# Patient Record
Sex: Male | Born: 1935 | Race: White | Hispanic: No | Marital: Married | Smoking: Never smoker
Health system: Southern US, Community
[De-identification: ages and names within clinical notes are randomized; demographics above are authoritative.]

## PROBLEM LIST (undated history)

## (undated) DIAGNOSIS — E785 Hyperlipidemia, unspecified: Secondary | ICD-10-CM

## (undated) DIAGNOSIS — M1990 Unspecified osteoarthritis, unspecified site: Secondary | ICD-10-CM

## (undated) DIAGNOSIS — R7881 Bacteremia: Secondary | ICD-10-CM

## (undated) DIAGNOSIS — I10 Essential (primary) hypertension: Secondary | ICD-10-CM

## (undated) DIAGNOSIS — M5090 Cervical disc disorder, unspecified, unspecified cervical region: Secondary | ICD-10-CM

## (undated) DIAGNOSIS — I351 Nonrheumatic aortic (valve) insufficiency: Secondary | ICD-10-CM

## (undated) DIAGNOSIS — I639 Cerebral infarction, unspecified: Secondary | ICD-10-CM

## (undated) DIAGNOSIS — N400 Enlarged prostate without lower urinary tract symptoms: Secondary | ICD-10-CM

## (undated) DIAGNOSIS — M5412 Radiculopathy, cervical region: Secondary | ICD-10-CM

## (undated) DIAGNOSIS — I63239 Cerebral infarction due to unspecified occlusion or stenosis of unspecified carotid arteries: Secondary | ICD-10-CM

## (undated) HISTORY — PX: CHOLECYSTECTOMY: SHX55

## (undated) HISTORY — DX: Essential (primary) hypertension: I10

## (undated) HISTORY — PX: KIDNEY STONE SURGERY: SHX686

## (undated) HISTORY — DX: Hyperlipidemia, unspecified: E78.5

## (undated) HISTORY — PX: BACK SURGERY: SHX140

## (undated) HISTORY — PX: EYE SURGERY: SHX253

## (undated) HISTORY — PX: CAROTID ENDARTERECTOMY: SUR193

---

## 2004-05-01 ENCOUNTER — Ambulatory Visit: Payer: Self-pay | Admitting: Family Medicine

## 2005-04-22 ENCOUNTER — Ambulatory Visit: Payer: Self-pay | Admitting: Family Medicine

## 2005-04-25 ENCOUNTER — Ambulatory Visit: Payer: Self-pay | Admitting: Family Medicine

## 2005-04-28 ENCOUNTER — Ambulatory Visit: Payer: Self-pay | Admitting: Family Medicine

## 2005-09-29 ENCOUNTER — Ambulatory Visit: Payer: Self-pay | Admitting: Family Medicine

## 2005-12-02 ENCOUNTER — Ambulatory Visit: Payer: Self-pay | Admitting: Family Medicine

## 2005-12-16 ENCOUNTER — Ambulatory Visit: Payer: Self-pay | Admitting: Family Medicine

## 2006-05-18 ENCOUNTER — Ambulatory Visit: Payer: Self-pay | Admitting: Family Medicine

## 2006-05-19 ENCOUNTER — Ambulatory Visit: Payer: Self-pay | Admitting: Family Medicine

## 2009-04-20 ENCOUNTER — Ambulatory Visit (HOSPITAL_COMMUNITY): Admission: RE | Admit: 2009-04-20 | Discharge: 2009-04-21 | Payer: Self-pay | Admitting: Neurosurgery

## 2010-01-15 ENCOUNTER — Emergency Department (HOSPITAL_COMMUNITY): Admission: EM | Admit: 2010-01-15 | Discharge: 2010-01-15 | Payer: Self-pay | Admitting: Emergency Medicine

## 2010-09-26 LAB — TYPE AND SCREEN
ABO/RH(D): O POS
Antibody Screen: NEGATIVE

## 2010-09-26 LAB — BASIC METABOLIC PANEL
BUN: 9 mg/dL (ref 6–23)
Calcium: 8.6 mg/dL (ref 8.4–10.5)
Creatinine, Ser: 0.83 mg/dL (ref 0.4–1.5)
GFR calc Af Amer: >60 mL/min (ref >60)
Glucose, Bld: 89 mg/dL (ref 70–99)
Potassium: 3.9 mEq/L (ref 3.5–5.1)

## 2010-09-26 LAB — CBC
Hemoglobin: 14.3 g/dL (ref 13.0–17.0)
MCHC: 34 g/dL (ref 30.0–36.0)
RBC: 4.44 MIL/uL (ref 4.22–5.81)

## 2010-09-26 LAB — DIFFERENTIAL
Basophils Relative: 0 % (ref 0–1)
Eosinophils Absolute: 0.1 10*3/uL (ref 0.0–0.7)
Lymphs Abs: 1.8 10*3/uL (ref 0.7–4.0)
Monocytes Absolute: 0.6 10*3/uL (ref 0.1–1.0)

## 2011-09-22 DIAGNOSIS — I779 Disorder of arteries and arterioles, unspecified: Secondary | ICD-10-CM | POA: Insufficient documentation

## 2012-08-06 ENCOUNTER — Encounter (HOSPITAL_COMMUNITY): Payer: Self-pay

## 2012-08-06 ENCOUNTER — Emergency Department (HOSPITAL_COMMUNITY): Payer: Medicare Other

## 2012-08-06 ENCOUNTER — Emergency Department (HOSPITAL_COMMUNITY)
Admission: EM | Admit: 2012-08-06 | Discharge: 2012-08-06 | Disposition: A | Discharge status: Home / Self Care | Payer: Medicare Other | Attending: Emergency Medicine | Admitting: Emergency Medicine

## 2012-08-06 DIAGNOSIS — R531 Weakness: Secondary | ICD-10-CM

## 2012-08-06 DIAGNOSIS — R509 Fever, unspecified: Secondary | ICD-10-CM | POA: Insufficient documentation

## 2012-08-06 DIAGNOSIS — D72829 Elevated white blood cell count, unspecified: Secondary | ICD-10-CM | POA: Insufficient documentation

## 2012-08-06 DIAGNOSIS — R5381 Other malaise: Secondary | ICD-10-CM | POA: Insufficient documentation

## 2012-08-06 DIAGNOSIS — Z8673 Personal history of transient ischemic attack (TIA), and cerebral infarction without residual deficits: Secondary | ICD-10-CM | POA: Insufficient documentation

## 2012-08-06 HISTORY — DX: Cerebral infarction, unspecified: I63.9

## 2012-08-06 LAB — URINALYSIS, ROUTINE W REFLEX MICROSCOPIC
Bilirubin Urine: NEGATIVE
Urobilinogen, UA: 0.2 mg/dL (ref 0.0–1.0)

## 2012-08-06 LAB — COMPREHENSIVE METABOLIC PANEL
Albumin: 4 g/dL (ref 3.5–5.2)
Alkaline Phosphatase: 66 U/L (ref 39–117)
CO2: 27 mEq/L (ref 19–32)
Calcium: 9.2 mg/dL (ref 8.4–10.5)
Chloride: 97 mEq/L (ref 96–112)
GFR calc Af Amer: 85 mL/min — ABNORMAL LOW (ref >90)
Glucose, Bld: 128 mg/dL — ABNORMAL HIGH (ref 70–99)
Potassium: 4.1 mEq/L (ref 3.5–5.1)

## 2012-08-06 LAB — CBC WITH DIFFERENTIAL/PLATELET
Basophils Absolute: 0 10*3/uL (ref 0.0–0.1)
Basophils Relative: 0 % (ref 0–1)
HCT: 41.8 % (ref 39.0–52.0)
MCH: 30.5 pg (ref 26.0–34.0)
MCHC: 33.3 g/dL (ref 30.0–36.0)
Monocytes Absolute: 1.2 10*3/uL — ABNORMAL HIGH (ref 0.1–1.0)
Platelets: 158 10*3/uL (ref 150–400)
RDW: 13 % (ref 11.5–15.5)

## 2012-08-06 LAB — URINE MICROSCOPIC-ADD ON

## 2012-08-06 MED ORDER — ACETAMINOPHEN 500 MG PO TABS
1000.0000 mg | ORAL_TABLET | Freq: Once | ORAL | Status: AC
  Administered 2012-08-06: 1000 mg via ORAL
  Filled 2012-08-06: qty 2

## 2012-08-06 MED ORDER — SODIUM CHLORIDE 0.9 % IV BOLUS (SEPSIS)
500.0000 mL | Freq: Once | INTRAVENOUS | Status: AC
  Administered 2012-08-06: 1000 mL via INTRAVENOUS

## 2012-08-06 NOTE — ED Notes (Signed)
Ambulated patient to the nurses desk and back. Patient needed little assistance.

## 2012-08-06 NOTE — ED Notes (Signed)
Pt tolerated p.o intake with no difficulty.  Reporting that he does feel slightly better after eating.  IV bolus continues to infuse.

## 2012-08-06 NOTE — ED Notes (Signed)
To be discharged, notable hard for patient to sit up in bed. EDP made aware patient to be ambulated around nurses station.

## 2012-08-06 NOTE — ED Provider Notes (Signed)
History     CSN: [id number]  Arrival date & time 08/06/12  1617   First MD Initiated Contact with Patient 08/06/12 1619      Chief Complaint  Patient presents with  . Weakness    (Consider location/radiation/quality/duration/timing/severity/associated sxs/prior treatment) HPI Comments: 79 year old male history of stroke and a carotid endarterectomy on the right who presents with a complaint of generalized weakness. He states that yesterday he was able to get out of the house and shovel snow and today when he woke up he was feeling a bit tired, when he got out of the house to go to the car with his wife he felt severely weak and lowered to the ground, unable to get up. He denies any focal weakness or numbness and has no difficulty with speech or with vision. The symptoms are persistent, moderate to severe, nothing seems to make it better or worse. He has had no food and no fluids today, he denies fevers chills, cough, chest pain, back pain, abdominal pain, dysuria, diarrhea, swelling, rashes. He does admit to having nausea today.  Patient is a 79 y.o. male presenting with weakness. The history is provided by the patient and the EMS personnel.  Weakness    Past Medical History  Diagnosis Date  . Stroke     Past Surgical History  Procedure Laterality Date  . Carotid endarterectomy      No family history on file.  History  Substance Use Topics  . Smoking status: Never Smoker   . Smokeless tobacco: Not on file  . Alcohol Use: No      Review of Systems  Neurological: Positive for weakness.  All other systems reviewed and are negative.    Allergies  Review of patient's allergies indicates not on file.  Home Medications   Current Outpatient Rx  Name  Route  Sig  Dispense  Refill  . acetaminophen (TYLENOL EX ST ARTHRITIS PAIN) 500 MG tablet   Oral   Take 500 mg by mouth daily as needed for pain.         . Tamsulosin HCl (FLOMAX) 0.4 MG CAPS   Oral   Take 0.4 mg  by mouth every evening.           BP 130/54  Pulse 108  Temp(Src) 99.6 F (37.6 C) (Oral)  Resp 18  Ht 5\' 8"  (1.727 m)  Wt 175 lb (79.379 kg)  BMI 26.61 kg/m2  SpO2 95%  Physical Exam  Nursing note and vitals reviewed. Constitutional: He appears well-developed and well-nourished. No distress.  HENT:  Head: Normocephalic and atraumatic.  Mouth/Throat: No oropharyngeal exudate.  Mucous membranes dry  Eyes: Conjunctivae and EOM are normal. Pupils are equal, round, and reactive to light. Right eye exhibits no discharge. Left eye exhibits no discharge. No scleral icterus.  Neck: Normal range of motion. Neck supple. No JVD present. No thyromegaly present.  Cardiovascular: Regular rhythm, normal heart sounds and intact distal pulses.  Exam reveals no gallop and no friction rub.   No murmur heard. Tachycardia  Pulmonary/Chest: Effort normal and breath sounds normal. No respiratory distress. He has no wheezes. He has no rales.  Abdominal: Soft. Bowel sounds are normal. He exhibits no distension and no mass. There is no tenderness.  Musculoskeletal: Normal range of motion. He exhibits no edema and no tenderness.  Lymphadenopathy:    He has no cervical adenopathy.  Neurological: He is alert. Coordination normal.  Cranial nerves III through XII are intact, speech is clear  and goal-directed, extraocular movements and pupillary exam is normal, peripheral visual fields are normal, there is no limb ataxia, movements or coordinated, strength in all 4 extremities is normal except on pronator drift testing where his left upper extremity has a slight weakness to it. Decreased reflexes at the bilateral knees, no weakness of the ankles knees or hips.  Skin: Skin is warm and dry. No rash noted. No erythema.  Psychiatric: He has a normal mood and affect. His behavior is normal.    ED Course  Procedures (including critical care time)  Labs Reviewed  CBC WITH DIFFERENTIAL - Abnormal; Notable for the  following:    WBC 13.1 (*)    Neutrophils Relative 87 (*)    Neutro Abs 11.4 (*)    Lymphocytes Relative 3 (*)    Lymphs Abs 0.4 (*)    Monocytes Absolute 1.2 (*)    All other components within normal limits  COMPREHENSIVE METABOLIC PANEL - Abnormal; Notable for the following:    Glucose, Bld 128 (*)    GFR calc non Af Amer 74 (*)    GFR calc Af Amer 85 (*)    All other components within normal limits  URINALYSIS, ROUTINE W REFLEX MICROSCOPIC - Abnormal; Notable for the following:    Hgb urine dipstick SMALL (*)    Ketones, ur TRACE (*)    All other components within normal limits  TROPONIN I  URINE MICROSCOPIC-ADD ON   Mr Brain Wo Contrast  08/06/2012  *RADIOLOGY REPORT*  Clinical Data: Generalized weakness, worse on the left.  Confusion.  MRI HEAD WITHOUT CONTRAST  Technique:  Multiplanar, multiecho pulse sequences of the brain and surrounding structures were obtained according to standard protocol without intravenous contrast.  Comparison: CT head 01/15/2010  Findings: The patient had difficulty remaining motionless for the study.  Images are suboptimal.  Small or subtle lesions could be overlooked.  There is no evidence for acute infarction, intracranial hemorrhage, mass lesion, hydrocephalus, or extra-axial fluid.  Moderate atrophy is present.  Chronic microvascular ischemic changes are noted in the periventricular and subcortical white matter.  Flow voids are maintained in the major intracranial vessels.  There are no foci of chronic hemorrhage.  There is no acute osseous findings.    There is moderate fluid accumulation in the left frontal sinus of indeterminate age.  Upper cervical region unremarkable except for mild pannus.  IMPRESSION: Motion degraded exam.  Moderate atrophy with chronic microvascular ischemic change.  No visible acute stroke or intracranial hemorrhage.   Original Report Authenticated By: Davonna Belling, M.D.    Dg Chest Port 1 View  08/06/2012  *RADIOLOGY REPORT*   Clinical Data: Shortness of breath.  PORTABLE CHEST - 1 VIEW  Comparison: 01/15/2010.  Findings: There is apical lordotic positioning.  Cardiac silhouette is borderline in size. Ectasia, tortuosity, and nonaneurysmal calcification of the thoracic aorta are seen. There is chronic elevation of the right hemidiaphragm.  No pulmonary edema, pneumonia, or pleural effusion is seen.  There are osteophytes in the spine.  IMPRESSION: Borderline cardiac size with no evidence of pulmonary edema, pneumonia, or pleural effusion.   Original Report Authenticated By: Onalee Hua Call      1. Fever   2. Generalized weakness   3. Leukocytosis       MDM  At this time the patient is not have any focal weakness other than a slight left upper extremity weakness. He states that he was so severely weak he couldn't even walk to the car which  is very abnormal for him. He has no other findings on his exam other than tachycardia and appears dehydrated, IV fluids, labs, chest x-ray, EKG, CT scan is not working at this hospital at this time, MRI ordered to r/o acute CVA - Onset of sx was just prior to arrival.  ED ECG REPORT  I personally interpreted this EKG   Date: 08/06/2012   Rate: 117  Rhythm: sinus tachycardia  QRS Axis: left  Intervals: normal  ST/T Wave abnormalities: nonspecific T wave changes  Conduction Disutrbances:none  Narrative Interpretation:   Old EKG Reviewed: Compared with 04/17/2009, rate no increased, nonspecific T waves now seen, no other significant changes   The patient has tolerated oral fluids and food, his laboratory workup shows a leukocytosis but normal urinalysis, normal metabolic panel, normal chest x-ray without signs of infiltrates and no signs of urinary tract infection. An MRI of the brain shows no signs of acute stroke and the patient continues to have mild weakness. On exam he has no asymmetry with normal strength of the upper extremities and lower extremities, normal mental status, he  persists to have a normal level of alertness and orientation. He has developed a low-grade fever to 101.1, he has had a slight tachycardia to 110, but on repeat exam has clear lungs, soft abdomen, no signs of rashes, no signs of sinusitis, very supple neck and no headache. He does endorse having a flu shot late fall. He does have some generalized myalgias at this time.    The patient has not eaten, no nausea or vomiting, states that he feels much better after getting IV fluids and wants to be discharged home. I do not find anything on exam that would require inpatient admission, he understands indications for return and appears stable at this time. He has a febrile illness of unknown origin and I requested that he follow up with his doctor first thing Monday or return to the hospital for severe or worsening symptoms.  Vida Roller, MD 08/06/12 2109

## 2012-08-06 NOTE — ED Notes (Signed)
Weakness since this am, shoveled snow yesterday and feels tired ever since. Denies any cp. "just sore all over"

## 2012-08-08 ENCOUNTER — Emergency Department (HOSPITAL_COMMUNITY): Payer: Medicare Other

## 2012-08-08 ENCOUNTER — Encounter (HOSPITAL_COMMUNITY): Payer: Self-pay | Admitting: Emergency Medicine

## 2012-08-08 ENCOUNTER — Inpatient Hospital Stay (HOSPITAL_COMMUNITY)
Admission: EM | Admit: 2012-08-08 | Discharge: 2012-08-14 | DRG: 872 | Disposition: A | Discharge status: Skilled Nursing Facility | Payer: Medicare Other | Attending: Internal Medicine | Admitting: Internal Medicine

## 2012-08-08 DIAGNOSIS — M5137 Other intervertebral disc degeneration, lumbosacral region: Secondary | ICD-10-CM | POA: Diagnosis present

## 2012-08-08 DIAGNOSIS — R509 Fever, unspecified: Secondary | ICD-10-CM | POA: Diagnosis present

## 2012-08-08 DIAGNOSIS — K5900 Constipation, unspecified: Secondary | ICD-10-CM | POA: Diagnosis present

## 2012-08-08 DIAGNOSIS — A4902 Methicillin resistant Staphylococcus aureus infection, unspecified site: Secondary | ICD-10-CM | POA: Diagnosis present

## 2012-08-08 DIAGNOSIS — R531 Weakness: Secondary | ICD-10-CM

## 2012-08-08 DIAGNOSIS — M25511 Pain in right shoulder: Secondary | ICD-10-CM | POA: Diagnosis present

## 2012-08-08 DIAGNOSIS — R7881 Bacteremia: Principal | ICD-10-CM | POA: Diagnosis present

## 2012-08-08 DIAGNOSIS — R5381 Other malaise: Secondary | ICD-10-CM | POA: Diagnosis present

## 2012-08-08 DIAGNOSIS — M549 Dorsalgia, unspecified: Secondary | ICD-10-CM | POA: Diagnosis present

## 2012-08-08 DIAGNOSIS — M51379 Other intervertebral disc degeneration, lumbosacral region without mention of lumbar back pain or lower extremity pain: Secondary | ICD-10-CM | POA: Diagnosis present

## 2012-08-08 DIAGNOSIS — M19019 Primary osteoarthritis, unspecified shoulder: Secondary | ICD-10-CM | POA: Diagnosis present

## 2012-08-08 DIAGNOSIS — B9561 Methicillin susceptible Staphylococcus aureus infection as the cause of diseases classified elsewhere: Secondary | ICD-10-CM | POA: Diagnosis present

## 2012-08-08 DIAGNOSIS — R32 Unspecified urinary incontinence: Secondary | ICD-10-CM

## 2012-08-08 DIAGNOSIS — R5383 Other fatigue: Secondary | ICD-10-CM

## 2012-08-08 DIAGNOSIS — Z8673 Personal history of transient ischemic attack (TIA), and cerebral infarction without residual deficits: Secondary | ICD-10-CM

## 2012-08-08 DIAGNOSIS — N390 Urinary tract infection, site not specified: Secondary | ICD-10-CM

## 2012-08-08 DIAGNOSIS — N400 Enlarged prostate without lower urinary tract symptoms: Secondary | ICD-10-CM | POA: Diagnosis present

## 2012-08-08 HISTORY — DX: Unspecified osteoarthritis, unspecified site: M19.90

## 2012-08-08 LAB — URINALYSIS, ROUTINE W REFLEX MICROSCOPIC
Leukocytes, UA: NEGATIVE
Nitrite: NEGATIVE
Specific Gravity, Urine: 1.02 (ref 1.005–1.030)
pH: 6.5 (ref 5.0–8.0)

## 2012-08-08 LAB — CBC WITH DIFFERENTIAL/PLATELET
Basophils Relative: 0 % (ref 0–1)
HCT: 37.4 % — ABNORMAL LOW (ref 39.0–52.0)
Hemoglobin: 12.4 g/dL — ABNORMAL LOW (ref 13.0–17.0)
MCH: 30.9 pg (ref 26.0–34.0)
MCHC: 33.2 g/dL (ref 30.0–36.0)
Monocytes Absolute: 1.1 10*3/uL — ABNORMAL HIGH (ref 0.1–1.0)
Monocytes Relative: 8 % (ref 3–12)
Neutro Abs: 12.4 10*3/uL — ABNORMAL HIGH (ref 1.7–7.7)

## 2012-08-08 LAB — GLUCOSE, CAPILLARY

## 2012-08-08 LAB — COMPREHENSIVE METABOLIC PANEL
Albumin: 3.4 g/dL — ABNORMAL LOW (ref 3.5–5.2)
BUN: 14 mg/dL (ref 6–23)
CO2: 27 mEq/L (ref 19–32)
Chloride: 96 mEq/L (ref 96–112)
Creatinine, Ser: 1.08 mg/dL (ref 0.50–1.35)
GFR calc Af Amer: 71 mL/min — ABNORMAL LOW (ref >90)
GFR calc non Af Amer: 61 mL/min — ABNORMAL LOW (ref >90)
Total Bilirubin: 1.3 mg/dL — ABNORMAL HIGH (ref 0.3–1.2)

## 2012-08-08 LAB — CK: Total CK: 340 U/L — ABNORMAL HIGH (ref 7–232)

## 2012-08-08 LAB — URINE MICROSCOPIC-ADD ON

## 2012-08-08 MED ORDER — KETOROLAC TROMETHAMINE 30 MG/ML IJ SOLN
30.0000 mg | Freq: Once | INTRAMUSCULAR | Status: AC
  Administered 2012-08-08: 30 mg via INTRAVENOUS
  Filled 2012-08-08: qty 1

## 2012-08-08 MED ORDER — ASPIRIN 325 MG PO TABS
325.0000 mg | ORAL_TABLET | Freq: Every day | ORAL | Status: DC
  Administered 2012-08-08 – 2012-08-14 (×7): 325 mg via ORAL
  Filled 2012-08-08 (×7): qty 1

## 2012-08-08 MED ORDER — DEXTROSE 5 % IV SOLN
1.0000 g | INTRAVENOUS | Status: DC
  Filled 2012-08-08: qty 10

## 2012-08-08 MED ORDER — ACETAMINOPHEN 500 MG PO TABS
1000.0000 mg | ORAL_TABLET | Freq: Four times a day (QID) | ORAL | Status: DC | PRN
  Administered 2012-08-08 – 2012-08-12 (×6): 1000 mg via ORAL
  Filled 2012-08-08 (×6): qty 2

## 2012-08-08 MED ORDER — TAMSULOSIN HCL 0.4 MG PO CAPS
0.4000 mg | ORAL_CAPSULE | Freq: Every evening | ORAL | Status: DC
  Administered 2012-08-10 – 2012-08-13 (×4): 0.4 mg via ORAL
  Filled 2012-08-08 (×5): qty 1

## 2012-08-08 MED ORDER — DEXTROSE 5 % IV SOLN: 1.0000 g | INTRAVENOUS | Status: DC

## 2012-08-08 MED ORDER — ONDANSETRON HCL 4 MG/2ML IJ SOLN: 4.0000 mg | Freq: Four times a day (QID) | INTRAMUSCULAR | Status: DC | PRN

## 2012-08-08 MED ORDER — ACETAMINOPHEN 500 MG PO TABS
1000.0000 mg | ORAL_TABLET | Freq: Once | ORAL | Status: AC
  Administered 2012-08-08: 1000 mg via ORAL

## 2012-08-08 MED ORDER — HEPARIN SODIUM (PORCINE) 5000 UNIT/ML IJ SOLN
5000.0000 [IU] | Freq: Three times a day (TID) | INTRAMUSCULAR | Status: DC
  Administered 2012-08-08 – 2012-08-14 (×19): 5000 [IU] via SUBCUTANEOUS
  Filled 2012-08-08 (×19): qty 1

## 2012-08-08 MED ORDER — OXYCODONE HCL 5 MG PO TABS
5.0000 mg | ORAL_TABLET | ORAL | Status: DC | PRN
  Administered 2012-08-08 – 2012-08-12 (×12): 5 mg via ORAL
  Filled 2012-08-08 (×13): qty 1

## 2012-08-08 MED ORDER — DEXTROSE 5 % IV SOLN
1.0000 g | Freq: Once | INTRAVENOUS | Status: AC
  Administered 2012-08-08: 1 g via INTRAVENOUS
  Filled 2012-08-08: qty 10

## 2012-08-08 MED ORDER — METHOCARBAMOL 500 MG PO TABS
750.0000 mg | ORAL_TABLET | Freq: Once | ORAL | Status: AC
  Administered 2012-08-08: 750 mg via ORAL
  Filled 2012-08-08: qty 2

## 2012-08-08 MED ORDER — ACETAMINOPHEN 500 MG PO TABS
ORAL_TABLET | ORAL | Status: AC
  Administered 2012-08-08: 1000 mg via ORAL
  Filled 2012-08-08: qty 2

## 2012-08-08 MED ORDER — SODIUM CHLORIDE 0.9 % IV SOLN
INTRAVENOUS | Status: DC
  Administered 2012-08-08 – 2012-08-13 (×6): via INTRAVENOUS

## 2012-08-08 MED ORDER — ALPRAZOLAM 0.5 MG PO TABS
0.5000 mg | ORAL_TABLET | Freq: Every evening | ORAL | Status: DC | PRN
  Administered 2012-08-08 – 2012-08-11 (×2): 0.5 mg via ORAL
  Filled 2012-08-08 (×3): qty 1

## 2012-08-08 MED ORDER — ONDANSETRON HCL 4 MG PO TABS: 4.0000 mg | ORAL_TABLET | Freq: Four times a day (QID) | ORAL | Status: DC | PRN

## 2012-08-08 NOTE — ED Notes (Signed)
Report given to [place], RN unit 300. Ready to receive patient.

## 2012-08-08 NOTE — H&P (Signed)
[facility] History and Physical  Paul Bradshaw ZOX:[id number] DOB: 01/20/1926 DOA: 08/08/2012      Chief Complaint: Paul Bradshaw  HPI: Paul Bradshaw is a 79 y.o. male who presents with 2 day history of weakness associated with chills and subjective fever.Also describes urinary incontinence,no dysuria,no hematuria.Found to have fever in ER.No cough,dyspnoea,abdo pain.No n/v.Has taken flu shot.   Review of Systems:  Apart from HPI,other systems negative.  Past Medical History  Diagnosis Date  . Stroke   . Renal disorder   . Arthritis    Past Surgical History  Procedure Laterality Date  . Carotid endarterectomy    . Kidney stone surgery     Social History:  Married,non-smoker,no alcohol.  No Known Allergies  History reviewed. No pertinent family history. Non contributory.   Prior to Admission medications   Medication Sig Start Date End Date Taking? Authorizing Provider  acetaminophen (TYLENOL) 500 MG tablet Take 1,000 mg by mouth every 6 (six) hours as needed for pain.   Yes Historical Provider, MD  aspirin 325 MG tablet Take 325 mg by mouth daily.   Yes Historical Provider, MD  Tamsulosin HCl (FLOMAX) 0.4 MG CAPS Take 0.4 mg by mouth every evening.   Yes Historical Provider, MD   Physical Exam: Filed Vitals:   08/08/12 1012 08/08/12 1115 08/08/12 1157 08/08/12 1203  BP: 129/66  110/93   Pulse: 107  101   Temp: 99.7 F (37.6 C) 102.8 F (39.3 C)  103 F (39.4 C)  TempSrc:  Rectal  Rectal  Resp: 20  18   Height: 5\' 5"  (1.651 m)     Weight: 79.379 kg (175 lb)     SpO2: 98%  97%      General:  Feels warm but not toxic.  Eyes: No pallor,no jaundice.  ENT: WNL  Neck: No lymphadenopathy  Cardiovascular: No murmurs  Respiratory: Clear  Abdomen: Soft,non-tender.  Skin: No rash.  Musculoskeletal: WNL  Psychiatric: Appropriate affect.  Neurologic: No focal signs.  Labs on Admission:  Basic Metabolic Panel:  Recent Labs Lab 08/06/12 1624  08/08/12 1101  NA 136 133*  K 4.1 3.9  CL 97 96  CO2 27 27  GLUCOSE 128* 128*  BUN 15 14  CREATININE 0.97 1.08  CALCIUM 9.2 8.8   Liver Function Tests:  Recent Labs Lab 08/06/12 1624 08/08/12 1101  AST 17 37  ALT 9 15  ALKPHOS 66 69  BILITOT 1.0 1.3*  PROT 7.2 7.0  ALBUMIN 4.0 3.4*     CBC:  Recent Labs Lab 08/06/12 1624 08/08/12 1101  WBC 13.1* 14.4*  NEUTROABS 11.4* 12.4*  HGB 13.9 12.4*  HCT 41.8 37.4*  MCV 91.9 93.3  PLT 158 131*   Cardiac Enzymes:  Recent Labs Lab 08/06/12 1624 08/08/12 1101  CKTOTAL  --  340*  TROPONINI <0.30  --        Radiological Exams on Admission: Mr Brain Wo Contrast  08/06/2012  *RADIOLOGY REPORT*  Clinical Data: Generalized weakness, worse on the left.  Confusion.  MRI HEAD WITHOUT CONTRAST  Technique:  Multiplanar, multiecho pulse sequences of the brain and surrounding structures were obtained according to standard protocol without intravenous contrast.  Comparison: CT head 01/15/2010  Findings: The patient had difficulty remaining motionless for the study.  Images are suboptimal.  Small or subtle lesions could be overlooked.  There is no evidence for acute infarction, intracranial hemorrhage, mass lesion, hydrocephalus, or extra-axial fluid.  Moderate atrophy is present.  Chronic microvascular ischemic changes  are noted in the periventricular and subcortical white matter.  Flow voids are maintained in the major intracranial vessels.  There are no foci of chronic hemorrhage.  There is no acute osseous findings.    There is moderate fluid accumulation in the left frontal sinus of indeterminate age.  Upper cervical region unremarkable except for mild pannus.  IMPRESSION: Motion degraded exam.  Moderate atrophy with chronic microvascular ischemic change.  No visible acute stroke or intracranial hemorrhage.   Original Report Authenticated By: Davonna Belling, M.D.    Dg Chest Portable 1 View  08/08/2012  *RADIOLOGY REPORT*  Clinical  Data: Weakness and pain.  PORTABLE CHEST - 1 VIEW  Comparison: 08/06/2012 and prior chest radiographs  Findings: The cardiomediastinal silhouette is unremarkable. There is no evidence of focal airspace disease, pulmonary edema, suspicious pulmonary nodule/mass, pleural effusion, or pneumothorax. No acute bony abnormalities are identified.  IMPRESSION: No evidence of acute cardiopulmonary disease.   Original Report Authenticated By: Harmon Pier, M.D.    Dg Chest Port 1 View  08/06/2012  *RADIOLOGY REPORT*  Clinical Data: Shortness of breath.  PORTABLE CHEST - 1 VIEW  Comparison: 01/15/2010.  Findings: There is apical lordotic positioning.  Cardiac silhouette is borderline in size. Ectasia, tortuosity, and nonaneurysmal calcification of the thoracic aorta are seen. There is chronic elevation of the right hemidiaphragm.  No pulmonary edema, pneumonia, or pleural effusion is seen.  There are osteophytes in the spine.  IMPRESSION: Borderline cardiac size with no evidence of pulmonary edema, pneumonia, or pleural effusion.   Original Report Authenticated By: Onalee Hua Call       Assessment/Plan Active Problems:   Fever   1. Fever,    ?etiology.Suspect Influenza versus Prostatitis or UTI.  Plan: 1.Admit 2.Influenza test by PCR 3.Blood cultures,urine cultures. 4.Start iv antibiotics.  Code Status: Full Code   Family Communication: Discussed plan with patient at bedside.   Disposition Plan: Home when medically stable   Time spent: 45 mins  Wilson Singer [facility] Pager [phone]  If 7PM-7AM, please contact night-coverage [email or web address] Password [facility] 08/08/2012, 12:38 PM

## 2012-08-08 NOTE — ED Provider Notes (Signed)
History    This chart was scribed for Ward Givens, MD by Melba Coon, ED Scribe. The patient was seen in room APA04/APA04 and the patient's care was started at 10:39AM.    CSN: [id number]  Arrival date & time 08/08/12  1002   None     Chief Complaint  Patient presents with  . Weakness  . Shoulder Pain    (Consider location/radiation/quality/duration/timing/severity/associated sxs/prior treatment) The history is provided by the patient and the spouse. No language interpreter was used.   Paul Bradshaw is a 79 y.o. male who presents to the Emergency Department complaining of persistent, moderate generalized weakness with an onset 3 days ago that has worsened since yesterday. He has a past history of a stroke about 7-8 years ago that affected his right arm but reports that he is back to baseline behavior. Spouse reports he has been feeling weak with malaise after shoveling snow for about an hour 3 days ago. That same evening, he reported decreased appetite which has been constant ever since. The next morning (2 days ago), he fell in the bathroom without head contact or LOC; he reports he tried to get off of the commode but his knees felt weak and he slid down a smooth surface.  His grandson had to help him up off the floor. EMS came and took him to the ED 2 days ago; he was evaluated and discharged and was told to take tylenol at home. Later that night, he ambulated to the bathroom on his own twice without any problems. However, yesterday morning, he started having urinary incontinence and voided on the bathroom floor and bed because he was too weak to get to the bathroom.. This morning, he still felt weak with decreased appetite with good fluid intake. He has tremors with cold intolerance since yesterday. He reports sweats (temp of 99.7 at home). He reports mild cough; he reports it overall hurts when he coughs. He has diffuse body aches. But reports bilateral hip pain and right lower back pain  is the worse (reports history of back surgery). Denies HA, sore throat, rash, back pain, CP, coughing, SOB, abdominal pain, nausea, emesis, diarrhea, dysuria, or extremity edema, weakness, numbness, or tingling. He takes ASA and tylenol for arthritis. No known allergies. No other pertinent medical symptoms.  Patient states that he states he has the urge to urinate  however he can't hold it and he has incontinence. He also complains of feeling cold with chills since yesterday.  PCP: Dr. Lysbeth Galas  Past Medical History  Diagnosis Date  . Stroke   . Renal disorder   . Arthritis     Past Surgical History  Procedure Laterality Date  . Carotid endarterectomy    . Kidney stone surgery      History reviewed. No pertinent family history.  History  Substance Use Topics  . Smoking status: Never Smoker   . Smokeless tobacco: Not on file  . Alcohol Use: No   Lives at home Lives with spouse   Review of Systems 10 Systems reviewed and all are negative for acute change except as noted in the HPI.   Allergies  Review of patient's allergies indicates no known allergies.  Home Medications   Current Outpatient Rx  Name  Route  Sig  Dispense  Refill  . acetaminophen (TYLENOL) 500 MG tablet   Oral   Take 1,000 mg by mouth every 6 (six) hours as needed for pain.         Marland Kitchen  aspirin 325 MG tablet   Oral   Take 325 mg by mouth daily.         . Tamsulosin HCl (FLOMAX) 0.4 MG CAPS   Oral   Take 0.4 mg by mouth every evening.           BP 129/66  Pulse 107  Temp(Src) 99.7 F (38.8 C) (Oral)  Resp 18  Ht 5\' 5"  (1.651 m)  Wt 175 lb (79.379 kg)  BMI 29.12 kg/m2  SpO2 95%  Vital signs normal except tachycardia low-grade fever   Physical Exam  Nursing note and vitals reviewed. Constitutional: He is oriented to person, place, and time. He appears well-developed and well-nourished.  Non-toxic appearance. He does not appear ill. No distress.  Patient having Chills during exam.   HENT:  Head: Normocephalic and atraumatic.  Right Ear: External ear normal.  Left Ear: External ear normal.  Nose: Nose normal. No mucosal edema or rhinorrhea.  Mouth/Throat: No dental abscesses or edematous.  Oropharynx clear but tongue is dry.  Eyes: Conjunctivae and EOM are normal. Pupils are equal, round, and reactive to light.  Neck: Normal range of motion and full passive range of motion without pain. Neck supple.  Cardiovascular: Normal rate, regular rhythm and normal heart sounds.  Exam reveals no gallop and no friction rub.   No murmur heard. Pulmonary/Chest: Effort normal and breath sounds normal. No respiratory distress. He has no wheezes. He has no rhonchi. He has no rales. He exhibits no tenderness and no crepitus.  Abdominal: Soft. Normal appearance and bowel sounds are normal. He exhibits no distension. There is no tenderness. There is no rebound and no guarding.  Musculoskeletal: Normal range of motion. He exhibits no edema and no tenderness.  Moves all extremities well. Tender to thigh muscles of right leg with flxion of knee. pain diffusely in lowe back in buttocks  Neurological: He is alert and oriented to person, place, and time. He has normal strength. No cranial nerve deficit.  Skin: Skin is warm, dry and intact. No rash noted. No erythema. No pallor.  Skin is hot to touch  Psychiatric: He has a normal mood and affect. His speech is normal and behavior is normal. His mood appears not anxious.    ED Course  Procedures (including critical care time)  Medications  0.9 %  sodium chloride infusion ( Intravenous New Bag/Given 08/08/12 1110)  cefTRIAXone (ROCEPHIN) 1 g in dextrose 5 % 50 mL IVPB (not administered)  acetaminophen (TYLENOL) tablet 1,000 mg (0 mg Oral Duplicate 08/08/12 1131)  methocarbamol (ROBAXIN) tablet 750 mg (750 mg Oral Given 08/08/12 1153)  ketorolac (TORADOL) 30 MG/ML injection 30 mg (30 mg Intravenous Given 08/08/12 1153)  cefTRIAXone (ROCEPHIN) 1 g  in dextrose 5 % 50 mL IVPB (1 g Intravenous New Bag/Given 08/08/12 1153)     DIAGNOSTIC STUDIES: Oxygen Saturation is 98% on room air, normal by my interpretation.    COORDINATION OF CARE:  10:50AM - robaxin, Toradol, rocephin, Tylenol, IV fluids, CXR, UA, CK, CBC with differential, CMP, and blood culture will be ordered for Claire L Viney.   I had nurses do a rectal temperature as patient felt hot during my exam. He did indeed have a fever and it rose to 103. His fever was treated. After reviewing his x-ray and laboratory results he was given IV antibiotics for suspected urinary tract infection.   11:53AM - recheck; lab results and imaging results reviewed. Consult to internal medicine will be performed. His condition  is mildly improved; more Zofran and Tylenol will be ordered. He will be admitted.  12:01 PM patient discussed with Dr. Karilyn Cota who is going to see in ED for admission.   Results for orders placed during the hospital encounter of 08/08/12  CULTURE, BLOOD (ROUTINE X 2)      Result Value Range   Specimen Description BLOOD RIGHT ARM DRAWN BY RN     Special Requests BOTTLES DRAWN AEROBIC ONLY 8CC BOTTLE     Culture PENDING     Report Status PENDING    CULTURE, BLOOD (ROUTINE X 2)      Result Value Range   Specimen Description BLOOD LEFT ARM     Special Requests       Value: BOTTLES DRAWN AEROBIC AND ANAEROBIC 8CC EACH BOTTLE   Culture PENDING     Report Status PENDING    URINALYSIS, ROUTINE W REFLEX MICROSCOPIC      Result Value Range   Color, Urine YELLOW  YELLOW   APPearance CLEAR  CLEAR   Specific Gravity, Urine 1.020  1.005 - 1.030   pH 6.5  5.0 - 8.0   Glucose, UA NEGATIVE  NEGATIVE mg/dL   Hgb urine dipstick LARGE (*) NEGATIVE   Bilirubin Urine SMALL (*) NEGATIVE   Ketones, ur 15 (*) NEGATIVE mg/dL   Protein, ur 147 (*) NEGATIVE mg/dL   Urobilinogen, UA 2.0 (*) 0.0 - 1.0 mg/dL   Nitrite NEGATIVE  NEGATIVE   Leukocytes, UA NEGATIVE  NEGATIVE  CK      Result  Value Range   Total CK 340 (*) 7 - 232 U/L  CBC WITH DIFFERENTIAL      Result Value Range   WBC 14.4 (*) 4.0 - 10.5 K/uL   RBC 4.01 (*) 4.22 - 5.81 MIL/uL   Hemoglobin 12.4 (*) 13.0 - 17.0 g/dL   HCT 82.9 (*) 56.2 - 13.0 %   MCV 93.3  78.0 - 100.0 fL   MCH 30.9  26.0 - 34.0 pg   MCHC 33.2  30.0 - 36.0 g/dL   RDW 86.5  78.4 - 69.6 %   Platelets 131 (*) 150 - 400 K/uL   Neutrophils Relative 86 (*) 43 - 77 %   Neutro Abs 12.4 (*) 1.7 - 7.7 K/uL   Lymphocytes Relative 6 (*) 12 - 46 %   Lymphs Abs 0.9  0.7 - 4.0 K/uL   Monocytes Relative 8  3 - 12 %   Monocytes Absolute 1.1 (*) 0.1 - 1.0 K/uL   Eosinophils Relative 0  0 - 5 %   Eosinophils Absolute 0.0  0.0 - 0.7 K/uL   Basophils Relative 0  0 - 1 %   Basophils Absolute 0.0  0.0 - 0.1 K/uL  COMPREHENSIVE METABOLIC PANEL      Result Value Range   Sodium 133 (*) 135 - 145 mEq/L   Potassium 3.9  3.5 - 5.1 mEq/L   Chloride 96  96 - 112 mEq/L   CO2 27  19 - 32 mEq/L   Glucose, Bld 128 (*) 70 - 99 mg/dL   BUN 14  6 - 23 mg/dL   Creatinine, Ser 2.95  0.50 - 1.35 mg/dL   Calcium 8.8  8.4 - 28.4 mg/dL   Total Protein 7.0  6.0 - 8.3 g/dL   Albumin 3.4 (*) 3.5 - 5.2 g/dL   AST 37  0 - 37 U/L   ALT 15  0 - 53 U/L   Alkaline Phosphatase 69  39 -  117 U/L   Total Bilirubin 1.3 (*) 0.3 - 1.2 mg/dL   GFR calc non Af Amer 61 (*) >90 mL/min   GFR calc Af Amer 71 (*) >90 mL/min  URINE MICROSCOPIC-ADD ON      Result Value Range   WBC, UA 7-10  <3 WBC/hpf   RBC / HPF 11-20  <3 RBC/hpf   Bacteria, UA RARE  RARE   Casts GRANULAR CAST (*) NEGATIVE  INFLUENZA PANEL BY PCR      Result Value Range   Influenza A By PCR NEGATIVE  NEGATIVE   Influenza B By PCR NEGATIVE  NEGATIVE   H1N1 flu by pcr NOT DETECTED  NOT DETECTED    Laboratory interpretation all normal except for leukocytosis, possible UTI, mildly elevated CK not consistent with rhabdomyolysis   Dg Chest Portable 1 View  08/08/2012  *RADIOLOGY REPORT*  Clinical Data: Weakness and  pain.  PORTABLE CHEST - 1 VIEW  Comparison: 08/06/2012 and prior chest radiographs  Findings: The cardiomediastinal silhouette is unremarkable. There is no evidence of focal airspace disease, pulmonary edema, suspicious pulmonary nodule/mass, pleural effusion, or pneumothorax. No acute bony abnormalities are identified.  IMPRESSION: No evidence of acute cardiopulmonary disease.   Original Report Authenticated By: Harmon Pier, M.D.    Dg Chest Port 1 View  08/06/2012  *RADIOLOGY REPORT*  Clinical Data: Shortness of breath.  PORTABLE CHEST - 1 VIEW  Comparison: 01/15/2010.  Findings: There is apical lordotic positioning.  Cardiac silhouette is borderline in size. Ectasia, tortuosity, and nonaneurysmal calcification of the thoracic aorta are seen. There is chronic elevation of the right hemidiaphragm.  No pulmonary edema, pneumonia, or pleural effusion is seen.  There are osteophytes in the spine.  IMPRESSION: Borderline cardiac size with no evidence of pulmonary edema, pneumonia, or pleural effusion.   Original Report Authenticated By: Onalee Hua Call    Mr Brain Wo Contrast  08/06/2012  *  IMPRESSION: Motion degraded exam.  Moderate atrophy with chronic microvascular ischemic change.  No visible acute stroke or intracranial hemorrhage.   Original Report Authenticated By: Davonna Belling, M.D.       1. Weakness   2. Fever and chills   3. Urinary incontinence   4. UTI (lower urinary tract infection)     Plan admission  Devoria Albe, MD, FACEP   MDM   I personally performed the services described in this documentation, which was scribed in my presence. The recorded information has been reviewed and considered.  Devoria Albe, MD, Armando Gang        Ward Givens, MD 08/08/12 [phone]

## 2012-08-08 NOTE — ED Notes (Signed)
Pt states he was here Friday for same, all over weakness and sore from fall Friday. Pt states he is no better "maybe a little worse" and only change since Friday's visit is now urinating on self. States he does not feel it. Pt is alert/oriented. States he fell on r side x 2 days ago. C/o pain to r side of neck and into shoulder. Pt states he is sore everywhere. Denies n/v/d. Has only been able to walk to restroom  Since Friday. Pt needed a lot of help from w/c to stretcher. C/o chills since this am

## 2012-08-09 ENCOUNTER — Encounter (HOSPITAL_COMMUNITY): Payer: Self-pay | Admitting: Radiology

## 2012-08-09 ENCOUNTER — Inpatient Hospital Stay (HOSPITAL_COMMUNITY): Payer: Medicare Other

## 2012-08-09 DIAGNOSIS — R7881 Bacteremia: Principal | ICD-10-CM

## 2012-08-09 DIAGNOSIS — B9561 Methicillin susceptible Staphylococcus aureus infection as the cause of diseases classified elsewhere: Secondary | ICD-10-CM

## 2012-08-09 DIAGNOSIS — A4901 Methicillin susceptible Staphylococcus aureus infection, unspecified site: Secondary | ICD-10-CM

## 2012-08-09 DIAGNOSIS — M549 Dorsalgia, unspecified: Secondary | ICD-10-CM | POA: Diagnosis present

## 2012-08-09 DIAGNOSIS — R32 Unspecified urinary incontinence: Secondary | ICD-10-CM

## 2012-08-09 HISTORY — DX: Methicillin susceptible Staphylococcus aureus infection as the cause of diseases classified elsewhere: B95.61

## 2012-08-09 HISTORY — DX: Bacteremia: R78.81

## 2012-08-09 LAB — COMPREHENSIVE METABOLIC PANEL
AST: 36 U/L (ref 0–37)
Albumin: 3 g/dL — ABNORMAL LOW (ref 3.5–5.2)
Alkaline Phosphatase: 69 U/L (ref 39–117)
Chloride: 99 mEq/L (ref 96–112)
Potassium: 3.9 mEq/L (ref 3.5–5.1)
Total Bilirubin: 0.9 mg/dL (ref 0.3–1.2)

## 2012-08-09 LAB — CBC
HCT: 36.6 % — ABNORMAL LOW (ref 39.0–52.0)
MCH: 30.8 pg (ref 26.0–34.0)
MCV: 93.8 fL (ref 78.0–100.0)
Platelets: 126 10*3/uL — ABNORMAL LOW (ref 150–400)
RBC: 3.9 MIL/uL — ABNORMAL LOW (ref 4.22–5.81)

## 2012-08-09 MED ORDER — VANCOMYCIN HCL 1000 MG IV SOLR
750.0000 mg | Freq: Once | INTRAVENOUS | Status: AC
  Administered 2012-08-09: 750 mg via INTRAVENOUS
  Filled 2012-08-09: qty 750

## 2012-08-09 MED ORDER — MUPIROCIN 2 % EX OINT
1.0000 "application " | TOPICAL_OINTMENT | Freq: Two times a day (BID) | CUTANEOUS | Status: DC
  Administered 2012-08-09 – 2012-08-11 (×6): 1 via NASAL
  Filled 2012-08-09: qty 22

## 2012-08-09 MED ORDER — VANCOMYCIN HCL 1000 MG IV SOLR
750.0000 mg | Freq: Two times a day (BID) | INTRAVENOUS | Status: DC
  Administered 2012-08-09 – 2012-08-10 (×3): 750 mg via INTRAVENOUS
  Filled 2012-08-09 (×5): qty 750

## 2012-08-09 MED ORDER — VANCOMYCIN HCL 1000 MG IV SOLR
INTRAVENOUS | Status: AC
  Filled 2012-08-09: qty 1000

## 2012-08-09 MED ORDER — CHLORHEXIDINE GLUCONATE CLOTH 2 % EX PADS
6.0000 | MEDICATED_PAD | Freq: Every day | CUTANEOUS | Status: DC
  Administered 2012-08-09 – 2012-08-11 (×3): 6 via TOPICAL

## 2012-08-09 NOTE — Progress Notes (Signed)
Subjective: This man was admitted yesterday with fevers. Blood cultures are clearly positive for gram positive cocci in clusters. He has been started on vancomycin intravenously. 2-D echocardiogram has been ordered.           Physical Exam: Blood pressure 126/72, pulse 84, temperature 98 F (36.7 C), temperature source Oral, resp. rate 20, height 5\' 5"  (1.651 m), weight 76.3 kg (168 lb 3.4 oz), SpO2 100.00%. Looks systemically well. Still not toxic or septic. Heart sounds are present with a systolic murmur which was heard yesterday. I do not think he clinically has endocarditis, however. Lung fields are clear. He is alert and orientated.   Investigations:  Recent Results (from the past 240 hour(s))  CULTURE, BLOOD (ROUTINE X 2)     Status: None   Collection Time    08/08/12 11:02 AM      Result Value Range Status   Specimen Description BLOOD RIGHT ARM DRAWN BY RN   Final   Special Requests BOTTLES DRAWN AEROBIC ONLY 8CC BOTTLE   Final   Culture     Final   Value: GRAM POSITIVE COCCI IN CLUSTERS     Gram Stain Report Called to,Read Back By and Verified With: THOMAS,C @ 0128 ON 08/09/12 BY WOODIE,J     GS DONE @ [facility]    Report Status PENDING   Incomplete  CULTURE, BLOOD (ROUTINE X 2)     Status: None   Collection Time    08/08/12 11:02 AM      Result Value Range Status   Specimen Description BLOOD LEFT ARM   Final   Special Requests     Final   Value: BOTTLES DRAWN AEROBIC AND ANAEROBIC 8CC EACH BOTTLE   Culture     Final   Value: GRAM POSITIVE COCCI IN CLUSTERS     Gram Stain Report Called to,Read Back By and Verified With: THOMAS,C @ 0128 ON 08/09/12 BY WOODIE,J     GS DONE @ [facility]   Report Status PENDING   Incomplete     Basic Metabolic Panel:  Recent Labs  16/10/96 1101 08/09/12 0542  NA 133* 133*  K 3.9 3.9  CL 96 99  CO2 27 26  GLUCOSE 128* 107*  BUN 14 22  CREATININE 1.08 1.14  CALCIUM 8.8 8.5   Liver Function Tests:  Recent Labs  08/08/12 1101  08/09/12 0542  AST 37 36  ALT 15 16  ALKPHOS 69 69  BILITOT 1.3* 0.9  PROT 7.0 6.8  ALBUMIN 3.4* 3.0*     CBC:  Recent Labs  08/06/12 1624 08/08/12 1101 08/09/12 0542  WBC 13.1* 14.4* 10.8*  NEUTROABS 11.4* 12.4*  --   HGB 13.9 12.4* 12.0*  HCT 41.8 37.4* 36.6*  MCV 91.9 93.3 93.8  PLT 158 131* 126*    Dg Chest Portable 1 View  08/08/2012  *RADIOLOGY REPORT*  Clinical Data: Weakness and pain.  PORTABLE CHEST - 1 VIEW  Comparison: 08/06/2012 and prior chest radiographs  Findings: The cardiomediastinal silhouette is unremarkable. There is no evidence of focal airspace disease, pulmonary edema, suspicious pulmonary nodule/mass, pleural effusion, or pneumothorax. No acute bony abnormalities are identified.  IMPRESSION: No evidence of acute cardiopulmonary disease.   Original Report Authenticated By: Harmon Pier, M.D.       Medications: I have reviewed the patient's current medications.  Impression: 1. Gram-positive cocci bacteremia, presumed staph aureus, possibly MRSA.     Plan: 1. Continue with intravenous vancomycin. Discontinue IV Rocephin. Agree  with 2-D echocardiogram.     LOS: 1 day   Wilson Singer Pager [phone]  08/09/2012, 8:30 AM

## 2012-08-09 NOTE — Progress Notes (Signed)
CRITICAL VALUE ALERT  Critical value received:  Positive blood cultures  Date of notification:  08/08/12  Time of notification:  2200  Critical value read back: yes  Nurse who received alert:  Floreen Comber, RN  MD notified (1st page):  Phillips Odor   Time of first page:  2200  MD notified (2nd page):  Time of second page:  Responding MD:  Phillips Odor  Time MD responded:  2200

## 2012-08-09 NOTE — Progress Notes (Signed)
[facility] Night Coverage Note  Called re: positive blood cultures X2 for GPC in clusters. Patient with Staph aureus bacteremia, fevers. Has mild UTI, hematuria and urinary retention. Patient c/o back pain, hip pain and thigh pain but was also reportedly shoveling snow. He has a history of back surgery laminectomy unclear if with hardware in 2010. No wounds or skin lesions. Would be unusual for urinary tract to be the source unless he had a recent urinary intervention or catheter. No PNA on CXR.   Started Vancomycin presumed MRSA until Cx returns  CT his Lumbar spine for discitis  2D echo TTE, may need TEE  Anderson Malta, DO Internal Medicine

## 2012-08-09 NOTE — Progress Notes (Signed)
UR Chart Review Completed  

## 2012-08-09 NOTE — Progress Notes (Signed)
ANTIBIOTIC CONSULT NOTE - FOLLOW UP  Pharmacy Consult for Vancomycin Indication: staph bacteremia  No Known Allergies  Patient Measurements: Height: 5\' 5"  (165.1 cm) Weight: 168 lb 3.4 oz (76.3 kg) IBW/kg (Calculated) : 61.5  Vital Signs: Temp: 98 F (36.7 C) (02/17 0459) Temp src: Oral (02/17 0459) BP: 126/72 mmHg (02/17 0459) Pulse Rate: 84 (02/17 0459) Intake/Output from previous day: 02/16 0701 - 02/17 0700 In: 2505.4 [P.O.:120; I.V.:2235.4; IV Piggyback:150] Out: 200 [Urine:200] Intake/Output from this shift: Total I/O In: -  Out: 200 [Urine:200]  Labs:  Recent Labs  08/06/12 1624 08/08/12 1101 08/09/12 0542  WBC 13.1* 14.4* 10.8*  HGB 13.9 12.4* 12.0*  PLT 158 131* 126*  CREATININE 0.97 1.08 1.14   Estimated Creatinine Clearance: 46 ml/min (by C-G formula based on Cr of 1.14). No results found for this basename: VANCOTROUGH, Leodis Binet, VANCORANDOM, GENTTROUGH, GENTPEAK, GENTRANDOM, TOBRATROUGH, TOBRAPEAK, TOBRARND, AMIKACINPEAK, AMIKACINTROU, AMIKACIN,  in the last 72 hours   Microbiology: Recent Results (from the past 720 hour(s))  CULTURE, BLOOD (ROUTINE X 2)     Status: None   Collection Time    08/08/12 11:02 AM      Result Value Range Status   Specimen Description BLOOD RIGHT ARM DRAWN BY RN   Final   Special Requests BOTTLES DRAWN AEROBIC ONLY 8CC BOTTLE   Final   Culture     Final   Value: GRAM POSITIVE COCCI IN CLUSTERS     Gram Stain Report Called to,Read Back By and Verified With: THOMAS,C @ 0128 ON 08/09/12 BY WOODIE,J     GS DONE @ [facility]    Report Status PENDING   Incomplete  CULTURE, BLOOD (ROUTINE X 2)     Status: None   Collection Time    08/08/12 11:02 AM      Result Value Range Status   Specimen Description BLOOD LEFT ARM   Final   Special Requests     Final   Value: BOTTLES DRAWN AEROBIC AND ANAEROBIC 8CC EACH BOTTLE   Culture     Final   Value: GRAM POSITIVE COCCI IN CLUSTERS     Gram Stain Report Called to,Read Back By and  Verified With: THOMAS,C @ 0128 ON 08/09/12 BY WOODIE,J     GS DONE @ [facility]   Report Status PENDING   Incomplete    Anti-infectives   Start     Dose/Rate Route Frequency Ordered Stop   08/09/12 1300  cefTRIAXone (ROCEPHIN) 1 g in dextrose 5 % 50 mL IVPB     1 g 100 mL/hr over 30 Minutes Intravenous Every 24 hours 08/08/12 1237     08/09/12 1100  vancomycin (VANCOCIN) 750 mg in sodium chloride 0.9 % 150 mL IVPB     750 mg 150 mL/hr over 60 Minutes Intravenous Every 12 hours 08/09/12 0741     08/09/12 0200  vancomycin (VANCOCIN) 750 mg in sodium chloride 0.9 % 150 mL IVPB     750 mg 150 mL/hr over 60 Minutes Intravenous  Once 08/09/12 0150 08/09/12 0319   08/08/12 1245  cefTRIAXone (ROCEPHIN) 1 g in dextrose 5 % 50 mL IVPB  Status:  Discontinued     1 g 100 mL/hr over 30 Minutes Intravenous Every 24 hours 08/08/12 1236 08/08/12 1237   08/08/12 1200  cefTRIAXone (ROCEPHIN) 1 g in dextrose 5 % 50 mL IVPB     1 g 100 mL/hr over 30 Minutes Intravenous  Once 08/08/12 1147 08/08/12 1223     Assessment: 79yo  male with positive blood cx's x 2 for GPC in clusters.  Pt has staph aureus bacteremia, fevers, UTI, hematuria, and urinary retention per MD notes.  SCr OK.  Estimated Creatinine Clearance: 46 ml/min (by C-G formula based on Cr of 1.14).  Vancomycin 750mg  IV q12hrs  2/17 >> Rocephin 1gm IV q24hrs  2/17 >>  Goal of Therapy:  Vancomycin trough level 15-20 mcg/ml  Plan: Vancomycin 750mg  IV q12hrs Check trough tomorrow am Continue Rocephin Duration of therapy per MD Monitor labs, renal fxn, and cultures per protocol  Valrie Hart A 08/09/2012,7:44 AM

## 2012-08-09 NOTE — Consult Note (Signed)
ANTIBIOTIC CONSULT NOTE-Preliminary  Pharmacy Consult for Vancomycin Indication: Staphylococcal bacteremia   No Known Allergies  Patient Measurements: Height: 5\' 5"  (165.1 cm) Weight: 168 lb 3.4 oz (76.3 kg) IBW/kg (Calculated) : 61.5   Vital Signs: Temp: 102.9 F (39.4 C) (02/16 2153) Temp src: Axillary (02/16 2153) BP: 118/72 mmHg (02/16 2153) Pulse Rate: 114 (02/16 2153)  Labs:  Recent Labs  08/06/12 1624 08/08/12 1101  WBC 13.1* 14.4*  HGB 13.9 12.4*  PLT 158 131*  CREATININE 0.97 1.08    Estimated Creatinine Clearance: 48.5 ml/min (by C-G formula based on Cr of 1.08).  No results found for this basename: VANCOTROUGH, VANCOPEAK, VANCORANDOM, GENTTROUGH, GENTPEAK, GENTRANDOM, TOBRATROUGH, TOBRAPEAK, TOBRARND, AMIKACINPEAK, AMIKACINTROU, AMIKACIN,  in the last 72 hours   Microbiology: Recent Results (from the past 720 hour(s))  CULTURE, BLOOD (ROUTINE X 2)     Status: None   Collection Time    08/08/12 11:02 AM      Result Value Range Status   Specimen Description BLOOD RIGHT ARM DRAWN BY RN   Final   Special Requests BOTTLES DRAWN AEROBIC ONLY 8CC BOTTLE   Final   Culture PENDING   Incomplete   Report Status PENDING   Incomplete  CULTURE, BLOOD (ROUTINE X 2)     Status: None   Collection Time    08/08/12 11:02 AM      Result Value Range Status   Specimen Description BLOOD LEFT ARM   Final   Special Requests     Final   Value: BOTTLES DRAWN AEROBIC AND ANAEROBIC 8CC EACH BOTTLE   Culture PENDING   Incomplete   Report Status PENDING   Incomplete    Medical History: Past Medical History  Diagnosis Date  . Stroke   . Renal disorder   . Arthritis     Medications:  Ceftriaxone 1 Gm IV every 24 hours.    Assessment: Pt is an 79 yo male with 2 day hx of weakness, fever, chills, and urinary incontinence; now with blood cultures positive for GPC in clusters.   Goal of Therapy:  Vancomycin peaks 15-20 mcg/ml   Plan:  Preliminary review of  pertinent patient information completed.  Protocol will be initiated with a one-time dose of 750 mg IV Vancomycin.  Jeani Hawking clinical pharmacist will complete review during morning rounds to assess patient and finalize treatment regimen.  Arelia Sneddon, [facility] 08/09/2012,1:57 AM

## 2012-08-10 DIAGNOSIS — I359 Nonrheumatic aortic valve disorder, unspecified: Secondary | ICD-10-CM

## 2012-08-10 LAB — URINE CULTURE

## 2012-08-10 LAB — BASIC METABOLIC PANEL
BUN: 15 mg/dL (ref 6–23)
Calcium: 8 mg/dL — ABNORMAL LOW (ref 8.4–10.5)
Creatinine, Ser: 0.97 mg/dL (ref 0.50–1.35)
GFR calc Af Amer: 85 mL/min — ABNORMAL LOW (ref >90)
GFR calc non Af Amer: 74 mL/min — ABNORMAL LOW (ref >90)
Potassium: 3.5 mEq/L (ref 3.5–5.1)

## 2012-08-10 LAB — CBC
HCT: 32.3 % — ABNORMAL LOW (ref 39.0–52.0)
MCH: 30.6 pg (ref 26.0–34.0)
MCHC: 32.8 g/dL (ref 30.0–36.0)
RDW: 13.5 % (ref 11.5–15.5)

## 2012-08-10 MED ORDER — VANCOMYCIN HCL IN DEXTROSE 1-5 GM/200ML-% IV SOLN
1000.0000 mg | Freq: Two times a day (BID) | INTRAVENOUS | Status: DC
  Administered 2012-08-10: 1000 mg via INTRAVENOUS
  Filled 2012-08-10 (×2): qty 200

## 2012-08-10 MED ORDER — CEFAZOLIN SODIUM-DEXTROSE 2-3 GM-% IV SOLR
2.0000 g | Freq: Three times a day (TID) | INTRAVENOUS | Status: DC
  Administered 2012-08-10 – 2012-08-11 (×3): 2 g via INTRAVENOUS
  Filled 2012-08-10 (×4): qty 50

## 2012-08-10 NOTE — Care Management Note (Unsigned)
    Page 1 of 1   08/12/2012     3:45:26 PM   CARE MANAGEMENT NOTE 08/12/2012  Patient:  Paul Bradshaw, Paul Bradshaw   Account Number:  [id number]  Date Initiated:  08/10/2012  Documentation initiated by:  Rosemary Holms  Subjective/Objective Assessment:   Pt admitted from home with spouse. Wife at bedside and states she wants him to go home when DC''d. Wife selecdted [facility] if [facility] is needed.     Action/Plan:   Await PT eval when medically able.   Anticipated DC Date:     Anticipated DC Plan:  HOME W HOME HEALTH SERVICES      DC Planning Services  CM consult      [facility] Choice  HOME HEALTH   Choice offered to / List presented to:  C-3 Spouse           HH agency  [facility]   Status of service:  In process, will continue to follow Medicare Important Message given?   (If response is "NO", the following Medicare IM given date fields will be blank) Date Medicare IM given:   Date Additional Medicare IM given:    Discharge Disposition:    Per UR Regulation:    If discussed at Long Length of Stay Meetings, dates discussed:    Comments:  08/12/12 Rosemary Holms RN BSN CM Spoke to wife who is encouraged by pt's progress. Still interested in [facility] assistance. No equipment needs identified  08/10/12 Rosemary Holms RN BSN CM

## 2012-08-10 NOTE — Progress Notes (Signed)
ANTIBIOTIC CONSULT NOTE - FOLLOW UP  Pharmacy Consult for Vancomycin Indication: staph bacteremia  No Known Allergies  Patient Measurements: Height: 5\' 5"  (165.1 cm) Weight: 168 lb 3.4 oz (76.3 kg) IBW/kg (Calculated) : 61.5  Vital Signs: Temp: 99.5 F (37.5 C) (02/18 0537) Temp src: Oral (02/18 0537) BP: 147/69 mmHg (02/18 0537) Pulse Rate: 102 (02/18 0537) Intake/Output from previous day: 02/17 0701 - 02/18 0700 In: 480 [P.O.:480] Out: 900 [Urine:900] Intake/Output from this shift: Total I/O In: 120 [P.O.:120] Out: 200 [Urine:200]  Labs:  Recent Labs  08/08/12 1101 08/09/12 0542 08/10/12 0529  WBC 14.4* 10.8* 8.3  HGB 12.4* 12.0* 10.6*  PLT 131* 126* 131*  CREATININE 1.08 1.14 0.97   Estimated Creatinine Clearance: 54 ml/min (by C-G formula based on Cr of 0.97).  Recent Labs  08/10/12 0926  VANCOTROUGH 6.5*    Microbiology: Recent Results (from the past 720 hour(s))  CULTURE, BLOOD (ROUTINE X 2)     Status: None   Collection Time    08/08/12 11:02 AM      Result Value Range Status   Specimen Description BLOOD RIGHT ARM DRAWN BY RN C. EDWARDS   Final   Special Requests BOTTLES DRAWN AEROBIC ONLY 8CC BOTTLE   Final   Culture  Setup Time 08/09/2012 02:35   Final   Culture     Final   Value: STAPHYLOCOCCUS AUREUS     Note: RIFAMPIN AND GENTAMICIN SHOULD NOT BE USED AS SINGLE DRUGS FOR TREATMENT OF STAPH INFECTIONS.     Note: Gram Stain Report Called to,Read Back By and Verified With: THOMAS C @0128  08/09/12 BY Cristela Blue J Performed at [facility]   Report Status PENDING   Incomplete  CULTURE, BLOOD (ROUTINE X 2)     Status: None   Collection Time    08/08/12 11:02 AM      Result Value Range Status   Specimen Description BLOOD LEFT ARM   Final   Special Requests     Final   Value: BOTTLES DRAWN AEROBIC AND ANAEROBIC 8CC EACH BOTTLE   Culture  Setup Time 08/09/2012 02:10   Final   Culture     Final   Value: STAPHYLOCOCCUS AUREUS     Note: Gram  Stain Report Called to,Read Back By and Verified With: THOMAS C @ 0128 08/09/12 BY Cristela Blue J Performed at [facility]   Report Status PENDING   Incomplete  URINE CULTURE     Status: None   Collection Time    08/08/12 11:04 AM      Result Value Range Status   Specimen Description URINE, CLEAN CATCH   Final   Special Requests NONE   Final   Culture  Setup Time 08/09/2012 02:53   Final   Colony Count 20,OOO COLONIES/ML   Final   Culture     Final   Value: Multiple bacterial morphotypes present, none predominant. Suggest appropriate recollection if clinically indicated.   Report Status 08/10/2012 FINAL   Final  URINE CULTURE     Status: None   Collection Time    08/08/12  9:48 PM      Result Value Range Status   Specimen Description URINE, CLEAN CATCH   Final   Special Requests NONE   Final   Culture  Setup Time 08/09/2012 15:08   Final   Colony Count NO GROWTH   Final   Culture NO GROWTH   Final   Report Status 08/10/2012 FINAL   Final  Anti-infectives   Start     Dose/Rate Route Frequency Ordered Stop   08/09/12 1300  cefTRIAXone (ROCEPHIN) 1 g in dextrose 5 % 50 mL IVPB  Status:  Discontinued     1 g 100 mL/hr over 30 Minutes Intravenous Every 24 hours 08/08/12 1237 08/09/12 0809   08/09/12 1100  vancomycin (VANCOCIN) 750 mg in sodium chloride 0.9 % 150 mL IVPB     750 mg 150 mL/hr over 60 Minutes Intravenous Every 12 hours 08/09/12 0741     08/09/12 0200  vancomycin (VANCOCIN) 750 mg in sodium chloride 0.9 % 150 mL IVPB     750 mg 150 mL/hr over 60 Minutes Intravenous  Once 08/09/12 0150 08/09/12 0319   08/08/12 1245  cefTRIAXone (ROCEPHIN) 1 g in dextrose 5 % 50 mL IVPB  Status:  Discontinued     1 g 100 mL/hr over 30 Minutes Intravenous Every 24 hours 08/08/12 1236 08/08/12 1237   08/08/12 1200  cefTRIAXone (ROCEPHIN) 1 g in dextrose 5 % 50 mL IVPB     1 g 100 mL/hr over 30 Minutes Intravenous  Once 08/08/12 1147 08/08/12 1223     Assessment: 79yo male with  positive blood cx's x 2 for GPC in clusters.  Pt has staph aureus bacteremia, fevers, UTI, hematuria, and urinary retention per MD notes.  SCr OK.  Estimated Creatinine Clearance: 54 ml/min (by C-G formula based on Cr of 0.97).  Trough level is below goal.  Vancomycin  2/17 >>  (dose increased 2/18)  Goal of Therapy:  Vancomycin trough level 15-20 mcg/ml  Plan: Increase Vancomycin to 1000mg  IV q12hrs Check trough at steady state Duration of therapy per MD Monitor labs, renal fxn, and cultures per protocol  Valrie Hart A 08/10/2012,11:23 AM

## 2012-08-10 NOTE — Progress Notes (Signed)
*  PRELIMINARY RESULTS* Echocardiogram 2D Echocardiogram has been performed.  Paul Bradshaw 08/10/2012, 3:29 PM

## 2012-08-10 NOTE — Progress Notes (Signed)
Subjective: This man was admitted with fevers. Blood cultures are clearly positive for gram positive cocci in clusters. He has been started on vancomycin intravenously. 2-D echocardiogram has been ordered. He feels and looks better today.           Physical Exam: Blood pressure 147/69, pulse 102, temperature 99.5 F (37.5 C), temperature source Oral, resp. rate 18, height 5\' 5"  (1.651 m), weight 76.3 kg (168 lb 3.4 oz), SpO2 100.00%. Looks systemically well. Still not toxic or septic. Heart sounds are present with a systolic murmur which was heard yesterday. I do not think he clinically has endocarditis, however. Lung fields are clear. He is alert and orientated.   Investigations:  Recent Results (from the past 240 hour(s))  CULTURE, BLOOD (ROUTINE X 2)     Status: None   Collection Time    08/08/12 11:02 AM      Result Value Range Status   Specimen Description BLOOD RIGHT ARM DRAWN BY RN C. EDWARDS   Final   Special Requests BOTTLES DRAWN AEROBIC ONLY 8CC BOTTLE   Final   Culture  Setup Time 08/09/2012 02:35   Final   Culture     Final   Value: GRAM POSITIVE COCCI IN CLUSTERS     Note: Gram Stain Report Called to,Read Back By and Verified With: THOMAS C @0128  08/09/12 BY Cristela Blue J Performed at [facility]   Report Status PENDING   Incomplete  CULTURE, BLOOD (ROUTINE X 2)     Status: None   Collection Time    08/08/12 11:02 AM      Result Value Range Status   Specimen Description BLOOD LEFT ARM   Final   Special Requests     Final   Value: BOTTLES DRAWN AEROBIC AND ANAEROBIC 8CC EACH BOTTLE   Culture  Setup Time 08/09/2012 02:10   Final   Culture     Final   Value: GRAM POSITIVE COCCI IN CLUSTERS     Note: Gram Stain Report Called to,Read Back By and Verified With: THOMAS C @ 0128 08/09/12 BY Cristela Blue J Performed at [facility]   Report Status PENDING   Incomplete  URINE CULTURE     Status: None   Collection Time    08/08/12 11:04 AM      Result Value  Range Status   Specimen Description URINE, CLEAN CATCH   Final   Special Requests NONE   Final   Culture  Setup Time 08/09/2012 02:53   Final   Colony Count 20,OOO COLONIES/ML   Final   Culture     Final   Value: Multiple bacterial morphotypes present, none predominant. Suggest appropriate recollection if clinically indicated.   Report Status 08/10/2012 FINAL   Final  URINE CULTURE     Status: None   Collection Time    08/08/12  9:48 PM      Result Value Range Status   Specimen Description URINE, CLEAN CATCH   Final   Special Requests NONE   Final   Culture  Setup Time 08/09/2012 15:08   Final   Colony Count NO GROWTH   Final   Culture NO GROWTH   Final   Report Status 08/10/2012 FINAL   Final     Basic Metabolic Panel:  Recent Labs  16/10/96 0542 08/10/12 0529  NA 133* 133*  K 3.9 3.5  CL 99 99  CO2 26 25  GLUCOSE 107* 100*  BUN 22 15  CREATININE 1.14  0.97  CALCIUM 8.5 8.0*   Liver Function Tests:  Recent Labs  08/08/12 1101 08/09/12 0542  AST 37 36  ALT 15 16  ALKPHOS 69 69  BILITOT 1.3* 0.9  PROT 7.0 6.8  ALBUMIN 3.4* 3.0*     CBC:  Recent Labs  08/08/12 1101 08/09/12 0542 08/10/12 0529  WBC 14.4* 10.8* 8.3  NEUTROABS 12.4*  --   --   HGB 12.4* 12.0* 10.6*  HCT 37.4* 36.6* 32.3*  MCV 93.3 93.8 93.4  PLT 131* 126* 131*    Ct Lumbar Spine W Contrast  08/09/2012  *RADIOLOGY REPORT*  Clinical Data: Staph positive blood cultures.  Fever.  Rule out spinal infection.  CT LUMBAR SPINE WITHOUT  CONTRAST  Technique:  Multidetector CT imaging of the lumbar spine was performed without intravenous contrast administration. Multiplanar CT image reconstructions were also generated.  Comparison: Radiographs 04/20/2009, lumbar MRI 03/29/2009  Findings: Negative for fracture or mass lesion.  No evidence of acute bony destruction or spinal infection.  Interval decompressive laminectomy L3-4 and L4-5 since the prior study.  T12-L1:  Disc degeneration and facet  degeneration.  L1-2:  Disc bulging and disc calcification.  Bilateral facet hypertrophy with mild spinal stenosis.  L2-3:  Progressive disc space narrowing with fusion at this level. This is most likely degenerative in nature but has progressed in the interval.  There is spondylosis and facet degeneration with mild spinal stenosis.  L3-4:  Mild posterior slip.  Disc degeneration and spondylosis. Bilateral facet hypertrophy.  Posterior laminectomy.  There is foraminal encroachment on the right with impingement of the right L3 nerve root.  L4-5:  Posterior decompression.  Disc degeneration and spondylosis.  L5-S1:  Disc degeneration and spondylosis.  Bilateral facet hypertrophy with right foraminal narrowing.  IMPRESSION: No evidence of osteomyelitis.  No fracture.  Multilevel degenerative changes in the lumbar spine.  Posterior decompression L3-4 and L4-5.  Progressive disc space narrowing and fusion at L2-3 which is felt to be degenerative in nature.   Original Report Authenticated By: Janeece Riggers, M.D.    Dg Chest Portable 1 View  08/08/2012  *RADIOLOGY REPORT*  Clinical Data: Weakness and pain.  PORTABLE CHEST - 1 VIEW  Comparison: 08/06/2012 and prior chest radiographs  Findings: The cardiomediastinal silhouette is unremarkable. There is no evidence of focal airspace disease, pulmonary edema, suspicious pulmonary nodule/mass, pleural effusion, or pneumothorax. No acute bony abnormalities are identified.  IMPRESSION: No evidence of acute cardiopulmonary disease.   Original Report Authenticated By: Harmon Pier, M.D.       Medications: I have reviewed the patient's current medications.  Impression: 1. Gram-positive cocci bacteremia, presumed staph aureus, possibly MRSA. No evidence of infection in his spine on CT scanning.     Plan: 1. Continue with intravenous vancomycin. 2. Await 2-D echocardiogram. 3. Mobilize.     LOS: 2 days   Wilson Singer Pager [phone]  08/10/2012, 8:25  AM

## 2012-08-10 NOTE — Progress Notes (Signed)
Infectious Diseases- CHAMP(antibiotics stewardship) Recommendations for Staph Aureus Bacteremia:  Hospital course summary: Paul Bradshaw is an 79yo Male p/w weakness/fever/chills. Found to have SIRS on admit with  103F/HR 107, WBC 14.4 Infectious work-up revealed + blood cultures at 36hr with [facility] on 2/17. He was initially empirically started on ceftriaxone and vancomycin. Now on vancomycin alone. He is afebrile, WBC down to 8 on 2/18. He has 2D echo ordered.      [place] Antimicrobial Management Team Staphylococcus aureus bacteremia   Staphylococcus aureus bacteremia (SAB) is associated with a high rate of complications and mortality.  Specific aspects of clinical management are critical to optimizing the outcome of patients with SAB.  Therefore, the [facility] Antimicrobial Management Team [facility]) has initiated an intervention aimed at improving the management of SAB at [facility].  To do so, Infectious Diseases physicians are providing an evidence-based consult for the management of all patients with SAB.     Yes No Comments  Perform follow-up blood cultures (even if the patient is afebrile) to ensure clearance of bacteremia [x]  []  This has been ordered on 2/18  Remove vascular catheter and obtain follow-up blood cultures after the removal of the catheter []  []    Perform echocardiography to evaluate for endocarditis (transthoracic ECHO is 40-50% sensitive, TEE is > 90% sensitive) If 2D echo is inconclusive, would recommend TEE given he has existing murmur [x]  []  Please keep in mind, that neither test can definitively EXCLUDE endocarditis, and that should clinical suspicion remain high for endocarditis the patient should then still be treated with an "endocarditis" duration of therapy = 6 weeks       Ensure source control []  []  Have all abscesses been drained effectively? Have deep seeded infections (septic joints or osteomyelitis) had appropriate surgical debridement?  Investigate for  "metastatic" sites of infection []  []  Does the patient have ANY symptom or physical exam finding that would suggest a deeper infection (back or neck pain that may be suggestive of vertebral osteomyelitis or epidural abscess, muscle pain that could be a symptom of pyomyositis)?  Keep in mind that for deep seeded infections MRI imaging with contrast is preferred rather than other often insensitive tests such as plain x-rays, especially early in a patient's presentation.  Change antibiotic therapy to please add cefazolin 2gm IV Q 8hr to  vancomycin []  []  Beta-lactam antibiotics are preferred for MSSA due to higher cure rates.   If on Vancomycin, goal trough should be 15 - 20 mcg/mL  Estimated duration of IV antibiotic therapy:  Await results to dictate duration of antibiotics []  []  Consult case management for probably prolonged outpatient IV antibiotic therapy

## 2012-08-11 DIAGNOSIS — M549 Dorsalgia, unspecified: Secondary | ICD-10-CM

## 2012-08-11 LAB — CULTURE, BLOOD (ROUTINE X 2)

## 2012-08-11 LAB — CBC
HCT: 29.6 % — ABNORMAL LOW (ref 39.0–52.0)
Hemoglobin: 9.9 g/dL — ABNORMAL LOW (ref 13.0–17.0)
RBC: 3.24 MIL/uL — ABNORMAL LOW (ref 4.22–5.81)
WBC: 8.9 10*3/uL (ref 4.0–10.5)

## 2012-08-11 MED ORDER — CEFAZOLIN SODIUM-DEXTROSE 2-3 GM-% IV SOLR
2.0000 g | Freq: Three times a day (TID) | INTRAVENOUS | Status: DC
  Administered 2012-08-11 – 2012-08-14 (×10): 2 g via INTRAVENOUS
  Filled 2012-08-11 (×16): qty 50

## 2012-08-11 MED ORDER — ACETAMINOPHEN 500 MG PO TABS
ORAL_TABLET | ORAL | Status: AC
  Filled 2012-08-11: qty 2

## 2012-08-11 MED ORDER — LEVOFLOXACIN IN D5W 750 MG/150ML IV SOLN
750.0000 mg | INTRAVENOUS | Status: DC
  Filled 2012-08-11: qty 150

## 2012-08-11 MED ORDER — SENNOSIDES-DOCUSATE SODIUM 8.6-50 MG PO TABS
1.0000 | ORAL_TABLET | Freq: Two times a day (BID) | ORAL | Status: DC
  Administered 2012-08-11 – 2012-08-14 (×6): 1 via ORAL
  Filled 2012-08-11 (×8): qty 1

## 2012-08-11 NOTE — Progress Notes (Signed)
Subjective: This man was admitted with fevers. Blood cultures are clearly positive for  Methicillin sensitive Staphylococcus aureus.. He had an echocardiogram which did not show any major abnormalities, especially no evidence of endocarditis.           Physical Exam: Blood pressure 130/68, pulse 100, temperature 99.6 F (37.6 C), temperature source Oral, resp. rate 17, height 5\' 5"  (1.651 m), weight 168 lb 3.4 oz (76.3 kg), SpO2 94.00%. Looks systemically well. Still not toxic or septic. Heart sounds are present with a systolic murmur which was heard yesterday. I do not think he clinically has endocarditis, however. Lung fields are clear. He is alert and orientated.   Investigations:  Recent Results (from the past 240 hour(s))  CULTURE, BLOOD (ROUTINE X 2)     Status: None   Collection Time    08/08/12 11:02 AM      Result Value Range Status   Specimen Description BLOOD RIGHT ARM DRAWN BY RN C. EDWARDS   Final   Special Requests BOTTLES DRAWN AEROBIC ONLY 8CC BOTTLE   Final   Culture  Setup Time 08/09/2012 02:35   Final   Culture     Final   Value: STAPHYLOCOCCUS AUREUS     Note: RIFAMPIN AND GENTAMICIN SHOULD NOT BE USED AS SINGLE DRUGS FOR TREATMENT OF STAPH INFECTIONS. This organism DOES NOT demonstrate inducible Clindamycin resistance in vitro.     Note: Gram Stain Report Called to,Read Back By and Verified With: THOMAS C @0128  08/09/12 BY Cristela Blue J Performed at [facility]   Report Status 08/11/2012 FINAL   Final   Organism ID, Bacteria STAPHYLOCOCCUS AUREUS   Final  CULTURE, BLOOD (ROUTINE X 2)     Status: None   Collection Time    08/08/12 11:02 AM      Result Value Range Status   Specimen Description BLOOD LEFT ARM   Final   Special Requests     Final   Value: BOTTLES DRAWN AEROBIC AND ANAEROBIC 8CC EACH BOTTLE   Culture  Setup Time 08/09/2012 02:10   Final   Culture     Final   Value: STAPHYLOCOCCUS AUREUS     Note: SUSCEPTIBILITIES PERFORMED ON  PREVIOUS CULTURE WITHIN THE LAST 5 DAYS.     Note: Gram Stain Report Called to,Read Back By and Verified With: THOMAS C @ 0128 08/09/12 BY Cristela Blue J Performed at [facility]   Report Status 08/11/2012 FINAL   Final  URINE CULTURE     Status: None   Collection Time    08/08/12 11:04 AM      Result Value Range Status   Specimen Description URINE, CLEAN CATCH   Final   Special Requests NONE   Final   Culture  Setup Time 08/09/2012 02:53   Final   Colony Count 20,OOO COLONIES/ML   Final   Culture     Final   Value: Multiple bacterial morphotypes present, none predominant. Suggest appropriate recollection if clinically indicated.   Report Status 08/10/2012 FINAL   Final  URINE CULTURE     Status: None   Collection Time    08/08/12  9:48 PM      Result Value Range Status   Specimen Description URINE, CLEAN CATCH   Final   Special Requests NONE   Final   Culture  Setup Time 08/09/2012 15:08   Final   Colony Count NO GROWTH   Final   Culture NO GROWTH   Final  Report Status 08/10/2012 FINAL   Final     Basic Metabolic Panel:  Recent Labs  40/98/11 0542 08/10/12 0529  NA 133* 133*  K 3.9 3.5  CL 99 99  CO2 26 25  GLUCOSE 107* 100*  BUN 22 15  CREATININE 1.14 0.97  CALCIUM 8.5 8.0*   Liver Function Tests:  Recent Labs  08/08/12 1101 08/09/12 0542  AST 37 36  ALT 15 16  ALKPHOS 69 69  BILITOT 1.3* 0.9  PROT 7.0 6.8  ALBUMIN 3.4* 3.0*     CBC:  Recent Labs  08/08/12 1101  08/10/12 0529 08/11/12 0501  WBC 14.4*  < > 8.3 8.9  NEUTROABS 12.4*  --   --   --   HGB 12.4*  < > 10.6* 9.9*  HCT 37.4*  < > 32.3* 29.6*  MCV 93.3  < > 93.4 91.4  PLT 131*  < > 131* 133*  < > = values in this interval not displayed.  Ct Lumbar Spine Wo Contrast  08/09/2012  *RADIOLOGY REPORT*  Clinical Data: Staph positive blood cultures.  Fever.  Rule out spinal infection.  CT LUMBAR SPINE WITHOUT  CONTRAST  Technique:  Multidetector CT imaging of the lumbar spine was performed  without intravenous contrast administration. Multiplanar CT image reconstructions were also generated.  Comparison: Radiographs 04/20/2009, lumbar MRI 03/29/2009  Findings: Negative for fracture or mass lesion.  No evidence of acute bony destruction or spinal infection.  Interval decompressive laminectomy L3-4 and L4-5 since the prior study.  T12-L1:  Disc degeneration and facet degeneration.  L1-2:  Disc bulging and disc calcification.  Bilateral facet hypertrophy with mild spinal stenosis.  L2-3:  Progressive disc space narrowing with fusion at this level. This is most likely degenerative in nature but has progressed in the interval.  There is spondylosis and facet degeneration with mild spinal stenosis.  L3-4:  Mild posterior slip.  Disc degeneration and spondylosis. Bilateral facet hypertrophy.  Posterior laminectomy.  There is foraminal encroachment on the right with impingement of the right L3 nerve root.  L4-5:  Posterior decompression.  Disc degeneration and spondylosis.  L5-S1:  Disc degeneration and spondylosis.  Bilateral facet hypertrophy with right foraminal narrowing.  IMPRESSION: No evidence of osteomyelitis.  No fracture.  Multilevel degenerative changes in the lumbar spine.  Posterior decompression L3-4 and L4-5.  Progressive disc space narrowing and fusion at L2-3 which is felt to be degenerative in nature.   Original Report Authenticated By: Janeece Riggers, M.D.       Medications: I have reviewed the patient's current medications.  Impression: 1. Methicillin sensitive Staphylococcus aureus bacteremia. Unclear etiology. CT scan imaging of the lumbar spine.     Plan: 1. Continue with cefazolin 2 g intravenously every 8 hours. I discussed with infectious disease regarding further investigations. First we will start with transesophageal echocardiogram. I've spoken with Dr. Daleen Squibb, cardiology, who will arrange for TEE tomorrow by Dr. Eden Emms.. I think if the TEE is negative, I would consider  MRI of the lumbar spine to look for an abscess.     LOS: 3 days   Wilson Singer Pager [phone]  08/11/2012, 9:48 AM

## 2012-08-11 NOTE — Progress Notes (Signed)
Agree with Dr. Karilyn Cota plan for management of SAB      [place] Antimicrobial Management Team Staphylococcus aureus bacteremia   Staphylococcus aureus bacteremia (SAB) is associated with a high rate of complications and mortality.  Specific aspects of clinical management are critical to optimizing the outcome of patients with SAB.  Therefore, the [facility] Antimicrobial Management Team [facility]) has initiated an intervention aimed at improving the management of SAB at [facility].  To do so, Infectious Diseases physicians are providing an evidence-based consult for the management of all patients with SAB.     Yes No Comments  Perform follow-up blood cultures (even if the patient is afebrile) to ensure clearance of bacteremia [x]  []  Awaiting finalization of cultures drawn from 2/18 thus far NGTD at 24hr       Perform echocardiography to evaluate for endocarditis (transthoracic ECHO is 40-50% sensitive, TEE is > 90% sensitive) Cardiology evaluating for TEE [x]  []  Please keep in mind, that neither test can definitively EXCLUDE endocarditis, and that should clinical suspicion remain high for endocarditis the patient should then still be treated with an "endocarditis" duration of therapy = 6 weeks       Ensure source control []  []  Have all abscesses been drained effectively? Have deep seeded infections (septic joints or osteomyelitis) had appropriate surgical debridement?  Investigate for "metastatic" sites of infection Lumbar CT showing no diskitis, may need to get back MRI if TEE is negative [x]  []  Does the patient have ANY symptom or physical exam finding that would suggest a deeper infection (back or neck pain that may be suggestive of vertebral osteomyelitis or epidural abscess, muscle pain that could be a symptom of pyomyositis)?  Keep in mind that for deep seeded infections MRI imaging with contrast is preferred rather than other often insensitive tests such as plain x-rays, especially early in  a patient's presentation.  Change antibiotic therapy to ___cefazolin 2gm IV Q 8hr__ [x]  []  Beta-lactam antibiotics are preferred for MSSA due to higher cure rates.   If on Vancomycin, goal trough should be 15 - 20 mcg/mL  Estimated duration of IV antibiotic therapy:   At least 4 wks given current information [x]  []  Consult case management for probably prolonged outpatient IV antibiotic therapy

## 2012-08-11 NOTE — Progress Notes (Signed)
Patient OOB to chair with assist and RW.  Tolerating well.

## 2012-08-12 ENCOUNTER — Other Ambulatory Visit (HOSPITAL_COMMUNITY): Payer: Medicare Other

## 2012-08-12 ENCOUNTER — Encounter (HOSPITAL_COMMUNITY): Payer: Self-pay | Admitting: *Deleted

## 2012-08-12 ENCOUNTER — Encounter (HOSPITAL_COMMUNITY)
Admission: EM | Disposition: A | Discharge status: Skilled Nursing Facility | Payer: Self-pay | Source: Home / Self Care | Attending: Internal Medicine

## 2012-08-12 DIAGNOSIS — I369 Nonrheumatic tricuspid valve disorder, unspecified: Secondary | ICD-10-CM

## 2012-08-12 DIAGNOSIS — M25519 Pain in unspecified shoulder: Secondary | ICD-10-CM

## 2012-08-12 DIAGNOSIS — M25511 Pain in right shoulder: Secondary | ICD-10-CM | POA: Diagnosis present

## 2012-08-12 HISTORY — PX: TEE WITHOUT CARDIOVERSION: SHX5443

## 2012-08-12 LAB — COMPREHENSIVE METABOLIC PANEL
ALT: 8 U/L (ref 0–53)
CO2: 29 mEq/L (ref 19–32)
Calcium: 7.9 mg/dL — ABNORMAL LOW (ref 8.4–10.5)
Creatinine, Ser: 0.89 mg/dL (ref 0.50–1.35)
GFR calc Af Amer: 89 mL/min — ABNORMAL LOW (ref >90)
GFR calc non Af Amer: 76 mL/min — ABNORMAL LOW (ref >90)
Glucose, Bld: 106 mg/dL — ABNORMAL HIGH (ref 70–99)
Total Bilirubin: 0.6 mg/dL (ref 0.3–1.2)

## 2012-08-12 LAB — CBC
Hemoglobin: 10 g/dL — ABNORMAL LOW (ref 13.0–17.0)
MCH: 30.2 pg (ref 26.0–34.0)
MCV: 91.5 fL (ref 78.0–100.0)
RBC: 3.31 MIL/uL — ABNORMAL LOW (ref 4.22–5.81)

## 2012-08-12 SURGERY — ECHOCARDIOGRAM, TRANSESOPHAGEAL
Anesthesia: Moderate Sedation

## 2012-08-12 MED ORDER — SODIUM CHLORIDE 0.9 % IV SOLN: INTRAVENOUS | Status: DC

## 2012-08-12 MED ORDER — FLUMAZENIL 0.5 MG/5ML IV SOLN
INTRAVENOUS | Status: DC | PRN
  Administered 2012-08-12: .5 mg via INTRAVENOUS

## 2012-08-12 MED ORDER — FLUMAZENIL 0.5 MG/5ML IV SOLN
INTRAVENOUS | Status: AC
  Filled 2012-08-12: qty 5

## 2012-08-12 MED ORDER — MIDAZOLAM HCL 5 MG/5ML IJ SOLN
INTRAMUSCULAR | Status: DC | PRN
  Administered 2012-08-12: 1 mg via INTRAVENOUS
  Administered 2012-08-12 (×2): 2 mg via INTRAVENOUS

## 2012-08-12 MED ORDER — FENTANYL CITRATE 0.05 MG/ML IJ SOLN
INTRAMUSCULAR | Status: DC | PRN
  Administered 2012-08-12: 25 ug via INTRAVENOUS

## 2012-08-12 MED ORDER — MIDAZOLAM HCL 5 MG/5ML IJ SOLN
INTRAMUSCULAR | Status: AC
  Filled 2012-08-12: qty 5

## 2012-08-12 MED ORDER — FENTANYL CITRATE 0.05 MG/ML IJ SOLN
INTRAMUSCULAR | Status: AC
  Filled 2012-08-12: qty 2

## 2012-08-12 NOTE — Progress Notes (Signed)
*  PRELIMINARY RESULTS* Echocardiogram Echocardiogram Transesophageal has been performed.  Paul Bradshaw 08/12/2012, 1:39 PM

## 2012-08-12 NOTE — Progress Notes (Signed)
UR Chart Review Completed  

## 2012-08-12 NOTE — Interval H&P Note (Signed)
History and Physical Interval Note:  08/12/2012 12:41 PM  Paul Bradshaw  has presented today for surgery, with the diagnosis of r/o endocarditis  The various methods of treatment have been discussed with the patient and family. After consideration of risks, benefits and other options for treatment, the patient has consented to  Procedure(s): TRANSESOPHAGEAL ECHOCARDIOGRAM (TEE) (N/A) as a surgical intervention .  The patient's history has been reviewed, patient examined, no change in status, stable for surgery.  I have reviewed the patient's chart and labs.  Questions were answered to the patient's satisfaction.     [name]  Asked to do TEE for staph aureus bacteremia  TEE ok

## 2012-08-12 NOTE — Progress Notes (Signed)
  Patient Details  Name: Paul Bradshaw MRN: [id number] Date of Birth: Oct 15, 1925  Today's Date: 08/12/2012 Time:  -    Visit#:  of *0** Re-eval:  I attempted to see pt x2 this AM.  He was having a bath at the 1st attempt, he refused at the 2nd attempt.  Currently, he is having a procedure done.  I will try again tomorrow.   Konrad Penta 08/12/2012, 2:31 PM

## 2012-08-12 NOTE — Progress Notes (Signed)
Chart reviewed.  Subjective: To patient's wife, has not been out of bed for several days. She is concerned about being able to care for him home. She reports that he has had neck pain and shoulder pain after a fall. Chronic low back pain. Chronic right hip pain from arthritis. Patient is currently too sleepy after TEE to give much history.  Objective: Vital signs in last 24 hours: Filed Vitals:   08/12/12 1315 08/12/12 1330 08/12/12 1345 08/12/12 1415  BP: 145/73 143/64 135/66   Pulse: 90 89 88 92  Temp: 98 F (36.7 C)   98.2 F (36.8 C)  TempSrc:      Resp: 24 20 14 16   Height:      Weight:      SpO2: 99% 100% 100% 99%   Weight change:   Intake/Output Summary (Last 24 hours) at 08/12/12 1554 Last data filed at 08/12/12 1419  Gross per 24 hour  Intake    440 ml  Output   2000 ml  Net  -1560 ml   General: Drowsy and confused. Arouses briefly but quickly falls back asleep. Unable to follow commands or give much history Lungs clear to auscultation bilaterally without wheezes rhonchi or rales Cardiovascular regular rate rhythm without murmurs gallops rubs Abdomen soft nontender nondistended Extremities no clubbing cyanosis or edema  Lab Results: Basic Metabolic Panel:  Recent Labs Lab 08/10/12 0529 08/12/12 0533  NA 133* 136  K 3.5 3.0*  CL 99 100  CO2 25 29  GLUCOSE 100* 106*  BUN 15 15  CREATININE 0.97 0.89  CALCIUM 8.0* 7.9*   Liver Function Tests:  Recent Labs Lab 08/09/12 0542 08/12/12 0533  AST 36 29  ALT 16 8  ALKPHOS 69 105  BILITOT 0.9 0.6  PROT 6.8 5.2*  ALBUMIN 3.0* 1.9*   No results found for this basename: LIPASE, AMYLASE,  in the last 168 hours No results found for this basename: AMMONIA,  in the last 168 hours CBC:  Recent Labs Lab 08/06/12 1624 08/08/12 1101  08/11/12 0501 08/12/12 0533  WBC 13.1* 14.4*  < > 8.9 9.0  NEUTROABS 11.4* 12.4*  --   --   --   HGB 13.9 12.4*  < > 9.9* 10.0*  HCT 41.8 37.4*  < > 29.6* 30.3*  MCV 91.9  93.3  < > 91.4 91.5  PLT 158 131*  < > 133* 145*  < > = values in this interval not displayed. Cardiac Enzymes:  Recent Labs Lab 08/06/12 1624 08/08/12 1101  CKTOTAL  --  340*  TROPONINI <0.30  --    BNP: No results found for this basename: PROBNP,  in the last 168 hours D-Dimer: No results found for this basename: DDIMER,  in the last 168 hours CBG:  Recent Labs Lab 08/08/12 1633  GLUCAP 155*   Hemoglobin A1C: No results found for this basename: HGBA1C,  in the last 168 hours Fasting Lipid Panel: No results found for this basename: CHOL, HDL, LDLCALC, TRIG, CHOLHDL, LDLDIRECT,  in the last 168 hours Thyroid Function Tests: No results found for this basename: TSH, T4TOTAL, FREET4, T3FREE, THYROIDAB,  in the last 168 hours Coagulation: No results found for this basename: LABPROT, INR,  in the last 168 hours Anemia Panel: No results found for this basename: VITAMINB12, FOLATE, FERRITIN, TIBC, IRON, RETICCTPCT,  in the last 168 hours Urine Drug Screen: Drugs of Abuse  No results found for this basename: labopia, cocainscrnur, labbenz, amphetmu, thcu, labbarb  Alcohol Level: No results found for this basename: ETH,  in the last 168 hours Urinalysis:  Recent Labs Lab 08/06/12 1835 08/08/12 1104  COLORURINE YELLOW YELLOW  LABSPEC 1.010 1.020  PHURINE 7.0 6.5  GLUCOSEU NEGATIVE NEGATIVE  HGBUR SMALL* LARGE*  BILIRUBINUR NEGATIVE SMALL*  KETONESUR TRACE* 15*  PROTEINUR NEGATIVE 100*  UROBILINOGEN 0.2 2.0*  NITRITE NEGATIVE NEGATIVE  LEUKOCYTESUR NEGATIVE NEGATIVE   Micro Results: Recent Results (from the past 240 hour(s))  CULTURE, BLOOD (ROUTINE X 2)     Status: None   Collection Time    08/08/12 11:02 AM      Result Value Range Status   Specimen Description BLOOD RIGHT ARM DRAWN BY RN C. EDWARDS   Final   Special Requests BOTTLES DRAWN AEROBIC ONLY 8CC BOTTLE   Final   Culture  Setup Time 08/09/2012 02:35   Final   Culture     Final   Value:  STAPHYLOCOCCUS AUREUS     Note: RIFAMPIN AND GENTAMICIN SHOULD NOT BE USED AS SINGLE DRUGS FOR TREATMENT OF STAPH INFECTIONS. This organism DOES NOT demonstrate inducible Clindamycin resistance in vitro.     Note: Gram Stain Report Called to,Read Back By and Verified With: THOMAS C @0128  08/09/12 BY Cristela Blue J Performed at [facility]   Report Status 08/11/2012 FINAL   Final   Organism ID, Bacteria STAPHYLOCOCCUS AUREUS   Final  CULTURE, BLOOD (ROUTINE X 2)     Status: None   Collection Time    08/08/12 11:02 AM      Result Value Range Status   Specimen Description BLOOD LEFT ARM   Final   Special Requests     Final   Value: BOTTLES DRAWN AEROBIC AND ANAEROBIC 8CC EACH BOTTLE   Culture  Setup Time 08/09/2012 02:10   Final   Culture     Final   Value: STAPHYLOCOCCUS AUREUS     Note: SUSCEPTIBILITIES PERFORMED ON PREVIOUS CULTURE WITHIN THE LAST 5 DAYS.     Note: Gram Stain Report Called to,Read Back By and Verified With: THOMAS C @ 0128 08/09/12 BY Cristela Blue J Performed at [facility]   Report Status 08/11/2012 FINAL   Final  URINE CULTURE     Status: None   Collection Time    08/08/12 11:04 AM      Result Value Range Status   Specimen Description URINE, CLEAN CATCH   Final   Special Requests NONE   Final   Culture  Setup Time 08/09/2012 02:53   Final   Colony Count 20,OOO COLONIES/ML   Final   Culture     Final   Value: Multiple bacterial morphotypes present, none predominant. Suggest appropriate recollection if clinically indicated.   Report Status 08/10/2012 FINAL   Final  URINE CULTURE     Status: None   Collection Time    08/08/12  9:48 PM      Result Value Range Status   Specimen Description URINE, CLEAN CATCH   Final   Special Requests NONE   Final   Culture  Setup Time 08/09/2012 15:08   Final   Colony Count NO GROWTH   Final   Culture NO GROWTH   Final   Report Status 08/10/2012 FINAL   Final  CULTURE, BLOOD (ROUTINE X 2)     Status: None   Collection Time     08/10/12  1:27 PM      Result Value Range Status   Specimen Description BLOOD LEFT HAND  Final   Special Requests     Final   Value: BOTTLES DRAWN AEROBIC AND ANAEROBIC AEB=6CC ANA=4CC   Culture NO GROWTH 2 DAYS   Final   Report Status PENDING   Incomplete  CULTURE, BLOOD (ROUTINE X 2)     Status: None   Collection Time    08/10/12  1:51 PM      Result Value Range Status   Specimen Description BLOOD LEFT ARM   Final   Special Requests     Final   Value: BOTTLES DRAWN AEROBIC AND ANAEROBIC AEB=10CC ANA=8CC   Culture NO GROWTH 2 DAYS   Final   Report Status PENDING   Incomplete   Studies/Results: No results found.  Scheduled Meds: . aspirin  325 mg Oral Daily  .  ceFAZolin (ANCEF) IV  2 g Intravenous Q8H  . fentaNYL      . flumazenil      . heparin  5,000 Units Subcutaneous Q8H  . senna-docusate  1 tablet Oral BID  . Tamsulosin HCl  0.4 mg Oral QPM   Continuous Infusions: . sodium chloride 50 mL/hr at 08/10/12 1742   PRN Meds:.acetaminophen, ALPRAZolam, ondansetron (ZOFRAN) IV, ondansetron, oxyCODONE Assessment/Plan: Principal Problem:   Staphylococcus aureus bacteremia Active Problems:   Right shoulder pain   Back pain  The patient will likely need MRI of the lumbar spine, but will get more history and do a more thorough examination to ensure that he does not have an alternate explanation for his bacteremia, i.e. discitis of the C-spine, or shoulder infection. Patient is currently too sedated to thoroughly examine or get much history. We'll at least start off with a plain film of the right shoulder. The patient's wife reports that he fell and experienced pain. Now she is saying he will need placement. Will consult social worker. Needs PICC line.   LOS: 4 days   Melton Walls L 08/12/2012, 3:54 PM

## 2012-08-12 NOTE — Progress Notes (Signed)
Patient still having fevers over the last 48hrs concern for ongoing infection despite targeted antibiotics for MSSA bacteremia     [facility] Management Team Staphylococcus aureus bacteremia   Staphylococcus aureus bacteremia (SAB) is associated with a high rate of complications and mortality.  Specific aspects of clinical management are critical to optimizing the outcome of patients with SAB.  Therefore, the [facility] Antimicrobial Management Team [facility]) has initiated an intervention aimed at improving the management of SAB at [facility].  To do so, Infectious Diseases physicians are providing an evidence-based consult for the management of all patients with SAB.     Yes No Comments  Perform follow-up blood cultures (even if the patient is afebrile) to ensure clearance of bacteremia [x]  []         Perform echocardiography to evaluate for endocarditis (transthoracic ECHO is 40-50% sensitive, TEE is > 90% sensitive) TEE on 2/20 negative for veg [x]  []  Please keep in mind, that neither test can definitively EXCLUDE endocarditis, and that should clinical suspicion remain high for endocarditis the patient should then still be treated with an "endocarditis" duration of therapy = 6 weeks       Ensure source control []  []  Have all abscesses been drained effectively? Have deep seeded infections (septic joints or osteomyelitis) had appropriate surgical debridement?  Investigate for "metastatic" sites of infection  Recommend MRi of C-T-L spine, since patient still having fevers. []  [x]  Does the patient have ANY symptom or physical exam finding that would suggest a deeper infection (back or neck pain that may be suggestive of vertebral osteomyelitis or epidural abscess, muscle pain that could be a symptom of pyomyositis)?  Keep in mind that for deep seeded infections MRI imaging with contrast is preferred rather than other often insensitive tests such as plain x-rays, especially early in a  patient's presentation.  Change antibiotic therapy to__cefazolin 2gm IV q 8hr___ [x]  []  Beta-lactam antibiotics are preferred for MSSA due to higher cure rates.   If on Vancomycin, goal trough should be 15 - 20 mcg/mL  Estimated duration of IV antibiotic therapy:  At least 4 wks [x]  []  Consult case management for probably prolonged outpatient IV antibiotic therapy

## 2012-08-12 NOTE — Progress Notes (Signed)
Patient complains of severe pain in right shoulder and neck. Exhibits limited ROM in right shoulder.

## 2012-08-12 NOTE — CV Procedure (Signed)
   Transesophageal Echocardiogram: Indication:  Bacteremia R/O vegetations Sedation: Versed: 5, Fentanyl: 25, Other: 0 ASA: 3, Airway: 2  Procedure:  The patient was moderately sedated with the above doses of versed and fentanyl.  Using digital technique an omniplane probe was advanced into the distal esophagus without incident. Transgastric imaging revealed normal LV function with no RWMA;s and no mural apical thrombus.  No RWMA;s.  Estimated ejection fraction was 65%.  Right sided cardiac chambers were normal with no evidence of pulmonary hypertension.  The pulmonary and tricuspid valves were structurally normal.  There was mild TR The mitral valve was structurally normal with no mitral regurgitation.    The aortic valve was trileaflet with moderate central AR no abscess or vegetation The aortic root was normal.    Imaging of the septum showed no ASD or VSD Bubble study was negative for shunt 2D and color flow confirmed no PFO  The LAE was well visualized in orthogonal views.  There was no spontaneous contrast and no thrombus.    The descending thoracic aorta had mild mural aortic debris with no evidence of aneurysmal dilation or disection  Impression:  1) No vegetations 2) Moderate AR 3) Normal MV,TV,PV 4) Normal RV/RA 5) No ASD/PFO 6) No LAA thrombus 7) No effusion 8) Mild aortic debris  [name] 08/12/2012 12:43 PM

## 2012-08-13 ENCOUNTER — Inpatient Hospital Stay (HOSPITAL_COMMUNITY): Payer: Medicare Other

## 2012-08-13 ENCOUNTER — Encounter (HOSPITAL_COMMUNITY): Payer: Medicare Other

## 2012-08-13 LAB — BASIC METABOLIC PANEL
BUN: 13 mg/dL (ref 6–23)
Calcium: 8.7 mg/dL (ref 8.4–10.5)
Creatinine, Ser: 0.75 mg/dL (ref 0.50–1.35)
GFR calc non Af Amer: 82 mL/min — ABNORMAL LOW (ref >90)
Glucose, Bld: 116 mg/dL — ABNORMAL HIGH (ref 70–99)

## 2012-08-13 LAB — CBC
HCT: 32.5 % — ABNORMAL LOW (ref 39.0–52.0)
MCV: 90 fL (ref 78.0–100.0)
Platelets: 238 10*3/uL (ref 150–400)
RBC: 3.61 MIL/uL — ABNORMAL LOW (ref 4.22–5.81)
RDW: 13 % (ref 11.5–15.5)
WBC: 11.5 10*3/uL — ABNORMAL HIGH (ref 4.0–10.5)

## 2012-08-13 LAB — DIFFERENTIAL
Basophils Absolute: 0 10*3/uL (ref 0.0–0.1)
Eosinophils Relative: 0 % (ref 0–5)
Lymphocytes Relative: 8 % — ABNORMAL LOW (ref 12–46)
Lymphs Abs: 0.9 10*3/uL (ref 0.7–4.0)
Neutro Abs: 9.6 10*3/uL — ABNORMAL HIGH (ref 1.7–7.7)
Neutrophils Relative %: 83 % — ABNORMAL HIGH (ref 43–77)

## 2012-08-13 MED ORDER — METHOCARBAMOL 500 MG PO TABS
500.0000 mg | ORAL_TABLET | Freq: Three times a day (TID) | ORAL | Status: DC | PRN
  Administered 2012-08-14: 500 mg via ORAL
  Filled 2012-08-13: qty 1

## 2012-08-13 MED ORDER — ACETAMINOPHEN 500 MG PO TABS
1000.0000 mg | ORAL_TABLET | Freq: Three times a day (TID) | ORAL | Status: DC
  Administered 2012-08-13 – 2012-08-14 (×4): 1000 mg via ORAL
  Filled 2012-08-13 (×4): qty 2

## 2012-08-13 MED ORDER — GADOBENATE DIMEGLUMINE 529 MG/ML IV SOLN
18.0000 mL | Freq: Once | INTRAVENOUS | Status: AC | PRN
  Administered 2012-08-13: 18 mL via INTRAVENOUS

## 2012-08-13 MED ORDER — SODIUM CHLORIDE 0.9 % IJ SOLN
10.0000 mL | Freq: Two times a day (BID) | INTRAMUSCULAR | Status: DC
  Administered 2012-08-13: 10 mL

## 2012-08-13 MED ORDER — POTASSIUM CHLORIDE 10 MEQ/100ML IV SOLN
10.0000 meq | INTRAVENOUS | Status: AC
  Administered 2012-08-13 (×2): 10 meq via INTRAVENOUS
  Filled 2012-08-13: qty 200

## 2012-08-13 MED ORDER — POTASSIUM CHLORIDE CRYS ER 20 MEQ PO TBCR
40.0000 meq | EXTENDED_RELEASE_TABLET | Freq: Three times a day (TID) | ORAL | Status: DC
Start: 2012-08-13 — End: 2012-08-14
  Administered 2012-08-13 – 2012-08-14 (×3): 40 meq via ORAL
  Filled 2012-08-13 (×3): qty 2

## 2012-08-13 MED ORDER — SODIUM CHLORIDE 0.9 % IJ SOLN
10.0000 mL | INTRAMUSCULAR | Status: DC | PRN
  Administered 2012-08-13: 10 mL

## 2012-08-13 MED ORDER — ACETAMINOPHEN 325 MG PO TABS
650.0000 mg | ORAL_TABLET | Freq: Four times a day (QID) | ORAL | Status: DC | PRN
  Administered 2012-08-14: 650 mg via ORAL
  Filled 2012-08-13: qty 2

## 2012-08-13 NOTE — Clinical Social Work Note (Signed)
Patient and wife requested bed at [facility], facility made bed offer.  Family informed and asked to call Admissions at [facility].  Facility can take weekend admissions, please call admissions director, Lucius Conn [phone]) when patient is discharge so she can complete insurance authorization for [employer].  FL2 on shadow chart.  Santa Genera, LCSW Clinical Social Worker [phone])

## 2012-08-13 NOTE — Evaluation (Signed)
Physical Therapy Evaluation Patient Details Name: Paul Bradshaw MRN: [id number] DOB: [birth year] Today's Date: 08/13/2012 Time: 1308-6578 PT Time Calculation (min): 34 min  PT Assessment / Plan / Recommendation Clinical Impression  Pt was seen this AM for an eval.  He lives with his wife and had been active and independent prior to this recent illness.  Currently, he has significant limitation of ROM and weakness in the R shoulder due to pain as well as weakness in the RLE due to pain with hip movement.  He needed max assist to transfer to sitting, and standing.  He was unable to achieve full stance to walker, maintaining hip, knee and trunk flexion.  He was unable to take any steps with the walker.  His wife will be unable to care for him at home and in light of his very high functional status PTA I am strongly recommending SNF at d/c.  Just as an addendum, it is interesting to note that PTA the pt used an inversion table daily, assuming a totally inverted piosition and then working aggressively on abdominal strengthening and cervical exercise.             PT Assessment  Patient needs continued PT services    Follow Up Recommendations  SNF    Does the patient have the potential to tolerate intense rehabilitation      Barriers to Discharge Decreased caregiver support wife is elderly with health problems    Equipment Recommendations  Rolling walker with 5" wheels (BSC)    Recommendations for Other Services OT consult   Frequency Min 3X/week    Precautions / Restrictions Precautions Precautions: Fall Restrictions Weight Bearing Restrictions: No   Pertinent Vitals/Pain       Mobility  Bed Mobility Bed Mobility: Supine to Sit;Sit to Supine;Scooting to HOB Supine to Sit: 2: Max assist;HOB elevated Sit to Supine: 2: Max assist;HOB flat Scooting to HOB: 2: Max assist Details for Bed Mobility Assistance: mobility is greatly limited by pain with movement in R shoulder and  hip Transfers Transfers: Sit to Stand;Stand to Sit Sit to Stand: 2: Max assist;With upper extremity assist Stand to Sit: 4: Min assist;With upper extremity assist;To bed Details for Transfer Assistance: pt unable to achieve full stance...keepsw hips and knees flexed with trunk in flexion over walker Ambulation/Gait Ambulation/Gait Assistance: Not tested (comment) (unable to take any steps)    Exercises General Exercises - Upper Extremity Shoulder Flexion: AAROM;Right;10 reps;Supine Shoulder ABduction: AAROM;10 reps;Supine Other Exercises Other Exercises: would like to instruct in pendulum ex to R UE...unable to do so today as pt was needed for a procedure Other Exercises: progress to strengthening ex supine to LEs, expecially to include isometrics   PT Diagnosis: Difficulty walking;Generalized weakness;Acute pain  PT Problem List: Decreased strength;Decreased range of motion;Decreased activity tolerance;Decreased mobility;Decreased knowledge of use of DME;Decreased safety awareness;Pain PT Treatment Interventions: DME instruction;Gait training;Functional mobility training;Therapeutic activities;Therapeutic exercise   PT Goals Acute Rehab PT Goals PT Goal Formulation: With patient Time For Goal Achievement: 08/27/12 Potential to Achieve Goals: Fair Pt will go Supine/Side to Sit: with mod assist PT Goal: Supine/Side to Sit - Progress: Goal set today Pt will go Sit to Supine/Side: with mod assist PT Goal: Sit to Supine/Side - Progress: Goal set today Pt will go Sit to Stand: with mod assist PT Goal: Sit to Stand - Progress: Goal set today Pt will go Stand to Sit: with min assist PT Goal: Stand to Sit - Progress: Goal set today Pt will  Ambulate: 1 - 15 feet;with mod assist;with rolling walker PT Goal: Ambulate - Progress: Goal set today  Visit Information  Last PT Received On: 08/13/12    Subjective Data  Subjective: I was shoveling snow last Thursday! Patient Stated Goal: return  home   Prior Functioning  Home Living Lives With: Spouse Available Help at Discharge: Family;Available 24 hours/day Type of Home: House Home Access: Ramped entrance Home Layout: One level Bathroom Shower/Tub: Engineer, manufacturing systems: Standard Home Adaptive Equipment: None Prior Function Level of Independence: Independent Able to Take Stairs?: Yes Driving: Yes Vocation: Retired Musician: No difficulties    Copywriter, advertising Overall Cognitive Status: Appears within functional limits for tasks assessed/performed Arousal/Alertness: Awake/alert Orientation Level: Appears intact for tasks assessed Behavior During Session: [facility] for tasks performed    Extremity/Trunk Assessment Right Upper Extremity Assessment RUE ROM/Strength/Tone: Deficits;Due to pain RUE ROM/Strength/Tone Deficits: has approximately 45 deg shoulder abduction, 50 deg shoulder flexion AA because of pain on movement...strength and ROM at elbow and hand are WNL Left Upper Extremity Assessment LUE ROM/Strength/Tone: Within functional levels Right Lower Extremity Assessment RLE ROM/Strength/Tone: Deficits;Due to pain RLE ROM/Strength/Tone Deficits: has pain in R hip up into low back with movement of R hip in lfexion and abduction...strength only 2+/5....knee and ankle are WNL RLE Sensation: WFL - Light Touch Left Lower Extremity Assessment LLE ROM/Strength/Tone: WFL for tasks assessed LLE Sensation: WFL - Light Touch LLE Coordination: WFL - gross motor Trunk Assessment Trunk Assessment: Normal   Balance Balance Balance Assessed: Yes Static Sitting Balance Static Sitting - Balance Support: No upper extremity supported;Feet supported Static Sitting - Level of Assistance: 6: Modified independent (Device/Increase time) Dynamic Sitting Balance Dynamic Sitting - Balance Support: No upper extremity supported;Feet supported Dynamic Sitting - Level of Assistance: 5: Stand by assistance  End  of Session PT - End of Session Equipment Utilized During Treatment: Gait belt Activity Tolerance: Patient limited by pain;Patient limited by fatigue Patient left: in bed;with family/visitor present  GP     Konrad Penta 08/13/2012, 9:34 AM

## 2012-08-13 NOTE — Clinical Social Work Psychosocial (Signed)
    Clinical Social Work Department BRIEF PSYCHOSOCIAL ASSESSMENT 08/13/2012  Patient:  Paul Bradshaw, Paul Bradshaw     Account Number:  [id number]     Admit date:  08/08/2012  Clinical Social Worker:  Santa Genera, CLINICAL SOCIAL WORKER  Date/Time:  08/13/2012 02:00 PM  Referred by:  Physician  Date Referred:  08/13/2012 Referred for  SNF Placement   Other Referral:   Interview type:  Patient Other interview type:   Also spoke w wife.    PSYCHOSOCIAL DATA Living Status:  FAMILY Admitted from facility:   Level of care:   Primary support name:  Armandina Stammer Primary support relationship to patient:  SPOUSE Degree of support available:   Significant    CURRENT CONCERNS Current Concerns  Post-Acute Placement   Other Concerns:    SOCIAL WORK ASSESSMENT / PLAN CSW met w patient and wife in room.  Patient alert and oriented, but deferred to wife, saying he could not remember much that was said.  Patient has been living at home w wife, indepedent and active.  Last Friday, he became extremely weak and is now requiring 4 weeks of IV antibiotics at discharge.  Patient and wife agreeable to SNF placement, asked that we specifically look into [facility] as daughter used to work there.  CSW explained placement process, mentioned that copays will be required and that facility will advise on this.    CSW called [facility] and asked that they review documents, facliity states they will work w Susa Simmonds to get authorization but may not be able to do so over the weekend.   Assessment/plan status:  Psychosocial Support/Ongoing Assessment of Needs Other assessment/ plan:   Information/referral to community resources:   SNF list    PATIENT'S/FAMILY'S RESPONSE TO PLAN OF CARE: Patient and wife willing to work w CSW on placement, appreciative.   Santa Genera, LCSW Clinical Social Worker [phone])

## 2012-08-13 NOTE — Progress Notes (Addendum)
Subjective: Patient reports shoulder pain. And muscle spasm in the neck. Chronic low back pain with "sciatica" Possibly slightly worse than usual. Has had previous laminectomy in 2010.  Objective: Vital signs in last 24 hours: Filed Vitals:   08/12/12 1345 08/12/12 1415 08/12/12 2145 08/13/12 0616  BP: 135/66  137/74 178/76  Pulse: 88 92 105 84  Temp:  98.2 F (36.8 C) 102.5 F (39.2 C) 98.6 F (37 C)  TempSrc:   Oral Oral  Resp: 14 16 20 16   Height:      Weight:      SpO2: 100% 99% 97% 96%   Weight change:   Intake/Output Summary (Last 24 hours) at 08/13/12 0824 Last data filed at 08/12/12 2259  Gross per 24 hour  Intake 565.67 ml  Output    875 ml  Net -309.33 ml   General: Alert. Eating breakfast with the assistance of his wife. Able to give more history, but remains rather vague. Lungs clear to auscultation bilaterally without wheezes rhonchi or rales Cardiovascular regular rate rhythm without murmurs gallops rubs Abdomen soft nontender nondistended Extremities no clubbing cyanosis or edema Musculoskeletal: Decreased range of motion of the right shoulder due to pain. Back: No point tenderness along the C-spine. Patient reports that palpation of his lumbar area results in worsening pain down his right hip. No definite point tenderness that I can elicit.  Lab Results: Basic Metabolic Panel:  Recent Labs Lab 08/10/12 0529 08/12/12 0533  NA 133* 136  K 3.5 3.0*  CL 99 100  CO2 25 29  GLUCOSE 100* 106*  BUN 15 15  CREATININE 0.97 0.89  CALCIUM 8.0* 7.9*   Liver Function Tests:  Recent Labs Lab 08/09/12 0542 08/12/12 0533  AST 36 29  ALT 16 8  ALKPHOS 69 105  BILITOT 0.9 0.6  PROT 6.8 5.2*  ALBUMIN 3.0* 1.9*   No results found for this basename: LIPASE, AMYLASE,  in the last 168 hours No results found for this basename: AMMONIA,  in the last 168 hours CBC:  Recent Labs Lab 08/06/12 1624 08/08/12 1101  08/11/12 0501 08/12/12 0533  WBC 13.1*  14.4*  < > 8.9 9.0  NEUTROABS 11.4* 12.4*  --   --   --   HGB 13.9 12.4*  < > 9.9* 10.0*  HCT 41.8 37.4*  < > 29.6* 30.3*  MCV 91.9 93.3  < > 91.4 91.5  PLT 158 131*  < > 133* 145*  < > = values in this interval not displayed. Cardiac Enzymes:  Recent Labs Lab 08/06/12 1624 08/08/12 1101  CKTOTAL  --  340*  TROPONINI <0.30  --    BNP: No results found for this basename: PROBNP,  in the last 168 hours D-Dimer: No results found for this basename: DDIMER,  in the last 168 hours CBG:  Recent Labs Lab 08/08/12 1633  GLUCAP 155*   Hemoglobin A1C: No results found for this basename: HGBA1C,  in the last 168 hours Fasting Lipid Panel: No results found for this basename: CHOL, HDL, LDLCALC, TRIG, CHOLHDL, LDLDIRECT,  in the last 168 hours Thyroid Function Tests: No results found for this basename: TSH, T4TOTAL, FREET4, T3FREE, THYROIDAB,  in the last 168 hours Coagulation: No results found for this basename: LABPROT, INR,  in the last 168 hours Anemia Panel: No results found for this basename: VITAMINB12, FOLATE, FERRITIN, TIBC, IRON, RETICCTPCT,  in the last 168 hours Urine Drug Screen: Drugs of Abuse  No results found for this  basename: labopia,  cocainscrnur,  labbenz,  amphetmu,  thcu,  labbarb    Alcohol Level: No results found for this basename: ETH,  in the last 168 hours Urinalysis:  Recent Labs Lab 08/06/12 1835 08/08/12 1104  COLORURINE YELLOW YELLOW  LABSPEC 1.010 1.020  PHURINE 7.0 6.5  GLUCOSEU NEGATIVE NEGATIVE  HGBUR SMALL* LARGE*  BILIRUBINUR NEGATIVE SMALL*  KETONESUR TRACE* 15*  PROTEINUR NEGATIVE 100*  UROBILINOGEN 0.2 2.0*  NITRITE NEGATIVE NEGATIVE  LEUKOCYTESUR NEGATIVE NEGATIVE   Micro Results: Recent Results (from the past 240 hour(s))  CULTURE, BLOOD (ROUTINE X 2)     Status: None   Collection Time    08/08/12 11:02 AM      Result Value Range Status   Specimen Description BLOOD RIGHT ARM DRAWN BY RN C. EDWARDS   Final   Special  Requests BOTTLES DRAWN AEROBIC ONLY 8CC BOTTLE   Final   Culture  Setup Time 08/09/2012 02:35   Final   Culture     Final   Value: STAPHYLOCOCCUS AUREUS     Note: RIFAMPIN AND GENTAMICIN SHOULD NOT BE USED AS SINGLE DRUGS FOR TREATMENT OF STAPH INFECTIONS. This organism DOES NOT demonstrate inducible Clindamycin resistance in vitro.     Note: Gram Stain Report Called to,Read Back By and Verified With: THOMAS C @0128  08/09/12 BY Cristela Blue J Performed at [facility]   Report Status 08/11/2012 FINAL   Final   Organism ID, Bacteria STAPHYLOCOCCUS AUREUS   Final  CULTURE, BLOOD (ROUTINE X 2)     Status: None   Collection Time    08/08/12 11:02 AM      Result Value Range Status   Specimen Description BLOOD LEFT ARM   Final   Special Requests     Final   Value: BOTTLES DRAWN AEROBIC AND ANAEROBIC 8CC EACH BOTTLE   Culture  Setup Time 08/09/2012 02:10   Final   Culture     Final   Value: STAPHYLOCOCCUS AUREUS     Note: SUSCEPTIBILITIES PERFORMED ON PREVIOUS CULTURE WITHIN THE LAST 5 DAYS.     Note: Gram Stain Report Called to,Read Back By and Verified With: THOMAS C @ 0128 08/09/12 BY Cristela Blue J Performed at [facility]   Report Status 08/11/2012 FINAL   Final  URINE CULTURE     Status: None   Collection Time    08/08/12 11:04 AM      Result Value Range Status   Specimen Description URINE, CLEAN CATCH   Final   Special Requests NONE   Final   Culture  Setup Time 08/09/2012 02:53   Final   Colony Count 20,OOO COLONIES/ML   Final   Culture     Final   Value: Multiple bacterial morphotypes present, none predominant. Suggest appropriate recollection if clinically indicated.   Report Status 08/10/2012 FINAL   Final  URINE CULTURE     Status: None   Collection Time    08/08/12  9:48 PM      Result Value Range Status   Specimen Description URINE, CLEAN CATCH   Final   Special Requests NONE   Final   Culture  Setup Time 08/09/2012 15:08   Final   Colony Count NO GROWTH   Final    Culture NO GROWTH   Final   Report Status 08/10/2012 FINAL   Final  CULTURE, BLOOD (ROUTINE X 2)     Status: None   Collection Time    08/10/12  1:27 PM  Result Value Range Status   Specimen Description BLOOD LEFT HAND   Final   Special Requests     Final   Value: BOTTLES DRAWN AEROBIC AND ANAEROBIC AEB=6CC ANA=4CC   Culture NO GROWTH 2 DAYS   Final   Report Status PENDING   Incomplete  CULTURE, BLOOD (ROUTINE X 2)     Status: None   Collection Time    08/10/12  1:51 PM      Result Value Range Status   Specimen Description BLOOD LEFT ARM   Final   Special Requests     Final   Value: BOTTLES DRAWN AEROBIC AND ANAEROBIC AEB=10CC ANA=8CC   Culture NO GROWTH 2 DAYS   Final   Report Status PENDING   Incomplete   Studies/Results: No results found.  Scheduled Meds: . aspirin  325 mg Oral Daily  .  ceFAZolin (ANCEF) IV  2 g Intravenous Q8H  . heparin  5,000 Units Subcutaneous Q8H  . potassium chloride  10 mEq Intravenous Q1 Hr x 2  . senna-docusate  1 tablet Oral BID  . Tamsulosin HCl  0.4 mg Oral QPM   Continuous Infusions: . sodium chloride 20 mL/hr at 08/12/12 1603   PRN Meds:.acetaminophen, ALPRAZolam, ondansetron (ZOFRAN) IV, ondansetron Assessment/Plan: Principal Problem:   Staphylococcus aureus bacteremia Active Problems:   Right shoulder pain   Back pain  Will get MRI of lumbar spine. Right shoulder x-ray pending. PICC line to be placed this morning. Will repeat labs after PICC line. Repeat blood cultures are negative to date. Will need skilled nursing facility. Will schedule Tylenol and add Robaxin as needed for muscle spasm   LOS: 5 days   Paul Bradshaw 08/13/2012, 8:24 AM

## 2012-08-13 NOTE — Clinical Social Work Placement (Addendum)
     Clinical Social Work Department CLINICAL SOCIAL WORK PLACEMENT NOTE 08/16/2012  Patient:  Paul Bradshaw, Paul Bradshaw  Account Number:  [id number] Admit date:  08/08/2012  Clinical Social Worker:  Santa Genera, CLINICAL SOCIAL WORKER  Date/time:  08/13/2012 12:00 N  Clinical Social Work is seeking post-discharge placement for this patient at the following level of care:   SKILLED NURSING   (*CSW will update this form in Epic as items are completed)   08/13/2012  Patient/family provided with [facility] Department of Clinical Social Works list of facilities offering this level of care within the geographic area requested by the patient (or if unable, by the patients family).  08/13/2012  Patient/family informed of their freedom to choose among providers that offer the needed level of care, that participate in Medicare, Medicaid or managed care program needed by the patient, have an available bed and are willing to accept the patient.  08/13/2012  Patient/family informed of [facility] ownership interest in [facility] as well as of the fact that they are under no obligation to receive care at this facility.  PASARR submitted to [employer] on 08/13/2012 PASARR number received from [employer] on 08/13/2012  FL2 transmitted to all facilities in geographic area requested by pt/family on  08/13/2012 FL2 transmitted to all facilities within larger geographic area on   Patient informed that his/her managed care company has contracts with or will negotiate with  certain facilities, including the following:     Patient/family informed of bed offers received:  08/13/2012 Patient chooses bed at [facility] Physician recommends and patient chooses bed at    Patient to be transferred to [facility] on  08/14/2012 Patient to be transferred to facility by [facility]  The following physician request were entered in Epic:   Additional Comments:

## 2012-08-14 MED ORDER — APAP 325 MG PO TABS: 650.0000 mg | ORAL_TABLET | Freq: Three times a day (TID) | ORAL | Status: DC

## 2012-08-14 MED ORDER — CEFAZOLIN SODIUM-DEXTROSE 2-3 GM-% IV SOLR: 2.0000 g | Freq: Three times a day (TID) | INTRAVENOUS | Status: DC

## 2012-08-14 MED ORDER — SENNOSIDES-DOCUSATE SODIUM 8.6-50 MG PO TABS: 1.0000 | ORAL_TABLET | Freq: Two times a day (BID) | ORAL | Status: DC

## 2012-08-14 NOTE — Progress Notes (Signed)
Report called to Nurse Angelica Chessman at [facility] and rehab. Caregiver verbalized understanding of report. Pt to be transferred to facility with PICC line via EMS.

## 2012-08-14 NOTE — Discharge Summary (Signed)
Physician Discharge Summary  Patient ID: Paul Bradshaw MRN: [id number] DOB/AGE: 29-Jul-1925 79 y.o.  Admit date: 08/08/2012 Discharge date: 08/14/2012  Discharge Diagnoses:  Principal Problem:   Staphylococcus aureus bacteremia, Methicillin sensitive Active Problems:   Right shoulder pain/OA   Back pain, DDD Chronic constipation BPH     Medication List    TAKE these medications       APAP 325 MG tablet  Take 2 tablets (650 mg total) by mouth 3 (three) times daily.     aspirin 325 MG tablet  Take 325 mg by mouth daily.     ceFAZolin 2-3 GM-% Solr  Commonly known as:  ANCEF  Inject 50 mLs (2 g total) into the vein every 8 (eight) hours. For 3 more weeks     senna-docusate 8.6-50 MG per tablet  Commonly known as:  Senokot-S  Take 1 tablet by mouth 2 (two) times daily.     Tamsulosin HCl 0.4 MG Caps  Commonly known as:  FLOMAX  Take 0.4 mg by mouth every evening.            Follow-up Information   Follow up with SNF provider In 1 week.      Disposition: SNF  Discharged Condition: stable  Consults: cardiology for TEE  Labs:    Sodium    136  133 133  136  134    Sodium   Potassium    4.1  3.9 3.5  3.0  2.9    Potassium   Chloride    97  99 99  100  95    Chloride   CO2    27  26 25  29  28     CO2   BUN    15  22 15  15  13     BUN   Creatinine, Ser    0.97  1.14 0.97  0.89  0.75    Creatinine, Ser   Calcium    9.2  8.5 8.0  7.9  8.7    Calcium   GFR calc non Af Amer    74  57 74  76  82    GFR calc non Af Amer   GFR calc Af Amer    85  66 85  89  >90    GFR calc Af Amer   Glucose, Bld    128  107 100  106  116    Glucose, Bld   Magnesium           1.8    Magnesium   Alkaline Phosphatase    66  69   105      Alkaline Phosphatase   Albumin    4.0  3.0   1.9      Albumin   AST    17  36   29      AST   ALT    9  16   8       ALT   Total Protein    7.2  6.8   5.2      Total Protein   Total Bilirubin    1.0  0.9   0.6      Total Bilirubin   CARDIAC PROFILE     Total CK      340         Total CK   Troponin I    <0.30           Troponin I  CBC    WBC    13.1  10.8 8.3 8.9 9.0  11.5    WBC   RBC    4.55  3.90 3.46 3.24 3.31  3.61    RBC   Hemoglobin    13.9  12.0 10.6 9.9 10.0  11.1    Hemoglobin   HCT    41.8  36.6 32.3 29.6 30.3  32.5    HCT   MCV    91.9  93.8 93.4 91.4 91.5  90.0    MCV   MCH    30.5  30.8 30.6 30.6 30.2  30.7    MCH   MCHC    33.3  32.8 32.8 33.4 33.0  34.2    MCHC   RDW    13.0  13.4 13.5 13.3 13.2  13.0    RDW   Platelets    158  126 131 133 145  238    Platelets   DIFFERENTIAL    Neutrophils Relative    87  86     83    Neutrophils Relative   Lymphocytes Relative    3  6     8     Lymphocytes Relative   Monocytes Relative    9  8     8     Monocytes Relative   Eosinophils Relative    0  0     0    Eosinophils Relative   Basophils Relative    0  0     0    Basophils Relative   Neutro Abs    11.4  12.4     9.6    Neutro Abs   Lymphs Abs    0.4  0.9     0.9    Lymphs Abs   Monocytes Absolute    1.2  1.1     1.0    Monocytes Absolute   Eosinophils Absolute    0.0  0.0     0.0    Eosinophils Absolute   Basophils Absolute    0.0  0.0     0.0    Basophils Absolute   ANTIBIOTICS    Vancomycin Tr        6.5       Vancomycin Tr   DIABETES    Glucose, Bld    128  107 100  106  116    Glucose, Bld   VIRAL TESTS    Influenza A By PCR      NEGATIVE         Influenza A By PCR   Influenza B By PCR      NEGATIVE         Influenza B By PCR   H1N1 flu by pcr      NOT DETECTED         H1N1 flu by pcr   URINALYSIS    Color, Urine    YELLOW  YELLOW         Color, Urine   APPearance    CLEAR  CLEAR         APPearance   Specific Gravity, Urine    1.010  1.020         Specific Gravity, Urine   pH    7.0  6.5         pH   Glucose, UA    NEGATIVE  NEGATIVE         Glucose, UA   Bilirubin Urine  NEGATIVE  SMALL         Bilirubin Urine   Ketones, ur    TRACE  15         Ketones, ur   Protein, ur    NEGATIVE  100         Protein, ur    Urobilinogen, UA    0.2  2.0         Urobilinogen, UA   Nitrite    NEGATIVE  NEGATIVE         Nitrite   Leukocytes, UA    NEGATIVE  NEGATIVE         Leukocytes, UA   Hgb urine dipstick    SMALL  LARGE         Hgb urine dipstick   WBC, UA    0-2  7-10         WBC, UA   RBC / HPF    0-2  11-20         RBC / HPF   Squamous Epithelial / LPF    RARE           Squamous Epithelial / LPF   Bacteria, UA    RARE  RARE         Bacteria, UA   Casts      GRANULAR CAST         Casts   MICROBIOLOGY    Organism ID, Bacteria      STAPHYLOCOCCUS   Recent Results (from the past 240 hour(s))  CULTURE, BLOOD (ROUTINE X 2)     Status: None   Collection Time    08/08/12 11:02 AM      Result Value Range Status   Specimen Description BLOOD RIGHT ARM DRAWN BY RN C. EDWARDS   Final   Special Requests BOTTLES DRAWN AEROBIC ONLY 8CC BOTTLE   Final   Culture  Setup Time 08/09/2012 02:35   Final   Culture     Final   Value: STAPHYLOCOCCUS AUREUS     Note: RIFAMPIN AND GENTAMICIN SHOULD NOT BE USED AS SINGLE DRUGS FOR TREATMENT OF STAPH INFECTIONS. This organism DOES NOT demonstrate inducible Clindamycin resistance in vitro.     Note: Gram Stain Report Called to,Read Back By and Verified With: THOMAS C @0128  08/09/12 BY Cristela Blue J Performed at [facility]   Report Status 08/11/2012 FINAL   Final   Organism ID, Bacteria STAPHYLOCOCCUS AUREUS   Final  CULTURE, BLOOD (ROUTINE X 2)     Status: None   Collection Time    08/08/12 11:02 AM      Result Value Range Status   Specimen Description BLOOD LEFT ARM   Final   Special Requests     Final   Value: BOTTLES DRAWN AEROBIC AND ANAEROBIC 8CC EACH BOTTLE   Culture  Setup Time 08/09/2012 02:10   Final   Culture     Final   Value: STAPHYLOCOCCUS AUREUS     Note: SUSCEPTIBILITIES PERFORMED ON PREVIOUS CULTURE WITHIN THE LAST 5 DAYS.     Note: Gram Stain Report Called to,Read Back By and Verified With: THOMAS C @ 0128 08/09/12 BY Cristela Blue J Performed at [facility]   Report Status 08/11/2012 FINAL   Final  URINE CULTURE     Status: None   Collection Time    08/08/12 11:04 AM      Result Value Range Status   Specimen Description URINE, CLEAN CATCH   Final  Special Requests NONE   Final   Culture  Setup Time 08/09/2012 02:53   Final   Colony Count 20,OOO COLONIES/ML   Final   Culture     Final   Value: Multiple bacterial morphotypes present, none predominant. Suggest appropriate recollection if clinically indicated.   Report Status 08/10/2012 FINAL   Final  URINE CULTURE     Status: None   Collection Time    08/08/12  9:48 PM      Result Value Range Status   Specimen Description URINE, CLEAN CATCH   Final   Special Requests NONE   Final   Culture  Setup Time 08/09/2012 15:08   Final   Colony Count NO GROWTH   Final   Culture NO GROWTH   Final   Report Status 08/10/2012 FINAL   Final  CULTURE, BLOOD (ROUTINE X 2)     Status: None   Collection Time    08/10/12  1:27 PM      Result Value Range Status   Specimen Description BLOOD LEFT HAND   Final   Special Requests     Final   Value: BOTTLES DRAWN AEROBIC AND ANAEROBIC AEB=6CC ANA=4CC   Culture NO GROWTH 3 DAYS   Final   Report Status PENDING   Incomplete  CULTURE, BLOOD (ROUTINE X 2)     Status: None   Collection Time    08/10/12  1:51 PM      Result Value Range Status   Specimen Description BLOOD LEFT ARM   Final   Special Requests     Final   Value: BOTTLES DRAWN AEROBIC AND ANAEROBIC AEB=10CC ANA=8CC   Culture NO GROWTH 3 DAYS   Final   Report Status PENDING   Incomplete    Diagnostics:  Dg Cervical Spine Complete  08/13/2012  *RADIOLOGY REPORT*  Clinical Data: Pain  CERVICAL SPINE - COMPLETE 4+ VIEW  Comparison: CT 01/15/2010  Findings: The cervicothoracic junction is not well visualized, and no swimmer's view was submitted.  There is narrowing of C3-4 and C5- 6 interspaces.  Anterior endplate spurring Z6-X0.  No prevertebral soft tissue swelling.  Facets seated.  Small  uncovertebral spurs bilaterally C3 to C6.  Left arm PICC line partially seen. Atheromatous aorta.  Multiple missing teeth and restorations.  No fracture evident.  IMPRESSION:  1.  Negative for fracture or other acute abnormality, although the cervicothoracic junction is not included. 2.  Degenerative changes as above.   Original Report Authenticated By: D. Andria Rhein, MD    Dg Shoulder Right  08/13/2012  *RADIOLOGY REPORT*  Clinical Data: Shoulder pain.  RIGHT SHOULDER - 2+ VIEW  Comparison: None.  Findings: There is no fracture or dislocation.  Glenohumeral and acromioclavicular degenerative change is noted.  Imaged right lung and ribs are unremarkable  IMPRESSION: No acute finding.  Glenohumeral and acromioclavicular degenerative disease.   Original Report Authenticated By: Holley Dexter, M.D.    Ct Lumbar Spine Wo Contrast  08/09/2012  *RADIOLOGY REPORT*  Clinical Data: Staph positive blood cultures.  Fever.  Rule out spinal infection.  CT LUMBAR SPINE WITHOUT  CONTRAST  Technique:  Multidetector CT imaging of the lumbar spine was performed without intravenous contrast administration. Multiplanar CT image reconstructions were also generated.  Comparison: Radiographs 04/20/2009, lumbar MRI 03/29/2009  Findings: Negative for fracture or mass lesion.  No evidence of acute bony destruction or spinal infection.  Interval decompressive laminectomy L3-4 and L4-5 since the prior study.  T12-L1:  Disc degeneration and facet  degeneration.  L1-2:  Disc bulging and disc calcification.  Bilateral facet hypertrophy with mild spinal stenosis.  L2-3:  Progressive disc space narrowing with fusion at this level. This is most likely degenerative in nature but has progressed in the interval.  There is spondylosis and facet degeneration with mild spinal stenosis.  L3-4:  Mild posterior slip.  Disc degeneration and spondylosis. Bilateral facet hypertrophy.  Posterior laminectomy.  There is foraminal encroachment on the right  with impingement of the right L3 nerve root.  L4-5:  Posterior decompression.  Disc degeneration and spondylosis.  L5-S1:  Disc degeneration and spondylosis.  Bilateral facet hypertrophy with right foraminal narrowing.  IMPRESSION: No evidence of osteomyelitis.  No fracture.  Multilevel degenerative changes in the lumbar spine.  Posterior decompression L3-4 and L4-5.  Progressive disc space narrowing and fusion at L2-3 which is felt to be degenerative in nature.   Original Report Authenticated By: Janeece Riggers, M.D.    Mr Brain Wo Contrast  08/06/2012  *RADIOLOGY REPORT*  Clinical Data: Generalized weakness, worse on the left.  Confusion.  MRI HEAD WITHOUT CONTRAST  Technique:  Multiplanar, multiecho pulse sequences of the brain and surrounding structures were obtained according to standard protocol without intravenous contrast.  Comparison: CT head 01/15/2010  Findings: The patient had difficulty remaining motionless for the study.  Images are suboptimal.  Small or subtle lesions could be overlooked.  There is no evidence for acute infarction, intracranial hemorrhage, mass lesion, hydrocephalus, or extra-axial fluid.  Moderate atrophy is present.  Chronic microvascular ischemic changes are noted in the periventricular and subcortical white matter.  Flow voids are maintained in the major intracranial vessels.  There are no foci of chronic hemorrhage.  There is no acute osseous findings.    There is moderate fluid accumulation in the left frontal sinus of indeterminate age.  Upper cervical region unremarkable except for mild pannus.  IMPRESSION: Motion degraded exam.  Moderate atrophy with chronic microvascular ischemic change.  No visible acute stroke or intracranial hemorrhage.   Original Report Authenticated By: Davonna Belling, M.D.    Mr Lumbar Spine W Wo Contrast  08/13/2012  *RADIOLOGY REPORT*  Clinical Data: Low back pain and right hip pain.  Bacteremia.  MRI LUMBAR SPINE WITHOUT AND WITH CONTRAST   Technique:  Multiplanar and multiecho pulse sequences of the lumbar spine were obtained without and with intravenous contrast.  Contrast: 18mL MULTIHANCE GADOBENATE DIMEGLUMINE 529 MG/ML IV SOLN  Comparison: CT scan dated 08/09/2012 and lumbar MRI dated 03/29/2009  Findings: Normal conus tip at L2.  T12-L1 and L1-2:  No significant abnormalities.  L2-3:  Solid interbody fusion.  No residual neural impingement.  No spinal stenosis.  L3-4:  6 mm retrolisthesis.  Soft disc protrusion into the right neural foramen and right lateral recess which should affect the left L3 nerve.  There is some enhancement in that area but there is nonenhancing soft disc material in and lateral to the neural foramen compressing the left L3 nerve.  The patient has undergone posterior decompression.  L4-5:  Interval posterior decompression.  New large synovial cyst arising from the left facet joint measuring 10 x 8 x 6 mm compressing the left side of the thecal sac extending into the left lateral recess.  This should affect the left L4 and L5 nerves. There is also a soft disc protrusion into the right neural foramen and lateral to it which could affect the right L4 nerve. There is marked progression of bilateral severe facet arthritis at  L4-5 with bilateral joint effusions, more prominent on the left than the right.  L5-S1: Small central disc protrusion and annular tear, slightly more prominent than on the prior study without neural impingement. Moderate bilateral foraminal stenosis.  IMPRESSION: 1.  New synovial cyst arising from the left facet joint at L4-5 compressing the thecal sac and the left lateral recess.  This could affect the left L4 and L5 nerves. 2.  Disc protrusion into the right neural foramen at L4-5 with severe right foraminal stenosis. 3.  Disc protrusion into the left neural foramen at L3-4 which could affect the left L3 nerve.   Original Report Authenticated By: Francene Boyers, M.D.    Dg Chest Port 1 View  08/13/2012   *RADIOLOGY REPORT*  Clinical Data: PICC placement.  PORTABLE CHEST - 1 VIEW  Comparison: 08/08/2012.  Findings: There is a new left upper extremity PICC.  The tip is in the upper SVC, superior to the level of the carina.  Borderline heart size and aortic atherosclerosis again noted.  The right basilar atelectasis.  IMPRESSION: New left upper extremity PICC with the tip in the upper to mid SVC.   Original Report Authenticated By: Andreas Newport, M.D.    Dg Chest Portable 1 View  08/08/2012  *RADIOLOGY REPORT*  Clinical Data: Weakness and pain.  PORTABLE CHEST - 1 VIEW  Comparison: 08/06/2012 and prior chest radiographs  Findings: The cardiomediastinal silhouette is unremarkable. There is no evidence of focal airspace disease, pulmonary edema, suspicious pulmonary nodule/mass, pleural effusion, or pneumothorax. No acute bony abnormalities are identified.  IMPRESSION: No evidence of acute cardiopulmonary disease.   Original Report Authenticated By: Harmon Pier, M.D.    Dg Chest Port 1 View  08/06/2012  *RADIOLOGY REPORT*  Clinical Data: Shortness of breath.  PORTABLE CHEST - 1 VIEW  Comparison: 01/15/2010.  Findings: There is apical lordotic positioning.  Cardiac silhouette is borderline in size. Ectasia, tortuosity, and nonaneurysmal calcification of the thoracic aorta are seen. There is chronic elevation of the right hemidiaphragm.  No pulmonary edema, pneumonia, or pleural effusion is seen.  There are osteophytes in the spine.  IMPRESSION: Borderline cardiac size with no evidence of pulmonary edema, pneumonia, or pleural effusion.   Original Report Authenticated By: Onalee Hua Call    Procedures:  Left arm PICC, TEE  Transthoracic Echo Left ventricle: The cavity size was normal. There was mild concentric hypertrophy. Systolic function was normal. The estimated ejection fraction was in the range of 60% to 65%. Wall motion was normal; there were no regional wall motion abnormalities. - Aortic valve: Mildly  calcified annulus. Trileaflet; mildly thickened, mildly calcified leaflets. Mild regurgitation. - Mitral valve: Calcified annulus. - Left atrium: The atrium was mildly dilated. - Atrial septum: No defect or patent foramen ovale was identified. - Pulmonary arteries: Systolic pressure was mildly increased. PA peak pressure: 46mm Hg (S). - Pericardium, extracardiac: A trivial pericardial effusion was identified  Transesophageal echocardiogram Left ventricle: Systolic function was normal. Wall motion was normal; there were no regional wall motion abnormalities. - Aortic valve: Moderate regurgitation. - Left atrium: No evidence of thrombus in the atrial cavity or appendage. - Right atrium: No evidence of thrombus in the atrial cavity or appendage. - Impressions: No evidence of SBE, vegetation or abscess. Moderate central AR Impressions:  - No evidence of SBE, vegetation or abscess. Moderate central AR   EKG: Sinus tachycardia Left anterior fascicular block Cannot rule out Anterior infarct , age undetermined  Full Code   Hospital Course: See  H&P for complete admission details. The patient is an 79 year old white male who presented with a two-day history of weakness chills and fever. In the emergency room, he had a temperature of 103F. Heart rate 107. Normal blood pressure. He was at some point lethargic, as he received an MRI of the brain on admission. It showed nothing acute but was motion degraded. No murmur noted. White blood cell count was 13,000. Chest x-ray negative. Patient quickly developed positive blood cultures showing gram-positive cocci in clusters. She was started on vancomycin. CT of the lumbar spine was done which showed no evidence of infection. Final cultures grew out methicillin sensitive staph aureus. Infectious disease was consulted remotely. They recommended transthoracic echocardiogram, and if no vegetation to consider transesophageal echocardiogram. The patient  had bolus and had no vegetation. He also complained of right shoulder pain after a fall prior to admission. This showed only degenerative arthritis. MRI of the L-spine was done, as he has had a laminectomy, searching for discitis. None was seen. Patient is quite debilitated and will benefit from skilled nursing facility for physical therapy occupational therapy and 3 more weeks of IV antibiotics. Infectious disease has recommended Ancef for a total of 4 weeks. A PICC line has been placed. Patient has been maintained mainly on Tylenol and Robaxin as needed over the past several days, as opiate analgesics were causing problems with somnolence and confusion. Total time on the day of discharge greater than 30 minutes.  Discharge Exam:  Blood pressure 119/56, pulse 86, temperature 98 F (36.7 C), temperature source Oral, resp. rate 18, height 5\' 5"  (1.651 m), weight 76.3 kg (168 lb 3.4 oz), SpO2 98.00%.  General: Alert. Oriented. Eating breakfast with the assistance of his wife. Lungs clear to auscultation bilaterally without wheeze rhonchi or rales Cardiovascular regular rate rhythm without murmurs gallops rubs Abdomen soft nontender nondistended Extremities no clubbing cyanosis or edema  Signed: Milan Clare L 08/14/2012, 8:42 AM

## 2012-08-15 LAB — CULTURE, BLOOD (ROUTINE X 2)

## 2012-08-16 ENCOUNTER — Encounter (HOSPITAL_COMMUNITY): Payer: Self-pay | Admitting: Cardiovascular Disease

## 2012-08-31 ENCOUNTER — Inpatient Hospital Stay: Payer: Medicare Other | Admitting: Internal Medicine

## 2012-09-08 ENCOUNTER — Ambulatory Visit (INDEPENDENT_AMBULATORY_CARE_PROVIDER_SITE_OTHER): Payer: Medicare Other | Admitting: Internal Medicine

## 2012-09-08 VITALS — BP 127/72 | HR 99 | Temp 97.6°F | Ht 64.0 in | Wt 164.0 lb

## 2012-09-08 DIAGNOSIS — B9561 Methicillin susceptible Staphylococcus aureus infection as the cause of diseases classified elsewhere: Secondary | ICD-10-CM

## 2012-09-08 DIAGNOSIS — A4901 Methicillin susceptible Staphylococcus aureus infection, unspecified site: Secondary | ICD-10-CM

## 2012-09-08 DIAGNOSIS — R7881 Bacteremia: Secondary | ICD-10-CM

## 2012-09-08 NOTE — Progress Notes (Signed)
[id number]   RFV: hospital follow up for complicated staph aureus bacteremia Subjective:    Patient ID: Paul Bradshaw, male    DOB: 22-Jan-1926, 79 y.o.   MRN: [id number]  HPI 79yo Male hx of stroke s/p stent placement he was  recent hospitalized for complicated staph aureus bacteremia, in setting of falling on right hip, now improved. Took antibiotics up until 3/15 to finish 4 wk course of cefazolin. No difficutlies with picc line nor antibiotics. No diarrhea. No fevers or chills since finishing antibiotics  Current Outpatient Prescriptions on File Prior to Visit  Medication Sig Dispense Refill  . acetaminophen 325 MG tablet Take 2 tablets (650 mg total) by mouth 3 (three) times daily.  30 tablet    . aspirin 325 MG tablet Take 325 mg by mouth daily.      Marland Kitchen senna-docusate (SENOKOT-S) 8.6-50 MG per tablet Take 1 tablet by mouth 2 (two) times daily.      . Tamsulosin HCl (FLOMAX) 0.4 MG CAPS Take 0.4 mg by mouth every evening.      Marland Kitchen ceFAZolin (ANCEF) 2-3 GM-% SOLR Inject 50 mLs (2 g total) into the vein every 8 (eight) hours. For 3 more weeks       No current facility-administered medications on file prior to visit.   Active Ambulatory Problems    Diagnosis Date Noted  . Staphylococcus aureus bacteremia 08/09/2012  . Back pain 08/09/2012  . Right shoulder pain 08/12/2012   Resolved Ambulatory Problems    Diagnosis Date Noted  . Fever 08/08/2012   Past Medical History  Diagnosis Date  . Stroke   . Renal disorder   . Arthritis    History  Substance Use Topics  . Smoking status: Never Smoker   . Smokeless tobacco: Not on file  . Alcohol Use: No  has no family status information on file.     Review of Systems  Constitutional: Negative for fever, chills, diaphoresis, activity change, appetite change, fatigue and unexpected weight change.  HENT: Negative for congestion, sore throat, rhinorrhea, sneezing, trouble swallowing and sinus pressure.  Eyes: Negative for photophobia  and visual disturbance.  Respiratory: Negative for cough, chest tightness, shortness of breath, wheezing and stridor.  Cardiovascular: Negative for chest pain, palpitations and leg swelling.  Gastrointestinal: Negative for nausea, vomiting, abdominal pain, diarrhea, constipation, blood in stool, abdominal distention and anal bleeding.  Genitourinary: Negative for dysuria, hematuria, flank pain and difficulty urinating.  Musculoskeletal: Negative for myalgias, back pain, joint swelling, arthralgias and gait problem.  Skin: Negative for color change, pallor, rash and wound.  Neurological: Negative for dizziness, tremors, weakness and light-headedness.  Hematological: Negative for adenopathy. Does not bruise/bleed easily.  Psychiatric/Behavioral: Negative for behavioral problems, confusion, sleep disturbance, dysphoric mood, decreased concentration and agitation.       Objective:   Physical Exam BP 127/72  Pulse 99  Temp(Src) 97.6 F (36.4 C) (Oral)  Ht 5\' 4"  (1.626 m)  Wt 164 lb (74.39 kg)  BMI 28.14 kg/m2 Physical Exam  Constitutional: He is oriented to person, place, and time. He appears well-developed and well-nourished. No distress.  HENT:  Mouth/Throat: Oropharynx is clear and moist. No oropharyngeal exudate.  Cardiovascular: Normal rate, regular rhythm and normal heart sounds. Exam reveals no gallop and no friction rub.  No murmur heard.  Pulmonary/Chest: Effort normal and breath sounds normal. No respiratory distress. He has no wheezes.  Abdominal: Soft. Bowel sounds are normal. He exhibits no distension. There is no tenderness.  Lymphadenopathy:  He has no cervical adenopathy.  Neurological: He is alert and oriented to person, place, and time.  Skin: Skin is warm and dry. No rash noted. No erythema.  Ext: left picc line c/d/i, no erythema Psychiatric: He has a normal mood and affect. His behavior is normal.       Assessment & Plan:  Complicated staph aureus bacteremia =  finished 28 days of antibiotics for staph aureus bacteremia. Doing well.  No longer needs antibiotics  Cc: n[facility], in [facility]

## 2012-09-08 NOTE — Progress Notes (Signed)
RN received verbal order to discontinue the patient's PICC line.  Patient identified with name and date of birth. PICC dressing removed, site unremarkable.  PICC line removed using sterile procedure @ 1200. PICC length equal to that noted in patient's hospital chart of 45 cm. Sterile petroleum gauze + sterile 4X4 applied to PICC site, pressure applied for 10 minutes and covered with Medipore tape as a pressure dressing. Patient tolerated procedure without complaints.  Patient instructed to limit use of arm for 1 hour. Patient instructed that the pressure dressing should remain in place for 24 hours. Patient verbalized understanding of these instructions.

## 2013-06-21 ENCOUNTER — Inpatient Hospital Stay (HOSPITAL_COMMUNITY)
Admission: EM | Admit: 2013-06-21 | Discharge: 2013-06-24 | DRG: 066 | Disposition: A | Discharge status: Home / Self Care | Payer: Medicare Other | Attending: Family Medicine | Admitting: Family Medicine

## 2013-06-21 ENCOUNTER — Emergency Department (HOSPITAL_COMMUNITY): Payer: Medicare Other

## 2013-06-21 ENCOUNTER — Encounter (HOSPITAL_COMMUNITY): Payer: Self-pay | Admitting: Emergency Medicine

## 2013-06-21 ENCOUNTER — Inpatient Hospital Stay (HOSPITAL_COMMUNITY): Payer: Medicare Other

## 2013-06-21 DIAGNOSIS — R202 Paresthesia of skin: Secondary | ICD-10-CM | POA: Diagnosis present

## 2013-06-21 DIAGNOSIS — M129 Arthropathy, unspecified: Secondary | ICD-10-CM | POA: Diagnosis present

## 2013-06-21 DIAGNOSIS — G8190 Hemiplegia, unspecified affecting unspecified side: Secondary | ICD-10-CM

## 2013-06-21 DIAGNOSIS — IMO0001 Reserved for inherently not codable concepts without codable children: Secondary | ICD-10-CM | POA: Diagnosis present

## 2013-06-21 DIAGNOSIS — I6529 Occlusion and stenosis of unspecified carotid artery: Secondary | ICD-10-CM | POA: Diagnosis present

## 2013-06-21 DIAGNOSIS — I639 Cerebral infarction, unspecified: Secondary | ICD-10-CM | POA: Diagnosis present

## 2013-06-21 DIAGNOSIS — Z820 Family history of epilepsy and other diseases of the nervous system: Secondary | ICD-10-CM

## 2013-06-21 DIAGNOSIS — R209 Unspecified disturbances of skin sensation: Secondary | ICD-10-CM

## 2013-06-21 DIAGNOSIS — R030 Elevated blood-pressure reading, without diagnosis of hypertension: Secondary | ICD-10-CM | POA: Diagnosis present

## 2013-06-21 DIAGNOSIS — N400 Enlarged prostate without lower urinary tract symptoms: Secondary | ICD-10-CM | POA: Diagnosis present

## 2013-06-21 DIAGNOSIS — I6350 Cerebral infarction due to unspecified occlusion or stenosis of unspecified cerebral artery: Principal | ICD-10-CM | POA: Diagnosis present

## 2013-06-21 DIAGNOSIS — Z8673 Personal history of transient ischemic attack (TIA), and cerebral infarction without residual deficits: Secondary | ICD-10-CM

## 2013-06-21 DIAGNOSIS — R531 Weakness: Secondary | ICD-10-CM | POA: Diagnosis present

## 2013-06-21 HISTORY — DX: Cerebral infarction due to unspecified occlusion or stenosis of unspecified carotid artery: I63.239

## 2013-06-21 HISTORY — DX: Bacteremia: R78.81

## 2013-06-21 HISTORY — DX: Benign prostatic hyperplasia without lower urinary tract symptoms: N40.0

## 2013-06-21 HISTORY — DX: Nonrheumatic aortic (valve) insufficiency: I35.1

## 2013-06-21 LAB — CBC WITH DIFFERENTIAL/PLATELET
Hemoglobin: 14.1 g/dL (ref 13.0–17.0)
Lymphocytes Relative: 24 % (ref 12–46)
Lymphs Abs: 1.6 10*3/uL (ref 0.7–4.0)
MCV: 95.9 fL (ref 78.0–100.0)
Monocytes Relative: 8 % (ref 3–12)
Neutrophils Relative %: 67 % (ref 43–77)
Platelets: 200 10*3/uL (ref 150–400)
RBC: 4.4 MIL/uL (ref 4.22–5.81)
WBC: 6.7 10*3/uL (ref 4.0–10.5)

## 2013-06-21 LAB — COMPREHENSIVE METABOLIC PANEL
ALT: 12 U/L (ref 0–53)
Alkaline Phosphatase: 74 U/L (ref 39–117)
BUN: 15 mg/dL (ref 6–23)
CO2: 31 mEq/L (ref 19–32)
GFR calc Af Amer: 74 mL/min — ABNORMAL LOW (ref >90)
GFR calc non Af Amer: 64 mL/min — ABNORMAL LOW (ref >90)
Glucose, Bld: 98 mg/dL (ref 70–99)
Potassium: 3.6 mEq/L — ABNORMAL LOW (ref 3.7–5.3)
Sodium: 141 mEq/L (ref 137–147)
Total Bilirubin: 0.6 mg/dL (ref 0.3–1.2)

## 2013-06-21 LAB — TROPONIN I: Troponin I: <0.3 ng/mL (ref <0.30)

## 2013-06-21 LAB — TSH: TSH: 2.508 u[IU]/mL (ref 0.350–4.500)

## 2013-06-21 MED ORDER — ONDANSETRON HCL 4 MG/2ML IJ SOLN: 4.0000 mg | Freq: Four times a day (QID) | INTRAMUSCULAR | Status: DC | PRN

## 2013-06-21 MED ORDER — ACETAMINOPHEN 325 MG PO TABS
650.0000 mg | ORAL_TABLET | Freq: Three times a day (TID) | ORAL | Status: DC
  Administered 2013-06-21 – 2013-06-23 (×8): 650 mg via ORAL
  Filled 2013-06-21 (×8): qty 2

## 2013-06-21 MED ORDER — POTASSIUM CHLORIDE CRYS ER 20 MEQ PO TBCR
20.0000 meq | EXTENDED_RELEASE_TABLET | Freq: Every day | ORAL | Status: AC
  Administered 2013-06-21 – 2013-06-22 (×2): 20 meq via ORAL
  Filled 2013-06-21 (×3): qty 1

## 2013-06-21 MED ORDER — OXYCODONE HCL 5 MG PO TABS: 5.0000 mg | ORAL_TABLET | ORAL | Status: DC | PRN

## 2013-06-21 MED ORDER — ATORVASTATIN CALCIUM 10 MG PO TABS
10.0000 mg | ORAL_TABLET | Freq: Every day | ORAL | Status: DC
  Administered 2013-06-21 – 2013-06-24 (×4): 10 mg via ORAL
  Filled 2013-06-21 (×4): qty 1

## 2013-06-21 MED ORDER — TAMSULOSIN HCL 0.4 MG PO CAPS
0.4000 mg | ORAL_CAPSULE | Freq: Every evening | ORAL | Status: DC
  Administered 2013-06-21 – 2013-06-24 (×4): 0.4 mg via ORAL
  Filled 2013-06-21 (×4): qty 1

## 2013-06-21 MED ORDER — HYDRALAZINE HCL 20 MG/ML IJ SOLN: 5.0000 mg | INTRAMUSCULAR | Status: DC | PRN

## 2013-06-21 MED ORDER — CLOPIDOGREL BISULFATE 75 MG PO TABS
75.0000 mg | ORAL_TABLET | Freq: Every day | ORAL | Status: DC
  Administered 2013-06-22 – 2013-06-24 (×3): 75 mg via ORAL
  Filled 2013-06-21 (×3): qty 1

## 2013-06-21 MED ORDER — ENOXAPARIN SODIUM 40 MG/0.4ML ~~LOC~~ SOLN
40.0000 mg | SUBCUTANEOUS | Status: DC
  Administered 2013-06-21 – 2013-06-23 (×3): 40 mg via SUBCUTANEOUS
  Filled 2013-06-21 (×3): qty 0.4

## 2013-06-21 MED ORDER — SENNOSIDES-DOCUSATE SODIUM 8.6-50 MG PO TABS: 1.0000 | ORAL_TABLET | Freq: Every evening | ORAL | Status: DC | PRN

## 2013-06-21 NOTE — ED Provider Notes (Signed)
CSN: [id number]     Arrival date & time 06/21/13  4098 History  This chart was scribed for Shelda Jakes, MD by Dorothey Baseman, ED Scribe. This patient was seen in room [facility]06/[facility]06 and the patient's care was started at 9:46 AM.    Chief Complaint  Patient presents with  . Numbness   The history is provided by the patient. No language interpreter was used.   HPI Comments: Paul Bradshaw is a 79 y.o. Male with a history of stroke who presents to the Emergency Department complaining of numbness and paresthesias to the right arm and extending into the right hand onset around 4 hours ago upon waking. He reports some associated difficulty gripping objects with his right hand. Patient also reports a brief episode of left-sided facial numbness that presented before the arm numbness, which resolved after a few minutes. He reports taking an aspirin at home this morning and states that his symptoms also resolved about 30 minutes after onset. Patient reports feeling similar symptoms extending from his right shoulder to his right elbow when he woke up yesterday morning, which also resolved on its own approximately 30 minutes after onset. He reports some associated congestion. He also reports some back pain that is normal for him secondary to arthritis and denies any recent changes. He denies fever, chills, visual disturbance, chest pain, shortness of breath, abdominal pain, dysuria, leg swelling, rash, neck pain, headache. Patient also has a history of renal disorder and arthritis.  Past Medical History  Diagnosis Date  . Stroke   . Renal disorder   . Arthritis    Past Surgical History  Procedure Laterality Date  . Carotid endarterectomy    . Kidney stone surgery    . Tee without cardioversion N/A 08/12/2012    Procedure: TRANSESOPHAGEAL ECHOCARDIOGRAM (TEE);  Surgeon: Wendall Stade, MD;  Location: [facility];  Service: Cardiovascular;  Laterality: N/A;  . Back surgery     No family history on  file. History  Substance Use Topics  . Smoking status: Never Smoker   . Smokeless tobacco: Not on file  . Alcohol Use: No    Review of Systems  Constitutional: Negative for fever and chills.  HENT: Positive for congestion.   Eyes: Negative for visual disturbance.  Respiratory: Negative for shortness of breath.   Cardiovascular: Negative for chest pain and leg swelling.  Gastrointestinal: Negative for abdominal pain.  Genitourinary: Negative for dysuria.  Musculoskeletal: Positive for back pain ( baseline). Negative for neck pain.  Skin: Negative for rash.  Neurological: Positive for weakness and numbness. Negative for headaches.  Psychiatric/Behavioral: Negative for confusion.    Allergies  Review of patient's allergies indicates no known allergies.  Home Medications   Current Outpatient Rx  Name  Route  Sig  Dispense  Refill  . Acetaminophen (APAP) 325 MG tablet   Oral   Take 650 mg by mouth 2 (two) times daily as needed for pain.         Marland Kitchen aspirin 325 MG tablet   Oral   Take 325 mg by mouth daily.         . Tamsulosin HCl (FLOMAX) 0.4 MG CAPS   Oral   Take 0.4 mg by mouth every evening.          Triage Vitals: BP 146/96  Temp(Src) 97.8 F (36.6 C) (Oral)  Resp 16  Ht 5\' 5"  (1.651 m)  Wt 180 lb (81.647 kg)  BMI 29.95 kg/m2  SpO2 100%  Physical Exam  Nursing note and vitals reviewed. Constitutional: He is oriented to person, place, and time. He appears well-developed and well-nourished. No distress.  HENT:  Head: Normocephalic and atraumatic.  Eyes: Conjunctivae and EOM are normal.  Neck: Normal range of motion. Neck supple.  Cardiovascular: Normal rate, regular rhythm and normal heart sounds.   No murmur heard. Pulses:      Radial pulses are 2+ on the right side.  Capillary refill is < 2 seconds.   Pulmonary/Chest: Effort normal and breath sounds normal. No respiratory distress.  Abdominal: Soft. Bowel sounds are normal. He exhibits no  distension. There is no tenderness.  Musculoskeletal: Normal range of motion. He exhibits no edema.  No swelling to the ankles.   Neurological: He is alert and oriented to person, place, and time. No cranial nerve deficit. He exhibits normal muscle tone. Coordination normal.  Skin: Skin is warm and dry.  Psychiatric: He has a normal mood and affect. His behavior is normal.    ED Course  Procedures (including critical care time)  DIAGNOSTIC STUDIES: Oxygen Saturation is 100% on room air, normal by my interpretation.    COORDINATION OF CARE: 9:59 AM- Will order a CT of the head, a chest x-ray, and blood labs. Discussed treatment plan with patient at bedside and patient verbalized agreement.    Results for orders placed during the hospital encounter of 06/21/13  CBC WITH DIFFERENTIAL      Result Value Range   WBC 6.7  4.0 - 10.5 K/uL   RBC 4.40  4.22 - 5.81 MIL/uL   Hemoglobin 14.1  13.0 - 17.0 g/dL   HCT 45.4  09.8 - 11.9 %   MCV 95.9  78.0 - 100.0 fL   MCH 32.0  26.0 - 34.0 pg   MCHC 33.4  30.0 - 36.0 g/dL   RDW 14.7  82.9 - 56.2 %   Platelets 200  150 - 400 K/uL   Neutrophils Relative % 67  43 - 77 %   Neutro Abs 4.5  1.7 - 7.7 K/uL   Lymphocytes Relative 24  12 - 46 %   Lymphs Abs 1.6  0.7 - 4.0 K/uL   Monocytes Relative 8  3 - 12 %   Monocytes Absolute 0.6  0.1 - 1.0 K/uL   Eosinophils Relative 1  0 - 5 %   Eosinophils Absolute 0.1  0.0 - 0.7 K/uL   Basophils Relative 0  0 - 1 %   Basophils Absolute 0.0  0.0 - 0.1 K/uL  COMPREHENSIVE METABOLIC PANEL      Result Value Range   Sodium 141  137 - 147 mEq/L   Potassium 3.6 (*) 3.7 - 5.3 mEq/L   Chloride 101  96 - 112 mEq/L   CO2 31  19 - 32 mEq/L   Glucose, Bld 98  70 - 99 mg/dL   BUN 15  6 - 23 mg/dL   Creatinine, Ser 1.30  0.50 - 1.35 mg/dL   Calcium 9.0  8.4 - 86.5 mg/dL   Total Protein 6.9  6.0 - 8.3 g/dL   Albumin 3.9  3.5 - 5.2 g/dL   AST 19  0 - 37 U/L   ALT 12  0 - 53 U/L   Alkaline Phosphatase 74  39 - 117  U/L   Total Bilirubin 0.6  0.3 - 1.2 mg/dL   GFR calc non Af Amer 64 (*) >90 mL/min   GFR calc Af Amer 74 (*) >90 mL/min  Dg Chest 2 View  06/21/2013   CLINICAL DATA:  Numbness in the right shoulder and arm.  EXAM: CHEST  2 VIEW  COMPARISON:  08/13/2012  FINDINGS: Continued left ventricular prominence. Atherosclerosis an unfolding of the aorta up. Slightly increased interstitial lung markings. There is a 5 mm nodular shadow projected over the right lower lung not seen on previous films with certainty. This could represent a nipple shadow. Nipple shadow radiograph would be suggested to differentiate this from a true pulmonary nodule. There is no evidence of consolidation or collapse. No effusions. No significant bony finding.  IMPRESSION: Probably no active disease. Left ventricular prominence. Atherosclerotic unfolded aorta up. 5 mm nodular shadow projected over the right lower lung probably representing a nipple shadow. Nipple marker radiographs suggested to differentiate this from a pulmonary nodule.   Electronically Signed   By: Paulina Fusi M.D.   On: 06/21/2013 10:25   Ct Head Wo Contrast  06/21/2013   CLINICAL DATA:  Numbness  EXAM: CT HEAD WITHOUT CONTRAST  TECHNIQUE: Contiguous axial images were obtained from the base of the skull through the vertex without intravenous contrast.  COMPARISON:  08/06/2012  FINDINGS: No skull fracture is noted. Paranasal sinuses and mastoid air cells are unremarkable. Atherosclerotic calcifications of carotid siphon. Stable cerebral atrophy. No intracranial hemorrhage, mass effect or midline shift. Stable periventricular and patchy subcortical chronic white matter disease. No acute cortical infarction. No mass lesion is noted on this unenhanced scan.  IMPRESSION: No acute intracranial abnormality. Stable atrophy and chronic white matter disease.   Electronically Signed   By: Natasha Mead M.D.   On: 06/21/2013 10:35     EKG Interpretation    Date/Time:  Tuesday  June 21 2013 09:17:27 EST Ventricular Rate:  91 PR Interval:  186 QRS Duration: 92 QT Interval:  376 QTC Calculation: 462 R Axis:   -54 Text Interpretation:  Normal sinus rhythm Left axis deviation Abnormal ECG When compared with ECG of 06-Aug-2012 16:38, No significant change was found Confirmed by Kendric Sindelar  MD, Mariska Daffin ([id number]) on 06/21/2013 9:39:03 AM            MDM   1. CVA (cerebral infarction)    Patient's right arm symptoms more consistent perhaps with the arm falling asleep or nerve entrapment and shoulder area. However CT was negative but MRI shows bilateral acute frontal infarct. Not entirely clear if patient's symptoms are related to this. Currently all symptoms have resolved which would based on symptoms make it a TIA now we'll discuss with hospitalist for admission.  I personally performed the services described in this documentation, which was scribed in my presence. The recorded information has been reviewed and is accurate.      Shelda Jakes, MD 06/21/13 1226

## 2013-06-21 NOTE — H&P (Signed)
[facility] History and Physical  Paul Bradshaw JXB:[id number] DOB: 04-29-1926 DOA: 06/21/2013  Referring physician: ED physician, Dr. Corey Harold PCP: Josue Hector, MD   Chief Complaint: Right-sided numbness  HPI: Paul Bradshaw is a 79 y.o. male with a history of a stroke in either 2005 2006, carotid artery stenosis which was felt to lead to the stroke, status post carotid endarterectomy, and BPH, who presents to the emergency department today with a chief complaint of right-sided numbness. He started having numbness and mild weakness in his right shoulder and his right arm yesterday morning. He was also having difficulty picking up items with his right hand. (He is right-handed). He took a 325 mg aspirin as he does every morning. Throughout the day yesterday, the numbness and weakness when away. However, early this morning, he was having trouble picking up his cell phone. He was also having difficulty dialing the number with his right hand. The numbness in his right arm and hand recurred. He felt something was wrong and presented to the emergency department today. In review, he denies headache, dizziness, difficulty swallowing, difficulty chewing, or difficulty speaking. His wife states that his speaking and his thought processes are normal. He has had some blurred vision and double vision, but this is not new. He attributes the visual changes to recent cataract surgery. He denies chest pain, palpitations, and shortness of breath. He has no symptoms on his left side.  In the emergency department, he is afebrile and hemodynamically stable although he is hypertensive. His blood pressure has been ranging from 146/96-181/71. His lab data are virtually unremarkable. Chest x-ray reveals probably no active disease, but there is left ventricular prominence and a 5 mm nodular shadow projected over the right lower lung probably representing a nipple shadow. His EKG reveals normal sinus rhythm, left  axis deviation, and a heart rate of 91 beats per minute. CT of his head reveals no acute intracranial abnormality, but the MRI of his brain reveals tiny bilateral frontal lobe infarcts. He is being admitted for further evaluation and management.    Review of Systems:  Constitutional:  No weight loss, night sweats, Fevers, chills, fatigue.  HEENT:  No headaches, Difficulty swallowing,Tooth/dental problems,Sore throat,  No sneezing, itching, ear ache, nasal congestion, post nasal drip,  Cardio-vascular:  No chest pain, Orthopnea, PND, swelling in lower extremities, anasarca, dizziness, palpitations  GI:  No heartburn, indigestion, abdominal pain, nausea, vomiting, diarrhea, change in bowel habits, loss of appetite  Resp:  No shortness of breath with exertion or at rest. No excess mucus, no productive cough, No non-productive cough, No coughing up of blood.No change in color of mucus.No wheezing.No chest wall deformity  Skin:  no rash or lesions.  GU:  no dysuria, change in color of urine, no urgency or frequency. No flank pain.  Musculoskeletal:  No joint pain or swelling. He has bilateral shoulder and back pain. Psych:  No change in mood or affect. No depression or anxiety. No memory loss.   Past Medical History  Diagnosis Date  . Stroke 2005 or 2006  . Symptomatic carotid artery stenosis with infarction 2005 or 2006    Status post right carotid endarterectomy  . Arthritis   . Staphylococcus aureus bacteremia 08/09/2012    Blood Cultures pos X2, GPC in clusters   . BPH (benign prostatic hyperplasia)    Past Surgical History  Procedure Laterality Date  . Carotid endarterectomy    . Kidney stone surgery    . Tee without  cardioversion N/A 08/12/2012    Procedure: TRANSESOPHAGEAL ECHOCARDIOGRAM (TEE);  Surgeon: Wendall Stade, MD;  Location: [facility];  Service: Cardiovascular;  Laterality: N/A;  . Back surgery     Social History: He is married. He lives in [place]. He has one  son. He is retired. He denies tobacco, alcohol, and illicit drug use. He still drives.   No Known Allergies  Family history: His mother died of Alzheimer's disease and old age in her 6s. His father died in his 26s of emphysema.   Prior to Admission medications   Medication Sig Start Date End Date Taking? Authorizing Provider  Acetaminophen (APAP) 325 MG tablet Take 650 mg by mouth 2 (two) times daily as needed for pain. 08/14/12  Yes Christiane Ha, MD  aspirin 325 MG tablet Take 325 mg by mouth daily.   Yes Historical Provider, MD  Tamsulosin HCl (FLOMAX) 0.4 MG CAPS Take 0.4 mg by mouth every evening.   Yes Historical Provider, MD   Physical Exam: Filed Vitals:   06/21/13 1245  BP:   Pulse:   Temp: 98.2 F (36.8 C)  Resp:     BP 181/71  Pulse 61  Temp(Src) 98.2 F (36.8 C) (Oral)  Resp 13  Ht 5\' 5"  (1.651 m)  Wt 81.647 kg (180 lb)  BMI 29.95 kg/m2  SpO2 100%  General:  Appears calm and comfortable. No acute distress. Eyes: PERRL, normal lids, irises & conjunctiva ENT: grossly normal hearing, lips & tongue Neck: no LAD, masses or thyromegaly. No audible bruit or JVD. Cardiovascular: RRR, no m/r/g. No LE edema. Telemetry: SR, no arrhythmias  Respiratory: CTA bilaterally, no w/r/r. Normal respiratory effort. Abdomen: soft, positive bowel sounds, soft, nontender, nondistended. Skin: no rash or induration seen on limited exam Musculoskeletal: grossly normal tone BUE/BLE Psychiatric: grossly normal mood and affect, speech fluent and appropriate Neurologic: He is alert and oriented x3. Cranial nerves II through XII are grossly intact. Pronator drift is negative. Cerebellar with finger-to-nose testing is within normal limits bilaterally. Strength is 5 over 5 bilaterally and symmetric. Sensation is grossly intact bilaterally.           Labs on Admission:  Basic Metabolic Panel:  Recent Labs Lab 06/21/13 0930  NA 141  K 3.6*  CL 101  CO2 31  GLUCOSE 98  BUN 15   CREATININE 1.03  CALCIUM 9.0   Liver Function Tests:  Recent Labs Lab 06/21/13 0930  AST 19  ALT 12  ALKPHOS 74  BILITOT 0.6  PROT 6.9  ALBUMIN 3.9   No results found for this basename: LIPASE, AMYLASE,  in the last 168 hours No results found for this basename: AMMONIA,  in the last 168 hours CBC:  Recent Labs Lab 06/21/13 0930  WBC 6.7  NEUTROABS 4.5  HGB 14.1  HCT 42.2  MCV 95.9  PLT 200   Cardiac Enzymes: No results found for this basename: CKTOTAL, CKMB, CKMBINDEX, TROPONINI,  in the last 168 hours  BNP (last 3 results) No results found for this basename: PROBNP,  in the last 8760 hours CBG: No results found for this basename: GLUCAP,  in the last 168 hours  Radiological Exams on Admission: Dg Chest 2 View  06/21/2013   CLINICAL DATA:  Numbness in the right shoulder and arm.  EXAM: CHEST  2 VIEW  COMPARISON:  08/13/2012  FINDINGS: Continued left ventricular prominence. Atherosclerosis an unfolding of the aorta up. Slightly increased interstitial lung markings. There is a 5 mm  nodular shadow projected over the right lower lung not seen on previous films with certainty. This could represent a nipple shadow. Nipple shadow radiograph would be suggested to differentiate this from a true pulmonary nodule. There is no evidence of consolidation or collapse. No effusions. No significant bony finding.  IMPRESSION: Probably no active disease. Left ventricular prominence. Atherosclerotic unfolded aorta up. 5 mm nodular shadow projected over the right lower lung probably representing a nipple shadow. Nipple marker radiographs suggested to differentiate this from a pulmonary nodule.   Electronically Signed   By: Paulina Fusi M.D.   On: 06/21/2013 10:25   Ct Head Wo Contrast  06/21/2013   CLINICAL DATA:  Numbness  EXAM: CT HEAD WITHOUT CONTRAST  TECHNIQUE: Contiguous axial images were obtained from the base of the skull through the vertex without intravenous contrast.  COMPARISON:   08/06/2012  FINDINGS: No skull fracture is noted. Paranasal sinuses and mastoid air cells are unremarkable. Atherosclerotic calcifications of carotid siphon. Stable cerebral atrophy. No intracranial hemorrhage, mass effect or midline shift. Stable periventricular and patchy subcortical chronic white matter disease. No acute cortical infarction. No mass lesion is noted on this unenhanced scan.  IMPRESSION: No acute intracranial abnormality. Stable atrophy and chronic white matter disease.   Electronically Signed   By: Natasha Mead M.D.   On: 06/21/2013 10:35   Mr Brain Wo Contrast  06/21/2013   CLINICAL DATA:  Headache. Right arm numbness. No known injury. Renal disorder.  EXAM: MRI HEAD WITHOUT CONTRAST  TECHNIQUE: Multiplanar, multiecho pulse sequences of the brain and surrounding structures were obtained without intravenous contrast.  COMPARISON:  06/21/2013 CT.  08/06/2012 MR.  FINDINGS: Some of the sequences are motion degraded.  Tiny bilateral frontal lobe acute infarcts.  No intracranial hemorrhage.  Moderate small vessel disease type changes.  Global atrophy without hydrocephalus.  No intracranial mass lesion noted on this unenhanced exam.  Major intracranial vascular structures are patent.  Opacification left frontal -ethmoid sinus air cell (cannot rule out mucocele). Mild mucosal thickening remainder of ethmoid sinus air cells.  Transverse ligament hypertrophy. C2-3 bulge with mild spinal stenosis. Cervical medullary junction, pituitary region, pineal region and orbital structures unremarkable.  IMPRESSION: Tiny bilateral frontal lobe infarcts.  Please see above for additional findings.  These results were called by telephone at the time of interpretation on 06/21/2013 at 12:15 PM to Dr. Vanetta Mulders , who verbally acknowledged these results.   Electronically Signed   By: Bridgett Larsson M.D.   On: 06/21/2013 12:16    EKG: Independently reviewed. As above in history of present  illness.  Assessment/Plan Principal Problem:   Acute ischemic stroke Active Problems:   Acute right-sided weakness   Paresthesia of right arm   Elevated blood pressure   1. Acute bilateral frontal small strokes. The patient has a  previous history of stroke from carotid artery stenosis. He had residual right-sided weakness then  which did resolve. He also had a right carotid endarterectomy in [place] in either 2005 at 2006. He had been taking aspirin daily, and therefore, there is an aspirin failure. His initial right-sided paresthesias and clumsiness right hand has resolved. Currently, he has no obvious neurologic deficits. Nevertheless, he will be admitted for further evaluation and management. Of note, he is certainly out of the window for TPA given that his symptoms started more than 24 hours ago. 2. Elevated blood pressure. The patient has no known history of hypertension. The elevation may be compensatory from the acute strokes. We'll  hold on starting scheduled antihypertensive medications for now. He is blood pressures will be monitored over the next 24-48 hours. We will add when necessary hydralazine for systolic blood pressure of greater than 195. We'll avoid significant decreases in his blood pressure due to the strokes.       Plan: 1. Will stop aspirin and start Plavix. 2. We'll start Lipitor empirically. We'll order a fasting lipid panel. 3. IV when necessary hydralazine for systolic blood pressure greater than 195. We'll monitor his blood pressures closely to see if he requires chronic management of hypertension. 4. Consult neurology. We'll consult the physical therapist and occupational therapist. 5. For further evaluation, we'll order a 2-D echocardiogram, carotid ultrasound, troponin I., lipid panel, hemoglobin A1c, and TSH.    Code Status: Full code Family Communication: Discuss with his wife Disposition Plan: Anticipate discharge to home in the next 24-48  hours.  Time spent: One hour.  [facility] Pager [phone]

## 2013-06-21 NOTE — Plan of Care (Signed)
Problem: Consults Goal: Ischemic Stroke Patient Education See Patient Education Module for education specifics.  Outcome: Completed/Met Date Met:  06/21/13 [facility] information given.

## 2013-06-21 NOTE — Progress Notes (Signed)
UR chart review completed.  

## 2013-06-21 NOTE — ED Notes (Signed)
Went to bed last night around 1030 pm.  Woke up  This morning with numbness to right arm and difficulty gripping with right hand.  Reports took ASA 325mg  this morning.  Reports same feeling upon waking yesterday morning but got better through the day yesterday.  Grip strength equal in triage.  Face symmetrical, speech clear.

## 2013-06-22 ENCOUNTER — Inpatient Hospital Stay (HOSPITAL_COMMUNITY): Payer: Medicare Other

## 2013-06-22 ENCOUNTER — Inpatient Hospital Stay (HOSPITAL_COMMUNITY)
Admit: 2013-06-22 | Discharge: 2013-06-22 | Disposition: A | Discharge status: Home / Self Care | Payer: Medicare Other | Attending: Neurology | Admitting: Neurology

## 2013-06-22 ENCOUNTER — Encounter (HOSPITAL_COMMUNITY): Payer: Self-pay | Admitting: Cardiology

## 2013-06-22 DIAGNOSIS — R030 Elevated blood-pressure reading, without diagnosis of hypertension: Secondary | ICD-10-CM

## 2013-06-22 DIAGNOSIS — I359 Nonrheumatic aortic valve disorder, unspecified: Secondary | ICD-10-CM

## 2013-06-22 LAB — LIPID PANEL
Cholesterol: 160 mg/dL (ref 0–200)
LDL Cholesterol: 71 mg/dL (ref 0–99)
VLDL: 25 mg/dL (ref 0–40)

## 2013-06-22 LAB — CBC
HCT: 39.2 % (ref 39.0–52.0)
Hemoglobin: 13 g/dL (ref 13.0–17.0)
MCH: 31.7 pg (ref 26.0–34.0)
Platelets: 179 10*3/uL (ref 150–400)
WBC: 5.5 10*3/uL (ref 4.0–10.5)

## 2013-06-22 LAB — BASIC METABOLIC PANEL
Calcium: 8.7 mg/dL (ref 8.4–10.5)
Chloride: 102 mEq/L (ref 96–112)
GFR calc Af Amer: 87 mL/min — ABNORMAL LOW (ref >90)
Potassium: 3.7 mEq/L (ref 3.7–5.3)

## 2013-06-22 NOTE — Progress Notes (Signed)
*  PRELIMINARY RESULTS* Echocardiogram 2D Echocardiogram has been performed.  Paul Bradshaw 06/22/2013, 4:42 PM

## 2013-06-22 NOTE — Evaluation (Signed)
Occupational Therapy Evaluation Patient Details Name: Paul Bradshaw MRN: [id number] DOB: [birth year] Today's Date: 06/22/2013 Time: 0840-0900 OT Time Calculation (min): 20 min  OT Assessment / Plan / Recommendation History of present illness Patient is an 79 year old male admitted with right sided weakness.  Orders received for OT eval and treat   Clinical Impression   Patient is an 79 yo right handed male who presents at baseline with all ADL tasks. He has excellent family support at home. Recommend no skilled OT services at this time.      OT Assessment  Patient does not need any further OT services    Follow Up Recommendations  No OT follow up    Barriers to Discharge      Equipment Recommendations       Recommendations for Other Services    Frequency       Precautions / Restrictions Precautions Precautions: None       ADL  Eating/Feeding: Modified independent (dentures) Where Assessed - Eating/Feeding: Bed level Grooming: Wash/dry hands;Wash/dry face;Independent Where Assessed - Grooming: Unsupported standing Lower Body Dressing: Independent Toilet Transfer: Independent Toilet Transfer Method: Stand pivot Acupuncturist: Comfort height toilet Toileting - Clothing Manipulation and Hygiene: Independent      OT Goals(Current goals can be found in the care plan section) Acute Rehab OT Goals Patient Stated Goal: to get back home  OT Goal Formulation: With patient  Visit Information  History of Present Illness: Patient is an 79 year old male admitted with right sided weakness.  Orders received for OT eval and treat       Prior Functioning     Home Living Family/patient expects to be discharged to:: Private residence Living Arrangements: Spouse/significant other Available Help at Discharge: Family;Available 24 hours/day Home Access: Ramped entrance Home Layout: One level Home Equipment: None Prior Function Level of Independence:  Independent Communication Communication: No difficulties Dominant Hand: Right         Vision/Perception Vision - History Baseline Vision: No visual deficits Patient Visual Report: No change from baseline Vision - Assessment Eye Alignment: Within Functional Limits   Cognition  Cognition Arousal/Alertness: Awake/alert Behavior During Therapy: WFL for tasks assessed/performed Overall Cognitive Status: Within Functional Limits for tasks assessed    Extremity/Trunk Assessment Upper Extremity Assessment Upper Extremity Assessment: Overall WFL for tasks assessed;Generalized weakness (weakness LUE proximal shoulder 3/5 remaining 4/5) Lower Extremity Assessment Lower Extremity Assessment: Defer to PT evaluation     Mobility Bed Mobility Bed Mobility: Rolling Right Rolling Right: With rail;7: Independent Transfers Transfers: Sit to Stand;Stand to Sit Sit to Stand: 7: Independent;With upper extremity assist Stand to Sit: 7: Independent;With upper extremity assist     Exercise     Balance     End of Session OT - End of Session Equipment Utilized During Treatment: Gait belt Activity Tolerance: Patient tolerated treatment well Patient left: in bed;with family/visitor present  GO     Velora Mediate, OTR/L 06/22/2013, 9:01 AM

## 2013-06-22 NOTE — Consult Note (Signed)
[facility] Shanel Prazak A. Gerilyn Pilgrim, MD     [email or web address]          Paul Bradshaw is an 79 y.o. male.   ASSESSMENT/PLAN: 1. MRI findings of bilateral increased signal suggestive of bihemispheric tiny infarcts. This raises the concern of cardioembolic phenomenon. This is also additionally concerning given that the patient had bacteremia 10 months ago although TEE at that time showed no vegetations. 2. Left carotid stenosis in the 50-69% range. This is a great area were surgery may be considered. However, given the patient's bihemispheric infarcts and his age I would not recommend surgery at this time or interventional therapies such as angioplasty.  Recommendations: 1. The patient has a trance thoracic echocardiography in range. I think however that he will need to have a transesophageal echocardiography given the MRI findings of bilateral hemispheric disease/infarcts. The patient can be maintained on dual antiplatelet agents for now. If the echo is unremarkable, I would suggest that this be continued for 3 months and the subsequent to this he can be maintained on a single antiplatelet therapy possibly Plavix since she was previously on aspirin. The patient has been started on a statin medication which I think is appropriate given his carotid stenosis. An EEG will be obtained as there is always a possibility of the patient having post stroke seizures.  The patient is an 79 year old right-handed white male who presents with the acute onset of numbness and tingling involving the posterior right arm. The event lasted for about 30 minutes or so. This happened about 2 days ago. He woke up the following day with similar symptoms but this time having weakness and incoordination of the right hand and right forearm. She reports some suggestion of stiffening of the hand. Again, the event lasted for about 30-60 minutes and then completely resolved afterwards. The patient does not report having  dysarthria, dysphagia, diplopia, dizziness, chest pain or other symptoms. GI GU symptoms are not reported. The patient previously had marked right hemiparesis several years ago. He was treated in [facility]. It appears that the patient had a 95% carotid stenosis on the nonsymptomatic right the patient underwent endarterectomy for this. He did well with this procedure. The patient's right-sided hemiparesis resolved. He has been maintained on aspirin. He was not on any anticholesterol medications. He reports being compliant with his aspirin. Review of systems otherwise negative at the [facility] stated above.  GENERAL: This a pleasant man in no acute distress.  HEENT: Supple. Atraumatic normocephalic.   ABDOMEN: soft  EXTREMITIES: No edema   BACK: Normal.  SKIN: Normal by inspection.    MENTAL STATUS: Alert and oriented. Speech, language and cognition are generally intact. Judgment and insight normal. Naming, comprehension and fluency are all intact. He follows commands well.  CRANIAL NERVES: Pupils are equal, round and reactive to light and accommodation; extra ocular movements are full, there is no significant nystagmus; visual fields are full; upper and lower facial muscles are normal in strength and symmetric, there is no flattening of the nasolabial folds; tongue is midline; uvula is midline; shoulder elevation is normal.  MOTOR: Normal tone, bulk and strength; no pronator drift.  COORDINATION: Left finger to nose is normal, right finger to nose is normal, No rest tremor; no intention tremor; no postural tremor; no bradykinesia.  REFLEXES: Deep tendon reflexes are symmetrical and normal. Babinski reflexes are flexor bilaterally.   SENSATION: Normal to light touch.   Past Medical History  Diagnosis Date  . Stroke 2005 or 2006  .  Symptomatic carotid artery stenosis with infarction 2005 or 2006    Status post right carotid endarterectomy  . Arthritis   . Staphylococcus aureus  bacteremia 08/09/2012    Blood Cultures pos X2, GPC in clusters   . BPH (benign prostatic hyperplasia)     Past Surgical History  Procedure Laterality Date  . Carotid endarterectomy    . Kidney stone surgery    . Tee without cardioversion N/A 08/12/2012    Procedure: TRANSESOPHAGEAL ECHOCARDIOGRAM (TEE);  Surgeon: Wendall Stade, MD;  Location: [facility];  Service: Cardiovascular;  Laterality: N/A;  . Back surgery      Family History  Problem Relation Age of Onset  . Emphysema Father   . Alzheimer's disease Mother     Social History:  reports that he has never smoked. He does not have any smokeless tobacco history on file. He reports that he does not drink alcohol or use illicit drugs.  Allergies: No Known Allergies  Medications: Prior to Admission medications   Medication Sig Start Date End Date Taking? Authorizing Provider  Acetaminophen (APAP) 325 MG tablet Take 650 mg by mouth 2 (two) times daily as needed for pain. 08/14/12  Yes Christiane Ha, MD  aspirin 325 MG tablet Take 325 mg by mouth daily.   Yes Historical Provider, MD  Tamsulosin HCl (FLOMAX) 0.4 MG CAPS Take 0.4 mg by mouth every evening.   Yes Historical Provider, MD     Scheduled Meds: . acetaminophen  650 mg Oral TID  . atorvastatin  10 mg Oral q1800  . clopidogrel  75 mg Oral Q breakfast  . enoxaparin (LOVENOX) injection  40 mg Subcutaneous Q24H  . potassium chloride  20 mEq Oral Daily  . tamsulosin  0.4 mg Oral QPM   Continuous Infusions:  PRN Meds:.hydrALAZINE, ondansetron (ZOFRAN) IV, oxyCODONE, senna-docusate   Blood pressure 170/92, pulse 71, temperature 97.3 F (36.3 C), temperature source Oral, resp. rate 20, height 5\' 5"  (1.651 m), weight 81.647 kg (180 lb), SpO2 98.00%.   Results for orders placed during the hospital encounter of 06/21/13 (from the past 48 hour(s))  CBC WITH DIFFERENTIAL     Status: None   Collection Time    06/21/13  9:30 AM      Result Value Range   WBC 6.7  4.0  - 10.5 K/uL   RBC 4.40  4.22 - 5.81 MIL/uL   Hemoglobin 14.1  13.0 - 17.0 g/dL   HCT 62.1  30.8 - 65.7 %   MCV 95.9  78.0 - 100.0 fL   MCH 32.0  26.0 - 34.0 pg   MCHC 33.4  30.0 - 36.0 g/dL   RDW 84.6  96.2 - 95.2 %   Platelets 200  150 - 400 K/uL   Neutrophils Relative % 67  43 - 77 %   Neutro Abs 4.5  1.7 - 7.7 K/uL   Lymphocytes Relative 24  12 - 46 %   Lymphs Abs 1.6  0.7 - 4.0 K/uL   Monocytes Relative 8  3 - 12 %   Monocytes Absolute 0.6  0.1 - 1.0 K/uL   Eosinophils Relative 1  0 - 5 %   Eosinophils Absolute 0.1  0.0 - 0.7 K/uL   Basophils Relative 0  0 - 1 %   Basophils Absolute 0.0  0.0 - 0.1 K/uL  COMPREHENSIVE METABOLIC PANEL     Status: Abnormal   Collection Time    06/21/13  9:30 AM  Result Value Range   Sodium 141  137 - 147 mEq/L   Comment: Please note change in reference range.   Potassium 3.6 (*) 3.7 - 5.3 mEq/L   Comment: Please note change in reference range.   Chloride 101  96 - 112 mEq/L   CO2 31  19 - 32 mEq/L   Glucose, Bld 98  70 - 99 mg/dL   BUN 15  6 - 23 mg/dL   Creatinine, Ser 7.82  0.50 - 1.35 mg/dL   Calcium 9.0  8.4 - 95.6 mg/dL   Total Protein 6.9  6.0 - 8.3 g/dL   Albumin 3.9  3.5 - 5.2 g/dL   AST 19  0 - 37 U/L   ALT 12  0 - 53 U/L   Alkaline Phosphatase 74  39 - 117 U/L   Total Bilirubin 0.6  0.3 - 1.2 mg/dL   GFR calc non Af Amer 64 (*) >90 mL/min   GFR calc Af Amer 74 (*) >90 mL/min   Comment: (NOTE)     The eGFR has been calculated using the CKD EPI equation.     This calculation has not been validated in all clinical situations.     eGFR's persistently <90 mL/min signify possible Chronic Kidney     Disease.  TROPONIN I     Status: None   Collection Time    06/21/13  1:55 PM      Result Value Range   Troponin I <0.30  <0.30 ng/mL   Comment:            Due to the release kinetics of cTnI,     a negative result within the first hours     of the onset of symptoms does not rule out     myocardial infarction with certainty.       If myocardial infarction is still suspected,     repeat the test at appropriate intervals.  TSH     Status: None   Collection Time    06/21/13  1:55 PM      Result Value Range   TSH 2.508  0.350 - 4.500 uIU/mL   Comment: Performed at [facility]  LIPID PANEL     Status: None   Collection Time    06/22/13  5:03 AM      Result Value Range   Cholesterol 160  0 - 200 mg/dL   Triglycerides 213  <086 mg/dL   HDL 64  >57 mg/dL   Total CHOL/HDL Ratio 2.5     VLDL 25  0 - 40 mg/dL   LDL Cholesterol 71  0 - 99 mg/dL   Comment:            Total Cholesterol/HDL:CHD Risk     Coronary Heart Disease Risk Table                         Men   Women      1/2 Average Risk   3.4   3.3      Average Risk       5.0   4.4      2 X Average Risk   9.6   7.1      3 X Average Risk  23.4   11.0                Use the calculated Patient Ratio     above and the CHD Risk Table  to determine the patient's CHD Risk.                ATP III CLASSIFICATION (LDL):      <100     mg/dL   Optimal      161-096  mg/dL   Near or Above                        Optimal      130-159  mg/dL   Borderline      045-409  mg/dL   High      >811     mg/dL   Very High  BASIC METABOLIC PANEL     Status: Abnormal   Collection Time    06/22/13  5:03 AM      Result Value Range   Sodium 141  137 - 147 mEq/L   Comment: Please note change in reference range.   Potassium 3.7  3.7 - 5.3 mEq/L   Comment: Please note change in reference range.   Chloride 102  96 - 112 mEq/L   CO2 28  19 - 32 mEq/L   Glucose, Bld 96  70 - 99 mg/dL   BUN 14  6 - 23 mg/dL   Creatinine, Ser 9.14  0.50 - 1.35 mg/dL   Calcium 8.7  8.4 - 78.2 mg/dL   GFR calc non Af Amer 75 (*) >90 mL/min   GFR calc Af Amer 87 (*) >90 mL/min   Comment: (NOTE)     The eGFR has been calculated using the CKD EPI equation.     This calculation has not been validated in all clinical situations.     eGFR's persistently <90 mL/min signify possible Chronic  Kidney     Disease.  CBC     Status: Abnormal   Collection Time    06/22/13  5:03 AM      Result Value Range   WBC 5.5  4.0 - 10.5 K/uL   RBC 4.10 (*) 4.22 - 5.81 MIL/uL   Hemoglobin 13.0  13.0 - 17.0 g/dL   HCT 95.6  21.3 - 08.6 %   MCV 95.6  78.0 - 100.0 fL   MCH 31.7  26.0 - 34.0 pg   MCHC 33.2  30.0 - 36.0 g/dL   RDW 57.8  46.9 - 62.9 %   Platelets 179  150 - 400 K/uL    Dg Chest 2 View  06/21/2013   CLINICAL DATA:  Numbness in the right shoulder and arm.  EXAM: CHEST  2 VIEW  COMPARISON:  08/13/2012  FINDINGS: Continued left ventricular prominence. Atherosclerosis an unfolding of the aorta up. Slightly increased interstitial lung markings. There is a 5 mm nodular shadow projected over the right lower lung not seen on previous films with certainty. This could represent a nipple shadow. Nipple shadow radiograph would be suggested to differentiate this from a true pulmonary nodule. There is no evidence of consolidation or collapse. No effusions. No significant bony finding.  IMPRESSION: Probably no active disease. Left ventricular prominence. Atherosclerotic unfolded aorta up. 5 mm nodular shadow projected over the right lower lung probably representing a nipple shadow. Nipple marker radiographs suggested to differentiate this from a pulmonary nodule.   Electronically Signed   By: Paulina Fusi M.D.   On: 06/21/2013 10:25   Ct Head Wo Contrast  06/21/2013   CLINICAL DATA:  Numbness  EXAM: CT HEAD WITHOUT CONTRAST  TECHNIQUE: Contiguous axial images were obtained from the  base of the skull through the vertex without intravenous contrast.  COMPARISON:  08/06/2012  FINDINGS: No skull fracture is noted. Paranasal sinuses and mastoid air cells are unremarkable. Atherosclerotic calcifications of carotid siphon. Stable cerebral atrophy. No intracranial hemorrhage, mass effect or midline shift. Stable periventricular and patchy subcortical chronic white matter disease. No acute cortical infarction.  No mass lesion is noted on this unenhanced scan.  IMPRESSION: No acute intracranial abnormality. Stable atrophy and chronic white matter disease.   Electronically Signed   By: Natasha Mead M.D.   On: 06/21/2013 10:35   Mr Brain Wo Contrast  06/21/2013   CLINICAL DATA:  Headache. Right arm numbness. No known injury. Renal disorder.  EXAM: MRI HEAD WITHOUT CONTRAST  TECHNIQUE: Multiplanar, multiecho pulse sequences of the brain and surrounding structures were obtained without intravenous contrast.  COMPARISON:  06/21/2013 CT.  08/06/2012 MR.  FINDINGS: Some of the sequences are motion degraded.  Tiny bilateral frontal lobe acute infarcts.  No intracranial hemorrhage.  Moderate small vessel disease type changes.  Global atrophy without hydrocephalus.  No intracranial mass lesion noted on this unenhanced exam.  Major intracranial vascular structures are patent.  Opacification left frontal -ethmoid sinus air cell (cannot rule out mucocele). Mild mucosal thickening remainder of ethmoid sinus air cells.  Transverse ligament hypertrophy. C2-3 bulge with mild spinal stenosis. Cervical medullary junction, pituitary region, pineal region and orbital structures unremarkable.  IMPRESSION: Tiny bilateral frontal lobe infarcts.  Please see above for additional findings.  These results were called by telephone at the time of interpretation on 06/21/2013 at 12:15 PM to Dr. Vanetta Mulders , who verbally acknowledged these results.   Electronically Signed   By: Bridgett Larsson M.D.   On: 06/21/2013 12:16   US Carotid Duplex Bilateral  06/21/2013   EXAM: BILATERAL CAROTID DUPLEX ULTRASOUND  TECHNIQUE: Wallace Cullens scale imaging, color Doppler and duplex ultrasound was performed of bilateral carotid and vertebral arteries in the neck.  COMPARISON:  None.  REVIEW OF SYSTEMS: Quantification of carotid stenosis is based on velocity parameters that correlate the residual internal carotid diameter with NASCET-based stenosis levels, using the  diameter of the distal internal carotid lumen as the denominator for stenosis measurement.  The following velocity measurements were obtained:  PEAK SYSTOLIC/END DIASTOLIC  RIGHT  ICA:                     79/19cm/sec  CCA:                     80/8cm/sec  SYSTOLIC ICA/CCA RATIO:  0.98  DIASTOLIC ICA/CCA RATIO: 2.4  ECA:                     100cm/sec  LEFT  ICA:                     167/44cm/sec  CCA:                     84/12cm/sec  SYSTOLIC ICA/CCA RATIO:  1.99  DIASTOLIC ICA/CCA RATIO: 3.51  ECA:                     113cm/sec  FINDINGS: RIGHT CAROTID ARTERY: Mild plaque through the length of the common carotid artery without high-grade stenosis. The bulb is capacious. There is intimal thickening in the proximal ICA. No evidence of significant residual or recurrent stenosis. Normal waveforms and color Doppler signal.  RIGHT VERTEBRAL  ARTERY:  Normal flow direction and waveform.  LEFT CAROTID ARTERY: Mild plaque in the proximal common carotid artery with diffuse intimal thickening. Partially calcified plaque effaces the carotid bulb and extends into the proximal ICA, resulting in at least mild stenosis. Normal waveforms and color Doppler signal. Mildly elevated peak systolic velocities just beyond the proximal ICA plaque.  LEFT VERTEBRAL ARTERY: Normal flow direction and waveform.  IMPRESSION: 1. Left carotid bifurcation and proximal ICA plaque, resulting in 50-69% diameter stenosis. The exam does not exclude plaque ulceration or embolization. Continued surveillance recommended. 2. No evidence of hemodynamically significant residual or recurrent stenosis post right carotid endarterectomy.   Electronically Signed   By: Oley Balm M.D.   On: 06/21/2013 16:17        Shakenna Herrero A. Gerilyn Pilgrim, M.D.  Diplomate, Biomedical engineer of Psychiatry and Neurology ( Neurology). 06/22/2013, 9:57 AM

## 2013-06-22 NOTE — Evaluation (Signed)
Physical Therapy Evaluation Patient Details Name: Paul Bradshaw MRN: [id number] DOB: [birth year] Today's Date: 06/22/2013 Time: 0950-1004 PT Time Calculation (min): 14 min  PT Assessment / Plan / Recommendation History of Present Illness  Pt is admitted with another episode of right arm and face numbness.  He has had a prior ischemic stroke with no residual deficit and now presents again in the same way.  He states that the sx only last for a short time and he was, in fact, able to take a shower before coming to the hospital.  Clinical Impression   Pt was seen for evaluation and found to have no functional deficits.  He does state that he occasionally "trip over my own feet", but he relates this to hip and knee arthritis.  I do not see any intrinsic balance problems and his gait is too functional for assistive device.    PT Assessment  Patent does not need any further PT services    Follow Up Recommendations  No PT follow up    Does the patient have the potential to tolerate intense rehabilitation      Barriers to Discharge        Equipment Recommendations  None recommended by PT    Recommendations for Other Services     Frequency      Precautions / Restrictions Precautions Precautions: None Restrictions Weight Bearing Restrictions: No   Pertinent Vitals/Pain       Mobility  Bed Mobility Bed Mobility: Supine to Sit Rolling Right: 7: Independent Supine to Sit: 7: Independent Transfers Sit to Stand: 7: Independent Stand to Sit: 7: Independent Ambulation/Gait Ambulation/Gait Assistance: 7: Independent Ambulation Distance (Feet): 150 Feet Assistive device: None Gait Pattern: Within Functional Limits Gait velocity: WNL Stairs: No Modified Rankin (Stroke Patients Only) Pre-Morbid Rankin Score: No symptoms Modified Rankin: No symptoms    Exercises     PT Diagnosis:    PT Problem List:   PT Treatment Interventions:       PT Goals(Current goals can be found  in the care plan section) Acute Rehab PT Goals Patient Stated Goal: to get back home  PT Goal Formulation: No goals set, d/c therapy  Visit Information  Last PT Received On: 06/22/13 History of Present Illness: Pt is admitted with another episode of right arm and face numbness.  He has had a prior ischemic stroke with no residual deficit and now presents again in the same way.  He states that the sx only last for a short time and he was, in fact, able to take a shower before coming to the hospital.       Prior Functioning  Home Living Family/patient expects to be discharged to:: Private residence Living Arrangements: Spouse/significant other Available Help at Discharge: Family;Available 24 hours/day Home Access: Ramped entrance Home Layout: One level Home Equipment: None Prior Function Level of Independence: Independent Communication Communication: No difficulties Dominant Hand: Right    Cognition  Cognition Arousal/Alertness: Awake/alert Behavior During Therapy: WFL for tasks assessed/performed Overall Cognitive Status: Within Functional Limits for tasks assessed    Extremity/Trunk Assessment Upper Extremity Assessment Upper Extremity Assessment: Overall WFL for tasks assessed;Generalized weakness (weakness LUE proximal shoulder 3/5 remaining 4/5) Lower Extremity Assessment Lower Extremity Assessment: Overall WFL for tasks assessed Cervical / Trunk Assessment Cervical / Trunk Assessment: Normal   Balance Balance Balance Assessed: Yes High Level Balance High Level Balance Activites: Side stepping;Backward walking;Direction changes;Turns;Sudden stops;Head turns High Level Balance Comments: no LOB with above activites  End of  Session PT - End of Session Equipment Utilized During Treatment: Gait belt Activity Tolerance: Patient tolerated treatment well Patient left: in bed;with family/visitor present  GP     Myrlene Broker L 06/22/2013, 11:05 AM

## 2013-06-22 NOTE — Progress Notes (Signed)
Patient's wife Kennon Rounds would like to be notified of when the patients TEE is scheduled for this coming Friday. Please contact her at [phone].

## 2013-06-22 NOTE — Progress Notes (Signed)
[facility] PROGRESS NOTE  Paul Bradshaw EXB:[id number] DOB: 1926/04/07 DOA: 06/21/2013 PCP: Josue Hector, MD  Brief narrative 79 year old immediately history of CVA about 9-10 years back, significant right carotid  artery stenosis s/p CEA, BPH presented to the ED with right upper extremity numbness. He also reported having difficulty picking up objects. CT head unremarkable. MRI brain shows tiny bilateral frontal infartcs. Patient now asymptomatic.   Assessment/Plan: Bilateral frontal lobe stroke Given involvement of multiple areas and bilateral, i have spoken with neurology regarding obtaining a TEE. He agrees with getting a cardiology consult for TEE. Patient did have a TEE earlier this year for MSSA bacteremia which was normal.  Cardiology consult placed..   ASA switched to plavix. Patient has 50-69% stenosis of left carotid. Discussed with neurology. May not need intervention although patient did have right sided symptoms. -will follow up with neurology recommendations. May need dual coverage with ASA and plavix for 3 months then plavix alone thereafter. -follow 2D echo  -PT/OT eval -neurochecks. Allow permissive BP. -LDL normal . Continue lipitor. Follow A1C   BPH  continue flomax    Code Status: full code Family Communication: family at bedside Disposition Plan: home after w/up completed. possible TEE on 1/2   Consultants:  neurology  Cardiology    Procedures:  MRI brain, 2D echo  Antibiotics:  none  HPI/Subjective: Patient asymptomatic. No overnight isues  Objective: Filed Vitals:   06/22/13 0637  BP: 170/92  Pulse: 71  Temp: 97.3 F (36.3 C)  Resp: 20    Intake/Output Summary (Last 24 hours) at 06/22/13 1011 Last data filed at 06/22/13 1027  Gross per 24 hour  Intake    240 ml  Output    700 ml  Net   -460 ml   Filed Weights   06/21/13 0922 06/21/13 1434  Weight: 81.647 kg (180 lb) 81.647 kg (180 lb)     Exam:   General:  Elderly male in acute distress  HEENT: no pallor , moist oral  mucosa  Cardiovascular: Normal S1 and S2, no murmurs or gallop  Chest: Clear to auscultation bilaterally, 90 sounds  Abdomen: Soft, nontender, nondistended, bowel sounds present  Extremities: No edema  CNS: AAOX3, nonfocal  Data Reviewed: Basic Metabolic Panel:  Recent Labs Lab 06/21/13 0930 06/22/13 0503  NA 141 141  K 3.6* 3.7  CL 101 102  CO2 31 28  GLUCOSE 98 96  BUN 15 14  CREATININE 1.03 0.92  CALCIUM 9.0 8.7   Liver Function Tests:  Recent Labs Lab 06/21/13 0930  AST 19  ALT 12  ALKPHOS 74  BILITOT 0.6  PROT 6.9  ALBUMIN 3.9   No results found for this basename: LIPASE, AMYLASE,  in the last 168 hours No results found for this basename: AMMONIA,  in the last 168 hours CBC:  Recent Labs Lab 06/21/13 0930 06/22/13 0503  WBC 6.7 5.5  NEUTROABS 4.5  --   HGB 14.1 13.0  HCT 42.2 39.2  MCV 95.9 95.6  PLT 200 179   Cardiac Enzymes:  Recent Labs Lab 06/21/13 1355  TROPONINI <0.30   BNP (last 3 results) No results found for this basename: PROBNP,  in the last 8760 hours CBG: No results found for this basename: GLUCAP,  in the last 168 hours  No results found for this or any previous visit (from the past 240 hour(s)).   Studies: Dg Chest 2 View  06/21/2013   CLINICAL DATA:  Numbness in the right shoulder and  arm.  EXAM: CHEST  2 VIEW  COMPARISON:  08/13/2012  FINDINGS: Continued left ventricular prominence. Atherosclerosis an unfolding of the aorta up. Slightly increased interstitial lung markings. There is a 5 mm nodular shadow projected over the right lower lung not seen on previous films with certainty. This could represent a nipple shadow. Nipple shadow radiograph would be suggested to differentiate this from a true pulmonary nodule. There is no evidence of consolidation or collapse. No effusions. No significant bony finding.  IMPRESSION: Probably no  active disease. Left ventricular prominence. Atherosclerotic unfolded aorta up. 5 mm nodular shadow projected over the right lower lung probably representing a nipple shadow. Nipple marker radiographs suggested to differentiate this from a pulmonary nodule.   Electronically Signed   By: Paulina Fusi M.D.   On: 06/21/2013 10:25   Ct Head Wo Contrast  06/21/2013   CLINICAL DATA:  Numbness  EXAM: CT HEAD WITHOUT CONTRAST  TECHNIQUE: Contiguous axial images were obtained from the base of the skull through the vertex without intravenous contrast.  COMPARISON:  08/06/2012  FINDINGS: No skull fracture is noted. Paranasal sinuses and mastoid air cells are unremarkable. Atherosclerotic calcifications of carotid siphon. Stable cerebral atrophy. No intracranial hemorrhage, mass effect or midline shift. Stable periventricular and patchy subcortical chronic white matter disease. No acute cortical infarction. No mass lesion is noted on this unenhanced scan.  IMPRESSION: No acute intracranial abnormality. Stable atrophy and chronic white matter disease.   Electronically Signed   By: Natasha Mead M.D.   On: 06/21/2013 10:35   Mr Brain Wo Contrast  06/21/2013   CLINICAL DATA:  Headache. Right arm numbness. No known injury. Renal disorder.  EXAM: MRI HEAD WITHOUT CONTRAST  TECHNIQUE: Multiplanar, multiecho pulse sequences of the brain and surrounding structures were obtained without intravenous contrast.  COMPARISON:  06/21/2013 CT.  08/06/2012 MR.  FINDINGS: Some of the sequences are motion degraded.  Tiny bilateral frontal lobe acute infarcts.  No intracranial hemorrhage.  Moderate small vessel disease type changes.  Global atrophy without hydrocephalus.  No intracranial mass lesion noted on this unenhanced exam.  Major intracranial vascular structures are patent.  Opacification left frontal -ethmoid sinus air cell (cannot rule out mucocele). Mild mucosal thickening remainder of ethmoid sinus air cells.  Transverse ligament  hypertrophy. C2-3 bulge with mild spinal stenosis. Cervical medullary junction, pituitary region, pineal region and orbital structures unremarkable.  IMPRESSION: Tiny bilateral frontal lobe infarcts.  Please see above for additional findings.  These results were called by telephone at the time of interpretation on 06/21/2013 at 12:15 PM to Dr. Vanetta Mulders , who verbally acknowledged these results.   Electronically Signed   By: Bridgett Larsson M.D.   On: 06/21/2013 12:16   US Carotid Duplex Bilateral  06/21/2013   EXAM: BILATERAL CAROTID DUPLEX ULTRASOUND  TECHNIQUE: Wallace Cullens scale imaging, color Doppler and duplex ultrasound was performed of bilateral carotid and vertebral arteries in the neck.  COMPARISON:  None.  REVIEW OF SYSTEMS: Quantification of carotid stenosis is based on velocity parameters that correlate the residual internal carotid diameter with NASCET-based stenosis levels, using the diameter of the distal internal carotid lumen as the denominator for stenosis measurement.  The following velocity measurements were obtained:  PEAK SYSTOLIC/END DIASTOLIC  RIGHT  ICA:                     79/19cm/sec  CCA:  80/8cm/sec  SYSTOLIC ICA/CCA RATIO:  0.98  DIASTOLIC ICA/CCA RATIO: 2.4  ECA:                     100cm/sec  LEFT  ICA:                     167/44cm/sec  CCA:                     84/12cm/sec  SYSTOLIC ICA/CCA RATIO:  1.99  DIASTOLIC ICA/CCA RATIO: 3.51  ECA:                     113cm/sec  FINDINGS: RIGHT CAROTID ARTERY: Mild plaque through the length of the common carotid artery without high-grade stenosis. The bulb is capacious. There is intimal thickening in the proximal ICA. No evidence of significant residual or recurrent stenosis. Normal waveforms and color Doppler signal.  RIGHT VERTEBRAL ARTERY:  Normal flow direction and waveform.  LEFT CAROTID ARTERY: Mild plaque in the proximal common carotid artery with diffuse intimal thickening. Partially calcified plaque effaces the  carotid bulb and extends into the proximal ICA, resulting in at least mild stenosis. Normal waveforms and color Doppler signal. Mildly elevated peak systolic velocities just beyond the proximal ICA plaque.  LEFT VERTEBRAL ARTERY: Normal flow direction and waveform.  IMPRESSION: 1. Left carotid bifurcation and proximal ICA plaque, resulting in 50-69% diameter stenosis. The exam does not exclude plaque ulceration or embolization. Continued surveillance recommended. 2. No evidence of hemodynamically significant residual or recurrent stenosis post right carotid endarterectomy.   Electronically Signed   By: Oley Balm M.D.   On: 06/21/2013 16:17    Scheduled Meds: . acetaminophen  650 mg Oral TID  . atorvastatin  10 mg Oral q1800  . clopidogrel  75 mg Oral Q breakfast  . enoxaparin (LOVENOX) injection  40 mg Subcutaneous Q24H  . potassium chloride  20 mEq Oral Daily  . tamsulosin  0.4 mg Oral QPM   Continuous Infusions:     Time spent: 25 minutes    Akirra Lacerda  [facility] Pager [phone] If 7PM-7AM, please contact night-coverage at [email or web address], password [facility] 06/22/2013, 10:11 AM  LOS: 1 day

## 2013-06-22 NOTE — Care Management Note (Signed)
    Page 1 of 1   06/22/2013     11:29:32 AM   CARE MANAGEMENT NOTE 06/22/2013  Patient:  Paul Bradshaw, Paul Bradshaw   Account Number:  [id number]  Date Initiated:  06/22/2013  Documentation initiated by:  Sharrie Rothman  Subjective/Objective Assessment:   Pt admitted from home with CVA. Pt lives with his wife and will return home at discharge. Pt is independent prior to admission. Pt stated that he has access to a walker if needed.     Action/Plan:   No CM needs noted.   Anticipated DC Date:  06/24/2013   Anticipated DC Plan:  HOME/SELF CARE      DC Planning Services  CM consult      Choice offered to / List presented to:             Status of service:  Completed, signed off Medicare Important Message given?   (If response is "NO", the following Medicare IM given date fields will be blank) Date Medicare IM given:   Date Additional Medicare IM given:    Discharge Disposition:  HOME/SELF CARE  Per UR Regulation:    If discussed at Long Length of Stay Meetings, dates discussed:    Comments:  06/22/13 1128 Arlyss Queen, RN BSN CM

## 2013-06-22 NOTE — Consult Note (Signed)
Consulting cardiologist: Dr. Jonelle Sidle  Clinical Summary Paul Bradshaw is an 79 y.o.male with history outlined below, now admitted following recent episode of right upper extremity numbness. Head CT scan demonstrated no acute findings, however brain MRI demonstrates tiny bilateral frontal infarcts, and there is now question regarding potential cardiac source of embolus. He does have a history of known bilateral carotid artery disease and status post prior right carotid endarterectomy.  History was reviewed, including Staphylococcus aureus bacteremia back in February of this year that was treated with antibiotics. At that time TEE was negative for vegetation or thrombus. He was evaluated by ID, and treated with a 4 week course of ceazolin via PICC line.  We are consulted to assist in arranging a repeat TEE. Followup transthoracic study is pending. Patient currently states that his right arm numbness is improved, he has no other specific complaints. Does not endorse any bleeding problems, no swallowing difficulties.  No Known Allergies  Medications Scheduled Medications: . acetaminophen  650 mg Oral TID  . atorvastatin  10 mg Oral q1800  . clopidogrel  75 mg Oral Q breakfast  . enoxaparin (LOVENOX) injection  40 mg Subcutaneous Q24H  . tamsulosin  0.4 mg Oral QPM    Infusions:    PRN Medications: hydrALAZINE, ondansetron (ZOFRAN) IV, oxyCODONE, senna-docusate   Past Medical History  Diagnosis Date  . Stroke 2005 or 2006  . Symptomatic carotid artery stenosis with infarction 2005 or 2006    Status post right carotid endarterectomy  . Arthritis   . Staphylococcus aureus bacteremia 08/09/2012    TEE negative for vegetation or thrombus February 2014  . BPH (benign prostatic hyperplasia)   . Aortic regurgitation     Moderate    Past Surgical History  Procedure Laterality Date  . Carotid endarterectomy    . Kidney stone surgery    . Tee without cardioversion N/A  08/12/2012    Procedure: TRANSESOPHAGEAL ECHOCARDIOGRAM (TEE);  Surgeon: Wendall Stade, MD;  Location: [facility];  Service: Cardiovascular;  Laterality: N/A;  . Back surgery      Family History  Problem Relation Age of Onset  . Emphysema Father   . Alzheimer's disease Mother     Social History Paul Bradshaw reports that he has never smoked. He does not have any smokeless tobacco history on file. Paul Bradshaw reports that he does not drink alcohol.  Review of Systems No palpitations or chest pain. No breathlessness. No recent fevers or chills. No rashes. Otherwise as outlined.  Physical Examination Blood pressure 179/83, pulse 73, temperature 97.6 F (36.4 C), temperature source Oral, resp. rate 20, height 5\' 5"  (1.651 m), weight 180 lb (81.647 kg), SpO2 96.00%.  Intake/Output Summary (Last 24 hours) at 06/22/13 1114 Last data filed at 06/22/13 0900  Gross per 24 hour  Intake    480 ml  Output    700 ml  Net   -220 ml   Patient appears comfortable at rest. HEENT: Conjunctiva and lids normal, oropharynx clear. Neck: Supple, no elevated JVP, soft carotid bruits, no thyromegaly. Lungs: Clear to auscultation, nonlabored breathing at rest. Cardiac: Regular rate and rhythm, no S3, 1/6 diastolic murmur, no pericardial rub. Abdomen: Soft, nontender, bowel sounds present, no guarding or rebound. Extremities: Trace edema, distal pulses 2+. Skin: Warm and dry. Musculoskeletal: No kyphosis. Neuropsychiatric: Alert and oriented x3, affect grossly appropriate.   Lab Results  Basic Metabolic Panel:  Recent Labs Lab 06/21/13 0930 06/22/13 0503  NA 141 141  K 3.6* 3.7  CL 101 102  CO2 31 28  GLUCOSE 98 96  BUN 15 14  CREATININE 1.03 0.92  CALCIUM 9.0 8.7    Liver Function Tests:  Recent Labs Lab 06/21/13 0930  AST 19  ALT 12  ALKPHOS 74  BILITOT 0.6  PROT 6.9  ALBUMIN 3.9    CBC:  Recent Labs Lab 06/21/13 0930 06/22/13 0503  WBC 6.7 5.5  NEUTROABS 4.5  --     HGB 14.1 13.0  HCT 42.2 39.2  MCV 95.9 95.6  PLT 200 179    Cardiac Enzymes:  Recent Labs Lab 06/21/13 1355  TROPONINI <0.30    ECG Normal sinus rhythm with left axis.  Imaging Carotid duplex: IMPRESSION:  1. Left carotid bifurcation and proximal ICA plaque, resulting in  50-69% diameter stenosis. The exam does not exclude plaque  ulceration or embolization. Continued surveillance recommended.  2. No evidence of hemodynamically significant residual or recurrent  stenosis post right carotid endarterectomy.   Impression  1. Request for TEE in the setting of recent presentation with right arm numbness and brain MRI demonstrating tiny bilateral frontal infarcts. Hospitalist team has discussed with Neurology with request for TEE to exclude cardioembolic source. Transthoracic study pending.  2. History of Staphylococcus aureus bacteremia in February, TEE negative for vegetation or thrombus at that time. Patient treated with 4 weeks of cefazolin via PICC line  Per ID. No obvious recurrences.  3. Known bilateral carotid artery disease status post right CEA, recent Dopplers demonstrating 50-69% LICA stenosis. This could still be a possible source for stroke. He has been changed from aspirin to Plavix per primary team.  Recommendations  Will followup on transthoracic echocardiogram and try and arrange a TEE for Friday morning, earliest available time. Discussed with patient and family.  Jonelle Sidle, M.D., F.A.C.C.

## 2013-06-22 NOTE — Progress Notes (Signed)
EEG Completed; Results Pending  

## 2013-06-23 MED ORDER — SODIUM CHLORIDE 0.9 % IV SOLN: INTRAVENOUS | Status: DC

## 2013-06-23 MED ORDER — ASPIRIN EC 81 MG PO TBEC
81.0000 mg | DELAYED_RELEASE_TABLET | Freq: Every day | ORAL | Status: DC
  Administered 2013-06-23 – 2013-06-24 (×2): 81 mg via ORAL
  Filled 2013-06-23 (×2): qty 1

## 2013-06-23 NOTE — Progress Notes (Signed)
[facility] PROGRESS NOTE  Paul Bradshaw YNW:[id number] DOB: 15-Dec-1925 DOA: 06/21/2013 PCP: Josue HectorNYLAND,LEONARD ROBERT, MD  Assessment/Plan: 1. Acute bilateral frontal small stroke. Presented with right-sided weakness. No residual symptoms. Represents a failure of aspirin. Not a candidate for TPA secondary to duration of symptoms on presentation. Dual platelet therapy per neurology. LDL 71. Hemoglobin A1c 5.7. TEE was recommended by neurology. 2. Left carotid stenosis 50-69%. Neurosurgery or interventional therapy recommended per neurology. 3. Staph aureus bacteremia 07/2012 4. S/p right carotid endarterectomy.   TEE 1/2  EEG pending  Anticipate discharge 1/2 when workup complete  Pending studies:   none  Code Status: full code DVT prophylaxis: Lovenox Family Communication: discussed with wife at bedside Disposition Plan: home  Brendia Sacksaniel Goodrich, MD  [facility]  Pager [phone] If 7PM-7AM, please contact night-coverage at [email or web address], password [email or web address] 06/23/2013, 3:21 PM  LOS: 2 days   Summary: 80 year old man with history of stroke carotid artery stenosis, status post carotid endarterectomy presented to the emergency department with complaint of right-sided numbness and shoulder and arm. MRI revealed bilateral strokes.  Consultants:  Neurology  Cardiology   PT: no follow-up  Occupational therapy: No followup.  Procedures:  2-D echocardiogram: Left ventricular ejection fraction 55-60%. Normal wall motion. Grade 1 diastolic dysfunction.  EEG:  Antibiotics:    HPI/Subjective: No complaints. No paresthesias, weakness. No dysphagia or difficulty speaking.   Objective: Filed Vitals:   06/23/13 0202 06/23/13 0531 06/23/13 1030 06/23/13 1454  BP: 173/66 173/82 161/82 108/63  Pulse: 72 73 65 62  Temp: 97.2 F (36.2 C) 97.7 F (36.5 C) 98.1 F (36.7 C) 98.1 F (36.7 C)  TempSrc: Oral Oral Oral Oral  Resp: 20 20 20 20   Height:      Weight:      SpO2:  96% 94% 99% 97%    Intake/Output Summary (Last 24 hours) at 06/23/13 1521 Last data filed at 06/23/13 1252  Gross per 24 hour  Intake    240 ml  Output    300 ml  Net    -60 ml     Filed Weights   06/21/13 0922 06/21/13 1434  Weight: 81.647 kg (180 lb) 81.647 kg (180 lb)    Exam:   Afebrile, vital signs stable.  General: Appears calm and comfortable. Speech fluent and clear.  Cranial nerves 2-12 intact. Speech fluent and clear.  Musculoskeletal: Grossly normal tone and strength all extremities.  Cardiovascular: Regular rate and. No murmur, rub or gallop.  Respiratory: Clear to auscultation bilaterally. No wheezes, rales or rhonchi. Normal respiratory effort.  Data Reviewed:  No labs  Chart reviewed  Scheduled Meds: . acetaminophen  650 mg Oral TID  . atorvastatin  10 mg Oral q1800  . clopidogrel  75 mg Oral Q breakfast  . enoxaparin (LOVENOX) injection  40 mg Subcutaneous Q24H  . tamsulosin  0.4 mg Oral QPM   Continuous Infusions:   Principal Problem:   Acute ischemic stroke Active Problems:   Acute right-sided weakness   Paresthesia of right arm   Elevated blood pressure   Time spent 20 minutes

## 2013-06-24 ENCOUNTER — Encounter (HOSPITAL_COMMUNITY)
Admission: EM | Disposition: A | Discharge status: Home / Self Care | Payer: Self-pay | Source: Home / Self Care | Attending: Family Medicine

## 2013-06-24 ENCOUNTER — Encounter (HOSPITAL_COMMUNITY): Payer: Self-pay | Admitting: *Deleted

## 2013-06-24 ENCOUNTER — Inpatient Hospital Stay (HOSPITAL_COMMUNITY)
Admit: 2013-06-24 | Discharge: 2013-06-24 | Disposition: A | Discharge status: Home / Self Care | Payer: Medicare Other | Attending: Neurology | Admitting: Neurology

## 2013-06-24 DIAGNOSIS — I700 Atherosclerosis of aorta: Secondary | ICD-10-CM

## 2013-06-24 DIAGNOSIS — I639 Cerebral infarction, unspecified: Secondary | ICD-10-CM

## 2013-06-24 HISTORY — PX: TEE WITHOUT CARDIOVERSION: SHX5443

## 2013-06-24 SURGERY — ECHOCARDIOGRAM, TRANSESOPHAGEAL
Anesthesia: Moderate Sedation

## 2013-06-24 MED ORDER — MIDAZOLAM HCL 5 MG/5ML IJ SOLN
INTRAMUSCULAR | Status: AC
  Filled 2013-06-24: qty 10

## 2013-06-24 MED ORDER — MIDAZOLAM HCL 5 MG/5ML IJ SOLN
INTRAMUSCULAR | Status: DC | PRN
  Administered 2013-06-24: 3 mg via INTRAVENOUS
  Administered 2013-06-24: 2 mg via INTRAVENOUS

## 2013-06-24 MED ORDER — FENTANYL CITRATE 0.05 MG/ML IJ SOLN
INTRAMUSCULAR | Status: AC
  Filled 2013-06-24: qty 2

## 2013-06-24 MED ORDER — BUTAMBEN-TETRACAINE-BENZOCAINE 2-2-14 % EX AERO
INHALATION_SPRAY | CUTANEOUS | Status: DC | PRN
  Administered 2013-06-24: 1 via TOPICAL

## 2013-06-24 MED ORDER — SODIUM CHLORIDE BACTERIOSTATIC 0.9 % IJ SOLN
INTRAMUSCULAR | Status: DC | PRN
  Administered 2013-06-24: 10 mL

## 2013-06-24 MED ORDER — SODIUM CHLORIDE BACTERIOSTATIC 0.9 % IJ SOLN
INTRAMUSCULAR | Status: AC
  Filled 2013-06-24: qty 20

## 2013-06-24 MED ORDER — FENTANYL CITRATE 0.05 MG/ML IJ SOLN
INTRAMUSCULAR | Status: DC | PRN
  Administered 2013-06-24: 25 ug via INTRAVENOUS

## 2013-06-24 MED ORDER — CLOPIDOGREL BISULFATE 75 MG PO TABS: 75.0000 mg | ORAL_TABLET | Freq: Every day | ORAL | Status: DC

## 2013-06-24 MED ORDER — ATORVASTATIN CALCIUM 10 MG PO TABS: 10.0000 mg | ORAL_TABLET | Freq: Every day | ORAL | Status: DC

## 2013-06-24 NOTE — Progress Notes (Signed)
Pt was discharged with instructions, prescriptions and care notes.  He verbalized understanding.  Pt left the floor with with staff in stable.

## 2013-06-24 NOTE — Progress Notes (Signed)
*  PRELIMINARY RESULTS* Echocardiogram Echocardiogram Transesophageal has been performed.  Surah Pelley 06/24/2013, 12:29 PM

## 2013-06-24 NOTE — Progress Notes (Signed)
    Consulting cardiologist: Dr. Jonelle SidleSamuel G. Jahne Krukowski  Subjective:    No chest pain or palpitations. No arm weakness or paresthesia.  Objective:   Temp:  [97.6 F (36.4 C)-98.2 F (36.8 C)] 97.8 F (36.6 C) (01/02 0849) Pulse Rate:  [62-96] 82 (01/02 1010) Resp:  [14-20] 16 (01/02 1010) BP: (108-183)/(55-102) 123/102 mmHg (01/02 1005) SpO2:  [94 %-99 %] 99 % (01/02 1010) Last BM Date: 06/23/13  Filed Weights   06/21/13 0922 06/21/13 1434  Weight: 180 lb (81.647 kg) 180 lb (81.647 kg)    Intake/Output Summary (Last 24 hours) at 06/24/13 1028 Last data filed at 06/23/13 1739  Gross per 24 hour  Intake    360 ml  Output    300 ml  Net     60 ml    Exam:  General: No disress.  Lungs: Clear, nonlabored.  Cardiac: RRR, 1/6 systolic murmur.  Extremities: Trace edema.   Lab Results:  Basic Metabolic Panel:  Recent Labs Lab 06/21/13 0930 06/22/13 0503  NA 141 141  K 3.6* 3.7  CL 101 102  CO2 31 28  GLUCOSE 98 96  BUN 15 14  CREATININE 1.03 0.92  CALCIUM 9.0 8.7    Liver Function Tests:  Recent Labs Lab 06/21/13 0930  AST 19  ALT 12  ALKPHOS 74  BILITOT 0.6  PROT 6.9  ALBUMIN 3.9    CBC:  Recent Labs Lab 06/21/13 0930 06/22/13 0503  WBC 6.7 5.5  HGB 14.1 13.0  HCT 42.2 39.2  MCV 95.9 95.6  PLT 200 179    Cardiac Enzymes:  Recent Labs Lab 06/21/13 1355  TROPONINI <0.30     Medications:   Scheduled Medications: . acetaminophen  650 mg Oral TID  . aspirin EC  81 mg Oral Daily  . atorvastatin  10 mg Oral q1800  . clopidogrel  75 mg Oral Q breakfast  . enoxaparin (LOVENOX) injection  40 mg Subcutaneous Q24H  . fentaNYL      . midazolam      . sodium chloride bacteriostatic      . tamsulosin  0.4 mg Oral QPM      PRN Medications:  hydrALAZINE, ondansetron (ZOFRAN) IV, oxyCODONE, senna-docusate   Assessment:   1. Recent presentation with right arm numbness and brain MRI demonstrating tiny bilateral frontal infarcts.  TEE this AM had limited images due to artifact but shows no definite cardiac source of embolus. Now on ASA and Plavix. Discussed with Dr. Irene LimboGoodrich.  2. Known bilateral carotid artery disease status post right CEA, recent Dopplers demonstrating 50-69% LICA stenosis. This could still be a possible source for stroke.   Plan/Discussion:    No further cardiac imaging anticipated at this time. Will sign off.   Jonelle SidleSamuel G. Grey Schlauch, M.D., F.A.C.C.

## 2013-06-24 NOTE — Progress Notes (Signed)
MD states okay to restart heart healthy diet.

## 2013-06-24 NOTE — Progress Notes (Signed)
[facility] PROGRESS NOTE  Paul Bradshaw WUJ:[id number] DOB: 1925-11-16 DOA: 06/21/2013 PCP: Josue Hector, MD  Assessment/Plan: 1. Acute bilateral frontal small stroke. Resolved with no residual symptoms. Presented with right-sided weakness. Not a candidate for TPA secondary to duration of symptoms on presentation. Dual platelet therapy per neurology. LDL 71. Hemoglobin A1c 5.7. TEE with no definite cardiac source of embolus. Discussed with Dr. Diona Browner, recommends continuing ASA and Plavix. 2. Left carotid stenosis 50-69%. No surgery or interventional therapy recommended per neurology. 3. H/o Staph aureus bacteremia 07/2012 4. S/p right carotid endarterectomy.   EEG pending  Home today and ASA, Plavix, Lipitor  Pending studies:   none  Code Status: full code DVT prophylaxis: Lovenox Family Communication: discussed with wife at bedside Disposition Plan: home  Brendia Sacks, MD  [facility]  Pager [phone] If 7PM-7AM, please contact night-coverage at [email or web address], password [facility] 06/24/2013, 2:33 PM  LOS: 3 days   Summary: 80 year old man with history of stroke carotid artery stenosis, status post carotid endarterectomy presented to the emergency department with complaint of right-sided numbness and shoulder and arm. MRI revealed bilateral strokes.  Consultants:  Neurology  Cardiology   PT: no follow-up  Occupational therapy: No followup.  Procedures:  2-D echocardiogram: Left ventricular ejection fraction 55-60%. Normal wall motion. Grade 1 diastolic dysfunction.  EEG:  Transesophageal echocardiogram:LVEF grossly normal. There is no definite aortic valve vegetation. Left atrial appendage views were limited and flow was reduced at around 20 cm/s, but no definite thrombus (and in sinus rhythm). Descending aortic atherosclerosis noted - not mobile. Bubble study negative for PFO. No obvious cardiac source of embolus - full report to  follow.  Antibiotics:  none  HPI/Subjective: Feels great. No complaints. Wants to go home. No neurologic complaints.  Objective: Filed Vitals:   06/24/13 0955 06/24/13 1000 06/24/13 1005 06/24/13 1010  BP: 130/62 139/64 123/102   Pulse: 80 96 91 82  Temp:      TempSrc:      Resp: 15 18 20 16   Height:      Weight:      SpO2: 99% 98% 99% 99%    Intake/Output Summary (Last 24 hours) at 06/24/13 1433 Last data filed at 06/23/13 1739  Gross per 24 hour  Intake    240 ml  Output      0 ml  Net    240 ml     Filed Weights   06/21/13 0922 06/21/13 1434  Weight: 81.647 kg (180 lb) 81.647 kg (180 lb)    Exam:   Afebrile, vital signs stable.  General: Appears calm and comfortable.  Cardiovascular: Regular rate and rhythm. No murmur, rub or gallop.  Respiratory: Clear to auscultation bilaterally. No wheezes, rales or rhonchi. Normal respiratory effort.  Psychiatric: Grossly normal mood and affect. Speech fluent and appropriate.  Musculoskeletal: Grossly normal tone and strength all extremities.  Neurologic: Cranial nerves 2-12 intact. Right pupil slightly eccentric from previous cataract surgery. No pronator drift.  Data Reviewed: TEE as above  Scheduled Meds: . acetaminophen  650 mg Oral TID  . aspirin EC  81 mg Oral Daily  . atorvastatin  10 mg Oral q1800  . clopidogrel  75 mg Oral Q breakfast  . enoxaparin (LOVENOX) injection  40 mg Subcutaneous Q24H  . fentaNYL      . midazolam      . sodium chloride bacteriostatic      . tamsulosin  0.4 mg Oral QPM   Continuous Infusions:   Principal Problem:  Acute ischemic stroke Active Problems:   Acute right-sided weakness   Paresthesia of right arm   Elevated blood pressure   Time spent 20 minutes

## 2013-06-24 NOTE — CV Procedure (Signed)
TEE without obvious complications. Images are limited by air artifact in esophagus. LVEF grossly normal. There is no definite aortic valve vegetation. Left atrial appendage views were limited and flow was reduced at around 20 cm/s, but no definite thrombus (and in sinus rhythm). Descending aortic atherosclerosis noted - not mobile. Bubble study negative for PFO. No obvious cardiac source of embolus - full report to follow.  Jonelle SidleSamuel G. McDowell, M.D., F.A.C.C.

## 2013-06-24 NOTE — Discharge Summary (Signed)
Physician Discharge Summary  Paul Bradshaw ZOX:[id number] DOB: [birth year] DOA: 06/21/2013  PCP: Josue Hector, MD  Admit date: 06/21/2013 Discharge date: 06/24/2013  Recommendations for Outpatient Follow-up:  1. Followup acute bilateral small strokes. Started on statin therapy and Plavix added to aspirin. 2. Left carotid stenosis 50-69%. No surgery or interventional therapy recommended per neurology at this time. 3. Blood pressure stable without therapy. Continue to monitor blood pressure. 4. Continued risk factor modification 5. EEG report pending, no signs of seizure.  Follow-up Information   Follow up with Josue Hector, MD. Schedule an appointment as soon as possible for a visit in 1 week.   Specialty:  Family Medicine   Contact information:   [address] [place] [address] [phone]       Follow up with [facility], KOFI, MD. Schedule an appointment as soon as possible for a visit in 1 month.   Specialty:  Neurology   Contact information:   [address] [place] [address] [phone]      Discharge Diagnoses:  1. Acute bilateral small strokes 2. Left carotid stenosis 50-69%  Discharge Condition: Improved Disposition: Home  Diet recommendation: Heart healthy  Filed Weights   06/21/13 0922 06/21/13 1434  Weight: 81.647 kg (180 lb) 81.647 kg (180 lb)    History of present illness:  80 year old man with history of stroke carotid artery stenosis, status post carotid endarterectomy presented to the emergency department with complaint of right-sided numbness and shoulder and arm. MRI revealed bilateral strokes.  Hospital Course:  Mr. Herberger was admitted for further evaluation of stroke. His symptoms completely resolved by the time of admission. He had no recurrent neurologic symptoms. Stroke evaluation as detailed below but was unrevealing. Discussed with cardiology and patient seen by neurology. Recommendations to continue aspirin and add  Plavix therapy. No anticoagulation at this point. Individual issues as below.  1. Acute bilateral frontal small stroke. Resolved with no residual symptoms. Presented with right-sided weakness. Not a candidate for TPA secondary to duration of symptoms on presentation. Dual platelet therapy per neurology. LDL 71. Hemoglobin A1c 5.7. TEE with no definite cardiac source of embolus. Discussed with Dr. Diona Browner, recommends continuing ASA and Plavix. 2. Left carotid stenosis 50-69%. No surgery or interventional therapy recommended per neurology. 3. H/o Staph aureus bacteremia 07/2012 4. S/p right carotid endarterectomy.  Consultants:  Neurology  Cardiology  PT: no follow-up  Occupational therapy: No followup. Procedures:  2-D echocardiogram: Left ventricular ejection fraction 55-60%. Normal wall motion. Grade 1 diastolic dysfunction.  EEG: report pending Transesophageal echocardiogram:LVEF grossly normal. There is no definite aortic valve vegetation. Left atrial appendage views were limited and flow was reduced at around 20 cm/s, but no definite thrombus (and in sinus rhythm). Descending aortic atherosclerosis noted - not mobile. Bubble study negative for PFO. No obvious cardiac source of embolus - full report to follow. Antibiotics:  none  Discharge Instructions  Discharge Orders   Future Orders Complete By Expires   Activity as tolerated - No restrictions  As directed    Diet - low sodium heart healthy  As directed    Discharge instructions  As directed    Comments:     Call physician or seek immediate medical attention for weakness, numbness, difficulty speaking or worsening of condition.       Medication List         APAP 325 MG tablet  Take 650 mg by mouth 2 (two) times daily as needed for pain.  aspirin 325 MG tablet  Take 325 mg by mouth daily.     atorvastatin 10 MG tablet  Commonly known as:  LIPITOR  Take 1 tablet (10 mg total) by mouth daily at 6 PM.     clopidogrel  75 MG tablet  Commonly known as:  PLAVIX  Take 1 tablet (75 mg total) by mouth daily with breakfast.     tamsulosin 0.4 MG Caps capsule  Commonly known as:  FLOMAX  Take 0.4 mg by mouth every evening.       No Known Allergies  The results of significant diagnostics from this hospitalization (including imaging, microbiology, ancillary and laboratory) are listed below for reference.    Significant Diagnostic Studies: Dg Chest 2 View  06/21/2013   CLINICAL DATA:  Numbness in the right shoulder and arm.  EXAM: CHEST  2 VIEW  COMPARISON:  08/13/2012  FINDINGS: Continued left ventricular prominence. Atherosclerosis an unfolding of the aorta up. Slightly increased interstitial lung markings. There is a 5 mm nodular shadow projected over the right lower lung not seen on previous films with certainty. This could represent a nipple shadow. Nipple shadow radiograph would be suggested to differentiate this from a true pulmonary nodule. There is no evidence of consolidation or collapse. No effusions. No significant bony finding.  IMPRESSION: Probably no active disease. Left ventricular prominence. Atherosclerotic unfolded aorta up. 5 mm nodular shadow projected over the right lower lung probably representing a nipple shadow. Nipple marker radiographs suggested to differentiate this from a pulmonary nodule.   Electronically Signed   By: Paulina FusiMark  Shogry M.D.   On: 06/21/2013 10:25   Ct Head Wo Contrast  06/21/2013   CLINICAL DATA:  Numbness  EXAM: CT HEAD WITHOUT CONTRAST  TECHNIQUE: Contiguous axial images were obtained from the base of the skull through the vertex without intravenous contrast.  COMPARISON:  08/06/2012  FINDINGS: No skull fracture is noted. Paranasal sinuses and mastoid air cells are unremarkable. Atherosclerotic calcifications of carotid siphon. Stable cerebral atrophy. No intracranial hemorrhage, mass effect or midline shift. Stable periventricular and patchy subcortical chronic white matter  disease. No acute cortical infarction. No mass lesion is noted on this unenhanced scan.  IMPRESSION: No acute intracranial abnormality. Stable atrophy and chronic white matter disease.   Electronically Signed   By: Natasha MeadLiviu  Pop M.D.   On: 06/21/2013 10:35   Mr Brain Wo Contrast  06/21/2013   CLINICAL DATA:  Headache. Right arm numbness. No known injury. Renal disorder.  EXAM: MRI HEAD WITHOUT CONTRAST  TECHNIQUE: Multiplanar, multiecho pulse sequences of the brain and surrounding structures were obtained without intravenous contrast.  COMPARISON:  06/21/2013 CT.  08/06/2012 MR.  FINDINGS: Some of the sequences are motion degraded.  Tiny bilateral frontal lobe acute infarcts.  No intracranial hemorrhage.  Moderate small vessel disease type changes.  Global atrophy without hydrocephalus.  No intracranial mass lesion noted on this unenhanced exam.  Major intracranial vascular structures are patent.  Opacification left frontal -ethmoid sinus air cell (cannot rule out mucocele). Mild mucosal thickening remainder of ethmoid sinus air cells.  Transverse ligament hypertrophy. C2-3 bulge with mild spinal stenosis. Cervical medullary junction, pituitary region, pineal region and orbital structures unremarkable.  IMPRESSION: Tiny bilateral frontal lobe infarcts.  Please see above for additional findings.  These results were called by telephone at the time of interpretation on 06/21/2013 at 12:15 PM to Dr. Vanetta MuldersSCOTT ZACKOWSKI , who verbally acknowledged these results.   Electronically Signed   By: Kandice HamsSteve  Olson M.D.  On: 06/21/2013 12:16   US Carotid Duplex Bilateral  06/21/2013   EXAM: BILATERAL CAROTID DUPLEX ULTRASOUND  TECHNIQUE: Wallace Cullens scale imaging, color Doppler and duplex ultrasound was performed of bilateral carotid and vertebral arteries in the neck.  COMPARISON:  None.  REVIEW OF SYSTEMS: Quantification of carotid stenosis is based on velocity parameters that correlate the residual internal carotid diameter with  NASCET-based stenosis levels, using the diameter of the distal internal carotid lumen as the denominator for stenosis measurement.  The following velocity measurements were obtained:  PEAK SYSTOLIC/END DIASTOLIC  RIGHT  ICA:                     79/19cm/sec  CCA:                     80/8cm/sec  SYSTOLIC ICA/CCA RATIO:  0.98  DIASTOLIC ICA/CCA RATIO: 2.4  ECA:                     100cm/sec  LEFT  ICA:                     167/44cm/sec  CCA:                     84/12cm/sec  SYSTOLIC ICA/CCA RATIO:  1.99  DIASTOLIC ICA/CCA RATIO: 3.51  ECA:                     113cm/sec  FINDINGS: RIGHT CAROTID ARTERY: Mild plaque through the length of the common carotid artery without high-grade stenosis. The bulb is capacious. There is intimal thickening in the proximal ICA. No evidence of significant residual or recurrent stenosis. Normal waveforms and color Doppler signal.  RIGHT VERTEBRAL ARTERY:  Normal flow direction and waveform.  LEFT CAROTID ARTERY: Mild plaque in the proximal common carotid artery with diffuse intimal thickening. Partially calcified plaque effaces the carotid bulb and extends into the proximal ICA, resulting in at least mild stenosis. Normal waveforms and color Doppler signal. Mildly elevated peak systolic velocities just beyond the proximal ICA plaque.  LEFT VERTEBRAL ARTERY: Normal flow direction and waveform.  IMPRESSION: 1. Left carotid bifurcation and proximal ICA plaque, resulting in 50-69% diameter stenosis. The exam does not exclude plaque ulceration or embolization. Continued surveillance recommended. 2. No evidence of hemodynamically significant residual or recurrent stenosis post right carotid endarterectomy.   Electronically Signed   By: Oley Balm M.D.   On: 06/21/2013 16:17   Dg Chest Special View  06/22/2013   CLINICAL DATA:  Nodule right lung base.  Evaluate for nipple shadow.  EXAM: CHEST SPECIAL VIEW  COMPARISON:  Chest x-ray 06/21/2013.  FINDINGS: Mediastinum and hilar structures  are normal. Lungs are clear of acute infiltrates. Mild basilar atelectasis. Previously identified nodular density right lung base represents a nipple shadow as evidenced by its appearance with nipple markers. Heart size normal. No acute osseous abnormality.  IMPRESSION: Previously identified tiny nodular opacity right lung base represents a nipple shadow.   Electronically Signed   By: Maisie Fus  Register   On: 06/22/2013 14:35    Labs: Basic Metabolic Panel:  Recent Labs Lab 06/21/13 0930 06/22/13 0503  NA 141 141  K 3.6* 3.7  CL 101 102  CO2 31 28  GLUCOSE 98 96  BUN 15 14  CREATININE 1.03 0.92  CALCIUM 9.0 8.7   Liver Function Tests:  Recent Labs Lab 06/21/13 0930  AST 19  ALT 12  ALKPHOS 74  BILITOT 0.6  PROT 6.9  ALBUMIN 3.9   CBC:  Recent Labs Lab 06/21/13 0930 06/22/13 0503  WBC 6.7 5.5  NEUTROABS 4.5  --   HGB 14.1 13.0  HCT 42.2 39.2  MCV 95.9 95.6  PLT 200 179   Cardiac Enzymes:  Recent Labs Lab 06/21/13 1355  TROPONINI <0.30    Principal Problem:   Acute ischemic stroke Active Problems:   Acute right-sided weakness   Paresthesia of right arm   Elevated blood pressure   Time coordinating discharge: 25 minutes  Signed:  Brendia Sacks, MD [facility] 06/24/2013, 3:07 PM

## 2013-06-27 ENCOUNTER — Encounter (HOSPITAL_COMMUNITY): Payer: Self-pay | Admitting: Cardiology

## 2013-06-29 NOTE — Procedures (Signed)
[facility] [place] A. Gerilyn Pilgrimoonquah, MD     [email or web address]        NAMCarollee Bradshaw:  Paul Bradshaw, Paul Bradshaw                 ACCOUNT NO.:  [id number]  MEDICAL RECORD NO.:  [id number]  LOCATION:  EE                           FACILITY:  [facility]  PHYSICIAN:  Mcclain Shall A. Gerilyn Pilgrimoonquah, M.D. DATE OF BIRTH:  02/12/1926  DATE OF PROCEDURE:  06/22/2013 DATE OF DISCHARGE:  06/22/2013                             EEG INTERPRETATION   INDICATION:  An 80 year old man who presents with a focal neurological deficit, especially with numbness and weakness.  There is also some stiffening worse from seizures.  MEDICATIONS:  Acetaminophen, Lipitor, clopidogrel, Lovenox, hydralazine, oxycodone, Flomax, Senokot, Zofran.  ANALYSIS:  A 16-channel recording using standard 10/20 measurements is conducted for 21 minutes.  There is a well-formed posterior dominant rhythm of 8.5 Hz which attenuates with eye opening.  There is beta activity observed in frontal areas.  Awake and sleep activities are recorded.  K complexes and sleep spindles are observed.  There is no focal or lateralized slowing.  There is a single sharp wave activity that phase reverses at T3.  No electrographic seizures are observed, however.  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     Kaimen Peine A. Gerilyn Pilgrimoonquah, M.D.     KAD/MEDQ  D:  06/29/2013  T:  06/29/2013  Job:  [id number]

## 2013-07-01 NOTE — Progress Notes (Signed)
EEG read as "Mildly abnormal recording showing a single left temporal epileptiform discharge."  Discussed with Dr. Gerilyn Pilgrimoonquah (interpreting MD). He recommends observation, no AED, and keep follow-up already scheduled with him.  Spoke with patient. He is doing well. No syncope or "spells" of any kind.  Paul Sacksaniel Jessamine Barcia, MD [facility]

## 2013-10-14 DIAGNOSIS — N400 Enlarged prostate without lower urinary tract symptoms: Secondary | ICD-10-CM | POA: Insufficient documentation

## 2013-10-14 DIAGNOSIS — E782 Mixed hyperlipidemia: Secondary | ICD-10-CM | POA: Insufficient documentation

## 2013-11-07 ENCOUNTER — Encounter (HOSPITAL_COMMUNITY): Payer: Self-pay | Admitting: Emergency Medicine

## 2013-11-07 ENCOUNTER — Emergency Department (HOSPITAL_COMMUNITY)
Admission: EM | Admit: 2013-11-07 | Discharge: 2013-11-07 | Disposition: A | Discharge status: Home / Self Care | Payer: Medicare Other | Attending: Emergency Medicine | Admitting: Emergency Medicine

## 2013-11-07 DIAGNOSIS — Z8679 Personal history of other diseases of the circulatory system: Secondary | ICD-10-CM | POA: Insufficient documentation

## 2013-11-07 DIAGNOSIS — Z7982 Long term (current) use of aspirin: Secondary | ICD-10-CM | POA: Insufficient documentation

## 2013-11-07 DIAGNOSIS — M129 Arthropathy, unspecified: Secondary | ICD-10-CM | POA: Insufficient documentation

## 2013-11-07 DIAGNOSIS — IMO0002 Reserved for concepts with insufficient information to code with codable children: Secondary | ICD-10-CM | POA: Insufficient documentation

## 2013-11-07 DIAGNOSIS — M5430 Sciatica, unspecified side: Secondary | ICD-10-CM | POA: Insufficient documentation

## 2013-11-07 DIAGNOSIS — Z791 Long term (current) use of non-steroidal anti-inflammatories (NSAID): Secondary | ICD-10-CM | POA: Insufficient documentation

## 2013-11-07 DIAGNOSIS — Z8619 Personal history of other infectious and parasitic diseases: Secondary | ICD-10-CM | POA: Insufficient documentation

## 2013-11-07 DIAGNOSIS — N400 Enlarged prostate without lower urinary tract symptoms: Secondary | ICD-10-CM | POA: Insufficient documentation

## 2013-11-07 DIAGNOSIS — M25559 Pain in unspecified hip: Secondary | ICD-10-CM | POA: Insufficient documentation

## 2013-11-07 DIAGNOSIS — Z8673 Personal history of transient ischemic attack (TIA), and cerebral infarction without residual deficits: Secondary | ICD-10-CM | POA: Insufficient documentation

## 2013-11-07 DIAGNOSIS — Z7902 Long term (current) use of antithrombotics/antiplatelets: Secondary | ICD-10-CM | POA: Insufficient documentation

## 2013-11-07 MED ORDER — HYDROCODONE-ACETAMINOPHEN 5-325 MG PO TABS
1.0000 | ORAL_TABLET | Freq: Once | ORAL | Status: AC
Start: 2013-11-07 — End: 2013-11-07
  Administered 2013-11-07: 1 via ORAL
  Filled 2013-11-07: qty 1

## 2013-11-07 MED ORDER — METHYLPREDNISOLONE 4 MG PO KIT: PACK | ORAL | Status: DC

## 2013-11-07 MED ORDER — DEXAMETHASONE SODIUM PHOSPHATE 4 MG/ML IJ SOLN: 10.0000 mg | Freq: Once | INTRAMUSCULAR | Status: DC

## 2013-11-07 MED ORDER — DEXAMETHASONE SODIUM PHOSPHATE 10 MG/ML IJ SOLN
10.0000 mg | Freq: Once | INTRAMUSCULAR | Status: DC
  Administered 2013-11-07: 10 mg via INTRAVENOUS
  Filled 2013-11-07: qty 1

## 2013-11-07 MED ORDER — HYDROCODONE-ACETAMINOPHEN 5-325 MG PO TABS: 2.0000 | ORAL_TABLET | ORAL | Status: DC | PRN

## 2013-11-07 NOTE — ED Notes (Signed)
PT c/o lower back pain that radiates down his left leg. PT was treated with a prednisone pack a few weeks ago from primary doctor. PT c/o worsening in pain since last night.

## 2013-11-07 NOTE — ED Provider Notes (Addendum)
CSN: [id number]     Arrival date & time 11/07/13  1006 History   First MD Initiated Contact with Patient 11/07/13 1248     Chief Complaint  Patient presents with  . Leg Pain      HPI  Patient presents with back pain. He is having some back pain for the last several weeks. Seen by his physician Dr. Lysbeth GalasNyland.  Was doing better after a course of prednisone. However, on Friday, and Saturday he used a Rototiller in his yard. This was 2-3 days ago. He has had increasing pain in his buttock and into his posterior aspect of his left thigh. He's not had any frank back pain at the midline. No right-sided symptoms. No numbness or weakness. No incontinence or foot drop. History of herniated disc treated conservatively over 20 years ago.  Past Medical History  Diagnosis Date  . Stroke 2005 or 2006  . Symptomatic carotid artery stenosis with infarction 2005 or 2006    Status post right carotid endarterectomy  . Arthritis   . Staphylococcus aureus bacteremia 08/09/2012    TEE negative for vegetation or thrombus February 2014  . BPH (benign prostatic hyperplasia)   . Aortic regurgitation     Moderate   Past Surgical History  Procedure Laterality Date  . Carotid endarterectomy    . Kidney stone surgery    . Tee without cardioversion N/A 08/12/2012    Procedure: TRANSESOPHAGEAL ECHOCARDIOGRAM (TEE);  Surgeon: Wendall StadePeter C Nishan, MD;  Location: [facility];  Service: Cardiovascular;  Laterality: N/A;  . Back surgery    . Tee without cardioversion N/A 06/24/2013    Procedure: TRANSESOPHAGEAL ECHOCARDIOGRAM (TEE);  Surgeon: Jonelle SidleSamuel G McDowell, MD;  Location: [facility];  Service: Endoscopy;  Laterality: N/A;   Family History  Problem Relation Age of Onset  . Emphysema Father   . Alzheimer's disease Mother    History  Substance Use Topics  . Smoking status: Never Smoker   . Smokeless tobacco: Not on file  . Alcohol Use: No    Review of Systems  Constitutional: Negative for fever, chills,  diaphoresis, appetite change and fatigue.  HENT: Negative for mouth sores, sore throat and trouble swallowing.   Eyes: Negative for visual disturbance.  Respiratory: Negative for cough, chest tightness, shortness of breath and wheezing.   Cardiovascular: Negative for chest pain.  Gastrointestinal: Negative for nausea, vomiting, abdominal pain, diarrhea and abdominal distention.  Endocrine: Negative for polydipsia, polyphagia and polyuria.  Genitourinary: Negative for dysuria, frequency and hematuria.  Musculoskeletal: Negative for gait problem.       Left leg pain, posterior hip pain.  Skin: Negative for color change, pallor and rash.  Neurological: Negative for dizziness, syncope, light-headedness and headaches.  Hematological: Does not bruise/bleed easily.  Psychiatric/Behavioral: Negative for behavioral problems and confusion.      Allergies  Review of patient's allergies indicates no known allergies.  Home Medications   Prior to Admission medications   Medication Sig Start Date End Date Taking? Authorizing Provider  atorvastatin (LIPITOR) 10 MG tablet Take 1 tablet (10 mg total) by mouth daily at 6 PM. 06/24/13  Yes Standley Brookinganiel P Goodrich, MD  clopidogrel (PLAVIX) 75 MG tablet Take 1 tablet (75 mg total) by mouth daily with breakfast. 06/24/13  Yes Standley Brookinganiel P Goodrich, MD  Tamsulosin HCl (FLOMAX) 0.4 MG CAPS Take 0.4 mg by mouth every evening.   Yes Historical Provider, MD  Acetaminophen (APAP) 325 MG tablet Take 650 mg by mouth 2 (two) times daily  as needed for pain. 08/14/12   Christiane Haorinna L Sullivan, MD  aspirin 325 MG tablet Take 325 mg by mouth daily.    Historical Provider, MD  hydrochlorothiazide (HYDRODIURIL) 25 MG tablet Take 1 tablet by mouth daily as needed. 08/29/13   Historical Provider, MD  HYDROcodone-acetaminophen (NORCO/VICODIN) 5-325 MG per tablet Take 2 tablets by mouth every 4 (four) hours as needed. 11/07/13   Rolland PorterMark Arrin Pintor, MD  ketorolac (ACULAR) 0.5 % ophthalmic solution Place 1  drop into both eyes daily. 10/14/13   Historical Provider, MD  latanoprost (XALATAN) 0.005 % ophthalmic solution Place 1 drop into the right eye 4 (four) times daily. 10/14/13   Historical Provider, MD  methylPREDNISolone (MEDROL DOSEPAK) 4 MG tablet 6 on day 1, 5 on day 2, etc. 11/07/13   Rolland PorterMark Graig Hessling, MD   BP 183/82  Pulse 68  Temp(Src) 97.9 F (36.6 C)  Resp 20  Ht 5\' 4"  (1.626 m)  Wt 175 lb (79.379 kg)  BMI 30.02 kg/m2  SpO2 98% Physical Exam  Constitutional: He is oriented to person, place, and time. He appears well-developed and well-nourished. No distress.  HENT:  Head: Normocephalic.  Eyes: Conjunctivae are normal. Pupils are equal, round, and reactive to light. No scleral icterus.  Neck: Normal range of motion. Neck supple. No thyromegaly present.  Cardiovascular: Normal rate and regular rhythm.  Exam reveals no gallop and no friction rub.   No murmur heard. Pulmonary/Chest: Effort normal and breath sounds normal. No respiratory distress. He has no wheezes. He has no rales.  Abdominal: Soft. Bowel sounds are normal. He exhibits no distension. There is no tenderness. There is no rebound.  Musculoskeletal: Normal range of motion.       Back:       Legs: Neurological: He is alert and oriented to person, place, and time.   Normal UE strength and sensation.  Norma symmetric strength to flex/.extend hip and knees, dorsi/plantar flex ankles. Normal symmetric sensation to all distributions to LEs Patellar and achilles reflexes 1-2+. Downgoing Babinski   Skin: Skin is warm and dry. No rash noted.  Psychiatric: He has a normal mood and affect. His behavior is normal.    ED Course  Procedures (including critical care time) Labs Review Labs Reviewed - No data to display  Imaging Review No results found.   EKG Interpretation None      MDM   Final diagnoses:  Sciatica    His exam is suggestive of sciatica. It is in a sciatic pattern. No radicular pattern. No loss of  strength. No tingling numbness paresthesias. No change in his bowel or bladder. Is very likely sciatica.  Educated regarding signs of herniated nucleus including foot drop, numbness, right-sided symptoms. Rest and recheck here if any of these changes. See Dr. Denzil MagnusonNylan this week if not improving.    Rolland PorterMark Darrol Brandenburg, MD 11/07/13 1309  Rolland PorterMark Zebulon Gantt, MD 11/07/13 [phone]

## 2013-11-07 NOTE — ED Notes (Signed)
Complain of low back pain and pain down his left leg

## 2013-11-07 NOTE — Discharge Instructions (Signed)
Avoid physical activity for at lease 2-3 days. Return to er with leg weakness, incontinence of bowel or bladder, or any right leg weakness.  Sciatica Sciatica is pain, weakness, numbness, or tingling along the path of the sciatic nerve. The nerve starts in the lower back and runs down the back of each leg. The nerve controls the muscles in the lower leg and in the back of the knee, while also providing sensation to the back of the thigh, lower leg, and the sole of your foot. Sciatica is a symptom of another medical condition. For instance, nerve damage or certain conditions, such as a herniated disk or bone spur on the spine, pinch or put pressure on the sciatic nerve. This causes the pain, weakness, or other sensations normally associated with sciatica. Generally, sciatica only affects one side of the body. CAUSES   Herniated or slipped disc.  Degenerative disk disease.  A pain disorder involving the narrow muscle in the buttocks (piriformis syndrome).  Pelvic injury or fracture.  Pregnancy.  Tumor (rare). SYMPTOMS  Symptoms can vary from mild to very severe. The symptoms usually travel from the low back to the buttocks and down the back of the leg. Symptoms can include:  Mild tingling or dull aches in the lower back, leg, or hip.  Numbness in the back of the calf or sole of the foot.  Burning sensations in the lower back, leg, or hip.  Sharp pains in the lower back, leg, or hip.  Leg weakness.  Severe back pain inhibiting movement. These symptoms may get worse with coughing, sneezing, laughing, or prolonged sitting or standing. Also, being overweight may worsen symptoms. DIAGNOSIS  Your caregiver will perform a physical exam to look for common symptoms of sciatica. He or she may ask you to do certain movements or activities that would trigger sciatic nerve pain. Other tests may be performed to find the cause of the sciatica. These may include:  Blood tests.  X-rays.  Imaging  tests, such as an MRI or CT scan. TREATMENT  Treatment is directed at the cause of the sciatic pain. Sometimes, treatment is not necessary and the pain and discomfort goes away on its own. If treatment is needed, your caregiver may suggest:  Over-the-counter medicines to relieve pain.  Prescription medicines, such as anti-inflammatory medicine, muscle relaxants, or narcotics.  Applying heat or ice to the painful area.  Steroid injections to lessen pain, irritation, and inflammation around the nerve.  Reducing activity during periods of pain.  Exercising and stretching to strengthen your abdomen and improve flexibility of your spine. Your caregiver may suggest losing weight if the extra weight makes the back pain worse.  Physical therapy.  Surgery to eliminate what is pressing or pinching the nerve, such as a bone spur or part of a herniated disk. HOME CARE INSTRUCTIONS   Only take over-the-counter or prescription medicines for pain or discomfort as directed by your caregiver.  Apply ice to the affected area for 20 minutes, 3 4 times a day for the first 48 72 hours. Then try heat in the same way.  Exercise, stretch, or perform your usual activities if these do not aggravate your pain.  Attend physical therapy sessions as directed by your caregiver.  Keep all follow-up appointments as directed by your caregiver.  Do not wear high heels or shoes that do not provide proper support.  Check your mattress to see if it is too soft. A firm mattress may lessen your pain and discomfort.  SEEK IMMEDIATE MEDICAL CARE IF:   You lose control of your bowel or bladder (incontinence).  You have increasing weakness in the lower back, pelvis, buttocks, or legs.  You have redness or swelling of your back.  You have a burning sensation when you urinate.  You have pain that gets worse when you lie down or awakens you at night.  Your pain is worse than you have experienced in the past.  Your  pain is lasting longer than 4 weeks.  You are suddenly losing weight without reason. MAKE SURE YOU:  Understand these instructions.  Will watch your condition.  Will get help right away if you are not doing well or get worse. Document Released: 06/03/2001 Document Revised: 12/09/2011 Document Reviewed: 10/19/2011 [facility] Patient Information 2014 [place] [place].

## 2013-11-29 ENCOUNTER — Other Ambulatory Visit (HOSPITAL_COMMUNITY): Payer: Self-pay | Admitting: Family Medicine

## 2013-11-29 DIAGNOSIS — M545 Low back pain, unspecified: Secondary | ICD-10-CM

## 2013-12-02 ENCOUNTER — Ambulatory Visit (HOSPITAL_COMMUNITY)
Admission: RE | Admit: 2013-12-02 | Discharge: 2013-12-02 | Disposition: A | Discharge status: Home / Self Care | Payer: Medicare Other | Source: Ambulatory Visit | Attending: Family Medicine | Admitting: Family Medicine

## 2013-12-02 DIAGNOSIS — M545 Low back pain, unspecified: Secondary | ICD-10-CM | POA: Insufficient documentation

## 2013-12-02 DIAGNOSIS — M5137 Other intervertebral disc degeneration, lumbosacral region: Secondary | ICD-10-CM | POA: Insufficient documentation

## 2013-12-02 DIAGNOSIS — M5126 Other intervertebral disc displacement, lumbar region: Secondary | ICD-10-CM | POA: Insufficient documentation

## 2013-12-02 DIAGNOSIS — M51379 Other intervertebral disc degeneration, lumbosacral region without mention of lumbar back pain or lower extremity pain: Secondary | ICD-10-CM | POA: Insufficient documentation

## 2013-12-02 DIAGNOSIS — M48061 Spinal stenosis, lumbar region without neurogenic claudication: Secondary | ICD-10-CM | POA: Insufficient documentation

## 2014-01-10 ENCOUNTER — Other Ambulatory Visit: Payer: Self-pay | Admitting: Neurosurgery

## 2014-01-12 ENCOUNTER — Encounter (HOSPITAL_COMMUNITY): Payer: Self-pay | Admitting: Pharmacy Technician

## 2014-01-17 ENCOUNTER — Other Ambulatory Visit: Payer: Self-pay | Admitting: Neurosurgery

## 2014-01-17 NOTE — Pre-Procedure Instructions (Signed)
Paul Bradshaw  01/17/2014   Your procedure is scheduled on:  Mon, Aug 3 @ 10:45 AM  [facility].  Call this number if you have problems the morning of surgery: [phone]   Remember:   Do not eat food or drink liquids after midnight.   Take these medicines the morning of surgery with A SIP OF WATER: Eye Drops and Pain Pill(if needed)               Stop taking your Plavix and Aspirin a week before surgery as instructed by your medical md. No Goody's,BC's,Aleve,Ibuprofen,Fish Oil,or any Herbal Medications   Do not wear jewelry  Do not wear lotions, powders, or colognes. You may wear deodorant.  Men may shave face and neck.  Do not bring valuables to the hospital.  [facility]                  for any belongings or valuables.               Contacts, dentures or bridgework may not be worn into surgery.  Leave suitcase in the car. After surgery it may be brought to your room.  For patients admitted to the hospital, discharge time is determined by your                treatment team.               Patients discharged the day of surgery will not be allowed to drive  home.    Special Instructions:  [facility] - Preparing for Surgery  Before surgery, you can play an important role.  Because skin is not sterile, your skin needs to be as free of germs as possible.  You can reduce the number of germs on you skin by washing with CHG (chlorahexidine gluconate) soap before surgery.  CHG is an antiseptic cleaner which kills germs and bonds with the skin to continue killing germs even after washing.  Please DO NOT use if you have an allergy to CHG or antibacterial soaps.  If your skin becomes reddened/irritated stop using the CHG and inform your nurse when you arrive at Short Stay.  Do not shave (including legs and underarms) for at least 48 hours prior to the first CHG shower.  You may shave your face.  Please follow these instructions  carefully:   1.  Shower with CHG Soap the night before surgery and the                                morning of Surgery.  2.  If you choose to wash your hair, wash your hair first as usual with your       normal shampoo.  3.  After you shampoo, rinse your hair and body thoroughly to remove the                      Shampoo.  4.  Use CHG as you would any other liquid soap.  You can apply chg directly       to the skin and wash gently with scrungie or a clean washcloth.  5.  Apply the CHG Soap to your body ONLY FROM THE NECK DOWN.        Do not use on open wounds or open sores.  Avoid contact with your  eyes,       ears, mouth and genitals (private parts).  Wash genitals (private parts)       with your normal soap.  6.  Wash thoroughly, paying special attention to the area where your surgery        will be performed.  7.  Thoroughly rinse your body with warm water from the neck down.  8.  DO NOT shower/wash with your normal soap after using and rinsing off       the CHG Soap.  9.  Pat yourself dry with a clean towel.            10.  Wear clean pajamas.            11.  Place clean sheets on your bed the night of your first shower and do not        sleep with pets.  Day of Surgery  Do not apply any lotions/deoderants the morning of surgery.  Please wear clean clothes to the hospital/surgery center.     Please read over the following fact sheets that you were given: Pain Booklet, Coughing and Deep Breathing, MRSA Information and Surgical Site Infection Prevention

## 2014-01-18 ENCOUNTER — Encounter (HOSPITAL_COMMUNITY)
Admission: RE | Admit: 2014-01-18 | Discharge: 2014-01-18 | Disposition: A | Discharge status: Home / Self Care | Payer: Medicare Other | Source: Ambulatory Visit | Attending: Neurosurgery | Admitting: Neurosurgery

## 2014-01-18 ENCOUNTER — Encounter (HOSPITAL_COMMUNITY): Payer: Self-pay

## 2014-01-18 DIAGNOSIS — Z01812 Encounter for preprocedural laboratory examination: Secondary | ICD-10-CM | POA: Insufficient documentation

## 2014-01-18 DIAGNOSIS — Z01818 Encounter for other preprocedural examination: Secondary | ICD-10-CM | POA: Insufficient documentation

## 2014-01-18 LAB — BASIC METABOLIC PANEL
Anion gap: 14 (ref 5–15)
BUN: 18 mg/dL (ref 6–23)
CO2: 25 meq/L (ref 19–32)
Calcium: 8.9 mg/dL (ref 8.4–10.5)
Chloride: 103 mEq/L (ref 96–112)
Creatinine, Ser: 0.8 mg/dL (ref 0.50–1.35)
GFR calc Af Amer: >90 mL/min (ref >90)
GFR, EST NON AFRICAN AMERICAN: 79 mL/min — AB (ref >90)
Glucose, Bld: 94 mg/dL (ref 70–99)
Potassium: 3.8 mEq/L (ref 3.7–5.3)
SODIUM: 142 meq/L (ref 137–147)

## 2014-01-18 LAB — CBC WITH DIFFERENTIAL/PLATELET
Basophils Absolute: 0 10*3/uL (ref 0.0–0.1)
Basophils Relative: 0 % (ref 0–1)
EOS PCT: 3 % (ref 0–5)
Eosinophils Absolute: 0.2 10*3/uL (ref 0.0–0.7)
HCT: 40.1 % (ref 39.0–52.0)
Hemoglobin: 12.9 g/dL — ABNORMAL LOW (ref 13.0–17.0)
LYMPHS ABS: 1.6 10*3/uL (ref 0.7–4.0)
LYMPHS PCT: 26 % (ref 12–46)
MCH: 32.1 pg (ref 26.0–34.0)
MCHC: 32.2 g/dL (ref 30.0–36.0)
MCV: 99.8 fL (ref 78.0–100.0)
MONOS PCT: 8 % (ref 3–12)
Monocytes Absolute: 0.5 10*3/uL (ref 0.1–1.0)
Neutro Abs: 4 10*3/uL (ref 1.7–7.7)
Neutrophils Relative %: 63 % (ref 43–77)
PLATELETS: 164 10*3/uL (ref 150–400)
RBC: 4.02 MIL/uL — AB (ref 4.22–5.81)
RDW: 14.1 % (ref 11.5–15.5)
WBC: 6.4 10*3/uL (ref 4.0–10.5)

## 2014-01-18 LAB — SURGICAL PCR SCREEN
MRSA, PCR: NEGATIVE
Staphylococcus aureus: NEGATIVE

## 2014-01-18 NOTE — Progress Notes (Signed)
01/18/14 0933  OBSTRUCTIVE SLEEP APNEA  Have you ever been diagnosed with sleep apnea through a sleep study? No  Do you snore loudly (loud enough to be heard through closed doors)?  0  Do you often feel tired, fatigued, or sleepy during the daytime? 1  Has anyone observed you stop breathing during your sleep? 0  Do you have, or are you being treated for high blood pressure? 1  BMI more than 35 kg/m2? 0  Age over 78 years old? 1  Neck circumference greater than 40 cm/16 inches? 0  Gender: 1  Obstructive Sleep Apnea Score 4  Score 4 or greater  Results sent to PCP

## 2014-01-18 NOTE — Progress Notes (Addendum)
Primary - dr. Lysbeth Galasnyland Does not see cardiologist. ekg and chest in dec 2014  Patient had not stopped plavix or aspirin instructed patient to stop plavix and aspirin as of today - pt verbalized understanding and stated no one had told him prior to today.

## 2014-01-20 NOTE — Progress Notes (Signed)
Pt notified of time change;new arrival time 0800-verbalized understanding

## 2014-01-22 MED ORDER — DEXAMETHASONE SODIUM PHOSPHATE 10 MG/ML IJ SOLN: 10.0000 mg | INTRAMUSCULAR | Status: DC

## 2014-01-22 MED ORDER — CEFAZOLIN SODIUM-DEXTROSE 2-3 GM-% IV SOLR: 2.0000 g | INTRAVENOUS | Status: DC

## 2014-01-23 ENCOUNTER — Observation Stay (HOSPITAL_COMMUNITY)
Admission: RE | Admit: 2014-01-23 | Discharge: 2014-01-24 | Disposition: A | Discharge status: Home / Self Care | Payer: Medicare Other | Source: Ambulatory Visit | Attending: Neurosurgery | Admitting: Neurosurgery

## 2014-01-23 ENCOUNTER — Encounter (HOSPITAL_COMMUNITY): Payer: Medicare Other | Admitting: Anesthesiology

## 2014-01-23 ENCOUNTER — Inpatient Hospital Stay (HOSPITAL_COMMUNITY): Payer: Medicare Other

## 2014-01-23 ENCOUNTER — Encounter (HOSPITAL_COMMUNITY)
Admission: RE | Disposition: A | Discharge status: Home / Self Care | Payer: Self-pay | Source: Ambulatory Visit | Attending: Neurosurgery

## 2014-01-23 ENCOUNTER — Inpatient Hospital Stay (HOSPITAL_COMMUNITY): Payer: Medicare Other | Admitting: Anesthesiology

## 2014-01-23 DIAGNOSIS — M48061 Spinal stenosis, lumbar region without neurogenic claudication: Secondary | ICD-10-CM | POA: Diagnosis present

## 2014-01-23 DIAGNOSIS — Z7982 Long term (current) use of aspirin: Secondary | ICD-10-CM | POA: Diagnosis not present

## 2014-01-23 DIAGNOSIS — I359 Nonrheumatic aortic valve disorder, unspecified: Secondary | ICD-10-CM | POA: Insufficient documentation

## 2014-01-23 DIAGNOSIS — M5126 Other intervertebral disc displacement, lumbar region: Secondary | ICD-10-CM | POA: Diagnosis present

## 2014-01-23 DIAGNOSIS — Z8673 Personal history of transient ischemic attack (TIA), and cerebral infarction without residual deficits: Secondary | ICD-10-CM | POA: Diagnosis not present

## 2014-01-23 DIAGNOSIS — Z7902 Long term (current) use of antithrombotics/antiplatelets: Secondary | ICD-10-CM | POA: Insufficient documentation

## 2014-01-23 HISTORY — PX: LUMBAR LAMINECTOMY/DECOMPRESSION MICRODISCECTOMY: SHX5026

## 2014-01-23 SURGERY — LUMBAR LAMINECTOMY/DECOMPRESSION MICRODISCECTOMY 1 LEVEL
Anesthesia: General | Site: Back | Laterality: Bilateral

## 2014-01-23 MED ORDER — HYDROCODONE-ACETAMINOPHEN 5-325 MG PO TABS
1.0000 | ORAL_TABLET | ORAL | Status: DC | PRN
  Administered 2014-01-23 – 2014-01-24 (×2): 2 via ORAL
  Filled 2014-01-23 (×2): qty 2

## 2014-01-23 MED ORDER — HYDROMORPHONE HCL PF 1 MG/ML IJ SOLN: 0.5000 mg | INTRAMUSCULAR | Status: DC | PRN

## 2014-01-23 MED ORDER — ASPIRIN EC 81 MG PO TBEC
81.0000 mg | DELAYED_RELEASE_TABLET | Freq: Every day | ORAL | Status: DC
  Administered 2014-01-23 – 2014-01-24 (×2): 81 mg via ORAL
  Filled 2014-01-23 (×2): qty 1

## 2014-01-23 MED ORDER — CEFAZOLIN SODIUM-DEXTROSE 2-3 GM-% IV SOLR
INTRAVENOUS | Status: AC
  Administered 2014-01-23: 2 g via INTRAVENOUS
  Filled 2014-01-23: qty 50

## 2014-01-23 MED ORDER — GLYCOPYRROLATE 0.2 MG/ML IJ SOLN
INTRAMUSCULAR | Status: AC
  Filled 2014-01-23: qty 3

## 2014-01-23 MED ORDER — EPHEDRINE SULFATE 50 MG/ML IJ SOLN
INTRAMUSCULAR | Status: AC
  Filled 2014-01-23: qty 1

## 2014-01-23 MED ORDER — NEOSTIGMINE METHYLSULFATE 10 MG/10ML IV SOLN
INTRAVENOUS | Status: DC | PRN
  Administered 2014-01-23: 3 mg via INTRAVENOUS

## 2014-01-23 MED ORDER — ONDANSETRON HCL 4 MG/2ML IJ SOLN: 4.0000 mg | Freq: Four times a day (QID) | INTRAMUSCULAR | Status: DC | PRN

## 2014-01-23 MED ORDER — FENTANYL CITRATE 0.05 MG/ML IJ SOLN
INTRAMUSCULAR | Status: AC
  Filled 2014-01-23: qty 5

## 2014-01-23 MED ORDER — GLYCOPYRROLATE 0.2 MG/ML IJ SOLN
INTRAMUSCULAR | Status: DC | PRN
  Administered 2014-01-23: .4 mg via INTRAVENOUS

## 2014-01-23 MED ORDER — ROCURONIUM BROMIDE 100 MG/10ML IV SOLN
INTRAVENOUS | Status: DC | PRN
  Administered 2014-01-23: 50 mg via INTRAVENOUS

## 2014-01-23 MED ORDER — SENNA 8.6 MG PO TABS
1.0000 | ORAL_TABLET | Freq: Two times a day (BID) | ORAL | Status: DC
  Administered 2014-01-23 – 2014-01-24 (×3): 8.6 mg via ORAL
  Filled 2014-01-23 (×3): qty 1

## 2014-01-23 MED ORDER — TAMSULOSIN HCL 0.4 MG PO CAPS
0.4000 mg | ORAL_CAPSULE | Freq: Every evening | ORAL | Status: DC
  Administered 2014-01-23: 0.4 mg via ORAL
  Filled 2014-01-23 (×2): qty 1

## 2014-01-23 MED ORDER — LIDOCAINE HCL (CARDIAC) 20 MG/ML IV SOLN
INTRAVENOUS | Status: AC
  Filled 2014-01-23: qty 5

## 2014-01-23 MED ORDER — DEXAMETHASONE SODIUM PHOSPHATE 4 MG/ML IJ SOLN
INTRAMUSCULAR | Status: DC | PRN
  Administered 2014-01-23: 10 mg via INTRAVENOUS

## 2014-01-23 MED ORDER — TRAVOPROST 0.004 % OP SOLN: 1.0000 [drp] | Freq: Every day | OPHTHALMIC | Status: DC

## 2014-01-23 MED ORDER — PHENYLEPHRINE 40 MCG/ML (10ML) SYRINGE FOR IV PUSH (FOR BLOOD PRESSURE SUPPORT)
PREFILLED_SYRINGE | INTRAVENOUS | Status: AC
  Filled 2014-01-23: qty 10

## 2014-01-23 MED ORDER — HEMOSTATIC AGENTS (NO CHARGE) OPTIME
TOPICAL | Status: DC | PRN
  Administered 2014-01-23: 1 via TOPICAL

## 2014-01-23 MED ORDER — ARTIFICIAL TEARS OP OINT
TOPICAL_OINTMENT | OPHTHALMIC | Status: AC
  Filled 2014-01-23: qty 3.5

## 2014-01-23 MED ORDER — BRIMONIDINE TARTRATE 0.15 % OP SOLN: 1.0000 [drp] | Freq: Three times a day (TID) | OPHTHALMIC | Status: DC

## 2014-01-23 MED ORDER — SODIUM CHLORIDE 0.9 % IJ SOLN
3.0000 mL | Freq: Two times a day (BID) | INTRAMUSCULAR | Status: DC
  Administered 2014-01-23: 3 mL via INTRAVENOUS

## 2014-01-23 MED ORDER — FENTANYL CITRATE 0.05 MG/ML IJ SOLN
INTRAMUSCULAR | Status: DC | PRN
  Administered 2014-01-23: 100 ug via INTRAVENOUS
  Administered 2014-01-23: 50 ug via INTRAVENOUS

## 2014-01-23 MED ORDER — SUCCINYLCHOLINE CHLORIDE 20 MG/ML IJ SOLN
INTRAMUSCULAR | Status: AC
Start: 2014-01-23 — End: 2014-01-23
  Filled 2014-01-23: qty 1

## 2014-01-23 MED ORDER — ACETAMINOPHEN 500 MG PO TABS: 1000.0000 mg | ORAL_TABLET | Freq: Three times a day (TID) | ORAL | Status: DC | PRN

## 2014-01-23 MED ORDER — CEFAZOLIN SODIUM 1-5 GM-% IV SOLN
1.0000 g | Freq: Three times a day (TID) | INTRAVENOUS | Status: AC
  Administered 2014-01-23: 1 g via INTRAVENOUS
  Filled 2014-01-23: qty 50

## 2014-01-23 MED ORDER — HYDROCODONE-ACETAMINOPHEN 5-325 MG PO TABS: 1.0000 | ORAL_TABLET | Freq: Four times a day (QID) | ORAL | Status: DC | PRN

## 2014-01-23 MED ORDER — ACETAMINOPHEN 650 MG RE SUPP: 650.0000 mg | RECTAL | Status: DC | PRN

## 2014-01-23 MED ORDER — SODIUM CHLORIDE 0.9 % IJ SOLN
INTRAMUSCULAR | Status: AC
  Filled 2014-01-23: qty 10

## 2014-01-23 MED ORDER — OXYCODONE HCL 5 MG PO TABS: 5.0000 mg | ORAL_TABLET | Freq: Once | ORAL | Status: DC | PRN

## 2014-01-23 MED ORDER — THROMBIN 5000 UNITS EX SOLR
CUTANEOUS | Status: DC | PRN
  Administered 2014-01-23 (×2): 5000 [IU] via TOPICAL

## 2014-01-23 MED ORDER — SODIUM CHLORIDE 0.9 % IR SOLN
Status: DC | PRN
  Administered 2014-01-23: 11:00:00

## 2014-01-23 MED ORDER — ONDANSETRON HCL 4 MG/2ML IJ SOLN
INTRAMUSCULAR | Status: DC | PRN
  Administered 2014-01-23: 4 mg via INTRAVENOUS

## 2014-01-23 MED ORDER — BRIMONIDINE TARTRATE 0.2 % OP SOLN
1.0000 [drp] | Freq: Three times a day (TID) | OPHTHALMIC | Status: DC
  Filled 2014-01-23: qty 5

## 2014-01-23 MED ORDER — MENTHOL 3 MG MT LOZG: 1.0000 | LOZENGE | OROMUCOSAL | Status: DC | PRN

## 2014-01-23 MED ORDER — OXYCODONE HCL 5 MG/5ML PO SOLN: 5.0000 mg | Freq: Once | ORAL | Status: DC | PRN

## 2014-01-23 MED ORDER — 0.9 % SODIUM CHLORIDE (POUR BTL) OPTIME
TOPICAL | Status: DC | PRN
  Administered 2014-01-23: 1000 mL

## 2014-01-23 MED ORDER — SODIUM CHLORIDE 0.9 % IJ SOLN: 3.0000 mL | INTRAMUSCULAR | Status: DC | PRN

## 2014-01-23 MED ORDER — PHENOL 1.4 % MT LIQD: 1.0000 | OROMUCOSAL | Status: DC | PRN

## 2014-01-23 MED ORDER — BUPIVACAINE HCL (PF) 0.25 % IJ SOLN
INTRAMUSCULAR | Status: DC | PRN
  Administered 2014-01-23: 22 mL

## 2014-01-23 MED ORDER — HYDROCHLOROTHIAZIDE 25 MG PO TABS
25.0000 mg | ORAL_TABLET | Freq: Every day | ORAL | Status: DC | PRN
  Filled 2014-01-23: qty 1

## 2014-01-23 MED ORDER — ONDANSETRON HCL 4 MG/2ML IJ SOLN
INTRAMUSCULAR | Status: AC
  Filled 2014-01-23: qty 2

## 2014-01-23 MED ORDER — ATORVASTATIN CALCIUM 10 MG PO TABS
10.0000 mg | ORAL_TABLET | Freq: Every day | ORAL | Status: DC
  Administered 2014-01-23: 10 mg via ORAL
  Filled 2014-01-23 (×2): qty 1

## 2014-01-23 MED ORDER — FENTANYL CITRATE 0.05 MG/ML IJ SOLN: 25.0000 ug | INTRAMUSCULAR | Status: DC | PRN

## 2014-01-23 MED ORDER — ALUM & MAG HYDROXIDE-SIMETH 200-200-20 MG/5ML PO SUSP: 30.0000 mL | Freq: Four times a day (QID) | ORAL | Status: DC | PRN

## 2014-01-23 MED ORDER — LIDOCAINE HCL (CARDIAC) 20 MG/ML IV SOLN
INTRAVENOUS | Status: DC | PRN
  Administered 2014-01-23: 100 mg via INTRAVENOUS

## 2014-01-23 MED ORDER — CYCLOBENZAPRINE HCL 10 MG PO TABS
10.0000 mg | ORAL_TABLET | Freq: Three times a day (TID) | ORAL | Status: DC | PRN
  Administered 2014-01-24: 10 mg via ORAL
  Filled 2014-01-23: qty 1

## 2014-01-23 MED ORDER — PROPOFOL 10 MG/ML IV BOLUS
INTRAVENOUS | Status: DC | PRN
  Administered 2014-01-23: 150 mg via INTRAVENOUS

## 2014-01-23 MED ORDER — ROCURONIUM BROMIDE 50 MG/5ML IV SOLN
INTRAVENOUS | Status: AC
  Filled 2014-01-23: qty 1

## 2014-01-23 MED ORDER — NEOSTIGMINE METHYLSULFATE 10 MG/10ML IV SOLN
INTRAVENOUS | Status: AC
  Filled 2014-01-23: qty 1

## 2014-01-23 MED ORDER — ONDANSETRON HCL 4 MG/2ML IJ SOLN: 4.0000 mg | INTRAMUSCULAR | Status: DC | PRN

## 2014-01-23 MED ORDER — EPHEDRINE SULFATE 50 MG/ML IJ SOLN
INTRAMUSCULAR | Status: DC | PRN
  Administered 2014-01-23: 5 mg via INTRAVENOUS
  Administered 2014-01-23: 10 mg via INTRAVENOUS
  Administered 2014-01-23: 5 mg via INTRAVENOUS

## 2014-01-23 MED ORDER — ACETAMINOPHEN 325 MG PO TABS: 650.0000 mg | ORAL_TABLET | ORAL | Status: DC | PRN

## 2014-01-23 MED ORDER — DEXAMETHASONE SODIUM PHOSPHATE 4 MG/ML IJ SOLN
INTRAMUSCULAR | Status: AC
  Filled 2014-01-23: qty 3

## 2014-01-23 MED ORDER — PROPOFOL 10 MG/ML IV BOLUS
INTRAVENOUS | Status: AC
  Filled 2014-01-23: qty 20

## 2014-01-23 MED ORDER — LATANOPROST 0.005 % OP SOLN
1.0000 [drp] | Freq: Every day | OPHTHALMIC | Status: DC
  Filled 2014-01-23: qty 2.5

## 2014-01-23 MED ORDER — OXYCODONE-ACETAMINOPHEN 5-325 MG PO TABS: 1.0000 | ORAL_TABLET | ORAL | Status: DC | PRN

## 2014-01-23 MED ORDER — LACTATED RINGERS IV SOLN
INTRAVENOUS | Status: DC
  Administered 2014-01-23 (×2): via INTRAVENOUS

## 2014-01-23 SURGICAL SUPPLY — 53 items
ADH SKN CLS APL DERMABOND .7 (GAUZE/BANDAGES/DRESSINGS) ×1
BAG DECANTER FOR FLEXI CONT (MISCELLANEOUS) ×2 IMPLANT
BENZOIN TINCTURE PRP APPL 2/3 (GAUZE/BANDAGES/DRESSINGS) ×2 IMPLANT
BLADE 10 SAFETY STRL DISP (BLADE) IMPLANT
BLADE SURG ROTATE 9660 (MISCELLANEOUS) IMPLANT
BRUSH SCRUB EZ PLAIN DRY (MISCELLANEOUS) ×2 IMPLANT
BUR CUTTER 7.0 ROUND (BURR) ×2 IMPLANT
CANISTER SUCT 3000ML (MISCELLANEOUS) ×2 IMPLANT
CONT SPEC 4OZ CLIKSEAL STRL BL (MISCELLANEOUS) ×2 IMPLANT
DECANTER SPIKE VIAL GLASS SM (MISCELLANEOUS) ×2 IMPLANT
DERMABOND ADVANCED (GAUZE/BANDAGES/DRESSINGS) ×1
DERMABOND ADVANCED .7 DNX12 (GAUZE/BANDAGES/DRESSINGS) ×1 IMPLANT
DRAPE LAPAROTOMY 100X72X124 (DRAPES) ×2 IMPLANT
DRAPE MICROSCOPE LEICA (MISCELLANEOUS) ×2 IMPLANT
DRAPE POUCH INSTRU U-SHP 10X18 (DRAPES) ×2 IMPLANT
DRAPE PROXIMA HALF (DRAPES) IMPLANT
DRAPE SURG 17X23 STRL (DRAPES) ×4 IMPLANT
DURAPREP 26ML APPLICATOR (WOUND CARE) ×2 IMPLANT
ELECT REM PT RETURN 9FT ADLT (ELECTROSURGICAL) ×2
ELECTRODE REM PT RTRN 9FT ADLT (ELECTROSURGICAL) ×1 IMPLANT
GAUZE SPONGE 4X4 16PLY XRAY LF (GAUZE/BANDAGES/DRESSINGS) IMPLANT
GLOVE BIOGEL PI IND STRL 7.5 (GLOVE) ×2 IMPLANT
GLOVE BIOGEL PI INDICATOR 7.5 (GLOVE) ×2
GLOVE ECLIPSE 6.5 STRL STRAW (GLOVE) ×2 IMPLANT
GLOVE ECLIPSE 9.0 STRL (GLOVE) ×2 IMPLANT
GLOVE EXAM NITRILE LRG STRL (GLOVE) IMPLANT
GLOVE EXAM NITRILE MD LF STRL (GLOVE) IMPLANT
GLOVE EXAM NITRILE XL STR (GLOVE) IMPLANT
GLOVE EXAM NITRILE XS STR PU (GLOVE) IMPLANT
GLOVE SS N UNI LF 7.0 STRL (GLOVE) ×6 IMPLANT
GOWN STRL REUS W/ TWL LRG LVL3 (GOWN DISPOSABLE) ×2 IMPLANT
GOWN STRL REUS W/ TWL XL LVL3 (GOWN DISPOSABLE) ×1 IMPLANT
GOWN STRL REUS W/TWL 2XL LVL3 (GOWN DISPOSABLE) IMPLANT
GOWN STRL REUS W/TWL LRG LVL3 (GOWN DISPOSABLE) ×2
GOWN STRL REUS W/TWL XL LVL3 (GOWN DISPOSABLE) ×1
KIT BASIN OR (CUSTOM PROCEDURE TRAY) ×2 IMPLANT
KIT ROOM TURNOVER OR (KITS) ×2 IMPLANT
NEEDLE HYPO 22GX1.5 SAFETY (NEEDLE) ×2 IMPLANT
NEEDLE SPNL 22GX3.5 QUINCKE BK (NEEDLE) ×2 IMPLANT
NS IRRIG 1000ML POUR BTL (IV SOLUTION) ×2 IMPLANT
PACK LAMINECTOMY NEURO (CUSTOM PROCEDURE TRAY) ×2 IMPLANT
PAD ARMBOARD 7.5X6 YLW CONV (MISCELLANEOUS) ×6 IMPLANT
RUBBERBAND STERILE (MISCELLANEOUS) ×4 IMPLANT
SPONGE GAUZE 4X4 12PLY (GAUZE/BANDAGES/DRESSINGS) ×2 IMPLANT
SPONGE SURGIFOAM ABS GEL SZ50 (HEMOSTASIS) ×2 IMPLANT
STRIP CLOSURE SKIN 1/2X4 (GAUZE/BANDAGES/DRESSINGS) ×2 IMPLANT
SUT VIC AB 2-0 CT1 18 (SUTURE) ×2 IMPLANT
SUT VIC AB 3-0 SH 8-18 (SUTURE) ×2 IMPLANT
SYR 20ML ECCENTRIC (SYRINGE) ×2 IMPLANT
TAPE STRIPS DRAPE STRL (GAUZE/BANDAGES/DRESSINGS) ×2 IMPLANT
TOWEL OR 17X24 6PK STRL BLUE (TOWEL DISPOSABLE) ×2 IMPLANT
TOWEL OR 17X26 10 PK STRL BLUE (TOWEL DISPOSABLE) ×2 IMPLANT
WATER STERILE IRR 1000ML POUR (IV SOLUTION) ×2 IMPLANT

## 2014-01-23 NOTE — Transfer of Care (Signed)
Immediate Anesthesia Transfer of Care Note  Patient: Paul Bradshaw  Procedure(s) Performed: Procedure(s) with comments: LUMBAR LAMINECTOMY/DECOMPRESSION MICRODISCECTOMY 1 LEVEL L5-S1 (Bilateral) - LUMBAR LAMINECTOMY/DECOMPRESSION MICRODISCECTOMY 1 LEVEL L5-S1  Patient Location: PACU  Anesthesia Type:General  Level of Consciousness: awake, alert  and oriented  Airway & Oxygen Therapy: Patient Spontanous Breathing and Patient connected to nasal cannula oxygen  Post-op Assessment: Report given to PACU RN and Post -op Vital signs reviewed and stable  Post vital signs: Reviewed and stable  Complications: No apparent anesthesia complications

## 2014-01-23 NOTE — H&P (Signed)
Paul Bradshaw is an 80 y.o. male.   Chief Complaint: Back and bilateral leg pain HPI: 80 year old male status post L4-5 decompressive laminectomy presents with severe back and bilateral lower extremity pain right greater than left. Workup demonstrates evidence of a superior disc herniation L5-S1 bias toward the right causing marked compression of the thecal sac and L5 vertebrae on the right. Patient presents now for L5-S1 decompressive laminectomy with microdiscectomy in hopes of improving his symptoms.  Past Medical History  Diagnosis Date  . Symptomatic carotid artery stenosis with infarction 2005 or 2006    Status post right carotid endarterectomy  . Arthritis   . Staphylococcus aureus bacteremia 08/09/2012    TEE negative for vegetation or thrombus February 2014  . BPH (benign prostatic hyperplasia)   . Aortic regurgitation     Moderate  . Stroke 2005 or 2006    3    Past Surgical History  Procedure Laterality Date  . Carotid endarterectomy    . Kidney stone surgery    . Tee without cardioversion N/A 08/12/2012    Procedure: TRANSESOPHAGEAL ECHOCARDIOGRAM (TEE);  Surgeon: Wendall StadePeter C Nishan, MD;  Location: [facility];  Service: Cardiovascular;  Laterality: N/A;  . Back surgery    . Tee without cardioversion N/A 06/24/2013    Procedure: TRANSESOPHAGEAL ECHOCARDIOGRAM (TEE);  Surgeon: Jonelle SidleSamuel G McDowell, MD;  Location: [facility];  Service: Endoscopy;  Laterality: N/A;  . Cholecystectomy      Family History  Problem Relation Age of Onset  . Emphysema Father   . Alzheimer's disease Mother    Social History:  reports that he has never smoked. He does not have any smokeless tobacco history on file. He reports that he does not drink alcohol or use illicit drugs.  Allergies: No Known Allergies  Medications Prior to Admission  Medication Sig Dispense Refill  . acetaminophen (TYLENOL) 500 MG tablet Take 1,000 mg by mouth every 8 (eight) hours as needed for moderate pain.      Marland Kitchen.  aspirin EC 81 MG tablet Take 81 mg by mouth daily.      Marland Kitchen. atorvastatin (LIPITOR) 10 MG tablet Take 1 tablet (10 mg total) by mouth daily at 6 PM.  30 tablet  0  . brimonidine (ALPHAGAN P) 0.1 % SOLN Apply 1 drop to eye every 8 (eight) hours.      . clopidogrel (PLAVIX) 75 MG tablet Take 1 tablet (75 mg total) by mouth daily with breakfast.  30 tablet  0  . HYDROcodone-acetaminophen (NORCO/VICODIN) 5-325 MG per tablet Take 1-2 tablets by mouth every 6 (six) hours as needed for moderate pain.      . Multiple Vitamins-Minerals (MULTIVITAMIN PO) Take 1 tablet by mouth daily.      . Tamsulosin HCl (FLOMAX) 0.4 MG CAPS Take 0.4 mg by mouth every evening.      . travoprost, benzalkonium, (TRAVATAN) 0.004 % ophthalmic solution Place 1 drop into both eyes at bedtime.      . hydrochlorothiazide (HYDRODIURIL) 25 MG tablet Take 1 tablet by mouth daily as needed (edema).         No results found for this or any previous visit (from the past 48 hour(s)). No results found.  Review of Systems  Constitutional: Negative.   HENT: Negative.   Eyes: Negative.   Respiratory: Negative.   Cardiovascular: Negative.   Gastrointestinal: Negative.   Genitourinary: Negative.   Musculoskeletal: Negative.   Skin: Negative.   Neurological: Negative.   Endo/Heme/Allergies: Negative.  Psychiatric/Behavioral: Negative.     Blood pressure 184/84, pulse 82, temperature 98 F (36.7 C), temperature source Oral, resp. rate 18, weight 76.658 kg (169 lb), SpO2 100.00%. Physical Exam  Constitutional: He is oriented to person, place, and time. He appears well-developed and well-nourished.  HENT:  Head: Normocephalic and atraumatic.  Right Ear: External ear normal.  Left Ear: External ear normal.  Nose: Nose normal.  Mouth/Throat: Oropharynx is clear and moist.  Eyes: Conjunctivae are normal. Pupils are equal, round, and reactive to light. Right eye exhibits no discharge. Left eye exhibits no discharge.  Neck: Normal  range of motion. Neck supple. No tracheal deviation present. No thyromegaly present.  Cardiovascular: Normal rate, regular rhythm, normal heart sounds and intact distal pulses.  Exam reveals no friction rub.   No murmur heard. Respiratory: Effort normal and breath sounds normal. No respiratory distress. He has no wheezes.  GI: Soft. Bowel sounds are normal. He exhibits no distension. There is no tenderness.  Musculoskeletal: Normal range of motion. He exhibits no edema and no tenderness.  Neurological: He is alert and oriented to person, place, and time. He has normal reflexes. No cranial nerve deficit. Coordination normal.  Skin: Skin is warm and dry. No rash noted. He is not diaphoretic. No erythema. No pallor.  Psychiatric: He has a normal mood and affect. His behavior is normal. Judgment and thought content normal.     Assessment/Plan Right paracentral L5-S1 superior disc herniation with stenosis and radiculopathy. Plan L5-S1 decompressive laminectomy with right L5-S1 microdiscectomy. Risks and benefits of explained. Patient wishes to proceed.  Myangel Summons A 01/23/2014, 10:21 AM

## 2014-01-23 NOTE — Anesthesia Preprocedure Evaluation (Signed)
Anesthesia Evaluation  Patient identified by MRN, date of birth, ID band Patient awake    Reviewed: Allergy & Precautions, H&P , NPO status , Patient's Chart, lab work & pertinent test results  Airway Mallampati: II  Neck ROM: full    Dental   Pulmonary neg pulmonary ROS,          Cardiovascular + Peripheral Vascular Disease + Valvular Problems/Murmurs AI     Neuro/Psych CVA    GI/Hepatic   Endo/Other    Renal/GU      Musculoskeletal  (+) Arthritis -,   Abdominal   Peds  Hematology   Anesthesia Other Findings   Reproductive/Obstetrics                           Anesthesia Physical Anesthesia Plan  ASA: III  Anesthesia Plan: General   Post-op Pain Management:    Induction: Intravenous  Airway Management Planned: Oral ETT  Additional Equipment:   Intra-op Plan:   Post-operative Plan: Extubation in OR  Informed Consent: I have reviewed the patients History and Physical, chart, labs and discussed the procedure including the risks, benefits and alternatives for the proposed anesthesia with the patient or authorized representative who has indicated his/her understanding and acceptance.     Plan Discussed with: CRNA, Anesthesiologist and Surgeon  Anesthesia Plan Comments:         Anesthesia Quick Evaluation

## 2014-01-23 NOTE — Brief Op Note (Signed)
01/23/2014  12:37 PM  PATIENT:  Paul ClockBobby L Sow  80 y.o. male  PRE-OPERATIVE DIAGNOSIS:  HNP  POST-OPERATIVE DIAGNOSIS:  herniated nucleus pulposus  PROCEDURE:  Procedure(s) with comments: LUMBAR LAMINECTOMY/DECOMPRESSION MICRODISCECTOMY 1 LEVEL L5-S1 (Bilateral) - LUMBAR LAMINECTOMY/DECOMPRESSION MICRODISCECTOMY 1 LEVEL L5-S1  SURGEON:  Surgeon(s) and Role:    * Temple PaciniHenry A Amman Bartel, MD - Primary    * Carmela HurtKyle L Cabbell, MD - Assisting  PHYSICIAN ASSISTANT:   ASSISTANTS:    ANESTHESIA:   general  EBL:  Total I/O In: 1000 [I.V.:1000] Out: 150 [Blood:150]  BLOOD ADMINISTERED:none  DRAINS: none   LOCAL MEDICATIONS USED:  NONE  SPECIMEN:  No Specimen  DISPOSITION OF SPECIMEN:  N/A  COUNTS:  YES  TOURNIQUET:  * No tourniquets in log *  DICTATION: .Dragon Dictation  PLAN OF CARE: Admit to inpatient   PATIENT DISPOSITION:  PACU - hemodynamically stable.   Delay start of Pharmacological VTE agent (>24hrs) due to surgical blood loss or risk of bleeding: yes

## 2014-01-23 NOTE — Anesthesia Postprocedure Evaluation (Signed)
Anesthesia Post Note  Patient: Paul Bradshaw  Procedure(s) Performed: Procedure(s) (LRB): LUMBAR LAMINECTOMY/DECOMPRESSION MICRODISCECTOMY 1 LEVEL L5-S1 (Bilateral)  Anesthesia type: General  Patient location: PACU  Post pain: Pain level controlled and Adequate analgesia  Post assessment: Post-op Vital signs reviewed, Patient's Cardiovascular Status Stable, Respiratory Function Stable, Patent Airway and Pain level controlled  Last Vitals:  Filed Vitals:   01/23/14 1309  BP: 118/60  Pulse: 88  Temp:   Resp: 20    Post vital signs: Reviewed and stable  Level of consciousness: awake, alert  and oriented  Complications: No apparent anesthesia complications

## 2014-01-23 NOTE — Op Note (Signed)
Date of procedure: 01/23/2014  Date of dictation: Same  Service: Neurosurgery  Preoperative diagnosis: L5-S1 stenosis and L5-S1 herniated nucleus pulposus  Postoperative diagnosis: Same  Procedure Name: L5-S1 redo decompressive laminectomy with bilateral L5 and S1 foraminotomies. Right L5-S1 microdiscectomy  Surgeon:Sharnese Heath A.Yamen Castrogiovanni, M.D.  Asst. Surgeon: Franky Machoabbell  Anesthesia: General  Indication: 80 year old male status post previous L3-L5 decompressive laminectomy presents now with back and bilateral lower cavity symptoms left greater than right. Workup demonstrates evidence of this stenosis at L5-S1 with evidence of a superiorly migrated disc herniation paracentrally on the right with thecal sac and right L5 nerve root compression. Patient presents now for decompressive laminectomy and right L5-S1 microdiscectomy. In [place] F[place]  Operative note:Operative patient taken operating postop we'll supin and then wee position after an ankle up was achieved patient is positioned prone onto Wilson frame and appropriate padded. Lumbar region prepped and draped. Incision made overlying L5-S1. This carried down sharply in the midline. Previous laminectomy site was identified. Lamina of L5 and S1 were dissected free. Retractor placed. X-ray taken. Level confirmed. Decompressive laminectomies then performed using Leksell rongeurs Kerrison rongeurs and a high-speed drill to remove the entire lamina of L5 medial aspect the L5-S1 facet joints bilaterally and the superior aspect of the S1 lamina. Ligament flavum was elevated and resected within the gutters of completing decompressive foraminotomies along the L5 and S1 nerve roots bilaterally. Microscope brought field these were microdissection. Starting first the patient's right side thecal sac and nerve root were gently mobilized track towards midline. Disc herniation was encountered in the axilla of the right L5 nerve root. This dissected free and removed. The disc space  itself at L5-S1 was not entered. Expiration on the left side the spinal canal was difficult secondary epidural scarring. I could see no evidence of disc herniation causing compression on the side. Wound is irrigated out like solution. Gelfoam was placed topically for hemostasis which then the good. Microscope and retractor system were removed retained cyst muscular chart was and close in layers with Vicryl sutures. Steri-Strips triggers were applied. There were no apparent complications. Patient tolerated the procedure well and he returns to the recovery room postop.

## 2014-01-23 NOTE — Plan of Care (Signed)
Problem: Consults Goal: Diagnosis - Spinal Surgery Outcome: Completed/Met Date Met:  01/23/14 Lumbar Laminectomy (Complex)

## 2014-01-24 DIAGNOSIS — M5126 Other intervertebral disc displacement, lumbar region: Secondary | ICD-10-CM | POA: Diagnosis not present

## 2014-01-24 MED ORDER — HYDROCODONE-ACETAMINOPHEN 5-325 MG PO TABS: 1.0000 | ORAL_TABLET | Freq: Four times a day (QID) | ORAL | Status: DC | PRN

## 2014-01-24 MED ORDER — CYCLOBENZAPRINE HCL 7.5 MG PO TABS: 7.5000 mg | ORAL_TABLET | Freq: Three times a day (TID) | ORAL | Status: DC | PRN

## 2014-01-24 NOTE — Discharge Instructions (Signed)

## 2014-01-24 NOTE — Discharge Summary (Signed)
  Physician Discharge Summary  Patient ID: Paul ClockBobby L Harmon MRN: [id number] DOB/AGE: 11/18/1925 80 y.o.  Admit date: 01/23/2014 Discharge date: 01/24/2014  Admission Diagnoses:  Discharge Diagnoses:  Principal Problem:   HNP (herniated nucleus pulposus), lumbar Active Problems:   Lumbar stenosis   Discharged Condition: good  Hospital Course: The patient was admitted to the hospital oriented when an uncomplicated L5-S1 decompressive laminectomy and foraminotomies. Postoperatively he is doing well. His back and right lower extremity pain is much better. He standing and walking without difficulty and is ready for discharge home.  Consults:   Significant Diagnostic Studies:   Treatments:   Discharge Exam: Blood pressure 132/68, pulse 97, temperature 98.5 F (36.9 C), temperature source Oral, resp. rate 20, weight 76.658 kg (169 lb), SpO2 97.00%. Awake and alert. Oriented and appropriate. Motor and sensory function intact. Wound clean and dry. Chest and abdomen benign.  Disposition: 01-Home or Self Care     Medication List         acetaminophen 500 MG tablet  Commonly known as:  TYLENOL  Take 1,000 mg by mouth every 8 (eight) hours as needed for moderate pain.     aspirin EC 81 MG tablet  Take 81 mg by mouth daily.     atorvastatin 10 MG tablet  Commonly known as:  LIPITOR  Take 1 tablet (10 mg total) by mouth daily at 6 PM.     brimonidine 0.1 % Soln  Commonly known as:  ALPHAGAN P  Apply 1 drop to eye every 8 (eight) hours.     clopidogrel 75 MG tablet  Commonly known as:  PLAVIX  Take 1 tablet (75 mg total) by mouth daily with breakfast.     cyclobenzaprine 7.5 MG tablet  Commonly known as:  FEXMID  Take 1 tablet (7.5 mg total) by mouth 3 (three) times daily as needed for muscle spasms.     hydrochlorothiazide 25 MG tablet  Commonly known as:  HYDRODIURIL  Take 1 tablet by mouth daily as needed (edema).     HYDROcodone-acetaminophen 5-325 MG per tablet   Commonly known as:  NORCO/VICODIN  Take 1-2 tablets by mouth every 6 (six) hours as needed for moderate pain.     MULTIVITAMIN PO  Take 1 tablet by mouth daily.     tamsulosin 0.4 MG Caps capsule  Commonly known as:  FLOMAX  Take 0.4 mg by mouth every evening.     travoprost (benzalkonium) 0.004 % ophthalmic solution  Commonly known as:  TRAVATAN  Place 1 drop into both eyes at bedtime.         Signed: Xachary Hambly A 01/24/2014, 10:25 AM

## 2014-01-24 NOTE — Progress Notes (Signed)
Patient alert and oriented, mae's well, voiding adequate amount of urine, swallowing without difficulty, c/o minimal pain and meds given prior to discharged. Patient discharged home with family. Script and discharged instructions given to patient. Patient and family stated understanding of instructions given.

## 2014-01-26 ENCOUNTER — Encounter (HOSPITAL_COMMUNITY): Payer: Self-pay | Admitting: Neurosurgery

## 2014-05-01 ENCOUNTER — Other Ambulatory Visit (HOSPITAL_COMMUNITY): Payer: Self-pay | Admitting: Family Medicine

## 2014-05-01 DIAGNOSIS — S0093XA Contusion of unspecified part of head, initial encounter: Secondary | ICD-10-CM

## 2014-05-03 ENCOUNTER — Ambulatory Visit (HOSPITAL_COMMUNITY)
Admission: RE | Admit: 2014-05-03 | Discharge: 2014-05-03 | Disposition: A | Discharge status: Home / Self Care | Payer: Medicare Other | Source: Ambulatory Visit | Attending: Family Medicine | Admitting: Family Medicine

## 2014-05-03 DIAGNOSIS — S0990XA Unspecified injury of head, initial encounter: Secondary | ICD-10-CM | POA: Diagnosis not present

## 2014-05-03 DIAGNOSIS — R51 Headache: Secondary | ICD-10-CM | POA: Diagnosis present

## 2014-05-03 DIAGNOSIS — W19XXXA Unspecified fall, initial encounter: Secondary | ICD-10-CM | POA: Diagnosis not present

## 2014-05-03 DIAGNOSIS — G319 Degenerative disease of nervous system, unspecified: Secondary | ICD-10-CM | POA: Insufficient documentation

## 2014-05-03 DIAGNOSIS — S0093XA Contusion of unspecified part of head, initial encounter: Secondary | ICD-10-CM

## 2015-02-09 NOTE — Patient Instructions (Signed)
PISTOL KESSENICH  02/09/2015     @   Your procedure is scheduled on 02/15/2015  Report to [facility] at 10:20 A.M.  Call this number if you have problems the morning of surgery:  [phone]   Remember:  Do not eat food or drink liquids after midnight.  Take these medicines the morning of surgery with A SIP OF WATER Flomax   Do not wear jewelry, make-up or nail polish.  Do not wear lotions, powders, or perfumes.  You may wear deodorant.  Do not shave 48 hours prior to surgery.  Men may shave face and neck.  Do not bring valuables to the hospital.  [facility] is not responsible for any belongings or valuables.  Contacts, dentures or bridgework may not be worn into surgery.  Leave your suitcase in the car.  After surgery it may be brought to your room.  For patients admitted to the hospital, discharge time will be determined by your treatment team.  Patients discharged the day of surgery will not be allowed to drive home.    Please read over the following fact sheets that you were given. Anesthesia Post-op Instructions     PATIENT INSTRUCTIONS POST-ANESTHESIA  IMMEDIATELY FOLLOWING SURGERY:  Do not drive or operate machinery for the first twenty four hours after surgery.  Do not make any important decisions for twenty four hours after surgery or while taking narcotic pain medications or sedatives.  If you develop intractable nausea and vomiting or a severe headache please notify your doctor immediately.  FOLLOW-UP:  Please make an appointment with your surgeon as instructed. You do not need to follow up with anesthesia unless specifically instructed to do so.  WOUND CARE INSTRUCTIONS (if applicable):  Keep a dry clean dressing on the anesthesia/puncture wound site if there is drainage.  Once the wound has quit draining you may leave it open to air.  Generally you should leave the bandage intact for twenty four hours unless there is drainage.  If the epidural  site drains for more than 36-48 hours please call the anesthesia department.  QUESTIONS?:  Please feel free to call your physician or the hospital operator if you have any questions, and they will be happy to assist you.      Cataract Surgery  A cataract is a clouding of the lens of the eye. When a lens becomes cloudy, vision is reduced based on the degree and nature of the clouding. Surgery may be needed to improve vision. Surgery removes the cloudy lens and usually replaces it with a substitute lens (intraocular lens, IOL). LET YOUR EYE DOCTOR KNOW ABOUT:  Allergies to food or medicine.  Medicines taken including herbs, eye drops, over-the-counter medicines, and creams.  Use of steroids (by mouth or creams).  Previous problems with anesthetics or numbing medicine.  History of bleeding problems or blood clots.  Previous surgery.  Other health problems, including diabetes and kidney problems.  Possibility of pregnancy, if this applies. RISKS AND COMPLICATIONS  Infection.  Inflammation of the eyeball (endophthalmitis) that can spread to both eyes (sympathetic ophthalmia).  Poor wound healing.  If an IOL is inserted, it can later fall out of proper position. This is very uncommon.  Clouding of the part of your eye that holds an IOL in place. This is called an "after-cataract." These are uncommon but easily treated. BEFORE THE PROCEDURE  Do not eat or drink anything except small amounts of water for 8 to 12 before your  surgery, or as directed by your caregiver.  Unless you are told otherwise, continue any eye drops you have been prescribed.  Talk to your primary caregiver about all other medicines that you take (both prescription and nonprescription). In some cases, you may need to stop or change medicines near the time of your surgery. This is most important if you are taking blood-thinning medicine.Do not stop medicines unless you are told to do so.  Arrange for someone to  drive you to and from the procedure.  Do not put contact lenses in either eye on the day of your surgery. PROCEDURE There is more than one method for safely removing a cataract. Your doctor can explain the differences and help determine which is best for you. Phacoemulsification surgery is the most common form of cataract surgery.  An injection is given behind the eye or eye drops are given to make this a painless procedure.  A small cut (incision) is made on the edge of the clear, dome-shaped surface that covers the front of the eye (cornea).  A tiny probe is painlessly inserted into the eye. This device gives off ultrasound waves that soften and break up the cloudy center of the lens. This makes it easier for the cloudy lens to be removed by suction.  An IOL may be implanted.  The normal lens of the eye is covered by a clear capsule. Part of that capsule is intentionally left in the eye to support the IOL.  Your surgeon may or may not use stitches to close the incision. There are other forms of cataract surgery that require a larger incision and stitches to close the eye. This approach is taken in cases where the doctor feels that the cataract cannot be easily removed using phacoemulsification. AFTER THE PROCEDURE  When an IOL is implanted, it does not need care. It becomes a permanent part of your eye and cannot be seen or felt.  Your doctor will schedule follow-up exams to check on your progress.  Review your other medicines with your doctor to see which can be resumed after surgery.  Use eye drops or take medicine as prescribed by your doctor. Document Released: 05/29/2011 Document Revised: 10/24/2013 Document Reviewed: 05/29/2011 [facility] Patient Information 2015 [place], [place]. This information is not intended to replace advice given to you by your health care provider. Make sure you discuss any questions you have with your health care provider.

## 2015-02-12 ENCOUNTER — Other Ambulatory Visit: Payer: Self-pay

## 2015-02-12 ENCOUNTER — Encounter (HOSPITAL_COMMUNITY): Payer: Self-pay

## 2015-02-12 ENCOUNTER — Encounter (HOSPITAL_COMMUNITY)
Admission: RE | Admit: 2015-02-12 | Discharge: 2015-02-12 | Disposition: A | Discharge status: Home / Self Care | Payer: Medicare Other | Source: Ambulatory Visit | Attending: Ophthalmology | Admitting: Ophthalmology

## 2015-02-12 DIAGNOSIS — M1990 Unspecified osteoarthritis, unspecified site: Secondary | ICD-10-CM | POA: Diagnosis not present

## 2015-02-12 DIAGNOSIS — Z7902 Long term (current) use of antithrombotics/antiplatelets: Secondary | ICD-10-CM | POA: Diagnosis not present

## 2015-02-12 DIAGNOSIS — Z01810 Encounter for preprocedural cardiovascular examination: Secondary | ICD-10-CM | POA: Diagnosis not present

## 2015-02-12 DIAGNOSIS — Z7982 Long term (current) use of aspirin: Secondary | ICD-10-CM | POA: Diagnosis not present

## 2015-02-12 DIAGNOSIS — Z01812 Encounter for preprocedural laboratory examination: Secondary | ICD-10-CM | POA: Diagnosis not present

## 2015-02-12 DIAGNOSIS — Z8673 Personal history of transient ischemic attack (TIA), and cerebral infarction without residual deficits: Secondary | ICD-10-CM | POA: Diagnosis not present

## 2015-02-12 DIAGNOSIS — H2181 Floppy iris syndrome: Secondary | ICD-10-CM | POA: Diagnosis not present

## 2015-02-12 DIAGNOSIS — H2512 Age-related nuclear cataract, left eye: Secondary | ICD-10-CM | POA: Diagnosis present

## 2015-02-14 MED ORDER — TETRACAINE HCL 0.5 % OP SOLN
OPHTHALMIC | Status: AC
  Filled 2015-02-14: qty 2

## 2015-02-14 MED ORDER — CYCLOPENTOLATE-PHENYLEPHRINE OP SOLN OPTIME - NO CHARGE
OPHTHALMIC | Status: AC
  Filled 2015-02-14: qty 2

## 2015-02-14 MED ORDER — LIDOCAINE HCL (PF) 1 % IJ SOLN
INTRAMUSCULAR | Status: AC
  Filled 2015-02-14: qty 2

## 2015-02-14 MED ORDER — LIDOCAINE HCL 3.5 % OP GEL
OPHTHALMIC | Status: AC
  Filled 2015-02-14: qty 1

## 2015-02-14 MED ORDER — PHENYLEPHRINE HCL 2.5 % OP SOLN
OPHTHALMIC | Status: AC
  Filled 2015-02-14: qty 15

## 2015-02-14 MED ORDER — NEOMYCIN-POLYMYXIN-DEXAMETH 3.5-10000-0.1 OP SUSP
OPHTHALMIC | Status: AC
  Filled 2015-02-14: qty 5

## 2015-02-15 ENCOUNTER — Ambulatory Visit (HOSPITAL_COMMUNITY)
Admission: RE | Admit: 2015-02-15 | Discharge: 2015-02-15 | Disposition: A | Discharge status: Home / Self Care | Payer: Medicare Other | Source: Ambulatory Visit | Attending: Ophthalmology | Admitting: Ophthalmology

## 2015-02-15 ENCOUNTER — Encounter (HOSPITAL_COMMUNITY): Payer: Self-pay | Admitting: Ophthalmology

## 2015-02-15 ENCOUNTER — Ambulatory Visit (HOSPITAL_COMMUNITY): Payer: Medicare Other | Admitting: Anesthesiology

## 2015-02-15 ENCOUNTER — Encounter (HOSPITAL_COMMUNITY)
Admission: RE | Disposition: A | Discharge status: Home / Self Care | Payer: Self-pay | Source: Ambulatory Visit | Attending: Ophthalmology

## 2015-02-15 DIAGNOSIS — H2512 Age-related nuclear cataract, left eye: Secondary | ICD-10-CM | POA: Insufficient documentation

## 2015-02-15 DIAGNOSIS — Z01810 Encounter for preprocedural cardiovascular examination: Secondary | ICD-10-CM | POA: Insufficient documentation

## 2015-02-15 DIAGNOSIS — Z8673 Personal history of transient ischemic attack (TIA), and cerebral infarction without residual deficits: Secondary | ICD-10-CM | POA: Insufficient documentation

## 2015-02-15 DIAGNOSIS — M1990 Unspecified osteoarthritis, unspecified site: Secondary | ICD-10-CM | POA: Insufficient documentation

## 2015-02-15 DIAGNOSIS — H2181 Floppy iris syndrome: Secondary | ICD-10-CM | POA: Diagnosis not present

## 2015-02-15 DIAGNOSIS — Z01812 Encounter for preprocedural laboratory examination: Secondary | ICD-10-CM | POA: Diagnosis not present

## 2015-02-15 DIAGNOSIS — Z7982 Long term (current) use of aspirin: Secondary | ICD-10-CM | POA: Insufficient documentation

## 2015-02-15 DIAGNOSIS — Z7902 Long term (current) use of antithrombotics/antiplatelets: Secondary | ICD-10-CM | POA: Insufficient documentation

## 2015-02-15 HISTORY — PX: CATARACT EXTRACTION W/PHACO: SHX586

## 2015-02-15 LAB — POCT I-STAT, CHEM 8
BUN: 21 mg/dL — ABNORMAL HIGH (ref 6–20)
Calcium, Ion: 1.1 mmol/L — ABNORMAL LOW (ref 1.13–1.30)
Chloride: 101 mmol/L (ref 101–111)
Creatinine, Ser: 0.9 mg/dL (ref 0.61–1.24)
Glucose, Bld: 102 mg/dL — ABNORMAL HIGH (ref 65–99)
HEMATOCRIT: 43 % (ref 39.0–52.0)
HEMOGLOBIN: 14.6 g/dL (ref 13.0–17.0)
Potassium: 4.2 mmol/L (ref 3.5–5.1)
Sodium: 138 mmol/L (ref 135–145)
TCO2: 23 mmol/L (ref 0–100)

## 2015-02-15 SURGERY — PHACOEMULSIFICATION, CATARACT, WITH IOL INSERTION
Anesthesia: Monitor Anesthesia Care | Site: Eye | Laterality: Left

## 2015-02-15 MED ORDER — LACTATED RINGERS IV SOLN
INTRAVENOUS | Status: DC
  Administered 2015-02-15: 11:00:00 via INTRAVENOUS

## 2015-02-15 MED ORDER — TETRACAINE HCL 0.5 % OP SOLN
1.0000 [drp] | OPHTHALMIC | Status: AC
  Administered 2015-02-15 (×3): 1 [drp] via OPHTHALMIC

## 2015-02-15 MED ORDER — PHENYLEPHRINE HCL 2.5 % OP SOLN
OPHTHALMIC | Status: AC
  Filled 2015-02-15: qty 15

## 2015-02-15 MED ORDER — NEOMYCIN-POLYMYXIN-DEXAMETH 3.5-10000-0.1 OP SUSP
OPHTHALMIC | Status: DC | PRN
  Administered 2015-02-15: 2 [drp] via OPHTHALMIC

## 2015-02-15 MED ORDER — POVIDONE-IODINE 5 % OP SOLN
OPHTHALMIC | Status: DC | PRN
  Administered 2015-02-15: 1 via OPHTHALMIC

## 2015-02-15 MED ORDER — LIDOCAINE HCL 3.5 % OP GEL
OPHTHALMIC | Status: AC
  Filled 2015-02-15: qty 1

## 2015-02-15 MED ORDER — PHENYLEPHRINE HCL 2.5 % OP SOLN
1.0000 [drp] | OPHTHALMIC | Status: AC
  Administered 2015-02-15 (×3): 1 [drp] via OPHTHALMIC

## 2015-02-15 MED ORDER — FENTANYL CITRATE (PF) 100 MCG/2ML IJ SOLN
INTRAMUSCULAR | Status: AC
  Filled 2015-02-15: qty 2

## 2015-02-15 MED ORDER — TETRACAINE HCL 0.5 % OP SOLN
OPHTHALMIC | Status: AC
  Filled 2015-02-15: qty 2

## 2015-02-15 MED ORDER — LIDOCAINE HCL 3.5 % OP GEL
1.0000 "application " | Freq: Once | OPHTHALMIC | Status: AC
  Administered 2015-02-15: 1 via OPHTHALMIC

## 2015-02-15 MED ORDER — NEOMYCIN-POLYMYXIN-DEXAMETH 3.5-10000-0.1 OP SUSP
OPHTHALMIC | Status: AC
  Filled 2015-02-15: qty 5

## 2015-02-15 MED ORDER — MIDAZOLAM HCL 2 MG/2ML IJ SOLN
INTRAMUSCULAR | Status: AC
  Filled 2015-02-15: qty 2

## 2015-02-15 MED ORDER — LIDOCAINE HCL (PF) 1 % IJ SOLN
INTRAMUSCULAR | Status: AC
  Filled 2015-02-15: qty 2

## 2015-02-15 MED ORDER — PROVISC 10 MG/ML IO SOLN
INTRAOCULAR | Status: DC | PRN
  Administered 2015-02-15: 0.85 mL via INTRAOCULAR

## 2015-02-15 MED ORDER — LIDOCAINE HCL (PF) 1 % IJ SOLN
INTRAOCULAR | Status: DC | PRN
  Administered 2015-02-15: .9 mL via OPHTHALMIC

## 2015-02-15 MED ORDER — CYCLOPENTOLATE-PHENYLEPHRINE 0.2-1 % OP SOLN
1.0000 [drp] | OPHTHALMIC | Status: AC
  Administered 2015-02-15 (×3): 1 [drp] via OPHTHALMIC

## 2015-02-15 MED ORDER — BSS IO SOLN
INTRAOCULAR | Status: DC | PRN
  Administered 2015-02-15: 15 mL

## 2015-02-15 MED ORDER — FENTANYL CITRATE (PF) 100 MCG/2ML IJ SOLN
25.0000 ug | INTRAMUSCULAR | Status: AC
  Administered 2015-02-15: 25 ug via INTRAVENOUS

## 2015-02-15 MED ORDER — CYCLOPENTOLATE-PHENYLEPHRINE OP SOLN OPTIME - NO CHARGE
OPHTHALMIC | Status: AC
  Filled 2015-02-15: qty 2

## 2015-02-15 MED ORDER — MIDAZOLAM HCL 2 MG/2ML IJ SOLN
1.0000 mg | INTRAMUSCULAR | Status: DC | PRN
  Administered 2015-02-15: 2 mg via INTRAVENOUS

## 2015-02-15 MED ORDER — EPINEPHRINE HCL 1 MG/ML IJ SOLN
INTRAOCULAR | Status: DC | PRN
  Administered 2015-02-15: 500 mL

## 2015-02-15 SURGICAL SUPPLY — 13 items
CLOTH BEACON ORANGE TIMEOUT ST (SAFETY) ×2 IMPLANT
Capsular Tension Ring (Ring) ×2 IMPLANT
EYE SHIELD UNIVERSAL CLEAR (GAUZE/BANDAGES/DRESSINGS) ×2 IMPLANT
GLOVE BIOGEL PI IND STRL 6.5 (GLOVE) ×1 IMPLANT
GLOVE BIOGEL PI INDICATOR 6.5 (GLOVE) ×1
GLOVE EXAM NITRILE MD LF STRL (GLOVE) ×2 IMPLANT
PAD ARMBOARD 7.5X6 YLW CONV (MISCELLANEOUS) ×2 IMPLANT
SIGHTPATH CAT PROC W REG LENS (Ophthalmic Related) ×2 IMPLANT
SYRINGE LUER LOK 1CC (MISCELLANEOUS) ×2 IMPLANT
TAPE SURG TRANSPORE 1 IN (GAUZE/BANDAGES/DRESSINGS) ×1 IMPLANT
TAPE SURGICAL TRANSPORE 1 IN (GAUZE/BANDAGES/DRESSINGS) ×1
VISCOELASTIC ADDITIONAL (OPHTHALMIC RELATED) ×2 IMPLANT
WATER STERILE IRR 250ML POUR (IV SOLUTION) ×2 IMPLANT

## 2015-02-15 NOTE — H&P (Signed)
I have reviewed the H&P, the patient was re-examined, and I have identified no interval changes in medical condition and plan of care since the history and physical of record  

## 2015-02-15 NOTE — Anesthesia Preprocedure Evaluation (Signed)
Anesthesia Evaluation  Patient identified by MRN, date of birth, ID band Patient awake    Reviewed: Allergy & Precautions, H&P , NPO status , Patient's Chart, lab work & pertinent test results  Airway Mallampati: II   Neck ROM: full    Dental  (+) Edentulous Upper, Edentulous Lower   Pulmonary neg pulmonary ROS,  breath sounds clear to auscultation        Cardiovascular + Peripheral Vascular Disease + Valvular Problems/Murmurs AI Rhythm:Regular Rate:Normal     Neuro/Psych CVA, No Residual Symptoms    GI/Hepatic negative GI ROS,   Endo/Other    Renal/GU      Musculoskeletal  (+) Arthritis -,   Abdominal   Peds  Hematology   Anesthesia Other Findings   Reproductive/Obstetrics                             Anesthesia Physical Anesthesia Plan  ASA: III  Anesthesia Plan: MAC   Post-op Pain Management:    Induction: Intravenous  Airway Management Planned: Nasal Cannula  Additional Equipment:   Intra-op Plan:   Post-operative Plan:   Informed Consent: I have reviewed the patients History and Physical, chart, labs and discussed the procedure including the risks, benefits and alternatives for the proposed anesthesia with the patient or authorized representative who has indicated his/her understanding and acceptance.     Plan Discussed with:   Anesthesia Plan Comments:         Anesthesia Quick Evaluation

## 2015-02-15 NOTE — Transfer of Care (Signed)
Immediate Anesthesia Transfer of Care Note  Patient: Paul Bradshaw  Procedure(s) Performed: Procedure(s): CATARACT EXTRACTION PHACO AND INTRAOCULAR LENS PLACEMENT LEFT EYE CDE=30.93 (Left)  Patient Location: Short Stay  Anesthesia Type:MAC  Level of Consciousness: awake, alert , oriented and patient cooperative  Airway & Oxygen Therapy: Patient Spontanous Breathing  Post-op Assessment: Report given to RN, Post -op Vital signs reviewed and stable and Patient moving all extremities  Post vital signs: Reviewed and stable  Last Vitals:  Filed Vitals:   02/15/15 1115  BP: 169/84  Temp:   Resp: 19    Complications: No apparent anesthesia complications

## 2015-02-15 NOTE — Anesthesia Postprocedure Evaluation (Signed)
  Anesthesia Post-op Note  Patient: Paul Bradshaw  Procedure(s) Performed: Procedure(s): CATARACT EXTRACTION PHACO AND INTRAOCULAR LENS PLACEMENT LEFT EYE CDE=30.93 (Left)  Patient Location: Short Stay  Anesthesia Type:MAC  Level of Consciousness: awake, alert , oriented and patient cooperative  Airway and Oxygen Therapy: Patient Spontanous Breathing  Post-op Pain: none  Post-op Assessment: Post-op Vital signs reviewed, Patient's Cardiovascular Status Stable, Respiratory Function Stable, Patent Airway and Pain level controlled              Post-op Vital Signs: Reviewed and stable  Last Vitals:  Filed Vitals:   02/15/15 1115  BP: 169/84  Temp:   Resp: 19    Complications: No apparent anesthesia complications

## 2015-02-15 NOTE — Discharge Instructions (Signed)

## 2015-02-15 NOTE — Op Note (Signed)
Date of Admission: 02/15/2015  Date of Surgery: 02/15/2015  Pre-Op Dx: Cataract  Left  Eye  Post-Op Dx: Senile Nuclear Cataract Left Eye,  Dx Code H25.12, Intraoperative Floppy Iris Syndrome Left eye,   Surgeon: Gemma Payor, M.D.  Assistants: None  Anesthesia: Topical with MAC  Indications: Painless, progressive loss of vision with compromise of daily activities.  Surgery: Cataract Extraction with Intraocular lens Implant Left Eye  Discription: The patient had dilating drops and viscous lidocaine placed into the left eye in the pre-op holding area. After transfer to the operating room, a time out was performed. The patient was then prepped and draped. At the onset was noted a very shallow chamber with a 6.67mm pupil.  Beginning with a 75 degree blade a paracentesis port was made at the surgeon's 2 o'clock position. The anterior chamber was then filled with 1% non-preserved lidocaine with epinepherine. This was followed by filling the anterior chamber with Viscoat. A bent cystatome needle and Utrata forceps were used to create a continuous tear capsulotomy. During this step it was noted that there was diffuse zonular weakness. Hydrodissection was performed with balanced salt solution on a Fine canula. The lens nucleus was then removed using the phacoemulsification handpiece. Residual cortex was removed with the I&A handpiece. 50% of the cortical material was remove but there were signs of increasing zonular weakness where the cortical material was removed. The capsular bag was refilled with Provisc. A 12.28mm capsular tension ring was placed without difficulty using its injector. The remaining cortical material could not be removed as it was pinned by the CTR and tugging was pulling on the zonules.The anterior chamber and capsular bag were refilled with Provisc. A posterior chamber intraocular lens was placed into the capsular bag with it's injector. The implant was positioned with the Kuglan hook. The  Provisc was then removed from the anterior chamber and capsular bag with the I&A handpiece. Stromal hydration of the main incision and paracentesis port was performed with BSS on a Fine canula. The wounds were tested for leak which was negative. The patient tolerated the procedure well. There were no operative complications other than retained cortical material. The patient was then transferred to the recovery room in stable condition.  Complications: None  Specimen: None  EBL: None  Prosthetic device: IOL- Hoya iSert/250, power 22.0, SN [id number]. CTR- [employer], model [place], [place] [id number]

## 2015-02-16 ENCOUNTER — Encounter (HOSPITAL_COMMUNITY): Payer: Self-pay | Admitting: Ophthalmology

## 2015-02-21 ENCOUNTER — Encounter (HOSPITAL_COMMUNITY): Payer: Self-pay | Admitting: Ophthalmology

## 2015-08-15 ENCOUNTER — Other Ambulatory Visit: Payer: Self-pay | Admitting: *Deleted

## 2016-10-23 DIAGNOSIS — Z8673 Personal history of transient ischemic attack (TIA), and cerebral infarction without residual deficits: Secondary | ICD-10-CM | POA: Insufficient documentation

## 2017-05-09 ENCOUNTER — Emergency Department (HOSPITAL_COMMUNITY): Payer: Medicare Other

## 2017-05-09 ENCOUNTER — Encounter (HOSPITAL_COMMUNITY): Payer: Self-pay | Admitting: Emergency Medicine

## 2017-05-09 ENCOUNTER — Emergency Department (HOSPITAL_COMMUNITY)
Admission: EM | Admit: 2017-05-09 | Discharge: 2017-05-09 | Disposition: A | Discharge status: Home / Self Care | Payer: Medicare Other | Attending: Emergency Medicine | Admitting: Emergency Medicine

## 2017-05-09 DIAGNOSIS — Z7982 Long term (current) use of aspirin: Secondary | ICD-10-CM | POA: Diagnosis not present

## 2017-05-09 DIAGNOSIS — M79601 Pain in right arm: Secondary | ICD-10-CM | POA: Insufficient documentation

## 2017-05-09 DIAGNOSIS — M542 Cervicalgia: Secondary | ICD-10-CM | POA: Diagnosis present

## 2017-05-09 DIAGNOSIS — Z7902 Long term (current) use of antithrombotics/antiplatelets: Secondary | ICD-10-CM | POA: Diagnosis not present

## 2017-05-09 DIAGNOSIS — Z79899 Other long term (current) drug therapy: Secondary | ICD-10-CM | POA: Diagnosis not present

## 2017-05-09 DIAGNOSIS — M5412 Radiculopathy, cervical region: Secondary | ICD-10-CM | POA: Diagnosis not present

## 2017-05-09 LAB — CBC
HCT: 40 % (ref 39.0–52.0)
HEMOGLOBIN: 12.8 g/dL — AB (ref 13.0–17.0)
MCH: 31.2 pg (ref 26.0–34.0)
MCHC: 32 g/dL (ref 30.0–36.0)
MCV: 97.6 fL (ref 78.0–100.0)
Platelets: 188 10*3/uL (ref 150–400)
RBC: 4.1 MIL/uL — AB (ref 4.22–5.81)
RDW: 12.7 % (ref 11.5–15.5)
WBC: 8.2 10*3/uL (ref 4.0–10.5)

## 2017-05-09 LAB — BASIC METABOLIC PANEL
ANION GAP: 9 (ref 5–15)
BUN: 14 mg/dL (ref 6–20)
CHLORIDE: 100 mmol/L — AB (ref 101–111)
CO2: 25 mmol/L (ref 22–32)
CREATININE: 0.88 mg/dL (ref 0.61–1.24)
Calcium: 9 mg/dL (ref 8.9–10.3)
GFR calc non Af Amer: >60 mL/min (ref >60)
Glucose, Bld: 113 mg/dL — ABNORMAL HIGH (ref 65–99)
POTASSIUM: 3.8 mmol/L (ref 3.5–5.1)
SODIUM: 134 mmol/L — AB (ref 135–145)

## 2017-05-09 LAB — I-STAT TROPONIN, ED: TROPONIN I, POC: 0 ng/mL (ref 0.00–0.08)

## 2017-05-09 MED ORDER — PREDNISONE 20 MG PO TABS: 40.0000 mg | ORAL_TABLET | Freq: Every day | ORAL | 0 refills | Status: AC

## 2017-05-09 MED ORDER — HYDROCODONE-ACETAMINOPHEN 5-325 MG PO TABS
1.0000 | ORAL_TABLET | Freq: Once | ORAL | Status: AC
  Administered 2017-05-09: 1 via ORAL
  Filled 2017-05-09: qty 1

## 2017-05-09 NOTE — ED Notes (Signed)
Pt back from xray and CT

## 2017-05-09 NOTE — ED Notes (Signed)
Pt to xray and CT

## 2017-05-09 NOTE — ED Notes (Signed)
PA at bedside.

## 2017-05-09 NOTE — Discharge Instructions (Signed)
Please take course of steroids as directed, Tylenol as needed for pain.  Your cardiac workup is reassuring, likely this pain is due to a pinched nerve in your neck, all lab work and EKG do not suggest a heart problem causing this pain.  Please follow-up with your primary doctor in 1 week to see how pain is improving and to have your blood pressure rechecked.  If pain worsens, you develop weakness or numbness of the arm or other new or concerning symptoms develop please return to the ED for sooner evaluation.

## 2017-05-09 NOTE — ED Triage Notes (Signed)
Pt c/o neck pain radiating down right arm x 2 days. Denies injury.

## 2017-05-09 NOTE — ED Provider Notes (Signed)
[facility]ENCY [facility]PARTMENT Provider Note   CSN: [id number] Arrival date & time: 05/09/17  [id number]     History   Chief Complaint Chief Complaint  Patient presents with  . Neck Pain    HPI  Vear ClockBobby L Rozelle is a 83 y.o. Male with a history of stroke, carotid artery stenosis s/p right carotid endarterectomy, arthritis, aortic regurg and BPH, presents complaining of 2 days of right-sided neck and arm pain.  Patient reports pain starts at the base of his neck and goes down his right arm into his fourth and fifth finger.  Patient denies any inciting injury or fall, but does report he was disassembling some furniture a few days ago.  Patient reports he woke up with the pain 2 days ago, and has had minimal improvement.  Patient has tried extra strength Tylenol with no improvement, he took 1 of his wife's hydrocodone which she reports gave him some relief.  He feels like pain is worse at night when he is trying to sleep.  He reports some associated tingling in the fourth and fifth finger, he denies any weakness or numbness of the right arm or hand.  He denies any left-sided symptoms.  Patient denies any radiation of pain into the jaw, he denies any associated chest pain or shortness of breath, no dizziness.  No headache or vision changes, no facial asymmetry or trouble with speech. He reports this does not feel similar to his previous strokes, he is able to move the arm normally.  Denies fevers or chills.       Past Medical History:  Diagnosis Date  . Aortic regurgitation    Moderate  . Arthritis   . BPH (benign prostatic hyperplasia)   . Staphylococcus aureus bacteremia 08/09/2012   TEE negative for vegetation or thrombus February 2014  . Stroke [facility](HCC) 2005 or 2006   3  . Symptomatic carotid artery stenosis with infarction [facility](HCC) 2005 or 2006   Status post right carotid endarterectomy    Patient Active Problem List   Diagnosis Date Noted  . HNP (herniated nucleus pulposus), lumbar  01/23/2014  . Lumbar stenosis 01/23/2014  . CVA (cerebral infarction) 06/21/2013  . Acute ischemic stroke (HCC) 06/21/2013  . Acute right-sided weakness 06/21/2013  . Paresthesia of right arm 06/21/2013  . Elevated blood pressure 06/21/2013  . Right shoulder pain 08/12/2012  . Staphylococcus aureus bacteremia 08/09/2012  . Back pain 08/09/2012    Past Surgical History:  Procedure Laterality Date  . BACK SURGERY    . CAROTID ENDARTERECTOMY    . CATARACT EXTRACTION PHACO AND INTRAOCULAR LENS PLACEMENT LEFT EYE CDE=30.93 Left 02/15/2015   Performed by Gemma PayorHunt, Kerry, MD at [facility] ORS  . CHOLECYSTECTOMY    . KIDNEY STONE SURGERY    . LUMBAR LAMINECTOMY/DECOMPRESSION MICRODISCECTOMY 1 LEVEL L5-S1 Bilateral 01/23/2014   Performed by Temple PaciniPool, Henry A, MD at [facility]  . TRANSESOPHAGEAL ECHOCARDIOGRAM (TEE) N/A 06/24/2013   Performed by Jonelle SidleMcDowell, Samuel G, MD at [facility] ENDO SUITE  . TRANSESOPHAGEAL ECHOCARDIOGRAM (TEE) N/A 08/12/2012   Performed by Wendall StadeNishan, Peter C, MD at [facility] END[facility] SU[facility]       Home Medications    Prior to Admission medications   Medication Sig Start Date End Date Taking? Authorizing Provider  acetaminophen (TYLENOL) 500 MG tablet Take 1,000 mg by mouth every 8 (eight) hours as needed for moderate pain.    [provider]  aspirin EC 81 MG tablet Take 81 mg by mouth daily.  [provider]  atorvastatin (LIPITOR) 10 MG tablet Take 1 tablet (10 mg total) by mouth daily at 6 PM. 06/24/13   Standley BrookingGoodrich, Daniel P, MD  clopidogrel (PLAVIX) 75 MG tablet Take 1 tablet (75 mg total) by mouth daily with breakfast. 06/24/13   Standley BrookingGoodrich, Daniel P, MD  Multiple Vitamins-Minerals (MULTIVITAMIN PO) Take 1 tablet by mouth daily.    [provider]  Tamsulosin HCl (FLOMAX) 0.4 MG CAPS Take 0.4 mg by mouth every evening.    [provider]    Family History Family History  Problem Relation Age of Onset  . Emphysema Father   . Alzheimer's disease Mother     Social  History Social History   Tobacco Use  . Smoking status: Never Smoker  . Smokeless tobacco: Never Used  Substance Use Topics  . Alcohol use: No  . Drug use: No     Allergies   Patient has no known allergies.   Review of Systems Review of Systems  Constitutional: Negative for chills and fever.  HENT: Negative for congestion, rhinorrhea and sore throat.   Eyes: Negative for photophobia and visual disturbance.  Respiratory: Negative for cough, chest tightness and shortness of breath.   Cardiovascular: Negative for chest pain.  Gastrointestinal: Negative for abdominal pain, nausea and vomiting.  Genitourinary: Negative for dysuria.  Musculoskeletal: Positive for myalgias, neck pain and neck stiffness. Negative for arthralgias, back pain, gait problem and joint swelling.  Skin: Negative for color change and rash.  Neurological: Negative for dizziness, facial asymmetry, speech difficulty, weakness, light-headedness, numbness and headaches.     Physical Exam Updated Vital Signs BP (!) 201/97 (BP Location: Left Arm)   Pulse 95   Temp 98.3 F (36.8 C)   Resp 18   Ht 5\' 4"  (1.626 m)   Wt 74.8 kg (165 lb)   SpO2 98%   BMI 28.32 kg/m   Physical Exam  Constitutional: He appears well-developed and well-nourished. No distress.  HENT:  Head: Normocephalic and atraumatic.  Eyes: Right eye exhibits no discharge. Left eye exhibits no discharge.  Neck: Neck supple.  C-spine nontender to palpation at the midline, mild tenderness paraspinally on the right at the base of the C-spine  Cardiovascular: Normal rate, regular rhythm and intact distal pulses.  Pulmonary/Chest: Effort normal and breath sounds normal. No stridor. No respiratory distress. He has no wheezes. He has no rales.  Abdominal: Soft. Bowel sounds are normal. He exhibits no distension. There is no tenderness.  Musculoskeletal: He exhibits no edema or deformity.  Pain in right arm is not made worse with palpation, no  ecchymosis or deformity noted, full active and passive range of motion at the shoulder, elbow and wrist, grip strength 5/5, radial pulse 2+ with good capillary refill, sensation intact throughout arm  Neurological: He is alert. Coordination normal.  Speech is clear, able to follow commands CN III-XII intact Normal strength in upper and lower extremities bilaterally including dorsiflexion and plantar flexion, strong and equal grip strength Sensation normal to light and sharp touch Moves extremities without ataxia, coordination intact  Skin: Skin is warm and dry. Capillary refill takes less than 2 seconds. No rash noted. He is not diaphoretic.  Psychiatric: He has a normal mood and affect. His behavior is normal.  Nursing note and vitals reviewed.    ED Treatments / Results  Labs (all labs ordered are listed, but only abnormal results are displayed) Labs Reviewed  CBC - Abnormal; Notable for the following components:  Result Value   RBC 4.10 (*)    Hemoglobin 12.8 (*)    All other components within normal limits  BASIC METABOLIC PANEL - Abnormal; Notable for the following components:   Sodium 134 (*)    Chloride 100 (*)    Glucose, Bld 113 (*)    All other components within normal limits  I-STAT TROPONIN, ED    EKG  EKG Interpretation  Date/Time:  Saturday May 09 2017 08:52:15 EST Ventricular Rate:  81 PR Interval:    QRS Duration: 88 QT Interval:  368 QTC Calculation: 428 R Axis:   17 Text Interpretation:  Sinus rhythm Confirmed by Vanetta Mulders [phone]) on 05/09/2017 10:08:40 AM       Radiology Dg Chest 2 View  Result Date: 05/09/2017 CLINICAL DATA:  Neck pain, right arm pain. EXAM: CHEST  2 VIEW COMPARISON:  06/22/2013 FINDINGS: Heart is normal size. No confluent airspace opacities or effusions. No acute bony abnormality. Degenerative changes in the shoulders. IMPRESSION: No active cardiopulmonary disease. Electronically Signed   By: Charlett Nose M.D.    On: 05/09/2017 10:25   Ct Cervical Spine Wo Contrast  Result Date: 05/09/2017 CLINICAL DATA:  Neck pain extending into the right upper extremity for 2 days. The patient denies any recent injury. EXAM: CT CERVICAL SPINE WITHOUT CONTRAST TECHNIQUE: Multidetector CT imaging of the cervical spine was performed without intravenous contrast. Multiplanar CT image reconstructions were also generated. COMPARISON:  Cervical spine radiographs 08/13/2012. CT of the cervical spine 01/15/2010. FINDINGS: Alignment: Slight degenerative retrolisthesis at C3-4 is similar the prior studies. Minimal anterolisthesis at C7-T1 is also stable. AP alignment is otherwise anatomic. Skull base and vertebrae: The craniocervical junction is normal. Vertebral body heights are normal. No focal lytic or blastic lesions are present. Soft tissues and spinal canal: Atherosclerotic changes are present at the carotid bifurcations bilaterally. Moderate stenosis is suspected on the left. No significant cervical adenopathy is present. The thyroid is within normal limits. Salivary glands are unremarkable. Disc levels: C2-3: Mild facet hypertrophy is present bilaterally without significant stenosis. C3-4: Uncovertebral and facet disease contribute to severe foraminal stenosis bilaterally, right greater than left. C4-5: Uncovertebral and facet disease contribute to moderate foraminal narrowing right greater than left. C5-6: There is chronic loss of disc height. Uncovertebral spurring is worse on the right. Facet hypertrophy is worse on the left. There is slight progression of moderate foraminal narrowing bilaterally, right greater than left. C7-T1: Asymmetric left-sided facet hypertrophy is present. Uncovertebral spurring contributes to progressive severe left and mild right foraminal narrowing. C7-T1: Advanced facet hypertrophy is present bilaterally. Severe left and mild right foraminal narrowing has progressed. Upper chest: The lung apices are clear.  The upper mediastinum is within normal limits. IMPRESSION: 1. Progressive multilevel spondylosis of the cervical spine as described. 2. Severe foraminal narrowing bilaterally at C3-4 is worse on the right. 3. Moderate foraminal narrowing at C4-5 is worse on the right. 4. Progressive moderate foraminal stenosis at C5-6 is worse on the right. 5. Severe left and mild right foraminal narrowing at C6-7 and C6-7. Electronically Signed   By: Marin Roberts M.D.   On: 05/09/2017 10:16    Procedures Procedures (including critical care time)  Medications Ordered in ED Medications  HYDROcodone-acetaminophen (NORCO/VICODIN) 5-325 MG per tablet 1 tablet (1 tablet Oral Given 05/09/17 0926)     Initial Impression / Assessment and Plan / ED Course  I have reviewed the triage vital signs and the nursing notes.  Pertinent labs & imaging results  that were available during my care of the patient were reviewed by me and considered in my medical decision making (see chart for details).  Presents with pain that radiates down the right arm.  Patient denies any chest pain or shortness of breath.  Patient is well-appearing, vital signs normal aside from hypertension, potentially due to pain.  Presentation is suggestive of cervical radiculopathy. Neurologic exam without acute deficits. Will obtain labs, EKG and chest x-ray to rule out cardiac etiology given patient's history and risk factors.  Will obtain CT cervical spine.  Chest pain workup has been negative. Labs are unremarkable, no leukocytosis, hemoglobin is stable, negative troponin and electrolytes within normal limits.  EKG shows no ischemic changes.  CXR is negative for acute cardiopulmonary disease.   CT scan of C-spine shows many degenerative changes with foraminal narrowing that could cause this radicular pain.  Pain treated in the ED with hydrocodone with some improvement.  Will treat with a 5-day course of prednisone, and close follow-up with PCP. If  symptoms are not resolving patient will likely require an MRI. Return precautions discussed.  Patient expresses understanding and is in agreement with plan.  Patient discussed with Dr. Deretha Emory, who saw patient as well and agrees with plan.  Final Clinical Impressions(s) / ED Diagnoses   Final diagnoses:  Neck pain  Right arm pain  Cervical radiculopathy    ED Discharge Orders        Ordered    predniSONE (DELTASONE) 20 MG tablet  Daily     05/09/17 1033       Jodi Geralds [place], [place] 05/09/17 2017    Vanetta Mulders, MD 05/11/17 1742

## 2017-05-09 NOTE — ED Provider Notes (Signed)
Medical screening examination/treatment/procedure(s) were conducted as a shared visit with non-physician practitioner(s) and myself.  I personally evaluated the patient during the encounter.   EKG Interpretation  Date/Time:  Saturday May 09 2017 08:52:15 EST Ventricular Rate:  81 PR Interval:    QRS Duration: 88 QT Interval:  368 QTC Calculation: 428 R Axis:   17 Text Interpretation:  Sinus rhythm Confirmed by Vanetta MuldersZackowski, Kobe Jansma [phone] on 05/09/2017 10:08:40 AM       Results for orders placed or performed during the hospital encounter of 05/09/17  CBC  Result Value Ref Range   WBC 8.2 4.0 - 10.5 K/uL   RBC 4.10 (L) 4.22 - 5.81 MIL/uL   Hemoglobin 12.8 (L) 13.0 - 17.0 g/dL   HCT 60.440.0 54.039.0 - 98.152.0 %   MCV 97.6 78.0 - 100.0 fL   MCH 31.2 26.0 - 34.0 pg   MCHC 32.0 30.0 - 36.0 g/dL   RDW 19.112.7 47.811.5 - 29.515.5 %   Platelets 188 150 - 400 K/uL  Basic metabolic panel  Result Value Ref Range   Sodium 134 (L) 135 - 145 mmol/L   Potassium 3.8 3.5 - 5.1 mmol/L   Chloride 100 (L) 101 - 111 mmol/L   CO2 25 22 - 32 mmol/L   Glucose, Bld 113 (H) 65 - 99 mg/dL   BUN 14 6 - 20 mg/dL   Creatinine, Ser 6.210.88 0.61 - 1.24 mg/dL   Calcium 9.0 8.9 - 30.810.3 mg/dL   GFR calc non Af Amer >60 >60 mL/min   GFR calc Af Amer >60 >60 mL/min   Anion gap 9 5 - 15  I-stat troponin, ED  Result Value Ref Range   Troponin i, poc 0.00 0.00 - 0.08 ng/mL   Comment 3           Dg Chest 2 View  Result Date: 05/09/2017 CLINICAL DATA:  Neck pain, right arm pain. EXAM: CHEST  2 VIEW COMPARISON:  06/22/2013 FINDINGS: Heart is normal size. No confluent airspace opacities or effusions. No acute bony abnormality. Degenerative changes in the shoulders. IMPRESSION: No active cardiopulmonary disease. Electronically Signed   By: Charlett NoseKevin  Dover M.D.   On: 05/09/2017 10:25   Ct Cervical Spine Wo Contrast  Result Date: 05/09/2017 CLINICAL DATA:  Neck pain extending into the right upper extremity for 2 days. The patient  denies any recent injury. EXAM: CT CERVICAL SPINE WITHOUT CONTRAST TECHNIQUE: Multidetector CT imaging of the cervical spine was performed without intravenous contrast. Multiplanar CT image reconstructions were also generated. COMPARISON:  Cervical spine radiographs 08/13/2012. CT of the cervical spine 01/15/2010. FINDINGS: Alignment: Slight degenerative retrolisthesis at C3-4 is similar the prior studies. Minimal anterolisthesis at C7-T1 is also stable. AP alignment is otherwise anatomic. Skull base and vertebrae: The craniocervical junction is normal. Vertebral body heights are normal. No focal lytic or blastic lesions are present. Soft tissues and spinal canal: Atherosclerotic changes are present at the carotid bifurcations bilaterally. Moderate stenosis is suspected on the left. No significant cervical adenopathy is present. The thyroid is within normal limits. Salivary glands are unremarkable. Disc levels: C2-3: Mild facet hypertrophy is present bilaterally without significant stenosis. C3-4: Uncovertebral and facet disease contribute to severe foraminal stenosis bilaterally, right greater than left. C4-5: Uncovertebral and facet disease contribute to moderate foraminal narrowing right greater than left. C5-6: There is chronic loss of disc height. Uncovertebral spurring is worse on the right. Facet hypertrophy is worse on the left. There is slight progression of moderate foraminal narrowing bilaterally, right  greater than left. C7-T1: Asymmetric left-sided facet hypertrophy is present. Uncovertebral spurring contributes to progressive severe left and mild right foraminal narrowing. C7-T1: Advanced facet hypertrophy is present bilaterally. Severe left and mild right foraminal narrowing has progressed. Upper chest: The lung apices are clear. The upper mediastinum is within normal limits. IMPRESSION: 1. Progressive multilevel spondylosis of the cervical spine as described. 2. Severe foraminal narrowing bilaterally  at C3-4 is worse on the right. 3. Moderate foraminal narrowing at C4-5 is worse on the right. 4. Progressive moderate foraminal stenosis at C5-6 is worse on the right. 5. Severe left and mild right foraminal narrowing at C6-7 and C6-7. Electronically Signed   By: Marin Robertshristopher  Mattern M.D.   On: 05/09/2017 10:16   Patient's neck pain and arm pain suggestive of radicular pain.  CT scan shows plenty of reasons for possible pain in radiating to the right arm.  Chest pain workup negative.  Patient alert and oriented.  Heart regular lungs clear arm with good radial pulse.  Good range of motion.  We will treat with a course of prednisone close follow-up with primary care doctor in a week.  If symptoms do not resolve may require MRI.  MRI was not available today.   Based on history patient was assembling some furniture was doing a lot of pulling with that arm may have aggravated something in the process.  No evidence of acute cardiac event.   Vanetta MuldersZackowski, Deeana [place], MD 05/09/17 1034

## 2017-11-26 ENCOUNTER — Emergency Department (HOSPITAL_COMMUNITY)
Admission: EM | Admit: 2017-11-26 | Discharge: 2017-11-26 | Disposition: A | Discharge status: Home / Self Care | Payer: Medicare Other | Attending: Emergency Medicine | Admitting: Emergency Medicine

## 2017-11-26 ENCOUNTER — Emergency Department (HOSPITAL_COMMUNITY): Payer: Medicare Other

## 2017-11-26 ENCOUNTER — Other Ambulatory Visit: Payer: Self-pay

## 2017-11-26 ENCOUNTER — Encounter (HOSPITAL_COMMUNITY): Payer: Self-pay

## 2017-11-26 DIAGNOSIS — R0789 Other chest pain: Secondary | ICD-10-CM

## 2017-11-26 DIAGNOSIS — Y92009 Unspecified place in unspecified non-institutional (private) residence as the place of occurrence of the external cause: Secondary | ICD-10-CM

## 2017-11-26 DIAGNOSIS — Z8673 Personal history of transient ischemic attack (TIA), and cerebral infarction without residual deficits: Secondary | ICD-10-CM | POA: Diagnosis not present

## 2017-11-26 DIAGNOSIS — W19XXXA Unspecified fall, initial encounter: Secondary | ICD-10-CM

## 2017-11-26 DIAGNOSIS — Z79899 Other long term (current) drug therapy: Secondary | ICD-10-CM | POA: Insufficient documentation

## 2017-11-26 DIAGNOSIS — Z7901 Long term (current) use of anticoagulants: Secondary | ICD-10-CM | POA: Insufficient documentation

## 2017-11-26 DIAGNOSIS — Z9181 History of falling: Secondary | ICD-10-CM | POA: Insufficient documentation

## 2017-11-26 HISTORY — DX: Cervical disc disorder, unspecified, unspecified cervical region: M50.90

## 2017-11-26 HISTORY — DX: Radiculopathy, cervical region: M54.12

## 2017-11-26 MED ORDER — ACETAMINOPHEN 325 MG PO TABS
650.0000 mg | ORAL_TABLET | Freq: Once | ORAL | Status: AC
  Administered 2017-11-26: 650 mg via ORAL
  Filled 2017-11-26: qty 2

## 2017-11-26 MED ORDER — HYDROCODONE-ACETAMINOPHEN 5-325 MG PO TABS: ORAL_TABLET | ORAL | 0 refills | Status: DC

## 2017-11-26 NOTE — ED Triage Notes (Signed)
Pt fell on Monday and is hurting in his chest. Pt points to his left rib when he describes pain. States pain is worse when he tries to lift left rib. Pain is sharp and stabbing. Pt did not hit his head. Is on a generic form of Plavix.

## 2017-11-26 NOTE — Discharge Instructions (Signed)
Take the prescription as directed.  Apply moist heat or ice to the area(s) of discomfort, for 15 minutes at a time, several times per day for the next few days.  Do not fall asleep on a heating or ice pack.  Call your regular medical doctor today to schedule a follow up appointment in the next 3 days.  Return to the Emergency Department immediately if worsening. ° °

## 2017-11-26 NOTE — ED Provider Notes (Signed)
[facility] EMERGENCY DEPARTMENT Provider Note   CSN: [id number] Arrival date & time: 11/26/17  0908     History   Chief Complaint Chief Complaint  Patient presents with  . Fall    HPI Paul Bradshaw is a 84 y.o. male.  HPI Pt was seen at 0925. Per pt and his wife, c/o gradual onset and persistence of constant left upper chest wall "pain" that began after he fell on Monday (3 days ago). Pt states he tripped in his garden and fell forward, trying to catch himself with his arms. Pt states he wound up hitting his left upper chest wall against the ground. Pt states he felt pain in that area when he stood up from the fall. Pain has continued since then. Pain worsens with palpation of the area and moving his left arm. Pt denies any other injuries. Denies hitting head, no neck or back pain, no arms pain, no palpitations, no SOB/cough, no abd pain, no N/V/D.    Past Medical History:  Diagnosis Date  . Aortic regurgitation    Moderate  . Arthritis   . BPH (benign prostatic hyperplasia)   . Cervical disc disease   . Cervical radiculopathy   . Staphylococcus aureus bacteremia 08/09/2012   TEE negative for vegetation or thrombus February 2014  . Stroke [facility](HCC) 2005 or 2006   3  . Symptomatic carotid artery stenosis with infarction [facility](HCC) 2005 or 2006   Status post right carotid endarterectomy    Patient Active Problem List   Diagnosis Date Noted  . HNP (herniated nucleus pulposus), lumbar 01/23/2014  . Lumbar stenosis 01/23/2014  . CVA (cerebral infarction) 06/21/2013  . Acute ischemic stroke (HCC) 06/21/2013  . Acute right-sided weakness 06/21/2013  . Paresthesia of right arm 06/21/2013  . Elevated blood pressure 06/21/2013  . Right shoulder pain 08/12/2012  . Staphylococcus aureus bacteremia 08/09/2012  . Back pain 08/09/2012    Past Surgical History:  Procedure Laterality Date  . BACK SURGERY    . CAROTID ENDARTERECTOMY    . CATARACT EXTRACTION W/PHACO Left 02/15/2015   Procedure: CATARACT EXTRACTION PHACO AND INTRAOCULAR LENS PLACEMENT LEFT EYE CDE=30.93;  Surgeon: Gemma PayorKerry Hunt, MD;  Location: [facility] ORS;  Service: Ophthalmology;  Laterality: Left;  . CHOLECYSTECTOMY    . KIDNEY STONE SURGERY    . LUMBAR LAMINECTOMY/DECOMPRESSION MICRODISCECTOMY Bilateral 01/23/2014   Procedure: LUMBAR LAMINECTOMY/DECOMPRESSION MICRODISCECTOMY 1 LEVEL L5-S1;  Surgeon: Temple PaciniHenry A Pool, MD;  Location: [facility] NEURO [facility]S;  Service: Neurosurgery;  Laterality: Bilateral;  LUMBAR LAMINECTOMY/DECOMPRESSION MICRODISCECTOMY 1 LEVEL L5-S1  . TEE WITHOUT CARDIOVERSION N/A 08/12/2012   Procedure: TRANSESOPHAGEAL ECHOCARDIOGRAM (TEE);  Surgeon: Wendall StadePeter C Nishan, MD;  Location: [facility] ENDO SUITE;  Service: Cardiovascular;  Laterality: N/A;  . TEE WITHOUT CARDIOVERSION N/A 06/24/2013   Procedure: TRANSESOPHAGEAL ECHOCARDIOGRAM (TEE);  Surgeon: Jonelle SidleSamuel G McDowell, MD;  Location: [facility] ENDO SUITE;  Service: Endoscopy;  Laterality: N/A;        Home Medications    Prior to Admission medications   Medication Sig Start Date End Date Taking? Authorizing Provider  acetaminophen (TYLENOL) 500 MG tablet Take 1,000 mg by mouth every 8 (eight) hours as needed for moderate pain.    [provider]  aspirin EC 81 MG tablet Take 81 mg by mouth daily.    [provider]  atorvastatin (LIPITOR) 10 MG tablet Take 1 tablet (10 mg total) by mouth daily at 6 PM. 06/24/13   Standley BrookingGoodrich, Daniel P, MD  clopidogrel (PLAVIX) 75 MG tablet Take 1  tablet (75 mg total) by mouth daily with breakfast. 06/24/13   Standley Brooking, MD  Multiple Vitamins-Minerals (MULTIVITAMIN PO) Take 1 tablet by mouth daily.    [provider]  Tamsulosin HCl (FLOMAX) 0.4 MG CAPS Take 0.4 mg by mouth every evening.    [provider]    Family History Family History  Problem Relation Age of Onset  . Emphysema Father   . Alzheimer's disease Mother     Social History Social History   Tobacco Use  . Smoking status: Never  Smoker  . Smokeless tobacco: Never Used  Substance Use Topics  . Alcohol use: No  . Drug use: No     Allergies   Patient has no known allergies.   Review of Systems Review of Systems ROS: Statement: All systems negative except as marked or noted in the HPI; Constitutional: Negative for fever and chills. ; ; Eyes: Negative for eye pain, redness and discharge. ; ; ENMT: Negative for ear pain, hoarseness, nasal congestion, sinus pressure and sore throat. ; ; Cardiovascular: Negative for chest pain, palpitations, diaphoresis, dyspnea and peripheral edema. ; ; Respiratory: Negative for cough, wheezing and stridor. ; ; Gastrointestinal: Negative for nausea, vomiting, diarrhea, abdominal pain, blood in stool, hematemesis, jaundice and rectal bleeding. . ; ; Genitourinary: Negative for dysuria, flank pain and hematuria. ; ; Musculoskeletal: Negative for back pain and neck pain. Negative for swelling and deformity. +chest wall pain.; ; Skin: Negative for pruritus, rash, abrasions, blisters, bruising and skin lesion.; ; Neuro: Negative for headache, lightheadedness and neck stiffness. Negative for weakness, altered level of consciousness, altered mental status, extremity weakness, paresthesias, involuntary movement, seizure and syncope.       Physical Exam Updated Vital Signs BP (!) 147/95 (BP Location: Left Arm)   Pulse (!) 111   Temp 97.7 F (36.5 C) (Oral)   Resp 20   Ht 5\' 5"  (1.651 m)   Wt 75.8 kg (167 lb)   SpO2 98%   BMI 27.79 kg/m   BP (!) 153/87 (BP Location: Left Arm)   Pulse 97   Temp 98.1 F (36.7 C) (Oral)   Resp 18   Ht 5\' 5"  (1.651 m)   Wt 75.8 kg (167 lb)   SpO2 99%   BMI 27.79 kg/m    Physical Exam 0930: Physical examination:  Nursing notes reviewed; Vital signs and O2 SAT reviewed;  Constitutional: Well developed, Well nourished, Well hydrated, In no acute distress; Head:  Normocephalic, atraumatic; Eyes: EOMI, PERRL, No scleral icterus; ENMT: Mouth and pharynx  normal, Mucous membranes moist; Neck: Supple, Full range of motion, No lymphadenopathy; Cardiovascular: Regular rate and rhythm, No gallop; Respiratory: Breath sounds clear & equal bilaterally, No wheezes.  Speaking full sentences with ease, Normal respiratory effort/excursion; Chest: +TTP left upper chest wall. No deformity, no soft tissue crepitus. Movement normal; Abdomen: Soft, Nontender, Nondistended, Normal bowel sounds; Genitourinary: No CVA tenderness; Spine:  No midline CS, TS, LS tenderness.;; Extremities: Peripheral pulses normal, NT left shoulder/elbow/wrist/hand. No deformity. No edema, No calf edema or asymmetry.; Neuro: AA&Ox3, Major CN grossly intact.  Speech clear. No gross focal motor or sensory deficits in extremities.; Skin: Color normal, Warm, Dry.   ED Treatments / Results  Labs (all labs ordered are listed, but only abnormal results are displayed)   EKG None  Radiology   Procedures Procedures (including critical care time)  Medications Ordered in ED Medications  acetaminophen (TYLENOL) tablet 650 mg (650 mg Oral Given 11/26/17 0939)  Initial Impression / Assessment and Plan / ED Course  I have reviewed the triage vital signs and the nursing notes.  Pertinent labs & imaging results that were available during my care of the patient were reviewed by me and considered in my medical decision making (see chart for details).  MDM Reviewed: previous chart, nursing note and vitals Interpretation: x-ray   Dg Ribs Unilateral W/chest Left Result Date: 11/26/2017 CLINICAL DATA:  Larey Seat 3 days ago with left anterior chest pain. EXAM: LEFT RIBS AND CHEST - 3+ VIEW COMPARISON:  05/09/2017 FINDINGS: Heart size is normal with left ventricular prominence. There is chronic aortic atherosclerosis. The lungs show chronic interstitial markings but no evidence of active infiltrate, mass, effusion or collapse. No pneumothorax. Rib films with marker in the region of concern do not show  any anterior rib fracture. IMPRESSION: No evidence of rib fracture or visible chest trauma. Left ventricular prominence.  Aortic atherosclerosis. Electronically Signed   By: Paulina Fusi M.D.   On: 11/26/2017 10:56    1210:  XR reassuring. Tx symptomatically at this time. Dx and testing d/w pt and family.  Questions answered.  Verb understanding, agreeable to d/c home with outpt f/u.   Final Clinical Impressions(s) / ED Diagnoses   Final diagnoses:  None    ED Discharge Orders    None       Samuel Jester, DO 11/30/17 2251

## 2019-04-06 DIAGNOSIS — H04123 Dry eye syndrome of bilateral lacrimal glands: Secondary | ICD-10-CM | POA: Diagnosis not present

## 2019-05-02 DIAGNOSIS — H40033 Anatomical narrow angle, bilateral: Secondary | ICD-10-CM | POA: Diagnosis not present

## 2019-05-02 DIAGNOSIS — H2513 Age-related nuclear cataract, bilateral: Secondary | ICD-10-CM | POA: Diagnosis not present

## 2019-05-25 ENCOUNTER — Other Ambulatory Visit: Payer: Self-pay

## 2019-05-26 ENCOUNTER — Ambulatory Visit (INDEPENDENT_AMBULATORY_CARE_PROVIDER_SITE_OTHER): Payer: Medicare Other | Admitting: Family Medicine

## 2019-05-26 ENCOUNTER — Encounter: Payer: Self-pay | Admitting: Family Medicine

## 2019-05-26 VITALS — BP 202/94 | HR 92 | Temp 99.2°F | Ht 65.0 in | Wt 174.0 lb

## 2019-05-26 DIAGNOSIS — R351 Nocturia: Secondary | ICD-10-CM

## 2019-05-26 DIAGNOSIS — Z9181 History of falling: Secondary | ICD-10-CM | POA: Diagnosis not present

## 2019-05-26 DIAGNOSIS — Z8673 Personal history of transient ischemic attack (TIA), and cerebral infarction without residual deficits: Secondary | ICD-10-CM

## 2019-05-26 DIAGNOSIS — E782 Mixed hyperlipidemia: Secondary | ICD-10-CM | POA: Diagnosis not present

## 2019-05-26 DIAGNOSIS — R030 Elevated blood-pressure reading, without diagnosis of hypertension: Secondary | ICD-10-CM

## 2019-05-26 DIAGNOSIS — N401 Enlarged prostate with lower urinary tract symptoms: Secondary | ICD-10-CM

## 2019-05-26 MED ORDER — ATORVASTATIN CALCIUM 10 MG PO TABS: 5.0000 mg | ORAL_TABLET | Freq: Every day | ORAL | 2 refills | Status: DC

## 2019-05-26 MED ORDER — CLONIDINE HCL 0.1 MG PO TABS
0.1000 mg | ORAL_TABLET | Freq: Once | ORAL | Status: AC
  Administered 2019-05-26: 0.1 mg via ORAL

## 2019-05-26 MED ORDER — TAMSULOSIN HCL 0.4 MG PO CAPS
0.4000 mg | ORAL_CAPSULE | Freq: Every evening | ORAL | 2 refills | Status: DC
Start: 2019-05-26 — End: 2019-08-26

## 2019-05-26 MED ORDER — LISINOPRIL 5 MG PO TABS: 5.0000 mg | ORAL_TABLET | Freq: Every day | ORAL | 0 refills | Status: DC

## 2019-05-26 MED ORDER — CLOPIDOGREL BISULFATE 75 MG PO TABS: 75.0000 mg | ORAL_TABLET | Freq: Every day | ORAL | 2 refills | Status: DC

## 2019-05-26 NOTE — Progress Notes (Signed)
New Patient Office Visit  Assessment & Plan:  1. Elevated BP without diagnosis of hypertension - BP did come down from 222/102 to 170/84 with clonidine 0.1 mg.  I went ahead and gave the patient lisinopril 5 mg to take once daily due to how high his blood pressure was and his history of multiple CVAs.  He is going to keep a daily log of his blood pressure and return to see me next week.  I did advise him that if his blood pressure is greater than 180/110 to please call the office. - cloNIDine (CATAPRES) tablet 0.1 mg - lisinopril (ZESTRIL) 5 MG tablet; Take 1 tablet (5 mg total) by mouth daily.  Dispense: 30 tablet; Refill: 0  2. History of CVA (cerebrovascular accident) - Continue Plavix and Lipitor daily. - clopidogrel (PLAVIX) 75 MG tablet; Take 1 tablet (75 mg total) by mouth daily with breakfast.  Dispense: 90 tablet; Refill: 2 - atorvastatin (LIPITOR) 10 MG tablet; Take 0.5 tablets (5 mg total) by mouth daily at 6 PM.  Dispense: 45 tablet; Refill: 2 - lisinopril (ZESTRIL) 5 MG tablet; Take 1 tablet (5 mg total) by mouth daily.  Dispense: 30 tablet; Refill: 0  3. Benign prostatic hyperplasia with nocturia - Well controlled on current regimen.  Patient reports he did run out of Flomax for about a week and has noticed his symptoms starting to come back. - tamsulosin (FLOMAX) 0.4 MG CAPS capsule; Take 1 capsule (0.4 mg total) by mouth every evening.  Dispense: 90 capsule; Refill: 2  4. Risk for falls - Discussed interventions to reduce falls.  Encourage patient to use his walker at home and not the cane.  5. Mixed hyperlipidemia - Well controlled on current regimen.    Follow-up: Return in about 1 week (around 06/02/2019) for BP.   Deliah BostonBritney Malky Rudzinski, MSN, APRN, FNP-C [facility]  Subjective:  Patient ID: Paul Bradshaw Pilant, male    DOB: 09-Nov-1925  Age: 85 y.o. MRN: [id number]  Patient Care Team: Gwenlyn FudgeJoyce, Cruze Zingaro F, FNP as PCP - General (Family Medicine)   CC:   Chief Complaint  Patient presents with  . New Patient (Initial Visit)    HPI Paul Bradshaw Bagg presents to establish care. He is transferring care from Dr. Abrahim Sargent [place]'s office as he has retired and the office has closed.   Patient's blood pressure is elevated in office today.  He does feel a little dizzy, weak, and flushed.  He states that he occasionally takes his blood pressure at home; average systolic 140-150s.  Has not checked it in some time though.  He does report that he sometimes feels like this at home.  He does not check his blood pressure when he does.  Fall Assessment Review: Fall Risk Assessment was positive.  Patient does have a cane and a walker at home but he does also admit that the cane tripped him up before. Falls risk interventions taken today were: Discussed interventions at home e.g shower chairs, grab bars, no loose rugs, keeping floor free of obstacles, etc., Medications were reviewed and Use of walking aids   Review of Systems  Constitutional: Negative for chills, fever, malaise/fatigue and weight loss.  HENT: Negative for congestion, ear discharge, ear pain, nosebleeds, sinus pain, sore throat and tinnitus.   Eyes: Negative for blurred vision, double vision, pain, discharge and redness.  Respiratory: Negative for cough, shortness of breath and wheezing.   Cardiovascular: Negative for chest pain, palpitations and leg swelling.  Gastrointestinal: Negative  for abdominal pain, constipation, diarrhea, heartburn, nausea and vomiting.  Genitourinary: Negative for dysuria, frequency and urgency.  Musculoskeletal: Positive for falls. Negative for myalgias.  Skin: Negative for rash.  Neurological: Positive for dizziness and weakness. Negative for seizures and headaches.  Psychiatric/Behavioral: Negative for depression, substance abuse and suicidal ideas. The patient is not nervous/anxious.     Current Outpatient Medications:  .  acetaminophen (TYLENOL) 500 MG tablet, Take  1,000 mg by mouth every 8 (eight) hours as needed for moderate pain., Disp: , Rfl:  .  aspirin EC 81 MG tablet, Take 81 mg by mouth daily., Disp: , Rfl:  .  atorvastatin (LIPITOR) 10 MG tablet, Take 0.5 tablets (5 mg total) by mouth daily at 6 PM., Disp: 45 tablet, Rfl: 2 .  clopidogrel (PLAVIX) 75 MG tablet, Take 1 tablet (75 mg total) by mouth daily with breakfast., Disp: 90 tablet, Rfl: 2 .  Multiple Vitamins-Minerals (MULTIVITAMIN PO), Take 1 tablet by mouth daily., Disp: , Rfl:  .  tamsulosin (FLOMAX) 0.4 MG CAPS capsule, Take 1 capsule (0.4 mg total) by mouth every evening., Disp: 90 capsule, Rfl: 2 .  lisinopril (ZESTRIL) 5 MG tablet, Take 1 tablet (5 mg total) by mouth daily., Disp: 30 tablet, Rfl: 0  No Known Allergies  Past Medical History:  Diagnosis Date  . Aortic regurgitation    Moderate  . Arthritis   . BPH (benign prostatic hyperplasia)   . Cervical disc disease   . Cervical radiculopathy   . Hyperlipidemia   . Staphylococcus aureus bacteremia 08/09/2012   TEE negative for vegetation or thrombus February 2014  . Stroke [facility]) 2005 or 2006   3  . Symptomatic carotid artery stenosis with infarction [facility]) 2005 or 2006   Status post right carotid endarterectomy    Past Surgical History:  Procedure Laterality Date  . BACK SURGERY    . CAROTID ENDARTERECTOMY    . CATARACT EXTRACTION W/PHACO Left 02/15/2015   Procedure: CATARACT EXTRACTION PHACO AND INTRAOCULAR LENS PLACEMENT LEFT EYE CDE=30.93;  Surgeon: Gemma Payor, MD;  Location: [facility] ORS;  Service: Ophthalmology;  Laterality: Left;  . CHOLECYSTECTOMY    . KIDNEY STONE SURGERY    . LUMBAR LAMINECTOMY/DECOMPRESSION MICRODISCECTOMY Bilateral 01/23/2014   Procedure: LUMBAR LAMINECTOMY/DECOMPRESSION MICRODISCECTOMY 1 LEVEL L5-S1;  Surgeon: Temple Pacini, MD;  Location: [facility] OR[facility];  Service: Neurosurgery;  Laterality: Bilateral;  LUMBAR LAMINECTOMY/DECOMPRESSION MICRODISCECTOMY 1 LEVEL L5-S1  . TEE WITHOUT CARDIOVERSION N/A  08/12/2012   Procedure: TRANSESOPHAGEAL ECHOCARDIOGRAM (TEE);  Surgeon: Wendall Stade, MD;  Location: [facility] SUITE;  Service: Cardiovascular;  Laterality: N/A;  . TEE WITHOUT CARDIOVERSION N/A 06/24/2013   Procedure: TRANSESOPHAGEAL ECHOCARDIOGRAM (TEE);  Surgeon: Jonelle Sidle, MD;  Location: [facility];  Service: Endoscopy;  Laterality: N/A;    Family History  Problem Relation Age of Onset  . Emphysema Father   . Alzheimer's disease Mother   . Heart disease Brother   . Heart attack Brother   . COPD Son     Social History   Socioeconomic History  . Marital status: Married    Spouse name: Not on file  . Number of children: Not on file  . Years of education: Not on file  . Highest education level: Not on file  Occupational History  . Not on file  Social Needs  . Financial resource strain: Not on file  . Food insecurity    Worry: Not on file    Inability: Not on file  . Transportation  needs    Medical: Not on file    Non-medical: Not on file  Tobacco Use  . Smoking status: Never Smoker  . Smokeless tobacco: Never Used  Substance and Sexual Activity  . Alcohol use: No  . Drug use: No  . Sexual activity: Not Currently  Lifestyle  . Physical activity    Days per week: Not on file    Minutes per session: Not on file  . Stress: Not on file  Relationships  . Social Herbalist on phone: Not on file    Gets together: Not on file    Attends religious service: Not on file    Active member of club or organization: Not on file    Attends meetings of clubs or organizations: Not on file    Relationship status: Not on file  . Intimate partner violence    Fear of current or ex partner: Not on file    Emotionally abused: Not on file    Physically abused: Not on file    Forced sexual activity: Not on file  Other Topics Concern  . Not on file  Social History Narrative  . Not on file    Objective:   Today's Vitals: BP (!) 202/94 Comment: manual  Pulse 92    Temp 99.2 F (37.3 C) (Temporal)   Ht 5\' 5"  (1.651 m)   Wt 174 lb (78.9 kg)   SpO2 99%   BMI 28.96 kg/m   BP readings while in office in order: 203/94 190/77 222/102 (manual) - given clonidine 0.1 mg PO x1 202/94 (manual) 170/84 (manual)  Physical Exam Vitals signs reviewed.  Constitutional:      General: He is not in acute distress.    Appearance: Normal appearance. He is overweight. He is not ill-appearing, toxic-appearing or diaphoretic.  HENT:     Head: Normocephalic and atraumatic.  Eyes:     General: No scleral icterus.       Right eye: No discharge.        Left eye: No discharge.     Conjunctiva/sclera: Conjunctivae normal.  Neck:     Musculoskeletal: Normal range of motion.  Cardiovascular:     Rate and Rhythm: Normal rate and regular rhythm.     Heart sounds: Normal heart sounds. No murmur. No friction rub. No gallop.   Pulmonary:     Effort: Pulmonary effort is normal. No respiratory distress.     Breath sounds: Normal breath sounds. No stridor. No wheezing, rhonchi or rales.  Musculoskeletal: Normal range of motion.  Skin:    General: Skin is warm and dry.  Neurological:     Mental Status: He is alert and oriented to person, place, and time. Mental status is at baseline.  Psychiatric:        Mood and Affect: Mood normal.        Behavior: Behavior normal.        Thought Content: Thought content normal.        Judgment: Judgment normal.

## 2019-05-26 NOTE — Patient Instructions (Addendum)
Please bring your blood pressure meter from home to the office so we can compare to our machine.  Keep a log of your blood pressure and bring with you to your next appointment.  Call if your BP is > 180/110.

## 2019-05-27 DIAGNOSIS — R030 Elevated blood-pressure reading, without diagnosis of hypertension: Secondary | ICD-10-CM | POA: Insufficient documentation

## 2019-05-27 DIAGNOSIS — Z9181 History of falling: Secondary | ICD-10-CM | POA: Insufficient documentation

## 2019-05-30 ENCOUNTER — Ambulatory Visit: Payer: Medicare Other

## 2019-05-30 ENCOUNTER — Other Ambulatory Visit: Payer: Self-pay

## 2019-05-30 VITALS — BP 189/94 | HR 103

## 2019-05-30 DIAGNOSIS — Z8673 Personal history of transient ischemic attack (TIA), and cerebral infarction without residual deficits: Secondary | ICD-10-CM

## 2019-05-30 DIAGNOSIS — R030 Elevated blood-pressure reading, without diagnosis of hypertension: Secondary | ICD-10-CM

## 2019-05-30 MED ORDER — LISINOPRIL 10 MG PO TABS: 10.0000 mg | ORAL_TABLET | Freq: Every day | ORAL | 0 refills | Status: DC

## 2019-05-30 NOTE — Addendum Note (Signed)
Addended by: Michaela Corner on: 05/30/2019 02:11 PM   Modules accepted: Orders

## 2019-05-30 NOTE — Progress Notes (Signed)
Patient was seen by Hendricks Limes on 05/26/2019 for new onset elevated blood pressure.  He was given Clonidine in the office and started on Lisinopril 5 mg at home.  He has been monitoring his blood pressures at home and has had the following readings:  12/4:  111/56 8 am           168/81 730 pm  12/5:  129/59 7 am           115/55 8 pm  12/6:  138/66 8 am           195/75 7 pm  12/7:  199/73 6 am  In the office today, patient's blood pressure readings were 194/97, pulse 109.  Rechecked at 189/94, pulse 103.    Patient is concerned about his elevated blood pressure and is afraid to wait until [place] comes back tomorrow for this to be addressed.  Please advise.

## 2019-05-30 NOTE — Progress Notes (Signed)
Patient notified to increase blood pressure medication to 10 mg per day and monitor blood pressure.

## 2019-05-30 NOTE — Progress Notes (Signed)
Tried to contact patient, no answer and mailbox is full.

## 2019-06-02 ENCOUNTER — Ambulatory Visit (INDEPENDENT_AMBULATORY_CARE_PROVIDER_SITE_OTHER): Payer: Medicare Other | Admitting: Family Medicine

## 2019-06-02 ENCOUNTER — Encounter: Payer: Self-pay | Admitting: Family Medicine

## 2019-06-02 DIAGNOSIS — R030 Elevated blood-pressure reading, without diagnosis of hypertension: Secondary | ICD-10-CM | POA: Insufficient documentation

## 2019-06-02 DIAGNOSIS — I10 Essential (primary) hypertension: Secondary | ICD-10-CM

## 2019-06-02 NOTE — Progress Notes (Signed)
Virtual Visit via Telephone Note  I connected with Paul Bradshaw on 06/02/19 at 9:36 AM by telephone and verified that I am speaking with the correct person using two identifiers. Paul Bradshaw is currently located at home and his wife is currently with him during this visit. The provider, Gwenlyn Fudge, FNP is located in their home at time of visit.  I discussed the limitations, risks, security and privacy concerns of performing an evaluation and management service by telephone and the availability of in person appointments. I also discussed with the patient that there may be a patient responsible charge related to this service. The patient expressed understanding and agreed to proceed.  Subjective: PCP: Gwenlyn Fudge, FNP  Chief Complaint  Patient presents with  . Hypertension   Patient is following up on elevated BP.  He was seen by me in the office on 05/26/2019 at which time his blood pressure was significantly elevated at 222/102.  He was started on lisinopril 5 mg once daily at that time.  Patient had a telephone call on 05/30/2019 at which time he reported BP readings in the 190s/70s, his lisinopril was increased from 5 mg to 10 mg once daily at that time.  Since then he has not had significantly elevated readings but did call this morning due to a reading of 205/80.  He reports he woke up this morning went to the bathroom and then went and checked his blood pressure which is when he got this reading.  He waited a short time and drink a glass of water and rechecked where he got a reading of 177/70.   ROS: Per HPI  Current Outpatient Medications:  .  acetaminophen (TYLENOL) 500 MG tablet, Take 1,000 mg by mouth every 8 (eight) hours as needed for moderate pain., Disp: , Rfl:  .  aspirin EC 81 MG tablet, Take 81 mg by mouth daily., Disp: , Rfl:  .  atorvastatin (LIPITOR) 10 MG tablet, Take 0.5 tablets (5 mg total) by mouth daily at 6 PM., Disp: 45 tablet, Rfl: 2 .  clopidogrel  (PLAVIX) 75 MG tablet, Take 1 tablet (75 mg total) by mouth daily with breakfast., Disp: 90 tablet, Rfl: 2 .  lisinopril (ZESTRIL) 10 MG tablet, Take 1 tablet (10 mg total) by mouth daily., Disp: 90 tablet, Rfl: 0 .  Multiple Vitamins-Minerals (MULTIVITAMIN PO), Take 1 tablet by mouth daily., Disp: , Rfl:  .  tamsulosin (FLOMAX) 0.4 MG CAPS capsule, Take 1 capsule (0.4 mg total) by mouth every evening., Disp: 90 capsule, Rfl: 2  No Known Allergies Past Medical History:  Diagnosis Date  . Aortic regurgitation    Moderate  . Arthritis   . BPH (benign prostatic hyperplasia)   . Cervical disc disease   . Cervical radiculopathy   . Hyperlipidemia   . Hypertension   . Staphylococcus aureus bacteremia 08/09/2012   TEE negative for vegetation or thrombus February 2014  . Stroke [facility]) 2005 or 2006   3  . Symptomatic carotid artery stenosis with infarction [facility]) 2005 or 2006   Status post right carotid endarterectomy    Observations/Objective: A&O  No respiratory distress or wheezing audible over the phone Mood, judgement, and thought processes all WNL  Assessment and Plan: 1. Essential hypertension - Encouraged patient to always sit for 10 to 15 minutes and be calm and relaxed before checking his blood pressure.  Also advised to only check blood pressure once daily.  If he wants to change  the time each day that he checks it that is totally fine.  Explained that many people become obsessed with her blood pressure and check it multiple times a day which only causes the readings to be elevated.  He is to call me back if he has readings greater than 180/110, otherwise we will do a follow-up in 2 weeks on the telephone.   Follow Up Instructions:  Return in about 2 weeks (around 06/16/2019) for HTN.  I discussed the assessment and treatment plan with the patient. The patient was provided an opportunity to ask questions and all were answered. The patient agreed with the plan and demonstrated an  understanding of the instructions.   The patient was advised to call back or seek an in-person evaluation if the symptoms worsen or if the condition fails to improve as anticipated.  The above assessment and management plan was discussed with the patient. The patient verbalized understanding of and has agreed to the management plan. Patient is aware to call the clinic if symptoms persist or worsen. Patient is aware when to return to the clinic for a follow-up visit. Patient educated on when it is appropriate to go to the emergency department.   Time call ended: 9:44 AM  I provided 15 minutes of non-face-to-face time during this encounter.  Hendricks Limes, MSN, APRN, FNP-C [facility] 06/02/19

## 2019-07-26 DIAGNOSIS — Z23 Encounter for immunization: Secondary | ICD-10-CM | POA: Diagnosis not present

## 2019-08-23 ENCOUNTER — Ambulatory Visit: Payer: Medicare Other | Admitting: Family Medicine

## 2019-08-25 ENCOUNTER — Other Ambulatory Visit: Payer: Self-pay

## 2019-08-25 NOTE — Progress Notes (Signed)
Assessment & Plan:  1. Essential hypertension - Uncontrolled. Started patient on amlodipine 5 mg once daily. - amLODipine (NORVASC) 5 MG tablet; Take 1 tablet (5 mg total) by mouth daily.  Dispense: 30 tablet; Refill: 2 - CMP14+EGFR - Lipid panel  2. Benign prostatic hyperplasia with nocturia - Well controlled on current regimen.  - tamsulosin (FLOMAX) 0.4 MG CAPS capsule; Take 1 capsule (0.4 mg total) by mouth every evening.  Dispense: 90 capsule; Refill: 2 - PSA, total and free  3. History of CVA (cerebrovascular accident) - Continue atorvastatin.  - atorvastatin (LIPITOR) 10 MG tablet; Take 0.5 tablets (5 mg total) by mouth daily at 6 PM.  Dispense: 45 tablet; Refill: 2 - clopidogrel (PLAVIX) 75 MG tablet; Take 1 tablet (75 mg total) by mouth daily with breakfast.  Dispense: 90 tablet; Refill: 2 - CBC with Differential/Platelet  4. Mixed hyperlipidemia - Well controlled on current regimen.  - atorvastatin (LIPITOR) 10 MG tablet; Take 0.5 tablets (5 mg total) by mouth daily at 6 PM.  Dispense: 45 tablet; Refill: 2   Return in about 3 weeks (around 09/16/2019) for HTN.  Hendricks Limes, MSN, APRN, FNP-C [facility]  Subjective:    Patient ID: Paul Bradshaw, male    DOB: 05-20-1926, 86 y.o.   MRN: [id number]  Patient Care Team: Loman Brooklyn, FNP as PCP - General (Family Medicine)   Chief Complaint:  Chief Complaint  Patient presents with  . Hypertension    3 month follow up    HPI: Paul Bradshaw is a 86 y.o. male presenting on 08/26/2019 for Hypertension (3 month follow up)  Patient is here for a follow-up of hypertension.  He was started on lisinopril 5 mg once daily which was increased to 10 mg once daily in December 2020.  He was to follow-up in 2 weeks, but is just coming in today.  He reports he quit taking the lisinopril because he felt it was making his blood pressure even higher when he took it and also caused him to feel like he was  burning on the inside.   New complaints: None  Social history:  Relevant past medical, surgical, family and social history reviewed and updated as indicated. Interim medical history since our last visit reviewed.  Allergies and medications reviewed and updated.  DATA REVIEWED: CHART IN EPIC  ROS: Negative unless specifically indicated above in HPI.    Current Outpatient Medications:  .  acetaminophen (TYLENOL) 500 MG tablet, Take 1,000 mg by mouth every 8 (eight) hours as needed for moderate pain., Disp: , Rfl:  .  aspirin EC 81 MG tablet, Take 81 mg by mouth daily., Disp: , Rfl:  .  atorvastatin (LIPITOR) 10 MG tablet, Take 0.5 tablets (5 mg total) by mouth daily at 6 PM., Disp: 45 tablet, Rfl: 2 .  clopidogrel (PLAVIX) 75 MG tablet, Take 1 tablet (75 mg total) by mouth daily with breakfast., Disp: 90 tablet, Rfl: 2 .  Multiple Vitamins-Minerals (MULTIVITAMIN PO), Take 1 tablet by mouth daily., Disp: , Rfl:  .  tamsulosin (FLOMAX) 0.4 MG CAPS capsule, Take 1 capsule (0.4 mg total) by mouth every evening., Disp: 90 capsule, Rfl: 2 .  Turmeric 1053 MG TABS, Take by mouth., Disp: , Rfl:  .  amLODipine (NORVASC) 5 MG tablet, Take 1 tablet (5 mg total) by mouth daily., Disp: 30 tablet, Rfl: 2   Allergies  Allergen Reactions  . Lisinopril Other (See Comments)    Elevated  BP higher & causes a burning feeling on the inside.    Past Medical History:  Diagnosis Date  . Aortic regurgitation    Moderate  . Arthritis   . BPH (benign prostatic hyperplasia)   . Cervical disc disease   . Cervical radiculopathy   . Hyperlipidemia   . Hypertension   . Staphylococcus aureus bacteremia 08/09/2012   TEE negative for vegetation or thrombus February 2014  . Stroke [facility]) 2005 or 2006   3  . Symptomatic carotid artery stenosis with infarction [facility]) 2005 or 2006   Status post right carotid endarterectomy    Past Surgical History:  Procedure Laterality Date  . BACK SURGERY    . CAROTID  ENDARTERECTOMY    . CATARACT EXTRACTION W/PHACO Left 02/15/2015   Procedure: CATARACT EXTRACTION PHACO AND INTRAOCULAR LENS PLACEMENT LEFT EYE CDE=30.93;  Surgeon: Tonny Branch, MD;  Location: [facility];  Service: Ophthalmology;  Laterality: Left;  . CHOLECYSTECTOMY    . KIDNEY STONE SURGERY    . LUMBAR LAMINECTOMY/DECOMPRESSION MICRODISCECTOMY Bilateral 01/23/2014   Procedure: LUMBAR LAMINECTOMY/DECOMPRESSION MICRODISCECTOMY 1 LEVEL L5-S1;  Surgeon: Charlie Pitter, MD;  Location: [place] NEURO ORS;  Service: Neurosurgery;  Laterality: Bilateral;  LUMBAR LAMINECTOMY/DECOMPRESSION MICRODISCECTOMY 1 LEVEL L5-S1  . TEE WITHOUT CARDIOVERSION N/A 08/12/2012   Procedure: TRANSESOPHAGEAL ECHOCARDIOGRAM (TEE);  Surgeon: Josue Hector, MD;  Location: [facility];  Service: Cardiovascular;  Laterality: N/A;  . TEE WITHOUT CARDIOVERSION N/A 06/24/2013   Procedure: TRANSESOPHAGEAL ECHOCARDIOGRAM (TEE);  Surgeon: Satira Sark, MD;  Location: [facility];  Service: Endoscopy;  Laterality: N/A;    Social History   Socioeconomic History  . Marital status: Married    Spouse name: Not on file  . Number of children: Not on file  . Years of education: Not on file  . Highest education level: Not on file  Occupational History  . Not on file  Tobacco Use  . Smoking status: Never Smoker  . Smokeless tobacco: Never Used  Substance and Sexual Activity  . Alcohol use: No  . Drug use: No  . Sexual activity: Not Currently  Other Topics Concern  . Not on file  Social History Narrative  . Not on file   Social Determinants of Health   Financial Resource Strain:   . Difficulty of Paying Living Expenses: Not on file  Food Insecurity:   . Worried About Charity fundraiser in the Last Year: Not on file  . Ran Out of Food in the Last Year: Not on file  Transportation Needs:   . Lack of Transportation (Medical): Not on file  . Lack of Transportation (Non-Medical): Not on file  Physical Activity:   . Days of Exercise  per Week: Not on file  . Minutes of Exercise per Session: Not on file  Stress:   . Feeling of Stress : Not on file  Social Connections:   . Frequency of Communication with Friends and Family: Not on file  . Frequency of Social Gatherings with Friends and Family: Not on file  . Attends Religious Services: Not on file  . Active Member of Clubs or Organizations: Not on file  . Attends Archivist Meetings: Not on file  . Marital Status: Not on file  Intimate Partner Violence:   . Fear of Current or Ex-Partner: Not on file  . Emotionally Abused: Not on file  . Physically Abused: Not on file  . Sexually Abused: Not on file  Objective:    BP (!) 174/88   Pulse 88   Temp 98.7 F (37.1 C) (Temporal)   Ht '5\' 5"'$  (1.651 m)   Wt 171 lb 9.6 oz (77.8 kg)   SpO2 98%   BMI 28.56 kg/m   Physical Exam Vitals reviewed.  Constitutional:      General: He is not in acute distress.    Appearance: Normal appearance. He is overweight. He is not ill-appearing, toxic-appearing or diaphoretic.  HENT:     Head: Normocephalic and atraumatic.  Eyes:     General: No scleral icterus.       Right eye: No discharge.        Left eye: No discharge.     Conjunctiva/sclera: Conjunctivae normal.  Cardiovascular:     Rate and Rhythm: Normal rate and regular rhythm.     Heart sounds: Normal heart sounds. No murmur. No friction rub. No gallop.   Pulmonary:     Effort: Pulmonary effort is normal. No respiratory distress.     Breath sounds: Normal breath sounds. No stridor. No wheezing, rhonchi or rales.  Musculoskeletal:        General: Normal range of motion.     Cervical back: Normal range of motion.  Skin:    General: Skin is warm and dry.  Neurological:     Mental Status: He is alert and oriented to person, place, and time. Mental status is at baseline.  Psychiatric:        Mood and Affect: Mood normal.        Behavior: Behavior normal.        Thought Content: Thought content  normal.        Judgment: Judgment normal.     Lab Results  Component Value Date   TSH 2.508 06/21/2013   Lab Results  Component Value Date   WBC 6.3 08/26/2019   HGB 13.7 08/26/2019   HCT 41.7 08/26/2019   MCV 95 08/26/2019   PLT 201 08/26/2019   Lab Results  Component Value Date   NA 141 08/26/2019   K CANCELED 08/26/2019   CO2 23 08/26/2019   GLUCOSE CANCELED 08/26/2019   BUN 16 08/26/2019   CREATININE 0.97 08/26/2019   BILITOT 0.4 08/26/2019   ALKPHOS 87 08/26/2019   AST 23 08/26/2019   ALT 10 08/26/2019   PROT 6.7 08/26/2019   ALBUMIN 4.4 08/26/2019   CALCIUM 8.9 08/26/2019   ANIONGAP 9 05/09/2017   Lab Results  Component Value Date   CHOL 120 08/26/2019   Lab Results  Component Value Date   HDL 57 08/26/2019   Lab Results  Component Value Date   LDLCALC 44 08/26/2019   Lab Results  Component Value Date   TRIG 106 08/26/2019   Lab Results  Component Value Date   CHOLHDL 2.1 08/26/2019   Lab Results  Component Value Date   HGBA1C 5.7 (H) 06/22/2013

## 2019-08-26 ENCOUNTER — Encounter: Payer: Self-pay | Admitting: Family Medicine

## 2019-08-26 ENCOUNTER — Ambulatory Visit (INDEPENDENT_AMBULATORY_CARE_PROVIDER_SITE_OTHER): Payer: Medicare Other | Admitting: Family Medicine

## 2019-08-26 VITALS — BP 174/88 | HR 88 | Temp 98.7°F | Ht 65.0 in | Wt 171.6 lb

## 2019-08-26 DIAGNOSIS — E782 Mixed hyperlipidemia: Secondary | ICD-10-CM

## 2019-08-26 DIAGNOSIS — I10 Essential (primary) hypertension: Secondary | ICD-10-CM | POA: Diagnosis not present

## 2019-08-26 DIAGNOSIS — Z8673 Personal history of transient ischemic attack (TIA), and cerebral infarction without residual deficits: Secondary | ICD-10-CM

## 2019-08-26 DIAGNOSIS — R351 Nocturia: Secondary | ICD-10-CM

## 2019-08-26 DIAGNOSIS — N401 Enlarged prostate with lower urinary tract symptoms: Secondary | ICD-10-CM

## 2019-08-26 MED ORDER — CLOPIDOGREL BISULFATE 75 MG PO TABS: 75.0000 mg | ORAL_TABLET | Freq: Every day | ORAL | 2 refills | Status: DC

## 2019-08-26 MED ORDER — AMLODIPINE BESYLATE 5 MG PO TABS: 5.0000 mg | ORAL_TABLET | Freq: Every day | ORAL | 2 refills | Status: DC

## 2019-08-26 MED ORDER — ATORVASTATIN CALCIUM 10 MG PO TABS: 5.0000 mg | ORAL_TABLET | Freq: Every day | ORAL | 2 refills | Status: DC

## 2019-08-26 MED ORDER — TAMSULOSIN HCL 0.4 MG PO CAPS: 0.4000 mg | ORAL_CAPSULE | Freq: Every evening | ORAL | 2 refills | Status: DC

## 2019-08-27 LAB — LIPID PANEL
Chol/HDL Ratio: 2.1 ratio (ref 0.0–5.0)
Cholesterol, Total: 120 mg/dL (ref 100–199)
HDL: 57 mg/dL (ref >39)
LDL Chol Calc (NIH): 44 mg/dL (ref 0–99)
Triglycerides: 106 mg/dL (ref 0–149)
VLDL Cholesterol Cal: 19 mg/dL (ref 5–40)

## 2019-08-27 LAB — CBC WITH DIFFERENTIAL/PLATELET
Basophils Absolute: 0 10*3/uL (ref 0.0–0.2)
Basos: 1 %
EOS (ABSOLUTE): 0.2 10*3/uL (ref 0.0–0.4)
Eos: 3 %
Hematocrit: 41.7 % (ref 37.5–51.0)
Hemoglobin: 13.7 g/dL (ref 13.0–17.7)
Immature Grans (Abs): 0 10*3/uL (ref 0.0–0.1)
Immature Granulocytes: 1 %
Lymphocytes Absolute: 2 10*3/uL (ref 0.7–3.1)
Lymphs: 32 %
MCH: 31.3 pg (ref 26.6–33.0)
MCHC: 32.9 g/dL (ref 31.5–35.7)
MCV: 95 fL (ref 79–97)
Monocytes Absolute: 0.8 10*3/uL (ref 0.1–0.9)
Monocytes: 12 %
Neutrophils Absolute: 3.2 10*3/uL (ref 1.4–7.0)
Neutrophils: 51 %
Platelets: 201 10*3/uL (ref 150–450)
RBC: 4.38 x10E6/uL (ref 4.14–5.80)
RDW: 11.7 % (ref 11.6–15.4)
WBC: 6.3 10*3/uL (ref 3.4–10.8)

## 2019-08-27 LAB — CMP14+EGFR
ALT: 10 IU/L (ref 0–44)
AST: 23 IU/L (ref 0–40)
Albumin/Globulin Ratio: 1.9 (ref 1.2–2.2)
Albumin: 4.4 g/dL (ref 3.5–4.6)
Alkaline Phosphatase: 87 IU/L (ref 39–117)
BUN/Creatinine Ratio: 16 (ref 10–24)
BUN: 16 mg/dL (ref 10–36)
Bilirubin Total: 0.4 mg/dL (ref 0.0–1.2)
CO2: 23 mmol/L (ref 20–29)
Calcium: 8.9 mg/dL (ref 8.6–10.2)
Chloride: 102 mmol/L (ref 96–106)
Creatinine, Ser: 0.97 mg/dL (ref 0.76–1.27)
GFR calc Af Amer: 79 mL/min/{1.73_m2} (ref >59)
GFR calc non Af Amer: 68 mL/min/{1.73_m2} (ref >59)
Globulin, Total: 2.3 g/dL (ref 1.5–4.5)
Sodium: 141 mmol/L (ref 134–144)
Total Protein: 6.7 g/dL (ref 6.0–8.5)

## 2019-08-27 LAB — PSA, TOTAL AND FREE
PSA, Free Pct: 11.5 %
PSA, Free: 0.23 ng/mL
Prostate Specific Ag, Serum: 2 ng/mL (ref 0.0–4.0)

## 2019-08-28 ENCOUNTER — Encounter: Payer: Self-pay | Admitting: Family Medicine

## 2019-08-30 ENCOUNTER — Encounter: Payer: Self-pay | Admitting: Family Medicine

## 2019-09-15 NOTE — Progress Notes (Signed)
Assessment & Plan:  1. Essential hypertension - Amlodipine added to patient's allergy list. Started him on triamterene-HCTZ today. He will continue checking his BP at home.  - triamterene-hydrochlorothiazide (MAXZIDE-25) 37.5-25 MG tablet; Take 1 tablet by mouth daily.  Dispense: 30 tablet; Refill: 2   Return in about 3 weeks (around 10/07/2019) for HTN.  Deliah Boston, MSN, APRN, FNP-C [facility]  Subjective:    Patient ID: Paul Bradshaw, male    DOB: 1926-04-01, 86 y.o.   MRN: [id number]  Patient Care Team: Gwenlyn Fudge, FNP as PCP - General (Family Medicine)   Chief Complaint:  Chief Complaint  Patient presents with  . Hypertension    3 week   . Leg Swelling    Paient states that the amlodopine has been making his lips swell.     HPI: Paul Bradshaw is a 86 y.o. male presenting on 09/16/2019 for Hypertension (3 week ) and Leg Swelling (Paient states that the amlodopine has been making his lips swell. )  Patient is here for a follow-up of hypertension.  He was started on lisinopril 5 mg once daily which was increased to 10 mg once daily in December 2020.  He reports he quit taking the lisinopril because he felt it was making his blood pressure even higher when he took it and also caused him to feel like he was burning on the inside.  He was started on amlodipine 5 mg once daily three weeks ago which he reports he is only taking every few days because it makes his lips swell. The swelling is not immediate. He says if he takes it for a few days his lips swell. When he stops it for a few days, the swelling goes away. He has a log of his BP from home and it was doing well when he take the amlodipine.    New complaints: None  Social history:  Relevant past medical, surgical, family and social history reviewed and updated as indicated. Interim medical history since our last visit reviewed.  Allergies and medications reviewed and updated.  DATA  REVIEWED: CHART IN EPIC  ROS: Negative unless specifically indicated above in HPI.    Current Outpatient Medications:  .  acetaminophen (TYLENOL) 500 MG tablet, Take 1,000 mg by mouth every 8 (eight) hours as needed for moderate pain., Disp: , Rfl:  .  aspirin EC 81 MG tablet, Take 81 mg by mouth daily., Disp: , Rfl:  .  atorvastatin (LIPITOR) 10 MG tablet, Take 0.5 tablets (5 mg total) by mouth daily at 6 PM., Disp: 45 tablet, Rfl: 2 .  clopidogrel (PLAVIX) 75 MG tablet, Take 1 tablet (75 mg total) by mouth daily with breakfast., Disp: 90 tablet, Rfl: 2 .  Multiple Vitamins-Minerals (MULTIVITAMIN PO), Take 1 tablet by mouth daily., Disp: , Rfl:  .  tamsulosin (FLOMAX) 0.4 MG CAPS capsule, Take 1 capsule (0.4 mg total) by mouth every evening., Disp: 90 capsule, Rfl: 2 .  Turmeric 1053 MG TABS, Take by mouth., Disp: , Rfl:  .  triamterene-hydrochlorothiazide (MAXZIDE-25) 37.5-25 MG tablet, Take 1 tablet by mouth daily., Disp: 30 tablet, Rfl: 2   Allergies  Allergen Reactions  . Amlodipine Swelling    Swelling of lips.  . Lisinopril Other (See Comments)    Elevated BP higher & causes a burning feeling on the inside.    Past Medical History:  Diagnosis Date  . Aortic regurgitation    Moderate  . Arthritis   .  BPH (benign prostatic hyperplasia)   . Cervical disc disease   . Cervical radiculopathy   . Hyperlipidemia   . Hypertension   . Staphylococcus aureus bacteremia 08/09/2012   TEE negative for vegetation or thrombus February 2014  . Stroke [facility]) 2005 or 2006   3  . Symptomatic carotid artery stenosis with infarction [facility]) 2005 or 2006   Status post right carotid endarterectomy    Past Surgical History:  Procedure Laterality Date  . BACK SURGERY    . CAROTID ENDARTERECTOMY    . CATARACT EXTRACTION W/PHACO Left 02/15/2015   Procedure: CATARACT EXTRACTION PHACO AND INTRAOCULAR LENS PLACEMENT LEFT EYE CDE=30.93;  Surgeon: Tonny Branch, MD;  Location: [facility];  Service:  Ophthalmology;  Laterality: Left;  . CHOLECYSTECTOMY    . KIDNEY STONE SURGERY    . LUMBAR LAMINECTOMY/DECOMPRESSION MICRODISCECTOMY Bilateral 01/23/2014   Procedure: LUMBAR LAMINECTOMY/DECOMPRESSION MICRODISCECTOMY 1 LEVEL L5-S1;  Surgeon: Charlie Pitter, MD;  Location: [place] NEURO ORS;  Service: Neurosurgery;  Laterality: Bilateral;  LUMBAR LAMINECTOMY/DECOMPRESSION MICRODISCECTOMY 1 LEVEL L5-S1  . TEE WITHOUT CARDIOVERSION N/A 08/12/2012   Procedure: TRANSESOPHAGEAL ECHOCARDIOGRAM (TEE);  Surgeon: Josue Hector, MD;  [facility];  Service: Cardiovascular;  Laterality: N/A;  . TEE WITHOUT CARDIOVERSION N/A 06/24/2013   Procedure: TRANSESOPHAGEAL ECHOCARDIOGRAM (TEE);  Surgeon: Satira Sark, MD;  [facility];  Service: Endoscopy;  Laterality: N/A;    Social History   Socioeconomic History  . Marital status: Married    Spouse name: Not on file  . Number of children: Not on file  . Years of education: Not on file  . Highest education level: Not on file  Occupational History  . Not on file  Tobacco Use  . Smoking status: Never Smoker  . Smokeless tobacco: Never Used  Substance and Sexual Activity  . Alcohol use: No  . Drug use: No  . Sexual activity: Not Currently  Other Topics Concern  . Not on file  Social History Narrative  . Not on file   Social Determinants of Health   Financial Resource Strain:   . Difficulty of Paying Living Expenses:   Food Insecurity:   . Worried About Charity fundraiser in the Last Year:   . Arboriculturist in the Last Year:   Transportation Needs:   . Film/video editor (Medical):   Marland Kitchen Lack of Transportation (Non-Medical):   Physical Activity:   . Days of Exercise per Week:   . Minutes of Exercise per Session:   Stress:   . Feeling of Stress :   Social Connections:   . Frequency of Communication with Friends and Family:   . Frequency of Social Gatherings with Friends and Family:   . Attends Religious Services:   .  Active Member of Clubs or Organizations:   . Attends Archivist Meetings:   Marland Kitchen Marital Status:   Intimate Partner Violence:   . Fear of Current or Ex-Partner:   . Emotionally Abused:   Marland Kitchen Physically Abused:   . Sexually Abused:         Objective:    BP (!) 149/82   Pulse (!) 103   Temp 99.1 F (37.3 C) (Temporal)   Ht 5\' 5"  (1.651 m)   Wt 170 lb (77.1 kg)   SpO2 97%   BMI 28.29 kg/m   Wt Readings from Last 3 Encounters:  09/16/19 170 lb (77.1 kg)  08/26/19 171 lb 9.6 oz (77.8 kg)  05/26/19 174  lb (78.9 kg)    Physical Exam Vitals reviewed.  Constitutional:      General: He is not in acute distress.    Appearance: Normal appearance. He is overweight. He is not ill-appearing, toxic-appearing or diaphoretic.  HENT:     Head: Normocephalic and atraumatic.  Eyes:     General: No scleral icterus.       Right eye: No discharge.        Left eye: No discharge.     Conjunctiva/sclera: Conjunctivae normal.  Cardiovascular:     Rate and Rhythm: Normal rate and regular rhythm.     Heart sounds: Normal heart sounds. No murmur. No friction rub. No gallop.   Pulmonary:     Effort: Pulmonary effort is normal. No respiratory distress.     Breath sounds: Normal breath sounds. No stridor. No wheezing, rhonchi or rales.  Musculoskeletal:        General: Normal range of motion.     Cervical back: Normal range of motion.  Skin:    General: Skin is warm and dry.  Neurological:     Mental Status: He is alert and oriented to person, place, and time. Mental status is at baseline.  Psychiatric:        Mood and Affect: Mood normal.        Behavior: Behavior normal.        Thought Content: Thought content normal.        Judgment: Judgment normal.     Lab Results  Component Value Date   TSH 2.508 06/21/2013   Lab Results  Component Value Date   WBC 6.3 08/26/2019   HGB 13.7 08/26/2019   HCT 41.7 08/26/2019   MCV 95 08/26/2019   PLT 201 08/26/2019   Lab Results    Component Value Date   NA 141 08/26/2019   K CANCELED 08/26/2019   CO2 23 08/26/2019   GLUCOSE CANCELED 08/26/2019   BUN 16 08/26/2019   CREATININE 0.97 08/26/2019   BILITOT 0.4 08/26/2019   ALKPHOS 87 08/26/2019   AST 23 08/26/2019   ALT 10 08/26/2019   PROT 6.7 08/26/2019   ALBUMIN 4.4 08/26/2019   CALCIUM 8.9 08/26/2019   ANIONGAP 9 05/09/2017   Lab Results  Component Value Date   CHOL 120 08/26/2019   Lab Results  Component Value Date   HDL 57 08/26/2019   Lab Results  Component Value Date   LDLCALC 44 08/26/2019   Lab Results  Component Value Date   TRIG 106 08/26/2019   Lab Results  Component Value Date   CHOLHDL 2.1 08/26/2019   Lab Results  Component Value Date   HGBA1C 5.7 (H) 06/22/2013

## 2019-09-16 ENCOUNTER — Ambulatory Visit (INDEPENDENT_AMBULATORY_CARE_PROVIDER_SITE_OTHER): Payer: Medicare Other | Admitting: Family Medicine

## 2019-09-16 ENCOUNTER — Other Ambulatory Visit: Payer: Self-pay

## 2019-09-16 ENCOUNTER — Encounter: Payer: Self-pay | Admitting: Family Medicine

## 2019-09-16 VITALS — BP 149/82 | HR 103 | Temp 99.1°F | Ht 65.0 in | Wt 170.0 lb

## 2019-09-16 DIAGNOSIS — I10 Essential (primary) hypertension: Secondary | ICD-10-CM

## 2019-09-16 MED ORDER — TRIAMTERENE-HCTZ 37.5-25 MG PO TABS: 1.0000 | ORAL_TABLET | Freq: Every day | ORAL | 2 refills | Status: DC

## 2019-09-20 ENCOUNTER — Ambulatory Visit: Payer: Medicare Other

## 2019-10-11 ENCOUNTER — Encounter: Payer: Self-pay | Admitting: Family Medicine

## 2019-10-11 ENCOUNTER — Ambulatory Visit (INDEPENDENT_AMBULATORY_CARE_PROVIDER_SITE_OTHER): Payer: Medicare Other | Admitting: Family Medicine

## 2019-10-11 ENCOUNTER — Other Ambulatory Visit: Payer: Self-pay

## 2019-10-11 VITALS — BP 165/84 | HR 106 | Temp 98.2°F | Ht 65.0 in | Wt 170.8 lb

## 2019-10-11 DIAGNOSIS — I10 Essential (primary) hypertension: Secondary | ICD-10-CM

## 2019-10-11 NOTE — Progress Notes (Signed)
Assessment & Plan:  1. Essential hypertension - I am hesitant to add another blood pressure medicine today since his readings at home have actually been quite good.  I have asked him to keep a log of his blood pressures for the next 2 weeks and bring this in for a nurse visit where he does not have to see me.  I am hopeful his blood pressure is lower when just seen the nurse and not me.  Education provided on the DASH diet.   Return in about 2 weeks (around 10/25/2019) for BP check with nurse.  Paul Boston, MSN, APRN, FNP-C [facility]  Subjective:    Patient ID: Paul Bradshaw, male    DOB: June 08, 1926, 86 y.o.   MRN: [id number]  Patient Care Team: Gwenlyn Fudge, FNP as PCP - General (Family Medicine)   Chief Complaint:  Chief Complaint  Patient presents with  . Hypertension    3 week re check.  Patient states he took his bp med x 1 week and his bp went low and his balance was off so he stopped them.    HPI: Paul Bradshaw is a 86 y.o. male presenting on 10/11/2019 for Hypertension (3 week re check.  Patient states he took his bp med x 1 week and his bp went low and his balance was off so he stopped them.)  Patient is here for follow-up of hypertension.  He was put on hydrochlorothiazide triamterene at his last visit which he reports he only took for a week before he stopped it.  He stopped the medication because he was outside mowing and had an episode of dizziness and feeling off balance which he felt was related to having a low blood pressure.  He did bring his blood pressure meter with him today which was reviewed.  A majority of his systolic readings are 120-130s, the high systolic I saw was 154.  His average diastolic is 50-60.  These readings have been in the past 2 weeks when he has not been on any medication.  He does have a wrist cuff but does hold it up to the level of his heart when he is checking his blood pressure.  New complaints: None  Social  history:  Relevant past medical, surgical, family and social history reviewed and updated as indicated. Interim medical history since our last visit reviewed.  Allergies and medications reviewed and updated.  DATA REVIEWED: CHART IN EPIC  ROS: Negative unless specifically indicated above in HPI.    Current Outpatient Medications:  .  acetaminophen (TYLENOL) 500 MG tablet, Take 1,000 mg by mouth every 8 (eight) hours as needed for moderate pain., Disp: , Rfl:  .  aspirin EC 81 MG tablet, Take 81 mg by mouth daily., Disp: , Rfl:  .  atorvastatin (LIPITOR) 10 MG tablet, Take 0.5 tablets (5 mg total) by mouth daily at 6 PM., Disp: 45 tablet, Rfl: 2 .  clopidogrel (PLAVIX) 75 MG tablet, Take 1 tablet (75 mg total) by mouth daily with breakfast., Disp: 90 tablet, Rfl: 2 .  Multiple Vitamins-Minerals (MULTIVITAMIN PO), Take 1 tablet by mouth daily., Disp: , Rfl:  .  tamsulosin (FLOMAX) 0.4 MG CAPS capsule, Take 1 capsule (0.4 mg total) by mouth every evening., Disp: 90 capsule, Rfl: 2 .  Turmeric 1053 MG TABS, Take by mouth., Disp: , Rfl:  .  triamterene-hydrochlorothiazide (MAXZIDE-25) 37.5-25 MG tablet, Take 1 tablet by mouth daily. (Patient not taking: Reported on 10/11/2019),  Disp: 30 tablet, Rfl: 2   Allergies  Allergen Reactions  . Amlodipine Swelling    Swelling of lips.  . Lisinopril Other (See Comments)    Elevated BP higher & causes a burning feeling on the inside.    Past Medical History:  Diagnosis Date  . Aortic regurgitation    Moderate  . Arthritis   . BPH (benign prostatic hyperplasia)   . Cervical disc disease   . Cervical radiculopathy   . Hyperlipidemia   . Hypertension   . Staphylococcus aureus bacteremia 08/09/2012   TEE negative for vegetation or thrombus February 2014  . Stroke [facility]) 2005 or 2006   3  . Symptomatic carotid artery stenosis with infarction [facility]) 2005 or 2006   Status post right carotid endarterectomy    Past Surgical History:  Procedure  Laterality Date  . BACK SURGERY    . CAROTID ENDARTERECTOMY    . CATARACT EXTRACTION W/PHACO Left 02/15/2015   Procedure: CATARACT EXTRACTION PHACO AND INTRAOCULAR LENS PLACEMENT LEFT EYE CDE=30.93;  Surgeon: Tonny Branch, MD;  Location: [facility] ORS;  Service: Ophthalmology;  Laterality: Left;  . CHOLECYSTECTOMY    . KIDNEY STONE SURGERY    . LUMBAR LAMINECTOMY/DECOMPRESSION MICRODISCECTOMY Bilateral 01/23/2014   Procedure: LUMBAR LAMINECTOMY/DECOMPRESSION MICRODISCECTOMY 1 LEVEL L5-S1;  Surgeon: Charlie Pitter, MD;  Location: [place] NEURO ORS;  Service: Neurosurgery;  Laterality: Bilateral;  LUMBAR LAMINECTOMY/DECOMPRESSION MICRODISCECTOMY 1 LEVEL L5-S1  . TEE WITHOUT CARDIOVERSION N/A 08/12/2012   Procedure: TRANSESOPHAGEAL ECHOCARDIOGRAM (TEE);  Surgeon: Josue Hector, MD;  Location: [facility] ENDO SUITE;  Service: Cardiovascular;  Laterality: N/A;  . TEE WITHOUT CARDIOVERSION N/A 06/24/2013   Procedure: TRANSESOPHAGEAL ECHOCARDIOGRAM (TEE);  Surgeon: Satira Sark, MD;  Location: [facility] ENDO SUITE;  Service: Endoscopy;  Laterality: N/A;    Social History   Socioeconomic History  . Marital status: Married    Spouse name: Not on file  . Number of children: Not on file  . Years of education: Not on file  . Highest education level: Not on file  Occupational History  . Not on file  Tobacco Use  . Smoking status: Never Smoker  . Smokeless tobacco: Never Used  Substance and Sexual Activity  . Alcohol use: No  . Drug use: No  . Sexual activity: Not Currently  Other Topics Concern  . Not on file  Social History Narrative  . Not on file   Social Determinants of Health   Financial Resource Strain:   . Difficulty of Paying Living Expenses:   Food Insecurity:   . Worried About Charity fundraiser in the Last Year:   . Arboriculturist in the Last Year:   Transportation Needs:   . Film/video editor (Medical):   Marland Kitchen Lack of Transportation (Non-Medical):   Physical Activity:   . Days of Exercise per  Week:   . Minutes of Exercise per Session:   Stress:   . Feeling of Stress :   Social Connections:   . Frequency of Communication with Friends and Family:   . Frequency of Social Gatherings with Friends and Family:   . Attends Religious Services:   . Active Member of Clubs or Organizations:   . Attends Archivist Meetings:   Marland Kitchen Marital Status:   Intimate Partner Violence:   . Fear of Current or Ex-Partner:   . Emotionally Abused:   Marland Kitchen Physically Abused:   . Sexually Abused:         Objective:  BP (!) 165/84   Pulse (!) 106   Temp 98.2 F (36.8 C) (Temporal)   Ht 5\' 5"  (1.651 m)   Wt 170 lb 12.8 oz (77.5 kg)   SpO2 98%   BMI 28.42 kg/m   Wt Readings from Last 3 Encounters:  10/11/19 170 lb 12.8 oz (77.5 kg)  09/16/19 170 lb (77.1 kg)  08/26/19 171 lb 9.6 oz (77.8 kg)    Physical Exam Vitals reviewed.  Constitutional:      General: He is not in acute distress.    Appearance: Normal appearance. He is not ill-appearing, toxic-appearing or diaphoretic.  HENT:     Head: Normocephalic and atraumatic.  Eyes:     General: No scleral icterus.       Right eye: No discharge.        Left eye: No discharge.     Conjunctiva/sclera: Conjunctivae normal.  Cardiovascular:     Rate and Rhythm: Normal rate and regular rhythm.     Heart sounds: Normal heart sounds. No murmur. No friction rub. No gallop.   Pulmonary:     Effort: Pulmonary effort is normal. No respiratory distress.     Breath sounds: Normal breath sounds. No stridor. No wheezing, rhonchi or rales.  Musculoskeletal:        General: Normal range of motion.     Cervical back: Normal range of motion.  Skin:    General: Skin is warm and dry.  Neurological:     Mental Status: He is alert and oriented to person, place, and time. Mental status is at baseline.  Psychiatric:        Mood and Affect: Mood normal.        Behavior: Behavior normal.        Thought Content: Thought content normal.         Judgment: Judgment normal.     Lab Results  Component Value Date   TSH 2.508 06/21/2013   Lab Results  Component Value Date   WBC 6.3 08/26/2019   HGB 13.7 08/26/2019   HCT 41.7 08/26/2019   MCV 95 08/26/2019   PLT 201 08/26/2019   Lab Results  Component Value Date   NA 141 08/26/2019   K CANCELED 08/26/2019   CO2 23 08/26/2019   GLUCOSE CANCELED 08/26/2019   BUN 16 08/26/2019   CREATININE 0.97 08/26/2019   BILITOT 0.4 08/26/2019   ALKPHOS 87 08/26/2019   AST 23 08/26/2019   ALT 10 08/26/2019   PROT 6.7 08/26/2019   ALBUMIN 4.4 08/26/2019   CALCIUM 8.9 08/26/2019   ANIONGAP 9 05/09/2017   Lab Results  Component Value Date   CHOL 120 08/26/2019   Lab Results  Component Value Date   HDL 57 08/26/2019   Lab Results  Component Value Date   LDLCALC 44 08/26/2019   Lab Results  Component Value Date   TRIG 106 08/26/2019   Lab Results  Component Value Date   CHOLHDL 2.1 08/26/2019   Lab Results  Component Value Date   HGBA1C 5.7 (H) 06/22/2013

## 2019-10-11 NOTE — Patient Instructions (Signed)
DASH Eating Plan DASH stands for "Dietary Approaches to Stop Hypertension." The DASH eating plan is a healthy eating plan that has been shown to reduce high blood pressure (hypertension). It may also reduce your risk for type 2 diabetes, heart disease, and stroke. The DASH eating plan may also help with weight loss. What are tips for following this plan?  General guidelines  Avoid eating more than 2,300 mg (milligrams) of salt (sodium) a day. If you have hypertension, you may need to reduce your sodium intake to 1,500 mg a day.  Limit alcohol intake to no more than 1 drink a day for nonpregnant women and 2 drinks a day for men. One drink equals 12 oz of beer, 5 oz of wine, or 1 oz of hard liquor.  Work with your health care provider to maintain a healthy body weight or to lose weight. Ask what an ideal weight is for you.  Get at least 30 minutes of exercise that causes your heart to beat faster (aerobic exercise) most days of the week. Activities may include walking, swimming, or biking.  Work with your health care provider or diet and nutrition specialist (dietitian) to adjust your eating plan to your individual calorie needs. Reading food labels   Check food labels for the amount of sodium per serving. Choose foods with less than 5 percent of the Daily Value of sodium. Generally, foods with less than 300 mg of sodium per serving fit into this eating plan.  To find whole grains, look for the word "whole" as the first word in the ingredient list. Shopping  Buy products labeled as "low-sodium" or "no salt added."  Buy fresh foods. Avoid canned foods and premade or frozen meals. Cooking  Avoid adding salt when cooking. Use salt-free seasonings or herbs instead of table salt or sea salt. Check with your health care provider or pharmacist before using salt substitutes.  Do not fry foods. Cook foods using healthy methods such as baking, boiling, grilling, and broiling instead.  Cook with  heart-healthy oils, such as olive, canola, soybean, or sunflower oil. Meal planning  Eat a balanced diet that includes: ? 5 or more servings of fruits and vegetables each day. At each meal, try to fill half of your plate with fruits and vegetables. ? Up to 6-8 servings of whole grains each day. ? Less than 6 oz of lean meat, poultry, or fish each day. A 3-oz serving of meat is about the same size as a deck of cards. One egg equals 1 oz. ? 2 servings of low-fat dairy each day. ? A serving of nuts, seeds, or beans 5 times each week. ? Heart-healthy fats. Healthy fats called Omega-3 fatty acids are found in foods such as flaxseeds and coldwater fish, like sardines, salmon, and mackerel.  Limit how much you eat of the following: ? Canned or prepackaged foods. ? Food that is high in trans fat, such as fried foods. ? Food that is high in saturated fat, such as fatty meat. ? Sweets, desserts, sugary drinks, and other foods with added sugar. ? Full-fat dairy products.  Do not salt foods before eating.  Try to eat at least 2 vegetarian meals each week.  Eat more home-cooked food and less restaurant, buffet, and fast food.  When eating at a restaurant, ask that your food be prepared with less salt or no salt, if possible. What foods are recommended? The items listed may not be a complete list. Talk with your dietitian about   what dietary choices are best for you. Grains Whole-grain or whole-wheat bread. Whole-grain or whole-wheat pasta. Brown rice. Oatmeal. Quinoa. Bulgur. Whole-grain and low-sodium cereals. Pita bread. Low-fat, low-sodium crackers. Whole-wheat flour tortillas. Vegetables Fresh or frozen vegetables (raw, steamed, roasted, or grilled). Low-sodium or reduced-sodium tomato and vegetable juice. Low-sodium or reduced-sodium tomato sauce and tomato paste. Low-sodium or reduced-sodium canned vegetables. Fruits All fresh, dried, or frozen fruit. Canned fruit in natural juice (without  added sugar). Meat and other protein foods Skinless chicken or turkey. Ground chicken or turkey. Pork with fat trimmed off. Fish and seafood. Egg whites. Dried beans, peas, or lentils. Unsalted nuts, nut butters, and seeds. Unsalted canned beans. Lean cuts of beef with fat trimmed off. Low-sodium, lean deli meat. Dairy Low-fat (1%) or fat-free (skim) milk. Fat-free, low-fat, or reduced-fat cheeses. Nonfat, low-sodium ricotta or cottage cheese. Low-fat or nonfat yogurt. Low-fat, low-sodium cheese. Fats and oils Soft margarine without trans fats. Vegetable oil. Low-fat, reduced-fat, or light mayonnaise and salad dressings (reduced-sodium). Canola, safflower, olive, soybean, and sunflower oils. Avocado. Seasoning and other foods Herbs. Spices. Seasoning mixes without salt. Unsalted popcorn and pretzels. Fat-free sweets. What foods are not recommended? The items listed may not be a complete list. Talk with your dietitian about what dietary choices are best for you. Grains Baked goods made with fat, such as croissants, muffins, or some breads. Dry pasta or rice meal packs. Vegetables Creamed or fried vegetables. Vegetables in a cheese sauce. Regular canned vegetables (not low-sodium or reduced-sodium). Regular canned tomato sauce and paste (not low-sodium or reduced-sodium). Regular tomato and vegetable juice (not low-sodium or reduced-sodium). Pickles. Olives. Fruits Canned fruit in a light or heavy syrup. Fried fruit. Fruit in cream or butter sauce. Meat and other protein foods Fatty cuts of meat. Ribs. Fried meat. Bacon. Sausage. Bologna and other processed lunch meats. Salami. Fatback. Hotdogs. Bratwurst. Salted nuts and seeds. Canned beans with added salt. Canned or smoked fish. Whole eggs or egg yolks. Chicken or turkey with skin. Dairy Whole or 2% milk, cream, and half-and-half. Whole or full-fat cream cheese. Whole-fat or sweetened yogurt. Full-fat cheese. Nondairy creamers. Whipped toppings.  Processed cheese and cheese spreads. Fats and oils Butter. Stick margarine. Lard. Shortening. Ghee. Bacon fat. Tropical oils, such as coconut, palm kernel, or palm oil. Seasoning and other foods Salted popcorn and pretzels. Onion salt, garlic salt, seasoned salt, table salt, and sea salt. Worcestershire sauce. Tartar sauce. Barbecue sauce. Teriyaki sauce. Soy sauce, including reduced-sodium. Steak sauce. Canned and packaged gravies. Fish sauce. Oyster sauce. Cocktail sauce. Horseradish that you find on the shelf. Ketchup. Mustard. Meat flavorings and tenderizers. Bouillon cubes. Hot sauce and Tabasco sauce. Premade or packaged marinades. Premade or packaged taco seasonings. Relishes. Regular salad dressings. Where to find more information:  National Heart, Lung, and Blood Institute: www.nhlbi.nih.gov  American Heart Association: www.heart.org Summary  The DASH eating plan is a healthy eating plan that has been shown to reduce high blood pressure (hypertension). It may also reduce your risk for type 2 diabetes, heart disease, and stroke.  With the DASH eating plan, you should limit salt (sodium) intake to 2,300 mg a day. If you have hypertension, you may need to reduce your sodium intake to 1,500 mg a day.  When on the DASH eating plan, aim to eat more fresh fruits and vegetables, whole grains, lean proteins, low-fat dairy, and heart-healthy fats.  Work with your health care provider or diet and nutrition specialist (dietitian) to adjust your eating plan to your   individual calorie needs. This information is not intended to replace advice given to you by your health care provider. Make sure you discuss any questions you have with your health care provider. Document Revised: 05/22/2017 Document Reviewed: 06/02/2016 Elsevier Patient Education  2020 Elsevier Inc.  

## 2019-10-25 ENCOUNTER — Other Ambulatory Visit: Payer: Self-pay

## 2019-10-25 ENCOUNTER — Ambulatory Visit: Payer: Medicare Other

## 2019-10-25 DIAGNOSIS — Z0130 Encounter for examination of blood pressure without abnormal findings: Secondary | ICD-10-CM

## 2019-10-25 NOTE — Progress Notes (Signed)
Patient came in today for a BP check.  First reading was 192/87 p-89.  Patient sat for a few min's and relaxed and we re took his BP.  Second reading was 162/85 p-80. I let patient sit for 5 more mins and re checked his bp. Last reading was 165/77 p-81. Patient brought his bp readings with him- made a copy and put it in your INBOX on your desk.

## 2019-11-08 NOTE — Progress Notes (Signed)
Patient has been checking BP twice daily at home. Systolic ranges 124-172 with 11/30 > 150. Of those there was a 172; 5/11 were in the 160s. Diastolic ranges 46-73. HR ranges 58-87. While he does have some readings that are higher than normal, he does not get as high at home as he does here. I will not treat with medication at this time.

## 2019-12-23 ENCOUNTER — Ambulatory Visit (INDEPENDENT_AMBULATORY_CARE_PROVIDER_SITE_OTHER): Payer: Medicare Other | Admitting: Nurse Practitioner

## 2019-12-23 ENCOUNTER — Other Ambulatory Visit: Payer: Self-pay

## 2019-12-23 ENCOUNTER — Encounter: Payer: Self-pay | Admitting: Nurse Practitioner

## 2019-12-23 VITALS — BP 144/78 | HR 93 | Temp 98.1°F | Resp 20 | Ht 65.0 in | Wt 165.0 lb

## 2019-12-23 DIAGNOSIS — R42 Dizziness and giddiness: Secondary | ICD-10-CM

## 2019-12-23 NOTE — Progress Notes (Addendum)
Subjective:    Patient ID: Paul Bradshaw, male    DOB: [birth year], 86 y.o.   MRN: [id number]  Chief Complaint: Dizziness Patient reports severe fatigue and dizziness. Most of the time, he experiences vertigo more often when he stands up, feeling like the world is spinning. Patient reports feeling like he wants to sleep more often. Patient stopped taking his blood pressure medication 3-4 months ago because that is when these feelings began, it has not improved since he stopped taking the medication. Reports having no difficulty with balance when he is holding onto something. Walks with a cane since these episodes began, has helped.    Review of Systems  Constitutional: Positive for fatigue.  HENT: Negative.   Eyes: Positive for visual disturbance.  Respiratory: Negative.   Cardiovascular: Negative.   Gastrointestinal: Negative.   Endocrine: Negative.   Genitourinary: Negative.   Musculoskeletal: Negative.   Allergic/Immunologic: Negative.   Neurological: Positive for dizziness, weakness and light-headedness.  Hematological: Negative.   Psychiatric/Behavioral: Negative.        Objective:   Physical Exam Vitals reviewed.  Constitutional:      Appearance: Normal appearance. He is normal weight.  HENT:     Head: Normocephalic and atraumatic.     Right Ear: Tympanic membrane, ear canal and external ear normal.     Left Ear: Tympanic membrane, ear canal and external ear normal.     Mouth/Throat:     Mouth: Mucous membranes are moist.     Pharynx: Oropharynx is clear.  Eyes:     Extraocular Movements: Extraocular movements intact.     Conjunctiva/sclera: Conjunctivae normal.     Pupils: Pupils are equal, round, and reactive to light.  Cardiovascular:     Rate and Rhythm: Normal rate and regular rhythm.     Pulses: Normal pulses.     Heart sounds: Normal heart sounds.  Pulmonary:     Effort: Pulmonary effort is normal.     Breath sounds: Normal breath sounds.  Abdominal:      General: Abdomen is flat. Bowel sounds are normal.     Palpations: Abdomen is soft.  Genitourinary:    Penis: Normal.      Testes: Normal.     Prostate: Normal.     Rectum: Normal.  Musculoskeletal:        General: Normal range of motion.     Cervical back: Normal range of motion and neck supple.  Skin:    General: Skin is warm and dry.     Capillary Refill: Capillary refill takes less than 2 seconds.  Neurological:     General: No focal deficit present.     Mental Status: He is alert and oriented to person, place, and time. Mental status is at baseline.     Motor: Weakness present.  Psychiatric:        Mood and Affect: Mood normal.        Behavior: Behavior normal.        Thought Content: Thought content normal.        Judgment: Judgment normal.    BP (!) 144/78   Pulse 93   Temp 98.1 F (36.7 C) (Temporal)   Resp 20   Ht 5\' 5"  (1.651 m)   Wt 165 lb (74.8 kg)   SpO2 97%   BMI 27.46 kg/m         Assessment & Plan:  Jase L Hengel in today with chief complaint of Dizziness (BP fluctutating) and Extremity Weakness  1. Dizziness Rise slowly from sitting to standing Continue meclizine as needed - Ambulatory referral to Neurology    The above assessment and management plan was discussed with the patient. The patient verbalized understanding of and has agreed to the management plan. Patient is aware to call the clinic if symptoms persist or worsen. Patient is aware when to return to the clinic for a follow-up visit. Patient educated on when it is appropriate to go to the emergency department.   [name] Daphine Deutscher, FNP

## 2019-12-23 NOTE — Patient Instructions (Signed)

## 2020-01-04 ENCOUNTER — Encounter: Payer: Self-pay | Admitting: Neurology

## 2020-01-04 ENCOUNTER — Ambulatory Visit: Payer: Medicare Other | Admitting: Neurology

## 2020-01-04 VITALS — Ht 65.0 in | Wt 167.3 lb

## 2020-01-04 DIAGNOSIS — R42 Dizziness and giddiness: Secondary | ICD-10-CM

## 2020-01-04 NOTE — Progress Notes (Signed)
Subjective:    Patient ID: Paul Bradshaw is a 86 y.o. male.  HPI     Huston Foley, MD, PhD [facility] [address] [address] [place], [place] [address]  Dear Paul Bradshaw,   I saw your patient, Paul Bradshaw, upon your kind request, in my Neurologic clinic today for initial consultation of his dizziness, concern for vertigo.  The patient is accompanied by his step-daughter today.  As you know, [name] is a 86 year old right-handed gentleman with an underlying medical history of stroke, carotid artery stenosis, hypertension, hyperlipidemia, cervical disc disease, BPH, arthritis, aortic regurgitation, and mildly overweight state, who reports a several month history of lightheadedness upon standing, when he gets up from the seated position.  He feels that his legs become shaky, he feels lightheaded, has to hold onto things.  Recently, he had to sit all the way down on the ground, did not actually fall or hurt himself thankfully.  He has checked his own blood pressure at home when he feels dizzy and has noted a drop in the blood pressure, typically he is without symptoms when his systolic blood pressure is in the 140s but he has had numbers in the 110s over 50s he reports.  He does not have any orthostatic blood pressure drop today on examination and does not have any significant symptoms on orthostatic testing but reports that symptoms come and go.  He denies any spinning sensation, does have hearing loss and does not use his hearing aids because he gets too sensitive to sounds.  He lives with his wife, denies any sudden onset of one-sided weakness or numbness or tingling or droopy face or slurring of speech, has had symptoms of stroke before and reports that he also had motion sickness before but his symptoms currently feel different from that. He denies any nausea or vomiting.  He does have occasional ringing in the ears, seems like it is on the right side. I  reviewed your office note from 12/23/2019.  He had a head CT without contrast on 05/03/2014 and I have reviewed the results: IMPRESSION: Atrophy and chronic ischemia.  No acute abnormality.  He had a brain MRI with contrast on 06/21/2013 and I reviewed the results: IMPRESSION:  Tiny bilateral frontal lobe infarcts.   Please see above for additional findings.    His Past Medical History Is Significant For: Past Medical History:  Diagnosis Date  . Aortic regurgitation    Moderate  . Arthritis   . BPH (benign prostatic hyperplasia)   . Cervical disc disease   . Cervical radiculopathy   . Hyperlipidemia   . Hypertension   . Staphylococcus aureus bacteremia 08/09/2012   TEE negative for vegetation or thrombus February 2014  . Stroke [facility]) 2005 or 2006   3  . Symptomatic carotid artery stenosis with infarction [facility]) 2005 or 2006   Status post right carotid endarterectomy    His Past Surgical History Is Significant For: Past Surgical History:  Procedure Laterality Date  . BACK SURGERY    . CAROTID ENDARTERECTOMY    . CATARACT EXTRACTION W/PHACO Left 02/15/2015   Procedure: CATARACT EXTRACTION PHACO AND INTRAOCULAR LENS PLACEMENT LEFT EYE CDE=30.93;  Surgeon: Gemma Payor, MD;  Location: [facility];  Service: Ophthalmology;  Laterality: Left;  . CHOLECYSTECTOMY    . KIDNEY STONE SURGERY    . LUMBAR LAMINECTOMY/DECOMPRESSION MICRODISCECTOMY Bilateral 01/23/2014   Procedure: LUMBAR LAMINECTOMY/DECOMPRESSION MICRODISCECTOMY 1 LEVEL L5-S1;  Surgeon: Temple Pacini, MD;  Location:  [facility];  Service: Neurosurgery;  Laterality: Bilateral;  LUMBAR LAMINECTOMY/DECOMPRESSION MICRODISCECTOMY 1 LEVEL L5-S1  . TEE WITHOUT CARDIOVERSION N/A 08/12/2012   Procedure: TRANSESOPHAGEAL ECHOCARDIOGRAM (TEE);  Surgeon: Wendall Stade, MD;  Location: [facility];  Service: Cardiovascular;  Laterality: N/A;  . TEE WITHOUT CARDIOVERSION N/A 06/24/2013   Procedure: TRANSESOPHAGEAL ECHOCARDIOGRAM (TEE);  Surgeon: Jonelle Sidle, MD;  Location: [facility];  Service: Endoscopy;  Laterality: N/A;    His Family History Is Significant For: Family History  Problem Relation Age of Onset  . Emphysema Father   . Alzheimer's disease Mother   . Heart disease Brother   . Heart attack Brother   . COPD Son     His Social History Is Significant For: Social History   Socioeconomic History  . Marital status: Married    Spouse name: Not on file  . Number of children: Not on file  . Years of education: Not on file  . Highest education level: Not on file  Occupational History  . Not on file  Tobacco Use  . Smoking status: Never Smoker  . Smokeless tobacco: Never Used  Vaping Use  . Vaping Use: Never used  Substance and Sexual Activity  . Alcohol use: No  . Drug use: No  . Sexual activity: Not Currently  Other Topics Concern  . Not on file  Social History Narrative  . Not on file   Social Determinants of Health   Financial Resource Strain:   . Difficulty of Paying Living Expenses:   Food Insecurity:   . Worried About Programme researcher, broadcasting/film/video in the Last Year:   . Barista in the Last Year:   Transportation Needs:   . Freight forwarder (Medical):   Marland Kitchen Lack of Transportation (Non-Medical):   Physical Activity:   . Days of Exercise per Week:   . Minutes of Exercise per Session:   Stress:   . Feeling of Stress :   Social Connections:   . Frequency of Communication with Friends and Family:   . Frequency of Social Gatherings with Friends and Family:   . Attends Religious Services:   . Active Member of Clubs or Organizations:   . Attends Banker Meetings:   Marland Kitchen Marital Status:     His Allergies Are:  Allergies  Allergen Reactions  . Amlodipine Swelling    Swelling of lips.  . Lisinopril Other (See Comments)    Elevated BP higher & causes a burning feeling on the inside.   :   His Current Medications Are:  Outpatient Encounter Medications as of 01/04/2020  Medication  Sig  . acetaminophen (TYLENOL) 500 MG tablet Take 1,000 mg by mouth every 8 (eight) hours as needed for moderate pain.  Marland Kitchen aspirin EC 81 MG tablet Take 81 mg by mouth daily.  Marland Kitchen atorvastatin (LIPITOR) 10 MG tablet Take 0.5 tablets (5 mg total) by mouth daily at 6 PM.  . clopidogrel (PLAVIX) 75 MG tablet Take 1 tablet (75 mg total) by mouth daily with breakfast.  . Multiple Vitamins-Minerals (MULTIVITAMIN PO) Take 1 tablet by mouth daily.  . tamsulosin (FLOMAX) 0.4 MG CAPS capsule Take 1 capsule (0.4 mg total) by mouth every evening.  . triamterene-hydrochlorothiazide (MAXZIDE-25) 37.5-25 MG tablet Take 1 tablet by mouth daily.  . Turmeric 1053 MG TABS Take by mouth.   No facility-administered encounter medications on file as of 01/04/2020.  :   Review of Systems:  Out of a  complete 14 point review of systems, all are reviewed and negative with the exception of these symptoms as listed below:  Review of Systems  Neurological:       Here to discuss worsening dizziness spells- pt reports when goes from sitting standing the dizziness is worse.  Pt reports 1 fall recently due to the dizziness- he also reports shaking when he feels dizzy.     Objective:  Neurological Exam  Physical Exam Physical Examination:   Vitals:   01/04/20 1054 01/04/20 1059  SpO2: 97% 97%    Sitting 146/81, P 89, standing 152/77, P 103.  Denies any orthostatic lightheadedness, denies spinning sensation.  General Examination: The patient is a very pleasant 86 y.o. male in no acute distress. He appears well-developed and well-nourished and well groomed.   HEENT: Normocephalic, atraumatic, pupils are equal, round and reactive to light and accommodation. Funduscopic exam is normal, status post bilateral cataract repairs, tympanic membranes are clear bilaterally.  He has no vertiginous symptoms with sudden head position changes, denies any orthostatic symptoms currently.  Face is symmetric with normal facial animation,  hearing is very impaired and communication is therefore difficult but he does not have any dysarthria, voice tremor, or comprehension issues otherwise.  Oropharynx exam reveals: moderate mouth dryness, dentures on top, tongue protrudes centrally in palate elevates symmetrically, right anterior neck scar, unremarkable.  No carotid bruits.   Chest: Clear to auscultation without wheezing, rhonchi or crackles noted.  Heart: S1+S2+0, regular and normal without murmurs, rubs or gallops noted.   Abdomen: Soft, non-tender and non-distended with normal bowel sounds appreciated on auscultation.  Extremities: There is no pitting edema in the distal lower extremities bilaterally.  Skin: Warm and dry without trophic changes noted.  Musculoskeletal: exam reveals no obvious joint deformities, tenderness or joint swelling or erythema.   Neurologically:  Mental status: The patient is awake, alert and oriented in all 4 spheres. His immediate and remote memory, attention, language skills and fund of knowledge are appropriate. There is no evidence of aphasia, agnosia, apraxia or anomia. Speech is clear with normal prosody and enunciation. Thought process is linear. Mood is normal and affect is normal.  Cranial nerves II - XII are as described above under HEENT exam. In addition: shoulder shrug is normal with equal shoulder height noted. Motor exam: Normal bulk, strength and tone is noted. There is no drift, tremor or rebound. Romberg is not tested for safety concerns, reflexes are 1+ in the upper extremities, trace in the knees and absent in the ankles.  Fine motor skills are grossly intact with hand movements and foot movements.  He stands up with no significant difficulty, stand slightly narrow based, walks without a walking aid, posture is mildly stooped but appropriate for age, no shuffling noted, preserved arm swing noted.  Cerebellar testing: No dysmetria or intention tremor. There is no truncal or gait  ataxia.  Sensory exam: intact to light touch in the upper and lower extremities.   Assessment and Plan:  Assessment and Plan:  In summary, Vear ClockBobby L Sugarman is a very pleasant 86 y.o.-year old male with an underlying medical history of stroke, carotid artery stenosis, hypertension, hyperlipidemia, cervical disc disease, BPH, arthritis, aortic regurgitation, and mildly overweight state, who presents for evaluation of his dizziness, orthostatic lightheadedness reported.  His history and examination are not in keeping with vertigo.  He does report intermittent symptoms of orthostatic lightheadedness, has checked his blood pressure at home and reports that he feels bad when his  numbers are in the 110s for the top numbers and sometimes they are in the 50s for the diastolic numbers.  He is encouraged to talk to you about blood pressure management and consider seeing a cardiologist if need be.  I do not believe there is a primary neurological cause of his symptoms, his symptomatology is not in keeping with TIA or stroke or cerebellar dysfunction, no parkinsonism noted.  I doubt that this is true vertigo although he does have ringing in the ears sometimes, may benefit from seeing ENT at some point.  He does have significant hearing loss and is encouraged to use his hearing aids.  He is advised to use his walker at all times for gait safety and to always stand up slowly, get his bearings first, stay really well-hydrated with water.  I do not have any concern for an underlying structural cause of his symptoms and offered to order a brain MRI but we mutually agreed to hold off at this time.  He denies any strokelike symptoms and reports that when he had a stroke in the past his symptoms were very different.  He is advised at this juncture to follow-up with you and discuss further steps for him.  He is advised that he can be seen in this clinic as needed.  I answered all their questions today and the patient and his daughter  Liborio Nixon were in agreement.   Thank you very much for allowing me to participate in the care of this nice patient. If I can be of any further assistance to you please do not hesitate to call me at [phone].  Sincerely,   Huston Foley, MD, PhD

## 2020-01-04 NOTE — Patient Instructions (Addendum)
It was nice to meet you today. I do not believe there is a primary neurological cause of your symptoms, which do not sound like a mini stroke or stroke and your symptoms are not in keeping with vertigo.  I doubt that this is this is an inner ear problem.  I do believe you will benefit from using your hearing aids. The way you are describing your symptoms you likely have blood pressure drop when you stand up too quickly.  I would recommend that you go back to your primary care physician for blood pressure management and consider seeing a cardiologist. I would be happy to see you back if needed. Please stay well-hydrated with water, 6 to 8 cups of water are recommended daily, 8 ounces each.  Please stand up slowly, get your bearings first, consider using your walker for gait safety at all times.

## 2020-04-22 ENCOUNTER — Other Ambulatory Visit: Payer: Self-pay | Admitting: Family Medicine

## 2020-04-22 DIAGNOSIS — E782 Mixed hyperlipidemia: Secondary | ICD-10-CM

## 2020-04-22 DIAGNOSIS — R351 Nocturia: Secondary | ICD-10-CM

## 2020-04-22 DIAGNOSIS — N401 Enlarged prostate with lower urinary tract symptoms: Secondary | ICD-10-CM

## 2020-04-22 DIAGNOSIS — Z8673 Personal history of transient ischemic attack (TIA), and cerebral infarction without residual deficits: Secondary | ICD-10-CM

## 2020-04-23 NOTE — Telephone Encounter (Signed)
[facility]. NTBS 6 mos was to be Sept. Mail order not sent

## 2020-04-25 NOTE — Telephone Encounter (Signed)
Patient aware he ntbs to get medication refills.

## 2020-05-08 ENCOUNTER — Ambulatory Visit (INDEPENDENT_AMBULATORY_CARE_PROVIDER_SITE_OTHER): Payer: Medicare Other | Admitting: Nurse Practitioner

## 2020-05-08 ENCOUNTER — Other Ambulatory Visit: Payer: Self-pay

## 2020-05-08 ENCOUNTER — Encounter: Payer: Self-pay | Admitting: Nurse Practitioner

## 2020-05-08 VITALS — BP 168/72 | HR 81 | Temp 98.0°F | Resp 20 | Ht 65.0 in | Wt 171.0 lb

## 2020-05-08 DIAGNOSIS — Z8673 Personal history of transient ischemic attack (TIA), and cerebral infarction without residual deficits: Secondary | ICD-10-CM | POA: Diagnosis not present

## 2020-05-08 DIAGNOSIS — E782 Mixed hyperlipidemia: Secondary | ICD-10-CM

## 2020-05-08 DIAGNOSIS — R030 Elevated blood-pressure reading, without diagnosis of hypertension: Secondary | ICD-10-CM | POA: Diagnosis not present

## 2020-05-08 DIAGNOSIS — I10 Essential (primary) hypertension: Secondary | ICD-10-CM | POA: Diagnosis not present

## 2020-05-08 DIAGNOSIS — N401 Enlarged prostate with lower urinary tract symptoms: Secondary | ICD-10-CM

## 2020-05-08 DIAGNOSIS — R351 Nocturia: Secondary | ICD-10-CM

## 2020-05-08 LAB — CBC WITH DIFFERENTIAL/PLATELET
Basophils Absolute: 0 10*3/uL (ref 0.0–0.2)
Basos: 1 %
EOS (ABSOLUTE): 0.2 10*3/uL (ref 0.0–0.4)
Eos: 3 %
Hematocrit: 39 % (ref 37.5–51.0)
Hemoglobin: 12.9 g/dL — ABNORMAL LOW (ref 13.0–17.7)
Immature Grans (Abs): 0 10*3/uL (ref 0.0–0.1)
Immature Granulocytes: 0 %
Lymphocytes Absolute: 2.2 10*3/uL (ref 0.7–3.1)
Lymphs: 29 %
MCH: 30.9 pg (ref 26.6–33.0)
MCHC: 33.1 g/dL (ref 31.5–35.7)
MCV: 93 fL (ref 79–97)
Monocytes Absolute: 0.7 10*3/uL (ref 0.1–0.9)
Monocytes: 9 %
Neutrophils Absolute: 4.5 10*3/uL (ref 1.4–7.0)
Neutrophils: 58 %
Platelets: 237 10*3/uL (ref 150–450)
RBC: 4.18 x10E6/uL (ref 4.14–5.80)
RDW: 11.8 % (ref 11.6–15.4)
WBC: 7.7 10*3/uL (ref 3.4–10.8)

## 2020-05-08 LAB — COMPREHENSIVE METABOLIC PANEL
ALT: 14 IU/L (ref 0–44)
AST: 28 IU/L (ref 0–40)
Albumin/Globulin Ratio: 1.9 (ref 1.2–2.2)
Albumin: 4.3 g/dL (ref 3.5–4.6)
Alkaline Phosphatase: 75 IU/L (ref 44–121)
BUN/Creatinine Ratio: 17 (ref 10–24)
BUN: 17 mg/dL (ref 10–36)
Bilirubin Total: 0.4 mg/dL (ref 0.0–1.2)
CO2: 24 mmol/L (ref 20–29)
Calcium: 9.2 mg/dL (ref 8.6–10.2)
Chloride: 103 mmol/L (ref 96–106)
Creatinine, Ser: 1.02 mg/dL (ref 0.76–1.27)
GFR calc Af Amer: 73 mL/min/{1.73_m2} (ref >59)
GFR calc non Af Amer: 64 mL/min/{1.73_m2} (ref >59)
Globulin, Total: 2.3 g/dL (ref 1.5–4.5)
Glucose: 90 mg/dL (ref 65–99)
Potassium: 4.7 mmol/L (ref 3.5–5.2)
Sodium: 140 mmol/L (ref 134–144)
Total Protein: 6.6 g/dL (ref 6.0–8.5)

## 2020-05-08 LAB — LIPID PANEL
Chol/HDL Ratio: 2.2 ratio (ref 0.0–5.0)
Cholesterol, Total: 123 mg/dL (ref 100–199)
HDL: 57 mg/dL (ref >39)
LDL Chol Calc (NIH): 49 mg/dL (ref 0–99)
Triglycerides: 90 mg/dL (ref 0–149)
VLDL Cholesterol Cal: 17 mg/dL (ref 5–40)

## 2020-05-08 MED ORDER — TAMSULOSIN HCL 0.4 MG PO CAPS: 0.4000 mg | ORAL_CAPSULE | Freq: Every evening | ORAL | 2 refills | Status: DC

## 2020-05-08 MED ORDER — ATORVASTATIN CALCIUM 10 MG PO TABS: 5.0000 mg | ORAL_TABLET | Freq: Every day | ORAL | 2 refills | Status: DC

## 2020-05-08 MED ORDER — TRIAMTERENE-HCTZ 37.5-25 MG PO TABS: 1.0000 | ORAL_TABLET | Freq: Every day | ORAL | 2 refills | Status: DC

## 2020-05-08 MED ORDER — CLOPIDOGREL BISULFATE 75 MG PO TABS: 75.0000 mg | ORAL_TABLET | Freq: Every day | ORAL | 2 refills | Status: DC

## 2020-05-08 NOTE — Progress Notes (Signed)
Established Patient Office Visit  Subjective:  Patient ID: Paul Bradshaw, male    DOB: October 02, 1925  Age: 86 y.o. MRN: [id number]  CC:  Chief Complaint  Patient presents with  . Medical Management of Chronic Issues    6 mo   . Hyperlipidemia  . Hypertension    HPI Paul Bradshaw presents for Hyperlipidemia This is a chronic problem. The current episode started more than 1 year ago. The problem is controlled. Recent lipid tests were reviewed and are normal. He has no history of diabetes or hypothyroidism. Pertinent negatives include no chest pain, leg pain or shortness of breath. Current antihyperlipidemic treatment includes diet change, exercise and statins. The current treatment provides significant improvement of lipids. There are no compliance problems.  Risk factors for coronary artery disease include male sex and hypertension.  Hypertension This is a chronic problem. The current episode started more than 1 year ago. The problem has been gradually improving since onset. The problem is controlled. Pertinent negatives include no chest pain, headaches, malaise/fatigue or shortness of breath. There are no associated agents to hypertension. Risk factors for coronary artery disease include male gender and dyslipidemia. Past treatments include diuretics. The current treatment provides mild improvement. There are no compliance problems.     Past Medical History:  Diagnosis Date  . Aortic regurgitation    Moderate  . Arthritis   . BPH (benign prostatic hyperplasia)   . Cervical disc disease   . Cervical radiculopathy   . Hyperlipidemia   . Hypertension   . Staphylococcus aureus bacteremia 08/09/2012   TEE negative for vegetation or thrombus February 2014  . Stroke [facility]) 2005 or 2006   3  . Symptomatic carotid artery stenosis with infarction [facility]) 2005 or 2006   Status post right carotid endarterectomy    Past Surgical History:  Procedure Laterality Date  . BACK SURGERY    . CAROTID  ENDARTERECTOMY    . CATARACT EXTRACTION W/PHACO Left 02/15/2015   Procedure: CATARACT EXTRACTION PHACO AND INTRAOCULAR LENS PLACEMENT LEFT EYE CDE=30.93;  Surgeon: Gemma Payor, MD;  Location: [facility];  Service: Ophthalmology;  Laterality: Left;  . CHOLECYSTECTOMY    . KIDNEY STONE SURGERY    . LUMBAR LAMINECTOMY/DECOMPRESSION MICRODISCECTOMY Bilateral 01/23/2014   Procedure: LUMBAR LAMINECTOMY/DECOMPRESSION MICRODISCECTOMY 1 LEVEL L5-S1;  Surgeon: Temple Pacini, MD;  Location: [facility];  Service: Neurosurgery;  Laterality: Bilateral;  LUMBAR LAMINECTOMY/DECOMPRESSION MICRODISCECTOMY 1 LEVEL L5-S1  . TEE WITHOUT CARDIOVERSION N/A 08/12/2012   Procedure: TRANSESOPHAGEAL ECHOCARDIOGRAM (TEE);  Surgeon: Wendall Stade, MD;  Location: [facility];  Service: Cardiovascular;  Laterality: N/A;  . TEE WITHOUT CARDIOVERSION N/A 06/24/2013   Procedure: TRANSESOPHAGEAL ECHOCARDIOGRAM (TEE);  Surgeon: Jonelle Sidle, MD;  Location: [facility];  Service: Endoscopy;  Laterality: N/A;    Family History  Problem Relation Age of Onset  . Emphysema Father   . Alzheimer's disease Mother   . Heart disease Brother   . Heart attack Brother   . COPD Son       Outpatient Medications Prior to Visit  Medication Sig Dispense Refill  . acetaminophen (TYLENOL) 500 MG tablet Take 1,000 mg by mouth every 8 (eight) hours as needed for moderate pain.    Marland Kitchen aspirin EC 81 MG tablet Take 81 mg by mouth daily.    Marland Kitchen atorvastatin (LIPITOR) 10 MG tablet Take 0.5 tablets (5 mg total) by mouth daily at 6 PM. 45 tablet 2  . clopidogrel (PLAVIX) 75 MG tablet  Take 1 tablet (75 mg total) by mouth daily with breakfast. 90 tablet 2  . Multiple Vitamins-Minerals (MULTIVITAMIN PO) Take 1 tablet by mouth daily.    . tamsulosin (FLOMAX) 0.4 MG CAPS capsule Take 1 capsule (0.4 mg total) by mouth every evening. 90 capsule 2  . triamterene-hydrochlorothiazide (MAXZIDE-25) 37.5-25 MG tablet Take 1 tablet by mouth daily. 30 tablet 2  .  Turmeric 1053 MG TABS Take by mouth.     No facility-administered medications prior to visit.    Allergies  Allergen Reactions  . Amlodipine Swelling    Swelling of lips.  . Lisinopril Other (See Comments)    Elevated BP higher & causes a burning feeling on the inside.     ROS Review of Systems  Constitutional: Negative for malaise/fatigue.  Respiratory: Negative for shortness of breath.   Cardiovascular: Negative for chest pain.  Neurological: Negative for dizziness, light-headedness and headaches.  All other systems reviewed and are negative.     Objective:    Physical Exam Vitals reviewed.  HENT:     Head: Normocephalic.     Nose: Nose normal.  Eyes:     Conjunctiva/sclera: Conjunctivae normal.  Cardiovascular:     Rate and Rhythm: Normal rate and regular rhythm.     Pulses: Normal pulses.     Heart sounds: Normal heart sounds.  Pulmonary:     Effort: Pulmonary effort is normal.     Breath sounds: Normal breath sounds.  Abdominal:     General: Bowel sounds are normal.  Musculoskeletal:        General: Normal range of motion.  Skin:    General: Skin is warm.  Neurological:     Mental Status: He is alert and oriented to person, place, and time.  Psychiatric:        Mood and Affect: Mood normal.        Behavior: Behavior normal.     Resp 20   Ht 5\' 5"  (1.651 m)   Wt 171 lb (77.6 kg)   BMI 28.46 kg/m  Wt Readings from Last 3 Encounters:  05/08/20 171 lb (77.6 kg)  01/04/20 167 lb 5 oz (75.9 kg)  12/23/19 165 lb (74.8 kg)     There are no preventive care reminders to display for this patient.  There are no preventive care reminders to display for this patient.  Lab Results  Component Value Date   TSH 2.508 06/21/2013   Lab Results  Component Value Date   WBC 6.3 08/26/2019   HGB 13.7 08/26/2019   HCT 41.7 08/26/2019   MCV 95 08/26/2019   PLT 201 08/26/2019   Lab Results  Component Value Date   NA 141 08/26/2019   K CANCELED 08/26/2019     CO2 23 08/26/2019   GLUCOSE CANCELED 08/26/2019   BUN 16 08/26/2019   CREATININE 0.97 08/26/2019   BILITOT 0.4 08/26/2019   ALKPHOS 87 08/26/2019   AST 23 08/26/2019   ALT 10 08/26/2019   PROT 6.7 08/26/2019   ALBUMIN 4.4 08/26/2019   CALCIUM 8.9 08/26/2019   ANIONGAP 9 05/09/2017   Lab Results  Component Value Date   CHOL 120 08/26/2019   Lab Results  Component Value Date   HDL 57 08/26/2019   Lab Results  Component Value Date   LDLCALC 44 08/26/2019   Lab Results  Component Value Date   TRIG 106 08/26/2019   Lab Results  Component Value Date   CHOLHDL 2.1 08/26/2019   Lab Results  Component Value Date   HGBA1C 5.7 (H) 06/22/2013      Assessment & Plan:   Problem List Items Addressed This Visit      Cardiovascular and Mediastinum   White coat syndrome without hypertension    Uncontrolled not well managed on current medication.  Patient reports this is normal for him and would not like to make any adjustments to medication dose.  Patient reports low blood pressure causes dizziness and headache.  Patient reports blood pressure numbers are high in clinic due to whitecoat syndrome.  Continue to provide education to patient.  Advise healthy diet and exercise regimen as tolerated.  Follow-up in 6 months.  Completed lab-CBC, CMP, lipid panel.      Relevant Medications   atorvastatin (LIPITOR) 10 MG tablet   triamterene-hydrochlorothiazide (MAXZIDE-25) 37.5-25 MG tablet     Genitourinary   BPH (benign prostatic hyperplasia)   Relevant Medications   tamsulosin (FLOMAX) 0.4 MG CAPS capsule     Other   History of CVA (cerebrovascular accident)   Relevant Medications   atorvastatin (LIPITOR) 10 MG tablet   clopidogrel (PLAVIX) 75 MG tablet   Mixed hyperlipidemia - Primary    Next hyperlipidemia well-controlled on current medication no changes to medication dose.  Continue to provide education to patient with printed handouts given.  Continue healthy diet and  exercise regimen as tolerated.  Rx refill sent to pharmacy.  Follow-up in 6 months.      Relevant Medications   atorvastatin (LIPITOR) 10 MG tablet   triamterene-hydrochlorothiazide (MAXZIDE-25) 37.5-25 MG tablet   Other Relevant Orders   CBC with Differential   Comprehensive metabolic panel   Lipid Panel    Other Visit Diagnoses    Essential hypertension       Relevant Medications   atorvastatin (LIPITOR) 10 MG tablet   triamterene-hydrochlorothiazide (MAXZIDE-25) 37.5-25 MG tablet      Meds ordered this encounter  Medications  . atorvastatin (LIPITOR) 10 MG tablet    Sig: Take 0.5 tablets (5 mg total) by mouth daily at 6 PM.    Dispense:  45 tablet    Refill:  2    Order Specific Question:   Supervising Provider    Answer:   Arville Care A [id number]  . clopidogrel (PLAVIX) 75 MG tablet    Sig: Take 1 tablet (75 mg total) by mouth daily with breakfast.    Dispense:  90 tablet    Refill:  2    Order Specific Question:   Supervising Provider    Answer:   Arville Care A [id number]  . tamsulosin (FLOMAX) 0.4 MG CAPS capsule    Sig: Take 1 capsule (0.4 mg total) by mouth every evening.    Dispense:  90 capsule    Refill:  2    Order Specific Question:   Supervising Provider    Answer:   Arville Care A [id number]  . triamterene-hydrochlorothiazide (MAXZIDE-25) 37.5-25 MG tablet    Sig: Take 1 tablet by mouth daily.    Dispense:  30 tablet    Refill:  2    Order Specific Question:   Supervising Provider    Answer:   Arville Care A [id number]    Follow-up: Return in about 6 months (around 11/05/2020).    Daryll Drown, NP

## 2020-05-08 NOTE — Assessment & Plan Note (Signed)
Uncontrolled not well managed on current medication.  Patient reports this is normal for him and would not like to make any adjustments to medication dose.  Patient reports low blood pressure causes dizziness and headache.  Patient reports blood pressure numbers are high in clinic due to whitecoat syndrome.  Continue to provide education to patient.  Advise healthy diet and exercise regimen as tolerated.  Follow-up in 6 months.  Completed lab-CBC, CMP, lipid panel.

## 2020-05-08 NOTE — Patient Instructions (Addendum)
Patient is well controlled on current medication no changes necessary.  Continue to maintain a healthy diet and exercise regimen as tolerated.  Follow-up in 6 months.  Labs completed today CBC, CMP, lipid panel, results pending.  Rx refill sent to pharmacy.   High Cholesterol  High cholesterol is a condition in which the blood has high levels of a white, waxy, fat-like substance (cholesterol). The human body needs small amounts of cholesterol. The liver makes all the cholesterol that the body needs. Extra (excess) cholesterol comes from the food that we eat. Cholesterol is carried from the liver by the blood through the blood vessels. If you have high cholesterol, deposits (plaques) may build up on the walls of your blood vessels (arteries). Plaques make the arteries narrower and stiffer. Cholesterol plaques increase your risk for heart attack and stroke. Work with your health care provider to keep your cholesterol levels in a healthy range. What increases the risk? This condition is more likely to develop in people who:  Eat foods that are high in animal fat (saturated fat) or cholesterol.  Are overweight.  Are not getting enough exercise.  Have a family history of high cholesterol. What are the signs or symptoms? There are no symptoms of this condition. How is this diagnosed? This condition may be diagnosed from the results of a blood test.  If you are older than age 34, your health care provider may check your cholesterol every 4-6 years.  You may be checked more often if you already have high cholesterol or other risk factors for heart disease. The blood test for cholesterol measures:  "Bad" cholesterol (LDL cholesterol). This is the main type of cholesterol that causes heart disease. The desired level for LDL is less than 100.  "Good" cholesterol (HDL cholesterol). This type helps to protect against heart disease by cleaning the arteries and carrying the LDL away. The desired level  for HDL is 60 or higher.  Triglycerides. These are fats that the body can store or burn for energy. The desired number for triglycerides is lower than 150.  Total cholesterol. This is a measure of the total amount of cholesterol in your blood, including LDL cholesterol, HDL cholesterol, and triglycerides. A healthy number is less than 200. How is this treated? This condition is treated with diet changes, lifestyle changes, and medicines. Diet changes  This may include eating more whole grains, fruits, vegetables, nuts, and fish.  This may also include cutting back on red meat and foods that have a lot of added sugar. Lifestyle changes  Changes may include getting at least 40 minutes of aerobic exercise 3 times a week. Aerobic exercises include walking, biking, and swimming. Aerobic exercise along with a healthy diet can help you maintain a healthy weight.  Changes may also include quitting smoking. Medicines  Medicines are usually given if diet and lifestyle changes have failed to reduce your cholesterol to healthy levels.  Your health care provider may prescribe a statin medicine. Statin medicines have been shown to reduce cholesterol, which can reduce the risk of heart disease. Follow these instructions at home: Eating and drinking If told by your health care provider:  Eat chicken (without skin), fish, veal, shellfish, ground [place] breast, and round or loin cuts of red meat.  Do not eat fried foods or fatty meats, such as hot dogs and salami.  Eat plenty of fruits, such as apples.  Eat plenty of vegetables, such as broccoli, potatoes, and carrots.  Eat beans, peas, and  lentils.  Eat grains such as barley, rice, couscous, and bulgur wheat.  Eat pasta without cream sauces.  Use skim or nonfat milk, and eat low-fat or nonfat yogurt and cheeses.  Do not eat or drink whole milk, cream, ice cream, egg yolks, or hard cheeses.  Do not eat stick margarine or tub margarines that  contain trans fats (also called partially hydrogenated oils).  Do not eat saturated tropical oils, such as coconut oil and palm oil.  Do not eat cakes, cookies, crackers, or other baked goods that contain trans fats.  General instructions  Exercise as directed by your health care provider. Increase your activity level with activities such as gardening, walking, and taking the stairs.  Take over-the-counter and prescription medicines only as told by your health care provider.  Do not use any products that contain nicotine or tobacco, such as cigarettes and e-cigarettes. If you need help quitting, ask your health care provider.  Keep all follow-up visits as told by your health care provider. This is important. Contact a health care provider if:  You are struggling to maintain a healthy diet or weight.  You need help to start on an exercise program.  You need help to stop smoking. Get help right away if:  You have chest pain.  You have trouble breathing. This information is not intended to replace advice given to you by your health care provider. Make sure you discuss any questions you have with your health care provider. Document Revised: 06/12/2017 Document Reviewed: 12/08/2015 Elsevier Patient Education  2020 [employer]. Hypertension, Adult Hypertension is another name for high blood pressure. High blood pressure forces your heart to work harder to pump blood. This can cause problems over time. There are two numbers in a blood pressure reading. There is a top number (systolic) over a bottom number (diastolic). It is best to have a blood pressure that is below 120/80. Healthy choices can help lower your blood pressure, or you may need medicine to help lower it. What are the causes? The cause of this condition is not known. Some conditions may be related to high blood pressure. What increases the risk?  Smoking.  Having type 2 diabetes mellitus, high cholesterol, or  both.  Not getting enough exercise or physical activity.  Being overweight.  Having too much fat, sugar, calories, or salt (sodium) in your diet.  Drinking too much alcohol.  Having long-term (chronic) kidney disease.  Having a family history of high blood pressure.  Age. Risk increases with age.  Race. You may be at higher risk if you are African American.  Gender. Men are at higher risk than women before age 70. After age 13, women are at higher risk than men.  Having obstructive sleep apnea.  Stress. What are the signs or symptoms?  High blood pressure may not cause symptoms. Very high blood pressure (hypertensive crisis) may cause: ? Headache. ? Feelings of worry or nervousness (anxiety). ? Shortness of breath. ? Nosebleed. ? A feeling of being sick to your stomach (nausea). ? Throwing up (vomiting). ? Changes in how you see. ? Very bad chest pain. ? Seizures. How is this treated?  This condition is treated by making healthy lifestyle changes, such as: ? Eating healthy foods. ? Exercising more. ? Drinking less alcohol.  Your health care provider may prescribe medicine if lifestyle changes are not enough to get your blood pressure under control, and if: ? Your top number is above 130. ? Your bottom number  is above 80.  Your personal target blood pressure may vary. Follow these instructions at home: Eating and drinking   If told, follow the DASH eating plan. To follow this plan: ? Fill one half of your plate at each meal with fruits and vegetables. ? Fill one fourth of your plate at each meal with whole grains. Whole grains include whole-wheat pasta, brown rice, and whole-grain bread. ? Eat or drink low-fat dairy products, such as skim milk or low-fat yogurt. ? Fill one fourth of your plate at each meal with low-fat (lean) proteins. Low-fat proteins include fish, chicken without skin, eggs, beans, and tofu. ? Avoid fatty meat, cured and processed meat, or  chicken with skin. ? Avoid pre-made or processed food.  Eat less than 1,500 mg of salt each day.  Do not drink alcohol if: ? Your doctor tells you not to drink. ? You are pregnant, may be pregnant, or are planning to become pregnant.  If you drink alcohol: ? Limit how much you use to:  0-1 drink a day for women.  0-2 drinks a day for men. ? Be aware of how much alcohol is in your drink. In the [place] one drink equals one 12 oz bottle of beer (355 mL), one 5 oz glass of wine (148 mL), or one 1 oz glass of hard liquor (44 mL). Lifestyle   Work with your doctor to stay at a healthy weight or to lose weight. Ask your doctor what the best weight is for you.  Get at least 30 minutes of exercise most days of the week. This may include walking, swimming, or biking.  Get at least 30 minutes of exercise that strengthens your muscles (resistance exercise) at least 3 days a week. This may include lifting weights or doing Pilates.  Do not use any products that contain nicotine or tobacco, such as cigarettes, e-cigarettes, and chewing tobacco. If you need help quitting, ask your doctor.  Check your blood pressure at home as told by your doctor.  Keep all follow-up visits as told by your doctor. This is important. Medicines  Take over-the-counter and prescription medicines only as told by your doctor. Follow directions carefully.  Do not skip doses of blood pressure medicine. The medicine does not work as well if you skip doses. Skipping doses also puts you at risk for problems.  Ask your doctor about side effects or reactions to medicines that you should watch for. Contact a doctor if you:  Think you are having a reaction to the medicine you are taking.  Have headaches that keep coming back (recurring).  Feel dizzy.  Have swelling in your ankles.  Have trouble with your vision. Get help right away if you:  Get a very bad headache.  Start to feel mixed up (confused).  Feel weak  or numb.  Feel faint.  Have very bad pain in your: ? Chest. ? Belly (abdomen).  Throw up more than once.  Have trouble breathing. Summary  Hypertension is another name for high blood pressure.  High blood pressure forces your heart to work harder to pump blood.  For most people, a normal blood pressure is less than 120/80.  Making healthy choices can help lower blood pressure. If your blood pressure does not get lower with healthy choices, you may need to take medicine. This information is not intended to replace advice given to you by your health care provider. Make sure you discuss any questions you have with your health care  provider. Document Revised: 02/17/2018 Document Reviewed: 02/17/2018 Elsevier Patient Education  2020 [employer]

## 2020-05-08 NOTE — Assessment & Plan Note (Signed)
Next hyperlipidemia well-controlled on current medication no changes to medication dose.  Continue to provide education to patient with printed handouts given.  Continue healthy diet and exercise regimen as tolerated.  Rx refill sent to pharmacy.  Follow-up in 6 months.

## 2020-05-15 DIAGNOSIS — H26492 Other secondary cataract, left eye: Secondary | ICD-10-CM | POA: Diagnosis not present

## 2020-10-09 DIAGNOSIS — H40033 Anatomical narrow angle, bilateral: Secondary | ICD-10-CM | POA: Diagnosis not present

## 2020-10-09 DIAGNOSIS — H04123 Dry eye syndrome of bilateral lacrimal glands: Secondary | ICD-10-CM | POA: Diagnosis not present

## 2020-10-26 DIAGNOSIS — H04123 Dry eye syndrome of bilateral lacrimal glands: Secondary | ICD-10-CM | POA: Diagnosis not present

## 2021-01-16 ENCOUNTER — Other Ambulatory Visit: Payer: Self-pay | Admitting: *Deleted

## 2021-01-16 DIAGNOSIS — N401 Enlarged prostate with lower urinary tract symptoms: Secondary | ICD-10-CM

## 2021-01-16 DIAGNOSIS — Z8673 Personal history of transient ischemic attack (TIA), and cerebral infarction without residual deficits: Secondary | ICD-10-CM

## 2021-01-16 DIAGNOSIS — E782 Mixed hyperlipidemia: Secondary | ICD-10-CM

## 2021-01-16 DIAGNOSIS — R351 Nocturia: Secondary | ICD-10-CM

## 2021-01-16 NOTE — Telephone Encounter (Signed)
Britney. NTBS 6 mos ckup was to be in May. Mail order not sent

## 2021-02-01 ENCOUNTER — Other Ambulatory Visit: Payer: Self-pay | Admitting: *Deleted

## 2021-02-01 DIAGNOSIS — N401 Enlarged prostate with lower urinary tract symptoms: Secondary | ICD-10-CM

## 2021-02-01 DIAGNOSIS — Z8673 Personal history of transient ischemic attack (TIA), and cerebral infarction without residual deficits: Secondary | ICD-10-CM

## 2021-02-01 DIAGNOSIS — E782 Mixed hyperlipidemia: Secondary | ICD-10-CM

## 2021-02-01 DIAGNOSIS — R351 Nocturia: Secondary | ICD-10-CM

## 2021-02-01 MED ORDER — CLOPIDOGREL BISULFATE 75 MG PO TABS: 75.0000 mg | ORAL_TABLET | Freq: Every day | ORAL | 0 refills | Status: DC

## 2021-02-01 MED ORDER — ATORVASTATIN CALCIUM 10 MG PO TABS: 5.0000 mg | ORAL_TABLET | Freq: Every day | ORAL | 0 refills | Status: DC

## 2021-02-01 MED ORDER — TAMSULOSIN HCL 0.4 MG PO CAPS: 0.4000 mg | ORAL_CAPSULE | Freq: Every evening | ORAL | 0 refills | Status: DC

## 2021-02-13 ENCOUNTER — Ambulatory Visit (INDEPENDENT_AMBULATORY_CARE_PROVIDER_SITE_OTHER): Payer: Medicare Other | Admitting: Family Medicine

## 2021-02-13 ENCOUNTER — Other Ambulatory Visit: Payer: Self-pay

## 2021-02-13 ENCOUNTER — Encounter: Payer: Self-pay | Admitting: Family Medicine

## 2021-02-13 VITALS — BP 160/82 | HR 103 | Temp 98.0°F | Ht 65.0 in | Wt 165.2 lb

## 2021-02-13 DIAGNOSIS — R739 Hyperglycemia, unspecified: Secondary | ICD-10-CM | POA: Diagnosis not present

## 2021-02-13 DIAGNOSIS — E782 Mixed hyperlipidemia: Secondary | ICD-10-CM | POA: Diagnosis not present

## 2021-02-13 DIAGNOSIS — Z711 Person with feared health complaint in whom no diagnosis is made: Secondary | ICD-10-CM

## 2021-02-13 DIAGNOSIS — I10 Essential (primary) hypertension: Secondary | ICD-10-CM

## 2021-02-13 DIAGNOSIS — J302 Other seasonal allergic rhinitis: Secondary | ICD-10-CM | POA: Diagnosis not present

## 2021-02-13 NOTE — Patient Instructions (Addendum)
Start taking daily antihistamine for the drainage and morning cough.   Reminder: please return after mid-September for your flu shot. If you receive this elsewhere, such as your pharmacy, please let us know so we can get this documented in your chart.

## 2021-02-13 NOTE — Progress Notes (Signed)
Assessment & Plan:  1. Essential hypertension - continue with healthy diet and exercise - unagreeable to taking other antihypertensives - CBC with Differential/Platelet - CMP14+EGFR - Lipid panel  2. Mixed hyperlipidemia -Well controlled on current regimen - CMP14+EGFR - Lipid panel  3. Seasonal allergies - encouraged to take OTC antihistamine prn  4. Feared complaint without diagnosis - evaluated site on the back of his head he was concerned with, no signs or symptoms of infection that warrant lab investigation.    Return in about 6 months (around 08/16/2021) for annual physical.  Lucile Crater, NP Student  Subjective:    Patient ID: Paul Bradshaw, male    DOB: 11/25/1925, 87 y.o.   MRN: [id number]  Patient Care Team: Loman Brooklyn, FNP as PCP - General (Family Medicine)   Chief Complaint:  Chief Complaint  Patient presents with   Hyperlipidemia   Hypertension    Check up of chronic medical conditions. Patient would like blood work to make sure he does not have blood infection.     HPI: Paul Bradshaw is a 87 y.o. male presenting on 02/13/2021 for Hyperlipidemia and Hypertension (Check up of chronic medical conditions. Patient would like blood work to make sure he does not have blood infection. )  Hypertension: He has not been taking anything for blood pressure due to intolerance. He has not been checking his blood pressure at home since his last visit.  Hyperlipidemia: He has been taking half tablet atorvastatin daily. He is tolerating it well.  New complaints: He recently had a large nodule on the back of his neck that "looked ready to pop," so his daughter-in-law popped it and yellow pus and blood came out. He kept it clean and it feels better. He asked to have it evaluated to see is he has a blood infection from it, because he had one in the past due to similar circumstances.  He states he wakes in the morning with congestion and coughs up "clear white stuff."  He says he takes an antihistamine and it helps. He states that the cough and congestion improve as the day goes on.  Social history:  Relevant past medical, surgical, family and social history reviewed and updated as indicated. Interim medical history since our last visit reviewed.  Allergies and medications reviewed and updated.  DATA REVIEWED: CHART IN EPIC  ROS: Negative unless specifically indicated above in HPI.    Current Outpatient Medications:    acetaminophen (TYLENOL) 500 MG tablet, Take 1,000 mg by mouth every 8 (eight) hours as needed for moderate pain., Disp: , Rfl:    atorvastatin (LIPITOR) 10 MG tablet, Take 0.5 tablets (5 mg total) by mouth daily at 6 PM., Disp: 45 tablet, Rfl: 0   clopidogrel (PLAVIX) 75 MG tablet, Take 1 tablet (75 mg total) by mouth daily with breakfast., Disp: 90 tablet, Rfl: 0   Multiple Vitamins-Minerals (MULTIVITAMIN PO), Take 1 tablet by mouth daily., Disp: , Rfl:    tamsulosin (FLOMAX) 0.4 MG CAPS capsule, Take 1 capsule (0.4 mg total) by mouth every evening., Disp: 90 capsule, Rfl: 0   Turmeric 1053 MG TABS, Take by mouth., Disp: , Rfl:    Allergies  Allergen Reactions   Amlodipine Swelling    Swelling of lips.   Lisinopril Other (See Comments)    Elevated BP higher & causes a burning feeling on the inside.    Past Medical History:  Diagnosis Date   Aortic regurgitation    Moderate  Arthritis    BPH (benign prostatic hyperplasia)    Cervical disc disease    Cervical radiculopathy    Hyperlipidemia    Hypertension    Staphylococcus aureus bacteremia 08/09/2012   TEE negative for vegetation or thrombus February 2014   Stroke [facility]) 2005 or 2006   3   Symptomatic carotid artery stenosis with infarction [facility]) 2005 or 2006   Status post right carotid endarterectomy    Past Surgical History:  Procedure Laterality Date   BACK SURGERY     CAROTID ENDARTERECTOMY     CATARACT EXTRACTION W/PHACO Left 02/15/2015   Procedure: CATARACT  EXTRACTION PHACO AND INTRAOCULAR LENS PLACEMENT LEFT EYE CDE=30.93;  Surgeon: Tonny Branch, MD;  Location: [facility];  Service: Ophthalmology;  Laterality: Left;   CHOLECYSTECTOMY     KIDNEY STONE SURGERY     LUMBAR LAMINECTOMY/DECOMPRESSION MICRODISCECTOMY Bilateral 01/23/2014   Procedure: LUMBAR LAMINECTOMY/DECOMPRESSION MICRODISCECTOMY 1 LEVEL L5-S1;  Surgeon: Charlie Pitter, MD;  Location: [place] [facility];  Service: Neurosurgery;  Laterality: Bilateral;  LUMBAR LAMINECTOMY/DECOMPRESSION MICRODISCECTOMY 1 LEVEL L5-S1   TEE WITHOUT CARDIOVERSION N/A 08/12/2012   Procedure: TRANSESOPHAGEAL ECHOCARDIOGRAM (TEE);  Surgeon: Josue Hector, MD;  Location: [facility];  Service: Cardiovascular;  Laterality: N/A;   TEE WITHOUT CARDIOVERSION N/A 06/24/2013   Procedure: TRANSESOPHAGEAL ECHOCARDIOGRAM (TEE);  Surgeon: Satira Sark, MD;  Location: [facility];  Service: Endoscopy;  Laterality: N/A;    Social History   Socioeconomic History   Marital status: Married    Spouse name: Not on file   Number of children: Not on file   Years of education: Not on file   Highest education level: Not on file  Occupational History   Not on file  Tobacco Use   Smoking status: Never   Smokeless tobacco: Never  Vaping Use   Vaping Use: Never used  Substance and Sexual Activity   Alcohol use: No   Drug use: No   Sexual activity: Not Currently  Other Topics Concern   Not on file  Social History Narrative   Not on file   Social Determinants of Health   Financial Resource Strain: Not on file  Food Insecurity: Not on file  Transportation Needs: Not on file  Physical Activity: Not on file  Stress: Not on file  Social Connections: Not on file  Intimate Partner Violence: Not on file        Objective:    BP (!) 160/82   Pulse (!) 103   Temp 98 F (36.7 C) (Temporal)   Ht _0  (1.651 m)   Wt 74.9 kg   SpO2 96%   BMI 27.49 kg/m   Wt Readings from Last 3 Encounters:  02/13/21 74.9 kg  05/08/20  77.6 kg  01/04/20 75.9 kg    Physical Exam Vitals reviewed.  Constitutional:      General: He is not in acute distress.    Appearance: Normal appearance. He is not ill-appearing, toxic-appearing or diaphoretic.  HENT:     Head: Normocephalic and atraumatic.  Eyes:     General: No scleral icterus.       Right eye: No discharge.        Left eye: No discharge.     Conjunctiva/sclera: Conjunctivae normal.  Cardiovascular:     Rate and Rhythm: Normal rate and regular rhythm.     Heart sounds: Normal heart sounds. No murmur heard.   No friction rub. No gallop.  Pulmonary:     Effort: Pulmonary  effort is normal. No respiratory distress.     Breath sounds: Normal breath sounds. No stridor. No wheezing, rhonchi or rales.  Musculoskeletal:        General: Normal range of motion.     Cervical back: Normal range of motion.  Skin:    General: Skin is warm and dry.  Neurological:     Mental Status: He is alert and oriented to person, place, and time. Mental status is at baseline.  Psychiatric:        Mood and Affect: Mood normal.        Behavior: Behavior normal.        Thought Content: Thought content normal.        Judgment: Judgment normal.    Lab Results  Component Value Date   TSH 2.508 06/21/2013   Lab Results  Component Value Date   WBC 7.7 05/08/2020   HGB 12.9 (L) 05/08/2020   HCT 39.0 05/08/2020   MCV 93 05/08/2020   PLT 237 05/08/2020   Lab Results  Component Value Date   NA 140 05/08/2020   K 4.7 05/08/2020   CO2 24 05/08/2020   GLUCOSE 90 05/08/2020   BUN 17 05/08/2020   CREATININE 1.02 05/08/2020   BILITOT 0.4 05/08/2020   ALKPHOS 75 05/08/2020   AST 28 05/08/2020   ALT 14 05/08/2020   PROT 6.6 05/08/2020   ALBUMIN 4.3 05/08/2020   CALCIUM 9.2 05/08/2020   ANIONGAP 9 05/09/2017   Lab Results  Component Value Date   CHOL 123 05/08/2020   Lab Results  Component Value Date   HDL 57 05/08/2020   Lab Results  Component Value Date   LDLCALC 49  05/08/2020   Lab Results  Component Value Date   TRIG 90 05/08/2020   Lab Results  Component Value Date   CHOLHDL 2.2 05/08/2020   Lab Results  Component Value Date   HGBA1C 5.7 (H) 06/22/2013

## 2021-02-14 LAB — CMP14+EGFR
ALT: 11 IU/L (ref 0–44)
AST: 23 IU/L (ref 0–40)
Albumin/Globulin Ratio: 2 (ref 1.2–2.2)
Albumin: 4.4 g/dL (ref 3.5–4.6)
Alkaline Phosphatase: 70 IU/L (ref 44–121)
BUN/Creatinine Ratio: 13 (ref 10–24)
BUN: 13 mg/dL (ref 10–36)
Bilirubin Total: 0.5 mg/dL (ref 0.0–1.2)
CO2: 19 mmol/L — ABNORMAL LOW (ref 20–29)
Calcium: 8.6 mg/dL (ref 8.6–10.2)
Chloride: 101 mmol/L (ref 96–106)
Creatinine, Ser: 0.99 mg/dL (ref 0.76–1.27)
Globulin, Total: 2.2 g/dL (ref 1.5–4.5)
Glucose: 183 mg/dL — ABNORMAL HIGH (ref 65–99)
Potassium: 4.2 mmol/L (ref 3.5–5.2)
Sodium: 139 mmol/L (ref 134–144)
Total Protein: 6.6 g/dL (ref 6.0–8.5)
eGFR: 71 mL/min/{1.73_m2} (ref >59)

## 2021-02-14 LAB — CBC WITH DIFFERENTIAL/PLATELET
Basophils Absolute: 0 10*3/uL (ref 0.0–0.2)
Basos: 0 %
EOS (ABSOLUTE): 0.1 10*3/uL (ref 0.0–0.4)
Eos: 1 %
Hematocrit: 38.4 % (ref 37.5–51.0)
Hemoglobin: 13.4 g/dL (ref 13.0–17.7)
Immature Grans (Abs): 0 10*3/uL (ref 0.0–0.1)
Immature Granulocytes: 0 %
Lymphocytes Absolute: 1.1 10*3/uL (ref 0.7–3.1)
Lymphs: 15 %
MCH: 32 pg (ref 26.6–33.0)
MCHC: 34.9 g/dL (ref 31.5–35.7)
MCV: 92 fL (ref 79–97)
Monocytes Absolute: 0.7 10*3/uL (ref 0.1–0.9)
Monocytes: 9 %
Neutrophils Absolute: 5.4 10*3/uL (ref 1.4–7.0)
Neutrophils: 75 %
Platelets: 203 10*3/uL (ref 150–450)
RBC: 4.19 x10E6/uL (ref 4.14–5.80)
RDW: 12 % (ref 11.6–15.4)
WBC: 7.3 10*3/uL (ref 3.4–10.8)

## 2021-02-14 LAB — LIPID PANEL
Chol/HDL Ratio: 2.2 ratio (ref 0.0–5.0)
Cholesterol, Total: 114 mg/dL (ref 100–199)
HDL: 53 mg/dL (ref >39)
LDL Chol Calc (NIH): 43 mg/dL (ref 0–99)
Triglycerides: 92 mg/dL (ref 0–149)
VLDL Cholesterol Cal: 18 mg/dL (ref 5–40)

## 2021-02-15 LAB — HGB A1C W/O EAG: Hgb A1c MFr Bld: 5.8 % — ABNORMAL HIGH (ref 4.8–5.6)

## 2021-02-15 LAB — SPECIMEN STATUS REPORT

## 2021-02-16 ENCOUNTER — Encounter: Payer: Self-pay | Admitting: Family Medicine

## 2021-02-16 DIAGNOSIS — R7303 Prediabetes: Secondary | ICD-10-CM

## 2021-02-16 DIAGNOSIS — Z87898 Personal history of other specified conditions: Secondary | ICD-10-CM | POA: Insufficient documentation

## 2021-02-16 HISTORY — DX: Prediabetes: R73.03

## 2021-02-28 ENCOUNTER — Encounter: Payer: Self-pay | Admitting: *Deleted

## 2021-03-20 ENCOUNTER — Ambulatory Visit: Payer: Medicare Other

## 2021-03-20 ENCOUNTER — Ambulatory Visit (INDEPENDENT_AMBULATORY_CARE_PROVIDER_SITE_OTHER): Payer: Medicare Other

## 2021-03-20 ENCOUNTER — Other Ambulatory Visit: Payer: Self-pay

## 2021-03-20 DIAGNOSIS — Z23 Encounter for immunization: Secondary | ICD-10-CM | POA: Diagnosis not present

## 2021-03-28 ENCOUNTER — Other Ambulatory Visit: Payer: Self-pay | Admitting: Family Medicine

## 2021-03-28 DIAGNOSIS — E782 Mixed hyperlipidemia: Secondary | ICD-10-CM

## 2021-03-28 DIAGNOSIS — N401 Enlarged prostate with lower urinary tract symptoms: Secondary | ICD-10-CM

## 2021-03-28 DIAGNOSIS — Z8673 Personal history of transient ischemic attack (TIA), and cerebral infarction without residual deficits: Secondary | ICD-10-CM

## 2021-04-30 DIAGNOSIS — H5203 Hypermetropia, bilateral: Secondary | ICD-10-CM | POA: Diagnosis not present

## 2021-05-06 ENCOUNTER — Inpatient Hospital Stay (HOSPITAL_COMMUNITY)
Admission: EM | Admit: 2021-05-06 | Discharge: 2021-05-09 | DRG: 872 | Disposition: A | Discharge status: Home Health | Payer: Medicare Other | Attending: Internal Medicine | Admitting: Internal Medicine

## 2021-05-06 ENCOUNTER — Other Ambulatory Visit: Payer: Self-pay

## 2021-05-06 ENCOUNTER — Emergency Department (HOSPITAL_COMMUNITY): Payer: Medicare Other

## 2021-05-06 ENCOUNTER — Encounter (HOSPITAL_COMMUNITY): Payer: Self-pay | Admitting: *Deleted

## 2021-05-06 DIAGNOSIS — E872 Acidosis, unspecified: Secondary | ICD-10-CM | POA: Diagnosis present

## 2021-05-06 DIAGNOSIS — N200 Calculus of kidney: Secondary | ICD-10-CM | POA: Diagnosis not present

## 2021-05-06 DIAGNOSIS — I10 Essential (primary) hypertension: Secondary | ICD-10-CM | POA: Diagnosis not present

## 2021-05-06 DIAGNOSIS — R7303 Prediabetes: Secondary | ICD-10-CM | POA: Diagnosis present

## 2021-05-06 DIAGNOSIS — D72829 Elevated white blood cell count, unspecified: Secondary | ICD-10-CM | POA: Diagnosis present

## 2021-05-06 DIAGNOSIS — E860 Dehydration: Secondary | ICD-10-CM | POA: Diagnosis not present

## 2021-05-06 DIAGNOSIS — R262 Difficulty in walking, not elsewhere classified: Secondary | ICD-10-CM | POA: Diagnosis not present

## 2021-05-06 DIAGNOSIS — N3090 Cystitis, unspecified without hematuria: Secondary | ICD-10-CM | POA: Diagnosis present

## 2021-05-06 DIAGNOSIS — R509 Fever, unspecified: Secondary | ICD-10-CM | POA: Diagnosis not present

## 2021-05-06 DIAGNOSIS — A4151 Sepsis due to Escherichia coli [E. coli]: Secondary | ICD-10-CM | POA: Diagnosis present

## 2021-05-06 DIAGNOSIS — R6520 Severe sepsis without septic shock: Secondary | ICD-10-CM | POA: Diagnosis not present

## 2021-05-06 DIAGNOSIS — N390 Urinary tract infection, site not specified: Secondary | ICD-10-CM

## 2021-05-06 DIAGNOSIS — Z7401 Bed confinement status: Secondary | ICD-10-CM

## 2021-05-06 DIAGNOSIS — Z20822 Contact with and (suspected) exposure to covid-19: Secondary | ICD-10-CM | POA: Diagnosis not present

## 2021-05-06 DIAGNOSIS — I6529 Occlusion and stenosis of unspecified carotid artery: Secondary | ICD-10-CM | POA: Diagnosis present

## 2021-05-06 DIAGNOSIS — E782 Mixed hyperlipidemia: Secondary | ICD-10-CM | POA: Diagnosis present

## 2021-05-06 DIAGNOSIS — Z87898 Personal history of other specified conditions: Secondary | ICD-10-CM | POA: Diagnosis present

## 2021-05-06 DIAGNOSIS — R7881 Bacteremia: Secondary | ICD-10-CM | POA: Diagnosis present

## 2021-05-06 DIAGNOSIS — Z825 Family history of asthma and other chronic lower respiratory diseases: Secondary | ICD-10-CM

## 2021-05-06 DIAGNOSIS — R000 Tachycardia, unspecified: Secondary | ICD-10-CM | POA: Diagnosis present

## 2021-05-06 DIAGNOSIS — Z9181 History of falling: Secondary | ICD-10-CM | POA: Diagnosis not present

## 2021-05-06 DIAGNOSIS — Z820 Family history of epilepsy and other diseases of the nervous system: Secondary | ICD-10-CM | POA: Diagnosis not present

## 2021-05-06 DIAGNOSIS — R0689 Other abnormalities of breathing: Secondary | ICD-10-CM | POA: Diagnosis not present

## 2021-05-06 DIAGNOSIS — A419 Sepsis, unspecified organism: Secondary | ICD-10-CM | POA: Diagnosis present

## 2021-05-06 DIAGNOSIS — Z8249 Family history of ischemic heart disease and other diseases of the circulatory system: Secondary | ICD-10-CM

## 2021-05-06 DIAGNOSIS — I499 Cardiac arrhythmia, unspecified: Secondary | ICD-10-CM | POA: Diagnosis not present

## 2021-05-06 DIAGNOSIS — R319 Hematuria, unspecified: Secondary | ICD-10-CM | POA: Diagnosis not present

## 2021-05-06 DIAGNOSIS — N400 Enlarged prostate without lower urinary tract symptoms: Secondary | ICD-10-CM | POA: Diagnosis present

## 2021-05-06 DIAGNOSIS — M1990 Unspecified osteoarthritis, unspecified site: Secondary | ICD-10-CM | POA: Diagnosis not present

## 2021-05-06 DIAGNOSIS — R17 Unspecified jaundice: Secondary | ICD-10-CM | POA: Diagnosis present

## 2021-05-06 DIAGNOSIS — W010XXA Fall on same level from slipping, tripping and stumbling without subsequent striking against object, initial encounter: Secondary | ICD-10-CM | POA: Diagnosis present

## 2021-05-06 DIAGNOSIS — B9620 Unspecified Escherichia coli [E. coli] as the cause of diseases classified elsewhere: Secondary | ICD-10-CM | POA: Diagnosis not present

## 2021-05-06 DIAGNOSIS — Z87442 Personal history of urinary calculi: Secondary | ICD-10-CM | POA: Diagnosis not present

## 2021-05-06 DIAGNOSIS — I700 Atherosclerosis of aorta: Secondary | ICD-10-CM | POA: Diagnosis not present

## 2021-05-06 DIAGNOSIS — I779 Disorder of arteries and arterioles, unspecified: Secondary | ICD-10-CM | POA: Diagnosis present

## 2021-05-06 DIAGNOSIS — R300 Dysuria: Secondary | ICD-10-CM | POA: Diagnosis not present

## 2021-05-06 DIAGNOSIS — Z8673 Personal history of transient ischemic attack (TIA), and cerebral infarction without residual deficits: Secondary | ICD-10-CM

## 2021-05-06 DIAGNOSIS — Z743 Need for continuous supervision: Secondary | ICD-10-CM | POA: Diagnosis not present

## 2021-05-06 LAB — CBC WITH DIFFERENTIAL/PLATELET
Abs Immature Granulocytes: 0.21 10*3/uL — ABNORMAL HIGH (ref 0.00–0.07)
Basophils Absolute: 0 10*3/uL (ref 0.0–0.1)
Basophils Relative: 0 %
Eosinophils Absolute: 0 10*3/uL (ref 0.0–0.5)
Eosinophils Relative: 0 %
HCT: 40.8 % (ref 39.0–52.0)
Hemoglobin: 13.3 g/dL (ref 13.0–17.0)
Immature Granulocytes: 1 %
Lymphocytes Relative: 5 %
Lymphs Abs: 0.9 10*3/uL (ref 0.7–4.0)
MCH: 31.7 pg (ref 26.0–34.0)
MCHC: 32.6 g/dL (ref 30.0–36.0)
MCV: 97.1 fL (ref 80.0–100.0)
Monocytes Absolute: 1.3 10*3/uL — ABNORMAL HIGH (ref 0.1–1.0)
Monocytes Relative: 8 %
Neutro Abs: 14.6 10*3/uL — ABNORMAL HIGH (ref 1.7–7.7)
Neutrophils Relative %: 86 %
Platelets: 179 10*3/uL (ref 150–400)
RBC: 4.2 MIL/uL — ABNORMAL LOW (ref 4.22–5.81)
RDW: 12.5 % (ref 11.5–15.5)
WBC: 17 10*3/uL — ABNORMAL HIGH (ref 4.0–10.5)
nRBC: 0 % (ref 0.0–0.2)

## 2021-05-06 LAB — RESP PANEL BY RT-PCR (FLU A&B, COVID) ARPGX2
Influenza A by PCR: NEGATIVE
Influenza B by PCR: NEGATIVE
SARS Coronavirus 2 by RT PCR: NEGATIVE

## 2021-05-06 LAB — URINALYSIS, ROUTINE W REFLEX MICROSCOPIC
Bilirubin Urine: NEGATIVE
Glucose, UA: NEGATIVE mg/dL
Ketones, ur: 20 mg/dL — AB
Nitrite: NEGATIVE
Protein, ur: 100 mg/dL — AB
Specific Gravity, Urine: 1.02 (ref 1.005–1.030)
pH: 5 (ref 5.0–8.0)

## 2021-05-06 LAB — COMPREHENSIVE METABOLIC PANEL
ALT: 14 U/L (ref 0–44)
AST: 27 U/L (ref 15–41)
Albumin: 4.2 g/dL (ref 3.5–5.0)
Alkaline Phosphatase: 62 U/L (ref 38–126)
Anion gap: 12 (ref 5–15)
BUN: 15 mg/dL (ref 8–23)
CO2: 26 mmol/L (ref 22–32)
Calcium: 9 mg/dL (ref 8.9–10.3)
Chloride: 97 mmol/L — ABNORMAL LOW (ref 98–111)
Creatinine, Ser: 1.06 mg/dL (ref 0.61–1.24)
GFR, Estimated: >60 mL/min (ref >60)
Glucose, Bld: 117 mg/dL — ABNORMAL HIGH (ref 70–99)
Potassium: 3.9 mmol/L (ref 3.5–5.1)
Sodium: 135 mmol/L (ref 135–145)
Total Bilirubin: 2.1 mg/dL — ABNORMAL HIGH (ref 0.3–1.2)
Total Protein: 7.5 g/dL (ref 6.5–8.1)

## 2021-05-06 LAB — LACTIC ACID, PLASMA
Lactic Acid, Venous: 1.9 mmol/L (ref 0.5–1.9)
Lactic Acid, Venous: 2.2 mmol/L (ref 0.5–1.9)
Lactic Acid, Venous: 1.9 mmol/L (ref 0.5–1.9)

## 2021-05-06 LAB — APTT: aPTT: 30 seconds (ref 24–36)

## 2021-05-06 LAB — PROTIME-INR
INR: 1.2 (ref 0.8–1.2)
Prothrombin Time: 15.5 seconds — ABNORMAL HIGH (ref 11.4–15.2)

## 2021-05-06 MED ORDER — LACTATED RINGERS IV SOLN
INTRAVENOUS | Status: DC
  Administered 2021-05-08: 1000 mL via INTRAVENOUS

## 2021-05-06 MED ORDER — ACETAMINOPHEN 325 MG PO TABS
650.0000 mg | ORAL_TABLET | Freq: Four times a day (QID) | ORAL | Status: DC
  Administered 2021-05-06 – 2021-05-09 (×12): 650 mg via ORAL
  Filled 2021-05-06 (×12): qty 2

## 2021-05-06 MED ORDER — TRAZODONE HCL 50 MG PO TABS: 25.0000 mg | ORAL_TABLET | Freq: Every evening | ORAL | Status: DC | PRN

## 2021-05-06 MED ORDER — OXYCODONE HCL 5 MG PO TABS: 2.5000 mg | ORAL_TABLET | Freq: Four times a day (QID) | ORAL | Status: DC | PRN

## 2021-05-06 MED ORDER — ACETAMINOPHEN 650 MG RE SUPP: 650.0000 mg | Freq: Four times a day (QID) | RECTAL | Status: DC

## 2021-05-06 MED ORDER — HEPARIN SODIUM (PORCINE) 5000 UNIT/ML IJ SOLN
5000.0000 [IU] | Freq: Three times a day (TID) | INTRAMUSCULAR | Status: DC
  Administered 2021-05-06 – 2021-05-09 (×8): 5000 [IU] via SUBCUTANEOUS
  Filled 2021-05-06 (×8): qty 1

## 2021-05-06 MED ORDER — SODIUM CHLORIDE 0.9 % IV SOLN
2.0000 g | INTRAVENOUS | Status: DC
  Administered 2021-05-06 – 2021-05-08 (×3): 2 g via INTRAVENOUS
  Filled 2021-05-06 (×3): qty 20

## 2021-05-06 MED ORDER — LACTATED RINGERS IV BOLUS (SEPSIS)
1000.0000 mL | Freq: Once | INTRAVENOUS | Status: AC
  Administered 2021-05-06: 1000 mL via INTRAVENOUS

## 2021-05-06 MED ORDER — ATORVASTATIN CALCIUM 10 MG PO TABS
10.0000 mg | ORAL_TABLET | Freq: Every evening | ORAL | Status: DC
  Administered 2021-05-07 – 2021-05-08 (×2): 10 mg via ORAL
  Filled 2021-05-06 (×2): qty 1

## 2021-05-06 MED ORDER — LORATADINE 10 MG PO TABS
10.0000 mg | ORAL_TABLET | Freq: Every day | ORAL | Status: DC
  Administered 2021-05-07 – 2021-05-09 (×3): 10 mg via ORAL
  Filled 2021-05-06 (×3): qty 1

## 2021-05-06 MED ORDER — BISACODYL 5 MG PO TBEC
5.0000 mg | DELAYED_RELEASE_TABLET | Freq: Every day | ORAL | Status: DC | PRN
  Administered 2021-05-09: 03:00:00 5 mg via ORAL
  Filled 2021-05-06: qty 1

## 2021-05-06 MED ORDER — ONDANSETRON HCL 4 MG PO TABS: 4.0000 mg | ORAL_TABLET | Freq: Four times a day (QID) | ORAL | Status: DC | PRN

## 2021-05-06 MED ORDER — ACETAMINOPHEN 325 MG PO TABS
650.0000 mg | ORAL_TABLET | Freq: Once | ORAL | Status: AC
  Administered 2021-05-06: 650 mg via ORAL
  Filled 2021-05-06: qty 2

## 2021-05-06 MED ORDER — LACTATED RINGERS IV SOLN: INTRAVENOUS | Status: DC

## 2021-05-06 MED ORDER — CLOPIDOGREL BISULFATE 75 MG PO TABS
75.0000 mg | ORAL_TABLET | Freq: Every day | ORAL | Status: DC
  Administered 2021-05-07 – 2021-05-09 (×3): 75 mg via ORAL
  Filled 2021-05-06 (×3): qty 1

## 2021-05-06 MED ORDER — TAMSULOSIN HCL 0.4 MG PO CAPS
0.4000 mg | ORAL_CAPSULE | Freq: Every day | ORAL | Status: DC
  Administered 2021-05-06 – 2021-05-08 (×3): 0.4 mg via ORAL
  Filled 2021-05-06 (×3): qty 1

## 2021-05-06 MED ORDER — LACTATED RINGERS IV BOLUS (SEPSIS)
500.0000 mL | Freq: Once | INTRAVENOUS | Status: AC
  Administered 2021-05-06: 500 mL via INTRAVENOUS

## 2021-05-06 MED ORDER — ONDANSETRON HCL 4 MG/2ML IJ SOLN: 4.0000 mg | Freq: Four times a day (QID) | INTRAMUSCULAR | Status: DC | PRN

## 2021-05-06 MED ORDER — IBUPROFEN 400 MG PO TABS
600.0000 mg | ORAL_TABLET | Freq: Once | ORAL | Status: AC
  Administered 2021-05-06: 600 mg via ORAL
  Filled 2021-05-06: qty 2

## 2021-05-06 NOTE — ED Notes (Signed)
Lactic acid 2.1 EDP aware

## 2021-05-06 NOTE — ED Provider Notes (Signed)
[facility] EMERGENCY DEPARTMENT Provider Note   CSN: [id number] Arrival date & time: 05/06/21  1021     History Chief Complaint  Patient presents with   Hematuria    Paul Bradshaw is a 87 y.o. male presenting for evaluation of hematuria and dysuria.  Patient states for the past few days, he has had hematuria and dysuria.  He states a few days ago he got up in the middle night, was urinating himself, and therefore slipped on his own urine.  He fell onto his buttocks.  He denies hitting his head or loss of consciousness.  The past few days, he has had increased weakness to the point where he is unable to walk as is his baseline.  He denies chest pain, shortness of breath, cough, nausea, vomiting, abnormal bowel movements.  He states he stopped taking his blood thinner, likely his Plavix, and this has decreased the hematuria.  Additional history obtained per chart review.  Patient with a history of aortic regurg, BPH, hypertension, hyperlipidemia, stroke, CAD  HPI     Past Medical History:  Diagnosis Date   Aortic regurgitation    Moderate   Arthritis    BPH (benign prostatic hyperplasia)    Cervical disc disease    Cervical radiculopathy    Hyperlipidemia    Hypertension    Prediabetes 02/16/2021   Staphylococcus aureus bacteremia 08/09/2012   TEE negative for vegetation or thrombus February 2014   Stroke [facility]) 2005 or 2006   3   Symptomatic carotid artery stenosis with infarction [facility]) 2005 or 2006   Status post right carotid endarterectomy    Patient Active Problem List   Diagnosis Date Noted   Cystitis 05/06/2021   Sepsis ([place]) 05/06/2021   Hematuria 05/06/2021   Nephrolithiasis 05/06/2021   Sinus tachycardia 05/06/2021   Lactic acidosis 05/06/2021   Leukocytosis 05/06/2021   Hyperbilirubinemia 05/06/2021   Prediabetes 02/16/2021   White coat syndrome without hypertension    Risk for falls 05/27/2019   History of CVA (cerebrovascular accident) 10/23/2016    HNP (herniated nucleus pulposus), lumbar 01/23/2014   Lumbar stenosis 01/23/2014   BPH (benign prostatic hyperplasia) 10/14/2013   Mixed hyperlipidemia 10/14/2013   Fever 08/08/2012   Carotid artery disease ([place]) 09/22/2011    Past Surgical History:  Procedure Laterality Date   BACK SURGERY     CAROTID ENDARTERECTOMY     CATARACT EXTRACTION W/PHACO Left 02/15/2015   Procedure: CATARACT EXTRACTION PHACO AND INTRAOCULAR LENS PLACEMENT LEFT EYE CDE=30.93;  Surgeon: Tonny Branch, MD;  Location: [facility] ORS;  Service: Ophthalmology;  Laterality: Left;   CHOLECYSTECTOMY     KIDNEY STONE SURGERY     LUMBAR LAMINECTOMY/DECOMPRESSION MICRODISCECTOMY Bilateral 01/23/2014   Procedure: LUMBAR LAMINECTOMY/DECOMPRESSION MICRODISCECTOMY 1 LEVEL L5-S1;  Surgeon: Charlie Pitter, MD;  Location: [place] NEURO ORS;  Service: Neurosurgery;  Laterality: Bilateral;  LUMBAR LAMINECTOMY/DECOMPRESSION MICRODISCECTOMY 1 LEVEL L5-S1   TEE WITHOUT CARDIOVERSION N/A 08/12/2012   Procedure: TRANSESOPHAGEAL ECHOCARDIOGRAM (TEE);  Surgeon: Josue Hector, MD;  Location: [facility] ENDO SUITE;  Service: Cardiovascular;  Laterality: N/A;   TEE WITHOUT CARDIOVERSION N/A 06/24/2013   Procedure: TRANSESOPHAGEAL ECHOCARDIOGRAM (TEE);  Surgeon: Satira Sark, MD;  Location: [facility] ENDO SUITE;  Service: Endoscopy;  Laterality: N/A;       Family History  Problem Relation Age of Onset   Emphysema Father    Alzheimer's disease Mother    Heart disease Brother    Heart attack Brother    COPD Son  Social History   Tobacco Use   Smoking status: Never   Smokeless tobacco: Never  Vaping Use   Vaping Use: Never used  Substance Use Topics   Alcohol use: No   Drug use: No    Home Medications Prior to Admission medications   Medication Sig Start Date End Date Taking? Authorizing Provider  acetaminophen (TYLENOL) 500 MG tablet Take 1,000 mg by mouth every 8 (eight) hours as needed for moderate pain.    [provider]  atorvastatin  (LIPITOR) 10 MG tablet TAKE ONE-HALF TABLET BY  MOUTH DAILY AT 6 PM 03/29/21   Deliah Boston F, FNP  clopidogrel (PLAVIX) 75 MG tablet TAKE 1 TABLET BY MOUTH  DAILY WITH BREAKFAST 03/29/21   Gwenlyn Fudge, FNP  Multiple Vitamins-Minerals (MULTIVITAMIN PO) Take 1 tablet by mouth daily.    [provider]  tamsulosin (FLOMAX) 0.4 MG CAPS capsule TAKE 1 CAPSULE BY MOUTH IN  THE EVENING Patient taking differently: Take 0.4 mg by mouth daily. 03/29/21   Gwenlyn Fudge, FNP  Turmeric 1053 MG TABS Take by mouth.    [provider]    Allergies    Amlodipine and Lisinopril  Review of Systems   Review of Systems  Genitourinary:  Positive for dysuria, frequency, hematuria and urgency.  Neurological:  Positive for weakness.  Hematological:  Bruises/bleeds easily.  All other systems reviewed and are negative.  Physical Exam Updated Vital Signs BP 118/74   Pulse (!) 116   Temp 99.5 F (37.5 C) (Oral)   Resp (!) 25   Ht 5\' 5"  (1.651 m)   Wt 77.1 kg   SpO2 95%   BMI 28.29 kg/m   Physical Exam Vitals and nursing note reviewed.  Constitutional:      General: He is not in acute distress.    Appearance: He is ill-appearing.     Comments: Appears frail, weak to the point he is unable to sit up in bed.  HENT:     Head: Normocephalic and atraumatic.     Mouth/Throat:     Mouth: Mucous membranes are dry.  Eyes:     Conjunctiva/sclera: Conjunctivae normal.     Pupils: Pupils are equal, round, and reactive to light.  Cardiovascular:     Rate and Rhythm: Regular rhythm. Tachycardia present.     Pulses: Normal pulses.  Pulmonary:     Effort: Pulmonary effort is normal. No respiratory distress.     Breath sounds: Normal breath sounds. No wheezing.     Comments: Speaking in full sentences.  Clear lung sounds in all fields. Abdominal:     General: There is no distension.     Palpations: Abdomen is soft. There is no mass.     Tenderness: There is no abdominal tenderness.  There is no guarding or rebound.  Musculoskeletal:        General: No tenderness. Normal range of motion.     Cervical back: Normal range of motion and neck supple.     Comments: No tenderness palpation of the back.  No signs of injury or bruising.  Skin:    General: Skin is warm and dry.     Capillary Refill: Capillary refill takes less than 2 seconds.  Neurological:     Mental Status: He is alert and oriented to person, place, and time.  Psychiatric:        Mood and Affect: Mood and affect normal.        Speech: Speech normal.  Behavior: Behavior normal.    ED Results / Procedures / Treatments   Labs (all labs ordered are listed, but only abnormal results are displayed) Labs Reviewed  LACTIC ACID, PLASMA - Abnormal; Notable for the following components:      Result Value   Lactic Acid, Venous 2.2 (*)    All other components within normal limits  COMPREHENSIVE METABOLIC PANEL - Abnormal; Notable for the following components:   Chloride 97 (*)    Glucose, Bld 117 (*)    Total Bilirubin 2.1 (*)    All other components within normal limits  CBC WITH DIFFERENTIAL/PLATELET - Abnormal; Notable for the following components:   WBC 17.0 (*)    RBC 4.20 (*)    Neutro Abs 14.6 (*)    Monocytes Absolute 1.3 (*)    Abs Immature Granulocytes 0.21 (*)    All other components within normal limits  PROTIME-INR - Abnormal; Notable for the following components:   Prothrombin Time 15.5 (*)    All other components within normal limits  URINALYSIS, ROUTINE W REFLEX MICROSCOPIC - Abnormal; Notable for the following components:   Color, Urine AMBER (*)    APPearance HAZY (*)    Hgb urine dipstick LARGE (*)    Ketones, ur 20 (*)    Protein, ur 100 (*)    Leukocytes,Ua LARGE (*)    Bacteria, UA RARE (*)    All other components within normal limits  CULTURE, BLOOD (ROUTINE X 2)  CULTURE, BLOOD (ROUTINE X 2)  RESP PANEL BY RT-PCR (FLU A&B, COVID) ARPGX2  URINE CULTURE  LACTIC ACID,  PLASMA  APTT  LACTIC ACID, PLASMA  LACTIC ACID, PLASMA    EKG None  Radiology DG Chest Port 1 View  Result Date: 05/06/2021 CLINICAL DATA:  Questionable sepsis. EXAM: PORTABLE CHEST 1 VIEW COMPARISON:  11/26/2017 FINDINGS: The cardiomediastinal silhouette is unchanged with normal heart size. Aortic atherosclerosis is noted. The lungs are well inflated with mild chronic interstitial coarsening. No confluent airspace opacity, edema, sizable pleural effusion, or pneumothorax is identified. No acute osseous abnormality is seen. IMPRESSION: No active disease. Electronically Signed   By: Logan Bores M.D.   On: 05/06/2021 11:22   CT Renal Stone Study  Result Date: 05/06/2021 CLINICAL DATA:  Hematuria of unknown cause. Dysuria and incontinence. Patient fell 3-4 days ago. EXAM: CT ABDOMEN AND PELVIS WITHOUT CONTRAST TECHNIQUE: Multidetector CT imaging of the abdomen and pelvis was performed following the standard protocol without IV contrast. COMPARISON:  None. FINDINGS: Lower chest: Significant coronary artery calcifications. Heart size is normal. No pericardial effusion. Hepatobiliary: Calcified granulomata within the liver. Status post cholecystectomy. Pancreas: Unremarkable. No pancreatic ductal dilatation or surrounding inflammatory changes. Spleen: Normal in size without focal abnormality. Adrenals/Urinary Tract: Adrenal glands are normal. RIGHT kidney: LOWER pole cyst is 3.0 centimeters. There is no hydronephrosis. RIGHT ureter is unremarkable. LEFT kidney: 3 millimeter calculus identified in the midpole region. No hydronephrosis. No renal mass. LEFT ureter is unremarkable. Bladder is decompressed with thickened wall. There is stranding surrounding the urinary bladder, consistent with cystitis or bladder outlet obstruction. Stomach/Bowel: Large hiatal hernia. Stomach and small bowel loops are otherwise normal in appearance. Numerous colonic diverticula without evidence for acute diverticulitis. The  appendix is well seen and has a normal appearance. Vascular/Lymphatic: There is moderate atherosclerosis of the abdominal aorta and its branches. No adenopathy. Reproductive: Prostate is enlarged and partially calcified. Other: No ascites.  Abdominal wall is unremarkable. Musculoskeletal: Previous lumbar laminectomy. There has been fusion of  L2-3. There is posterior listhesis of L3 on L4 by 9 millimeters. Marked LOWER lumbar degenerative changes. No lytic or blastic lesions. IMPRESSION: 1. Thickened urinary bladder wall and stranding surrounding the bladder, favoring cystitis. Similar appearance can be seen with bladder outlet obstruction. 2. Prostatic enlargement. 3. Significant coronary artery disease. 4. Cholecystectomy. 5. Nephrolithiasis. 6. Large hiatal hernia. 7.  Aortic atherosclerosis.  (ICD10-I70.0) 8. Prior lumbar fusion and laminectomy. Degenerative changes in the LOWER lumbar spine. Electronically Signed   By: Nolon Nations M.D.   On: 05/06/2021 13:04    Procedures .Critical Care Performed by: Franchot Heidelberg, PA-C Authorized by: Franchot Heidelberg, PA-C   Critical care provider statement:    Critical care time (minutes):  40   Critical care time was exclusive of:  Separately billable procedures and treating other patients and teaching time   Critical care was necessary to treat or prevent imminent or life-threatening deterioration of the following conditions:  Sepsis   Critical care was time spent personally by me on the following activities:  Blood draw for specimens, development of treatment plan with patient or surrogate, evaluation of patient's response to treatment, examination of patient, obtaining history from patient or surrogate, ordering and performing treatments and interventions, ordering and review of laboratory studies, ordering and review of radiographic studies, pulse oximetry, re-evaluation of patient's condition and review of old charts   I assumed direction of  critical care for this patient from another provider in my specialty: no     Care discussed with: admitting provider     Medications Ordered in ED Medications  lactated ringers infusion ( Intravenous New Bag/Given 05/06/21 1526)  cefTRIAXone (ROCEPHIN) 2 g in sodium chloride 0.9 % 100 mL IVPB (0 g Intravenous Stopped 05/06/21 1421)  heparin injection 5,000 Units (has no administration in time range)  acetaminophen (TYLENOL) tablet 650 mg (650 mg Oral Given 05/06/21 1527)    Or  acetaminophen (TYLENOL) suppository 650 mg ( Rectal See Alternative 05/06/21 1527)  oxyCODONE (Oxy IR/ROXICODONE) immediate release tablet 2.5 mg (has no administration in time range)  traZODone (DESYREL) tablet 25 mg (has no administration in time range)  bisacodyl (DULCOLAX) EC tablet 5 mg (has no administration in time range)  ondansetron (ZOFRAN) tablet 4 mg (has no administration in time range)    Or  ondansetron (ZOFRAN) injection 4 mg (has no administration in time range)  acetaminophen (TYLENOL) tablet 650 mg (650 mg Oral Given 05/06/21 1121)  lactated ringers bolus 1,000 mL (0 mLs Intravenous Stopped 05/06/21 1529)    And  lactated ringers bolus 1,000 mL (0 mLs Intravenous Stopped 05/06/21 1529)    And  lactated ringers bolus 500 mL (500 mLs Intravenous New Bag/Given 05/06/21 1526)  ibuprofen (ADVIL) tablet 600 mg (600 mg Oral Given 05/06/21 1456)    ED Course  I have reviewed the triage vital signs and the nursing notes.  Pertinent labs & imaging results that were available during my care of the patient were reviewed by me and considered in my medical decision making (see chart for details).    MDM Rules/Calculators/A&P                           Patient presented for evaluation of weakness and urinary symptoms.  On exam, patient appears frail and weak.  He is tachycardic with a oral temperature of 100.3, concern for infection.  Blood pressure stable.  Will obtain labs.  Due to his hematuria and  weakness,  will also obtain CT renal to ensure no associated kidney stone or Pyelo.   Labs interpreted by me, shows leukocytosis of 17.  In the setting of fever, tachycardia, leukocytosis, code sepsis called.  Although lactic is normal.  Electrolytes otherwise reassuring.  COVID and flu negative.  Chest x-ray viewed and independently turbid by me, no pneumonia pneumothorax or effusion.  CT renal shows thickened bladder wall, otherwise no acute findings.  Antibiotic started for UTI.  Due to patient's weakness, I do not feel he is safe to go home, he will need to be admitted for UTI treatment.  Sepsis reassessment performed, patient's blood pressure improved. However repeat lactic elevated slightly. Will order a 3rd lactic.   Discussed with Dr. Wynetta Emery from [facility] hospitalist service, patient to be admitted.  Final Clinical Impression(s) / ED Diagnoses Final diagnoses:  Urinary tract infection with hematuria, site unspecified  Sepsis, due to unspecified organism, unspecified whether acute organ dysfunction present [facility])    Rx / DC Orders ED Discharge Orders     None        Franchot Heidelberg, PA-C 05/06/21 1640    Varney Biles, MD 05/08/21 1245

## 2021-05-06 NOTE — ED Notes (Signed)
Paul Bradshaw   Daughter  [phone]

## 2021-05-06 NOTE — ED Notes (Signed)
Patient transported to CT 

## 2021-05-06 NOTE — Sepsis Progress Note (Signed)
Notified provider and bedside nurse of need to order repeat lactic acid (3rd).  

## 2021-05-06 NOTE — ED Triage Notes (Signed)
Pt brought in by [facility] from home with c/o hematuria, dysuria, incontinence x 3-4 days. Hematuria started when he fell 3-4 days ago. Pt is unsure if he is having blood in his stool as well. Pt has become bed bound since the hematuria started. EMS reports he usually can get around the house pretty well independently. Hx of severe kidney infections per EMS.

## 2021-05-06 NOTE — Sepsis Progress Note (Signed)
eLink monitoring code sepsis.  

## 2021-05-06 NOTE — H&P (Addendum)
History and Physical  [facility]  LEMAN MARTINEK WSF:[id number] DOB: 1926-04-30 DOA: 05/06/2021  PCP: Gwenlyn Fudge, FNP  Patient coming from: Home by [facility]  Level of care: Med-Surg  I have personally briefly reviewed patient's old medical records in [facility]  Chief Complaint: blood in urine   HPI: Paul Bradshaw is a 87 y.o. male with medical history significant for prediabetes mellitus, carotid artery stenosis, hyperlipidemia, hypertension, cervical radiculopathy, BPH, nephrolithiasis who has a history of staph aureus bacteremia in 2014.  He presented to the emergency department by EMS when family noted that he has been unable to get out of the bed for the past 3 to 4 days.  He started having incontinence dysuria hematuria 4 days ago.  He has had fever and chills.  He has had no abdominal pain.  He denies flank pain.  He has become progressively weaker over the past several days and formally had been independent but now has become bedbound.  He reports that he is afraid that he is having a recurrent bacteremia like he had in 2014.  He says the symptoms feel similar.  He reports cold shaking chills that have been intermittently bothering him for the past several days.  ED Course: He arrived tachycardic and febrile with a temperature 100.3, pulse 122, systolic BP in the 90s.  He was noted to have an elevated lactic acid of 2.2.  He was started on sepsis protocol and received 30 cc/kg IV fluids.  Blood cultures x2 taken.  He was sent for CT renal study given history of nephrolithiasis.  He does have some evidence of kidney stones however the CT was positive for the appearance of cystitis.  His urinalysis was abnormal with a WBC 11-20, large leukocytes, large hemoglobin.  His WBC was elevated at 17.  His SARS 2 coronavirus test was negative.  His influenza a and influenza B test was negative.  He was started on IV antibiotics and admission was requested for further management.  Review  of Systems: Review of Systems  Constitutional:  Positive for chills, fever and malaise/fatigue. Negative for diaphoresis.  HENT:  Positive for congestion, hearing loss and sore throat. Negative for ear discharge, ear pain, nosebleeds and sinus pain.   Eyes: Negative.   Respiratory:  Positive for cough. Negative for hemoptysis, sputum production, shortness of breath, wheezing and stridor.   Cardiovascular: Negative.   Gastrointestinal:  Negative for abdominal pain, diarrhea, heartburn and vomiting.  Genitourinary:  Positive for dysuria, frequency, hematuria and urgency. Negative for flank pain.  Musculoskeletal: Negative.   Skin: Negative.   Neurological:  Positive for dizziness and weakness. Negative for sensory change, speech change, focal weakness, seizures and loss of consciousness.  Endo/Heme/Allergies: Negative.   Psychiatric/Behavioral: Negative.    All other systems reviewed and are negative.   Past Medical History:  Diagnosis Date   Aortic regurgitation    Moderate   Arthritis    BPH (benign prostatic hyperplasia)    Cervical disc disease    Cervical radiculopathy    Hyperlipidemia    Hypertension    Prediabetes 02/16/2021   Staphylococcus aureus bacteremia 08/09/2012   TEE negative for vegetation or thrombus February 2014   Stroke [facility]) 2005 or 2006   3   Symptomatic carotid artery stenosis with infarction [facility]) 2005 or 2006   Status post right carotid endarterectomy    Past Surgical History:  Procedure Laterality Date   BACK SURGERY     CAROTID ENDARTERECTOMY  CATARACT EXTRACTION W/PHACO Left 02/15/2015   Procedure: CATARACT EXTRACTION PHACO AND INTRAOCULAR LENS PLACEMENT LEFT EYE CDE=30.93;  Surgeon: Tonny Branch, MD;  Location: [facility];  Service: Ophthalmology;  Laterality: Left;   CHOLECYSTECTOMY     KIDNEY STONE SURGERY     LUMBAR LAMINECTOMY/DECOMPRESSION MICRODISCECTOMY Bilateral 01/23/2014   Procedure: LUMBAR LAMINECTOMY/DECOMPRESSION MICRODISCECTOMY 1 LEVEL  L5-S1;  Surgeon: Charlie Pitter, MD;  Location: [facility];  Service: Neurosurgery;  Laterality: Bilateral;  LUMBAR LAMINECTOMY/DECOMPRESSION MICRODISCECTOMY 1 LEVEL L5-S1   TEE WITHOUT CARDIOVERSION N/A 08/12/2012   Procedure: TRANSESOPHAGEAL ECHOCARDIOGRAM (TEE);  Surgeon: Josue Hector, MD;  Location: [facility];  Service: Cardiovascular;  Laterality: N/A;   TEE WITHOUT CARDIOVERSION N/A 06/24/2013   Procedure: TRANSESOPHAGEAL ECHOCARDIOGRAM (TEE);  Surgeon: Satira Sark, MD;  Location: [facility];  Service: Endoscopy;  Laterality: N/A;     reports that he has never smoked. He has never used smokeless tobacco. He reports that he does not drink alcohol and does not use drugs.  Allergies  Allergen Reactions   Amlodipine Swelling    Swelling of lips.   Lisinopril Other (See Comments)    Elevated BP higher & causes a burning feeling on the inside.     Family History  Problem Relation Age of Onset   Emphysema Father    Alzheimer's disease Mother    Heart disease Brother    Heart attack Brother    COPD Son     Prior to Admission medications   Medication Sig Start Date End Date Taking? Authorizing Provider  acetaminophen (TYLENOL) 500 MG tablet Take 1,000 mg by mouth every 8 (eight) hours as needed for moderate pain.    [provider]  atorvastatin (LIPITOR) 10 MG tablet TAKE ONE-HALF TABLET BY  MOUTH DAILY AT 6 PM 03/29/21   Hendricks Limes F, FNP  clopidogrel (PLAVIX) 75 MG tablet TAKE 1 TABLET BY MOUTH  DAILY WITH BREAKFAST 03/29/21   Loman Brooklyn, FNP  Multiple Vitamins-Minerals (MULTIVITAMIN PO) Take 1 tablet by mouth daily.    [provider]  tamsulosin (FLOMAX) 0.4 MG CAPS capsule TAKE 1 CAPSULE BY MOUTH IN  THE EVENING Patient taking differently: Take 0.4 mg by mouth daily. 03/29/21   Loman Brooklyn, FNP  Turmeric 1053 MG TABS Take by mouth.    [provider]    Physical Exam: Vitals:   05/06/21 1300 05/06/21 1330 05/06/21 1355  05/06/21 1430  BP: (!) 108/56 (!) 120/51  107/76  Pulse: (!) 101 97  (!) 108  Resp: (!) 21 16  (!) 26  Temp:   98.8 F (37.1 C) 99.5 F (37.5 C)  TempSrc:   Oral Oral  SpO2: 96% 98%  93%  Weight:      Height:       Constitutional: Elderly male, awake, alert, hard of hearing, NAD, calm, comfortable Eyes: PERRL, lids and conjunctivae normal ENMT: Mucous membranes are dry. Posterior pharynx clear of any exudate or lesions. Neck: normal, supple, no masses, no thyromegaly Respiratory: clear to auscultation bilaterally, no wheezing, no crackles. Normal respiratory effort. No accessory muscle use.  Cardiovascular: normal s1, s2 sounds, tachycardic rate, no murmurs / rubs / gallops. No extremity edema. 2+ pedal pulses. No carotid bruits.  Abdomen: Suprapubic tenderness, no masses palpated. No hepatosplenomegaly. Bowel sounds positive.  Musculoskeletal: no clubbing / cyanosis. No joint deformity upper and lower extremities. Good ROM, no contractures. Normal muscle tone.  Skin: no rashes, lesions, ulcers. No induration Neurologic: CN 2-12  grossly intact. Sensation intact, DTR normal. Strength 5/5 in all 4.  Psychiatric: Normal judgment and insight. Alert and oriented x 3. Normal mood.   Labs on Admission: I have personally reviewed following labs and imaging studies  CBC: Recent Labs  Lab 05/06/21 1132  WBC 17.0*  NEUTROABS 14.6*  HGB 13.3  HCT 40.8  MCV 97.1  PLT 0000000   Basic Metabolic Panel: Recent Labs  Lab 05/06/21 1132  NA 135  K 3.9  CL 97*  CO2 26  GLUCOSE 117*  BUN 15  CREATININE 1.06  CALCIUM 9.0   GFR: Estimated Creatinine Clearance: 41.7 mL/min (by C-G formula based on SCr of 1.06 mg/dL). Liver Function Tests: Recent Labs  Lab 05/06/21 1132  AST 27  ALT 14  ALKPHOS 62  BILITOT 2.1*  PROT 7.5  ALBUMIN 4.2   No results for input(s): LIPASE, AMYLASE in the last 168 hours. No results for input(s): AMMONIA in the last 168 hours. Coagulation Profile: Recent  Labs  Lab 05/06/21 1220  INR 1.2   Cardiac Enzymes: No results for input(s): CKTOTAL, CKMB, CKMBINDEX, TROPONINI in the last 168 hours. BNP (last 3 results) No results for input(s): PROBNP in the last 8760 hours. HbA1C: No results for input(s): HGBA1C in the last 72 hours. CBG: No results for input(s): GLUCAP in the last 168 hours. Lipid Profile: No results for input(s): CHOL, HDL, LDLCALC, TRIG, CHOLHDL, LDLDIRECT in the last 72 hours. Thyroid Function Tests: No results for input(s): TSH, T4TOTAL, FREET4, T3FREE, THYROIDAB in the last 72 hours. Anemia Panel: No results for input(s): VITAMINB12, FOLATE, FERRITIN, TIBC, IRON, RETICCTPCT in the last 72 hours. Urine analysis:    Component Value Date/Time   COLORURINE AMBER (A) 05/06/2021 1312   APPEARANCEUR HAZY (A) 05/06/2021 1312   LABSPEC 1.020 05/06/2021 1312   PHURINE 5.0 05/06/2021 1312   GLUCOSEU NEGATIVE 05/06/2021 1312   HGBUR LARGE (A) 05/06/2021 1312   BILIRUBINUR NEGATIVE 05/06/2021 1312   KETONESUR 20 (A) 05/06/2021 1312   PROTEINUR 100 (A) 05/06/2021 1312   UROBILINOGEN 2.0 (H) 08/08/2012 1104   NITRITE NEGATIVE 05/06/2021 1312   LEUKOCYTESUR LARGE (A) 05/06/2021 1312    Radiological Exams on Admission: DG Chest Port 1 View  Result Date: 05/06/2021 CLINICAL DATA:  Questionable sepsis. EXAM: PORTABLE CHEST 1 VIEW COMPARISON:  11/26/2017 FINDINGS: The cardiomediastinal silhouette is unchanged with normal heart size. Aortic atherosclerosis is noted. The lungs are well inflated with mild chronic interstitial coarsening. No confluent airspace opacity, edema, sizable pleural effusion, or pneumothorax is identified. No acute osseous abnormality is seen. IMPRESSION: No active disease. Electronically Signed   By: Logan Bores M.D.   On: 05/06/2021 11:22   CT Renal Stone Study  Result Date: 05/06/2021 CLINICAL DATA:  Hematuria of unknown cause. Dysuria and incontinence. Patient fell 3-4 days ago. EXAM: CT ABDOMEN AND  PELVIS WITHOUT CONTRAST TECHNIQUE: Multidetector CT imaging of the abdomen and pelvis was performed following the standard protocol without IV contrast. COMPARISON:  None. FINDINGS: Lower chest: Significant coronary artery calcifications. Heart size is normal. No pericardial effusion. Hepatobiliary: Calcified granulomata within the liver. Status post cholecystectomy. Pancreas: Unremarkable. No pancreatic ductal dilatation or surrounding inflammatory changes. Spleen: Normal in size without focal abnormality. Adrenals/Urinary Tract: Adrenal glands are normal. RIGHT kidney: LOWER pole cyst is 3.0 centimeters. There is no hydronephrosis. RIGHT ureter is unremarkable. LEFT kidney: 3 millimeter calculus identified in the midpole region. No hydronephrosis. No renal mass. LEFT ureter is unremarkable. Bladder is decompressed with thickened wall. There  is stranding surrounding the urinary bladder, consistent with cystitis or bladder outlet obstruction. Stomach/Bowel: Large hiatal hernia. Stomach and small bowel loops are otherwise normal in appearance. Numerous colonic diverticula without evidence for acute diverticulitis. The appendix is well seen and has a normal appearance. Vascular/Lymphatic: There is moderate atherosclerosis of the abdominal aorta and its branches. No adenopathy. Reproductive: Prostate is enlarged and partially calcified. Other: No ascites.  Abdominal wall is unremarkable. Musculoskeletal: Previous lumbar laminectomy. There has been fusion of L2-3. There is posterior listhesis of L3 on L4 by 9 millimeters. Marked LOWER lumbar degenerative changes. No lytic or blastic lesions. IMPRESSION: 1. Thickened urinary bladder wall and stranding surrounding the bladder, favoring cystitis. Similar appearance can be seen with bladder outlet obstruction. 2. Prostatic enlargement. 3. Significant coronary artery disease. 4. Cholecystectomy. 5. Nephrolithiasis. 6. Large hiatal hernia. 7.  Aortic atherosclerosis.   (ICD10-I70.0) 8. Prior lumbar fusion and laminectomy. Degenerative changes in the LOWER lumbar spine. Electronically Signed   By: Nolon Nations M.D.   On: 05/06/2021 13:04    EKG: Independently reviewed.  Sinus tachycardia  Assessment/Plan Principal Problem:   Cystitis Active Problems:   Sepsis (HCC)   Hematuria   Fever   History of CVA (cerebrovascular accident)   Mixed hyperlipidemia   Risk for falls   Carotid artery disease (HCC)   Prediabetes   Nephrolithiasis   Sinus tachycardia   Lactic acidosis   Leukocytosis   Hyperbilirubinemia   Sepsis from UTI/Cystitis  -Patient has been volume resuscitated with 30 cc/kg IV fluids -Follow lactic acid to resolution -Broad-spectrum IV antibiotics ordered -Follow-up blood cultures and urine cultures  Fever and chills  - acetaminophen ordered - follow blood cultures   Cystitis -Follow urine culture -Continue ceftriaxone 2 g IV every 24 hours -IV fluid hydration  Sinus tachycardia -Secondary to dehydration and sepsis -Treat as above  Leukocytosis -Secondary to cystitis -CBC with differential  Prediabetes -Check hemoglobin A1c -Carbohydrate modified diet  Nephrolithiasis -CT renal study completed with no findings of obstructive disease -No hydronephrosis seen on CT scan  Generalized weakness and debility -Request PT evaluation in a.m. -Consult TOC for SNF consideration  Hyperbilirubinemia -Secondary to sepsis -Fractionated bilirubin testing in a.m. -Continue resuscitation and sepsis management as above  Lactic acidosis -IV fluid hydration as ordered -Follow lactic acid to resolution  DVT prophylaxis: Subcu heparin Code Status: Full Family Communication: None present, discussed plan of care with patient at bedside who verbalized understanding Disposition Plan: To be determined Consults called: PT Admission status: INP Level of care: Med-Surg Irwin Brakeman MD [facility] How to contact the [facility]  Attending or Consulting provider [place] or covering provider during after hours [place], for this patient?  Check the care team in [facility] and look for a) attending/consulting [facility] provider listed and b) the [facility] team listed Log into [email or web address] and use [place]'s universal password to access. If you do not have the password, please contact the hospital operator. Locate the [facility] provider you are looking for under [facility] and page to a number that you can be directly reached. If you still have difficulty reaching the provider, please page the [facility] (Director on Call) for the Hospitalists listed on amion for assistance.   If 7PM-7AM, please contact night-coverage [email or web address] Password [email or web address]  05/06/2021, 3:09 PM

## 2021-05-07 DIAGNOSIS — B9620 Unspecified Escherichia coli [E. coli] as the cause of diseases classified elsewhere: Secondary | ICD-10-CM | POA: Diagnosis present

## 2021-05-07 DIAGNOSIS — R7881 Bacteremia: Secondary | ICD-10-CM

## 2021-05-07 DIAGNOSIS — N200 Calculus of kidney: Secondary | ICD-10-CM

## 2021-05-07 DIAGNOSIS — Z8673 Personal history of transient ischemic attack (TIA), and cerebral infarction without residual deficits: Secondary | ICD-10-CM

## 2021-05-07 DIAGNOSIS — R000 Tachycardia, unspecified: Secondary | ICD-10-CM

## 2021-05-07 LAB — BLOOD CULTURE ID PANEL (REFLEXED) - BCID2

## 2021-05-07 LAB — CBC WITH DIFFERENTIAL/PLATELET
Abs Immature Granulocytes: 0.09 10*3/uL — ABNORMAL HIGH (ref 0.00–0.07)
Basophils Absolute: 0 10*3/uL (ref 0.0–0.1)
Basophils Relative: 0 %
Eosinophils Absolute: 0.1 10*3/uL (ref 0.0–0.5)
Eosinophils Relative: 1 %
HCT: 32.5 % — ABNORMAL LOW (ref 39.0–52.0)
Hemoglobin: 10.3 g/dL — ABNORMAL LOW (ref 13.0–17.0)
Immature Granulocytes: 1 %
Lymphocytes Relative: 8 %
Lymphs Abs: 1.1 10*3/uL (ref 0.7–4.0)
MCH: 31.4 pg (ref 26.0–34.0)
MCHC: 31.7 g/dL (ref 30.0–36.0)
MCV: 99.1 fL (ref 80.0–100.0)
Monocytes Absolute: 1 10*3/uL (ref 0.1–1.0)
Monocytes Relative: 7 %
Neutro Abs: 11.9 10*3/uL — ABNORMAL HIGH (ref 1.7–7.7)
Neutrophils Relative %: 83 %
Platelets: 118 10*3/uL — ABNORMAL LOW (ref 150–400)
RBC: 3.28 MIL/uL — ABNORMAL LOW (ref 4.22–5.81)
RDW: 12.7 % (ref 11.5–15.5)
WBC: 14.2 10*3/uL — ABNORMAL HIGH (ref 4.0–10.5)
nRBC: 0 % (ref 0.0–0.2)

## 2021-05-07 LAB — BASIC METABOLIC PANEL
Anion gap: 7 (ref 5–15)
BUN: 21 mg/dL (ref 8–23)
CO2: 26 mmol/L (ref 22–32)
Calcium: 8.1 mg/dL — ABNORMAL LOW (ref 8.9–10.3)
Chloride: 101 mmol/L (ref 98–111)
Creatinine, Ser: 1.11 mg/dL (ref 0.61–1.24)
GFR, Estimated: >60 mL/min (ref >60)
Glucose, Bld: 91 mg/dL (ref 70–99)
Potassium: 3.7 mmol/L (ref 3.5–5.1)
Sodium: 134 mmol/L — ABNORMAL LOW (ref 135–145)

## 2021-05-07 LAB — BILIRUBIN, FRACTIONATED(TOT/DIR/INDIR)
Bilirubin, Direct: 0.3 mg/dL — ABNORMAL HIGH (ref 0.0–0.2)
Indirect Bilirubin: 1 mg/dL — ABNORMAL HIGH (ref 0.3–0.9)
Total Bilirubin: 1.3 mg/dL — ABNORMAL HIGH (ref 0.3–1.2)

## 2021-05-07 LAB — MAGNESIUM: Magnesium: 1.6 mg/dL — ABNORMAL LOW (ref 1.7–2.4)

## 2021-05-07 MED ORDER — MAGNESIUM SULFATE 4 GM/100ML IV SOLN
4.0000 g | Freq: Once | INTRAVENOUS | Status: AC
  Administered 2021-05-07: 4 g via INTRAVENOUS
  Filled 2021-05-07: qty 100

## 2021-05-07 NOTE — Progress Notes (Signed)
Calvin with [facility] [address] accepts referral for [facility].    Jadia Capers, Juleen China, LCSW

## 2021-05-07 NOTE — Plan of Care (Signed)
  Problem: Acute Rehab PT Goals(only PT should resolve) Goal: Pt Will Go Supine/Side To Sit Outcome: Progressing Flowsheets (Taken 05/07/2021 1238) Pt will go Supine/Side to Sit: with modified independence Goal: Patient Will Transfer Sit To/From Stand Outcome: Progressing Flowsheets (Taken 05/07/2021 1238) Patient will transfer sit to/from stand: with supervision Goal: Pt Will Ambulate Outcome: Progressing Flowsheets (Taken 05/07/2021 1238) Pt will Ambulate:  > 125 feet  with supervision  with least restrictive assistive device Goal: Pt/caregiver will Perform Home Exercise Program Outcome: Progressing Flowsheets (Taken 05/07/2021 1238) Pt/caregiver will Perform Home Exercise Program:  For increased ROM  For increased strengthening  For improved balance  Independently   Tori Sabrina Keough PT, DPT 05/07/21, 12:39 PM

## 2021-05-07 NOTE — Progress Notes (Signed)
PROGRESS NOTE   Paul Bradshaw  [id number] DOB: 22-Aug-1925 DOA: 05/06/2021 PCP: Loman Brooklyn, FNP   Chief Complaint  Patient presents with   Hematuria   Level of care: Med-Surg  Brief Admission History:  87 y.o. male with medical history significant for prediabetes mellitus, carotid artery stenosis, hyperlipidemia, hypertension, cervical radiculopathy, BPH, nephrolithiasis who has a history of staph aureus bacteremia in 2014.  He presented to the emergency department by EMS when family noted that he has been unable to get out of the bed for the past 3 to 4 days.  He started having incontinence dysuria hematuria 4 days ago.  He has had fever and chills.  He has had no abdominal pain.  He denies flank pain.  He has become progressively weaker over the past several days and formally had been independent but now has become bedbound.  He reports that he is afraid that he is having a recurrent bacteremia like he had in 2014.  He says the symptoms feel similar.  He reports cold shaking chills that have been intermittently bothering him for the past several days.  He was admitted with sepsis, UTI/ cystitis and dehydration.  Assessment & Plan:   Principal Problem:   Cystitis Active Problems:   Sepsis ([place])   Hematuria   E coli bacteremia   Fever   History of CVA (cerebrovascular accident)   Mixed hyperlipidemia   Risk for falls   Carotid artery disease (HCC)   Prediabetes   Nephrolithiasis   Sinus tachycardia   Lactic acidosis   Leukocytosis   Hyperbilirubinemia   Sepsis from UTI/Cystitis  -Patient has been volume resuscitated with 30 cc/kg IV fluids -lactic acidosis resolved  -Continue IV ceftriaxone 2 g every 24 hours -Blood culture growing E. Coli  E. coli bacteremia -Pansensitive -Continue ceftriaxone 2 g IV every 24   Fever and chills  - acetaminophen ordered - continue supportive measures   Sinus tachycardia -Secondary to dehydration and sepsis -Treat as  above   Leukocytosis -Secondary to cystitis and sepsis -Follow CBC with differential -WBC trending down with treatments    Prediabetes -hemoglobin A1c 5.8%  -Carbohydrate modified diet   Nephrolithiasis -CT renal study completed with no findings of obstructive disease -No hydronephrosis seen on CT scan   Generalized weakness and debility -Requested PT evaluation -Consult TOC for disposition needs   Hyperbilirubinemia -Secondary to sepsis -trending down with treatment -Fractionated bilirubin suggests unconjugated hyperbilirubinemia -Continue resuscitation and sepsis management as above -CMP in AM   Lactic acidosis - RESOLVED  -IV fluid hydration as ordered -Follow lactic acid to resolution    DVT prophylaxis: SQ heparin  Code Status: Full  Family Communication: updated patient with plan of care at bedside, he verbalized understanding Disposition: TBD  Status is: Inpatient  Remains inpatient appropriate because: IV antibiotics, IV fluids    Consultants:  TOC PT   Procedures:    Antimicrobials:  Ceftriaxone 11/14>>   Subjective: Pt reports that he is slowly starting to feel better.  No chills today.  No nausea or vomiting.   Objective: Vitals:   05/06/21 1730 05/06/21 2124 05/07/21 0138 05/07/21 0515  BP: 133/63 (!) 99/48 107/60 (!) 103/45  Pulse: (!) 101 76 68 77  Resp: 18 18 18 18   Temp: 98 F (36.7 C) (!) 97 F (36.1 C) 98.4 F (36.9 C) 99 F (37.2 C)  TempSrc: Oral     SpO2: 97% 99% 100% 97%  Weight:      Height:  Intake/Output Summary (Last 24 hours) at 05/07/2021 1034 Last data filed at 05/07/2021 0900 Gross per 24 hour  Intake 3101.61 ml  Output --  Net 3101.61 ml   Filed Weights   05/06/21 1032  Weight: 77.1 kg    Examination:  General exam: Appears calm and comfortable  Respiratory system: Clear to auscultation. Respiratory effort normal. Cardiovascular system: normal S1 & S2 heard. No JVD, murmurs, rubs, gallops or  clicks. No pedal edema. Gastrointestinal system: Abdomen is nondistended, soft and nontender. No organomegaly or masses felt. Normal bowel sounds heard. Central nervous system: Alert and oriented. No focal neurological deficits. Extremities: Symmetric 5 x 5 power. Skin: No rashes, lesions or ulcers Psychiatry: Judgement and insight appear normal. Mood & affect appropriate.   Data Reviewed: I have personally reviewed following labs and imaging studies  CBC: Recent Labs  Lab 05/06/21 1132 05/07/21 0510  WBC 17.0* 14.2*  NEUTROABS 14.6* 11.9*  HGB 13.3 10.3*  HCT 40.8 32.5*  MCV 97.1 99.1  PLT 179 118*    Basic Metabolic Panel: Recent Labs  Lab 05/06/21 1132 05/07/21 0510  NA 135 134*  K 3.9 3.7  CL 97* 101  CO2 26 26  GLUCOSE 117* 91  BUN 15 21  CREATININE 1.06 1.11  CALCIUM 9.0 8.1*  MG  --  1.6*    GFR: Estimated Creatinine Clearance: 39.8 mL/min (by C-G formula based on SCr of 1.11 mg/dL).  Liver Function Tests: Recent Labs  Lab 05/06/21 1132 05/07/21 0510  AST 27  --   ALT 14  --   ALKPHOS 62  --   BILITOT 2.1* 1.3*  PROT 7.5  --   ALBUMIN 4.2  --     CBG: No results for input(s): GLUCAP in the last 168 hours.  Recent Results (from the past 240 hour(s))  Blood Culture (routine x 2)     Status: None (Preliminary result)   Collection Time: 05/06/21 11:32 AM   Specimen: Right Antecubital; Blood  Result Value Ref Range Status   Specimen Description RIGHT ANTECUBITAL  Final   Special Requests   Final    BOTTLES DRAWN AEROBIC AND ANAEROBIC Blood Culture results may not be optimal due to an inadequate volume of blood received in culture bottles   Culture   Final    NO GROWTH < 24 HOURS Performed at [facility], [address], [place], [place] [address]    Report Status PENDING  Incomplete  Resp Panel by RT-PCR (Flu A&B, Covid) Nasopharyngeal Swab     Status: None   Collection Time: 05/06/21 11:50 AM   Specimen: Nasopharyngeal Swab;  Nasopharyngeal(NP) swabs in vial transport medium  Result Value Ref Range Status   SARS Coronavirus 2 by RT PCR NEGATIVE NEGATIVE Final    Comment: (NOTE) SARS-CoV-2 target nucleic acids are NOT DETECTED.  The SARS-CoV-2 RNA is generally detectable in upper respiratory specimens during the acute phase of infection. The lowest concentration of SARS-CoV-2 viral copies this assay can detect is 138 copies/mL. A negative result does not preclude SARS-Cov-2 infection and should not be used as the sole basis for treatment or other patient management decisions. A negative result may occur with  improper specimen collection/handling, submission of specimen other than nasopharyngeal swab, presence of viral mutation(s) within the areas targeted by this assay, and inadequate number of viral copies(<138 copies/mL). A negative result must be combined with clinical observations, patient history, and epidemiological information. The expected result is Negative.  Fact Sheet for Patients:  BloggerCourse.com  Fact Sheet for Healthcare Providers:  IncredibleEmployment.be  This test is no t yet approved or cleared by the [place] FDA and  has been authorized for detection and/or diagnosis of SARS-CoV-2 by FDA under an Emergency Use Authorization (EUA). This EUA will remain  in effect (meaning this test can be used) for the duration of the COVID-19 declaration under Section 564(b)(1) of the Act, 21 U.S.C.section 360bbb-3(b)(1), unless the authorization is terminated  or revoked sooner.       Influenza A by PCR NEGATIVE NEGATIVE Final   Influenza B by PCR NEGATIVE NEGATIVE Final    Comment: (NOTE) The Xpert Xpress SARS-CoV-2/FLU/RSV plus assay is intended as an aid in the diagnosis of influenza from Nasopharyngeal swab specimens and should not be used as a sole basis for treatment. Nasal washings and aspirates are unacceptable for Xpert Xpress  SARS-CoV-2/FLU/RSV testing.  Fact Sheet for Patients: EntrepreneurPulse.com.au  Fact Sheet for Healthcare Providers: IncredibleEmployment.be  This test is not yet approved or cleared by the [place] FDA and has been authorized for detection and/or diagnosis of SARS-CoV-2 by FDA under an Emergency Use Authorization (EUA). This EUA will remain in effect (meaning this test can be used) for the duration of the COVID-19 declaration under Section 564(b)(1) of the Act, 21 U.S.C. section 360bbb-3(b)(1), unless the authorization is terminated or revoked.  Performed at [facility], [address] [place], [place] [address]   Blood Culture (routine x 2)     Status: None (Preliminary result)   Collection Time: 05/06/21 11:58 AM   Specimen: BLOOD RIGHT HAND  Result Value Ref Range Status   Specimen Description   Final    BLOOD RIGHT HAND Performed at [facility], [address] [place], [place] [address]    Special Requests   Final    BOTTLES DRAWN AEROBIC AND ANAEROBIC Blood Culture adequate volume Performed at [facility], [address] [place], [place] [address]    Culture  Setup Time   Final    GRAM NEGATIVE RODS Aerobic bottle Gram Stain Report Called to,Read Back By and Verified With: Parrish,P@[email or web address]  by Zigmund Daniel, b 11.15.22 CRITICAL RESULT CALLED TO, READ BACK BY AND VERIFIED WITH: PHARMD S HOLL [id number] AT 926 AM BY CM Performed at [facility], [place] [address] [place],  [address]    Culture GRAM NEGATIVE RODS  Final   Report Status PENDING  Incomplete  Blood Culture ID Panel (Reflexed)     Status: Abnormal   Collection Time: 05/06/21 11:58 AM  Result Value Ref Range Status   Enterococcus faecalis NOT DETECTED NOT DETECTED Final   Enterococcus Faecium NOT DETECTED NOT DETECTED Final   Listeria monocytogenes NOT DETECTED NOT DETECTED Final   Staphylococcus species NOT DETECTED NOT DETECTED Final   Staphylococcus aureus  (BCID) NOT DETECTED NOT DETECTED Final   Staphylococcus epidermidis NOT DETECTED NOT DETECTED Final   Staphylococcus lugdunensis NOT DETECTED NOT DETECTED Final   Streptococcus species NOT DETECTED NOT DETECTED Final   Streptococcus agalactiae NOT DETECTED NOT DETECTED Final   Streptococcus pneumoniae NOT DETECTED NOT DETECTED Final   Streptococcus pyogenes NOT DETECTED NOT DETECTED Final   A.calcoaceticus-baumannii NOT DETECTED NOT DETECTED Final   Bacteroides fragilis NOT DETECTED NOT DETECTED Final   Enterobacterales DETECTED (A) NOT DETECTED Final    Comment: Enterobacterales represent a large order of gram negative bacteria, not a single organism. CRITICAL RESULT CALLED TO, READ BACK BY AND VERIFIED WITH: PHARMD S HALL [id number] AT 927 AM BY CM  Enterobacter cloacae complex NOT DETECTED NOT DETECTED Final   Escherichia coli DETECTED (A) NOT DETECTED Final    Comment: CRITICAL RESULT CALLED TO, READ BACK BY AND VERIFIED WITH: PHARMD S HALL [id number] AT 926 M BY CM    Klebsiella aerogenes NOT DETECTED NOT DETECTED Final   Klebsiella oxytoca NOT DETECTED NOT DETECTED Final   Klebsiella pneumoniae NOT DETECTED NOT DETECTED Final   Proteus species NOT DETECTED NOT DETECTED Final   Salmonella species NOT DETECTED NOT DETECTED Final   Serratia marcescens NOT DETECTED NOT DETECTED Final   Haemophilus influenzae NOT DETECTED NOT DETECTED Final   Neisseria meningitidis NOT DETECTED NOT DETECTED Final   Pseudomonas aeruginosa NOT DETECTED NOT DETECTED Final   Stenotrophomonas maltophilia NOT DETECTED NOT DETECTED Final   Candida albicans NOT DETECTED NOT DETECTED Final   Candida auris NOT DETECTED NOT DETECTED Final   Candida glabrata NOT DETECTED NOT DETECTED Final   Candida krusei NOT DETECTED NOT DETECTED Final   Candida parapsilosis NOT DETECTED NOT DETECTED Final   Candida tropicalis NOT DETECTED NOT DETECTED Final   Cryptococcus neoformans/gattii NOT DETECTED NOT DETECTED Final    CTX-M ESBL NOT DETECTED NOT DETECTED Final   Carbapenem resistance IMP NOT DETECTED NOT DETECTED Final   Carbapenem resistance KPC NOT DETECTED NOT DETECTED Final   Carbapenem resistance NDM NOT DETECTED NOT DETECTED Final   Carbapenem resist OXA 48 LIKE NOT DETECTED NOT DETECTED Final   Carbapenem resistance VIM NOT DETECTED NOT DETECTED Final    Comment: Performed at [facility], [address], [place], [place] [address]     Radiology Studies: DG Chest Port 1 View  Result Date: 05/06/2021 CLINICAL DATA:  Questionable sepsis. EXAM: PORTABLE CHEST 1 VIEW COMPARISON:  11/26/2017 FINDINGS: The cardiomediastinal silhouette is unchanged with normal heart size. Aortic atherosclerosis is noted. The lungs are well inflated with mild chronic interstitial coarsening. No confluent airspace opacity, edema, sizable pleural effusion, or pneumothorax is identified. No acute osseous abnormality is seen. IMPRESSION: No active disease. Electronically Signed   By: Sebastian Ache M.D.   On: 05/06/2021 11:22   CT Renal Stone Study  Result Date: 05/06/2021 CLINICAL DATA:  Hematuria of unknown cause. Dysuria and incontinence. Patient fell 3-4 days ago. EXAM: CT ABDOMEN AND PELVIS WITHOUT CONTRAST TECHNIQUE: Multidetector CT imaging of the abdomen and pelvis was performed following the standard protocol without IV contrast. COMPARISON:  None. FINDINGS: Lower chest: Significant coronary artery calcifications. Heart size is normal. No pericardial effusion. Hepatobiliary: Calcified granulomata within the liver. Status post cholecystectomy. Pancreas: Unremarkable. No pancreatic ductal dilatation or surrounding inflammatory changes. Spleen: Normal in size without focal abnormality. Adrenals/Urinary Tract: Adrenal glands are normal. RIGHT kidney: LOWER pole cyst is 3.0 centimeters. There is no hydronephrosis. RIGHT ureter is unremarkable. LEFT kidney: 3 millimeter calculus identified in the midpole region. No  hydronephrosis. No renal mass. LEFT ureter is unremarkable. Bladder is decompressed with thickened wall. There is stranding surrounding the urinary bladder, consistent with cystitis or bladder outlet obstruction. Stomach/Bowel: Large hiatal hernia. Stomach and small bowel loops are otherwise normal in appearance. Numerous colonic diverticula without evidence for acute diverticulitis. The appendix is well seen and has a normal appearance. Vascular/Lymphatic: There is moderate atherosclerosis of the abdominal aorta and its branches. No adenopathy. Reproductive: Prostate is enlarged and partially calcified. Other: No ascites.  Abdominal wall is unremarkable. Musculoskeletal: Previous lumbar laminectomy. There has been fusion of L2-3. There is posterior listhesis of L3 on L4 by 9 millimeters. Marked LOWER  lumbar degenerative changes. No lytic or blastic lesions. IMPRESSION: 1. Thickened urinary bladder wall and stranding surrounding the bladder, favoring cystitis. Similar appearance can be seen with bladder outlet obstruction. 2. Prostatic enlargement. 3. Significant coronary artery disease. 4. Cholecystectomy. 5. Nephrolithiasis. 6. Large hiatal hernia. 7.  Aortic atherosclerosis.  (ICD10-I70.0) 8. Prior lumbar fusion and laminectomy. Degenerative changes in the LOWER lumbar spine. Electronically Signed   By: Nolon Nations M.D.   On: 05/06/2021 13:04    Scheduled Meds:  acetaminophen  650 mg Oral Q6H   Or   acetaminophen  650 mg Rectal Q6H   atorvastatin  10 mg Oral QPM   clopidogrel  75 mg Oral Q breakfast   heparin  5,000 Units Subcutaneous Q8H   loratadine  10 mg Oral Daily   tamsulosin  0.4 mg Oral QPC supper   Continuous Infusions:  cefTRIAXone (ROCEPHIN)  IV Stopped (05/06/21 1421)   lactated ringers 50 mL/hr at 05/07/21 0513   magnesium sulfate bolus IVPB      LOS: 1 day   Time spent: 35 minutes   Russell Engelstad Wynetta Emery, MD How to contact the [facility] Attending or Consulting provider [place] or  covering provider during after hours [place], for this patient?  Check the care team in [facility] and look for a) attending/consulting [facility] provider listed and b) the [facility] team listed Log into [email or web address] and use [place]'s universal password to access. If you do not have the password, please contact the hospital operator. Locate the [facility] provider you are looking for under [facility] and page to a number that you can be directly reached. If you still have difficulty reaching the provider, please page the [facility] (Director on Call) for the Hospitalists listed on amion for assistance.  05/07/2021, 10:34 AM

## 2021-05-07 NOTE — Evaluation (Signed)
Physical Therapy Evaluation Patient Details Name: Paul Bradshaw MRN: [id number] DOB: 25-Mar-1926 Today's Date: 05/07/2021  History of Present Illness  Paul Bradshaw is a 87 y.o. male presenting for evaluation of hematuria and dysuria. CT (+) for the appearance of cystitis; urinalysis abnormal with a WBC 11-20, large leukocytes, large hemoglobin. PMH: aortic regurgitation, BPH, cervical radiculopathy, HTN, prediabetes, stroke, TEE without cardioversion 2014 and 2015   Clinical Impression  Pt admitted with above diagnosis. Pt from home, independent without AD, cares for spouse as needed, still drives, has 2 step daughter's who can assist as needed. Pt currently min guard with bed mobility and transfers, limited ambulation with RW in room. Pt tolerates seated BLE strengthening exercises with mild soreness complaint in bil thighs. Educated pt on spending time OOB, exercises while in chair, and ambulating to restroom and in hallway with nursing as able to get steps in and pt verbalizes agreement. Recommending HHPT and assist PRN at home. Pt currently with functional limitations due to the deficits listed below (see PT Problem List). Pt will benefit from skilled PT to increase their independence and safety with mobility to allow discharge to the venue listed below.          Recommendations for follow up therapy are one component of a multi-disciplinary discharge planning process, led by the attending physician.  Recommendations may be updated based on patient status, additional functional criteria and insurance authorization.  Follow Up Recommendations Home health PT    Assistance Recommended at Discharge PRN  Functional Status Assessment Patient has had a recent decline in their functional status and demonstrates the ability to make significant improvements in function in a reasonable and predictable amount of time.  Equipment Recommendations  Rolling walker (2 wheels)    Recommendations for Other  Services       Precautions / Restrictions Precautions Precautions: Fall Restrictions Weight Bearing Restrictions: No      Mobility  Bed Mobility Overal bed mobility: Needs Assistance Bed Mobility: Supine to Sit  Supine to sit: Min guard  General bed mobility comments: use of bedrail and increased time to sit up EOB    Transfers Overall transfer level: Needs assistance Equipment used: Rolling walker (2 wheels) Transfers: Sit to/from Stand;Bed to chair/wheelchair/BSC Sit to Stand: Min guard Stand pivot transfers: Min guard  General transfer comment: min guard to steady with power to stand and pivot over to recliner using RW, VC for hand placement    Ambulation/Gait Ambulation/Gait assistance: Min guard Gait Distance (Feet): 20 Feet Assistive device: Rolling walker (2 wheels) Gait Pattern/deviations: Step-through pattern;Decreased stride length;Trunk flexed Gait velocity: decreased  General Gait Details: trunk slightly flexed, step through pattern with RW, slightly hesitant due to recent fall at home but no knee buckling or overt LOB  Stairs            Wheelchair Mobility    Modified Rankin (Stroke Patients Only)       Balance Overall balance assessment: Needs assistance Sitting-balance support: Feet supported Sitting balance-Leahy Scale: Fair Sitting balance - Comments: seated EOB   Standing balance support: During functional activity;Bilateral upper extremity supported;Reliant on assistive device for balance Standing balance-Leahy Scale: Poor       Pertinent Vitals/Pain Pain Assessment: No/denies pain    Home Living Family/patient expects to be discharged to:: Private residence Living Arrangements: Spouse/significant other Available Help at Discharge: Family;Available PRN/intermittently Type of Home: House Home Access: Stairs to enter;Ramped entrance Entrance Stairs-Rails: Right Entrance Stairs-Number of Steps: 6   Home  Layout: One level Home  Equipment: Cane - single point;Shower seat Additional Comments: pt reports he assists spouse who has a hospital bed, w/c and RW, 2 step daughters (nurses) are able ot assist as needed    Prior Function Prior Level of Function : Independent/Modified Independent  Mobility Comments: ind with ambulation without AD, 1 fall with SPC and no longer uses it ADLs Comments: ind with ADLs/IADLs, cares for disabled spouse     Hand Dominance   Dominant Hand: Right    Extremity/Trunk Assessment   Upper Extremity Assessment Upper Extremity Assessment: Overall WFL for tasks assessed    Lower Extremity Assessment Lower Extremity Assessment: Generalized weakness (AROM WNL, strength 4-/5 throughout BLE, denies numbness/tingling)    Cervical / Trunk Assessment Cervical / Trunk Assessment: Normal  Communication   Communication: HOH  Cognition Arousal/Alertness: Awake/alert Behavior During Therapy: WFL for tasks assessed/performed Overall Cognitive Status: Within Functional Limits for tasks assessed  General Comments: pt very pleasant and talkative        General Comments      Exercises General Exercises - Lower Extremity Long Arc Quad: Seated;AROM;Strengthening;Both;10 reps Hip Flexion/Marching: Seated;AROM;Strengthening;Both;10 reps   Assessment/Plan    PT Assessment Patient needs continued PT services  PT Problem List Decreased strength;Decreased activity tolerance;Decreased balance;Decreased knowledge of use of DME       PT Treatment Interventions DME instruction;Gait training;Functional mobility training;Therapeutic activities;Therapeutic exercise;Balance training;Patient/family education    PT Goals (Current goals can be found in the Care Plan section)  Acute Rehab PT Goals Patient Stated Goal: return home with family to assist PT Goal Formulation: With patient Time For Goal Achievement: 05/21/21 Potential to Achieve Goals: Good    Frequency Min 3X/week   Barriers to  discharge        Co-evaluation               AM-PAC PT "6 Clicks" Mobility  Outcome Measure Help needed turning from your back to your side while in a flat bed without using bedrails?: A Little Help needed moving from lying on your back to sitting on the side of a flat bed without using bedrails?: A Little Help needed moving to and from a bed to a chair (including a wheelchair)?: A Little Help needed standing up from a chair using your arms (e.g., wheelchair or bedside chair)?: A Little Help needed to walk in hospital room?: A Little Help needed climbing 3-5 steps with a railing? : A Lot 6 Click Score: 17    End of Session   Activity Tolerance: Patient tolerated treatment well Patient left: in chair;with call bell/phone within reach Nurse Communication: Mobility status PT Visit Diagnosis: Unsteadiness on feet (R26.81);Other abnormalities of gait and mobility (R26.89);Muscle weakness (generalized) (M62.81)    Time: [id number] PT Time Calculation (min) (ACUTE ONLY): 35 min   Charges:   PT Evaluation $PT Eval Low Complexity: 1 Low PT Treatments $Therapeutic Activity: 8-22 mins        Tori Steffan Caniglia PT, DPT 05/07/21, 12:31 PM

## 2021-05-07 NOTE — Progress Notes (Signed)
Date and time results received: 05/07/21 0412 (use smartphrase ".now" to insert current time)  Test: blood cultures Critical Value: aerobic gram negative rods  Name of Provider Notified: Dr. Victorino Dike  Orders Received? Or Actions Taken?: Actions Taken: MD on call notified

## 2021-05-08 DIAGNOSIS — A4151 Sepsis due to Escherichia coli [E. coli]: Secondary | ICD-10-CM

## 2021-05-08 LAB — CBC WITH DIFFERENTIAL/PLATELET
Abs Immature Granulocytes: 0.06 10*3/uL (ref 0.00–0.07)
Basophils Absolute: 0 10*3/uL (ref 0.0–0.1)
Basophils Relative: 0 %
Eosinophils Absolute: 0.2 10*3/uL (ref 0.0–0.5)
Eosinophils Relative: 1 %
HCT: 31.4 % — ABNORMAL LOW (ref 39.0–52.0)
Hemoglobin: 10.5 g/dL — ABNORMAL LOW (ref 13.0–17.0)
Immature Granulocytes: 1 %
Lymphocytes Relative: 11 %
Lymphs Abs: 1.2 10*3/uL (ref 0.7–4.0)
MCH: 32.7 pg (ref 26.0–34.0)
MCHC: 33.4 g/dL (ref 30.0–36.0)
MCV: 97.8 fL (ref 80.0–100.0)
Monocytes Absolute: 0.9 10*3/uL (ref 0.1–1.0)
Monocytes Relative: 8 %
Neutro Abs: 8.8 10*3/uL — ABNORMAL HIGH (ref 1.7–7.7)
Neutrophils Relative %: 79 %
Platelets: 138 10*3/uL — ABNORMAL LOW (ref 150–400)
RBC: 3.21 MIL/uL — ABNORMAL LOW (ref 4.22–5.81)
RDW: 12.5 % (ref 11.5–15.5)
WBC: 11.2 10*3/uL — ABNORMAL HIGH (ref 4.0–10.5)
nRBC: 0 % (ref 0.0–0.2)

## 2021-05-08 LAB — URINE CULTURE: Culture: >=100000 — AB

## 2021-05-08 LAB — COMPREHENSIVE METABOLIC PANEL
ALT: 15 U/L (ref 0–44)
AST: 35 U/L (ref 15–41)
Albumin: 2.8 g/dL — ABNORMAL LOW (ref 3.5–5.0)
Alkaline Phosphatase: 58 U/L (ref 38–126)
Anion gap: 6 (ref 5–15)
BUN: 16 mg/dL (ref 8–23)
CO2: 27 mmol/L (ref 22–32)
Calcium: 8.2 mg/dL — ABNORMAL LOW (ref 8.9–10.3)
Chloride: 102 mmol/L (ref 98–111)
Creatinine, Ser: 0.93 mg/dL (ref 0.61–1.24)
GFR, Estimated: >60 mL/min (ref >60)
Glucose, Bld: 89 mg/dL (ref 70–99)
Potassium: 3.5 mmol/L (ref 3.5–5.1)
Sodium: 135 mmol/L (ref 135–145)
Total Bilirubin: 0.5 mg/dL (ref 0.3–1.2)
Total Protein: 5.6 g/dL — ABNORMAL LOW (ref 6.5–8.1)

## 2021-05-08 NOTE — Progress Notes (Signed)
PROGRESS NOTE  Paul Bradshaw [id number] DOB: 05/23/1926 DOA: 05/06/2021 PCP: Loman Brooklyn, FNP  Brief History:  87 y.o. male with medical history significant for prediabetes mellitus, carotid artery stenosis, hyperlipidemia, hypertension, cervical radiculopathy, BPH, nephrolithiasis who has a history of staph aureus bacteremia in 2014.  He presented to the emergency department by EMS when family noted that he has been unable to get out of the bed for the past 3 to 4 days.  He started having incontinence dysuria hematuria 4 days ago.  He has had fever and chills.  He has had no abdominal pain.  He denies flank pain.  He has become progressively weaker over the past several days and formally had been independent but now has become bedbound.  He reports that he is afraid that he is having a recurrent bacteremia like he had in 2014.  He says the symptoms feel similar.  He reports cold shaking chills that have been intermittently bothering him for the past several days.  He was admitted with sepsis, UTI/ cystitis and dehydration.  Assessment/Plan: Severe Sepsis  -due UTI/Cystitis and bacteremia -present on admssion -presented with fever, leukocytosis, fever, -lactic acid peaked 2.2>>1.9 -Patient has been volume resuscitated with 30 cc/kg IV fluids -Continue IV ceftriaxone 2 g every 24 hours -Blood culture growing E. Coli   E. coli bacteremia -Pansensitive -Continue ceftriaxone 2 g IV every 24   Sinus tachycardia -Secondary to dehydration and sepsis -Treat as above   Leukocytosis -Secondary sepsis -Follow CBC with differential -WBC trending down with treatments    Prediabetes -hemoglobin A1c 5.8%  -Carbohydrate modified diet   Nephrolithiasis -CT renal study completed with no findings of obstructive disease -No hydronephrosis seen on CT scan   Generalized weakness and debility -Requested PT evaluation -Consult TOC for disposition needs    Hyperbilirubinemia -Secondary to sepsis -trending down with treatment -Fractionated bilirubin suggests unconjugated hyperbilirubinemia -Continue resuscitation and sepsis management as above -CMP in AM   Lactic acidosis - RESOLVED  -IV fluid hydration as ordered -Follow lactic acid to resolution      Status is: Inpatient  Remains inpatient appropriate because: severity of illness requiring IV fluid and IV abx        Family Communication:   no Family at bedside  Consultants:  none  Code Status:  FULL   DVT Prophylaxis:  [place] Heparin    Procedures: As Listed in Progress Note Above  Antibiotics: Ceftriaxone 11/14>>      Subjective: Patient denies fevers, chills, headache, chest pain, dyspnea, nausea, vomiting, diarrhea, abdominal pain, dysuria, hematuria, hematochezia, and melena.   Objective: Vitals:   05/07/21 0515 05/07/21 1401 05/07/21 2135 05/08/21 0638  BP: (!) 103/45 (!) 134/58 (!) 105/54   Pulse: 77 (!) 102 97 80  Resp: 18 17 19 19   Temp: 99 F (37.2 C) 98.9 F (37.2 C) 99.2 F (37.3 C) 99.1 F (37.3 C)  TempSrc:  Oral Oral Oral  SpO2: 97% 98% 97% 97%  Weight:      Height:        Intake/Output Summary (Last 24 hours) at 05/08/2021 1810 Last data filed at 05/08/2021 [id number] Gross per 24 hour  Intake 725.36 ml  Output 1550 ml  Net -824.64 ml   Weight change:  Exam:  General:  Pt is alert, follows commands appropriately, not in acute distress HEENT: No icterus, No thrush, No neck mass, [place]/AT Cardiovascular: RRR, S1/S2, no rubs, no gallops Respiratory: CTA  bilaterally, no wheezing, no crackles, no rhonchi Abdomen: Soft/+BS, non tender, non distended, no guarding Extremities: No edema, No lymphangitis, No petechiae, No rashes, no synovitis   Data Reviewed: I have personally reviewed following labs and imaging studies Basic Metabolic Panel: Recent Labs  Lab 05/06/21 1132 05/07/21 0510 05/08/21 0453  NA 135 134* 135  K 3.9 3.7 3.5   CL 97* 101 102  CO2 26 26 27   GLUCOSE 117* 91 89  BUN 15 21 16   CREATININE 1.06 1.11 0.93  CALCIUM 9.0 8.1* 8.2*  MG  --  1.6*  --    Liver Function Tests: Recent Labs  Lab 05/06/21 1132 05/07/21 0510 05/08/21 0453  AST 27  --  35  ALT 14  --  15  ALKPHOS 62  --  58  BILITOT 2.1* 1.3* 0.5  PROT 7.5  --  5.6*  ALBUMIN 4.2  --  2.8*   No results for input(s): LIPASE, AMYLASE in the last 168 hours. No results for input(s): AMMONIA in the last 168 hours. Coagulation Profile: Recent Labs  Lab 05/06/21 1220  INR 1.2   CBC: Recent Labs  Lab 05/06/21 1132 05/07/21 0510 05/08/21 0453  WBC 17.0* 14.2* 11.2*  NEUTROABS 14.6* 11.9* 8.8*  HGB 13.3 10.3* 10.5*  HCT 40.8 32.5* 31.4*  MCV 97.1 99.1 97.8  PLT 179 118* 138*   Cardiac Enzymes: No results for input(s): CKTOTAL, CKMB, CKMBINDEX, TROPONINI in the last 168 hours. BNP: Invalid input(s): POCBNP CBG: No results for input(s): GLUCAP in the last 168 hours. HbA1C: No results for input(s): HGBA1C in the last 72 hours. Urine analysis:    Component Value Date/Time   COLORURINE AMBER (A) 05/06/2021 1312   APPEARANCEUR HAZY (A) 05/06/2021 1312   LABSPEC 1.020 05/06/2021 1312   PHURINE 5.0 05/06/2021 1312   GLUCOSEU NEGATIVE 05/06/2021 1312   HGBUR LARGE (A) 05/06/2021 1312   BILIRUBINUR NEGATIVE 05/06/2021 1312   KETONESUR 20 (A) 05/06/2021 1312   PROTEINUR 100 (A) 05/06/2021 1312   UROBILINOGEN 2.0 (H) 08/08/2012 1104   NITRITE NEGATIVE 05/06/2021 1312   LEUKOCYTESUR LARGE (A) 05/06/2021 1312   Sepsis Labs: @LABRCNTIP (procalcitonin:4,lacticidven:4) ) Recent Results (from the past 240 hour(s))  Blood Culture (routine x 2)     Status: None (Preliminary result)   Collection Time: 05/06/21 11:32 AM   Specimen: Right Antecubital; Blood  Result Value Ref Range Status   Specimen Description RIGHT ANTECUBITAL  Final   Special Requests   Final    BOTTLES DRAWN AEROBIC AND ANAEROBIC Blood Culture results may not be  optimal due to an inadequate volume of blood received in culture bottles   Culture   Final    NO GROWTH 2 DAYS Performed at [facility], [address] [place], [place] [address]    Report Status PENDING  Incomplete  Resp Panel by RT-PCR (Flu A&B, Covid) Nasopharyngeal Swab     Status: None   Collection Time: 05/06/21 11:50 AM   Specimen: Nasopharyngeal Swab; Nasopharyngeal(NP) swabs in vial transport medium  Result Value Ref Range Status   SARS Coronavirus 2 by RT PCR NEGATIVE NEGATIVE Final    Comment: (NOTE) SARS-CoV-2 target nucleic acids are NOT DETECTED.  The SARS-CoV-2 RNA is generally detectable in upper respiratory specimens during the acute phase of infection. The lowest concentration of SARS-CoV-2 viral copies this assay can detect is 138 copies/mL. A negative result does not preclude SARS-Cov-2 infection and should not be used as the sole basis for treatment or other patient  management decisions. A negative result may occur with  improper specimen collection/handling, submission of specimen other than nasopharyngeal swab, presence of viral mutation(s) within the areas targeted by this assay, and inadequate number of viral copies(<138 copies/mL). A negative result must be combined with clinical observations, patient history, and epidemiological information. The expected result is Negative.  Fact Sheet for Patients:  EntrepreneurPulse.com.au  Fact Sheet for Healthcare Providers:  IncredibleEmployment.be  This test is no t yet approved or cleared by the [place] FDA and  has been authorized for detection and/or diagnosis of SARS-CoV-2 by FDA under an Emergency Use Authorization (EUA). This EUA will remain  in effect (meaning this test can be used) for the duration of the COVID-19 declaration under Section 564(b)(1) of the Act, 21 U.S.C.section 360bbb-3(b)(1), unless the authorization is terminated  or revoked sooner.        Influenza A by PCR NEGATIVE NEGATIVE Final   Influenza B by PCR NEGATIVE NEGATIVE Final    Comment: (NOTE) The Xpert Xpress SARS-CoV-2/FLU/RSV plus assay is intended as an aid in the diagnosis of influenza from Nasopharyngeal swab specimens and should not be used as a sole basis for treatment. Nasal washings and aspirates are unacceptable for Xpert Xpress SARS-CoV-2/FLU/RSV testing.  Fact Sheet for Patients: EntrepreneurPulse.com.au  Fact Sheet for Healthcare Providers: IncredibleEmployment.be  This test is not yet approved or cleared by the [place] FDA and has been authorized for detection and/or diagnosis of SARS-CoV-2 by FDA under an Emergency Use Authorization (EUA). This EUA will remain in effect (meaning this test can be used) for the duration of the COVID-19 declaration under Section 564(b)(1) of the Act, 21 U.S.C. section 360bbb-3(b)(1), unless the authorization is terminated or revoked.  Performed at [facility], [address], [place], [place] [address]   Blood Culture (routine x 2)     Status: Abnormal (Preliminary result)   Collection Time: 05/06/21 11:58 AM   Specimen: BLOOD RIGHT HAND  Result Value Ref Range Status   Specimen Description   Final    BLOOD RIGHT HAND Performed at [facility], [address] [place], [place] [address]    Special Requests   Final    BOTTLES DRAWN AEROBIC AND ANAEROBIC Blood Culture adequate volume Performed at [facility], [address] [place], [place] [address]    Culture  Setup Time   Final    GRAM NEGATIVE RODS Aerobic bottle Gram Stain Report Called to,Read Back By and Verified With: Parrish,P@0410  by Zigmund Daniel, b 11.15.22 CRITICAL RESULT CALLED TO, READ BACK BY AND VERIFIED WITH: PHARMD S HOLL [id number] AT 926 AM BY CM    Culture (A)  Final    ESCHERICHIA COLI SUSCEPTIBILITIES TO FOLLOW Performed at [facility], [place] [address], [place],  [address]    Report Status  PENDING  Incomplete  Blood Culture ID Panel (Reflexed)     Status: Abnormal   Collection Time: 05/06/21 11:58 AM  Result Value Ref Range Status   Enterococcus faecalis NOT DETECTED NOT DETECTED Final   Enterococcus Faecium NOT DETECTED NOT DETECTED Final   Listeria monocytogenes NOT DETECTED NOT DETECTED Final   Staphylococcus species NOT DETECTED NOT DETECTED Final   Staphylococcus aureus (BCID) NOT DETECTED NOT DETECTED Final   Staphylococcus epidermidis NOT DETECTED NOT DETECTED Final   Staphylococcus lugdunensis NOT DETECTED NOT DETECTED Final   Streptococcus species NOT DETECTED NOT DETECTED Final   Streptococcus agalactiae NOT DETECTED NOT DETECTED Final   Streptococcus pneumoniae NOT DETECTED NOT DETECTED Final  Streptococcus pyogenes NOT DETECTED NOT DETECTED Final   A.calcoaceticus-baumannii NOT DETECTED NOT DETECTED Final   Bacteroides fragilis NOT DETECTED NOT DETECTED Final   Enterobacterales DETECTED (A) NOT DETECTED Final    Comment: Enterobacterales represent a large order of gram negative bacteria, not a single organism. CRITICAL RESULT CALLED TO, READ BACK BY AND VERIFIED WITH: PHARMD S HALL [id number] AT 59 AM BY CM    Enterobacter cloacae complex NOT DETECTED NOT DETECTED Final   Escherichia coli DETECTED (A) NOT DETECTED Final    Comment: CRITICAL RESULT CALLED TO, READ BACK BY AND VERIFIED WITH: PHARMD S HALL [id number] AT 68 M BY CM    Klebsiella aerogenes NOT DETECTED NOT DETECTED Final   Klebsiella oxytoca NOT DETECTED NOT DETECTED Final   Klebsiella pneumoniae NOT DETECTED NOT DETECTED Final   Proteus species NOT DETECTED NOT DETECTED Final   Salmonella species NOT DETECTED NOT DETECTED Final   Serratia marcescens NOT DETECTED NOT DETECTED Final   Haemophilus influenzae NOT DETECTED NOT DETECTED Final   Neisseria meningitidis NOT DETECTED NOT DETECTED Final   Pseudomonas aeruginosa NOT DETECTED NOT DETECTED Final   Stenotrophomonas maltophilia NOT DETECTED NOT  DETECTED Final   Candida albicans NOT DETECTED NOT DETECTED Final   Candida auris NOT DETECTED NOT DETECTED Final   Candida glabrata NOT DETECTED NOT DETECTED Final   Candida krusei NOT DETECTED NOT DETECTED Final   Candida parapsilosis NOT DETECTED NOT DETECTED Final   Candida tropicalis NOT DETECTED NOT DETECTED Final   Cryptococcus neoformans/gattii NOT DETECTED NOT DETECTED Final   CTX-M ESBL NOT DETECTED NOT DETECTED Final   Carbapenem resistance IMP NOT DETECTED NOT DETECTED Final   Carbapenem resistance KPC NOT DETECTED NOT DETECTED Final   Carbapenem resistance NDM NOT DETECTED NOT DETECTED Final   Carbapenem resist OXA 48 LIKE NOT DETECTED NOT DETECTED Final   Carbapenem resistance VIM NOT DETECTED NOT DETECTED Final    Comment: Performed at [facility], [address] [place], [place] [address]  Urine Culture     Status: Abnormal   Collection Time: 05/06/21  1:13 PM   Specimen: Urine, Catheterized  Result Value Ref Range Status   Specimen Description   Final    URINE, CATHETERIZED Performed at [facility], [address], [place], [place] [address]    Special Requests   Final    NONE Performed at [facility], [address] [place], [place] [address]    Culture >=100,000 COLONIES/mL ESCHERICHIA COLI (A)  Final   Report Status 05/08/2021 FINAL  Final   Organism ID, Bacteria ESCHERICHIA COLI (A)  Final      Susceptibility   Escherichia coli - MIC*    AMPICILLIN <=2 SENSITIVE Sensitive     CEFAZOLIN <=4 SENSITIVE Sensitive     CEFEPIME <=0.12 SENSITIVE Sensitive     CEFTRIAXONE <=0.25 SENSITIVE Sensitive     CIPROFLOXACIN 0.5 INTERMEDIATE Intermediate     GENTAMICIN <=1 SENSITIVE Sensitive     IMIPENEM <=0.25 SENSITIVE Sensitive     NITROFURANTOIN <=16 SENSITIVE Sensitive     TRIMETH/SULFA <=20 SENSITIVE Sensitive     AMPICILLIN/SULBACTAM <=2 SENSITIVE Sensitive     PIP/TAZO <=4 SENSITIVE Sensitive     * >=100,000 COLONIES/mL ESCHERICHIA COLI      Scheduled Meds:  acetaminophen  650 mg Oral Q6H   Or   acetaminophen  650 mg Rectal Q6H   atorvastatin  10 mg Oral QPM   clopidogrel  75 mg Oral Q breakfast   heparin  5,000 Units Subcutaneous Q8H   loratadine  10 mg Oral Daily   tamsulosin  0.4 mg Oral QPC supper   Continuous Infusions:  cefTRIAXone (ROCEPHIN)  IV 2 g (05/08/21 1701)   lactated ringers 1,000 mL (05/08/21 1659)    Procedures/Studies: DG Chest Port 1 View  Result Date: 05/06/2021 CLINICAL DATA:  Questionable sepsis. EXAM: PORTABLE CHEST 1 VIEW COMPARISON:  11/26/2017 FINDINGS: The cardiomediastinal silhouette is unchanged with normal heart size. Aortic atherosclerosis is noted. The lungs are well inflated with mild chronic interstitial coarsening. No confluent airspace opacity, edema, sizable pleural effusion, or pneumothorax is identified. No acute osseous abnormality is seen. IMPRESSION: No active disease. Electronically Signed   By: Sebastian Ache M.D.   On: 05/06/2021 11:22   CT Renal Stone Study  Result Date: 05/06/2021 CLINICAL DATA:  Hematuria of unknown cause. Dysuria and incontinence. Patient fell 3-4 days ago. EXAM: CT ABDOMEN AND PELVIS WITHOUT CONTRAST TECHNIQUE: Multidetector CT imaging of the abdomen and pelvis was performed following the standard protocol without IV contrast. COMPARISON:  None. FINDINGS: Lower chest: Significant coronary artery calcifications. Heart size is normal. No pericardial effusion. Hepatobiliary: Calcified granulomata within the liver. Status post cholecystectomy. Pancreas: Unremarkable. No pancreatic ductal dilatation or surrounding inflammatory changes. Spleen: Normal in size without focal abnormality. Adrenals/Urinary Tract: Adrenal glands are normal. RIGHT kidney: LOWER pole cyst is 3.0 centimeters. There is no hydronephrosis. RIGHT ureter is unremarkable. LEFT kidney: 3 millimeter calculus identified in the midpole region. No hydronephrosis. No renal mass. LEFT ureter is  unremarkable. Bladder is decompressed with thickened wall. There is stranding surrounding the urinary bladder, consistent with cystitis or bladder outlet obstruction. Stomach/Bowel: Large hiatal hernia. Stomach and small bowel loops are otherwise normal in appearance. Numerous colonic diverticula without evidence for acute diverticulitis. The appendix is well seen and has a normal appearance. Vascular/Lymphatic: There is moderate atherosclerosis of the abdominal aorta and its branches. No adenopathy. Reproductive: Prostate is enlarged and partially calcified. Other: No ascites.  Abdominal wall is unremarkable. Musculoskeletal: Previous lumbar laminectomy. There has been fusion of L2-3. There is posterior listhesis of L3 on L4 by 9 millimeters. Marked LOWER lumbar degenerative changes. No lytic or blastic lesions. IMPRESSION: 1. Thickened urinary bladder wall and stranding surrounding the bladder, favoring cystitis. Similar appearance can be seen with bladder outlet obstruction. 2. Prostatic enlargement. 3. Significant coronary artery disease. 4. Cholecystectomy. 5. Nephrolithiasis. 6. Large hiatal hernia. 7.  Aortic atherosclerosis.  (ICD10-I70.0) 8. Prior lumbar fusion and laminectomy. Degenerative changes in the LOWER lumbar spine. Electronically Signed   By: Norva Pavlov M.D.   On: 05/06/2021 13:04    Catarina Hartshorn, DO  [facility]  If 7PM-7AM, please contact night-coverage [email or web address] Password [email or web address] 05/08/2021, 6:10 PM   LOS: 2 days

## 2021-05-08 NOTE — Progress Notes (Signed)
Physical Therapy Treatment Patient Details Name: Paul Bradshaw MRN: [id number] DOB: 10-07-1925 Today's Date: 05/08/2021   History of Present Illness Paul Bradshaw is a 87 y.o. male presenting for evaluation of hematuria and dysuria. CT (+) for the appearance of cystitis; urinalysis abnormal with a WBC 11-20, large leukocytes, large hemoglobin. PMH: aortic regurgitation, BPH, cervical radiculopathy, HTN, prediabetes, stroke, TEE without cardioversion 2014 and 2015    PT Comments    Patient demonstrates increased endurance/distance for ambulation without loss of balance with slightly labored cadence, flexed trunk and limited mostly due to fatigue.  Patient requires occasional verbal cueing and demonstration for completing exercises possibly due to [facility] and tolerated sitting up on commode to attempt bowel movement after therapy - NT notified.  Patient will benefit from continued physical therapy in hospital and recommended venue below to increase strength, balance, endurance for safe ADLs and gait.    Recommendations for follow up therapy are one component of a multi-disciplinary discharge planning process, led by the attending physician.  Recommendations may be updated based on patient status, additional functional criteria and insurance authorization.  Follow Up Recommendations  Home health PT     Assistance Recommended at Discharge PRN  Equipment Recommendations  Rolling walker (2 wheels)    Recommendations for Other Services       Precautions / Restrictions Precautions Precautions: Fall Restrictions Weight Bearing Restrictions: No     Mobility  Bed Mobility Overal bed mobility: Modified Independent Bed Mobility: Supine to Sit     Supine to sit: Supervision;HOB elevated     General bed mobility comments: increased time, slightly labored movement mostly due not wanting to pull off male perwick    Transfers Overall transfer level: Needs assistance Equipment used: Rolling  walker (2 wheels) Transfers: Sit to/from Stand;Bed to chair/wheelchair/BSC Sit to Stand: Supervision Stand pivot transfers: Supervision;Min guard         General transfer comment: good return for transferring to commode in bathroom with use side rail    Ambulation/Gait Ambulation/Gait assistance: Supervision;Min guard Gait Distance (Feet): 80 Feet Assistive device: Rolling walker (2 wheels) Gait Pattern/deviations: Decreased step length - right;Decreased step length - left;Decreased stride length;Trunk flexed Gait velocity: decreased     General Gait Details: demonstrated increased endurance/distance with slow slightly labored cadence with flexed trunk, no loss of balance   Stairs             Wheelchair Mobility    Modified Rankin (Stroke Patients Only)       Balance Overall balance assessment: Needs assistance Sitting-balance support: Feet supported;No upper extremity supported Sitting balance-Leahy Scale: Good Sitting balance - Comments: seated EOB   Standing balance support: Reliant on assistive device for balance;During functional activity;Bilateral upper extremity supported Standing balance-Leahy Scale: Fair Standing balance comment: using RW                            Cognition Arousal/Alertness: Awake/alert Behavior During Therapy: WFL for tasks assessed/performed Overall Cognitive Status: Within Functional Limits for tasks assessed                                          Exercises General Exercises - Lower Extremity Long Arc Quad: Seated;AROM;Strengthening;Both;10 reps Hip Flexion/Marching: Seated;AROM;Strengthening;Both;10 reps Toe Raises: Seated;AROM;Strengthening;Both;10 reps Heel Raises: Seated;AROM;Strengthening;Both;10 reps    General Comments  Pertinent Vitals/Pain Pain Assessment: No/denies pain    Home Living                          Prior Function            PT Goals (current  goals can now be found in the care plan section) Acute Rehab PT Goals Patient Stated Goal: return home with family to assist PT Goal Formulation: With patient Time For Goal Achievement: 05/21/21 Potential to Achieve Goals: Good Progress towards PT goals: Progressing toward goals    Frequency    Min 3X/week      PT Plan Current plan remains appropriate    Co-evaluation              AM-PAC PT "6 Clicks" Mobility   Outcome Measure  Help needed turning from your back to your side while in a flat bed without using bedrails?: None Help needed moving from lying on your back to sitting on the side of a flat bed without using bedrails?: A Little Help needed moving to and from a bed to a chair (including a wheelchair)?: A Little Help needed standing up from a chair using your arms (e.g., wheelchair or bedside chair)?: A Little Help needed to walk in hospital room?: A Little Help needed climbing 3-5 steps with a railing? : A Little 6 Click Score: 19    End of Session   Activity Tolerance: Patient tolerated treatment well;Patient limited by fatigue Patient left: with call bell/phone within reach;Other (comment) (left seated on commode in bathroom) Nurse Communication: Mobility status PT Visit Diagnosis: Unsteadiness on feet (R26.81);Other abnormalities of gait and mobility (R26.89);Muscle weakness (generalized) (M62.81)     Time: 2947-6546 PT Time Calculation (min) (ACUTE ONLY): 20 min  Charges:  $Gait Training: 8-22 mins $Therapeutic Exercise: 8-22 mins                     3:45 PM, 05/08/21 Paul Bradshaw, MPT Physical Therapist with [facility] 336 [phone] office [phone] mobile phone

## 2021-05-08 NOTE — Plan of Care (Signed)
  Problem: Education: Goal: Knowledge of General Education information will improve Description Including pain rating scale, medication(s)/side effects and non-pharmacologic comfort measures Outcome: Progressing   Problem: Health Behavior/Discharge Planning: Goal: Ability to manage health-related needs will improve Outcome: Progressing   

## 2021-05-09 DIAGNOSIS — E782 Mixed hyperlipidemia: Secondary | ICD-10-CM

## 2021-05-09 LAB — CULTURE, BLOOD (ROUTINE X 2): Special Requests: ADEQUATE

## 2021-05-09 LAB — CBC
HCT: 34.7 % — ABNORMAL LOW (ref 39.0–52.0)
Hemoglobin: 11.3 g/dL — ABNORMAL LOW (ref 13.0–17.0)
MCH: 32 pg (ref 26.0–34.0)
MCHC: 32.6 g/dL (ref 30.0–36.0)
MCV: 98.3 fL (ref 80.0–100.0)
Platelets: 176 10*3/uL (ref 150–400)
RBC: 3.53 MIL/uL — ABNORMAL LOW (ref 4.22–5.81)
RDW: 12.5 % (ref 11.5–15.5)
WBC: 9.7 10*3/uL (ref 4.0–10.5)
nRBC: 0 % (ref 0.0–0.2)

## 2021-05-09 LAB — BASIC METABOLIC PANEL
Anion gap: 6 (ref 5–15)
BUN: 12 mg/dL (ref 8–23)
CO2: 28 mmol/L (ref 22–32)
Calcium: 8.8 mg/dL — ABNORMAL LOW (ref 8.9–10.3)
Chloride: 103 mmol/L (ref 98–111)
Creatinine, Ser: 0.84 mg/dL (ref 0.61–1.24)
GFR, Estimated: >60 mL/min (ref >60)
Glucose, Bld: 98 mg/dL (ref 70–99)
Potassium: 3.7 mmol/L (ref 3.5–5.1)
Sodium: 137 mmol/L (ref 135–145)

## 2021-05-09 LAB — MAGNESIUM: Magnesium: 2 mg/dL (ref 1.7–2.4)

## 2021-05-09 MED ORDER — SODIUM CHLORIDE 0.9 % IV SOLN
2.0000 g | INTRAVENOUS | Status: DC
  Administered 2021-05-09: 12:00:00 2 g via INTRAVENOUS
  Filled 2021-05-09: qty 20

## 2021-05-09 MED ORDER — CIPROFLOXACIN HCL 250 MG PO TABS: 500.0000 mg | ORAL_TABLET | Freq: Two times a day (BID) | ORAL | Status: DC

## 2021-05-09 MED ORDER — BISACODYL 10 MG RE SUPP: 10.0000 mg | Freq: Once | RECTAL | Status: DC

## 2021-05-09 MED ORDER — POLYETHYLENE GLYCOL 3350 17 G PO PACK: 17.0000 g | PACK | Freq: Every day | ORAL | Status: DC

## 2021-05-09 MED ORDER — CIPROFLOXACIN HCL 500 MG PO TABS: 500.0000 mg | ORAL_TABLET | Freq: Two times a day (BID) | ORAL | 0 refills | Status: DC

## 2021-05-09 NOTE — TOC Transition Note (Signed)
Transition of Care [facility]) - CM/SW Discharge Note   Patient Details  Name: Paul Bradshaw MRN: [id number] Date of Birth: 1926/04/15  Transition of Care [facility]) CM/SW Contact:  Leitha Bleak, RN Phone Number: 05/09/2021, 1:28 PM   Clinical Narrative:   Patient admitted with cystitis. PT recommending HHPT. [facility]. Bonita Quin updated with discharge plan to go home today.   Final next level of care: Home w Home Health Services Barriers to Discharge: Barriers Resolved       Discharge Placement             Discharge Plan and Services      HH Arranged: PT [facility] Agency: [facility] Date HH Agency Contacted: 05/07/21 Time HH Agency Contacted: 1500 Representative spoke with at [facility] Agency: [place]  Readmission Risk Interventions No flowsheet data found.

## 2021-05-09 NOTE — Discharge Summary (Signed)
Physician Discharge Summary  Paul Bradshaw [id number] DOB: [birth year] DOA: 05/06/2021  PCP: Loman Brooklyn, FNP  Admit date: 05/06/2021 Discharge date: 05/09/2021  Admitted From: Home Disposition:  Home  Recommendations for Outpatient Follow-up:  Follow up with PCP in 1-2 weeks Please obtain BMP/CBC in one week   Home Health:HHPT   Discharge Condition: Stable CODE STATUS:FULL Diet recommendation: Heart Healthy   Brief/Interim Summary: 87 y.o. male with medical history significant for prediabetes mellitus, carotid artery stenosis, hyperlipidemia, hypertension, cervical radiculopathy, BPH, nephrolithiasis who has a history of staph aureus bacteremia in 2014.  He presented to the emergency department by EMS when family noted that he has been unable to get out of the bed for the past 3 to 4 days.  He started having incontinence dysuria hematuria 4 days ago.  He has had fever and chills.  He has had no abdominal pain.  He denies flank pain.  He has become progressively weaker over the past several days and formally had been independent but now has become bedbound.  He reports that he is afraid that he is having a recurrent bacteremia like he had in 2014.  He says the symptoms feel similar.  He reports cold shaking chills that have been intermittently bothering him for the past several days.  He was admitted with sepsis, UTI/ cystitis and dehydration.  He was started on IVF and ceftriaxone with clinical improvement.  Discharge Diagnoses:   Severe Sepsis  -due UTI/Cystitis and bacteremia -present on admssion -presented with fever, leukocytosis, fever, -lactic acid peaked 2.2>>1.9 -Patient has been volume resuscitated with 30 cc/kg IV fluids -Continue IV ceftriaxone 2 g every 24 hours -Blood culture growing E. Coli   E. coli bacteremia -Pansensitive -Continue ceftriaxone 2 g IV every 24 -d/c home with po cipro x 6 more days to complete 10 days tx   Sinus  tachycardia -Secondary to dehydration and sepsis -improved   Leukocytosis -Secondary sepsis -Follow CBC with differential -WBC improved with treatments    Prediabetes -hemoglobin A1c 5.8%  -Carbohydrate modified diet   Nephrolithiasis -CT renal study completed with no findings of obstructive disease -No hydronephrosis seen on CT scan   Generalized weakness and debility -Requested PT evaluation>>HHPT -Consult TOC for disposition needs   Hyperbilirubinemia -Secondary to sepsis -trending down with treatment -Fractionated bilirubin suggests unconjugated hyperbilirubinemia -Continue resuscitation and sepsis management as above -CMP in AM   Lactic acidosis - RESOLVED  -IV fluid hydration as ordered -Follow lactic acid to resolution   Discharge Instructions   Allergies as of 05/09/2021       Reactions   Amlodipine Swelling   Swelling of lips.   Lisinopril Other (See Comments)   Elevated BP higher & causes a burning feeling on the inside.         Medication List     TAKE these medications    acetaminophen 500 MG tablet Commonly known as: TYLENOL Take 1,000 mg by mouth every 8 (eight) hours as needed for moderate pain.   atorvastatin 10 MG tablet Commonly known as: LIPITOR TAKE ONE-HALF TABLET BY  MOUTH DAILY AT 6 PM What changed: See the new instructions.   cetirizine 10 MG tablet Commonly known as: ZYRTEC Take 10 mg by mouth daily.   ciprofloxacin 500 MG tablet Commonly known as: CIPRO Take 1 tablet (500 mg total) by mouth 2 (two) times daily. Start taking on: May 10, 2021   clopidogrel 75 MG tablet Commonly known as: PLAVIX TAKE 1 TABLET BY MOUTH  DAILY WITH BREAKFAST   MULTIVITAMIN PO Take 1 tablet by mouth daily.   tamsulosin 0.4 MG Caps capsule Commonly known as: FLOMAX TAKE 1 CAPSULE BY MOUTH IN  THE EVENING What changed: See the new instructions.   Turmeric 1053 MG Tabs Take by mouth.        Follow-up Information      [facility].   Specialty: Home Health Services Why: PT will call to schedule your first home visit.               Allergies  Allergen Reactions   Amlodipine Swelling    Swelling of lips.   Lisinopril Other (See Comments)    Elevated BP higher & causes a burning feeling on the inside.     Consultations: none   Procedures/Studies: DG Chest Port 1 View  Result Date: 05/06/2021 CLINICAL DATA:  Questionable sepsis. EXAM: PORTABLE CHEST 1 VIEW COMPARISON:  11/26/2017 FINDINGS: The cardiomediastinal silhouette is unchanged with normal heart size. Aortic atherosclerosis is noted. The lungs are well inflated with mild chronic interstitial coarsening. No confluent airspace opacity, edema, sizable pleural effusion, or pneumothorax is identified. No acute osseous abnormality is seen. IMPRESSION: No active disease. Electronically Signed   By: Logan Bores M.D.   On: 05/06/2021 11:22   CT Renal Stone Study  Result Date: 05/06/2021 CLINICAL DATA:  Hematuria of unknown cause. Dysuria and incontinence. Patient fell 3-4 days ago. EXAM: CT ABDOMEN AND PELVIS WITHOUT CONTRAST TECHNIQUE: Multidetector CT imaging of the abdomen and pelvis was performed following the standard protocol without IV contrast. COMPARISON:  None. FINDINGS: Lower chest: Significant coronary artery calcifications. Heart size is normal. No pericardial effusion. Hepatobiliary: Calcified granulomata within the liver. Status post cholecystectomy. Pancreas: Unremarkable. No pancreatic ductal dilatation or surrounding inflammatory changes. Spleen: Normal in size without focal abnormality. Adrenals/Urinary Tract: Adrenal glands are normal. RIGHT kidney: LOWER pole cyst is 3.0 centimeters. There is no hydronephrosis. RIGHT ureter is unremarkable. LEFT kidney: 3 millimeter calculus identified in the midpole region. No hydronephrosis. No renal mass. LEFT ureter is unremarkable. Bladder is decompressed with  thickened wall. There is stranding surrounding the urinary bladder, consistent with cystitis or bladder outlet obstruction. Stomach/Bowel: Large hiatal hernia. Stomach and small bowel loops are otherwise normal in appearance. Numerous colonic diverticula without evidence for acute diverticulitis. The appendix is well seen and has a normal appearance. Vascular/Lymphatic: There is moderate atherosclerosis of the abdominal aorta and its branches. No adenopathy. Reproductive: Prostate is enlarged and partially calcified. Other: No ascites.  Abdominal wall is unremarkable. Musculoskeletal: Previous lumbar laminectomy. There has been fusion of L2-3. There is posterior listhesis of L3 on L4 by 9 millimeters. Marked LOWER lumbar degenerative changes. No lytic or blastic lesions. IMPRESSION: 1. Thickened urinary bladder wall and stranding surrounding the bladder, favoring cystitis. Similar appearance can be seen with bladder outlet obstruction. 2. Prostatic enlargement. 3. Significant coronary artery disease. 4. Cholecystectomy. 5. Nephrolithiasis. 6. Large hiatal hernia. 7.  Aortic atherosclerosis.  (ICD10-I70.0) 8. Prior lumbar fusion and laminectomy. Degenerative changes in the LOWER lumbar spine. Electronically Signed   By: Nolon Nations M.D.   On: 05/06/2021 13:04        Discharge Exam: Vitals:   05/08/21 2126 05/09/21 0622  BP: (!) 154/59 138/67  Pulse: 79 94  Resp: 18 18  Temp: 98.5 F (36.9 C) 98.4 F (36.9 C)  SpO2: 96% 97%   Vitals:   05/07/21 2135 05/08/21 0638 05/08/21 2126 05/09/21 0622  BP: Marland Kitchen)  105/54  (!) 154/59 138/67  Pulse: 97 80 79 94  Resp: 19 19 18 18   Temp: 99.2 F (37.3 C) 99.1 F (37.3 C) 98.5 F (36.9 C) 98.4 F (36.9 C)  TempSrc: Oral Oral Oral   SpO2: 97% 97% 96% 97%  Weight:      Height:        General: Pt is alert, awake, not in acute distress Cardiovascular: RRR, S1/S2 +, no rubs, no gallops Respiratory: CTA bilaterally, no wheezing, no rhonchi Abdominal:  Soft, NT, ND, bowel sounds + Extremities: no edema, no cyanosis   The results of significant diagnostics from this hospitalization (including imaging, microbiology, ancillary and laboratory) are listed below for reference.    Significant Diagnostic Studies: DG Chest Port 1 View  Result Date: 05/06/2021 CLINICAL DATA:  Questionable sepsis. EXAM: PORTABLE CHEST 1 VIEW COMPARISON:  11/26/2017 FINDINGS: The cardiomediastinal silhouette is unchanged with normal heart size. Aortic atherosclerosis is noted. The lungs are well inflated with mild chronic interstitial coarsening. No confluent airspace opacity, edema, sizable pleural effusion, or pneumothorax is identified. No acute osseous abnormality is seen. IMPRESSION: No active disease. Electronically Signed   By: 01/26/2018 M.D.   On: 05/06/2021 11:22   CT Renal Stone Study  Result Date: 05/06/2021 CLINICAL DATA:  Hematuria of unknown cause. Dysuria and incontinence. Patient fell 3-4 days ago. EXAM: CT ABDOMEN AND PELVIS WITHOUT CONTRAST TECHNIQUE: Multidetector CT imaging of the abdomen and pelvis was performed following the standard protocol without IV contrast. COMPARISON:  None. FINDINGS: Lower chest: Significant coronary artery calcifications. Heart size is normal. No pericardial effusion. Hepatobiliary: Calcified granulomata within the liver. Status post cholecystectomy. Pancreas: Unremarkable. No pancreatic ductal dilatation or surrounding inflammatory changes. Spleen: Normal in size without focal abnormality. Adrenals/Urinary Tract: Adrenal glands are normal. RIGHT kidney: LOWER pole cyst is 3.0 centimeters. There is no hydronephrosis. RIGHT ureter is unremarkable. LEFT kidney: 3 millimeter calculus identified in the midpole region. No hydronephrosis. No renal mass. LEFT ureter is unremarkable. Bladder is decompressed with thickened wall. There is stranding surrounding the urinary bladder, consistent with cystitis or bladder outlet obstruction.  Stomach/Bowel: Large hiatal hernia. Stomach and small bowel loops are otherwise normal in appearance. Numerous colonic diverticula without evidence for acute diverticulitis. The appendix is well seen and has a normal appearance. Vascular/Lymphatic: There is moderate atherosclerosis of the abdominal aorta and its branches. No adenopathy. Reproductive: Prostate is enlarged and partially calcified. Other: No ascites.  Abdominal wall is unremarkable. Musculoskeletal: Previous lumbar laminectomy. There has been fusion of L2-3. There is posterior listhesis of L3 on L4 by 9 millimeters. Marked LOWER lumbar degenerative changes. No lytic or blastic lesions. IMPRESSION: 1. Thickened urinary bladder wall and stranding surrounding the bladder, favoring cystitis. Similar appearance can be seen with bladder outlet obstruction. 2. Prostatic enlargement. 3. Significant coronary artery disease. 4. Cholecystectomy. 5. Nephrolithiasis. 6. Large hiatal hernia. 7.  Aortic atherosclerosis.  (ICD10-I70.0) 8. Prior lumbar fusion and laminectomy. Degenerative changes in the LOWER lumbar spine. Electronically Signed   By: 05/08/2021 M.D.   On: 05/06/2021 13:04    Microbiology: Recent Results (from the past 240 hour(s))  Blood Culture (routine x 2)     Status: None (Preliminary result)   Collection Time: 05/06/21 11:32 AM   Specimen: Right Antecubital; Blood  Result Value Ref Range Status   Specimen Description RIGHT ANTECUBITAL  Final   Special Requests   Final    BOTTLES DRAWN AEROBIC AND ANAEROBIC Blood Culture results may not be optimal  due to an inadequate volume of blood received in culture bottles   Culture   Final    NO GROWTH 3 DAYS Performed at [facility], [address], [place], [place] [address]    Report Status PENDING  Incomplete  Resp Panel by RT-PCR (Flu A&B, Covid) Nasopharyngeal Swab     Status: None   Collection Time: 05/06/21 11:50 AM   Specimen: Nasopharyngeal Swab; Nasopharyngeal(NP) swabs in  vial transport medium  Result Value Ref Range Status   SARS Coronavirus 2 by RT PCR NEGATIVE NEGATIVE Final    Comment: (NOTE) SARS-CoV-2 target nucleic acids are NOT DETECTED.  The SARS-CoV-2 RNA is generally detectable in upper respiratory specimens during the acute phase of infection. The lowest concentration of SARS-CoV-2 viral copies this assay can detect is 138 copies/mL. A negative result does not preclude SARS-Cov-2 infection and should not be used as the sole basis for treatment or other patient management decisions. A negative result may occur with  improper specimen collection/handling, submission of specimen other than nasopharyngeal swab, presence of viral mutation(s) within the areas targeted by this assay, and inadequate number of viral copies(<138 copies/mL). A negative result must be combined with clinical observations, patient history, and epidemiological information. The expected result is Negative.  Fact Sheet for Patients:  EntrepreneurPulse.com.au  Fact Sheet for Healthcare Providers:  IncredibleEmployment.be  This test is no t yet approved or cleared by the [place] FDA and  has been authorized for detection and/or diagnosis of SARS-CoV-2 by FDA under an Emergency Use Authorization (EUA). This EUA will remain  in effect (meaning this test can be used) for the duration of the COVID-19 declaration under Section 564(b)(1) of the Act, 21 U.S.C.section 360bbb-3(b)(1), unless the authorization is terminated  or revoked sooner.       Influenza A by PCR NEGATIVE NEGATIVE Final   Influenza B by PCR NEGATIVE NEGATIVE Final    Comment: (NOTE) The Xpert Xpress SARS-CoV-2/FLU/RSV plus assay is intended as an aid in the diagnosis of influenza from Nasopharyngeal swab specimens and should not be used as a sole basis for treatment. Nasal washings and aspirates are unacceptable for Xpert Xpress SARS-CoV-2/FLU/RSV testing.  Fact  Sheet for Patients: EntrepreneurPulse.com.au  Fact Sheet for Healthcare Providers: IncredibleEmployment.be  This test is not yet approved or cleared by the [place] FDA and has been authorized for detection and/or diagnosis of SARS-CoV-2 by FDA under an Emergency Use Authorization (EUA). This EUA will remain in effect (meaning this test can be used) for the duration of the COVID-19 declaration under Section 564(b)(1) of the Act, 21 U.S.C. section 360bbb-3(b)(1), unless the authorization is terminated or revoked.  Performed at [facility], [address], [place], [place] [address]   Blood Culture (routine x 2)     Status: Abnormal   Collection Time: 05/06/21 11:58 AM   Specimen: BLOOD RIGHT HAND  Result Value Ref Range Status   Specimen Description   Final    BLOOD RIGHT HAND Performed at [facility], [address] [place], [place] [address]    Special Requests   Final    BOTTLES DRAWN AEROBIC AND ANAEROBIC Blood Culture adequate volume Performed at [facility], [address] [place],  [address]    Culture  Setup Time   Final    GRAM NEGATIVE RODS Aerobic bottle Gram Stain Report Called to,Read Back By and Verified With: Parrish,[email or web address]  by Zigmund Daniel, b 11.15.22 CRITICAL RESULT CALLED TO, READ BACK BY AND VERIFIED WITH: [place] S HOLL [id number]  AT 926 AM BY CM Performed at [facility], [address] [place], [place] [address]    Culture ESCHERICHIA COLI (A)  Final   Report Status 05/09/2021 FINAL  Final   Organism ID, Bacteria ESCHERICHIA COLI  Final      Susceptibility   Escherichia coli - MIC*    AMPICILLIN <=2 SENSITIVE Sensitive     CEFAZOLIN <=4 SENSITIVE Sensitive     CEFEPIME <=0.12 SENSITIVE Sensitive     CEFTAZIDIME <=1 SENSITIVE Sensitive     CEFTRIAXONE <=0.25 SENSITIVE Sensitive     CIPROFLOXACIN <=0.25 SENSITIVE Sensitive     GENTAMICIN <=1 SENSITIVE Sensitive     IMIPENEM <=0.25 SENSITIVE Sensitive      TRIMETH/SULFA <=20 SENSITIVE Sensitive     AMPICILLIN/SULBACTAM <=2 SENSITIVE Sensitive     PIP/TAZO <=4 SENSITIVE Sensitive     * ESCHERICHIA COLI  Blood Culture ID Panel (Reflexed)     Status: Abnormal   Collection Time: 05/06/21 11:58 AM  Result Value Ref Range Status   Enterococcus faecalis NOT DETECTED NOT DETECTED Final   Enterococcus Faecium NOT DETECTED NOT DETECTED Final   Listeria monocytogenes NOT DETECTED NOT DETECTED Final   Staphylococcus species NOT DETECTED NOT DETECTED Final   Staphylococcus aureus (BCID) NOT DETECTED NOT DETECTED Final   Staphylococcus epidermidis NOT DETECTED NOT DETECTED Final   Staphylococcus lugdunensis NOT DETECTED NOT DETECTED Final   Streptococcus species NOT DETECTED NOT DETECTED Final   Streptococcus agalactiae NOT DETECTED NOT DETECTED Final   Streptococcus pneumoniae NOT DETECTED NOT DETECTED Final   Streptococcus pyogenes NOT DETECTED NOT DETECTED Final   A.calcoaceticus-baumannii NOT DETECTED NOT DETECTED Final   Bacteroides fragilis NOT DETECTED NOT DETECTED Final   Enterobacterales DETECTED (A) NOT DETECTED Final    Comment: Enterobacterales represent a large order of gram negative bacteria, not a single organism. CRITICAL RESULT CALLED TO, READ BACK BY AND VERIFIED WITH: PHARMD S HALL [id number] AT 927 AM BY CM    Enterobacter cloacae complex NOT DETECTED NOT DETECTED Final   Escherichia coli DETECTED (A) NOT DETECTED Final    Comment: CRITICAL RESULT CALLED TO, READ BACK BY AND VERIFIED WITH: PHARMD S HALL [id number] AT 926 M BY CM    Klebsiella aerogenes NOT DETECTED NOT DETECTED Final   Klebsiella oxytoca NOT DETECTED NOT DETECTED Final   Klebsiella pneumoniae NOT DETECTED NOT DETECTED Final   Proteus species NOT DETECTED NOT DETECTED Final   Salmonella species NOT DETECTED NOT DETECTED Final   Serratia marcescens NOT DETECTED NOT DETECTED Final   Haemophilus influenzae NOT DETECTED NOT DETECTED Final   Neisseria meningitidis NOT  DETECTED NOT DETECTED Final   Pseudomonas aeruginosa NOT DETECTED NOT DETECTED Final   Stenotrophomonas maltophilia NOT DETECTED NOT DETECTED Final   Candida albicans NOT DETECTED NOT DETECTED Final   Candida auris NOT DETECTED NOT DETECTED Final   Candida glabrata NOT DETECTED NOT DETECTED Final   Candida krusei NOT DETECTED NOT DETECTED Final   Candida parapsilosis NOT DETECTED NOT DETECTED Final   Candida tropicalis NOT DETECTED NOT DETECTED Final   Cryptococcus neoformans/gattii NOT DETECTED NOT DETECTED Final   CTX-M ESBL NOT DETECTED NOT DETECTED Final   Carbapenem resistance IMP NOT DETECTED NOT DETECTED Final   Carbapenem resistance KPC NOT DETECTED NOT DETECTED Final   Carbapenem resistance NDM NOT DETECTED NOT DETECTED Final   Carbapenem resist OXA 48 LIKE NOT DETECTED NOT DETECTED Final   Carbapenem resistance VIM NOT DETECTED NOT DETECTED Final    Comment:  Performed at [facility], [place] [address], [place], [place] [address]  Urine Culture     Status: Abnormal   Collection Time: 05/06/21  1:13 PM   Specimen: Urine, Catheterized  Result Value Ref Range Status   Specimen Description   Final    URINE, CATHETERIZED Performed at [facility], [address] [place], [place] [address]    Special Requests   Final    NONE Performed at [facility], [address], [place], [place] [address]    Culture >=100,000 COLONIES/mL ESCHERICHIA COLI (A)  Final   Report Status 05/08/2021 FINAL  Final   Organism ID, Bacteria ESCHERICHIA COLI (A)  Final      Susceptibility   Escherichia coli - MIC*    AMPICILLIN <=2 SENSITIVE Sensitive     CEFAZOLIN <=4 SENSITIVE Sensitive     CEFEPIME <=0.12 SENSITIVE Sensitive     CEFTRIAXONE <=0.25 SENSITIVE Sensitive     CIPROFLOXACIN 0.5 INTERMEDIATE Intermediate     GENTAMICIN <=1 SENSITIVE Sensitive     IMIPENEM <=0.25 SENSITIVE Sensitive     NITROFURANTOIN <=16 SENSITIVE Sensitive     TRIMETH/SULFA <=20 SENSITIVE Sensitive      AMPICILLIN/SULBACTAM <=2 SENSITIVE Sensitive     PIP/TAZO <=4 SENSITIVE Sensitive     * >=100,000 COLONIES/mL ESCHERICHIA COLI     Labs: Basic Metabolic Panel: Recent Labs  Lab 05/06/21 1132 05/07/21 0510 05/08/21 0453 05/09/21 0620  NA 135 134* 135 137  K 3.9 3.7 3.5 3.7  CL 97* 101 102 103  CO2 26 26 27 28   GLUCOSE 117* 91 89 98  BUN 15 21 16 12   CREATININE 1.06 1.11 0.93 0.84  CALCIUM 9.0 8.1* 8.2* 8.8*  MG  --  1.6*  --  2.0   Liver Function Tests: Recent Labs  Lab 05/06/21 1132 05/07/21 0510 05/08/21 0453  AST 27  --  35  ALT 14  --  15  ALKPHOS 62  --  58  BILITOT 2.1* 1.3* 0.5  PROT 7.5  --  5.6*  ALBUMIN 4.2  --  2.8*   No results for input(s): LIPASE, AMYLASE in the last 168 hours. No results for input(s): AMMONIA in the last 168 hours. CBC: Recent Labs  Lab 05/06/21 1132 05/07/21 0510 05/08/21 0453 05/09/21 0620  WBC 17.0* 14.2* 11.2* 9.7  NEUTROABS 14.6* 11.9* 8.8*  --   HGB 13.3 10.3* 10.5* 11.3*  HCT 40.8 32.5* 31.4* 34.7*  MCV 97.1 99.1 97.8 98.3  PLT 179 118* 138* 176   Cardiac Enzymes: No results for input(s): CKTOTAL, CKMB, CKMBINDEX, TROPONINI in the last 168 hours. BNP: Invalid input(s): POCBNP CBG: No results for input(s): GLUCAP in the last 168 hours.  Time coordinating discharge:  36 minutes  Signed:  Orson Eva, DO [facility] Pager: [phone] 05/09/2021, 11:18 AM

## 2021-05-10 ENCOUNTER — Other Ambulatory Visit: Payer: Self-pay

## 2021-05-10 DIAGNOSIS — I779 Disorder of arteries and arterioles, unspecified: Secondary | ICD-10-CM

## 2021-05-11 LAB — CULTURE, BLOOD (ROUTINE X 2): Culture: NO GROWTH

## 2021-05-13 ENCOUNTER — Telehealth: Payer: Self-pay

## 2021-05-13 NOTE — Telephone Encounter (Signed)
Transition Care Management Follow-up Telephone Call Date of discharge and from where: [facility] 05/09/21 - Sepsis due to UTI How have you been since you were released from the hospital? Still weak and tired, trouble getting around Any questions or concerns? No  Items Reviewed: Did the pt receive and understand the discharge instructions provided? Yes  Medications obtained and verified? Yes  Other? No  Any new allergies since your discharge? No  Dietary orders reviewed? Yes Do you have support at home? Yes   Home Care and Equipment/Supplies: Were home health services ordered? D/C papers says HHPT was ordered, but patient says it wasn't If so, what is the name of the agency? [facility]  Has the agency set up a time to come to the patient's home? no Were any new equipment or medical supplies ordered?  No What is the name of the medical supply agency? N/a Were you able to get the supplies/equipment? not applicable Do you have any questions related to the use of the equipment or supplies? No  Functional Questionnaire: (I = Independent and D = Dependent) ADLs: I - just needs some assistance with most everything right now, until he gets strength back  Bathing/Dressing- I  Meal Prep- D  Eating- I  Maintaining continence- I  Transferring/Ambulation- I  Managing Meds- I  Follow up appointments reviewed:  PCP Hospital f/u appt confirmed? Yes  Scheduled to see Deliah Boston on 05/23/21 @ 2. Specialist Hospital f/u appt confirmed? No   Are transportation arrangements needed? No  If their condition worsens, is the pt aware to call PCP or go to the Emergency Dept.? Yes Was the patient provided with contact information for the PCP's office or ED? Yes Was to pt encouraged to call back with questions or concerns? Yes

## 2021-05-15 ENCOUNTER — Telehealth: Payer: Self-pay

## 2021-05-15 NOTE — Chronic Care Management (AMB) (Signed)
  Chronic Care Management   Note  05/15/2021 Name: YVAN DORITY MRN: [id number] DOB: 1926/06/17  Jesse Fall is a 87 y.o. year old male who is a primary care patient of Loman Brooklyn, FNP. I reached out to [employer] by phone today in response to a referral sent by Mr. Caryn Section Iseminger's PCP.  Mr. Persons was given information about Chronic Care Management services today including:  CCM service includes personalized support from designated clinical staff supervised by his physician, including individualized plan of care and coordination with other care providers 24/7 contact phone numbers for assistance for urgent and routine care needs. Service will only be billed when office clinical staff spend 20 minutes or more in a month to coordinate care. Only one practitioner may furnish and bill the service in a calendar month. The patient may stop CCM services at any time (effective at the end of the month) by phone call to the office staff. The patient is responsible for co-pay (up to 20% after annual deductible is met) if co-pay is required by the individual health plan.   Patient did not agree to enrollment in care management services and does not wish to consider at this time.  Follow up plan: Patient declines further follow up and engagement by the care management team. Appropriate care team members and provider have been notified via electronic communication.   Noreene Larsson, [place], [place], [place] [address] Direct Dial: [phone] [email or web address]: [email or web address]

## 2021-05-20 DIAGNOSIS — R6520 Severe sepsis without septic shock: Secondary | ICD-10-CM | POA: Diagnosis not present

## 2021-05-20 DIAGNOSIS — I10 Essential (primary) hypertension: Secondary | ICD-10-CM | POA: Diagnosis not present

## 2021-05-20 DIAGNOSIS — N200 Calculus of kidney: Secondary | ICD-10-CM | POA: Diagnosis not present

## 2021-05-20 DIAGNOSIS — M5010 Cervical disc disorder with radiculopathy, unspecified cervical region: Secondary | ICD-10-CM | POA: Diagnosis not present

## 2021-05-20 DIAGNOSIS — I6521 Occlusion and stenosis of right carotid artery: Secondary | ICD-10-CM | POA: Diagnosis not present

## 2021-05-20 DIAGNOSIS — E785 Hyperlipidemia, unspecified: Secondary | ICD-10-CM | POA: Diagnosis not present

## 2021-05-20 DIAGNOSIS — R7303 Prediabetes: Secondary | ICD-10-CM | POA: Diagnosis not present

## 2021-05-20 DIAGNOSIS — Z9181 History of falling: Secondary | ICD-10-CM | POA: Diagnosis not present

## 2021-05-20 DIAGNOSIS — N3090 Cystitis, unspecified without hematuria: Secondary | ICD-10-CM | POA: Diagnosis not present

## 2021-05-20 DIAGNOSIS — I351 Nonrheumatic aortic (valve) insufficiency: Secondary | ICD-10-CM | POA: Diagnosis not present

## 2021-05-20 DIAGNOSIS — Z8673 Personal history of transient ischemic attack (TIA), and cerebral infarction without residual deficits: Secondary | ICD-10-CM | POA: Diagnosis not present

## 2021-05-20 DIAGNOSIS — E860 Dehydration: Secondary | ICD-10-CM | POA: Diagnosis not present

## 2021-05-20 DIAGNOSIS — R001 Bradycardia, unspecified: Secondary | ICD-10-CM | POA: Diagnosis not present

## 2021-05-20 DIAGNOSIS — Z7902 Long term (current) use of antithrombotics/antiplatelets: Secondary | ICD-10-CM | POA: Diagnosis not present

## 2021-05-23 ENCOUNTER — Ambulatory Visit (INDEPENDENT_AMBULATORY_CARE_PROVIDER_SITE_OTHER): Payer: Medicare Other | Admitting: Family Medicine

## 2021-05-23 ENCOUNTER — Encounter: Payer: Self-pay | Admitting: Family Medicine

## 2021-05-23 VITALS — BP 166/81 | HR 110 | Temp 97.8°F | Ht 65.0 in | Wt 163.0 lb

## 2021-05-23 DIAGNOSIS — Z87440 Personal history of urinary (tract) infections: Secondary | ICD-10-CM | POA: Diagnosis not present

## 2021-05-23 DIAGNOSIS — Z09 Encounter for follow-up examination after completed treatment for conditions other than malignant neoplasm: Secondary | ICD-10-CM | POA: Diagnosis not present

## 2021-05-23 DIAGNOSIS — M150 Primary generalized (osteo)arthritis: Secondary | ICD-10-CM

## 2021-05-23 DIAGNOSIS — M159 Polyosteoarthritis, unspecified: Secondary | ICD-10-CM | POA: Diagnosis not present

## 2021-05-23 DIAGNOSIS — Z8619 Personal history of other infectious and parasitic diseases: Secondary | ICD-10-CM | POA: Diagnosis not present

## 2021-05-23 MED ORDER — TRAMADOL HCL 50 MG PO TABS: 50.0000 mg | ORAL_TABLET | Freq: Two times a day (BID) | ORAL | 0 refills | Status: AC | PRN

## 2021-05-23 NOTE — Progress Notes (Signed)
Assessment & Plan:  1-3. Recent urinary tract infection/History of severe sepsis/Hospital discharge follow-up - UTI improved - continue home PT to help regain strength - encouraged to drink fluids and stay hydrated to prevent recurrence - CBC with Differential/Platelet - CMP14+EGFR  4. Primary osteoarthritis involving multiple joints - continue to use tylenol as needed for pain, adding short course of tramadol as needed - traMADol (ULTRAM) 50 MG tablet; Take 1 tablet (50 mg total) by mouth every 12 (twelve) hours as needed for up to 5 days.  Dispense: 10 tablet; Refill: 0   Follow up plan: Return for pain management if desires to continue Tramadol long term.  Lucile Crater, NP Student  I personally was present during the history, physical exam, and medical decision-making activities of this service and have verified that the service and findings are accurately documented in the nurse practitioner student's note.  Hendricks Limes, MSN, APRN, FNP-C [facility]   Subjective:   Patient ID: Paul Bradshaw, male    DOB: [birth year], 87 y.o.   MRN: [id number]  HPI: Paul Bradshaw is a 87 y.o. male presenting on 05/23/2021 for Transitions Of Care (AP 05/09/21 - sepsis due to UTI)  He was recently hospitalized at [facility] from 11/14-11/17 for urosepsis. He was given IV fluids and ceftriaxone, blood cultures were positive for E. Coli. He was discharged with home health for physical therapy. He states he feels better from a urinary perspective but has residual joint pain that is not being managed well by tylenol. He states he had this pain prior to being hospitalized that he tried to treat with aspirin and began having blood in his urine, so he has not been taking it. He is concerned about ongoing weakness but is optimistic that PT will help.    ROS: Negative unless specifically indicated above in HPI.   Relevant past medical history reviewed and updated as  indicated.   Allergies and medications reviewed and updated.   Current Outpatient Medications:    acetaminophen (TYLENOL) 500 MG tablet, Take 1,000 mg by mouth every 8 (eight) hours as needed for moderate pain., Disp: , Rfl:    atorvastatin (LIPITOR) 10 MG tablet, TAKE ONE-HALF TABLET BY  MOUTH DAILY AT 6 PM (Patient taking differently: Take 10 mg by mouth daily.), Disp: 45 tablet, Rfl: 1   cetirizine (ZYRTEC) 10 MG tablet, Take 10 mg by mouth daily., Disp: , Rfl:    ciprofloxacin (CIPRO) 500 MG tablet, Take 1 tablet (500 mg total) by mouth 2 (two) times daily., Disp: 12 tablet, Rfl: 0   clopidogrel (PLAVIX) 75 MG tablet, TAKE 1 TABLET BY MOUTH  DAILY WITH BREAKFAST, Disp: 90 tablet, Rfl: 1   Multiple Vitamins-Minerals (MULTIVITAMIN PO), Take 1 tablet by mouth daily., Disp: , Rfl:    tamsulosin (FLOMAX) 0.4 MG CAPS capsule, TAKE 1 CAPSULE BY MOUTH IN  THE EVENING (Patient taking differently: Take 0.4 mg by mouth daily.), Disp: 90 capsule, Rfl: 1   Turmeric 1053 MG TABS, Take by mouth., Disp: , Rfl:   Allergies  Allergen Reactions   Amlodipine Swelling    Swelling of lips.   Lisinopril Other (See Comments)    Elevated BP higher & causes a burning feeling on the inside.     Objective:   BP (!) 166/81   Pulse (!) 110   Temp 97.8 F (36.6 C) (Temporal)   Ht $R'5\' 5"'ll$  (1.651 m)   Wt 163 lb (73.9 kg)   SpO2  96%   BMI 27.12 kg/m    Physical Exam Vitals reviewed.  Constitutional:      General: He is not in acute distress.    Appearance: Normal appearance. He is not ill-appearing, toxic-appearing or diaphoretic.  HENT:     Head: Normocephalic and atraumatic.  Eyes:     General: No scleral icterus.       Right eye: No discharge.        Left eye: No discharge.     Conjunctiva/sclera: Conjunctivae normal.  Cardiovascular:     Rate and Rhythm: Normal rate and regular rhythm.     Heart sounds: Normal heart sounds. No murmur heard.   No friction rub. No gallop.  Pulmonary:      Effort: Pulmonary effort is normal. No respiratory distress.     Breath sounds: Normal breath sounds. No stridor. No wheezing, rhonchi or rales.  Musculoskeletal:        General: Tenderness (generalized back and joints) present. Normal range of motion.     Cervical back: Normal range of motion.     Right lower leg: No edema.     Left lower leg: No edema.  Skin:    General: Skin is warm and dry.  Neurological:     Mental Status: He is alert and oriented to person, place, and time. Mental status is at baseline.  Psychiatric:        Mood and Affect: Mood normal.        Behavior: Behavior normal.        Thought Content: Thought content normal.        Judgment: Judgment normal.

## 2021-05-24 LAB — CBC WITH DIFFERENTIAL/PLATELET
Basophils Absolute: 0.1 10*3/uL (ref 0.0–0.2)
Basos: 1 %
EOS (ABSOLUTE): 0 10*3/uL (ref 0.0–0.4)
Eos: 1 %
Hematocrit: 37.4 % — ABNORMAL LOW (ref 37.5–51.0)
Hemoglobin: 12.8 g/dL — ABNORMAL LOW (ref 13.0–17.7)
Immature Grans (Abs): 0 10*3/uL (ref 0.0–0.1)
Immature Granulocytes: 0 %
Lymphocytes Absolute: 1.5 10*3/uL (ref 0.7–3.1)
Lymphs: 18 %
MCH: 31.4 pg (ref 26.6–33.0)
MCHC: 34.2 g/dL (ref 31.5–35.7)
MCV: 92 fL (ref 79–97)
Monocytes Absolute: 0.6 10*3/uL (ref 0.1–0.9)
Monocytes: 7 %
Neutrophils Absolute: 6 10*3/uL (ref 1.4–7.0)
Neutrophils: 73 %
Platelets: 409 10*3/uL (ref 150–450)
RBC: 4.08 x10E6/uL — ABNORMAL LOW (ref 4.14–5.80)
RDW: 12.2 % (ref 11.6–15.4)
WBC: 8.1 10*3/uL (ref 3.4–10.8)

## 2021-05-24 LAB — CMP14+EGFR
ALT: 13 IU/L (ref 0–44)
AST: 31 IU/L (ref 0–40)
Albumin/Globulin Ratio: 1.9 (ref 1.2–2.2)
Albumin: 4.4 g/dL (ref 3.5–4.6)
Alkaline Phosphatase: 69 IU/L (ref 44–121)
BUN/Creatinine Ratio: 15 (ref 10–24)
BUN: 17 mg/dL (ref 10–36)
Bilirubin Total: 0.4 mg/dL (ref 0.0–1.2)
CO2: 24 mmol/L (ref 20–29)
Calcium: 9.3 mg/dL (ref 8.6–10.2)
Chloride: 102 mmol/L (ref 96–106)
Creatinine, Ser: 1.14 mg/dL (ref 0.76–1.27)
Globulin, Total: 2.3 g/dL (ref 1.5–4.5)
Glucose: 102 mg/dL — ABNORMAL HIGH (ref 70–99)
Potassium: 4.3 mmol/L (ref 3.5–5.2)
Sodium: 142 mmol/L (ref 134–144)
Total Protein: 6.7 g/dL (ref 6.0–8.5)
eGFR: 60 mL/min/{1.73_m2} (ref >59)

## 2021-05-28 DIAGNOSIS — N3090 Cystitis, unspecified without hematuria: Secondary | ICD-10-CM | POA: Diagnosis not present

## 2021-05-28 DIAGNOSIS — R001 Bradycardia, unspecified: Secondary | ICD-10-CM | POA: Diagnosis not present

## 2021-05-28 DIAGNOSIS — R6520 Severe sepsis without septic shock: Secondary | ICD-10-CM | POA: Diagnosis not present

## 2021-05-28 DIAGNOSIS — I10 Essential (primary) hypertension: Secondary | ICD-10-CM | POA: Diagnosis not present

## 2021-05-28 DIAGNOSIS — Z9181 History of falling: Secondary | ICD-10-CM | POA: Diagnosis not present

## 2021-05-28 DIAGNOSIS — M5010 Cervical disc disorder with radiculopathy, unspecified cervical region: Secondary | ICD-10-CM | POA: Diagnosis not present

## 2021-05-28 DIAGNOSIS — E785 Hyperlipidemia, unspecified: Secondary | ICD-10-CM | POA: Diagnosis not present

## 2021-05-28 DIAGNOSIS — Z8673 Personal history of transient ischemic attack (TIA), and cerebral infarction without residual deficits: Secondary | ICD-10-CM | POA: Diagnosis not present

## 2021-05-28 DIAGNOSIS — Z7902 Long term (current) use of antithrombotics/antiplatelets: Secondary | ICD-10-CM | POA: Diagnosis not present

## 2021-05-28 DIAGNOSIS — I6521 Occlusion and stenosis of right carotid artery: Secondary | ICD-10-CM | POA: Diagnosis not present

## 2021-05-28 DIAGNOSIS — N200 Calculus of kidney: Secondary | ICD-10-CM | POA: Diagnosis not present

## 2021-05-28 DIAGNOSIS — I351 Nonrheumatic aortic (valve) insufficiency: Secondary | ICD-10-CM | POA: Diagnosis not present

## 2021-05-28 DIAGNOSIS — E860 Dehydration: Secondary | ICD-10-CM | POA: Diagnosis not present

## 2021-05-28 DIAGNOSIS — R7303 Prediabetes: Secondary | ICD-10-CM | POA: Diagnosis not present

## 2021-05-31 DIAGNOSIS — E785 Hyperlipidemia, unspecified: Secondary | ICD-10-CM | POA: Diagnosis not present

## 2021-05-31 DIAGNOSIS — M5010 Cervical disc disorder with radiculopathy, unspecified cervical region: Secondary | ICD-10-CM | POA: Diagnosis not present

## 2021-05-31 DIAGNOSIS — I6521 Occlusion and stenosis of right carotid artery: Secondary | ICD-10-CM | POA: Diagnosis not present

## 2021-05-31 DIAGNOSIS — N200 Calculus of kidney: Secondary | ICD-10-CM | POA: Diagnosis not present

## 2021-05-31 DIAGNOSIS — R6520 Severe sepsis without septic shock: Secondary | ICD-10-CM | POA: Diagnosis not present

## 2021-05-31 DIAGNOSIS — Z8673 Personal history of transient ischemic attack (TIA), and cerebral infarction without residual deficits: Secondary | ICD-10-CM | POA: Diagnosis not present

## 2021-05-31 DIAGNOSIS — Z7902 Long term (current) use of antithrombotics/antiplatelets: Secondary | ICD-10-CM | POA: Diagnosis not present

## 2021-05-31 DIAGNOSIS — R001 Bradycardia, unspecified: Secondary | ICD-10-CM | POA: Diagnosis not present

## 2021-05-31 DIAGNOSIS — R7303 Prediabetes: Secondary | ICD-10-CM | POA: Diagnosis not present

## 2021-05-31 DIAGNOSIS — I351 Nonrheumatic aortic (valve) insufficiency: Secondary | ICD-10-CM | POA: Diagnosis not present

## 2021-05-31 DIAGNOSIS — E860 Dehydration: Secondary | ICD-10-CM | POA: Diagnosis not present

## 2021-05-31 DIAGNOSIS — I10 Essential (primary) hypertension: Secondary | ICD-10-CM | POA: Diagnosis not present

## 2021-05-31 DIAGNOSIS — N3090 Cystitis, unspecified without hematuria: Secondary | ICD-10-CM | POA: Diagnosis not present

## 2021-05-31 DIAGNOSIS — Z9181 History of falling: Secondary | ICD-10-CM | POA: Diagnosis not present

## 2021-06-04 ENCOUNTER — Other Ambulatory Visit: Payer: Self-pay

## 2021-06-04 ENCOUNTER — Ambulatory Visit (INDEPENDENT_AMBULATORY_CARE_PROVIDER_SITE_OTHER): Payer: Medicare Other

## 2021-06-04 DIAGNOSIS — I6521 Occlusion and stenosis of right carotid artery: Secondary | ICD-10-CM

## 2021-06-04 DIAGNOSIS — M5010 Cervical disc disorder with radiculopathy, unspecified cervical region: Secondary | ICD-10-CM

## 2021-06-04 DIAGNOSIS — R7303 Prediabetes: Secondary | ICD-10-CM | POA: Diagnosis not present

## 2021-06-04 DIAGNOSIS — A4151 Sepsis due to Escherichia coli [E. coli]: Secondary | ICD-10-CM | POA: Diagnosis not present

## 2021-06-04 DIAGNOSIS — R6520 Severe sepsis without septic shock: Secondary | ICD-10-CM | POA: Diagnosis not present

## 2021-06-04 DIAGNOSIS — Z9181 History of falling: Secondary | ICD-10-CM | POA: Diagnosis not present

## 2021-06-04 DIAGNOSIS — E785 Hyperlipidemia, unspecified: Secondary | ICD-10-CM | POA: Diagnosis not present

## 2021-06-04 DIAGNOSIS — N3090 Cystitis, unspecified without hematuria: Secondary | ICD-10-CM | POA: Diagnosis not present

## 2021-06-04 DIAGNOSIS — N400 Enlarged prostate without lower urinary tract symptoms: Secondary | ICD-10-CM

## 2021-06-04 DIAGNOSIS — Z8673 Personal history of transient ischemic attack (TIA), and cerebral infarction without residual deficits: Secondary | ICD-10-CM | POA: Diagnosis not present

## 2021-06-04 DIAGNOSIS — I10 Essential (primary) hypertension: Secondary | ICD-10-CM | POA: Diagnosis not present

## 2021-06-04 DIAGNOSIS — R001 Bradycardia, unspecified: Secondary | ICD-10-CM | POA: Diagnosis not present

## 2021-06-04 DIAGNOSIS — E860 Dehydration: Secondary | ICD-10-CM

## 2021-06-04 DIAGNOSIS — N200 Calculus of kidney: Secondary | ICD-10-CM

## 2021-06-04 DIAGNOSIS — Z7902 Long term (current) use of antithrombotics/antiplatelets: Secondary | ICD-10-CM | POA: Diagnosis not present

## 2021-06-04 DIAGNOSIS — I351 Nonrheumatic aortic (valve) insufficiency: Secondary | ICD-10-CM

## 2021-06-06 DIAGNOSIS — Z7902 Long term (current) use of antithrombotics/antiplatelets: Secondary | ICD-10-CM | POA: Diagnosis not present

## 2021-06-06 DIAGNOSIS — N200 Calculus of kidney: Secondary | ICD-10-CM | POA: Diagnosis not present

## 2021-06-06 DIAGNOSIS — R7303 Prediabetes: Secondary | ICD-10-CM | POA: Diagnosis not present

## 2021-06-06 DIAGNOSIS — E785 Hyperlipidemia, unspecified: Secondary | ICD-10-CM | POA: Diagnosis not present

## 2021-06-06 DIAGNOSIS — Z8673 Personal history of transient ischemic attack (TIA), and cerebral infarction without residual deficits: Secondary | ICD-10-CM | POA: Diagnosis not present

## 2021-06-06 DIAGNOSIS — I6521 Occlusion and stenosis of right carotid artery: Secondary | ICD-10-CM | POA: Diagnosis not present

## 2021-06-06 DIAGNOSIS — E860 Dehydration: Secondary | ICD-10-CM | POA: Diagnosis not present

## 2021-06-06 DIAGNOSIS — Z9181 History of falling: Secondary | ICD-10-CM | POA: Diagnosis not present

## 2021-06-06 DIAGNOSIS — M5010 Cervical disc disorder with radiculopathy, unspecified cervical region: Secondary | ICD-10-CM | POA: Diagnosis not present

## 2021-06-06 DIAGNOSIS — R6520 Severe sepsis without septic shock: Secondary | ICD-10-CM | POA: Diagnosis not present

## 2021-06-06 DIAGNOSIS — N3090 Cystitis, unspecified without hematuria: Secondary | ICD-10-CM | POA: Diagnosis not present

## 2021-06-06 DIAGNOSIS — I351 Nonrheumatic aortic (valve) insufficiency: Secondary | ICD-10-CM | POA: Diagnosis not present

## 2021-06-06 DIAGNOSIS — R001 Bradycardia, unspecified: Secondary | ICD-10-CM | POA: Diagnosis not present

## 2021-06-06 DIAGNOSIS — I10 Essential (primary) hypertension: Secondary | ICD-10-CM | POA: Diagnosis not present

## 2021-06-11 DIAGNOSIS — E860 Dehydration: Secondary | ICD-10-CM | POA: Diagnosis not present

## 2021-06-11 DIAGNOSIS — N200 Calculus of kidney: Secondary | ICD-10-CM | POA: Diagnosis not present

## 2021-06-11 DIAGNOSIS — Z8673 Personal history of transient ischemic attack (TIA), and cerebral infarction without residual deficits: Secondary | ICD-10-CM | POA: Diagnosis not present

## 2021-06-11 DIAGNOSIS — I351 Nonrheumatic aortic (valve) insufficiency: Secondary | ICD-10-CM | POA: Diagnosis not present

## 2021-06-11 DIAGNOSIS — R6520 Severe sepsis without septic shock: Secondary | ICD-10-CM | POA: Diagnosis not present

## 2021-06-11 DIAGNOSIS — R7303 Prediabetes: Secondary | ICD-10-CM | POA: Diagnosis not present

## 2021-06-11 DIAGNOSIS — E785 Hyperlipidemia, unspecified: Secondary | ICD-10-CM | POA: Diagnosis not present

## 2021-06-11 DIAGNOSIS — R001 Bradycardia, unspecified: Secondary | ICD-10-CM | POA: Diagnosis not present

## 2021-06-11 DIAGNOSIS — Z7902 Long term (current) use of antithrombotics/antiplatelets: Secondary | ICD-10-CM | POA: Diagnosis not present

## 2021-06-11 DIAGNOSIS — Z9181 History of falling: Secondary | ICD-10-CM | POA: Diagnosis not present

## 2021-06-11 DIAGNOSIS — I6521 Occlusion and stenosis of right carotid artery: Secondary | ICD-10-CM | POA: Diagnosis not present

## 2021-06-11 DIAGNOSIS — I10 Essential (primary) hypertension: Secondary | ICD-10-CM | POA: Diagnosis not present

## 2021-06-11 DIAGNOSIS — N3090 Cystitis, unspecified without hematuria: Secondary | ICD-10-CM | POA: Diagnosis not present

## 2021-06-11 DIAGNOSIS — M5010 Cervical disc disorder with radiculopathy, unspecified cervical region: Secondary | ICD-10-CM | POA: Diagnosis not present

## 2021-06-13 DIAGNOSIS — M5010 Cervical disc disorder with radiculopathy, unspecified cervical region: Secondary | ICD-10-CM | POA: Diagnosis not present

## 2021-06-13 DIAGNOSIS — R6520 Severe sepsis without septic shock: Secondary | ICD-10-CM | POA: Diagnosis not present

## 2021-06-13 DIAGNOSIS — R7303 Prediabetes: Secondary | ICD-10-CM | POA: Diagnosis not present

## 2021-06-13 DIAGNOSIS — E860 Dehydration: Secondary | ICD-10-CM | POA: Diagnosis not present

## 2021-06-13 DIAGNOSIS — N200 Calculus of kidney: Secondary | ICD-10-CM | POA: Diagnosis not present

## 2021-06-13 DIAGNOSIS — E785 Hyperlipidemia, unspecified: Secondary | ICD-10-CM | POA: Diagnosis not present

## 2021-06-13 DIAGNOSIS — I6521 Occlusion and stenosis of right carotid artery: Secondary | ICD-10-CM | POA: Diagnosis not present

## 2021-06-13 DIAGNOSIS — Z8673 Personal history of transient ischemic attack (TIA), and cerebral infarction without residual deficits: Secondary | ICD-10-CM | POA: Diagnosis not present

## 2021-06-13 DIAGNOSIS — I351 Nonrheumatic aortic (valve) insufficiency: Secondary | ICD-10-CM | POA: Diagnosis not present

## 2021-06-13 DIAGNOSIS — Z9181 History of falling: Secondary | ICD-10-CM | POA: Diagnosis not present

## 2021-06-13 DIAGNOSIS — R001 Bradycardia, unspecified: Secondary | ICD-10-CM | POA: Diagnosis not present

## 2021-06-13 DIAGNOSIS — N3090 Cystitis, unspecified without hematuria: Secondary | ICD-10-CM | POA: Diagnosis not present

## 2021-06-13 DIAGNOSIS — I10 Essential (primary) hypertension: Secondary | ICD-10-CM | POA: Diagnosis not present

## 2021-06-13 DIAGNOSIS — Z7902 Long term (current) use of antithrombotics/antiplatelets: Secondary | ICD-10-CM | POA: Diagnosis not present

## 2021-06-29 ENCOUNTER — Emergency Department (HOSPITAL_COMMUNITY): Payer: Medicare Other

## 2021-06-29 ENCOUNTER — Other Ambulatory Visit: Payer: Self-pay

## 2021-06-29 ENCOUNTER — Observation Stay (HOSPITAL_COMMUNITY)
Admission: EM | Admit: 2021-06-29 | Discharge: 2021-07-02 | Disposition: A | Discharge status: Home / Self Care | Payer: Medicare Other | Attending: Internal Medicine | Admitting: Internal Medicine

## 2021-06-29 ENCOUNTER — Encounter (HOSPITAL_COMMUNITY): Payer: Self-pay

## 2021-06-29 DIAGNOSIS — R531 Weakness: Principal | ICD-10-CM

## 2021-06-29 DIAGNOSIS — I10 Essential (primary) hypertension: Secondary | ICD-10-CM | POA: Insufficient documentation

## 2021-06-29 DIAGNOSIS — Z20822 Contact with and (suspected) exposure to covid-19: Secondary | ICD-10-CM | POA: Diagnosis not present

## 2021-06-29 DIAGNOSIS — Z743 Need for continuous supervision: Secondary | ICD-10-CM | POA: Diagnosis not present

## 2021-06-29 DIAGNOSIS — R7303 Prediabetes: Secondary | ICD-10-CM | POA: Diagnosis not present

## 2021-06-29 DIAGNOSIS — I2510 Atherosclerotic heart disease of native coronary artery without angina pectoris: Secondary | ICD-10-CM | POA: Insufficient documentation

## 2021-06-29 DIAGNOSIS — R509 Fever, unspecified: Secondary | ICD-10-CM | POA: Diagnosis not present

## 2021-06-29 DIAGNOSIS — N3001 Acute cystitis with hematuria: Secondary | ICD-10-CM | POA: Diagnosis not present

## 2021-06-29 DIAGNOSIS — K449 Diaphragmatic hernia without obstruction or gangrene: Secondary | ICD-10-CM | POA: Diagnosis not present

## 2021-06-29 DIAGNOSIS — R2689 Other abnormalities of gait and mobility: Secondary | ICD-10-CM | POA: Insufficient documentation

## 2021-06-29 DIAGNOSIS — N3090 Cystitis, unspecified without hematuria: Secondary | ICD-10-CM

## 2021-06-29 DIAGNOSIS — R6889 Other general symptoms and signs: Secondary | ICD-10-CM | POA: Diagnosis not present

## 2021-06-29 DIAGNOSIS — Z79899 Other long term (current) drug therapy: Secondary | ICD-10-CM | POA: Diagnosis not present

## 2021-06-29 DIAGNOSIS — N400 Enlarged prostate without lower urinary tract symptoms: Secondary | ICD-10-CM | POA: Diagnosis present

## 2021-06-29 DIAGNOSIS — R52 Pain, unspecified: Secondary | ICD-10-CM | POA: Diagnosis not present

## 2021-06-29 DIAGNOSIS — Z8673 Personal history of transient ischemic attack (TIA), and cerebral infarction without residual deficits: Secondary | ICD-10-CM

## 2021-06-29 LAB — URINALYSIS, ROUTINE W REFLEX MICROSCOPIC
Bilirubin Urine: NEGATIVE
Glucose, UA: NEGATIVE mg/dL
Ketones, ur: 20 mg/dL — AB
Nitrite: NEGATIVE
Protein, ur: 30 mg/dL — AB
Specific Gravity, Urine: 1.014 (ref 1.005–1.030)
pH: 7 (ref 5.0–8.0)

## 2021-06-29 LAB — COMPREHENSIVE METABOLIC PANEL
ALT: 14 U/L (ref 0–44)
AST: 27 U/L (ref 15–41)
Albumin: 4 g/dL (ref 3.5–5.0)
Alkaline Phosphatase: 59 U/L (ref 38–126)
Anion gap: 10 (ref 5–15)
BUN: 11 mg/dL (ref 8–23)
CO2: 28 mmol/L (ref 22–32)
Calcium: 8.8 mg/dL — ABNORMAL LOW (ref 8.9–10.3)
Chloride: 96 mmol/L — ABNORMAL LOW (ref 98–111)
Creatinine, Ser: 0.98 mg/dL (ref 0.61–1.24)
GFR, Estimated: >60 mL/min (ref >60)
Glucose, Bld: 109 mg/dL — ABNORMAL HIGH (ref 70–99)
Potassium: 4.2 mmol/L (ref 3.5–5.1)
Sodium: 134 mmol/L — ABNORMAL LOW (ref 135–145)
Total Bilirubin: 1.1 mg/dL (ref 0.3–1.2)
Total Protein: 7 g/dL (ref 6.5–8.1)

## 2021-06-29 LAB — CBC
HCT: 39.3 % (ref 39.0–52.0)
Hemoglobin: 12.8 g/dL — ABNORMAL LOW (ref 13.0–17.0)
MCH: 31.9 pg (ref 26.0–34.0)
MCHC: 32.6 g/dL (ref 30.0–36.0)
MCV: 98 fL (ref 80.0–100.0)
Platelets: 187 10*3/uL (ref 150–400)
RBC: 4.01 MIL/uL — ABNORMAL LOW (ref 4.22–5.81)
RDW: 12.5 % (ref 11.5–15.5)
WBC: 10.1 10*3/uL (ref 4.0–10.5)
nRBC: 0 % (ref 0.0–0.2)

## 2021-06-29 LAB — RESP PANEL BY RT-PCR (FLU A&B, COVID) ARPGX2
Influenza A by PCR: NEGATIVE
Influenza B by PCR: NEGATIVE
SARS Coronavirus 2 by RT PCR: NEGATIVE

## 2021-06-29 LAB — CBG MONITORING, ED: Glucose-Capillary: 108 mg/dL — ABNORMAL HIGH (ref 70–99)

## 2021-06-29 MED ORDER — CLOPIDOGREL BISULFATE 75 MG PO TABS
75.0000 mg | ORAL_TABLET | Freq: Every day | ORAL | Status: DC
  Administered 2021-06-30 – 2021-07-02 (×3): 75 mg via ORAL
  Filled 2021-06-29 (×4): qty 1

## 2021-06-29 MED ORDER — SODIUM CHLORIDE 0.9 % IV SOLN
1.0000 g | INTRAVENOUS | Status: DC
  Administered 2021-06-30 – 2021-07-01 (×2): 1 g via INTRAVENOUS
  Filled 2021-06-29 (×2): qty 10

## 2021-06-29 MED ORDER — SODIUM CHLORIDE 0.9 % IV SOLN: INTRAVENOUS | Status: AC

## 2021-06-29 MED ORDER — ATORVASTATIN CALCIUM 10 MG PO TABS
10.0000 mg | ORAL_TABLET | Freq: Every day | ORAL | Status: DC
  Administered 2021-06-29 – 2021-07-01 (×3): 10 mg via ORAL
  Filled 2021-06-29 (×3): qty 1

## 2021-06-29 MED ORDER — ONDANSETRON HCL 4 MG PO TABS: 4.0000 mg | ORAL_TABLET | Freq: Four times a day (QID) | ORAL | Status: DC | PRN

## 2021-06-29 MED ORDER — SODIUM CHLORIDE 0.9 % IV SOLN
1.0000 g | Freq: Once | INTRAVENOUS | Status: AC
  Administered 2021-06-29: 1 g via INTRAVENOUS
  Filled 2021-06-29: qty 10

## 2021-06-29 MED ORDER — ACETAMINOPHEN 650 MG RE SUPP: 650.0000 mg | Freq: Four times a day (QID) | RECTAL | Status: DC | PRN

## 2021-06-29 MED ORDER — ACETAMINOPHEN 325 MG PO TABS
650.0000 mg | ORAL_TABLET | Freq: Four times a day (QID) | ORAL | Status: DC | PRN
  Administered 2021-06-29 – 2021-07-01 (×4): 650 mg via ORAL
  Filled 2021-06-29 (×4): qty 2

## 2021-06-29 MED ORDER — TAMSULOSIN HCL 0.4 MG PO CAPS
0.4000 mg | ORAL_CAPSULE | Freq: Every day | ORAL | Status: DC
  Administered 2021-06-29 – 2021-07-01 (×3): 0.4 mg via ORAL
  Filled 2021-06-29 (×4): qty 1

## 2021-06-29 MED ORDER — POLYETHYLENE GLYCOL 3350 17 G PO PACK: 17.0000 g | PACK | Freq: Every day | ORAL | Status: DC | PRN

## 2021-06-29 MED ORDER — LORATADINE 10 MG PO TABS
10.0000 mg | ORAL_TABLET | Freq: Every day | ORAL | Status: DC
  Administered 2021-06-29 – 2021-07-02 (×4): 10 mg via ORAL
  Filled 2021-06-29 (×4): qty 1

## 2021-06-29 MED ORDER — ONDANSETRON HCL 4 MG/2ML IJ SOLN: 4.0000 mg | Freq: Four times a day (QID) | INTRAMUSCULAR | Status: DC | PRN

## 2021-06-29 MED ORDER — ENOXAPARIN SODIUM 40 MG/0.4ML IJ SOSY
40.0000 mg | PREFILLED_SYRINGE | INTRAMUSCULAR | Status: DC
  Administered 2021-06-29 – 2021-07-01 (×3): 40 mg via SUBCUTANEOUS
  Filled 2021-06-29 (×4): qty 0.4

## 2021-06-29 NOTE — ED Provider Notes (Signed)
[place] Provider Note   CSN: [id number] Arrival date & time: 06/29/21  1345     History  Chief Complaint  Patient presents with   Weakness    Paul Bradshaw is a 88 y.o. male with a past medical history of hypertension, CAD, who presents to the ED complaining of generalized weakness onset today.  Patient notes he had trouble getting out of bed this morning due to generalized weakness.  Patient has associated dysuria x1 week.  He has not tried any medications for her symptoms.  Denies chest pain, shortness of breath, fever, chills, abdominal pain, nausea, vomiting, hematuria, dizziness, lightheadedness, back pain, LOC. Denies history of diabetes, arrhythmia    The history is provided by the patient. No language interpreter was used.      Home Medications Prior to Admission medications   Medication Sig Start Date End Date Taking? Authorizing Provider  acetaminophen (TYLENOL) 500 MG tablet Take 1,000 mg by mouth every 8 (eight) hours as needed for moderate pain.    [provider]  atorvastatin (LIPITOR) 10 MG tablet TAKE ONE-HALF TABLET BY  MOUTH DAILY AT 6 PM Patient taking differently: Take 10 mg by mouth daily. 03/29/21   Loman Brooklyn, FNP  cetirizine (ZYRTEC) 10 MG tablet Take 10 mg by mouth daily.    [provider]  clopidogrel (PLAVIX) 75 MG tablet TAKE 1 TABLET BY MOUTH  DAILY WITH BREAKFAST 03/29/21   Hendricks Limes F, FNP  Multiple Vitamins-Minerals (MULTIVITAMIN PO) Take 1 tablet by mouth daily.    [provider]  tamsulosin (FLOMAX) 0.4 MG CAPS capsule TAKE 1 CAPSULE BY MOUTH IN  THE EVENING Patient taking differently: Take 0.4 mg by mouth daily. 03/29/21   Loman Brooklyn, FNP  Turmeric 1053 MG TABS Take by mouth.    [provider]      Allergies    Amlodipine and Lisinopril    Review of Systems   Review of Systems  Constitutional:  Negative for chills and fever.  Respiratory:  Negative for shortness  of breath.   Cardiovascular:  Negative for chest pain.  Gastrointestinal:  Negative for abdominal pain, nausea and vomiting.  Genitourinary:  Negative for dysuria and hematuria.  Skin:  Negative for rash.  Neurological:  Positive for weakness. Negative for dizziness and light-headedness.  All other systems reviewed and are negative.  Physical Exam Updated Vital Signs BP (!) 180/82 (BP Location: Right Arm)    Pulse 95    Temp 100 F (37.8 C) (Oral)    Resp 18    Ht 5\' 5"  (1.651 m)    Wt 72.6 kg    SpO2 100%    BMI 26.63 kg/m  Physical Exam Vitals and nursing note reviewed.  Constitutional:      General: He is not in acute distress.    Appearance: He is not diaphoretic.  HENT:     Head: Normocephalic and atraumatic.     Mouth/Throat:     Pharynx: No oropharyngeal exudate.  Eyes:     General: No scleral icterus.    Conjunctiva/sclera: Conjunctivae normal.  Cardiovascular:     Rate and Rhythm: Normal rate and regular rhythm.     Pulses: Normal pulses.     Heart sounds: Normal heart sounds.  Pulmonary:     Effort: Pulmonary effort is normal. No respiratory distress.     Breath sounds: Normal breath sounds. No wheezing.  Abdominal:     General: Bowel sounds are normal.  Palpations: Abdomen is soft. There is no mass.     Tenderness: There is no abdominal tenderness. There is no guarding or rebound.     Comments: No abdominal tenderness to palpation.  No overlying skin changes.  Musculoskeletal:        General: Normal range of motion.     Cervical back: Normal range of motion and neck supple.     Comments: Strength and sensation intact to bilateral upper and lower extremities.  No C, T, L, S spinal tenderness to palpation.  No overlying skin changes.  Skin:    General: Skin is warm and dry.  Neurological:     Mental Status: He is alert.     Cranial Nerves: Cranial nerves 2-12 are intact.     Sensory: Sensation is intact.     Motor: No pronator drift.  Psychiatric:         Behavior: Behavior normal.    ED Results / Procedures / Treatments   Labs (all labs ordered are listed, but only abnormal results are displayed) Labs Reviewed - No data to display  EKG None  Radiology No results found.  Procedures Procedures    Medications Ordered in ED Medications - No data to display  ED Course/ Medical Decision Making/ A&P Clinical Course as of 06/29/21 1643  Sat Jun 29, 2021  1538 Case discussed with attending who agrees with admission at this time. [SB]  1554 Consult with hospitalist, Dr. Denton Brick who agrees to admission.  [SB]  1600 Pt notified of admission to hospital for UTI treatment and agreeable at this time.  [SB]    Clinical Course User Index [SB] Brette Cast A, PA-C                           Medical Decision Making  Patient presents to the ED with generalized weakness onset today.  Patient also with dysuria x1 week.  No meds tried for symptoms.  Denies history of diabetes or arrhythmia.  Vital signs stable, patient afebrile, not tachycardic or hypoxic.  Differential diagnosis includes pneumonia, acute cystitis, hypoglycemia, arrhythmia, anemia.   Labs:  I ordered, and personally interpreted labs.  The pertinent results include: CBG at 108.  CBC with stable findings with WBC at 10.1, hemoglobin at 12.8. CMP with stable findings, sodium 134. Urinalysis negative for moderate hemoglobin, large amount of leukocytes.  Imaging: I ordered imaging studies including chest x-ray I independently visualized and interpreted imaging which showed no acute infiltrates. I agree with the radiologist interpretation  Medications:  I ordered medication including Rocephin for UTI treatment  Consultations: I requested consultation with the hospitalist, Dr. Denton Brick,  and discussed lab and imaging findings as well as pertinent plan - they recommend: Admission.   Disposition: Patient presentation suspicious for acute cystitis as cause of her generalized  weakness.  Less likely pneumonia, arrhythmia, anemia or hypoglycemia as cause. After consideration of the diagnostic results and the patients response to treatment, I feel that the patent would benefit from admission to the hospital for further evaluation and UTI treatment.  Case discussed with attending who agrees with admission to the hospital.  Discussed admission treatment plan with patient at bedside.  Patient agreeable at this time.  This chart was dictated using voice recognition software, Dragon. Despite the best efforts of this provider to proofread and correct errors, errors may still occur which can change documentation meaning.  Final Clinical Impression(s) / ED Diagnoses Final diagnoses:  Weakness  Acute cystitis with hematuria    Rx / DC Orders ED Discharge Orders     None         Julyan Gales A, PA-C 06/29/21 1644    Davonna Belling, MD 07/01/21 [phone]

## 2021-06-29 NOTE — ED Triage Notes (Signed)
Patient to ED from home with complaints of possible UTI and generalized weakness. States that he is having dysuria with dark, foul smell to urine. UTI dx this past Nov. And symptoms are same per patient.

## 2021-06-29 NOTE — H&P (Signed)
History and Physical    Paul Bradshaw AOZ:[id number] DOB: 11-14-1925 DOA: 06/29/2021  PCP: Gwenlyn FudgeJoyce, Britney F, FNP   Patient coming from: Home  I have personally briefly reviewed patient's [facility]  Chief Complaint: Weakness, Pain with urination  HPI: Paul Bradshaw is a 88 y.o. male with medical history significant for coronary artery disease, aortic regurgitation, hypertension, prediabetes. Patient presented to the ED with complaints of generalized weakness, and pain with urination that he noticed today.  Reports similar symptoms requiring recent hospitalization.  He tells me he was so weak was unable to even turn in bed or get up.  Recent hospitalization 11/14-11/17 for severe sepsis due to UTI/cystitis and bacteremia.  Blood Cultures grew pansensitive E. coli.  Also had generalized weakness with debility, evaluated by physical therapy and home health PT was recommended.  Patient was treated with IV ceftriaxone and discharged home on ciprofloxacin.  ED Course: Temperature 100.  Heart rate 86-95, respiratory rate 18-25.  Blood pressure systolic 160s to [id number].  O2 sats 95-100 on room air.  WBC 10.1.  Chest x-ray without acute abnormality.  UA suggestive of UTI with large leukocytes many bacteria moderate hemoglobin. With generalized weakness, patient unable to get up in the ED, hospitalist was called to admit for UTI.  Review of Systems: As per HPI all other systems reviewed and negative.  Past Medical History:  Diagnosis Date   Aortic regurgitation    Moderate   Arthritis    BPH (benign prostatic hyperplasia)    Cervical disc disease    Cervical radiculopathy    Hyperlipidemia    Hypertension    Prediabetes 02/16/2021   Staphylococcus aureus bacteremia 08/09/2012   TEE negative for vegetation or thrombus February 2014   Stroke [facility](HCC) 2005 or 2006   3   Symptomatic carotid artery stenosis with infarction [facility](HCC) 2005 or 2006   Status post right carotid  endarterectomy    Past Surgical History:  Procedure Laterality Date   BACK SURGERY     CAROTID ENDARTERECTOMY     CATARACT EXTRACTION W/PHACO Left 02/15/2015   Procedure: CATARACT EXTRACTION PHACO AND INTRAOCULAR LENS PLACEMENT LEFT EYE CDE=30.93;  Surgeon: Gemma PayorKerry Hunt, MD;  Location: [facility] ORS;  Service: Ophthalmology;  Laterality: Left;   CHOLECYSTECTOMY     KIDNEY STONE SURGERY     LUMBAR LAMINECTOMY/DECOMPRESSION MICRODISCECTOMY Bilateral 01/23/2014   Procedure: LUMBAR LAMINECTOMY/DECOMPRESSION MICRODISCECTOMY 1 LEVEL L5-S1;  Surgeon: Temple PaciniHenry A Pool, MD;  Location: [facility] NEURO ORS;  Service: Neurosurgery;  Laterality: Bilateral;  LUMBAR LAMINECTOMY/DECOMPRESSION MICRODISCECTOMY 1 LEVEL L5-S1   TEE WITHOUT CARDIOVERSION N/A 08/12/2012   Procedure: TRANSESOPHAGEAL ECHOCARDIOGRAM (TEE);  Surgeon: Wendall StadePeter C Nishan, MD;  Location: [facility] ENDO SUITE;  Service: Cardiovascular;  Laterality: N/A;   TEE WITHOUT CARDIOVERSION N/A 06/24/2013   Procedure: TRANSESOPHAGEAL ECHOCARDIOGRAM (TEE);  Surgeon: Jonelle SidleSamuel G McDowell, MD;  Location: [facility] ENDO SUITE;  Service: Endoscopy;  Laterality: N/A;     reports that he has never smoked. He has never used smokeless tobacco. He reports that he does not drink alcohol and does not use drugs.  Allergies  Allergen Reactions   Amlodipine Swelling    Swelling of lips.   Lisinopril Other (See Comments)    Elevated BP higher & causes a burning feeling on the inside.     Family History  Problem Relation Age of Onset   Emphysema Father    Alzheimer's disease Mother    Heart disease Brother    Heart attack Brother  COPD Son    Prior to Admission medications   Medication Sig Start Date End Date Taking? Authorizing Provider  acetaminophen (TYLENOL) 500 MG tablet Take 1,000 mg by mouth every 8 (eight) hours as needed for moderate pain.    [provider]  atorvastatin (LIPITOR) 10 MG tablet TAKE ONE-HALF TABLET BY  MOUTH DAILY AT 6 PM Patient taking differently: Take 10  mg by mouth daily. 03/29/21   Gwenlyn Fudge, FNP  cetirizine (ZYRTEC) 10 MG tablet Take 10 mg by mouth daily.    [provider]  clopidogrel (PLAVIX) 75 MG tablet TAKE 1 TABLET BY MOUTH  DAILY WITH BREAKFAST 03/29/21   Deliah Boston F, FNP  Multiple Vitamins-Minerals (MULTIVITAMIN PO) Take 1 tablet by mouth daily.    [provider]  tamsulosin (FLOMAX) 0.4 MG CAPS capsule TAKE 1 CAPSULE BY MOUTH IN  THE EVENING Patient taking differently: Take 0.4 mg by mouth daily. 03/29/21   Gwenlyn Fudge, FNP  Turmeric 1053 MG TABS Take by mouth.    [provider]    Physical Exam: Vitals:   06/29/21 1415 06/29/21 1430 06/29/21 1445 06/29/21 1500  BP:  (!) 163/94  (!) 164/76  Pulse: 88 88 86 87  Resp: 18 19 (!) 25 (!) 23  Temp:      TempSrc:      SpO2: 95% 94% 97% 96%  Weight:      Height:        Constitutional:  calm, comfortable, appears to be hard of hearing Vitals:   06/29/21 1415 06/29/21 1430 06/29/21 1445 06/29/21 1500  BP:  (!) 163/94  (!) 164/76  Pulse: 88 88 86 87  Resp: 18 19 (!) 25 (!) 23  Temp:      TempSrc:      SpO2: 95% 94% 97% 96%  Weight:      Height:       Eyes: PERRL, lids and conjunctivae normal ENMT: Mucous membranes are moist.  Neck: normal, supple, no masses, no thyromegaly Respiratory: clear to auscultation bilaterally, no wheezing, no crackles. Normal respiratory effort. No accessory muscle use.  Cardiovascular: Regular rate and rhythm, 2/6 systolic murmur right upper sternal area . No extremity edema. 2 lower extremities warm. Abdomen: no tenderness, no masses palpated. No hepatosplenomegaly. Bowel sounds positive.  Musculoskeletal: no clubbing / cyanosis. No joint deformity upper and lower extremities. Good ROM, no contractures. Normal muscle tone.  Skin: no rashes, lesions, ulcers. No induration Neurologic: No apparent cranial nerve abnormality, 4+5 strength in all extremities. Psychiatric: Normal judgment and insight. Alert  and oriented x 3. Normal mood.   Labs on Admission: I have personally reviewed following labs and imaging studies  CBC: Recent Labs  Lab 06/29/21 1442  WBC 10.1  HGB 12.8*  HCT 39.3  MCV 98.0  PLT 187   Basic Metabolic Panel: Recent Labs  Lab 06/29/21 1442  NA 134*  K 4.2  CL 96*  CO2 28  GLUCOSE 109*  BUN 11  CREATININE 0.98  CALCIUM 8.8*   GFR: Estimated Creatinine Clearance: 41 mL/min (by C-G formula based on SCr of 0.98 mg/dL). Liver Function Tests: Recent Labs  Lab 06/29/21 1442  AST 27  ALT 14  ALKPHOS 59  BILITOT 1.1  PROT 7.0  ALBUMIN 4.0   CBG: Recent Labs  Lab 06/29/21 1444  GLUCAP 108*   Urine analysis:    Component Value Date/Time   COLORURINE YELLOW 06/29/2021 1438   APPEARANCEUR HAZY (A) 06/29/2021 1438  LABSPEC 1.014 06/29/2021 1438   PHURINE 7.0 06/29/2021 1438   GLUCOSEU NEGATIVE 06/29/2021 1438   HGBUR MODERATE (A) 06/29/2021 1438   BILIRUBINUR NEGATIVE 06/29/2021 1438   KETONESUR 20 (A) 06/29/2021 1438   PROTEINUR 30 (A) 06/29/2021 1438   UROBILINOGEN 2.0 (H) 08/08/2012 1104   NITRITE NEGATIVE 06/29/2021 1438   LEUKOCYTESUR LARGE (A) 06/29/2021 1438    Radiological Exams on Admission: DG Chest 2 View  Result Date: 06/29/2021 CLINICAL DATA:  complaints of possible UTI and generalized weakness. States that he is having dysuria with dark, foul smell to urine. EXAM: CHEST - 2 VIEW COMPARISON:  05/06/2021. FINDINGS: Cardiac silhouette normal in size. Moderate hiatal hernia. No mediastinal or hilar masses or evidence of adenopathy. Clear lungs.  No pleural effusion or pneumothorax. Skeletal structures are intact. IMPRESSION: No active cardiopulmonary disease. Electronically Signed   By: Amie Portland M.D.   On: 06/29/2021 15:11    EKG: Independently reviewed.  Sinus rhythm, rate 92, QTc 425.  LAFB no significant change from prior.  Assessment/Plan Principal Problem:   Generalized weakness Active Problems:   Cystitis   BPH (benign  prostatic hyperplasia)   History of CVA (cerebrovascular accident)   Generalized weakness 2/2 urinary tract infection-UA suggestive with large leukocytes and many bacteria.  Rules out for sepsis.  Temperature 100.  WBC 10.1.  Recent hospitalization 04/2021 for same with severe sepsis-UTI and bacteremia.  Cultures growing pansensitive E. Coli, With intermediate sensitivity to Cipro.  No focal deficits. -IV ceftriaxone 1 g daily -Follow-up urine cultures -PT evaluation - N/s 75 cc/hr x 15hrs  BPH -Resume tamsulosin  CVA -Resume Plavix, statins  Prediabetes A1c 5.8 01/2021.  Elevated blood pressure systolic 160s to 510C.  - Resume Tamsulosin -As needed labetalol for systolic greater than 180   DVT prophylaxis: lovenox Code Status:  Full code Family Communication: None at bedside. Disposition Plan: ~ 2 days. Consults called: None Admission status:  Obs, Med surg.    Onnie Boer MD [facility]  06/29/2021, 4:33 PM

## 2021-06-30 DIAGNOSIS — N400 Enlarged prostate without lower urinary tract symptoms: Secondary | ICD-10-CM | POA: Diagnosis not present

## 2021-06-30 DIAGNOSIS — N390 Urinary tract infection, site not specified: Secondary | ICD-10-CM | POA: Diagnosis not present

## 2021-06-30 DIAGNOSIS — R531 Weakness: Secondary | ICD-10-CM | POA: Diagnosis not present

## 2021-06-30 LAB — CBC
HCT: 37.2 % — ABNORMAL LOW (ref 39.0–52.0)
Hemoglobin: 11.9 g/dL — ABNORMAL LOW (ref 13.0–17.0)
MCH: 31.2 pg (ref 26.0–34.0)
MCHC: 32 g/dL (ref 30.0–36.0)
MCV: 97.6 fL (ref 80.0–100.0)
Platelets: 166 10*3/uL (ref 150–400)
RBC: 3.81 MIL/uL — ABNORMAL LOW (ref 4.22–5.81)
RDW: 12.6 % (ref 11.5–15.5)
WBC: 8.3 10*3/uL (ref 4.0–10.5)
nRBC: 0 % (ref 0.0–0.2)

## 2021-06-30 LAB — COMPREHENSIVE METABOLIC PANEL
ALT: 12 U/L (ref 0–44)
AST: 27 U/L (ref 15–41)
Albumin: 3.4 g/dL — ABNORMAL LOW (ref 3.5–5.0)
Alkaline Phosphatase: 49 U/L (ref 38–126)
Anion gap: 8 (ref 5–15)
BUN: 12 mg/dL (ref 8–23)
CO2: 27 mmol/L (ref 22–32)
Calcium: 8.2 mg/dL — ABNORMAL LOW (ref 8.9–10.3)
Chloride: 99 mmol/L (ref 98–111)
Creatinine, Ser: 0.87 mg/dL (ref 0.61–1.24)
GFR, Estimated: >60 mL/min (ref >60)
Glucose, Bld: 110 mg/dL — ABNORMAL HIGH (ref 70–99)
Potassium: 3.9 mmol/L (ref 3.5–5.1)
Sodium: 134 mmol/L — ABNORMAL LOW (ref 135–145)
Total Bilirubin: 0.8 mg/dL (ref 0.3–1.2)
Total Protein: 6.2 g/dL — ABNORMAL LOW (ref 6.5–8.1)

## 2021-06-30 LAB — BASIC METABOLIC PANEL
Anion gap: 9 (ref 5–15)
BUN: 14 mg/dL (ref 8–23)
CO2: 23 mmol/L (ref 22–32)
Calcium: 8 mg/dL — ABNORMAL LOW (ref 8.9–10.3)
Chloride: 102 mmol/L (ref 98–111)
Creatinine, Ser: 0.98 mg/dL (ref 0.61–1.24)
GFR, Estimated: >60 mL/min (ref >60)
Glucose, Bld: 153 mg/dL — ABNORMAL HIGH (ref 70–99)
Potassium: 3.6 mmol/L (ref 3.5–5.1)
Sodium: 134 mmol/L — ABNORMAL LOW (ref 135–145)

## 2021-06-30 LAB — PHOSPHORUS: Phosphorus: 2.7 mg/dL (ref 2.5–4.6)

## 2021-06-30 LAB — MAGNESIUM: Magnesium: 1.8 mg/dL (ref 1.7–2.4)

## 2021-06-30 MED ORDER — DEXTROSE 50 % IV SOLN
INTRAVENOUS | Status: AC
  Filled 2021-06-30: qty 50

## 2021-06-30 MED ORDER — DEXTROSE 50 % IV SOLN: 25.0000 g | INTRAVENOUS | Status: DC

## 2021-06-30 NOTE — Progress Notes (Signed)
PROGRESS NOTE    Paul Bradshaw  [id number] DOB: 03-11-1926 DOA: 06/29/2021 PCP: Gwenlyn Fudge, FNP     Brief Narrative:   88 y.o. WM PMHx coronary artery disease, aortic regurgitation, hypertension, prediabetes.  Presented to the ED with complaints of generalized weakness, and pain with urination that he noticed today.  Reports similar symptoms requiring recent hospitalization.  He tells me he was so weak was unable to even turn in bed or get up.   Recent hospitalization 11/14-11/17 for severe sepsis due to UTI/cystitis and bacteremia.  Blood Cultures grew pansensitive E. coli.  Also had generalized weakness with debility, evaluated by physical therapy and home health PT was recommended.  Patient was treated with IV ceftriaxone and discharged home on ciprofloxacin.   ED Course: Temperature 100.  Heart rate 86-95, respiratory rate 18-25.  Blood pressure systolic 160s to 272Z.  O2 sats 95-100 on room air.  WBC 10.1.  Chest x-ray without acute abnormality.  UA suggestive of UTI with large leukocytes many bacteria moderate hemoglobin. With generalized weakness, patient unable to get up in the ED, hospitalist was called to admit for UTI.   Subjective: T-max overnight 38.9 C, A/O x4, no complaints.  States feels much better   Assessment & Plan: Covid vaccination;   Principal Problem:   Generalized weakness Active Problems:   BPH (benign prostatic hyperplasia)   History of CVA (cerebrovascular accident)   Cystitis   Generalized weakness 2/2 urinary tract infection- -UA suggestive with large leukocytes and many bacteria.  Rules out for sepsis.  Temperature 100.  WBC 10.1.   -Recent hospitalization 04/2021 for same with severe sepsis-UTI and bacteremia.  Cultures growing pansensitive E. Coli, With intermediate sensitivity to Cipro.  No focal deficits. -IV ceftriaxone 1 g daily x5 days -Follow-up urine cultures NGTD -1/8 PT/OT evaluation; evaluate for SNF vs home PT    BPH -Flomax 0.4 mg daily   CVA -Plavix 75 mg daily - Lipitor 10 mg daily - Lipid panel pending   Prediabetes  -01/2021 hemoglobin A1c =5.8  -1/8 we will leave to PCP to determine if he would like to start low-dose Metformin   Essential HTN -Though slightly elevated given his age unless he has a sustained SBP> 160 would not add medication       DVT prophylaxis: Lovenox Code Status: Full Family Communication:  Status is: Inpatient    Dispo: The patient is from: Home              Anticipated d/c is to:  Will await PT/OT recommendation SNF vs home PT              Anticipated d/c date is: 2 days              Patient currently is not medically stable to d/c.      Consultants:    Procedures/Significant Events:    I have personally reviewed and interpreted all radiology studies and my findings are as above.  VENTILATOR SETTINGS:    Cultures 1/7 urine pending   Antimicrobials: Anti-infectives (From admission, onward)    Start     Ordered Stop   06/30/21 1500  cefTRIAXone (ROCEPHIN) 1 g in sodium chloride 0.9 % 100 mL IVPB        06/29/21 1652     06/29/21 1545  cefTRIAXone (ROCEPHIN) 1 g in sodium chloride 0.9 % 100 mL IVPB        06/29/21 1544 06/29/21 1634  Devices    LINES / TUBES:      Continuous Infusions:  cefTRIAXone (ROCEPHIN)  IV       Objective: Vitals:   06/29/21 2101 06/30/21 0102 06/30/21 0455 06/30/21 0600  BP: (!) 147/72 (!) 152/73 (!) 142/67   Pulse: 95 82 100   Resp: 20 20 18    Temp: (!) 100.7 F (38.2 C) 98.8 F (37.1 C) (!) 102 F (38.9 C) 99.5 F (37.5 C)  TempSrc: Oral Oral Oral Oral  SpO2: 98% 97% 95%   Weight:      Height:        Intake/Output Summary (Last 24 hours) at 06/30/2021 [id number] Last data filed at 06/30/2021 0320 Gross per 24 hour  Intake 340 ml  Output 775 ml  Net -435 ml   Filed Weights   06/29/21 1351  Weight: 72.6 kg    Examination:  General:/O x4 No acute respiratory  distress Eyes: negative scleral hemorrhage, negative anisocoria, negative icterus ENT: Negative Runny nose, negative gingival bleeding, Neck:  Negative scars, masses, torticollis, lymphadenopathy, JVD Lungs: Clear to auscultation bilaterally without wheezes or crackles Cardiovascular: Regular rate and rhythm without murmur gallop or rub normal S1 and S2 Abdomen: negative abdominal pain, nondistended, positive soft, bowel sounds, no rebound, no ascites, no appreciable mass Extremities: No significant cyanosis, clubbing, or edema bilateral lower extremities Skin: Negative rashes, lesions, ulcers Psychiatric:  Negative depression, negative anxiety, negative fatigue, negative mania  Central nervous system:  Cranial nerves II through XII intact, tongue/uvula midline, all extremities muscle strength 5/5, sensation intact throughout, negative dysarthria, negative expressive aphasia, negative receptive aphasia.  .     Data Reviewed: Care during the described time interval was provided by me .  I have reviewed this patient's available data, including medical history, events of note, physical examination, and all test results as part of my evaluation.  CBC: Recent Labs  Lab 06/29/21 1442 06/30/21 0526  WBC 10.1 8.3  HGB 12.8* 11.9*  HCT 39.3 37.2*  MCV 98.0 97.6  PLT 187 166   Basic Metabolic Panel: Recent Labs  Lab 06/29/21 1442  NA 134*  K 4.2  CL 96*  CO2 28  GLUCOSE 109*  BUN 11  CREATININE 0.98  CALCIUM 8.8*   GFR: Estimated Creatinine Clearance: 41 mL/min (by C-G formula based on SCr of 0.98 mg/dL). Liver Function Tests: Recent Labs  Lab 06/29/21 1442  AST 27  ALT 14  ALKPHOS 59  BILITOT 1.1  PROT 7.0  ALBUMIN 4.0   No results for input(s): LIPASE, AMYLASE in the last 168 hours. No results for input(s): AMMONIA in the last 168 hours. Coagulation Profile: No results for input(s): INR, PROTIME in the last 168 hours. Cardiac Enzymes: No results for input(s):  CKTOTAL, CKMB, CKMBINDEX, TROPONINI in the last 168 hours. BNP (last 3 results) No results for input(s): PROBNP in the last 8760 hours. HbA1C: No results for input(s): HGBA1C in the last 72 hours. CBG: Recent Labs  Lab 06/29/21 1444  GLUCAP 108*   Lipid Profile: No results for input(s): CHOL, HDL, LDLCALC, TRIG, CHOLHDL, LDLDIRECT in the last 72 hours. Thyroid Function Tests: No results for input(s): TSH, T4TOTAL, FREET4, T3FREE, THYROIDAB in the last 72 hours. Anemia Panel: No results for input(s): VITAMINB12, FOLATE, FERRITIN, TIBC, IRON, RETICCTPCT in the last 72 hours. Sepsis Labs: No results for input(s): PROCALCITON, LATICACIDVEN in the last 168 hours.  Recent Results (from the past 240 hour(s))  Resp Panel by RT-PCR (Flu A&B, Covid) Nasopharyngeal Swab  Status: None   Collection Time: 06/29/21  3:56 PM   Specimen: Nasopharyngeal Swab; Nasopharyngeal(NP) swabs in vial transport medium  Result Value Ref Range Status   SARS Coronavirus 2 by RT PCR NEGATIVE NEGATIVE Final    Comment: (NOTE) SARS-CoV-2 target nucleic acids are NOT DETECTED.  The SARS-CoV-2 RNA is generally detectable in upper respiratory specimens during the acute phase of infection. The lowest concentration of SARS-CoV-2 viral copies this assay can detect is 138 copies/mL. A negative result does not preclude SARS-Cov-2 infection and should not be used as the sole basis for treatment or other patient management decisions. A negative result may occur with  improper specimen collection/handling, submission of specimen other than nasopharyngeal swab, presence of viral mutation(s) within the areas targeted by this assay, and inadequate number of viral copies(<138 copies/mL). A negative result must be combined with clinical observations, patient history, and epidemiological information. The expected result is Negative.  Fact Sheet for Patients:  BloggerCourse.com  Fact Sheet for  Healthcare Providers:  SeriousBroker.it  This test is no t yet approved or cleared by the [place] FDA and  has been authorized for detection and/or diagnosis of SARS-CoV-2 by FDA under an Emergency Use Authorization (EUA). This EUA will remain  in effect (meaning this test can be used) for the duration of the COVID-19 declaration under Section 564(b)(1) of the Act, 21 U.S.C.section 360bbb-3(b)(1), unless the authorization is terminated  or revoked sooner.       Influenza A by PCR NEGATIVE NEGATIVE Final   Influenza B by PCR NEGATIVE NEGATIVE Final    Comment: (NOTE) The Xpert Xpress SARS-CoV-2/FLU/RSV plus assay is intended as an aid in the diagnosis of influenza from Nasopharyngeal swab specimens and should not be used as a sole basis for treatment. Nasal washings and aspirates are unacceptable for Xpert Xpress SARS-CoV-2/FLU/RSV testing.  Fact Sheet for Patients: BloggerCourse.com  Fact Sheet for Healthcare Providers: SeriousBroker.it  This test is not yet approved or cleared by the [place] FDA and has been authorized for detection and/or diagnosis of SARS-CoV-2 by FDA under an Emergency Use Authorization (EUA). This EUA will remain in effect (meaning this test can be used) for the duration of the COVID-19 declaration under Section 564(b)(1) of the Act, 21 U.S.C. section 360bbb-3(b)(1), unless the authorization is terminated or revoked.  Performed at [facility], [address], [place], [place] [address]          Radiology Studies: DG Chest 2 View  Result Date: 06/29/2021 CLINICAL DATA:  complaints of possible UTI and generalized weakness. States that he is having dysuria with dark, foul smell to urine. EXAM: CHEST - 2 VIEW COMPARISON:  05/06/2021. FINDINGS: Cardiac silhouette normal in size. Moderate hiatal hernia. No mediastinal or hilar masses or evidence of adenopathy. Clear  lungs.  No pleural effusion or pneumothorax. Skeletal structures are intact. IMPRESSION: No active cardiopulmonary disease. Electronically Signed   By: Amie Portland M.D.   On: 06/29/2021 15:11        Scheduled Meds:  atorvastatin  10 mg Oral q1800   clopidogrel  75 mg Oral Q breakfast   enoxaparin (LOVENOX) injection  40 mg Subcutaneous Q24H   loratadine  10 mg Oral Daily   tamsulosin  0.4 mg Oral QHS   Continuous Infusions:  cefTRIAXone (ROCEPHIN)  IV       LOS: 0 days    Time spent:40 min    Rockne Dearinger, Roselind Messier, MD [facility]   If 7PM-7AM, please contact night-coverage 06/30/2021,  9:24 AM

## 2021-07-01 ENCOUNTER — Encounter (HOSPITAL_COMMUNITY): Payer: Self-pay | Admitting: Internal Medicine

## 2021-07-01 DIAGNOSIS — N3090 Cystitis, unspecified without hematuria: Secondary | ICD-10-CM | POA: Diagnosis not present

## 2021-07-01 DIAGNOSIS — R531 Weakness: Secondary | ICD-10-CM | POA: Diagnosis not present

## 2021-07-01 LAB — COMPREHENSIVE METABOLIC PANEL
ALT: 13 U/L (ref 0–44)
AST: 28 U/L (ref 15–41)
Albumin: 3.2 g/dL — ABNORMAL LOW (ref 3.5–5.0)
Alkaline Phosphatase: 44 U/L (ref 38–126)
Anion gap: 11 (ref 5–15)
BUN: 13 mg/dL (ref 8–23)
CO2: 25 mmol/L (ref 22–32)
Calcium: 8.4 mg/dL — ABNORMAL LOW (ref 8.9–10.3)
Chloride: 100 mmol/L (ref 98–111)
Creatinine, Ser: 0.86 mg/dL (ref 0.61–1.24)
GFR, Estimated: >60 mL/min (ref >60)
Glucose, Bld: 103 mg/dL — ABNORMAL HIGH (ref 70–99)
Potassium: 3.6 mmol/L (ref 3.5–5.1)
Sodium: 136 mmol/L (ref 135–145)
Total Bilirubin: 0.7 mg/dL (ref 0.3–1.2)
Total Protein: 6 g/dL — ABNORMAL LOW (ref 6.5–8.1)

## 2021-07-01 LAB — CBC WITH DIFFERENTIAL/PLATELET
Abs Immature Granulocytes: 0.02 10*3/uL (ref 0.00–0.07)
Basophils Absolute: 0 10*3/uL (ref 0.0–0.1)
Basophils Relative: 1 %
Eosinophils Absolute: 0 10*3/uL (ref 0.0–0.5)
Eosinophils Relative: 1 %
HCT: 35.7 % — ABNORMAL LOW (ref 39.0–52.0)
Hemoglobin: 11.3 g/dL — ABNORMAL LOW (ref 13.0–17.0)
Immature Granulocytes: 0 %
Lymphocytes Relative: 18 %
Lymphs Abs: 1.1 10*3/uL (ref 0.7–4.0)
MCH: 30.3 pg (ref 26.0–34.0)
MCHC: 31.7 g/dL (ref 30.0–36.0)
MCV: 95.7 fL (ref 80.0–100.0)
Monocytes Absolute: 0.8 10*3/uL (ref 0.1–1.0)
Monocytes Relative: 13 %
Neutro Abs: 4 10*3/uL (ref 1.7–7.7)
Neutrophils Relative %: 67 %
Platelets: 149 10*3/uL — ABNORMAL LOW (ref 150–400)
RBC: 3.73 MIL/uL — ABNORMAL LOW (ref 4.22–5.81)
RDW: 12.3 % (ref 11.5–15.5)
WBC: 5.9 10*3/uL (ref 4.0–10.5)
nRBC: 0 % (ref 0.0–0.2)

## 2021-07-01 LAB — LIPID PANEL
Cholesterol: 100 mg/dL (ref 0–200)
HDL: 48 mg/dL (ref >40)
LDL Cholesterol: 37 mg/dL (ref 0–99)
Total CHOL/HDL Ratio: 2.1 RATIO
Triglycerides: 74 mg/dL (ref <150)
VLDL: 15 mg/dL (ref 0–40)

## 2021-07-01 LAB — MAGNESIUM: Magnesium: 1.9 mg/dL (ref 1.7–2.4)

## 2021-07-01 LAB — PHOSPHORUS: Phosphorus: 2.6 mg/dL (ref 2.5–4.6)

## 2021-07-01 NOTE — Plan of Care (Signed)
°  Problem: Acute Rehab OT Goals (only OT should resolve) Goal: Pt. Will Perform Eating Flowsheets (Taken 07/01/2021 0953) Pt Will Perform Eating:  with modified independence  sitting Goal: Pt. Will Perform Grooming Flowsheets (Taken 07/01/2021 0953) Pt Will Perform Grooming:  with supervision  standing Goal: Pt. Will Perform Upper Body Dressing Flowsheets (Taken 07/01/2021 0953) Pt Will Perform Upper Body Dressing:  with modified independence  sitting Goal: Pt. Will Perform Lower Body Dressing Flowsheets (Taken 07/01/2021 0953) Pt Will Perform Lower Body Dressing:  with supervision  sitting/lateral leans  sit to/from stand Goal: Pt. Will Transfer To Toilet Flowsheets (Taken 07/01/2021 [phone]) Pt Will Transfer to Toilet:  with supervision  ambulating  regular height toilet Goal: Pt. Will Perform Toileting-Clothing Manipulation Flowsheets (Taken 07/01/2021 0953) Pt Will Perform Toileting - Clothing Manipulation and hygiene:  with supervision  sitting/lateral leans  sit to/from stand Goal: Pt/Caregiver Will Perform Home Exercise Program Flowsheets (Taken 07/01/2021 [phone]) Pt/caregiver will Perform Home Exercise Program:  Increased strength  Both right and left upper extremity  Independently  With written HEP provided

## 2021-07-01 NOTE — Progress Notes (Addendum)
Progress Note    Paul Bradshaw  QBV:[id number] DOB: February 13, 1926  DOA: 06/29/2021 PCP: Gwenlyn Fudge, FNP      Brief Narrative:    Medical records reviewed and are as summarized below:  Paul Bradshaw is a 88 y.o. male with medical history significant for stroke, CAD, aortic regurgitation, hypertension, prediabetes, BPH, who presented to the hospital with generalized weakness and dysuria.  Recently hospitalized from 05/06/2021 through 05/09/2021 for severe sepsis secondary to acute UTI and E. coli bacteremia.        Assessment/Plan:   Principal Problem:   Generalized weakness Active Problems:   BPH (benign prostatic hyperplasia)   History of CVA (cerebrovascular accident)   Cystitis   Body mass index is 26.63 kg/m.  Acute UTI: Urine culture showing E. coli.  Continue IV Rocephin.  Follow-up urine culture sensitivity report.  Generalized weakness: Consult PT and OT.  CAD, history of stroke: Continue Plavix and Lipitor  Other comorbidities include BPH, prediabetes and hypertension.  Diet Order             Diet regular Room service appropriate? Yes; Fluid consistency: Thin  Diet effective now                      Consultants: None  Procedures: None    Medications:    atorvastatin  10 mg Oral q1800   clopidogrel  75 mg Oral Q breakfast   enoxaparin (LOVENOX) injection  40 mg Subcutaneous Q24H   loratadine  10 mg Oral Daily   tamsulosin  0.4 mg Oral QHS   Continuous Infusions:  cefTRIAXone (ROCEPHIN)  IV 1 g (06/30/21 1636)     Anti-infectives (From admission, onward)    Start     Dose/Rate Route Frequency Ordered Stop   06/30/21 1500  cefTRIAXone (ROCEPHIN) 1 g in sodium chloride 0.9 % 100 mL IVPB        1 g 200 mL/hr over 30 Minutes Intravenous Every 24 hours 06/29/21 1652     06/29/21 1545  cefTRIAXone (ROCEPHIN) 1 g in sodium chloride 0.9 % 100 mL IVPB        1 g 200 mL/hr over 30 Minutes Intravenous  Once 06/29/21 1544  06/29/21 1634              Family Communication/Anticipated D/C date and plan/Code Status   DVT prophylaxis: enoxaparin (LOVENOX) injection 40 mg Start: 06/29/21 2200     Code Status: Full Code  Family Communication: None Disposition Plan: Plan to discharge home after completion of IV antibiotics   Status is: Observation  The patient will require care spanning > 2 midnights and should be moved to inpatient because: IV antibiotics          Subjective:   C/o dysuira, generalized weakness  Objective:    Vitals:   06/30/21 1331 06/30/21 2031 07/01/21 0522 07/01/21 1235  BP: (!) 154/76 (!) 151/89 139/67 115/65  Pulse: 87 94 86 92  Resp: 17 18 18 17   Temp: 98.4 F (36.9 C) 98.6 F (37 C) 98.9 F (37.2 C) 98.1 F (36.7 C)  TempSrc: Oral Oral Oral   SpO2: 100% 97% 96% 98%  Weight:      Height:       No data found.   Intake/Output Summary (Last 24 hours) at 07/01/2021 1531 Last data filed at 07/01/2021 1300 Gross per 24 hour  Intake 800 ml  Output 750 ml  Net 50 ml  Filed Weights   06/29/21 1351  Weight: 72.6 kg    Exam:  GEN: NAD SKIN: No rash EYES: EOMI ENT: MMM, hearing impairment CV: RRR PULM: CTA B ABD: soft, ND, NT, +BS CNS: AAO x 3, non focal EXT: No edema or tenderness        Data Reviewed:   I have personally reviewed following labs and imaging studies:  Labs: Labs show the following:   Basic Metabolic Panel: Recent Labs  Lab 06/29/21 1442 06/30/21 0526 06/30/21 1155 07/01/21 0510  NA 134* 134* 134* 136  K 4.2 3.9 3.6 3.6  CL 96* 99 102 100  CO2 28 27 23 25   GLUCOSE 109* 110* 153* 103*  BUN 11 12 14 13   CREATININE 0.98 0.87 0.98 0.86  CALCIUM 8.8* 8.2* 8.0* 8.4*  MG  --  1.8  --  1.9  PHOS  --  2.7  --  2.6   GFR Estimated Creatinine Clearance: 46.7 mL/min (by C-G formula based on SCr of 0.86 mg/dL). Liver Function Tests: Recent Labs  Lab 06/29/21 1442 06/30/21 0526 07/01/21 0510  AST 27 27 28   ALT  14 12 13   ALKPHOS 59 49 44  BILITOT 1.1 0.8 0.7  PROT 7.0 6.2* 6.0*  ALBUMIN 4.0 3.4* 3.2*   No results for input(s): LIPASE, AMYLASE in the last 168 hours. No results for input(s): AMMONIA in the last 168 hours. Coagulation profile No results for input(s): INR, PROTIME in the last 168 hours.  CBC: Recent Labs  Lab 06/29/21 1442 06/30/21 0526 07/01/21 0510  WBC 10.1 8.3 5.9  NEUTROABS  --   --  4.0  HGB 12.8* 11.9* 11.3*  HCT 39.3 37.2* 35.7*  MCV 98.0 97.6 95.7  PLT 187 166 149*   Cardiac Enzymes: No results for input(s): CKTOTAL, CKMB, CKMBINDEX, TROPONINI in the last 168 hours. BNP (last 3 results) No results for input(s): PROBNP in the last 8760 hours. CBG: Recent Labs  Lab 06/29/21 1444  GLUCAP 108*   D-Dimer: No results for input(s): DDIMER in the last 72 hours. Hgb A1c: No results for input(s): HGBA1C in the last 72 hours. Lipid Profile: Recent Labs    07/01/21 0510  CHOL 100  HDL 48  LDLCALC 37  TRIG 74  CHOLHDL 2.1   Thyroid function studies: No results for input(s): TSH, T4TOTAL, T3FREE, THYROIDAB in the last 72 hours.  Invalid input(s): FREET3 Anemia work up: No results for input(s): VITAMINB12, FOLATE, FERRITIN, TIBC, IRON, RETICCTPCT in the last 72 hours. Sepsis Labs: Recent Labs  Lab 06/29/21 1442 06/30/21 0526 07/01/21 0510  WBC 10.1 8.3 5.9    Microbiology Recent Results (from the past 240 hour(s))  Urine Culture     Status: Abnormal (Preliminary result)   Collection Time: 06/29/21  2:55 PM   Specimen: Urine, Clean Catch  Result Value Ref Range Status   Specimen Description   Final    URINE, CLEAN CATCH Performed at [facility], [address] [place], [place] [address]    Special Requests   Final    NONE Performed at [facility], [address] [place], [place] [address]    Culture (A)  Final    >=100,000 COLONIES/mL ESCHERICHIA COLI SUSCEPTIBILITIES TO FOLLOW Performed at [facility], [address]  [place], [place] [address]    Report Status PENDING  Incomplete  Resp Panel by RT-PCR (Flu A&B, Covid) Nasopharyngeal Swab     Status: None   Collection Time: 06/29/21  3:56 PM  Specimen: Nasopharyngeal Swab; Nasopharyngeal(NP) swabs in vial transport medium  Result Value Ref Range Status   SARS Coronavirus 2 by RT PCR NEGATIVE NEGATIVE Final    Comment: (NOTE) SARS-CoV-2 target nucleic acids are NOT DETECTED.  The SARS-CoV-2 RNA is generally detectable in upper respiratory specimens during the acute phase of infection. The lowest concentration of SARS-CoV-2 viral copies this assay can detect is 138 copies/mL. A negative result does not preclude SARS-Cov-2 infection and should not be used as the sole basis for treatment or other patient management decisions. A negative result may occur with  improper specimen collection/handling, submission of specimen other than nasopharyngeal swab, presence of viral mutation(s) within the areas targeted by this assay, and inadequate number of viral copies(<138 copies/mL). A negative result must be combined with clinical observations, patient history, and epidemiological information. The expected result is Negative.  Fact Sheet for Patients:  BloggerCourse.com  Fact Sheet for Healthcare Providers:  SeriousBroker.it  This test is no t yet approved or cleared by the [place] FDA and  has been authorized for detection and/or diagnosis of SARS-CoV-2 by FDA under an Emergency Use Authorization (EUA). This EUA will remain  in effect (meaning this test can be used) for the duration of the COVID-19 declaration under Section 564(b)(1) of the Act, 21 U.S.C.section 360bbb-3(b)(1), unless the authorization is terminated  or revoked sooner.       Influenza A by PCR NEGATIVE NEGATIVE Final   Influenza B by PCR NEGATIVE NEGATIVE Final    Comment: (NOTE) The Xpert Xpress SARS-CoV-2/FLU/RSV plus assay is  intended as an aid in the diagnosis of influenza from Nasopharyngeal swab specimens and should not be used as a sole basis for treatment. Nasal washings and aspirates are unacceptable for Xpert Xpress SARS-CoV-2/FLU/RSV testing.  Fact Sheet for Patients: BloggerCourse.com  Fact Sheet for Healthcare Providers: SeriousBroker.it  This test is not yet approved or cleared by the [place] FDA and has been authorized for detection and/or diagnosis of SARS-CoV-2 by FDA under an Emergency Use Authorization (EUA). This EUA will remain in effect (meaning this test can be used) for the duration of the COVID-19 declaration under Section 564(b)(1) of the Act, 21 U.S.C. section 360bbb-3(b)(1), unless the authorization is terminated or revoked.  Performed at [facility], [address], [place], [place] [address]     Procedures and diagnostic studies:  No results found.             LOS: 0 days   Parry Po  [facility]   Pager on [email or web address]. If 7PM-7AM, please contact night-coverage at [email or web address]     07/01/2021, 3:31 PM

## 2021-07-01 NOTE — TOC Initial Note (Signed)
Transition of Care [facility]) - Initial/Assessment Note    Patient Details  Name: Paul Bradshaw MRN: [id number] Date of Birth: 12/19/1925  Transition of Care [facility]) CM/SW Contact:    Shade Flood, LCSW Phone Number: 07/01/2021, 11:47 AM  Clinical Narrative:                  Pt admitted from home. He lives with his wife and is independent in ADLs. PT/OT recommending HH at dc. Spoke with pt to assess and review dc planning. Pt reports that he intends to return home at dc. Reviewed PT/OT recommendations and pt is agreeable to [facility] at dc. CMS provider options reviewed and will refer as requested.  Per MD, anticipating dc in 1-2 days. TOC will follow and continue to assess and assist with dc planning.  Expected Discharge Plan: [place] Barriers to Discharge: Continued Medical Work up   Patient Goals and CMS Choice Patient states their goals for this hospitalization and ongoing recovery are:: go home CMS Medicare.gov Compare Post Acute Care list provided to:: Patient Choice offered to / list presented to : Patient  Expected Discharge Plan and Services Expected Discharge Plan: [place] In-house Referral: Clinical Social Work   Post Acute Care Choice: [place] arrangements for the past 2 months: [place]                                      Prior Living Arrangements/Services Living arrangements for the past 2 months: Single Family Home Lives with:: Spouse Patient language and need for interpreter reviewed:: Yes Do you feel safe going back to the place where you live?: Yes      Need for Family Participation in Patient Care: No (Comment) Care giver support system in place?: Yes (comment)   Criminal Activity/Legal Involvement Pertinent to Current Situation/Hospitalization: No - Comment as needed  Activities of Daily Living Home Assistive Devices/Equipment: None ADL Screening (condition at time of admission) Patient's  cognitive ability adequate to safely complete daily activities?: Yes Is the patient deaf or have difficulty hearing?: Yes Does the patient have difficulty seeing, even when wearing glasses/contacts?: No Does the patient have difficulty concentrating, remembering, or making decisions?: No Patient able to express need for assistance with ADLs?: Yes Does the patient have difficulty dressing or bathing?: No Independently performs ADLs?: Yes (appropriate for developmental age) Does the patient have difficulty walking or climbing stairs?: Yes Weakness of Legs: Both Weakness of Arms/Hands: Both  Permission Sought/Granted Permission sought to share information with : Facility Art therapist granted to share information with : Yes, Verbal Permission Granted     Permission granted to share info w AGENCY: HH        Emotional Assessment   Attitude/Demeanor/Rapport: Engaged Affect (typically observed): Pleasant Orientation: : Oriented to Self, Oriented to Place, Oriented to  Time, Oriented to Situation Alcohol / Substance Use: Not Applicable Psych Involvement: No (comment)  Admission diagnosis:  Weakness [R53.1] Acute cystitis with hematuria [N30.01] Generalized weakness [R53.1] Patient Active Problem List   Diagnosis Date Noted   Generalized weakness 06/29/2021   Sepsis due to Escherichia coli (E. coli) ([place]) 05/08/2021   E coli bacteremia 05/07/2021   Cystitis 05/06/2021   Sepsis ([place]) 05/06/2021   Hematuria 05/06/2021   Nephrolithiasis 05/06/2021   Sinus tachycardia 05/06/2021   Lactic acidosis 05/06/2021   Leukocytosis 05/06/2021  Hyperbilirubinemia 05/06/2021   Prediabetes 02/16/2021   White coat syndrome without hypertension    Risk for falls 05/27/2019   History of CVA (cerebrovascular accident) 10/23/2016   HNP (herniated nucleus pulposus), lumbar 01/23/2014   Lumbar stenosis 01/23/2014   BPH (benign prostatic hyperplasia) 10/14/2013   Mixed  hyperlipidemia 10/14/2013   Fever 08/08/2012   Carotid artery disease ([place]) 09/22/2011   PCP:  Loman Brooklyn, FNP Pharmacy:   [place], [place] [place] [address] Phone: [phone] Fax: [phone]  [facility] ([place], [place] [facility] [address] [place] [address] [place] [address] Phone: [phone] Fax: [phone]  [facility] ([facility] ) - [place], [address] [place] [address] Phone: [phone] Fax: [phone]     Social Determinants of Health (SDOH) Interventions    Readmission Risk Interventions No flowsheet data found.

## 2021-07-01 NOTE — Evaluation (Signed)
Physical Therapy Evaluation Patient Details Name: Paul Bradshaw MRN: [id number] DOB: [birth year] Today's Date: 07/01/2021  History of Present Illness  Paul Bradshaw is a 88 y.o. male with medical history significant for coronary artery disease, aortic regurgitation, hypertension, prediabetes.  Patient presented to the ED with complaints of generalized weakness, and pain with urination that he noticed today.  Reports similar symptoms requiring recent hospitalization.  He tells me he was so weak was unable to even turn in bed or get up.   Clinical Impression  Patient functioning near baseline for functional mobility and gait demonstrating fair/good return for ambulation in room, hallway requiring increased time for making turns without loss of balance and limited mostly due to c/o fatigue.  Patient tolerated sitting up in chair after therapy.  Patient will benefit from continued skilled physical therapy in hospital and recommended venue below to increase strength, balance, endurance for safe ADLs and gait.          Recommendations for follow up therapy are one component of a multi-disciplinary discharge planning process, led by the attending physician.  Recommendations may be updated based on patient status, additional functional criteria and insurance authorization.  Follow Up Recommendations Home health PT    Assistance Recommended at Discharge Set up Supervision/Assistance  Patient can return home with the following  A little help with walking and/or transfers;A little help with bathing/dressing/bathroom    Equipment Recommendations None recommended by PT  Recommendations for Other Services       Functional Status Assessment Patient has had a recent decline in their functional status and demonstrates the ability to make significant improvements in function in a reasonable and predictable amount of time.     Precautions / Restrictions Precautions Precautions: Fall Restrictions Weight  Bearing Restrictions: No      Mobility  Bed Mobility Overal bed mobility: Needs Assistance Bed Mobility: Supine to Sit     Supine to sit: Supervision     General bed mobility comments: as per OT notes    Transfers Overall transfer level: Needs assistance Equipment used: Rolling walker (2 wheels) Transfers: Sit to/from Stand;Bed to chair/wheelchair/BSC Sit to Stand: Supervision Stand pivot transfers: Supervision         General transfer comment: as per OT notes    Ambulation/Gait Ambulation/Gait assistance: Supervision;Min guard Gait Distance (Feet): 50 Feet Assistive device: Rolling walker (2 wheels) Gait Pattern/deviations: Decreased step length - left;Decreased stance time - right;Decreased stride length;Trunk flexed Gait velocity: decreased     General Gait Details: slightly labored cadence with increased time for making turns, no loss of balance, limited mostly due to fatigue  Stairs            Wheelchair Mobility    Modified Rankin (Stroke Patients Only)       Balance Overall balance assessment: Needs assistance Sitting-balance support: Feet supported;No upper extremity supported Sitting balance-Leahy Scale: Good Sitting balance - Comments: seated at EOB   Standing balance support: Reliant on assistive device for balance;During functional activity;Bilateral upper extremity supported Standing balance-Leahy Scale: Fair Standing balance comment: fair/good using RW                             Pertinent Vitals/Pain Pain Assessment: Faces Faces Pain Scale: Hurts little more Pain Location: low back Pain Descriptors / Indicators: Grimacing Pain Intervention(s): Limited activity within patient's tolerance;Monitored during session;Repositioned    Home Living Family/patient expects to be discharged to:: Private residence Living  Arrangements: Spouse/significant other Available Help at Discharge: Family;Available PRN/intermittently Type  of Home: House Home Access: Stairs to enter;Ramped entrance Entrance Stairs-Rails: Right Entrance Stairs-Number of Steps: 6   Home Layout: One level Home Equipment: Cane - single point;Shower Counsellor (2 wheels) Additional Comments: pt reports he assists spouse who has a hospital bed, w/c and RW, 2 step daughters (nurses) are able to assist as needed however they both work a lot    Prior Function Prior Level of Function : Independent/Modified Independent             Mobility Comments: Household and short distanced community ambulator using RW PRN ADLs Comments: ind with ADLs/IADLs, cares for disabled spouse     Hand Dominance   Dominant Hand: Right    Extremity/Trunk Assessment   Upper Extremity Assessment Upper Extremity Assessment: Defer to OT evaluation    Lower Extremity Assessment Lower Extremity Assessment: Generalized weakness    Cervical / Trunk Assessment Cervical / Trunk Assessment: Kyphotic  Communication   Communication: HOH  Cognition Arousal/Alertness: Awake/alert Behavior During Therapy: WFL for tasks assessed/performed Overall Cognitive Status: Within Functional Limits for tasks assessed                                          General Comments      Exercises     Assessment/Plan    PT Assessment Patient needs continued PT services  PT Problem List Decreased strength;Decreased activity tolerance;Decreased balance;Decreased mobility       PT Treatment Interventions DME instruction;Gait training;Stair training;Functional mobility training;Therapeutic activities;Patient/family education;Balance training    PT Goals (Current goals can be found in the Care Plan section)  Acute Rehab PT Goals Patient Stated Goal: return home with family to assist PT Goal Formulation: With patient Time For Goal Achievement: 07/04/21 Potential to Achieve Goals: Good    Frequency Min 3X/week     Co-evaluation PT/OT/SLP  Co-Evaluation/Treatment: Yes Reason for Co-Treatment: Complexity of the patient's impairments (multi-system involvement);To address functional/ADL transfers PT goals addressed during session: Mobility/safety with mobility;Proper use of DME;Balance OT goals addressed during session: ADL's and self-care;Proper use of Adaptive equipment and DME       AM-PAC PT "6 Clicks" Mobility  Outcome Measure Help needed turning from your back to your side while in a flat bed without using bedrails?: None Help needed moving from lying on your back to sitting on the side of a flat bed without using bedrails?: None Help needed moving to and from a bed to a chair (including a wheelchair)?: A Little Help needed standing up from a chair using your arms (e.g., wheelchair or bedside chair)?: A Little Help needed to walk in hospital room?: A Little Help needed climbing 3-5 steps with a railing? : A Lot 6 Click Score: 19    End of Session   Activity Tolerance: Patient tolerated treatment well;Patient limited by fatigue Patient left: in chair;with call bell/phone within reach Nurse Communication: Mobility status PT Visit Diagnosis: Unsteadiness on feet (R26.81);Other abnormalities of gait and mobility (R26.89);Muscle weakness (generalized) (M62.81)    Time: 2831-5176 PT Time Calculation (min) (ACUTE ONLY): 30 min   Charges:   PT Evaluation $PT Eval Moderate Complexity: 1 Mod PT Treatments $Therapeutic Activity: 23-37 mins        11:36 AM, 07/01/21 Ocie Bob, MPT Physical Therapist with [facility] 336 [phone] office [phone] mobile phone

## 2021-07-01 NOTE — Plan of Care (Signed)
°  Problem: Acute Rehab PT Goals(only PT should resolve) Goal: Pt Will Go Supine/Side To Sit Outcome: Progressing Flowsheets (Taken 07/01/2021 1137) Pt will go Supine/Side to Sit: with modified independence Goal: Patient Will Transfer Sit To/From Stand Outcome: Progressing Flowsheets (Taken 07/01/2021 1137) Patient will transfer sit to/from stand: with modified independence Goal: Pt Will Transfer Bed To Chair/Chair To Bed Outcome: Progressing Flowsheets (Taken 07/01/2021 1137) Pt will Transfer Bed to Chair/Chair to Bed:  with supervision  with modified independence Goal: Pt Will Ambulate Outcome: Progressing Flowsheets (Taken 07/01/2021 1137) Pt will Ambulate:  75 feet  with supervision  with rolling walker  with modified independence   11:37 AM, 07/01/21 Ocie Bob, MPT Physical Therapist with [facility] 336 [phone] office [phone] mobile phone

## 2021-07-01 NOTE — Evaluation (Signed)
Occupational Therapy Evaluation Patient Details Name: Paul Bradshaw MRN: [id number] DOB: [birth year] Today's Date: 07/01/2021   History of Present Illness Patient presented to the ED with complaints of generalized weakness, and pain with urination that he noticed today.  Reports similar symptoms requiring recent hospitalization. Pt admitted for UTI   Clinical Impression   Pt agreeable to OT/PT co-evaluation this am. Pt reports he was unable to get up previously, demonstrates mobility at supervision/min guard level during evaluation. Pt performing ADLs with supervision, does endorse back pain and needing to sit down after approximately 5 minutes of functional mobility. Suspect pt is close to his baseline, recommend [facility] OT services on discharge to assess functioning in home environment and improve safety and independence during ADL completion.       Recommendations for follow up therapy are one component of a multi-disciplinary discharge planning process, led by the attending physician.  Recommendations may be updated based on patient status, additional functional criteria and insurance authorization.   Follow Up Recommendations  Home health OT    Assistance Recommended at Discharge Intermittent Supervision/Assistance  Patient can return home with the following A little help with walking and/or transfers    Functional Status Assessment  Patient has had a recent decline in their functional status and demonstrates the ability to make significant improvements in function in a reasonable and predictable amount of time.  Equipment Recommendations  None recommended by OT       Precautions / Restrictions Precautions Precautions: Fall Restrictions Weight Bearing Restrictions: No      Mobility Bed Mobility Overal bed mobility: Needs Assistance Bed Mobility: Supine to Sit     Supine to sit: Supervision     General bed mobility comments: Increased time required    Transfers Overall  transfer level: Needs assistance Equipment used: Rolling walker (2 wheels) Transfers: Sit to/from Stand;Bed to chair/wheelchair/BSC Sit to Stand: Supervision Stand pivot transfers: Supervision         General transfer comment: cuing for hand placement and to push off versus pulling up on RW          ADL either performed or assessed with clinical judgement   ADL Overall ADL's : Needs assistance/impaired Eating/Feeding: Modified independent;Sitting   Grooming: Set up;Sitting Grooming Details (indicate cue type and reason): limited standing tolerance due to back pain             Lower Body Dressing: Supervision/safety;Sitting/lateral leans Lower Body Dressing Details (indicate cue type and reason): pt donning socks while seated at EOB Toilet Transfer: Supervision/safety;Ambulation;Regular Teacher, adult education Details (indicate cue type and reason): simulated with functional mobility in room Toileting- Clothing Manipulation and Hygiene: Supervision/safety;Sit to/from stand       Functional mobility during ADLs: Supervision/safety;Min guard;Rolling walker (2 wheels)       Vision Baseline Vision/History: 1 Wears glasses Ability to See in Adequate Light: 0 Adequate Patient Visual Report: No change from baseline Vision Assessment?: No apparent visual deficits            Pertinent Vitals/Pain Pain Assessment: Faces Faces Pain Scale: Hurts little more Pain Location: low back Pain Descriptors / Indicators: Grimacing Pain Intervention(s): Limited activity within patient's tolerance;Monitored during session;Repositioned     Hand Dominance Right   Extremity/Trunk Assessment Upper Extremity Assessment Upper Extremity Assessment: Generalized weakness   Lower Extremity Assessment Lower Extremity Assessment: Defer to PT evaluation   Cervical / Trunk Assessment Cervical / Trunk Assessment: Kyphotic   Communication Communication Communication: HOH   Cognition  Arousal/Alertness:  Awake/alert Behavior During Therapy: WFL for tasks assessed/performed Overall Cognitive Status: Within Functional Limits for tasks assessed                                                  Home Living Family/patient expects to be discharged to:: Private residence Living Arrangements: Spouse/significant other Available Help at Discharge: Family;Available PRN/intermittently Type of Home: House Home Access: Stairs to enter;Ramped entrance Entrance Stairs-Number of Steps: 6 Entrance Stairs-Rails: Right Home Layout: One level     Bathroom Shower/Tub: Chief Strategy Officer: Standard     Home Equipment: Cane - single point;Shower seat   Additional Comments: pt reports he assists spouse who has a hospital bed, w/c and RW, 2 step daughters (nurses) are able to assist as needed however they both work a lot      Prior Functioning/Environment Prior Level of Function : Independent/Modified Independent               ADLs Comments: ind with ADLs/IADLs, cares for disabled spouse        OT Problem List: Decreased strength;Decreased activity tolerance;Impaired balance (sitting and/or standing);Decreased safety awareness;Decreased knowledge of use of DME or AE      OT Treatment/Interventions: Self-care/ADL training;Therapeutic exercise;DME and/or AE instruction;Therapeutic activities;Patient/family education    OT Goals(Current goals can be found in the care plan section) Acute Rehab OT Goals Patient Stated Goal: To improve his UTI and go home OT Goal Formulation: With patient Time For Goal Achievement: 07/15/21 Potential to Achieve Goals: Good  OT Frequency: Min 1X/week    Co-evaluation PT/OT/SLP Co-Evaluation/Treatment: Yes Reason for Co-Treatment: Complexity of the patient's impairments (multi-system involvement)   OT goals addressed during session: ADL's and self-care;Proper use of Adaptive equipment and DME      AM-PAC  OT "6 Clicks" Daily Activity     Outcome Measure Help from another person eating meals?: None Help from another person taking care of personal grooming?: A Little Help from another person toileting, which includes using toliet, bedpan, or urinal?: A Little Help from another person bathing (including washing, rinsing, drying)?: A Little Help from another person to put on and taking off regular upper body clothing?: A Little Help from another person to put on and taking off regular lower body clothing?: A Little 6 Click Score: 19   End of Session Equipment Utilized During Treatment: Rolling walker (2 wheels) Nurse Communication: Mobility status  Activity Tolerance: Patient tolerated treatment well Patient left: in chair;with call bell/phone within reach  OT Visit Diagnosis: Muscle weakness (generalized) (M62.81)                Time: 1443-1540 OT Time Calculation (min): 18 min Charges:  OT General Charges $OT Visit: 1 Visit OT Evaluation $OT Eval Low Complexity: 1 Low  Ezra Sites, OTR/L  [phone] 07/01/2021, 9:50 AM

## 2021-07-02 DIAGNOSIS — R531 Weakness: Secondary | ICD-10-CM | POA: Diagnosis not present

## 2021-07-02 DIAGNOSIS — N3090 Cystitis, unspecified without hematuria: Secondary | ICD-10-CM | POA: Diagnosis not present

## 2021-07-02 LAB — MAGNESIUM: Magnesium: 1.9 mg/dL (ref 1.7–2.4)

## 2021-07-02 LAB — COMPREHENSIVE METABOLIC PANEL
ALT: 17 U/L (ref 0–44)
AST: 32 U/L (ref 15–41)
Albumin: 3.1 g/dL — ABNORMAL LOW (ref 3.5–5.0)
Alkaline Phosphatase: 45 U/L (ref 38–126)
Anion gap: 7 (ref 5–15)
BUN: 14 mg/dL (ref 8–23)
CO2: 28 mmol/L (ref 22–32)
Calcium: 8.4 mg/dL — ABNORMAL LOW (ref 8.9–10.3)
Chloride: 102 mmol/L (ref 98–111)
Creatinine, Ser: 0.78 mg/dL (ref 0.61–1.24)
GFR, Estimated: >60 mL/min (ref >60)
Glucose, Bld: 97 mg/dL (ref 70–99)
Potassium: 3.9 mmol/L (ref 3.5–5.1)
Sodium: 137 mmol/L (ref 135–145)
Total Bilirubin: 0.4 mg/dL (ref 0.3–1.2)
Total Protein: 5.9 g/dL — ABNORMAL LOW (ref 6.5–8.1)

## 2021-07-02 LAB — URINE CULTURE: Culture: >=100000 — AB

## 2021-07-02 LAB — CBC WITH DIFFERENTIAL/PLATELET
Abs Immature Granulocytes: 0.02 10*3/uL (ref 0.00–0.07)
Basophils Absolute: 0 10*3/uL (ref 0.0–0.1)
Basophils Relative: 0 %
Eosinophils Absolute: 0.1 10*3/uL (ref 0.0–0.5)
Eosinophils Relative: 2 %
HCT: 34.4 % — ABNORMAL LOW (ref 39.0–52.0)
Hemoglobin: 10.8 g/dL — ABNORMAL LOW (ref 13.0–17.0)
Immature Granulocytes: 0 %
Lymphocytes Relative: 25 %
Lymphs Abs: 1.4 10*3/uL (ref 0.7–4.0)
MCH: 30 pg (ref 26.0–34.0)
MCHC: 31.4 g/dL (ref 30.0–36.0)
MCV: 95.6 fL (ref 80.0–100.0)
Monocytes Absolute: 0.8 10*3/uL (ref 0.1–1.0)
Monocytes Relative: 15 %
Neutro Abs: 3.2 10*3/uL (ref 1.7–7.7)
Neutrophils Relative %: 58 %
Platelets: 160 10*3/uL (ref 150–400)
RBC: 3.6 MIL/uL — ABNORMAL LOW (ref 4.22–5.81)
RDW: 12.1 % (ref 11.5–15.5)
WBC: 5.5 10*3/uL (ref 4.0–10.5)
nRBC: 0 % (ref 0.0–0.2)

## 2021-07-02 LAB — PHOSPHORUS: Phosphorus: 3.4 mg/dL (ref 2.5–4.6)

## 2021-07-02 MED ORDER — SULFAMETHOXAZOLE-TRIMETHOPRIM 800-160 MG PO TABS: 1.0000 | ORAL_TABLET | Freq: Two times a day (BID) | ORAL | 0 refills | Status: AC

## 2021-07-02 NOTE — TOC Transition Note (Signed)
Transition of Care [facility]) - CM/SW Discharge Note   Patient Details  Name: ZABDI KASAL MRN: [id number] Date of Birth: 03/10/1926  Transition of Care [facility]) CM/SW Contact:  Shade Flood, LCSW Phone Number: 07/02/2021, 12:39 PM   Clinical Narrative:     Pt stable for dc home with [facility] today per MD. During initial TOC assessment pt had stated he was agreeable to [facility] at dc with any agency that would take his insurance. Referred to [employer] and pt is accepted. [facility] RN will see pt tomorrow. Updated RN and added to AVS.  There are no other TOC needs for dc.  Final next level of care: [place] Barriers to Discharge: Barriers Resolved   Patient Goals and CMS Choice Patient states their goals for this hospitalization and ongoing recovery are:: go home CMS Medicare.gov Compare Post Acute Care list provided to:: Patient Choice offered to / list presented to : Patient  Discharge Placement                       Discharge Plan and Services In-house Referral: Clinical Social Work   Post Acute Care Choice: Home Health                    HH Arranged: RN, PT [facility] Agency: [place] Date [place]: 07/02/21   Representative spoke with at [place]: Judson Roch  Social Determinants of Health ([place]) Interventions     Readmission Risk Interventions No flowsheet data found.

## 2021-07-02 NOTE — Discharge Summary (Signed)
Physician Discharge Summary  Paul Bradshaw DVV:[id number] DOB: 09/01/1925 DOA: 06/29/2021  PCP: Gwenlyn Fudge, FNP  Admit date: 06/29/2021 Discharge date: 07/02/2021  Discharge disposition: Home with home health therapy   Recommendations for Outpatient Follow-Up:   Follow-up with PCP in 1 week   Discharge Diagnosis:   Principal Problem:   Generalized weakness Active Problems:   BPH (benign prostatic hyperplasia)   History of CVA (cerebrovascular accident)   Cystitis    Discharge Condition: Stable.  Diet recommendation:  Diet Order             Diet - low sodium heart healthy           Diet regular Room service appropriate? Yes; Fluid consistency: Thin  Diet effective now                     Code Status: Full Code     Hospital Course:   [name] is a 88 y.o. male with medical history significant for stroke, CAD, aortic regurgitation, hypertension, prediabetes, BPH, who presented to the hospital with generalized weakness and dysuria.  Recently hospitalized from 05/06/2021 through 05/09/2021 for severe sepsis secondary to acute UTI and E. coli bacteremia.   He was treated with empiric IV Rocephin and IV fluids.  Urine culture showed pansensitive E. coli.  His condition has improved significantly and he is deemed stable for discharge to home today.  PT recommended home health therapy.     Discharge Exam:    Vitals:   07/01/21 0522 07/01/21 1235 07/01/21 2043 07/02/21 0612  BP: 139/67 115/65 (!) 154/74 131/65  Pulse: 86 92 78 74  Resp: 18 17 17    Temp: 98.9 F (37.2 C) 98.1 F (36.7 C) 98.4 F (36.9 C) 98.9 F (37.2 C)  TempSrc: Oral  Oral Oral  SpO2: 96% 98% 98% 94%  Weight:      Height:         GEN: NAD SKIN: Warm and dry EYES: No pallor or icterus ENT: MMM, hearing impairment CV: RRR PULM: CTA B ABD: soft, ND, NT, +BS CNS: AAO x 3, non focal EXT: No edema or tenderness   The results of significant diagnostics from this  hospitalization (including imaging, microbiology, ancillary and laboratory) are listed below for reference.     Procedures and Diagnostic Studies:   DG Chest 2 View  Result Date: 06/29/2021 CLINICAL DATA:  complaints of possible UTI and generalized weakness. States that he is having dysuria with dark, foul smell to urine. EXAM: CHEST - 2 VIEW COMPARISON:  05/06/2021. FINDINGS: Cardiac silhouette normal in size. Moderate hiatal hernia. No mediastinal or hilar masses or evidence of adenopathy. Clear lungs.  No pleural effusion or pneumothorax. Skeletal structures are intact. IMPRESSION: No active cardiopulmonary disease. Electronically Signed   By: 05/08/2021 M.D.   On: 06/29/2021 15:11     Labs:   Basic Metabolic Panel: Recent Labs  Lab 06/29/21 1442 06/30/21 0526 06/30/21 1155 07/01/21 0510 07/02/21 0531  NA 134* 134* 134* 136 137  K 4.2 3.9 3.6 3.6 3.9  CL 96* 99 102 100 102  CO2 28 27 23 25 28   GLUCOSE 109* 110* 153* 103* 97  BUN 11 12 14 13 14   CREATININE 0.98 0.87 0.98 0.86 0.78  CALCIUM 8.8* 8.2* 8.0* 8.4* 8.4*  MG  --  1.8  --  1.9 1.9  PHOS  --  2.7  --  2.6 3.4   GFR Estimated Creatinine  Clearance: 50.2 mL/min (by C-G formula based on SCr of 0.78 mg/dL). Liver Function Tests: Recent Labs  Lab 06/29/21 1442 06/30/21 0526 07/01/21 0510 07/02/21 0531  AST 27 27 28  32  ALT 14 12 13 17   ALKPHOS 59 49 44 45  BILITOT 1.1 0.8 0.7 0.4  PROT 7.0 6.2* 6.0* 5.9*  ALBUMIN 4.0 3.4* 3.2* 3.1*   No results for input(s): LIPASE, AMYLASE in the last 168 hours. No results for input(s): AMMONIA in the last 168 hours. Coagulation profile No results for input(s): INR, PROTIME in the last 168 hours.  CBC: Recent Labs  Lab 06/29/21 1442 06/30/21 0526 07/01/21 0510 07/02/21 0531  WBC 10.1 8.3 5.9 5.5  NEUTROABS  --   --  4.0 3.2  HGB 12.8* 11.9* 11.3* 10.8*  HCT 39.3 37.2* 35.7* 34.4*  MCV 98.0 97.6 95.7 95.6  PLT 187 166 149* 160   Cardiac Enzymes: No results for  input(s): CKTOTAL, CKMB, CKMBINDEX, TROPONINI in the last 168 hours. BNP: Invalid input(s): POCBNP CBG: Recent Labs  Lab 06/29/21 1444  GLUCAP 108*   D-Dimer No results for input(s): DDIMER in the last 72 hours. Hgb A1c No results for input(s): HGBA1C in the last 72 hours. Lipid Profile Recent Labs    07/01/21 0510  CHOL 100  HDL 48  LDLCALC 37  TRIG 74  CHOLHDL 2.1   Thyroid function studies No results for input(s): TSH, T4TOTAL, T3FREE, THYROIDAB in the last 72 hours.  Invalid input(s): FREET3 Anemia work up No results for input(s): VITAMINB12, FOLATE, FERRITIN, TIBC, IRON, RETICCTPCT in the last 72 hours. Microbiology Recent Results (from the past 240 hour(s))  Urine Culture     Status: Abnormal   Collection Time: 06/29/21  2:55 PM   Specimen: Urine, Clean Catch  Result Value Ref Range Status   Specimen Description   Final    URINE, CLEAN CATCH Performed at [facility], [address] [place], [place] [address]    Special Requests   Final    NONE Performed at [facility], [address] [place], [place] [address]    Culture >=100,000 COLONIES/mL ESCHERICHIA COLI (A)  Final   Report Status 07/02/2021 FINAL  Final   Organism ID, Bacteria ESCHERICHIA COLI (A)  Final      Susceptibility   Escherichia coli - MIC*    AMPICILLIN <=2 SENSITIVE Sensitive     CEFAZOLIN <=4 SENSITIVE Sensitive     CEFEPIME <=0.12 SENSITIVE Sensitive     CEFTRIAXONE <=0.25 SENSITIVE Sensitive     CIPROFLOXACIN <=0.25 SENSITIVE Sensitive     GENTAMICIN <=1 SENSITIVE Sensitive     IMIPENEM <=0.25 SENSITIVE Sensitive     NITROFURANTOIN <=16 SENSITIVE Sensitive     TRIMETH/SULFA <=20 SENSITIVE Sensitive     AMPICILLIN/SULBACTAM <=2 SENSITIVE Sensitive     PIP/TAZO <=4 SENSITIVE Sensitive     * >=100,000 COLONIES/mL ESCHERICHIA COLI  Resp Panel by RT-PCR (Flu A&B, Covid) Nasopharyngeal Swab     Status: None   Collection Time: 06/29/21  3:56 PM   Specimen: Nasopharyngeal Swab;  Nasopharyngeal(NP) swabs in vial transport medium  Result Value Ref Range Status   SARS Coronavirus 2 by RT PCR NEGATIVE NEGATIVE Final    Comment: (NOTE) SARS-CoV-2 target nucleic acids are NOT DETECTED.  The SARS-CoV-2 RNA is generally detectable in upper respiratory specimens during the acute phase of infection. The lowest concentration of SARS-CoV-2 viral copies this assay can detect is 138 copies/mL. A negative result does not preclude SARS-Cov-2 infection and  should not be used as the sole basis for treatment or other patient management decisions. A negative result may occur with  improper specimen collection/handling, submission of specimen other than nasopharyngeal swab, presence of viral mutation(s) within the areas targeted by this assay, and inadequate number of viral copies(<138 copies/mL). A negative result must be combined with clinical observations, patient history, and epidemiological information. The expected result is Negative.  Fact Sheet for Patients:  BloggerCourse.comhttps://www.fda.gov/media/152166/download  Fact Sheet for Healthcare Providers:  SeriousBroker.ithttps://www.fda.gov/media/152162/download  This test is no t yet approved or cleared by the [place] FDA and  has been authorized for detection and/or diagnosis of SARS-CoV-2 by FDA under an Emergency Use Authorization (EUA). This EUA will remain  in effect (meaning this test can be used) for the duration of the COVID-19 declaration under Section 564(b)(1) of the Act, 21 U.S.C.section 360bbb-3(b)(1), unless the authorization is terminated  or revoked sooner.       Influenza A by PCR NEGATIVE NEGATIVE Final   Influenza B by PCR NEGATIVE NEGATIVE Final    Comment: (NOTE) The Xpert Xpress SARS-CoV-2/FLU/RSV plus assay is intended as an aid in the diagnosis of influenza from Nasopharyngeal swab specimens and should not be used as a sole basis for treatment. Nasal washings and aspirates are unacceptable for Xpert Xpress  SARS-CoV-2/FLU/RSV testing.  Fact Sheet for Patients: BloggerCourse.comhttps://www.fda.gov/media/152166/download  Fact Sheet for Healthcare Providers: SeriousBroker.ithttps://www.fda.gov/media/152162/download  This test is not yet approved or cleared by the [place] FDA and has been authorized for detection and/or diagnosis of SARS-CoV-2 by FDA under an Emergency Use Authorization (EUA). This EUA will remain in effect (meaning this test can be used) for the duration of the COVID-19 declaration under Section 564(b)(1) of the Act, 21 U.S.C. section 360bbb-3(b)(1), unless the authorization is terminated or revoked.  Performed at [facility], [address] [place], [place] [address]      Discharge Instructions:   Discharge Instructions     Diet - low sodium heart healthy   Complete by: As directed    Face-to-face encounter (required for Medicare/Medicaid patients)   Complete by: As directed    I Temple Sporer certify that this patient is under my care and that I, or a nurse practitioner or physician's assistant working with me, had a face-to-face encounter that meets the physician face-to-face encounter requirements with this patient on 07/02/2021. The encounter with the patient was in whole, or in part for the following medical condition(s) which is the primary reason for home health care (List medical condition): Generalized weakness   The encounter with the patient was in whole, or in part, for the following medical condition, which is the primary reason for home health care: Generalized weakness   I certify that, based on my findings, the following services are medically necessary home health services: Physical therapy   Reason for Medically Necessary Home Health Services: Therapy- Investment banker, operationalGait Training, Teacher, early years/preTransfer Training and Stair Training   My clinical findings support the need for the above services: Unsafe ambulation due to balance issues   Further, I certify that my clinical findings support that this patient  is homebound due to: Unsafe ambulation due to balance issues   Home Health   Complete by: As directed    To provide the following care/treatments: PT   Increase activity slowly   Complete by: As directed       Allergies as of 07/02/2021       Reactions   Amlodipine Swelling   Swelling of lips.  Lisinopril Other (See Comments)   Elevated BP higher & causes a burning feeling on the inside.         Medication List     TAKE these medications    acetaminophen 500 MG tablet Commonly known as: TYLENOL Take 1,000 mg by mouth every 8 (eight) hours as needed for moderate pain.   atorvastatin 10 MG tablet Commonly known as: LIPITOR TAKE ONE-HALF TABLET BY  MOUTH DAILY AT 6 PM What changed: See the new instructions.   cetirizine 10 MG tablet Commonly known as: ZYRTEC Take 10 mg by mouth daily.   clopidogrel 75 MG tablet Commonly known as: PLAVIX TAKE 1 TABLET BY MOUTH  DAILY WITH BREAKFAST   MULTIVITAMIN PO Take 1 tablet by mouth daily.   sulfamethoxazole-trimethoprim 800-160 MG tablet Commonly known as: BACTRIM DS Take 1 tablet by mouth 2 (two) times daily for 7 days.   tamsulosin 0.4 MG Caps capsule Commonly known as: FLOMAX TAKE 1 CAPSULE BY MOUTH IN  THE EVENING What changed: See the new instructions.   Turmeric 1053 MG Tabs Take 1 tablet by mouth daily.           If you experience worsening of your admission symptoms, develop shortness of breath, life threatening emergency, suicidal or homicidal thoughts you must seek medical attention immediately by calling 911 or calling your MD immediately  if symptoms less severe.   You must read complete instructions/literature along with all the possible adverse reactions/side effects for all the medicines you take and that have been prescribed to you. Take any new medicines after you have completely understood and accept all the possible adverse reactions/side effects.    Please note   You were cared for by a  hospitalist during your hospital stay. If you have any questions about your discharge medications or the care you received while you were in the hospital after you are discharged, you can call the unit and asked to speak with the hospitalist on call if the hospitalist that took care of you is not available. Once you are discharged, your primary care physician will handle any further medical issues. Please note that NO REFILLS for any discharge medications will be authorized once you are discharged, as it is imperative that you return to your primary care physician (or establish a relationship with a primary care physician if you do not have one) for your aftercare needs so that they can reassess your need for medications and monitor your lab values.       Time coordinating discharge: 31 minutes  Signed:  Cortland Crehan  [facility] 07/02/2021, 11:09 AM   Pager on [email or web address]. If 7PM-7AM, please contact night-coverage at [email or web address]

## 2021-07-02 NOTE — Progress Notes (Signed)
Patient discharged home with home health today transported by family home. Discharge paperwork went over with patient, patient verbalized understanding. Belongings sent home with patient.

## 2021-07-03 DIAGNOSIS — E782 Mixed hyperlipidemia: Secondary | ICD-10-CM | POA: Diagnosis not present

## 2021-07-03 DIAGNOSIS — I10 Essential (primary) hypertension: Secondary | ICD-10-CM | POA: Diagnosis not present

## 2021-07-03 DIAGNOSIS — I2510 Atherosclerotic heart disease of native coronary artery without angina pectoris: Secondary | ICD-10-CM | POA: Diagnosis not present

## 2021-07-03 DIAGNOSIS — N390 Urinary tract infection, site not specified: Secondary | ICD-10-CM | POA: Diagnosis not present

## 2021-07-04 ENCOUNTER — Telehealth: Payer: Self-pay | Admitting: Family Medicine

## 2021-07-04 DIAGNOSIS — E782 Mixed hyperlipidemia: Secondary | ICD-10-CM | POA: Diagnosis not present

## 2021-07-04 DIAGNOSIS — I10 Essential (primary) hypertension: Secondary | ICD-10-CM | POA: Diagnosis not present

## 2021-07-04 DIAGNOSIS — I2510 Atherosclerotic heart disease of native coronary artery without angina pectoris: Secondary | ICD-10-CM | POA: Diagnosis not present

## 2021-07-04 DIAGNOSIS — N390 Urinary tract infection, site not specified: Secondary | ICD-10-CM | POA: Diagnosis not present

## 2021-07-04 NOTE — Telephone Encounter (Signed)
Sounds good

## 2021-07-04 NOTE — Telephone Encounter (Signed)
Katie called to let PCP know that pt is home from the hospital and is going to have [place] 1x per wk for 1 wk and 2x per wk for 2 wks.

## 2021-07-09 ENCOUNTER — Encounter: Payer: Self-pay | Admitting: Family Medicine

## 2021-07-09 ENCOUNTER — Ambulatory Visit (INDEPENDENT_AMBULATORY_CARE_PROVIDER_SITE_OTHER): Payer: Medicare Other | Admitting: Family Medicine

## 2021-07-09 VITALS — BP 137/75 | HR 110 | Temp 97.0°F | Ht 65.0 in | Wt 160.0 lb

## 2021-07-09 DIAGNOSIS — I2510 Atherosclerotic heart disease of native coronary artery without angina pectoris: Secondary | ICD-10-CM | POA: Diagnosis not present

## 2021-07-09 DIAGNOSIS — R350 Frequency of micturition: Secondary | ICD-10-CM | POA: Diagnosis not present

## 2021-07-09 DIAGNOSIS — N3001 Acute cystitis with hematuria: Secondary | ICD-10-CM

## 2021-07-09 DIAGNOSIS — N390 Urinary tract infection, site not specified: Secondary | ICD-10-CM | POA: Diagnosis not present

## 2021-07-09 DIAGNOSIS — I10 Essential (primary) hypertension: Secondary | ICD-10-CM | POA: Diagnosis not present

## 2021-07-09 DIAGNOSIS — E782 Mixed hyperlipidemia: Secondary | ICD-10-CM | POA: Diagnosis not present

## 2021-07-09 LAB — URINALYSIS, ROUTINE W REFLEX MICROSCOPIC
Bilirubin, UA: NEGATIVE
Glucose, UA: NEGATIVE
Ketones, UA: NEGATIVE
Leukocytes,UA: NEGATIVE
Nitrite, UA: NEGATIVE
Protein,UA: NEGATIVE
RBC, UA: NEGATIVE
Specific Gravity, UA: 1.02 (ref 1.005–1.030)
Urobilinogen, Ur: 0.2 mg/dL (ref 0.2–1.0)
pH, UA: 6 (ref 5.0–7.5)

## 2021-07-09 NOTE — Progress Notes (Signed)
Assessment & Plan:  1. Acute cystitis with hematuria Complete course of antibiotics. Education provided on UTIs. Encouraged adequate hydration. - Urinalysis, Routine w reflex microscopic - CBC with Differential/Platelet  2. Recurrent UTI - Ambulatory referral to Urology   Return if symptoms worsen or fail to improve.  Hendricks Limes, MSN, APRN, FNP-C [facility]  Subjective:    Patient ID: Paul Bradshaw, male    DOB: 1926-02-12, 88 y.o.   MRN: [id number]  Patient Care Team: Loman Brooklyn, FNP as PCP - General (Family Medicine)   Chief Complaint:  Chief Complaint  Patient presents with   Hospitalization Follow-up    AP 1/7- weakness     HPI: Paul Bradshaw is a 88 y.o. male presenting on 07/09/2021 for Hospitalization Follow-up (AP 1/7- weakness )  Patient was hospitalized at [facility] 06/29/2021-07/02/2021 due to a UTI. He was treated with IV Rocephin and IV fluids. He improved significantly and was discharged home with home health PT, who is suppose to come this week. He was discharged home with Bactrim, which he is still taking. He has never seen urology in the past. This is the second time he has been hospitalized for UTI in the past two months. Patient states he doesn't drink much because he doesn't like to pee a lot.  New complaints: None   Social history:  Relevant past medical, surgical, family and social history reviewed and updated as indicated. Interim medical history since our last visit reviewed.  Allergies and medications reviewed and updated.  DATA REVIEWED: CHART IN EPIC  ROS: Negative unless specifically indicated above in HPI.    Current Outpatient Medications:    acetaminophen (TYLENOL) 500 MG tablet, Take 1,000 mg by mouth every 8 (eight) hours as needed for moderate pain., Disp: , Rfl:    atorvastatin (LIPITOR) 10 MG tablet, TAKE ONE-HALF TABLET BY  MOUTH DAILY AT 6 PM (Patient taking differently: Take 10 mg by mouth  daily.), Disp: 45 tablet, Rfl: 1   cetirizine (ZYRTEC) 10 MG tablet, Take 10 mg by mouth daily., Disp: , Rfl:    clopidogrel (PLAVIX) 75 MG tablet, TAKE 1 TABLET BY MOUTH  DAILY WITH BREAKFAST (Patient taking differently: Take 75 mg by mouth daily with breakfast.), Disp: 90 tablet, Rfl: 1   Multiple Vitamins-Minerals (MULTIVITAMIN PO), Take 1 tablet by mouth daily., Disp: , Rfl:    sulfamethoxazole-trimethoprim (BACTRIM DS) 800-160 MG tablet, Take 1 tablet by mouth 2 (two) times daily for 7 days., Disp: 14 tablet, Rfl: 0   tamsulosin (FLOMAX) 0.4 MG CAPS capsule, TAKE 1 CAPSULE BY MOUTH IN  THE EVENING (Patient taking differently: Take 0.4 mg by mouth daily.), Disp: 90 capsule, Rfl: 1   Turmeric 1053 MG TABS, Take 1 tablet by mouth daily., Disp: , Rfl:    Allergies  Allergen Reactions   Amlodipine Swelling    Swelling of lips.   Lisinopril Other (See Comments)    Elevated BP higher & causes a burning feeling on the inside.    Past Medical History:  Diagnosis Date   Aortic regurgitation    Moderate   Arthritis    BPH (benign prostatic hyperplasia)    Cervical disc disease    Cervical radiculopathy    Hyperlipidemia    Hypertension    Prediabetes 02/16/2021   Staphylococcus aureus bacteremia 08/09/2012   TEE negative for vegetation or thrombus February 2014   Stroke [facility]) 2005 or 2006   3   Symptomatic carotid artery  stenosis with infarction [facility]) 2005 or 2006   Status post right carotid endarterectomy    Past Surgical History:  Procedure Laterality Date   BACK SURGERY     CAROTID ENDARTERECTOMY     CATARACT EXTRACTION W/PHACO Left 02/15/2015   Procedure: CATARACT EXTRACTION PHACO AND INTRAOCULAR LENS PLACEMENT LEFT EYE CDE=30.93;  Surgeon: Tonny Branch, MD;  Location: [facility] OR[facility];  Service: Ophthalmology;  Laterality: Left;   CHOLECYSTECTOMY     KIDNEY STONE SURGERY     LUMBAR LAMINECTOMY/DECOMPRESSION MICRODISCECTOMY Bilateral 01/23/2014   Procedure: LUMBAR LAMINECTOMY/DECOMPRESSION  MICRODISCECTOMY 1 LEVEL L5-S1;  Surgeon: Charlie Pitter, MD;  Location: [place] [facility];  Service: Neurosurgery;  Laterality: Bilateral;  LUMBAR LAMINECTOMY/DECOMPRESSION MICRODISCECTOMY 1 LEVEL L5-S1   TEE WITHOUT CARDIOVERSION N/A 08/12/2012   Procedure: TRANSESOPHAGEAL ECHOCARDIOGRAM (TEE);  Surgeon: Josue Hector, MD;  Location: [facility];  Service: Cardiovascular;  Laterality: N/A;   TEE WITHOUT CARDIOVERSION N/A 06/24/2013   Procedure: TRANSESOPHAGEAL ECHOCARDIOGRAM (TEE);  Surgeon: Satira Sark, MD;  Location: [facility];  Service: Endoscopy;  Laterality: N/A;    Social History   Socioeconomic History   Marital status: Married    Spouse name: Not on file   Number of children: Not on file   Years of education: Not on file   Highest education level: Not on file  Occupational History   Not on file  Tobacco Use   Smoking status: Never   Smokeless tobacco: Never  Vaping Use   Vaping Use: Never used  Substance and Sexual Activity   Alcohol use: No   Drug use: No   Sexual activity: Not Currently  Other Topics Concern   Not on file  Social History Narrative   Not on file   Social Determinants of Health   Financial Resource Strain: Not on file  Food Insecurity: Not on file  Transportation Needs: Not on file  Physical Activity: Not on file  Stress: Not on file  Social Connections: Not on file  Intimate Partner Violence: Not on file        Objective:    BP 137/75    Pulse (!) 110    Temp (!) 97 F (36.1 C) (Temporal)    Ht 5' 5" (1.651 m)    Wt 160 lb (72.6 kg)    SpO2 98%    BMI 26.63 kg/m   Wt Readings from Last 3 Encounters:  07/09/21 160 lb (72.6 kg)  06/29/21 160 lb (72.6 kg)  05/23/21 163 lb (73.9 kg)    Physical Exam Vitals reviewed.  Constitutional:      General: He is not in acute distress.    Appearance: Normal appearance. He is overweight. He is not ill-appearing, toxic-appearing or diaphoretic.  HENT:     Head: Normocephalic and atraumatic.   Eyes:     General: No scleral icterus.       Right eye: No discharge.        Left eye: No discharge.     Conjunctiva/sclera: Conjunctivae normal.  Cardiovascular:     Rate and Rhythm: Normal rate and regular rhythm.     Heart sounds: Normal heart sounds. No murmur heard.   No friction rub. No gallop.  Pulmonary:     Effort: Pulmonary effort is normal. No respiratory distress.     Breath sounds: Normal breath sounds. No stridor. No wheezing, rhonchi or rales.  Musculoskeletal:        General: Normal range of motion.     Cervical back:  Normal range of motion.  Skin:    General: Skin is warm and dry.  Neurological:     Mental Status: He is alert and oriented to person, place, and time. Mental status is at baseline.     Motor: Weakness present.     Gait: Gait abnormal (ambulating with cane).  Psychiatric:        Mood and Affect: Mood normal.        Behavior: Behavior normal.        Thought Content: Thought content normal.        Judgment: Judgment normal.    Lab Results  Component Value Date   TSH 2.508 06/21/2013   Lab Results  Component Value Date   WBC 5.5 07/02/2021   HGB 10.8 (L) 07/02/2021   HCT 34.4 (L) 07/02/2021   MCV 95.6 07/02/2021   PLT 160 07/02/2021   Lab Results  Component Value Date   NA 137 07/02/2021   K 3.9 07/02/2021   CO2 28 07/02/2021   GLUCOSE 97 07/02/2021   BUN 14 07/02/2021   CREATININE 0.78 07/02/2021   BILITOT 0.4 07/02/2021   ALKPHOS 45 07/02/2021   AST 32 07/02/2021   ALT 17 07/02/2021   PROT 5.9 (L) 07/02/2021   ALBUMIN 3.1 (L) 07/02/2021   CALCIUM 8.4 (L) 07/02/2021   ANIONGAP 7 07/02/2021   EGFR 60 05/23/2021   Lab Results  Component Value Date   CHOL 100 07/01/2021   Lab Results  Component Value Date   HDL 48 07/01/2021   Lab Results  Component Value Date   LDLCALC 37 07/01/2021   Lab Results  Component Value Date   TRIG 74 07/01/2021   Lab Results  Component Value Date   CHOLHDL 2.1 07/01/2021   Lab Results   Component Value Date   HGBA1C 5.8 (H) 02/13/2021

## 2021-07-10 DIAGNOSIS — I2510 Atherosclerotic heart disease of native coronary artery without angina pectoris: Secondary | ICD-10-CM | POA: Diagnosis not present

## 2021-07-10 DIAGNOSIS — E782 Mixed hyperlipidemia: Secondary | ICD-10-CM | POA: Diagnosis not present

## 2021-07-10 DIAGNOSIS — I10 Essential (primary) hypertension: Secondary | ICD-10-CM | POA: Diagnosis not present

## 2021-07-10 DIAGNOSIS — N390 Urinary tract infection, site not specified: Secondary | ICD-10-CM | POA: Diagnosis not present

## 2021-07-10 LAB — CBC WITH DIFFERENTIAL/PLATELET
Basophils Absolute: 0 10*3/uL (ref 0.0–0.2)
Basos: 1 %
EOS (ABSOLUTE): 0.1 10*3/uL (ref 0.0–0.4)
Eos: 2 %
Hematocrit: 36.6 % — ABNORMAL LOW (ref 37.5–51.0)
Hemoglobin: 11.9 g/dL — ABNORMAL LOW (ref 13.0–17.7)
Immature Grans (Abs): 0.1 10*3/uL (ref 0.0–0.1)
Immature Granulocytes: 1 %
Lymphocytes Absolute: 1.4 10*3/uL (ref 0.7–3.1)
Lymphs: 21 %
MCH: 30.6 pg (ref 26.6–33.0)
MCHC: 32.5 g/dL (ref 31.5–35.7)
MCV: 94 fL (ref 79–97)
Monocytes Absolute: 0.5 10*3/uL (ref 0.1–0.9)
Monocytes: 7 %
Neutrophils Absolute: 4.6 10*3/uL (ref 1.4–7.0)
Neutrophils: 68 %
Platelets: 348 10*3/uL (ref 150–450)
RBC: 3.89 x10E6/uL — ABNORMAL LOW (ref 4.14–5.80)
RDW: 11.9 % (ref 11.6–15.4)
WBC: 6.6 10*3/uL (ref 3.4–10.8)

## 2021-07-12 DIAGNOSIS — I2510 Atherosclerotic heart disease of native coronary artery without angina pectoris: Secondary | ICD-10-CM | POA: Diagnosis not present

## 2021-07-12 DIAGNOSIS — I10 Essential (primary) hypertension: Secondary | ICD-10-CM | POA: Diagnosis not present

## 2021-07-12 DIAGNOSIS — N390 Urinary tract infection, site not specified: Secondary | ICD-10-CM | POA: Diagnosis not present

## 2021-07-12 DIAGNOSIS — E782 Mixed hyperlipidemia: Secondary | ICD-10-CM | POA: Diagnosis not present

## 2021-07-15 DIAGNOSIS — I10 Essential (primary) hypertension: Secondary | ICD-10-CM | POA: Diagnosis not present

## 2021-07-15 DIAGNOSIS — I2510 Atherosclerotic heart disease of native coronary artery without angina pectoris: Secondary | ICD-10-CM | POA: Diagnosis not present

## 2021-07-15 DIAGNOSIS — E782 Mixed hyperlipidemia: Secondary | ICD-10-CM | POA: Diagnosis not present

## 2021-07-15 DIAGNOSIS — N390 Urinary tract infection, site not specified: Secondary | ICD-10-CM | POA: Diagnosis not present

## 2021-07-17 DIAGNOSIS — E782 Mixed hyperlipidemia: Secondary | ICD-10-CM | POA: Diagnosis not present

## 2021-07-17 DIAGNOSIS — I10 Essential (primary) hypertension: Secondary | ICD-10-CM | POA: Diagnosis not present

## 2021-07-17 DIAGNOSIS — N390 Urinary tract infection, site not specified: Secondary | ICD-10-CM | POA: Diagnosis not present

## 2021-07-17 DIAGNOSIS — I2510 Atherosclerotic heart disease of native coronary artery without angina pectoris: Secondary | ICD-10-CM | POA: Diagnosis not present

## 2021-07-18 DIAGNOSIS — E782 Mixed hyperlipidemia: Secondary | ICD-10-CM | POA: Diagnosis not present

## 2021-07-18 DIAGNOSIS — N390 Urinary tract infection, site not specified: Secondary | ICD-10-CM | POA: Diagnosis not present

## 2021-07-18 DIAGNOSIS — I10 Essential (primary) hypertension: Secondary | ICD-10-CM | POA: Diagnosis not present

## 2021-07-18 DIAGNOSIS — I2510 Atherosclerotic heart disease of native coronary artery without angina pectoris: Secondary | ICD-10-CM | POA: Diagnosis not present

## 2021-07-24 DIAGNOSIS — I2510 Atherosclerotic heart disease of native coronary artery without angina pectoris: Secondary | ICD-10-CM | POA: Diagnosis not present

## 2021-07-24 DIAGNOSIS — N3000 Acute cystitis without hematuria: Secondary | ICD-10-CM | POA: Diagnosis not present

## 2021-07-24 DIAGNOSIS — I10 Essential (primary) hypertension: Secondary | ICD-10-CM | POA: Diagnosis not present

## 2021-07-24 DIAGNOSIS — M48061 Spinal stenosis, lumbar region without neurogenic claudication: Secondary | ICD-10-CM | POA: Diagnosis not present

## 2021-07-25 DIAGNOSIS — N3000 Acute cystitis without hematuria: Secondary | ICD-10-CM | POA: Diagnosis not present

## 2021-07-25 DIAGNOSIS — I2510 Atherosclerotic heart disease of native coronary artery without angina pectoris: Secondary | ICD-10-CM | POA: Diagnosis not present

## 2021-07-25 DIAGNOSIS — I10 Essential (primary) hypertension: Secondary | ICD-10-CM | POA: Diagnosis not present

## 2021-07-25 DIAGNOSIS — M48061 Spinal stenosis, lumbar region without neurogenic claudication: Secondary | ICD-10-CM | POA: Diagnosis not present

## 2021-07-29 ENCOUNTER — Encounter: Payer: Self-pay | Admitting: Urology

## 2021-07-29 ENCOUNTER — Other Ambulatory Visit: Payer: Self-pay

## 2021-07-29 ENCOUNTER — Ambulatory Visit: Payer: Medicare Other | Admitting: Urology

## 2021-07-29 VITALS — BP 192/94 | HR 93 | Wt 160.0 lb

## 2021-07-29 DIAGNOSIS — R310 Gross hematuria: Secondary | ICD-10-CM

## 2021-07-29 DIAGNOSIS — N138 Other obstructive and reflux uropathy: Secondary | ICD-10-CM

## 2021-07-29 DIAGNOSIS — R350 Frequency of micturition: Secondary | ICD-10-CM | POA: Diagnosis not present

## 2021-07-29 DIAGNOSIS — N401 Enlarged prostate with lower urinary tract symptoms: Secondary | ICD-10-CM | POA: Diagnosis not present

## 2021-07-29 MED ORDER — FINASTERIDE 5 MG PO TABS: 5.0000 mg | ORAL_TABLET | Freq: Every day | ORAL | 3 refills | Status: DC

## 2021-07-29 NOTE — Progress Notes (Signed)
Urological Symptom Review  Patient is experiencing the following symptoms: Urinary tract infection   Review of Systems  Gastrointestinal (upper)  : Negative for upper GI symptoms  Gastrointestinal (lower) : Negative for lower GI symptoms  Constitutional : Negative for symptoms  Skin: Negative for skin symptoms  Eyes: Negative for eye symptoms  Ear/Nose/Throat : Negative for Ear/Nose/Throat symptoms  Hematologic/Lymphatic: Easy bruising  Cardiovascular : Negative for cardiovascular symptoms  Respiratory : Negative for respiratory symptoms  Endocrine: Negative for endocrine symptoms  Musculoskeletal: Back pain Joint pain  Neurological: Dizziness  Psychologic: Negative for psychiatric symptoms

## 2021-07-29 NOTE — Progress Notes (Signed)
07/29/2021 1:34 PM   Paul Bradshaw May 03, 1926 [id number]  Referring provider: Loman [place], [place],  [place] [id number]  No chief complaint on file.   HPI: New patient-  1) BPH with lower urine tract symptoms-prostate about 55 g on imaging.  He is on tamsulosin.  No prostate surgery. He has some urgency. Sometimes incontinence. He admits he'll work half a day and drink no water, but has gotten better about drinking water and juice recently.  2) gross hematuria - noted Nov 2022 - he passed a clot after taking "two aspirin". Associated with positive urine culture for E. coli in November 2022 in January 2023.  Creatinine is normal at 0.78.  CT scan of the abdomen and pelvis was obtained November 2022 which showed no stone or hydronephrosis.  Prostate was about 55 g.  His PSA was 2.0 back in 2021.  He is on clopidogrel.  Today, seen for the above. Here with his daughter. 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    PMH: Past Medical History:  Diagnosis Date   Aortic regurgitation    Moderate   Arthritis    BPH (benign prostatic hyperplasia)    Cervical disc disease    Cervical radiculopathy    Hyperlipidemia    Hypertension    Prediabetes 02/16/2021   Staphylococcus aureus bacteremia 08/09/2012   TEE negative for vegetation or thrombus February 2014   Stroke [facility]) 2005 or 2006   3   Symptomatic carotid artery stenosis with infarction [facility]) 2005 or 2006   Status post right carotid endarterectomy    Surgical History: Past Surgical History:  Procedure Laterality Date   BACK SURGERY     CAROTID ENDARTERECTOMY     CATARACT EXTRACTION W/PHACO Left 02/15/2015   Procedure: CATARACT EXTRACTION PHACO AND INTRAOCULAR LENS PLACEMENT LEFT EYE CDE=30.93;  Surgeon: Tonny Branch, MD;  Location: [facility];  Service: Ophthalmology;  Laterality: Left;   CHOLECYSTECTOMY     KIDNEY STONE SURGERY     LUMBAR LAMINECTOMY/DECOMPRESSION MICRODISCECTOMY Bilateral 01/23/2014    Procedure: LUMBAR LAMINECTOMY/DECOMPRESSION MICRODISCECTOMY 1 LEVEL L5-S1;  Surgeon: Charlie Pitter, MD;  Location: [place] [facility];  Service: Neurosurgery;  Laterality: Bilateral;  LUMBAR LAMINECTOMY/DECOMPRESSION MICRODISCECTOMY 1 LEVEL L5-S1   TEE WITHOUT CARDIOVERSION N/A 08/12/2012   Procedure: TRANSESOPHAGEAL ECHOCARDIOGRAM (TEE);  Surgeon: Josue Hector, MD;  Location: [facility];  Service: Cardiovascular;  Laterality: N/A;   TEE WITHOUT CARDIOVERSION N/A 06/24/2013   Procedure: TRANSESOPHAGEAL ECHOCARDIOGRAM (TEE);  Surgeon: Satira Sark, MD;  Location: [facility];  Service: Endoscopy;  Laterality: N/A;    Home Medications:  Allergies as of 07/29/2021       Reactions   Amlodipine Swelling   Swelling of lips.   Lisinopril Other (See Comments)   Elevated BP higher & causes a burning feeling on the inside.         Medication List        Accurate as of July 29, 2021  1:34 PM. If you have any questions, ask your nurse or doctor.          acetaminophen 500 MG tablet Commonly known as: TYLENOL Take 1,000 mg by mouth every 8 (eight) hours as needed for moderate pain.   atorvastatin 10 MG tablet Commonly known as: LIPITOR TAKE ONE-HALF TABLET BY  MOUTH DAILY AT 6 PM What changed: See the new instructions.   cetirizine 10 MG tablet Commonly known as: ZYRTEC Take 10 mg by mouth  daily.   clopidogrel 75 MG tablet Commonly known as: PLAVIX TAKE 1 TABLET BY MOUTH  DAILY WITH BREAKFAST   MULTIVITAMIN PO Take 1 tablet by mouth daily.   tamsulosin 0.4 MG Caps capsule Commonly known as: FLOMAX TAKE 1 CAPSULE BY MOUTH IN  THE EVENING What changed: See the new instructions.   Turmeric 1053 MG Tabs Take 1 tablet by mouth daily.        Allergies:  Allergies  Allergen Reactions   Amlodipine Swelling    Swelling of lips.   Lisinopril Other (See Comments)    Elevated BP higher & causes a burning feeling on the inside.     Family History: Family History   Problem Relation Age of Onset   Emphysema Father    Alzheimer's disease Mother    Heart disease Brother    Heart attack Brother    COPD Son     Social History:  reports that he has never smoked. He has never used smokeless tobacco. He reports that he does not drink alcohol and does not use drugs.   Physical Exam: There were no vitals taken for this visit.  Constitutional:  Alert and oriented, No acute distress. HEENT: [place] AT, moist mucus membranes.  Trachea midline, no masses. Cardiovascular: No clubbing, cyanosis, or edema. Respiratory: Normal respiratory effort, no increased work of breathing. GI: Abdomen is soft, nontender, nondistended, no abdominal masses GU: No CVA tenderness Lymph: No cervical or inguinal lymphadenopathy. Skin: No rashes, bruises or suspicious lesions. Neurologic: Grossly intact, no focal deficits, moving all 4 extremities. Psychiatric: Normal mood and affect. GU: Penis uncircumcised, normal foreskin, testicles descended bilaterally and palpably normal, bilateral epididymis palpably normal, scrotum normal DRE: Prostate 60 g, smooth without hard area or nodule   Laboratory Data: Lab Results  Component Value Date   WBC 6.6 07/09/2021   HGB 11.9 (L) 07/09/2021   HCT 36.6 (L) 07/09/2021   MCV 94 07/09/2021   PLT 348 07/09/2021    Lab Results  Component Value Date   CREATININE 0.78 07/02/2021    No results found for: PSA  No results found for: TESTOSTERONE  Lab Results  Component Value Date   HGBA1C 5.8 (H) 02/13/2021    Urinalysis    Component Value Date/Time   COLORURINE YELLOW 06/29/2021 1438   APPEARANCEUR Clear 07/09/2021 1116   LABSPEC 1.014 06/29/2021 1438   PHURINE 7.0 06/29/2021 1438   GLUCOSEU Negative 07/09/2021 1116   HGBUR MODERATE (A) 06/29/2021 1438   BILIRUBINUR Negative 07/09/2021 1116   KETONESUR 20 (A) 06/29/2021 1438   PROTEINUR Negative 07/09/2021 1116   PROTEINUR 30 (A) 06/29/2021 1438   UROBILINOGEN 2.0 (H)  08/08/2012 1104   NITRITE Negative 07/09/2021 1116   NITRITE NEGATIVE 06/29/2021 1438   LEUKOCYTESUR Negative 07/09/2021 1116   LEUKOCYTESUR LARGE (A) 06/29/2021 1438    Lab Results  Component Value Date   BACTERIA MANY (A) 06/29/2021    Pertinent Imaging:  Results for orders placed during the hospital encounter of 05/06/21  CT Renal Stone Study  Narrative CLINICAL DATA:  Hematuria of unknown cause. Dysuria and incontinence. Patient fell 3-4 days ago.  EXAM: CT ABDOMEN AND PELVIS WITHOUT CONTRAST  TECHNIQUE: Multidetector CT imaging of the abdomen and pelvis was performed following the standard protocol without IV contrast.  COMPARISON:  None.  FINDINGS: Lower chest: Significant coronary artery calcifications. Heart size is normal. No pericardial effusion.  Hepatobiliary: Calcified granulomata within the liver. Status post cholecystectomy.  Pancreas: Unremarkable. No pancreatic ductal dilatation  or surrounding inflammatory changes.  Spleen: Normal in size without focal abnormality.  Adrenals/Urinary Tract: Adrenal glands are normal.  RIGHT kidney: LOWER pole cyst is 3.0 centimeters. There is no hydronephrosis. RIGHT ureter is unremarkable.  LEFT kidney: 3 millimeter calculus identified in the midpole region. No hydronephrosis. No renal mass. LEFT ureter is unremarkable. Bladder is decompressed with thickened wall. There is stranding surrounding the urinary bladder, consistent with cystitis or bladder outlet obstruction.  Stomach/Bowel: Large hiatal hernia. Stomach and small bowel loops are otherwise normal in appearance. Numerous colonic diverticula without evidence for acute diverticulitis. The appendix is well seen and has a normal appearance.  Vascular/Lymphatic: There is moderate atherosclerosis of the abdominal aorta and its branches. No adenopathy.  Reproductive: Prostate is enlarged and partially calcified.  Other: No ascites.  Abdominal wall is  unremarkable.  Musculoskeletal: Previous lumbar laminectomy. There has been fusion of L2-3. There is posterior listhesis of L3 on L4 by 9 millimeters. Marked LOWER lumbar degenerative changes. No lytic or blastic lesions.  IMPRESSION: 1. Thickened urinary bladder wall and stranding surrounding the bladder, favoring cystitis. Similar appearance can be seen with bladder outlet obstruction. 2. Prostatic enlargement. 3. Significant coronary artery disease. 4. Cholecystectomy. 5. Nephrolithiasis. 6. Large hiatal hernia. 7.  Aortic atherosclerosis.  (ICD10-I70.0) 8. Prior lumbar fusion and laminectomy. Degenerative changes in the LOWER lumbar spine.   Electronically Signed By: Nolon Nations M.D. On: 05/06/2021 13:04   Assessment & Plan:    1) BPH - discussed nature r/b of adding 5ari - disc fda warning. He will add 5ari to tamsulosin. Continue good water intake. UA clear today.   2) gross hematuria - I recommended cystoscopy. He will proceed.   No follow-ups on file.  Festus Aloe, MD  [facility]  [address] [place], [place] [address] [phone]

## 2021-07-29 NOTE — Addendum Note (Signed)
Addended by: Clearence Cheek on: 07/29/2021 04:22 PM   Modules accepted: Orders

## 2021-07-31 DIAGNOSIS — N3000 Acute cystitis without hematuria: Secondary | ICD-10-CM | POA: Diagnosis not present

## 2021-07-31 DIAGNOSIS — I2510 Atherosclerotic heart disease of native coronary artery without angina pectoris: Secondary | ICD-10-CM | POA: Diagnosis not present

## 2021-07-31 DIAGNOSIS — M48061 Spinal stenosis, lumbar region without neurogenic claudication: Secondary | ICD-10-CM | POA: Diagnosis not present

## 2021-07-31 DIAGNOSIS — I10 Essential (primary) hypertension: Secondary | ICD-10-CM | POA: Diagnosis not present

## 2021-08-02 DIAGNOSIS — I2510 Atherosclerotic heart disease of native coronary artery without angina pectoris: Secondary | ICD-10-CM | POA: Diagnosis not present

## 2021-08-02 DIAGNOSIS — I10 Essential (primary) hypertension: Secondary | ICD-10-CM | POA: Diagnosis not present

## 2021-08-02 DIAGNOSIS — N3000 Acute cystitis without hematuria: Secondary | ICD-10-CM | POA: Diagnosis not present

## 2021-08-02 DIAGNOSIS — M48061 Spinal stenosis, lumbar region without neurogenic claudication: Secondary | ICD-10-CM | POA: Diagnosis not present

## 2021-08-06 ENCOUNTER — Encounter: Payer: Self-pay | Admitting: Nurse Practitioner

## 2021-08-06 ENCOUNTER — Ambulatory Visit (INDEPENDENT_AMBULATORY_CARE_PROVIDER_SITE_OTHER): Payer: Medicare Other | Admitting: Nurse Practitioner

## 2021-08-06 VITALS — BP 186/93 | HR 107 | Temp 98.1°F | Resp 20 | Ht 65.0 in | Wt 162.0 lb

## 2021-08-06 DIAGNOSIS — M5442 Lumbago with sciatica, left side: Secondary | ICD-10-CM | POA: Diagnosis not present

## 2021-08-06 MED ORDER — METHYLPREDNISOLONE ACETATE 40 MG/ML IJ SUSP
40.0000 mg | Freq: Once | INTRAMUSCULAR | Status: AC
  Administered 2021-08-06: 40 mg via INTRAMUSCULAR

## 2021-08-06 NOTE — Progress Notes (Signed)
° °  Subjective:    Patient ID: Paul Bradshaw, male    DOB: 29-Mar-1926, 88 y.o.   MRN: [id number]   Chief Complaint: Back Pain   Back Pain This is a new problem. The current episode started more than 1 month ago. The problem occurs intermittently (he fell in  novemebr and his back has been bothering him since.). The problem has been gradually worsening since onset. The pain is present in the lumbar spine. The quality of the pain is described as stabbing and aching. The pain does not radiate. The pain is at a severity of 6/10 (pain radiates down left leg to knee area.). The pain is moderate. The pain is The same all the time. The symptoms are aggravated by bending and sitting. Associated symptoms include weakness. Pertinent negatives include no numbness. He has tried nothing for the symptoms.      Review of Systems  Musculoskeletal:  Positive for back pain.  Neurological:  Positive for weakness. Negative for numbness.      Objective:   Physical Exam Vitals reviewed.  Constitutional:      Appearance: Normal appearance.  Cardiovascular:     Rate and Rhythm: Normal rate.     Heart sounds: Normal heart sounds.  Pulmonary:     Effort: Pulmonary effort is normal.     Breath sounds: Normal breath sounds.  Musculoskeletal:     Comments: Walking with cane (-) SLR bil DTRS equal  Skin:    General: Skin is warm.  Neurological:     General: No focal deficit present.     Mental Status: He is alert and oriented to person, place, and time.  Psychiatric:        Mood and Affect: Mood normal.        Behavior: Behavior normal.   BP (!) 186/93    Pulse (!) 107    Temp 98.1 F (36.7 C) (Temporal)    Resp 20    Ht 5\' 5"  (1.651 m)    Wt 162 lb (73.5 kg)    SpO2 97%    BMI 26.96 kg/m         Assessment & Plan:   Paul Bradshaw in today with chief complaint of Back Pain   1. Acute left-sided low back pain with left-sided sciatica Moist heat  Rest RTO prn   Meds ordered this encounter   Medications   methylPREDNISolone acetate (DEPO-MEDROL) injection 40 mg       The above assessment and management plan was discussed with the patient. The patient verbalized understanding of and has agreed to the management plan. Patient is aware to call the clinic if symptoms persist or worsen. Patient is aware when to return to the clinic for a follow-up visit. Patient educated on when it is appropriate to go to the emergency department.   Paul Bradshaw , FNP

## 2021-08-06 NOTE — Patient Instructions (Signed)

## 2021-08-08 DIAGNOSIS — I10 Essential (primary) hypertension: Secondary | ICD-10-CM | POA: Diagnosis not present

## 2021-08-08 DIAGNOSIS — M48061 Spinal stenosis, lumbar region without neurogenic claudication: Secondary | ICD-10-CM | POA: Diagnosis not present

## 2021-08-08 DIAGNOSIS — N3000 Acute cystitis without hematuria: Secondary | ICD-10-CM | POA: Diagnosis not present

## 2021-08-08 DIAGNOSIS — I2510 Atherosclerotic heart disease of native coronary artery without angina pectoris: Secondary | ICD-10-CM | POA: Diagnosis not present

## 2021-08-19 ENCOUNTER — Telehealth: Payer: Self-pay

## 2021-08-19 ENCOUNTER — Other Ambulatory Visit: Payer: Self-pay

## 2021-08-19 MED ORDER — FINASTERIDE 5 MG PO TABS: 5.0000 mg | ORAL_TABLET | Freq: Every day | ORAL | 3 refills | Status: DC

## 2021-08-19 NOTE — Telephone Encounter (Signed)
Medication sent to pharmacy  

## 2021-08-19 NOTE — Telephone Encounter (Signed)
Patient needing medication called into a different pharmacy. The original pharmacy [facility] is out of stock.   Medication: finasteride (PROSCAR) 5 MG tablet  Pharmacy: [facility] [place], [place] - [address]

## 2021-08-20 ENCOUNTER — Encounter: Payer: Self-pay | Admitting: Family Medicine

## 2021-08-20 ENCOUNTER — Ambulatory Visit (INDEPENDENT_AMBULATORY_CARE_PROVIDER_SITE_OTHER): Payer: Medicare Other | Admitting: Family Medicine

## 2021-08-20 VITALS — BP 143/82 | HR 97 | Temp 98.1°F | Ht 65.0 in | Wt 161.4 lb

## 2021-08-20 DIAGNOSIS — I6522 Occlusion and stenosis of left carotid artery: Secondary | ICD-10-CM

## 2021-08-20 DIAGNOSIS — Z0001 Encounter for general adult medical examination with abnormal findings: Secondary | ICD-10-CM | POA: Diagnosis not present

## 2021-08-20 DIAGNOSIS — Z8673 Personal history of transient ischemic attack (TIA), and cerebral infarction without residual deficits: Secondary | ICD-10-CM | POA: Diagnosis not present

## 2021-08-20 DIAGNOSIS — R739 Hyperglycemia, unspecified: Secondary | ICD-10-CM | POA: Diagnosis not present

## 2021-08-20 DIAGNOSIS — J40 Bronchitis, not specified as acute or chronic: Secondary | ICD-10-CM

## 2021-08-20 DIAGNOSIS — E782 Mixed hyperlipidemia: Secondary | ICD-10-CM | POA: Diagnosis not present

## 2021-08-20 DIAGNOSIS — R7303 Prediabetes: Secondary | ICD-10-CM | POA: Diagnosis not present

## 2021-08-20 DIAGNOSIS — G8929 Other chronic pain: Secondary | ICD-10-CM

## 2021-08-20 DIAGNOSIS — R051 Acute cough: Secondary | ICD-10-CM

## 2021-08-20 DIAGNOSIS — N400 Enlarged prostate without lower urinary tract symptoms: Secondary | ICD-10-CM

## 2021-08-20 DIAGNOSIS — R030 Elevated blood-pressure reading, without diagnosis of hypertension: Secondary | ICD-10-CM

## 2021-08-20 DIAGNOSIS — Z0000 Encounter for general adult medical examination without abnormal findings: Secondary | ICD-10-CM | POA: Diagnosis not present

## 2021-08-20 DIAGNOSIS — Z7189 Other specified counseling: Secondary | ICD-10-CM | POA: Diagnosis not present

## 2021-08-20 DIAGNOSIS — I700 Atherosclerosis of aorta: Secondary | ICD-10-CM | POA: Diagnosis not present

## 2021-08-20 DIAGNOSIS — M5442 Lumbago with sciatica, left side: Secondary | ICD-10-CM | POA: Diagnosis not present

## 2021-08-20 LAB — BAYER DCA HB A1C WAIVED: HB A1C (BAYER DCA - WAIVED): 5.2 % (ref 4.8–5.6)

## 2021-08-20 MED ORDER — ALBUTEROL SULFATE HFA 108 (90 BASE) MCG/ACT IN AERS: 2.0000 | INHALATION_SPRAY | Freq: Four times a day (QID) | RESPIRATORY_TRACT | 0 refills | Status: DC | PRN

## 2021-08-20 MED ORDER — PREDNISONE 20 MG PO TABS: 40.0000 mg | ORAL_TABLET | Freq: Every day | ORAL | 0 refills | Status: AC

## 2021-08-20 NOTE — Progress Notes (Signed)
Assessment & Plan:  1. Well adult exam Preventive health education provided. - CBC with Differential/Platelet - CMP14+EGFR  2. ACP (advance care planning) Documents and education provided to patient.  3. White coat syndrome without hypertension  4. Mixed hyperlipidemia Well controlled on current regimen.  - CMP14+EGFR  5. Aortic atherosclerosis (HCC) Continue atorvastatin and Plavix. - CMP14+EGFR  6. Stenosis of left carotid artery His last carotid ultrasound was in 2014.  Reassessing at this time. - US Carotid Duplex Bilateral; Future  7. History of CVA (cerebrovascular accident) Continue atorvastatin and Plavix.  8. Benign prostatic hyperplasia, unspecified whether lower urinary tract symptoms present Well controlled on current regimen.  - CMP14+EGFR  9. Prediabetes Resolved with A1c of 5.2 today. - Bayer DCA Hb A1c Waived  10. Chronic left-sided low back pain with left-sided sciatica - predniSONE (DELTASONE) 20 MG tablet; Take 2 tablets (40 mg total) by mouth daily with breakfast for 5 days.  Dispense: 10 tablet; Refill: 0  11. Bronchitis - predniSONE (DELTASONE) 20 MG tablet; Take 2 tablets (40 mg total) by mouth daily with breakfast for 5 days.  Dispense: 10 tablet; Refill: 0 - albuterol (VENTOLIN HFA) 108 (90 Base) MCG/ACT inhaler; Inhale 2 puffs into the lungs every 6 (six) hours as needed for shortness of breath or wheezing.  Dispense: 18 g; Refill: 0  12. Acute cough - Novel Coronavirus, NAA ([facility]); Future   Follow-up: Return in about 6 months (around 02/17/2022) for follow-up of chronic medication conditions.   Deliah Boston, MSN, APRN, FNP-C [facility]  Subjective:  Patient ID: Paul Bradshaw, male    DOB: 11-Jan-1926  Age: 88 y.o. MRN: [id number]  Patient Care Team: Gwenlyn Fudge, FNP as PCP - General (Family Medicine)   CC:  Chief Complaint  Patient presents with   Annual Exam   Back Pain    Chronic back pain     Cough    X3 days    HPI RYATT CORSINO presents for his annual physical.  Occupation: retired, Marital status: married, Substance use: none Diet: regular, Exercise: was recently working with physical therapy Last eye exam: November 2022 Last dental exam: dentures Immunizations: Flu Vaccine: up to date Tdap Vaccine: declined  Shingrix Vaccine: declined  COVID-19 Vaccine:  has received three doses Pneumonia Vaccine:  patient reports he received both pneumonia vaccines with his previous PCP, Dr. Lysbeth Galas. These are not available in [facility].  Advanced Directives Patient does not have advanced directives including DNR, living will, healthcare power of attorney, financial power of attorney, and MOST form.   DEPRESSION SCREENING PHQ 2/9 Scores 02/13/2021 05/08/2020 10/11/2019 09/16/2019 08/26/2019 05/26/2019  PHQ - 2 Score 0 0 0 0 0 0  PHQ- 9 Score 0 - - - - 0    White coat hypertension: He has not been taking anything for blood pressure due to intolerance. He has not been checking his blood pressure at home since his last visit.   Hyperlipidemia: He has been taking half tablet atorvastatin daily. He is tolerating it well.  History of CVA/CAD/Aortic Atherosclerosis: taking atorvastatin and Plavix daily.   BPH: taking Flomax daily.   Prediabetes: diagnosed in August 2022; A1c 5.8 at that time.    New Complaints: Patient is concerned about chronic back pain and a cough.   He has a diagnosis of herniated nucleus pulposus of the lumbar spine. He had a fall in November 2022 and has been having increased back pain since that time. He reports in  the past when this occurred, Prednisone was helpful. The pain does run down his left leg.  MRI Lumbar Spine on 12/02/2013: IMPRESSION:  1. Chronic but progressed L4-L5 disc degeneration, with new caudal disc herniation and sequestered disc fragment contributing to moderate to severe spinal stenosis at the L5 vertebral level.  2. Other lumbar levels have not  significantly changed since 2014, including chronic severe biforaminal stenosis at L3-L4 and L5-S1.   Patient complains of cough. Additional symptoms include chest congestion and shortness of breath. Onset of symptoms was 3 days ago, unchanged since that time. He is drinking plenty of fluids. Evaluation to date: none. Treatment to date:  Nyquil . He does not smoke. Patient has been fully vaccinated against COVID-19.   Review of Systems  Constitutional:  Negative for chills, fever, malaise/fatigue and weight loss.  HENT:  Positive for congestion. Negative for ear discharge, ear pain, nosebleeds, sinus pain, sore throat and tinnitus.   Eyes:  Negative for blurred vision, double vision, pain, discharge and redness.  Respiratory:  Positive for cough and shortness of breath. Negative for wheezing.   Cardiovascular:  Negative for chest pain, palpitations and leg swelling.  Gastrointestinal:  Negative for abdominal pain, constipation, diarrhea, heartburn, nausea and vomiting.  Genitourinary:  Negative for dysuria, frequency and urgency.       Denies weak stream, split stream, nocturia, and dribbling. Previously had trouble initiating a urine stream, but states this is better since he has been drinking more water.  Musculoskeletal:  Positive for back pain. Negative for myalgias.  Skin:  Negative for rash.  Neurological:  Negative for dizziness, seizures, weakness and headaches.  Psychiatric/Behavioral:  Negative for depression, substance abuse and suicidal ideas. The patient is not nervous/anxious.     Current Outpatient Medications:    acetaminophen (TYLENOL) 500 MG tablet, Take 1,000 mg by mouth every 8 (eight) hours as needed for moderate pain., Disp: , Rfl:    atorvastatin (LIPITOR) 10 MG tablet, TAKE ONE-HALF TABLET BY  MOUTH DAILY AT 6 PM (Patient taking differently: Take 10 mg by mouth daily.), Disp: 45 tablet, Rfl: 1   cetirizine (ZYRTEC) 10 MG tablet, Take 10 mg by mouth daily., Disp: , Rfl:     clopidogrel (PLAVIX) 75 MG tablet, TAKE 1 TABLET BY MOUTH  DAILY WITH BREAKFAST (Patient taking differently: Take 75 mg by mouth daily with breakfast.), Disp: 90 tablet, Rfl: 1   finasteride (PROSCAR) 5 MG tablet, Take 1 tablet (5 mg total) by mouth daily., Disp: 90 tablet, Rfl: 3   Multiple Vitamins-Minerals (MULTIVITAMIN PO), Take 1 tablet by mouth daily., Disp: , Rfl:    tamsulosin (FLOMAX) 0.4 MG CAPS capsule, TAKE 1 CAPSULE BY MOUTH IN  THE EVENING (Patient taking differently: Take 0.4 mg by mouth daily.), Disp: 90 capsule, Rfl: 1   Turmeric 1053 MG TABS, Take 1 tablet by mouth daily., Disp: , Rfl:   Allergies  Allergen Reactions   Amlodipine Swelling    Swelling of lips.   Lisinopril Other (See Comments)    Elevated BP higher & causes a burning feeling on the inside.     Past Medical History:  Diagnosis Date   Aortic regurgitation    Moderate   Arthritis    BPH (benign prostatic hyperplasia)    Cervical disc disease    Cervical radiculopathy    Hyperlipidemia    Hypertension    Prediabetes 02/16/2021   Staphylococcus aureus bacteremia 08/09/2012   TEE negative for vegetation or thrombus February 2014  Stroke [facility]) 2005 or 2006   3   Symptomatic carotid artery stenosis with infarction [facility]) 2005 or 2006   Status post right carotid endarterectomy    Past Surgical History:  Procedure Laterality Date   BACK SURGERY     CAROTID ENDARTERECTOMY     CATARACT EXTRACTION W/PHACO Left 02/15/2015   Procedure: CATARACT EXTRACTION PHACO AND INTRAOCULAR LENS PLACEMENT LEFT EYE CDE=30.93;  Surgeon: Tonny Branch, MD;  Location: [facility];  Service: Ophthalmology;  Laterality: Left;   CHOLECYSTECTOMY     KIDNEY STONE SURGERY     LUMBAR LAMINECTOMY/DECOMPRESSION MICRODISCECTOMY Bilateral 01/23/2014   Procedure: LUMBAR LAMINECTOMY/DECOMPRESSION MICRODISCECTOMY 1 LEVEL L5-S1;  Surgeon: Charlie Pitter, MD;  Location: [facility];  Service: Neurosurgery;  Laterality: Bilateral;  LUMBAR  LAMINECTOMY/DECOMPRESSION MICRODISCECTOMY 1 LEVEL L5-S1   TEE WITHOUT CARDIOVERSION N/A 08/12/2012   Procedure: TRANSESOPHAGEAL ECHOCARDIOGRAM (TEE);  Surgeon: Josue Hector, MD;  Location: [facility];  Service: Cardiovascular;  Laterality: N/A;   TEE WITHOUT CARDIOVERSION N/A 06/24/2013   Procedure: TRANSESOPHAGEAL ECHOCARDIOGRAM (TEE);  Surgeon: Satira Sark, MD;  Location: [facility];  Service: Endoscopy;  Laterality: N/A;    Family History  Problem Relation Age of Onset   Emphysema Father    Alzheimer's disease Mother    Heart disease Brother    Heart attack Brother    COPD Son     Social History   Socioeconomic History   Marital status: Married    Spouse name: Not on file   Number of children: Not on file   Years of education: Not on file   Highest education level: Not on file  Occupational History   Not on file  Tobacco Use   Smoking status: Never   Smokeless tobacco: Never  Vaping Use   Vaping Use: Never used  Substance and Sexual Activity   Alcohol use: No   Drug use: No   Sexual activity: Not Currently  Other Topics Concern   Not on file  Social History Narrative   Not on file   Social Determinants of Health   Financial Resource Strain: Not on file  Food Insecurity: Not on file  Transportation Needs: Not on file  Physical Activity: Not on file  Stress: Not on file  Social Connections: Not on file  Intimate Partner Violence: Not on file      Objective:    BP (!) 143/82    Pulse 97    Temp 98.1 F (36.7 C) (Temporal)    Ht $R'5\' 5"'fp$  (1.651 m)    Wt 161 lb 6.4 oz (73.2 kg)    SpO2 97%    BMI 26.86 kg/m   Wt Readings from Last 3 Encounters:  08/20/21 161 lb 6.4 oz (73.2 kg)  08/06/21 162 lb (73.5 kg)  07/29/21 160 lb (72.6 kg)    Physical Exam Vitals reviewed.  Constitutional:      General: He is not in acute distress.    Appearance: Normal appearance. He is not ill-appearing, toxic-appearing or diaphoretic.  HENT:     Head:  Normocephalic and atraumatic.     Right Ear: Tympanic membrane, ear canal and external ear normal. There is no impacted cerumen.     Left Ear: Tympanic membrane, ear canal and external ear normal. There is no impacted cerumen.     Nose: Nose normal. No congestion or rhinorrhea.     Mouth/Throat:     Mouth: Mucous membranes are moist.     Pharynx: Oropharynx is clear.  No oropharyngeal exudate or posterior oropharyngeal erythema.  Eyes:     General: No scleral icterus.       Right eye: No discharge.        Left eye: No discharge.     Conjunctiva/sclera: Conjunctivae normal.     Pupils: Pupils are equal, round, and reactive to light.  Neck:     Vascular: Carotid bruit (left side) present.  Cardiovascular:     Rate and Rhythm: Regular rhythm. Tachycardia present.     Heart sounds: Normal heart sounds. No murmur heard.   No friction rub. No gallop.  Pulmonary:     Effort: Pulmonary effort is normal. No respiratory distress.     Breath sounds: Normal breath sounds. No stridor. No wheezing, rhonchi or rales.  Abdominal:     General: Abdomen is flat. Bowel sounds are normal. There is no distension.     Palpations: Abdomen is soft. There is no hepatomegaly, splenomegaly or mass.     Tenderness: There is no abdominal tenderness. There is no guarding or rebound.     Hernia: No hernia is present.  Musculoskeletal:        General: Normal range of motion.     Cervical back: Normal range of motion and neck supple. No rigidity. No muscular tenderness.     Lumbar back: Tenderness (left side) present. Scoliosis present.     Right lower leg: No edema.     Left lower leg: No edema.  Lymphadenopathy:     Cervical: No cervical adenopathy.  Skin:    General: Skin is warm and dry.     Capillary Refill: Capillary refill takes less than 2 seconds.  Neurological:     General: No focal deficit present.     Mental Status: He is alert and oriented to person, place, and time. Mental status is at baseline.   Psychiatric:        Mood and Affect: Mood normal.        Behavior: Behavior normal.        Thought Content: Thought content normal.        Judgment: Judgment normal.    Lab Results  Component Value Date   TSH 2.508 06/21/2013   Lab Results  Component Value Date   WBC 6.6 07/09/2021   HGB 11.9 (L) 07/09/2021   HCT 36.6 (L) 07/09/2021   MCV 94 07/09/2021   PLT 348 07/09/2021   Lab Results  Component Value Date   NA 137 07/02/2021   K 3.9 07/02/2021   CO2 28 07/02/2021   GLUCOSE 97 07/02/2021   BUN 14 07/02/2021   CREATININE 0.78 07/02/2021   BILITOT 0.4 07/02/2021   ALKPHOS 45 07/02/2021   AST 32 07/02/2021   ALT 17 07/02/2021   PROT 5.9 (L) 07/02/2021   ALBUMIN 3.1 (L) 07/02/2021   CALCIUM 8.4 (L) 07/02/2021   ANIONGAP 7 07/02/2021   EGFR 60 05/23/2021   Lab Results  Component Value Date   CHOL 100 07/01/2021   Lab Results  Component Value Date   HDL 48 07/01/2021   Lab Results  Component Value Date   LDLCALC 37 07/01/2021   Lab Results  Component Value Date   TRIG 74 07/01/2021   Lab Results  Component Value Date   CHOLHDL 2.1 07/01/2021   Lab Results  Component Value Date   HGBA1C 5.8 (H) 02/13/2021

## 2021-08-21 LAB — CMP14+EGFR
ALT: 12 IU/L (ref 0–44)
AST: 23 IU/L (ref 0–40)
Albumin/Globulin Ratio: 2.3 — ABNORMAL HIGH (ref 1.2–2.2)
Albumin: 4.3 g/dL (ref 3.5–4.6)
Alkaline Phosphatase: 72 IU/L (ref 44–121)
BUN/Creatinine Ratio: 13 (ref 10–24)
BUN: 13 mg/dL (ref 10–36)
Bilirubin Total: 0.4 mg/dL (ref 0.0–1.2)
CO2: 24 mmol/L (ref 20–29)
Calcium: 9 mg/dL (ref 8.6–10.2)
Chloride: 101 mmol/L (ref 96–106)
Creatinine, Ser: 1.02 mg/dL (ref 0.76–1.27)
Globulin, Total: 1.9 g/dL (ref 1.5–4.5)
Glucose: 152 mg/dL — ABNORMAL HIGH (ref 70–99)
Potassium: 4.4 mmol/L (ref 3.5–5.2)
Sodium: 137 mmol/L (ref 134–144)
Total Protein: 6.2 g/dL (ref 6.0–8.5)
eGFR: 69 mL/min/{1.73_m2} (ref >59)

## 2021-08-21 LAB — CBC WITH DIFFERENTIAL/PLATELET
Basophils Absolute: 0 10*3/uL (ref 0.0–0.2)
Basos: 0 %
EOS (ABSOLUTE): 0.1 10*3/uL (ref 0.0–0.4)
Eos: 2 %
Hematocrit: 36.5 % — ABNORMAL LOW (ref 37.5–51.0)
Hemoglobin: 12.1 g/dL — ABNORMAL LOW (ref 13.0–17.7)
Immature Grans (Abs): 0 10*3/uL (ref 0.0–0.1)
Immature Granulocytes: 1 %
Lymphocytes Absolute: 1.6 10*3/uL (ref 0.7–3.1)
Lymphs: 28 %
MCH: 30.8 pg (ref 26.6–33.0)
MCHC: 33.2 g/dL (ref 31.5–35.7)
MCV: 93 fL (ref 79–97)
Monocytes Absolute: 0.8 10*3/uL (ref 0.1–0.9)
Monocytes: 13 %
Neutrophils Absolute: 3.3 10*3/uL (ref 1.4–7.0)
Neutrophils: 56 %
Platelets: 202 10*3/uL (ref 150–450)
RBC: 3.93 x10E6/uL — ABNORMAL LOW (ref 4.14–5.80)
RDW: 12.3 % (ref 11.6–15.4)
WBC: 5.8 10*3/uL (ref 3.4–10.8)

## 2021-08-21 LAB — NOVEL CORONAVIRUS, NAA: SARS-CoV-2, NAA: NOT DETECTED

## 2021-08-25 ENCOUNTER — Encounter: Payer: Self-pay | Admitting: Family Medicine

## 2021-09-11 ENCOUNTER — Ambulatory Visit (HOSPITAL_COMMUNITY): Admission: RE | Admit: 2021-09-11 | Payer: Medicare Other | Source: Ambulatory Visit

## 2021-09-11 ENCOUNTER — Other Ambulatory Visit: Payer: Self-pay | Admitting: Family Medicine

## 2021-09-11 DIAGNOSIS — Z8673 Personal history of transient ischemic attack (TIA), and cerebral infarction without residual deficits: Secondary | ICD-10-CM

## 2021-09-11 DIAGNOSIS — E782 Mixed hyperlipidemia: Secondary | ICD-10-CM

## 2021-09-11 DIAGNOSIS — N401 Enlarged prostate with lower urinary tract symptoms: Secondary | ICD-10-CM

## 2021-09-26 ENCOUNTER — Encounter: Payer: Self-pay | Admitting: Family Medicine

## 2021-09-26 ENCOUNTER — Ambulatory Visit (INDEPENDENT_AMBULATORY_CARE_PROVIDER_SITE_OTHER): Payer: Medicare Other | Admitting: Family Medicine

## 2021-09-26 VITALS — BP 125/70 | HR 85 | Temp 98.0°F | Ht 65.0 in | Wt 160.0 lb

## 2021-09-26 DIAGNOSIS — M5442 Lumbago with sciatica, left side: Secondary | ICD-10-CM

## 2021-09-26 DIAGNOSIS — G8929 Other chronic pain: Secondary | ICD-10-CM | POA: Diagnosis not present

## 2021-09-26 MED ORDER — PREDNISONE 20 MG PO TABS: ORAL_TABLET | ORAL | 0 refills | Status: DC

## 2021-09-26 MED ORDER — METHYLPREDNISOLONE ACETATE 40 MG/ML IJ SUSP
40.0000 mg | Freq: Once | INTRAMUSCULAR | Status: AC
  Administered 2021-09-26: 40 mg via INTRAMUSCULAR

## 2021-09-26 NOTE — Progress Notes (Signed)
?  ? ?Subjective:  ?Patient ID: Paul Bradshaw, male    DOB: 09/29/1925, 88 y.o.   MRN: [id number] ? ?Patient Care Team: ?Gwenlyn FudgeJoyce, Britney F, FNP as PCP - General (Family Medicine)  ? ?Chief Complaint:  Back Pain and Leg Pain (left) ? ? ?HPI: ?Paul ClockBobby L Ponder is a 88 y.o. male presenting on 09/26/2021 for Back Pain and Leg Pain (left) ? ? ?Pt presents today with worsening lower back pain with radiation to left lower leg. Has a history of lumbar stenosis. He denies new injury. He is the primary caregiver for his wife and has been helping her more over the last several days.  ? ?Back Pain ?This is a chronic problem. The current episode started more than 1 year ago. The problem has been waxing and waning since onset. The pain is present in the lumbar spine, sacro-iliac and gluteal. The pain radiates to the left thigh. The symptoms are aggravated by bending, twisting, stress, standing and position. Associated symptoms include leg pain and tingling. Pertinent negatives include no abdominal pain, bladder incontinence, bowel incontinence, chest pain, dysuria, fever, headaches, numbness, paresis, paresthesias, pelvic pain, perianal numbness, weakness or weight loss. He has tried NSAIDs for the symptoms. The treatment provided no relief.  ? ?Relevant past medical, surgical, family, and social history reviewed and updated as indicated.  ?Allergies and medications reviewed and updated. Data reviewed: Chart in Epic. ? ? ?Past Medical History:  ?Diagnosis Date  ? Aortic regurgitation   ? Moderate  ? Arthritis   ? BPH (benign prostatic hyperplasia)   ? Cervical disc disease   ? Cervical radiculopathy   ? Hyperlipidemia   ? Hypertension   ? Prediabetes 02/16/2021  ? Staphylococcus aureus bacteremia 08/09/2012  ? TEE negative for vegetation or thrombus February 2014  ? Stroke [facility](HCC) 2005 or 2006  ? 3  ? Symptomatic carotid artery stenosis with infarction [facility](HCC) 2005 or 2006  ? Status post right carotid endarterectomy  ? ? ?Past Surgical  History:  ?Procedure Laterality Date  ? BACK SURGERY    ? CAROTID ENDARTERECTOMY    ? CATARACT EXTRACTION W/PHACO Left 02/15/2015  ? Procedure: CATARACT EXTRACTION PHACO AND INTRAOCULAR LENS PLACEMENT LEFT EYE CDE=30.93;  Surgeon: Gemma PayorKerry Hunt, MD;  Location: [facility];  Service: Ophthalmology;  Laterality: Left;  ? CHOLECYSTECTOMY    ? KIDNEY STONE SURGERY    ? LUMBAR LAMINECTOMY/DECOMPRESSION MICRODISCECTOMY Bilateral 01/23/2014  ? Procedure: LUMBAR LAMINECTOMY/DECOMPRESSION MICRODISCECTOMY 1 LEVEL L5-S1;  Surgeon: Temple PaciniHenry A Pool, MD;  Location: [facility];  Service: Neurosurgery;  Laterality: Bilateral;  LUMBAR LAMINECTOMY/DECOMPRESSION MICRODISCECTOMY 1 LEVEL L5-S1  ? TEE WITHOUT CARDIOVERSION N/A 08/12/2012  ? Procedure: TRANSESOPHAGEAL ECHOCARDIOGRAM (TEE);  Surgeon: Wendall StadePeter C Nishan, MD;  Location: [facility];  Service: Cardiovascular;  Laterality: N/A;  ? TEE WITHOUT CARDIOVERSION N/A 06/24/2013  ? Procedure: TRANSESOPHAGEAL ECHOCARDIOGRAM (TEE);  Surgeon: Jonelle SidleSamuel G McDowell, MD;  Location: [facility];  Service: Endoscopy;  Laterality: N/A;  ? ? ?Social History  ? ?Socioeconomic History  ? Marital status: Married  ?  Spouse name: Not on file  ? Number of children: Not on file  ? Years of education: Not on file  ? Highest education level: Not on file  ?Occupational History  ? Not on file  ?Tobacco Use  ? Smoking status: Never  ? Smokeless tobacco: Never  ?Vaping Use  ? Vaping Use: Never used  ?Substance and Sexual Activity  ? Alcohol use: No  ? Drug use: No  ? Sexual  activity: Not Currently  ?Other Topics Concern  ? Not on file  ?Social History Narrative  ? Not on file  ? ?Social Determinants of Health  ? ?Financial Resource Strain: Not on file  ?Food Insecurity: Not on file  ?Transportation Needs: Not on file  ?Physical Activity: Not on file  ?Stress: Not on file  ?Social Connections: Not on file  ?Intimate Partner Violence: Not on file  ? ? ?Outpatient Encounter Medications as of 09/26/2021  ?Medication Sig  ?  acetaminophen (TYLENOL) 500 MG tablet Take 1,000 mg by mouth every 8 (eight) hours as needed for moderate pain.  ? albuterol (VENTOLIN HFA) 108 (90 Base) MCG/ACT inhaler Inhale 2 puffs into the lungs every 6 (six) hours as needed for shortness of breath or wheezing.  ? atorvastatin (LIPITOR) 10 MG tablet TAKE ONE-HALF TABLET BY  MOUTH DAILY AT 6 PM  ? cetirizine (ZYRTEC) 10 MG tablet Take 10 mg by mouth daily.  ? clopidogrel (PLAVIX) 75 MG tablet TAKE 1 TABLET BY MOUTH  DAILY WITH BREAKFAST  ? finasteride (PROSCAR) 5 MG tablet Take 1 tablet (5 mg total) by mouth daily.  ? Multiple Vitamins-Minerals (MULTIVITAMIN PO) Take 1 tablet by mouth daily.  ? predniSONE (DELTASONE) 20 MG tablet 2 po at sametime daily for 5 days- start tomorrow  ? tamsulosin (FLOMAX) 0.4 MG CAPS capsule TAKE 1 CAPSULE BY MOUTH IN  THE EVENING  ? Turmeric 1053 MG TABS Take 1 tablet by mouth daily.  ? [EXPIRED] methylPREDNISolone acetate (DEPO-MEDROL) injection 40 mg   ? ?No facility-administered encounter medications on file as of 09/26/2021.  ? ? ?Allergies  ?Allergen Reactions  ? Amlodipine Swelling  ?  Swelling of lips.  ? Lisinopril Other (See Comments)  ?  Elevated BP higher & causes a burning feeling on the inside.   ? ? ?Review of Systems  ?Constitutional:  Negative for activity change, appetite change, chills, diaphoresis, fatigue, fever, unexpected weight change and weight loss.  ?HENT: Negative.    ?Eyes: Negative.   ?Respiratory:  Negative for cough, chest tightness and shortness of breath.   ?Cardiovascular:  Negative for chest pain, palpitations and leg swelling.  ?Gastrointestinal:  Negative for abdominal pain, blood in stool, bowel incontinence, constipation, diarrhea, nausea and vomiting.  ?Endocrine: Negative.   ?Genitourinary:  Negative for bladder incontinence, decreased urine volume, difficulty urinating, dysuria, frequency, pelvic pain and urgency.  ?Musculoskeletal:  Positive for back pain and gait problem. Negative for  arthralgias, joint swelling, myalgias, neck pain and neck stiffness.  ?Skin: Negative.   ?Allergic/Immunologic: Negative.   ?Neurological:  Positive for tingling. Negative for dizziness, weakness, numbness, headaches and paresthesias.  ?Hematological: Negative.   ?Psychiatric/Behavioral:  Negative for confusion, hallucinations, sleep disturbance and suicidal ideas.   ?All other systems reviewed and are negative. ? ?   ? ?Objective:  ?BP 125/70   Pulse 85   Temp 98 ?F (36.7 ?C)   Ht 5\' 5"  (1.651 m)   Wt 160 lb (72.6 kg)   SpO2 97%   BMI 26.63 kg/m?   ? ?Wt Readings from Last 3 Encounters:  ?09/26/21 160 lb (72.6 kg)  ?08/20/21 161 lb 6.4 oz (73.2 kg)  ?08/06/21 162 lb (73.5 kg)  ? ? ?Physical Exam ?Vitals and nursing note reviewed.  ?Constitutional:   ?   General: He is not in acute distress. ?   Appearance: Normal appearance. He is well-developed and well-groomed. He is not ill-appearing, toxic-appearing or diaphoretic.  ?HENT:  ?   Head: Normocephalic  and atraumatic.  ?   Jaw: There is normal jaw occlusion.  ?   Right Ear: Hearing normal.  ?   Left Ear: Hearing normal.  ?   Nose: Nose normal.  ?   Mouth/Throat:  ?   Lips: Pink.  ?   Mouth: Mucous membranes are moist.  ?   Pharynx: Oropharynx is clear. Uvula midline.  ?Eyes:  ?   General: Lids are normal.  ?   Extraocular Movements: Extraocular movements intact.  ?   Conjunctiva/sclera: Conjunctivae normal.  ?   Pupils: Pupils are equal, round, and reactive to light.  ?Neck:  ?   Thyroid: No thyroid mass, thyromegaly or thyroid tenderness.  ?   Vascular: No carotid bruit or JVD.  ?   Trachea: Trachea and phonation normal.  ?Cardiovascular:  ?   Rate and Rhythm: Normal rate and regular rhythm.  ?   Chest Wall: PMI is not displaced.  ?   Pulses: Normal pulses.  ?   Heart sounds: Normal heart sounds. No murmur heard. ?  No friction rub. No gallop.  ?Pulmonary:  ?   Effort: Pulmonary effort is normal.  ?   Breath sounds: Normal breath sounds.  ?Abdominal:  ?    General: Bowel sounds are normal. There is no abdominal bruit.  ?   Palpations: Abdomen is soft. There is no hepatomegaly or splenomegaly.  ?Musculoskeletal:  ?   Cervical back: Normal, normal range of motion and neck supple

## 2021-10-08 DIAGNOSIS — H401132 Primary open-angle glaucoma, bilateral, moderate stage: Secondary | ICD-10-CM | POA: Diagnosis not present

## 2021-10-09 ENCOUNTER — Ambulatory Visit (INDEPENDENT_AMBULATORY_CARE_PROVIDER_SITE_OTHER): Payer: Medicare Other

## 2021-10-09 ENCOUNTER — Encounter: Payer: Self-pay | Admitting: Family Medicine

## 2021-10-09 ENCOUNTER — Ambulatory Visit (INDEPENDENT_AMBULATORY_CARE_PROVIDER_SITE_OTHER): Payer: Medicare Other | Admitting: Family Medicine

## 2021-10-09 VITALS — BP 145/65 | HR 73 | Temp 97.9°F | Ht 65.0 in | Wt 158.0 lb

## 2021-10-09 DIAGNOSIS — M5442 Lumbago with sciatica, left side: Secondary | ICD-10-CM | POA: Diagnosis not present

## 2021-10-09 DIAGNOSIS — M2578 Osteophyte, vertebrae: Secondary | ICD-10-CM | POA: Diagnosis not present

## 2021-10-09 DIAGNOSIS — M4316 Spondylolisthesis, lumbar region: Secondary | ICD-10-CM | POA: Diagnosis not present

## 2021-10-09 DIAGNOSIS — G8929 Other chronic pain: Secondary | ICD-10-CM

## 2021-10-09 DIAGNOSIS — M545 Low back pain, unspecified: Secondary | ICD-10-CM | POA: Diagnosis not present

## 2021-10-09 MED ORDER — PREDNISONE 10 MG (21) PO TBPK: ORAL_TABLET | ORAL | 0 refills | Status: DC

## 2021-10-09 MED ORDER — CELECOXIB 50 MG PO CAPS: 50.0000 mg | ORAL_CAPSULE | Freq: Two times a day (BID) | ORAL | 0 refills | Status: AC

## 2021-10-09 NOTE — Progress Notes (Signed)
?  ? ?Subjective:  ?Patient ID: Paul ClockBobby L Janssens, male    DOB: 01-20-1926, 88 y.o.   MRN: [id number] ? ?Patient Care Team: ?Gwenlyn FudgeJoyce, Britney F, FNP as PCP - General (Family Medicine)  ? ?Chief Complaint:  Back Pain ? ? ?HPI: ?Paul Bradshaw is a 88 y.o. male presenting on 10/09/2021 for Back Pain ? ? ?Pt presents today with ongoing complaints of lower back pain. No new injuries. No fever, chills, weakness, or loss of function. He was seen in office 2 weeks ago and placed on a short burst of steroids with minimal relief of symptoms. States he needs something to help relieve the pain as he is having a hard time resting.  ? ?Back Pain ?This is a chronic problem. The problem occurs constantly. The pain is present in the lumbar spine, sacro-iliac, thoracic spine and gluteal. The quality of the pain is described as aching and shooting. The pain radiates to the left thigh. Associated symptoms include leg pain and tingling. Pertinent negatives include no abdominal pain, bladder incontinence, bowel incontinence, chest pain, dysuria, fever, headaches, numbness, paresis, paresthesias, pelvic pain, perianal numbness, weakness or weight loss. Risk factors include poor posture. He has tried analgesics for the symptoms. The treatment provided mild relief.  ? ? ?Relevant past medical, surgical, family, and social history reviewed and updated as indicated.  ?Allergies and medications reviewed and updated. Data reviewed: Chart in Epic. ? ? ?Past Medical History:  ?Diagnosis Date  ? Aortic regurgitation   ? Moderate  ? Arthritis   ? BPH (benign prostatic hyperplasia)   ? Cervical disc disease   ? Cervical radiculopathy   ? Hyperlipidemia   ? Hypertension   ? Prediabetes 02/16/2021  ? Staphylococcus aureus bacteremia 08/09/2012  ? TEE negative for vegetation or thrombus February 2014  ? Stroke [facility](HCC) 2005 or 2006  ? 3  ? Symptomatic carotid artery stenosis with infarction [facility](HCC) 2005 or 2006  ? Status post right carotid endarterectomy  ? ? ?Past  Surgical History:  ?Procedure Laterality Date  ? BACK SURGERY    ? CAROTID ENDARTERECTOMY    ? CATARACT EXTRACTION W/PHACO Left 02/15/2015  ? Procedure: CATARACT EXTRACTION PHACO AND INTRAOCULAR LENS PLACEMENT LEFT EYE CDE=30.93;  Surgeon: Gemma PayorKerry Hunt, MD;  Location: [facility]S;  Service: Ophthalmology;  Laterality: Left;  ? CHOLECYSTECTOMY    ? KIDNEY STONE SURGERY    ? LUMBAR LAMINECTOMY/DECOMPRESSION MICRODISCECTOMY Bilateral 01/23/2014  ? Procedure: LUMBAR LAMINECTOMY/DECOMPRESSION MICRODISCECTOMY 1 LEVEL L5-S1;  Surgeon: Temple PaciniHenry A Pool, MD;  Location: [facility];  Service: Neurosurgery;  Laterality: Bilateral;  LUMBAR LAMINECTOMY/DECOMPRESSION MICRODISCECTOMY 1 LEVEL L5-S1  ? TEE WITHOUT CARDIOVERSION N/A 08/12/2012  ? Procedure: TRANSESOPHAGEAL ECHOCARDIOGRAM (TEE);  Surgeon: Wendall StadePeter C Nishan, MD;  Location: [facility];  Service: Cardiovascular;  Laterality: N/A;  ? TEE WITHOUT CARDIOVERSION N/A 06/24/2013  ? Procedure: TRANSESOPHAGEAL ECHOCARDIOGRAM (TEE);  Surgeon: Jonelle SidleSamuel G McDowell, MD;  Location: [facility];  Service: Endoscopy;  Laterality: N/A;  ? ? ?Social History  ? ?Socioeconomic History  ? Marital status: Married  ?  Spouse name: Not on file  ? Number of children: Not on file  ? Years of education: Not on file  ? Highest education level: Not on file  ?Occupational History  ? Not on file  ?Tobacco Use  ? Smoking status: Never  ? Smokeless tobacco: Never  ?Vaping Use  ? Vaping Use: Never used  ?Substance and Sexual Activity  ? Alcohol use: No  ? Drug use: No  ?  Sexual activity: Not Currently  ?Other Topics Concern  ? Not on file  ?Social History Narrative  ? Not on file  ? ?Social Determinants of Health  ? ?Financial Resource Strain: Not on file  ?Food Insecurity: Not on file  ?Transportation Needs: Not on file  ?Physical Activity: Not on file  ?Stress: Not on file  ?Social Connections: Not on file  ?Intimate Partner Violence: Not on file  ? ? ?Outpatient Encounter Medications as of 10/09/2021  ?Medication Sig   ? acetaminophen (TYLENOL) 500 MG tablet Take 1,000 mg by mouth every 8 (eight) hours as needed for moderate pain.  ? albuterol (VENTOLIN HFA) 108 (90 Base) MCG/ACT inhaler Inhale 2 puffs into the lungs every 6 (six) hours as needed for shortness of breath or wheezing.  ? atorvastatin (LIPITOR) 10 MG tablet TAKE ONE-HALF TABLET BY  MOUTH DAILY AT 6 PM  ? celecoxib (CELEBREX) 50 MG capsule Take 1 capsule (50 mg total) by mouth 2 (two) times daily.  ? cetirizine (ZYRTEC) 10 MG tablet Take 10 mg by mouth daily.  ? clopidogrel (PLAVIX) 75 MG tablet TAKE 1 TABLET BY MOUTH  DAILY WITH BREAKFAST  ? dorzolamide-timolol (COSOPT) 22.3-6.8 MG/ML ophthalmic solution SMARTSIG:In Eye(s)  ? finasteride (PROSCAR) 5 MG tablet Take 1 tablet (5 mg total) by mouth daily.  ? Multiple Vitamins-Minerals (MULTIVITAMIN PO) Take 1 tablet by mouth daily.  ? predniSONE (STERAPRED UNI-PAK 21 TAB) 10 MG (21) TBPK tablet Use as directed on back of pill pack  ? tamsulosin (FLOMAX) 0.4 MG CAPS capsule TAKE 1 CAPSULE BY MOUTH IN  THE EVENING  ? Turmeric 1053 MG TABS Take 1 tablet by mouth daily.  ? [DISCONTINUED] predniSONE (DELTASONE) 20 MG tablet 2 po at sametime daily for 5 days- start tomorrow  ? brimonidine (ALPHAGAN) 0.2 % ophthalmic solution SMARTSIG:In Eye(s)  ? ?No facility-administered encounter medications on file as of 10/09/2021.  ? ? ?Allergies  ?Allergen Reactions  ? Amlodipine Swelling  ?  Swelling of lips.  ? Lisinopril Other (See Comments)  ?  Elevated BP higher & causes a burning feeling on the inside.   ? ? ?Review of Systems  ?Constitutional:  Negative for activity change, appetite change, chills, diaphoresis, fatigue, fever, unexpected weight change and weight loss.  ?HENT: Negative.    ?Eyes: Negative.   ?Respiratory:  Negative for cough, chest tightness and shortness of breath.   ?Cardiovascular:  Negative for chest pain, palpitations and leg swelling.  ?Gastrointestinal:  Negative for abdominal pain, blood in stool, bowel  incontinence, constipation, diarrhea, nausea and vomiting.  ?Endocrine: Negative.   ?Genitourinary:  Negative for bladder incontinence, decreased urine volume, difficulty urinating, dysuria, flank pain, frequency, hematuria, pelvic pain and urgency.  ?Musculoskeletal:  Positive for arthralgias, back pain and gait problem. Negative for joint swelling, myalgias, neck pain and neck stiffness.  ?Skin: Negative.   ?Allergic/Immunologic: Negative.   ?Neurological:  Positive for tingling. Negative for dizziness, weakness, numbness, headaches and paresthesias.  ?Hematological: Negative.   ?Psychiatric/Behavioral:  Negative for confusion, hallucinations, sleep disturbance and suicidal ideas.   ?All other systems reviewed and are negative. ? ?   ? ?Objective:  ?BP (!) 145/65   Pulse 73   Temp 97.9 ?F (36.6 ?C)   Ht 5\' 5"  (1.651 m)   Wt 158 lb (71.7 kg)   SpO2 96%   BMI 26.29 kg/m?   ? ?Wt Readings from Last 3 Encounters:  ?10/09/21 158 lb (71.7 kg)  ?09/26/21 160 lb (72.6 kg)  ?08/20/21 161  lb 6.4 oz (73.2 kg)  ? ? ?Physical Exam ?Vitals and nursing note reviewed.  ?Constitutional:   ?   General: He is not in acute distress. ?   Appearance: He is not ill-appearing, toxic-appearing or diaphoretic.  ?   Comments: Frail elderly  ?HENT:  ?   Head: Normocephalic and atraumatic.  ?   Mouth/Throat:  ?   Mouth: Mucous membranes are moist.  ?Eyes:  ?   Pupils: Pupils are equal, round, and reactive to light.  ?Cardiovascular:  ?   Rate and Rhythm: Normal rate and regular rhythm.  ?   Heart sounds: Normal heart sounds.  ?Pulmonary:  ?   Effort: Pulmonary effort is normal.  ?   Breath sounds: Normal breath sounds.  ?Musculoskeletal:  ?   Cervical back: Normal, normal range of motion and neck supple.  ?   Thoracic back: Tenderness present. No swelling, edema, deformity, signs of trauma, lacerations, spasms or bony tenderness. Decreased range of motion. Scoliosis present.  ?   Lumbar back: Tenderness present. No swelling, edema,  deformity, signs of trauma, lacerations, spasms or bony tenderness. Decreased range of motion. Positive left straight leg raise test. Negative right straight leg raise test. Scoliosis present.  ?   Right hip: No

## 2021-10-17 ENCOUNTER — Ambulatory Visit (INDEPENDENT_AMBULATORY_CARE_PROVIDER_SITE_OTHER): Payer: Medicare Other | Admitting: Family Medicine

## 2021-10-17 ENCOUNTER — Encounter: Payer: Self-pay | Admitting: Family Medicine

## 2021-10-17 ENCOUNTER — Ambulatory Visit (INDEPENDENT_AMBULATORY_CARE_PROVIDER_SITE_OTHER): Payer: Medicare Other

## 2021-10-17 VITALS — BP 177/84 | HR 79 | Temp 97.9°F | Ht 65.0 in | Wt 160.0 lb

## 2021-10-17 DIAGNOSIS — M545 Low back pain, unspecified: Secondary | ICD-10-CM

## 2021-10-17 DIAGNOSIS — G8911 Acute pain due to trauma: Secondary | ICD-10-CM

## 2021-10-17 MED ORDER — TRAMADOL HCL 50 MG PO TABS: 50.0000 mg | ORAL_TABLET | Freq: Three times a day (TID) | ORAL | 0 refills | Status: AC | PRN

## 2021-10-17 NOTE — Progress Notes (Signed)
? ?Assessment & Plan:  ?1. Acute low back pain due to trauma ?- DG Lumbar Spine 2-3 Views ?- traMADol (ULTRAM) 50 MG tablet; Take 1 tablet (50 mg total) by mouth every 8 (eight) hours as needed for up to 5 days.  Dispense: 15 tablet; Refill: 0 ? ? ?Follow up plan: Return if symptoms worsen or fail to improve. ? ?Hendricks Limes, MSN, APRN, FNP-C ?[place] ? ?Subjective:  ? ?Patient ID: ARTA BAZ, male    DOB: [birth year], 88 y.o.   MRN: [id number] ? ?HPI: ?GERELL DIMITRI is a 88 y.o. male presenting on 10/17/2021 for Fall (Patient had a fall on Sunday or monday. C/O lower back pain & bilateral hip pain) ? ?Patient reports he fell either 3 or 4 days ago and has been having lower back and bilateral hip pain since. He reports feeling dizzy x2 days prior to the fall.  Reports he felt himself falling but could not stop it.  He even tried to put his head against the wall to keep him from going down.  He describes the pain as sore and aching.  After this he started taking OTC medication for motion sickness and reports it resolved the dizziness.  He has been taking extra strength Tylenol and Celebrex for his pain, which has not been effective. ? ? ?ROS: Negative unless specifically indicated above in HPI.  ? ?Relevant past medical history reviewed and updated as indicated.  ? ?Allergies and medications reviewed and updated. ? ? ?Current Outpatient Medications:  ?  acetaminophen (TYLENOL) 500 MG tablet, Take 1,000 mg by mouth every 8 (eight) hours as needed for moderate pain., Disp: , Rfl:  ?  albuterol (VENTOLIN HFA) 108 (90 Base) MCG/ACT inhaler, Inhale 2 puffs into the lungs every 6 (six) hours as needed for shortness of breath or wheezing., Disp: 18 g, Rfl: 0 ?  atorvastatin (LIPITOR) 10 MG tablet, TAKE ONE-HALF TABLET BY  MOUTH DAILY AT 6 PM, Disp: 45 tablet, Rfl: 1 ?  brimonidine (ALPHAGAN) 0.2 % ophthalmic solution, SMARTSIG:In Eye(s), Disp: , Rfl:  ?  celecoxib (CELEBREX) 50 MG capsule, Take 1  capsule (50 mg total) by mouth 2 (two) times daily., Disp: 180 each, Rfl: 0 ?  cetirizine (ZYRTEC) 10 MG tablet, Take 10 mg by mouth daily., Disp: , Rfl:  ?  clopidogrel (PLAVIX) 75 MG tablet, TAKE 1 TABLET BY MOUTH  DAILY WITH BREAKFAST, Disp: 90 tablet, Rfl: 1 ?  dorzolamide-timolol (COSOPT) 22.3-6.8 MG/ML ophthalmic solution, SMARTSIG:In Eye(s), Disp: , Rfl:  ?  finasteride (PROSCAR) 5 MG tablet, Take 1 tablet (5 mg total) by mouth daily., Disp: 90 tablet, Rfl: 3 ?  Multiple Vitamins-Minerals (MULTIVITAMIN PO), Take 1 tablet by mouth daily., Disp: , Rfl:  ?  tamsulosin (FLOMAX) 0.4 MG CAPS capsule, TAKE 1 CAPSULE BY MOUTH IN  THE EVENING, Disp: 90 capsule, Rfl: 1 ?  Turmeric 1053 MG TABS, Take 1 tablet by mouth daily., Disp: , Rfl:  ? ?Allergies  ?Allergen Reactions  ? Amlodipine Swelling  ?  Swelling of lips.  ? Lisinopril Other (See Comments)  ?  Elevated BP higher & causes a burning feeling on the inside.   ? ? ?Objective:  ? ?BP (!) 177/84   Pulse 79   Temp 97.9 ?F (36.6 ?C) (Temporal)   Ht 5\' 5"  (1.651 m)   Wt 160 lb (72.6 kg)   BMI 26.63 kg/m?   ? ?Physical Exam ?Vitals reviewed.  ?Constitutional:   ?  General: He is not in acute distress. ?   Appearance: Normal appearance. He is not ill-appearing, toxic-appearing or diaphoretic.  ?HENT:  ?   Head: Normocephalic and atraumatic.  ?Eyes:  ?   General: No scleral icterus.    ?   Right eye: No discharge.     ?   Left eye: No discharge.  ?   Conjunctiva/sclera: Conjunctivae normal.  ?Cardiovascular:  ?   Rate and Rhythm: Normal rate.  ?Pulmonary:  ?   Effort: Pulmonary effort is normal. No respiratory distress.  ?Musculoskeletal:     ?   General: Normal range of motion.  ?   Cervical back: Normal range of motion.  ?   Lumbar back: Tenderness and bony tenderness present. Negative right straight leg raise test and negative left straight leg raise test.  ?   Right hip: Normal.  ?   Left hip: Normal.  ?Skin: ?   General: Skin is warm and dry.  ?Neurological:   ?   Mental Status: He is alert and oriented to person, place, and time. Mental status is at baseline.  ?   Gait: Gait abnormal (ambulating with cane initially but moved to wheelchair due to pain).  ?Psychiatric:     ?   Mood and Affect: Mood normal.     ?   Behavior: Behavior normal.     ?   Thought Content: Thought content normal.     ?   Judgment: Judgment normal.  ? ? ? ? ? ? ?

## 2021-10-21 ENCOUNTER — Encounter: Payer: Self-pay | Admitting: Urology

## 2021-10-21 ENCOUNTER — Ambulatory Visit (INDEPENDENT_AMBULATORY_CARE_PROVIDER_SITE_OTHER): Payer: Medicare Other | Admitting: Urology

## 2021-10-21 ENCOUNTER — Telehealth: Payer: Self-pay | Admitting: Family Medicine

## 2021-10-21 VITALS — BP 102/62 | HR 139

## 2021-10-21 DIAGNOSIS — G8929 Other chronic pain: Secondary | ICD-10-CM

## 2021-10-21 DIAGNOSIS — R310 Gross hematuria: Secondary | ICD-10-CM

## 2021-10-21 DIAGNOSIS — Z9181 History of falling: Secondary | ICD-10-CM

## 2021-10-21 DIAGNOSIS — N138 Other obstructive and reflux uropathy: Secondary | ICD-10-CM | POA: Diagnosis not present

## 2021-10-21 DIAGNOSIS — N401 Enlarged prostate with lower urinary tract symptoms: Secondary | ICD-10-CM

## 2021-10-21 DIAGNOSIS — R3912 Poor urinary stream: Secondary | ICD-10-CM

## 2021-10-21 LAB — URINALYSIS, ROUTINE W REFLEX MICROSCOPIC
Bilirubin, UA: NEGATIVE
Glucose, UA: NEGATIVE
Ketones, UA: NEGATIVE
Leukocytes,UA: NEGATIVE
Nitrite, UA: NEGATIVE
Protein,UA: NEGATIVE
RBC, UA: NEGATIVE
Specific Gravity, UA: 1.02 (ref 1.005–1.030)
Urobilinogen, Ur: 0.2 mg/dL (ref 0.2–1.0)
pH, UA: 6.5 (ref 5.0–7.5)

## 2021-10-21 MED ORDER — CIPROFLOXACIN HCL 500 MG PO TABS
500.0000 mg | ORAL_TABLET | Freq: Once | ORAL | Status: AC
  Administered 2021-10-21: 500 mg via ORAL

## 2021-10-21 NOTE — Progress Notes (Signed)
? ?  10/21/21 ? ?CC: BPH, gross hematuria  ? ?HPI: ?F/u -  ? ?1) BPH with lower urine tract symptoms-prostate about 55 g on imaging. He started finasteride Feb 2023 in addition to tamsulosin. No prostate surgery. He has some urgency. Sometimes incontinence. He admits he'll work half a day and drink no water, but has gotten better about drinking water and juice recently. ? ?  ?2) gross hematuria - noted Nov 2022 - he passed a clot after taking "two aspirin". Associated with positive urine culture for E. coli in November 2022 in January 2023.  Creatinine is normal at 0.78.  CT scan of the abdomen and pelvis was obtained November 2022 which showed no stone or hydronephrosis.  Prostate was about 55 g.  His PSA was 2.0 back in 2021. DRE benign Feb 2023.  ?  ?He is on clopidogrel. ?  ?Today, seen for the above and for cystoscopy. 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 He is voiding aqequately. No further incontinence or UTI. No gross hematuria. He fell and complains of low back pain radiating down into the backs of both legs. He had l-spine xrays 10/17/2021.  ? ? ?Blood pressure 102/62, pulse (!) 139. ?NED. A&Ox3. He is ambulating with his cane ?No respiratory distress   ?Abd soft, NT, ND ?Normal phallus, uncircumcised with bilateral descended testicles ? ?Cystoscopy Procedure Note ? ?Patient identification was confirmed, informed consent was obtained, and patient was prepped using Betadine solution.  Lidocaine jelly was administered per urethral meatus.   ? ? ?Pre-Procedure: ?- Inspection reveals a normal caliber ureteral meatus. ? ?Procedure: ?The flexible cystoscope was introduced without difficulty ?- No urethral strictures/lesions are present. ?- Enlarged prostate with lateral lone obstruction  ?- normal bladder neck ?- Bilateral ureteral orifices identified ?- Bladder mucosa  reveals no ulcers, tumors, or lesions ?- No bladder stones ?- moderate trabeculation ? ?Retroflexion shows normal bladder and bladder  neck. Minimal intravesical prostate.  ? ?He was given an abx x 1 ? ? ?Post-Procedure: ?- Patient tolerated the procedure well ? ?Assessment/ Plan: ? ?1) gross hematuria- benign eval  ? ?2) BPH - cont tams and finasteride . I told him to let NP Blanch Media know he's still having a lot of pain (he asked if I could send him to the hospital for a "scan").  ? ?No follow-ups on file. ? ?Festus Aloe, MD  ? ?

## 2021-10-22 NOTE — Telephone Encounter (Signed)
MRI ordered. Someone will call him to schedule.  ?

## 2021-10-22 NOTE — Telephone Encounter (Signed)
lm

## 2021-10-25 DIAGNOSIS — H401134 Primary open-angle glaucoma, bilateral, indeterminate stage: Secondary | ICD-10-CM | POA: Diagnosis not present

## 2021-11-08 ENCOUNTER — Ambulatory Visit (HOSPITAL_COMMUNITY)
Admission: RE | Admit: 2021-11-08 | Discharge: 2021-11-08 | Disposition: A | Discharge status: Home / Self Care | Payer: Medicare Other | Source: Ambulatory Visit | Attending: Family Medicine | Admitting: Family Medicine

## 2021-11-08 DIAGNOSIS — G8929 Other chronic pain: Secondary | ICD-10-CM | POA: Diagnosis not present

## 2021-11-08 DIAGNOSIS — M545 Low back pain, unspecified: Secondary | ICD-10-CM | POA: Diagnosis not present

## 2021-11-08 DIAGNOSIS — Z9181 History of falling: Secondary | ICD-10-CM | POA: Insufficient documentation

## 2021-11-08 DIAGNOSIS — M4316 Spondylolisthesis, lumbar region: Secondary | ICD-10-CM | POA: Diagnosis not present

## 2021-11-13 ENCOUNTER — Other Ambulatory Visit: Payer: Self-pay

## 2021-11-13 DIAGNOSIS — M48061 Spinal stenosis, lumbar region without neurogenic claudication: Secondary | ICD-10-CM

## 2021-11-19 ENCOUNTER — Ambulatory Visit (INDEPENDENT_AMBULATORY_CARE_PROVIDER_SITE_OTHER): Payer: Medicare Other | Admitting: Family Medicine

## 2021-11-19 ENCOUNTER — Encounter: Payer: Self-pay | Admitting: Family Medicine

## 2021-11-19 VITALS — BP 162/78 | HR 92 | Temp 98.0°F | Ht 65.0 in | Wt 165.0 lb

## 2021-11-19 DIAGNOSIS — Z79899 Other long term (current) drug therapy: Secondary | ICD-10-CM | POA: Diagnosis not present

## 2021-11-19 DIAGNOSIS — G8929 Other chronic pain: Secondary | ICD-10-CM

## 2021-11-19 DIAGNOSIS — M5442 Lumbago with sciatica, left side: Secondary | ICD-10-CM | POA: Diagnosis not present

## 2021-11-19 DIAGNOSIS — M5441 Lumbago with sciatica, right side: Secondary | ICD-10-CM

## 2021-11-19 DIAGNOSIS — M48061 Spinal stenosis, lumbar region without neurogenic claudication: Secondary | ICD-10-CM

## 2021-11-19 MED ORDER — HYDROCODONE-ACETAMINOPHEN 5-325 MG PO TABS: 1.0000 | ORAL_TABLET | Freq: Three times a day (TID) | ORAL | 0 refills | Status: DC | PRN

## 2021-11-19 NOTE — Progress Notes (Signed)
Assessment & Plan:  1-3. Chronic midline low back pain with bilateral sciatica/Spinal stenosis of lumbar region without neurogenic claudication/Controlled substance agreement signed Uncontrolled. Starting Norco today. Controlled substance agreement signed today. Urine drug screen collected today. PDMP reviewed with no concerning findings. Patient has previously been referred to [facility] and is awaiting an appointment.  - HYDROcodone-acetaminophen (NORCO) 5-325 MG tablet; Take 1 tablet by mouth every 8 (eight) hours as needed for moderate pain.  Dispense: 90 tablet; Refill: 0 - ToxASSU[employer] Select 13 (MW), Urine   Follow up plan: Return in about 4 weeks (around 12/17/2021) for Pain.  Deliah Boston, MSN, APRN, FNP-C [facility]  Subjective:   Patient ID: Paul Bradshaw, male    DOB: Jan 13, 1926, 88 y.o.   MRN: [id number]  HPI: Paul Bradshaw is a 88 y.o. male presenting on 11/19/2021 for Back Pain (Lower back.)  [facility] Controlled Substance Abuse database reviewed: Yes If yes- were their any concerning findings: No     10/09/2021    3:16 PM 09/26/2021   11:49 AM 02/13/2021   11:00 AM 05/08/2020   11:20 AM 10/11/2019    3:04 PM  Depression screen PHQ 2/9  Decreased Interest 0 0 0 0 0  Down, Depressed, Hopeless 0 0 0 0 0  PHQ - 2 Score 0 0 0 0 0  Altered sleeping 0  0    Tired, decreased energy 0  0    Change in appetite 0  0    Feeling bad or failure about yourself  0  0    Trouble concentrating 0  0    Moving slowly or fidgety/restless 0  0    Suicidal thoughts 0  0    PHQ-9 Score 0  0    Difficult doing work/chores Not difficult at all  Not difficult at all        10/09/2021    3:18 PM 02/13/2021   11:00 AM  GAD 7 : Generalized Anxiety Score  Nervous, Anxious, on Edge 0 0  Control/stop worrying 0 0  Worry too much - different things 0 0  Trouble relaxing 0 0  Restless 0 0  Easily annoyed or irritable 0 0  Afraid - awful might  happen 0 0  Total GAD 7 Score 0 0  Anxiety Difficulty Not difficult at all Not difficult at all   [employer] drug screen performed- Yes  SOAPP  0= never  1= seldom  2=sometimes  3= often  4= very often  How often do you have mood swings? 0 How often do you smoke a cigarette within an hour after waking up? 0 How often have you taken medication other than the way that it was prescribed? 0 How often have you used illegal drugs in the past 5 years? 0 How often, in your lifetime, have you had legal problems or been arrested? 0  Score 0  Alcohol Audit - How often during the last year have found that you: 0-Never   1- Less than monthly   2- Monthly     3-Weekly     4-daily or almost daily  - found that you were not able to stop drinking once you started- 0 - failed to do what was normally expected of you because of drinking- 0 - needed a first drink in the morning- 0 - had a feeling of guilt or remorse after drinking- 0 - are/were unable to remember what happened the night before because  of your drinking- 0  0- NO   2- yes but not in last year  4- yes during last year - Have you or someone else been injured because of your drinking- 0 - Has anyone been concerned about your drinking or suggested you cut down- 0        TOTAL- 0  ( 0-7- alcohol education, 8-15- simple advice, 16-19 simple advice plus counseling, 20-40 referral for evaluation and treatment )     11/19/2021   11:06 AM  Opioid Risk   Alcohol 0  Illegal Drugs 0  Rx Drugs 0  Alcohol 3  Illegal Drugs 0  Rx Drugs 0  Age between 16-45 years  0  History of Preadolescent Sexual Abuse 0  Psychological Disease 0  Depression 0  Opioid Risk Tool Scoring 3  Opioid Risk Interpretation Low Risk   Designated Pharmacy: [facility]  Pain assessment: Cause of pain-  MRI lumbar spine 11/08/2021: 1. 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 Mild degenerative endplate marrow edema L3-L4 and L5-S1. 2.  Progressed chronic severe disc space loss at both L3-L4 and L4-L5 since 2015. Previous posterior decompression at both levels with no significant spinal stenosis, but severe bilateral L3 and L4 neural foraminal stenosis appears somewhat progressed. 3. Interval postoperative changes at L5-S1 with no spinal stenosis but similar progressed moderate to severe bilateral L5 foraminal stenosis since 2015.  Pain location- back Pain on scale of 1-10- 9/10 Frequency- daily What increases pain-activity, twisting, reaching quickly What makes pain better- medication, rest, leg braces Effects on ADL - makes very difficult  Prior treatments tried and failed- Extra strength Tylenol, Celebrex, and Tramadol Current opioids rx- Norco 5/325 Q8H PRN # prescribed- 90 Morphine mg equivalent- 15 MME/day  Pain management agreement reviewed and signed- Yes   ROS: Negative unless specifically indicated above in HPI.   Relevant past medical history reviewed and updated as indicated.   Allergies and medications reviewed and updated.   Current Outpatient Medications:    acetaminophen (TYLENOL) 500 MG tablet, Take 1,000 mg by mouth every 8 (eight) hours as needed for moderate pain., Disp: , Rfl:    albuterol (VENTOLIN HFA) 108 (90 Base) MCG/ACT inhaler, Inhale 2 puffs into the lungs every 6 (six) hours as needed for shortness of breath or wheezing., Disp: 18 g, Rfl: 0   atorvastatin (LIPITOR) 10 MG tablet, TAKE ONE-HALF TABLET BY  MOUTH DAILY AT 6 PM, Disp: 45 tablet, Rfl: 1   brimonidine (ALPHAGAN) 0.2 % ophthalmic solution, SMARTSIG:In Eye(s), Disp: , Rfl:    celecoxib (CELEBREX) 50 MG capsule, Take 1 capsule (50 mg total) by mouth 2 (two) times daily., Disp: 180 each, Rfl: 0   cetirizine (ZYRTEC) 10 MG tablet, Take 10 mg by mouth daily., Disp: , Rfl:    clopidogrel (PLAVIX) 75 MG tablet, TAKE 1 TABLET BY MOUTH  DAILY WITH BREAKFAST, Disp: 90 tablet, Rfl: 1   dorzolamide-timolol (COSOPT) 22.3-6.8 MG/ML  ophthalmic solution, SMARTSIG:In Eye(s), Disp: , Rfl:    finasteride (PROSCAR) 5 MG tablet, Take 1 tablet (5 mg total) by mouth daily., Disp: 90 tablet, Rfl: 3   Multiple Vitamins-Minerals (MULTIVITAMIN PO), Take 1 tablet by mouth daily., Disp: , Rfl:    tamsulosin (FLOMAX) 0.4 MG CAPS capsule, TAKE 1 CAPSULE BY MOUTH IN  THE EVENING, Disp: 90 capsule, Rfl: 1   Turmeric 1053 MG TABS, Take 1 tablet by mouth daily., Disp: , Rfl:   Allergies  Allergen Reactions   Amlodipine Swelling  Swelling of lips.   Lisinopril Other (See Comments)    Elevated BP higher & causes a burning feeling on the inside.     Objective:   BP (!) 162/78   Pulse 92   Temp 98 F (36.7 C)   Ht 5\' 5"  (1.651 m)   Wt 165 lb (74.8 kg)   SpO2 97%   BMI 27.46 kg/m    Physical Exam Vitals reviewed.  Constitutional:      General: He is not in acute distress.    Appearance: Normal appearance. He is not ill-appearing, toxic-appearing or diaphoretic.  HENT:     Head: Normocephalic and atraumatic.  Eyes:     General: No scleral icterus.       Right eye: No discharge.        Left eye: No discharge.     Conjunctiva/sclera: Conjunctivae normal.  Cardiovascular:     Rate and Rhythm: Normal rate.  Pulmonary:     Effort: Pulmonary effort is normal. No respiratory distress.  Musculoskeletal:        General: Normal range of motion.     Cervical back: Normal range of motion.  Skin:    General: Skin is warm and dry.  Neurological:     Mental Status: He is alert and oriented to person, place, and time. Mental status is at baseline.     Gait: Gait abnormal ([facility]  Psychiatric:        Mood and Affect: Mood normal.        Behavior: Behavior normal.        Thought Content: Thought content normal.        Judgment: Judgment normal.

## 2021-11-22 LAB — TOXASSURE SELECT 13 (MW), URINE

## 2021-12-13 ENCOUNTER — Other Ambulatory Visit: Payer: Self-pay

## 2021-12-13 ENCOUNTER — Encounter (HOSPITAL_COMMUNITY): Payer: Self-pay

## 2021-12-13 ENCOUNTER — Emergency Department (HOSPITAL_COMMUNITY)
Admission: EM | Admit: 2021-12-13 | Discharge: 2021-12-13 | Disposition: A | Discharge status: Home / Self Care | Payer: Medicare Other | Attending: Emergency Medicine | Admitting: Emergency Medicine

## 2021-12-13 DIAGNOSIS — Z7901 Long term (current) use of anticoagulants: Secondary | ICD-10-CM | POA: Insufficient documentation

## 2021-12-13 DIAGNOSIS — R350 Frequency of micturition: Secondary | ICD-10-CM | POA: Diagnosis not present

## 2021-12-13 LAB — URINALYSIS, ROUTINE W REFLEX MICROSCOPIC
Bilirubin Urine: NEGATIVE
Glucose, UA: NEGATIVE mg/dL
Hgb urine dipstick: NEGATIVE
Ketones, ur: 5 mg/dL — AB
Leukocytes,Ua: NEGATIVE
Nitrite: NEGATIVE
Protein, ur: NEGATIVE mg/dL
Specific Gravity, Urine: 1.011 (ref 1.005–1.030)
pH: 6 (ref 5.0–8.0)

## 2021-12-13 LAB — BASIC METABOLIC PANEL
Anion gap: 9 (ref 5–15)
BUN: 12 mg/dL (ref 8–23)
CO2: 26 mmol/L (ref 22–32)
Calcium: 9.1 mg/dL (ref 8.9–10.3)
Chloride: 104 mmol/L (ref 98–111)
Creatinine, Ser: 0.88 mg/dL (ref 0.61–1.24)
GFR, Estimated: >60 mL/min (ref >60)
Glucose, Bld: 113 mg/dL — ABNORMAL HIGH (ref 70–99)
Potassium: 4.3 mmol/L (ref 3.5–5.1)
Sodium: 139 mmol/L (ref 135–145)

## 2021-12-13 LAB — CBC WITH DIFFERENTIAL/PLATELET
Abs Immature Granulocytes: 0.04 10*3/uL (ref 0.00–0.07)
Basophils Absolute: 0 10*3/uL (ref 0.0–0.1)
Basophils Relative: 0 %
Eosinophils Absolute: 0.1 10*3/uL (ref 0.0–0.5)
Eosinophils Relative: 1 %
HCT: 38.5 % — ABNORMAL LOW (ref 39.0–52.0)
Hemoglobin: 12.7 g/dL — ABNORMAL LOW (ref 13.0–17.0)
Immature Granulocytes: 0 %
Lymphocytes Relative: 20 %
Lymphs Abs: 2.1 10*3/uL (ref 0.7–4.0)
MCH: 32.3 pg (ref 26.0–34.0)
MCHC: 33 g/dL (ref 30.0–36.0)
MCV: 98 fL (ref 80.0–100.0)
Monocytes Absolute: 1 10*3/uL (ref 0.1–1.0)
Monocytes Relative: 10 %
Neutro Abs: 7 10*3/uL (ref 1.7–7.7)
Neutrophils Relative %: 69 %
Platelets: 221 10*3/uL (ref 150–400)
RBC: 3.93 MIL/uL — ABNORMAL LOW (ref 4.22–5.81)
RDW: 13.2 % (ref 11.5–15.5)
WBC: 10.2 10*3/uL (ref 4.0–10.5)
nRBC: 0 % (ref 0.0–0.2)

## 2021-12-13 NOTE — ED Notes (Signed)
Bladder scan post void was 87ml.

## 2021-12-17 ENCOUNTER — Ambulatory Visit (INDEPENDENT_AMBULATORY_CARE_PROVIDER_SITE_OTHER): Payer: Medicare Other | Admitting: Family Medicine

## 2021-12-17 ENCOUNTER — Encounter: Payer: Self-pay | Admitting: Family Medicine

## 2021-12-17 VITALS — BP 140/79 | HR 92 | Temp 98.0°F

## 2021-12-17 DIAGNOSIS — M48061 Spinal stenosis, lumbar region without neurogenic claudication: Secondary | ICD-10-CM

## 2021-12-17 DIAGNOSIS — M5441 Lumbago with sciatica, right side: Secondary | ICD-10-CM

## 2021-12-17 DIAGNOSIS — M5442 Lumbago with sciatica, left side: Secondary | ICD-10-CM | POA: Diagnosis not present

## 2021-12-17 DIAGNOSIS — M542 Cervicalgia: Secondary | ICD-10-CM

## 2021-12-17 DIAGNOSIS — Z79899 Other long term (current) drug therapy: Secondary | ICD-10-CM

## 2021-12-17 DIAGNOSIS — G8929 Other chronic pain: Secondary | ICD-10-CM | POA: Diagnosis not present

## 2021-12-17 MED ORDER — HYDROCODONE-ACETAMINOPHEN 7.5-325 MG PO TABS: 1.0000 | ORAL_TABLET | Freq: Four times a day (QID) | ORAL | 0 refills | Status: DC | PRN

## 2021-12-18 DIAGNOSIS — H401132 Primary open-angle glaucoma, bilateral, moderate stage: Secondary | ICD-10-CM | POA: Diagnosis not present

## 2021-12-18 DIAGNOSIS — H401134 Primary open-angle glaucoma, bilateral, indeterminate stage: Secondary | ICD-10-CM | POA: Diagnosis not present

## 2022-01-01 DIAGNOSIS — M5416 Radiculopathy, lumbar region: Secondary | ICD-10-CM | POA: Diagnosis not present

## 2022-01-13 DIAGNOSIS — H401114 Primary open-angle glaucoma, right eye, indeterminate stage: Secondary | ICD-10-CM | POA: Diagnosis not present

## 2022-01-14 ENCOUNTER — Ambulatory Visit (INDEPENDENT_AMBULATORY_CARE_PROVIDER_SITE_OTHER): Payer: Medicare Other | Admitting: Family Medicine

## 2022-01-14 ENCOUNTER — Encounter: Payer: Self-pay | Admitting: Family Medicine

## 2022-01-14 DIAGNOSIS — G8929 Other chronic pain: Secondary | ICD-10-CM | POA: Diagnosis not present

## 2022-01-14 DIAGNOSIS — M5441 Lumbago with sciatica, right side: Secondary | ICD-10-CM

## 2022-01-14 DIAGNOSIS — M5442 Lumbago with sciatica, left side: Secondary | ICD-10-CM

## 2022-01-14 DIAGNOSIS — M48061 Spinal stenosis, lumbar region without neurogenic claudication: Secondary | ICD-10-CM

## 2022-01-14 DIAGNOSIS — Z79899 Other long term (current) drug therapy: Secondary | ICD-10-CM | POA: Diagnosis not present

## 2022-01-14 MED ORDER — HYDROCODONE-ACETAMINOPHEN 7.5-325 MG PO TABS: 1.0000 | ORAL_TABLET | Freq: Four times a day (QID) | ORAL | 0 refills | Status: DC | PRN

## 2022-01-14 MED ORDER — HYDROCODONE-ACETAMINOPHEN 7.5-325 MG PO TABS
1.0000 | ORAL_TABLET | Freq: Four times a day (QID) | ORAL | 0 refills | Status: DC | PRN
Start: 2022-03-15 — End: 2022-03-13

## 2022-01-14 NOTE — Patient Instructions (Signed)
Call [facility] to see about getting next appointment scheduled.

## 2022-01-14 NOTE — Progress Notes (Signed)
Assessment & Plan:  1-3. Chronic midline low back pain with bilateral sciatica/Spinal stenosis of lumbar region without neurogenic claudication/Controlled substance agreement signed Uncontrolled. Encouraged to take Norco more routinely for pain control instead of taking extra Tylenol. Advised to call neurosurgeon office to get next appointment scheduled for back injection. Controlled substance agreement in place. Urine drug screen as expected. PDMP reviewed with no concerning findings.  - HYDROcodone-acetaminophen (NORCO) 7.5-325 MG tablet; Take 1 tablet by mouth every 6 (six) hours as needed for moderate pain.  Dispense: 90 tablet; Refill: 0 - HYDROcodone-acetaminophen (NORCO) 7.5-325 MG tablet; Take 1 tablet by mouth every 6 (six) hours as needed for moderate pain.  Dispense: 90 tablet; Refill: 0 - HYDROcodone-acetaminophen (NORCO) 7.5-325 MG tablet; Take 1 tablet by mouth every 6 (six) hours as needed for moderate pain.  Dispense: 90 tablet; Refill: 0   Return in about 3 months (around 04/16/2022) for follow-up of chronic medication conditions with anybody.  Deliah Boston, MSN, APRN, FNP-C [facility]  Subjective:    Patient ID: Paul Bradshaw, male    DOB: 02-25-1926, 88 y.o.   MRN: [id number]  Patient Care Team: Gwenlyn Fudge, FNP as PCP - General (Family Medicine)   Chief Complaint:  Chief Complaint  Patient presents with   Pain    4 week follow up- chronic midline low back pain with sciatica- patient states it is no better.     HPI: Paul Bradshaw is a 88 y.o. male presenting on 01/14/2022 for Pain (4 week follow up- chronic midline low back pain with sciatica- patient states it is no better. )  Pain assessment: Cause of pain-  MRI lumbar spine 11/08/2021: 1. 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 Mild degenerative endplate marrow edema L3-L4 and L5-S1. 2. Progressed chronic severe disc space loss at both L3-L4 and L4-L5 since  2015. Previous posterior decompression at both levels with no significant spinal stenosis, but severe bilateral L3 and L4 neural foraminal stenosis appears somewhat progressed. 3. Interval postoperative changes at L5-S1 with no spinal stenosis but similar progressed moderate to severe bilateral L5 foraminal stenosis since 2015. Pain location- back Pain on scale of 1-10- 9/10 Frequency- daily What increases pain-activity, twisting, reaching quickly What makes pain better- medication, rest, leg braces Effects on ADL - makes very difficult Any change in general medical condition- no. He reports he is going to get a back injection with the neurosurgeon, but this is not yet scheduled as the provider is out of town.  Current opioids rx- Norco 7.5/325 Q8H PRN # meds rx- 90 Effectiveness of current meds- a little helpful with extra Tylenol Adverse reactions from pain meds- none Morphine equivalent- 30 MME/day  Pill count performed-No Last drug screen - 11/19/2021 ( high risk q50m, moderate risk q20m, low risk yearly ) Urine drug screen today- No Was the N[facility] reviewed- Yes  If yes were their any concerning findings? - No  Overdose risk: 170     11/19/2021   11:06 AM  Opioid Risk   Alcohol 0  Illegal Drugs 0  Rx Drugs 0  Alcohol 3  Illegal Drugs 0  Rx Drugs 0  Age between 16-45 years  0  History of Preadolescent Sexual Abuse 0  Psychological Disease 0  Depression 0  Opioid Risk Tool Scoring 3  Opioid Risk Interpretation Low Risk   Pain contract signed on: 11/19/2021  New complaints: None   Social history:  Relevant past medical, surgical, family and social  history reviewed and updated as indicated. Interim medical history since our last visit reviewed.  Allergies and medications reviewed and updated.  DATA REVIEWED: CHART IN EPIC  ROS: Negative unless specifically indicated above in HPI.    Current Outpatient Medications:    acetaminophen (TYLENOL) 500 MG tablet, Take  1,000 mg by mouth every 8 (eight) hours as needed for moderate pain., Disp: , Rfl:    albuterol (VENTOLIN HFA) 108 (90 Base) MCG/ACT inhaler, Inhale 2 puffs into the lungs every 6 (six) hours as needed for shortness of breath or wheezing., Disp: 18 g, Rfl: 0   atorvastatin (LIPITOR) 10 MG tablet, TAKE ONE-HALF TABLET BY  MOUTH DAILY AT 6 PM, Disp: 45 tablet, Rfl: 1   brimonidine (ALPHAGAN) 0.2 % ophthalmic solution, SMARTSIG:In Eye(s), Disp: , Rfl:    celecoxib (CELEBREX) 50 MG capsule, Take 50 mg by mouth 2 (two) times daily., Disp: , Rfl:    cetirizine (ZYRTEC) 10 MG tablet, Take 10 mg by mouth daily., Disp: , Rfl:    clopidogrel (PLAVIX) 75 MG tablet, TAKE 1 TABLET BY MOUTH  DAILY WITH BREAKFAST, Disp: 90 tablet, Rfl: 1   dorzolamide-timolol (COSOPT) 22.3-6.8 MG/ML ophthalmic solution, SMARTSIG:In Eye(s), Disp: , Rfl:    finasteride (PROSCAR) 5 MG tablet, Take 1 tablet (5 mg total) by mouth daily., Disp: 90 tablet, Rfl: 3   HYDROcodone-acetaminophen (NORCO) 7.5-325 MG tablet, Take 1 tablet by mouth every 6 (six) hours as needed for moderate pain., Disp: 30 tablet, Rfl: 0   Multiple Vitamins-Minerals (MULTIVITAMIN PO), Take 1 tablet by mouth daily., Disp: , Rfl:    tamsulosin (FLOMAX) 0.4 MG CAPS capsule, TAKE 1 CAPSULE BY MOUTH IN  THE EVENING, Disp: 90 capsule, Rfl: 1   Turmeric 1053 MG TABS, Take 1 tablet by mouth daily., Disp: , Rfl:    Allergies  Allergen Reactions   Amlodipine Swelling    Swelling of lips.   Lisinopril Other (See Comments)    Elevated BP higher & causes a burning feeling on the inside.    Past Medical History:  Diagnosis Date   Aortic regurgitation    Moderate   Arthritis    BPH (benign prostatic hyperplasia)    Cervical disc disease    Cervical radiculopathy    Hyperlipidemia    Hypertension    Prediabetes 02/16/2021   Staphylococcus aureus bacteremia 08/09/2012   TEE negative for vegetation or thrombus February 2014   Stroke [facility]) 2005 or 2006   3    Symptomatic carotid artery stenosis with infarction [facility]) 2005 or 2006   Status post right carotid endarterectomy    Past Surgical History:  Procedure Laterality Date   BACK SURGERY     CAROTID ENDARTERECTOMY     CATARACT EXTRACTION W/PHACO Left 02/15/2015   Procedure: CATARACT EXTRACTION PHACO AND INTRAOCULAR LENS PLACEMENT LEFT EYE CDE=30.93;  Surgeon: Tonny Branch, MD;  Location: [facility];  Service: Ophthalmology;  Laterality: Left;   CHOLECYSTECTOMY     EYE SURGERY     KIDNEY STONE SURGERY     LUMBAR LAMINECTOMY/DECOMPRESSION MICRODISCECTOMY Bilateral 01/23/2014   Procedure: LUMBAR LAMINECTOMY/DECOMPRESSION MICRODISCECTOMY 1 LEVEL L5-S1;  Surgeon: Charlie Pitter, MD;  Location: [place] [facility];  Service: Neurosurgery;  Laterality: Bilateral;  LUMBAR LAMINECTOMY/DECOMPRESSION MICRODISCECTOMY 1 LEVEL L5-S1   TEE WITHOUT CARDIOVERSION N/A 08/12/2012   Procedure: TRANSESOPHAGEAL ECHOCARDIOGRAM (TEE);  Surgeon: Josue Hector, MD;  Location: [facility];  Service: Cardiovascular;  Laterality: N/A;   TEE WITHOUT CARDIOVERSION N/A 06/24/2013   Procedure: TRANSESOPHAGEAL  ECHOCARDIOGRAM (TEE);  Surgeon: Jonelle Sidle, MD;  Location: [facility];  Service: Endoscopy;  Laterality: N/A;    Social History   Socioeconomic History   Marital status: Married    Spouse name: Not on file   Number of children: Not on file   Years of education: Not on file   Highest education level: Not on file  Occupational History   Not on file  Tobacco Use   Smoking status: Never   Smokeless tobacco: Never  Vaping Use   Vaping Use: Never used  Substance and Sexual Activity   Alcohol use: No   Drug use: No   Sexual activity: Not Currently  Other Topics Concern   Not on file  Social History Narrative   Not on file   Social Determinants of Health   Financial Resource Strain: Not on file  Food Insecurity: Not on file  Transportation Needs: Not on file  Physical Activity: Not on file  Stress: Not on file   Social Connections: Not on file  Intimate Partner Violence: Not on file        Objective:    BP 108/64   Pulse 91   Temp 97.9 F (36.6 C) (Temporal)   Ht 5\' 5"  (1.651 m)   Wt 159 lb 12.8 oz (72.5 kg)   SpO2 96%   BMI 26.59 kg/m   Wt Readings from Last 3 Encounters:  01/14/22 159 lb 12.8 oz (72.5 kg)  12/13/21 165 lb (74.8 kg)  11/19/21 165 lb (74.8 kg)    Physical Exam Vitals reviewed.  Constitutional:      General: He is not in acute distress.    Appearance: Normal appearance. He is not ill-appearing, toxic-appearing or diaphoretic.  HENT:     Head: Normocephalic and atraumatic.  Eyes:     General: No scleral icterus.       Right eye: No discharge.        Left eye: No discharge.     Conjunctiva/sclera: Conjunctivae normal.  Cardiovascular:     Rate and Rhythm: Normal rate.  Pulmonary:     Effort: Pulmonary effort is normal. No respiratory distress.  Musculoskeletal:        General: Normal range of motion.     Cervical back: Normal range of motion.     Lumbar back: Tenderness and bony tenderness present.  Skin:    General: Skin is warm and dry.  Neurological:     Mental Status: He is alert and oriented to person, place, and time. Mental status is at baseline.     Gait: Gait abnormal (riding in [facility]).  Psychiatric:        Mood and Affect: Mood normal.        Behavior: Behavior normal.        Thought Content: Thought content normal.        Judgment: Judgment normal.     Lab Results  Component Value Date   TSH 2.508 06/21/2013   Lab Results  Component Value Date   WBC 10.2 12/13/2021   HGB 12.7 (L) 12/13/2021   HCT 38.5 (L) 12/13/2021   MCV 98.0 12/13/2021   PLT 221 12/13/2021   Lab Results  Component Value Date   NA 139 12/13/2021   K 4.3 12/13/2021   CO2 26 12/13/2021   GLUCOSE 113 (H) 12/13/2021   BUN 12 12/13/2021   CREATININE 0.88 12/13/2021   BILITOT 0.4 08/20/2021   ALKPHOS 72 08/20/2021   AST 23 08/20/2021  ALT 12 08/20/2021   PROT  6.2 08/20/2021   ALBUMIN 4.3 08/20/2021   CALCIUM 9.1 12/13/2021   ANIONGAP 9 12/13/2021   EGFR 69 08/20/2021   Lab Results  Component Value Date   CHOL 100 07/01/2021   Lab Results  Component Value Date   HDL 48 07/01/2021   Lab Results  Component Value Date   LDLCALC 37 07/01/2021   Lab Results  Component Value Date   TRIG 74 07/01/2021   Lab Results  Component Value Date   CHOLHDL 2.1 07/01/2021   Lab Results  Component Value Date   HGBA1C 5.2 08/20/2021

## 2022-01-31 DIAGNOSIS — H401134 Primary open-angle glaucoma, bilateral, indeterminate stage: Secondary | ICD-10-CM | POA: Diagnosis not present

## 2022-02-03 DIAGNOSIS — M5416 Radiculopathy, lumbar region: Secondary | ICD-10-CM | POA: Diagnosis not present

## 2022-02-07 ENCOUNTER — Other Ambulatory Visit: Payer: Self-pay

## 2022-02-07 ENCOUNTER — Encounter (HOSPITAL_COMMUNITY): Payer: Self-pay | Admitting: *Deleted

## 2022-02-07 ENCOUNTER — Inpatient Hospital Stay (HOSPITAL_COMMUNITY)
Admission: EM | Admit: 2022-02-07 | Discharge: 2022-02-17 | DRG: 064 | Disposition: A | Discharge status: Rehabilitation Facility | Payer: Medicare Other | Attending: Family Medicine | Admitting: Family Medicine

## 2022-02-07 ENCOUNTER — Emergency Department (HOSPITAL_COMMUNITY): Payer: Medicare Other

## 2022-02-07 ENCOUNTER — Inpatient Hospital Stay (HOSPITAL_COMMUNITY): Payer: Medicare Other

## 2022-02-07 DIAGNOSIS — Z961 Presence of intraocular lens: Secondary | ICD-10-CM | POA: Diagnosis present

## 2022-02-07 DIAGNOSIS — D72829 Elevated white blood cell count, unspecified: Secondary | ICD-10-CM | POA: Diagnosis not present

## 2022-02-07 DIAGNOSIS — Z789 Other specified health status: Secondary | ICD-10-CM

## 2022-02-07 DIAGNOSIS — R4701 Aphasia: Secondary | ICD-10-CM | POA: Diagnosis present

## 2022-02-07 DIAGNOSIS — Z515 Encounter for palliative care: Secondary | ICD-10-CM | POA: Diagnosis not present

## 2022-02-07 DIAGNOSIS — Z9049 Acquired absence of other specified parts of digestive tract: Secondary | ICD-10-CM | POA: Diagnosis not present

## 2022-02-07 DIAGNOSIS — R220 Localized swelling, mass and lump, head: Secondary | ICD-10-CM | POA: Diagnosis not present

## 2022-02-07 DIAGNOSIS — R471 Dysarthria and anarthria: Secondary | ICD-10-CM | POA: Diagnosis present

## 2022-02-07 DIAGNOSIS — N400 Enlarged prostate without lower urinary tract symptoms: Secondary | ICD-10-CM | POA: Diagnosis present

## 2022-02-07 DIAGNOSIS — I351 Nonrheumatic aortic (valve) insufficiency: Secondary | ICD-10-CM | POA: Diagnosis present

## 2022-02-07 DIAGNOSIS — I10 Essential (primary) hypertension: Secondary | ICD-10-CM | POA: Diagnosis present

## 2022-02-07 DIAGNOSIS — R531 Weakness: Secondary | ICD-10-CM | POA: Diagnosis not present

## 2022-02-07 DIAGNOSIS — R0602 Shortness of breath: Secondary | ICD-10-CM | POA: Diagnosis not present

## 2022-02-07 DIAGNOSIS — G936 Cerebral edema: Secondary | ICD-10-CM | POA: Diagnosis present

## 2022-02-07 DIAGNOSIS — I69391 Dysphagia following cerebral infarction: Secondary | ICD-10-CM | POA: Diagnosis not present

## 2022-02-07 DIAGNOSIS — G8929 Other chronic pain: Secondary | ICD-10-CM | POA: Diagnosis not present

## 2022-02-07 DIAGNOSIS — I6523 Occlusion and stenosis of bilateral carotid arteries: Secondary | ICD-10-CM | POA: Diagnosis not present

## 2022-02-07 DIAGNOSIS — R7989 Other specified abnormal findings of blood chemistry: Secondary | ICD-10-CM | POA: Diagnosis not present

## 2022-02-07 DIAGNOSIS — Z20822 Contact with and (suspected) exposure to covid-19: Secondary | ICD-10-CM | POA: Diagnosis not present

## 2022-02-07 DIAGNOSIS — L8990 Pressure ulcer of unspecified site, unspecified stage: Secondary | ICD-10-CM | POA: Insufficient documentation

## 2022-02-07 DIAGNOSIS — E870 Hyperosmolality and hypernatremia: Secondary | ICD-10-CM | POA: Diagnosis not present

## 2022-02-07 DIAGNOSIS — Z66 Do not resuscitate: Secondary | ICD-10-CM | POA: Diagnosis not present

## 2022-02-07 DIAGNOSIS — L89151 Pressure ulcer of sacral region, stage 1: Secondary | ICD-10-CM | POA: Diagnosis present

## 2022-02-07 DIAGNOSIS — R296 Repeated falls: Secondary | ICD-10-CM | POA: Diagnosis present

## 2022-02-07 DIAGNOSIS — I63512 Cerebral infarction due to unspecified occlusion or stenosis of left middle cerebral artery: Principal | ICD-10-CM | POA: Diagnosis present

## 2022-02-07 DIAGNOSIS — I63312 Cerebral infarction due to thrombosis of left middle cerebral artery: Secondary | ICD-10-CM | POA: Diagnosis not present

## 2022-02-07 DIAGNOSIS — Z7189 Other specified counseling: Secondary | ICD-10-CM | POA: Diagnosis not present

## 2022-02-07 DIAGNOSIS — Z7902 Long term (current) use of antithrombotics/antiplatelets: Secondary | ICD-10-CM

## 2022-02-07 DIAGNOSIS — I63412 Cerebral infarction due to embolism of left middle cerebral artery: Secondary | ICD-10-CM | POA: Diagnosis not present

## 2022-02-07 DIAGNOSIS — I63511 Cerebral infarction due to unspecified occlusion or stenosis of right middle cerebral artery: Secondary | ICD-10-CM | POA: Diagnosis not present

## 2022-02-07 DIAGNOSIS — G8191 Hemiplegia, unspecified affecting right dominant side: Secondary | ICD-10-CM | POA: Diagnosis not present

## 2022-02-07 DIAGNOSIS — I6930 Unspecified sequelae of cerebral infarction: Principal | ICD-10-CM

## 2022-02-07 DIAGNOSIS — I771 Stricture of artery: Secondary | ICD-10-CM | POA: Diagnosis not present

## 2022-02-07 DIAGNOSIS — I6389 Other cerebral infarction: Secondary | ICD-10-CM | POA: Diagnosis not present

## 2022-02-07 DIAGNOSIS — I639 Cerebral infarction, unspecified: Secondary | ICD-10-CM | POA: Diagnosis not present

## 2022-02-07 DIAGNOSIS — Z8249 Family history of ischemic heart disease and other diseases of the circulatory system: Secondary | ICD-10-CM

## 2022-02-07 DIAGNOSIS — Z743 Need for continuous supervision: Secondary | ICD-10-CM | POA: Diagnosis not present

## 2022-02-07 DIAGNOSIS — R1310 Dysphagia, unspecified: Secondary | ICD-10-CM | POA: Diagnosis not present

## 2022-02-07 DIAGNOSIS — I6611 Occlusion and stenosis of right anterior cerebral artery: Secondary | ICD-10-CM | POA: Diagnosis not present

## 2022-02-07 DIAGNOSIS — R29725 NIHSS score 25: Secondary | ICD-10-CM | POA: Diagnosis present

## 2022-02-07 DIAGNOSIS — R6889 Other general symptoms and signs: Secondary | ICD-10-CM | POA: Diagnosis not present

## 2022-02-07 DIAGNOSIS — H409 Unspecified glaucoma: Secondary | ICD-10-CM | POA: Diagnosis present

## 2022-02-07 DIAGNOSIS — E43 Unspecified severe protein-calorie malnutrition: Secondary | ICD-10-CM | POA: Diagnosis not present

## 2022-02-07 DIAGNOSIS — I739 Peripheral vascular disease, unspecified: Secondary | ICD-10-CM | POA: Diagnosis not present

## 2022-02-07 DIAGNOSIS — Z79899 Other long term (current) drug therapy: Secondary | ICD-10-CM

## 2022-02-07 DIAGNOSIS — I618 Other nontraumatic intracerebral hemorrhage: Secondary | ICD-10-CM | POA: Diagnosis present

## 2022-02-07 DIAGNOSIS — R404 Transient alteration of awareness: Secondary | ICD-10-CM | POA: Diagnosis not present

## 2022-02-07 DIAGNOSIS — I651 Occlusion and stenosis of basilar artery: Secondary | ICD-10-CM | POA: Diagnosis not present

## 2022-02-07 DIAGNOSIS — R29810 Facial weakness: Secondary | ICD-10-CM | POA: Diagnosis not present

## 2022-02-07 DIAGNOSIS — R414 Neurologic neglect syndrome: Secondary | ICD-10-CM | POA: Diagnosis not present

## 2022-02-07 DIAGNOSIS — E782 Mixed hyperlipidemia: Secondary | ICD-10-CM

## 2022-02-07 DIAGNOSIS — Z888 Allergy status to other drugs, medicaments and biological substances status: Secondary | ICD-10-CM

## 2022-02-07 DIAGNOSIS — Z8673 Personal history of transient ischemic attack (TIA), and cerebral infarction without residual deficits: Secondary | ICD-10-CM

## 2022-02-07 DIAGNOSIS — R0689 Other abnormalities of breathing: Secondary | ICD-10-CM | POA: Diagnosis not present

## 2022-02-07 DIAGNOSIS — E785 Hyperlipidemia, unspecified: Secondary | ICD-10-CM | POA: Diagnosis present

## 2022-02-07 DIAGNOSIS — D72823 Leukemoid reaction: Secondary | ICD-10-CM | POA: Diagnosis not present

## 2022-02-07 LAB — CBC
HCT: 38.7 % — ABNORMAL LOW (ref 39.0–52.0)
Hemoglobin: 12.9 g/dL — ABNORMAL LOW (ref 13.0–17.0)
MCH: 32.3 pg (ref 26.0–34.0)
MCHC: 33.3 g/dL (ref 30.0–36.0)
MCV: 97 fL (ref 80.0–100.0)
Platelets: 235 10*3/uL (ref 150–400)
RBC: 3.99 MIL/uL — ABNORMAL LOW (ref 4.22–5.81)
RDW: 12.2 % (ref 11.5–15.5)
WBC: 11.8 10*3/uL — ABNORMAL HIGH (ref 4.0–10.5)
nRBC: 0 % (ref 0.0–0.2)

## 2022-02-07 LAB — HEMOGLOBIN A1C
Hgb A1c MFr Bld: 5.4 % (ref 4.8–5.6)
Mean Plasma Glucose: 108.28 mg/dL

## 2022-02-07 LAB — COMPREHENSIVE METABOLIC PANEL
ALT: 16 U/L (ref 0–44)
AST: 23 U/L (ref 15–41)
Albumin: 3.9 g/dL (ref 3.5–5.0)
Alkaline Phosphatase: 53 U/L (ref 38–126)
Anion gap: 10 (ref 5–15)
BUN: 17 mg/dL (ref 8–23)
CO2: 25 mmol/L (ref 22–32)
Calcium: 8.9 mg/dL (ref 8.9–10.3)
Chloride: 103 mmol/L (ref 98–111)
Creatinine, Ser: 0.92 mg/dL (ref 0.61–1.24)
GFR, Estimated: >60 mL/min (ref >60)
Glucose, Bld: 148 mg/dL — ABNORMAL HIGH (ref 70–99)
Potassium: 3.6 mmol/L (ref 3.5–5.1)
Sodium: 138 mmol/L (ref 135–145)
Total Bilirubin: 1.3 mg/dL — ABNORMAL HIGH (ref 0.3–1.2)
Total Protein: 6.8 g/dL (ref 6.5–8.1)

## 2022-02-07 LAB — URINALYSIS, ROUTINE W REFLEX MICROSCOPIC
Bilirubin Urine: NEGATIVE
Glucose, UA: NEGATIVE mg/dL
Ketones, ur: 80 mg/dL — AB
Leukocytes,Ua: NEGATIVE
Nitrite: NEGATIVE
Protein, ur: NEGATIVE mg/dL
Specific Gravity, Urine: >1.046 — ABNORMAL HIGH (ref 1.005–1.030)
pH: 7 (ref 5.0–8.0)

## 2022-02-07 LAB — I-STAT CHEM 8, ED
BUN: 15 mg/dL (ref 8–23)
Calcium, Ion: 1.09 mmol/L — ABNORMAL LOW (ref 1.15–1.40)
Chloride: 100 mmol/L (ref 98–111)
Creatinine, Ser: 0.9 mg/dL (ref 0.61–1.24)
Glucose, Bld: 145 mg/dL — ABNORMAL HIGH (ref 70–99)
HCT: 40 % (ref 39.0–52.0)
Hemoglobin: 13.6 g/dL (ref 13.0–17.0)
Potassium: 3.7 mmol/L (ref 3.5–5.1)
Sodium: 138 mmol/L (ref 135–145)
TCO2: 22 mmol/L (ref 22–32)

## 2022-02-07 LAB — RESP PANEL BY RT-PCR (FLU A&B, COVID) ARPGX2
Influenza A by PCR: NEGATIVE
Influenza B by PCR: NEGATIVE
SARS Coronavirus 2 by RT PCR: NEGATIVE

## 2022-02-07 LAB — DIFFERENTIAL
Abs Immature Granulocytes: 0.11 10*3/uL — ABNORMAL HIGH (ref 0.00–0.07)
Basophils Absolute: 0 10*3/uL (ref 0.0–0.1)
Basophils Relative: 0 %
Eosinophils Absolute: 0.1 10*3/uL (ref 0.0–0.5)
Eosinophils Relative: 1 %
Immature Granulocytes: 1 %
Lymphocytes Relative: 14 %
Lymphs Abs: 1.6 10*3/uL (ref 0.7–4.0)
Monocytes Absolute: 0.9 10*3/uL (ref 0.1–1.0)
Monocytes Relative: 8 %
Neutro Abs: 9.1 10*3/uL — ABNORMAL HIGH (ref 1.7–7.7)
Neutrophils Relative %: 76 %

## 2022-02-07 LAB — ETHANOL: Alcohol, Ethyl (B): <10 mg/dL (ref <10)

## 2022-02-07 LAB — APTT: aPTT: 25 seconds (ref 24–36)

## 2022-02-07 LAB — PROTIME-INR
INR: 1.2 (ref 0.8–1.2)
Prothrombin Time: 14.6 seconds (ref 11.4–15.2)

## 2022-02-07 MED ORDER — STROKE: EARLY STAGES OF RECOVERY BOOK
Freq: Once | Status: AC
  Filled 2022-02-07: qty 1

## 2022-02-07 MED ORDER — ACETAMINOPHEN 325 MG PO TABS: 650.0000 mg | ORAL_TABLET | ORAL | Status: DC | PRN

## 2022-02-07 MED ORDER — SODIUM CHLORIDE 0.9 % IV SOLN: INTRAVENOUS | Status: AC

## 2022-02-07 MED ORDER — HEPARIN SODIUM (PORCINE) 5000 UNIT/ML IJ SOLN
5000.0000 [IU] | Freq: Three times a day (TID) | INTRAMUSCULAR | Status: DC
  Administered 2022-02-07 – 2022-02-17 (×29): 5000 [IU] via SUBCUTANEOUS
  Filled 2022-02-07 (×29): qty 1

## 2022-02-07 MED ORDER — BRIMONIDINE TARTRATE 0.2 % OP SOLN
1.0000 [drp] | Freq: Three times a day (TID) | OPHTHALMIC | Status: DC
  Administered 2022-02-07 – 2022-02-17 (×29): 1 [drp] via OPHTHALMIC
  Filled 2022-02-07 (×3): qty 5

## 2022-02-07 MED ORDER — ACETAMINOPHEN 160 MG/5ML PO SOLN: 650.0000 mg | ORAL | Status: DC | PRN

## 2022-02-07 MED ORDER — METOPROLOL TARTRATE 5 MG/5ML IV SOLN
5.0000 mg | Freq: Once | INTRAVENOUS | Status: DC
Start: 2022-02-08 — End: 2022-02-07

## 2022-02-07 MED ORDER — ACETAMINOPHEN 650 MG RE SUPP
650.0000 mg | RECTAL | Status: DC | PRN
  Administered 2022-02-08 – 2022-02-13 (×3): 650 mg via RECTAL
  Filled 2022-02-07 (×3): qty 1

## 2022-02-07 MED ORDER — SENNOSIDES-DOCUSATE SODIUM 8.6-50 MG PO TABS: 1.0000 | ORAL_TABLET | Freq: Every evening | ORAL | Status: DC | PRN

## 2022-02-07 MED ORDER — LATANOPROST 0.005 % OP SOLN
1.0000 [drp] | Freq: Every evening | OPHTHALMIC | Status: DC
  Administered 2022-02-07 – 2022-02-16 (×10): 1 [drp] via OPHTHALMIC
  Filled 2022-02-07 (×2): qty 2.5

## 2022-02-07 MED ORDER — IOHEXOL 350 MG/ML SOLN
100.0000 mL | Freq: Once | INTRAVENOUS | Status: AC | PRN
  Administered 2022-02-07: 100 mL via INTRAVENOUS

## 2022-02-07 MED ORDER — ASPIRIN 300 MG RE SUPP
300.0000 mg | Freq: Every day | RECTAL | Status: DC
Start: 2022-02-07 — End: 2022-02-08
  Administered 2022-02-07 – 2022-02-08 (×2): 300 mg via RECTAL
  Filled 2022-02-07 (×4): qty 1

## 2022-02-07 NOTE — Assessment & Plan Note (Signed)
-  Mostly borderline and no using antihypertensive agents as an outpatient -Allowing permissive hypertension in the setting of acute ischemic stroke. -Follow-up vital signs and determine the need for antihypertensive agents.

## 2022-02-07 NOTE — ED Notes (Addendum)
Pt arrived back to ED room from CT. Dr. Rhunette Croft notified RN that pt is not a TNK candidate. Possibly IR candidate, but awaiting results from CT Angio.

## 2022-02-07 NOTE — ED Provider Notes (Signed)
[facility] EMERGENCY DEPARTMENT Provider Note   CSN: [id number] Arrival date & time: 02/07/22  6967  An emergency department physician performed an initial assessment on this suspected stroke patient at 0905.  History  Chief Complaint  Patient presents with   Code Stroke    Paul Bradshaw is a 88 y.o. male.  HPI     88 year old male comes in with chief complaint of code stroke. Per EMS, patient was noted to be waking up at 6 AM, having difficulty getting out of the bed.  Patient was unable to use his right hand or stand up.  Family was able to take patient to the living room, but he was not talking and had an abnormal gaze, unable to move therefore they called 911.  Patient has history of carotid artery disease, hypertension, hyperlipidemia and prior stroke.  Patient unable to communicate, positive gag reflex.   Home Medications Prior to Admission medications   Medication Sig Start Date End Date Taking? Authorizing Provider  albuterol (VENTOLIN HFA) 108 (90 Base) MCG/ACT inhaler Inhale 2 puffs into the lungs every 6 (six) hours as needed for shortness of breath or wheezing. 08/20/21  Yes Deliah Boston F, FNP  atorvastatin (LIPITOR) 10 MG tablet TAKE ONE-HALF TABLET BY  MOUTH DAILY AT 6 PM 09/12/21  Yes Gwenlyn Fudge, FNP  brimonidine (ALPHAGAN) 0.2 % ophthalmic solution Place 1 drop into both eyes 3 (three) times daily. 10/08/21  Yes [provider]  cetirizine (ZYRTEC) 10 MG tablet Take 10 mg by mouth daily.   Yes [provider]  clopidogrel (PLAVIX) 75 MG tablet TAKE 1 TABLET BY MOUTH  DAILY WITH BREAKFAST 09/12/21  Yes Deliah Boston F, FNP  dorzolamide-timolol (COSOPT) 22.3-6.8 MG/ML ophthalmic solution Place 1 drop into both eyes 2 (two) times daily. 10/08/21  Yes [provider]  finasteride (PROSCAR) 5 MG tablet Take 1 tablet (5 mg total) by mouth daily. 08/19/21  Yes Jerilee Field, MD  HYDROcodone-acetaminophen (NORCO) 7.5-325 MG tablet  Take 1 tablet by mouth every 6 (six) hours as needed for moderate pain. 01/14/22  Yes Gwenlyn Fudge, FNP  latanoprost (XALATAN) 0.005 % ophthalmic solution Place 1 drop into both eyes every evening. 01/31/22  Yes [provider]  Multiple Vitamins-Minerals (MULTIVITAMIN PO) Take 1 tablet by mouth daily.   Yes [provider]  tamsulosin (FLOMAX) 0.4 MG CAPS capsule TAKE 1 CAPSULE BY MOUTH IN  THE EVENING 09/12/21  Yes Deliah Boston F, FNP  Turmeric 1053 MG TABS Take 1 tablet by mouth daily.   Yes [provider]  acetaminophen (TYLENOL) 500 MG tablet Take 1,000 mg by mouth every 8 (eight) hours as needed for moderate pain.    [provider]  HYDROcodone-acetaminophen (NORCO) 7.5-325 MG tablet Take 1 tablet by mouth every 6 (six) hours as needed for moderate pain. Patient not taking: Reported on 02/07/2022 02/13/22   Gwenlyn Fudge, FNP  HYDROcodone-acetaminophen (NORCO) 7.5-325 MG tablet Take 1 tablet by mouth every 6 (six) hours as needed for moderate pain. Patient not taking: Reported on 02/07/2022 03/15/22   Gwenlyn Fudge, FNP      Allergies    Amlodipine and Lisinopril    Review of Systems   Review of Systems  Unable to perform ROS: Patient unresponsive    Physical Exam Updated Vital Signs BP (!) 158/68   Pulse 85   Temp 98.1 F (36.7 C) (Oral)   Resp 19   SpO2 99%  Physical Exam Vitals and  nursing note reviewed.  Constitutional:      Appearance: He is well-developed.  Eyes:     Comments: Left-sided gaze  Cardiovascular:     Rate and Rhythm: Normal rate.  Pulmonary:     Effort: Pulmonary effort is normal.  Abdominal:     Tenderness: There is no abdominal tenderness.  Musculoskeletal:     Cervical back: Neck supple.  Skin:    General: Skin is warm.  Neurological:     Mental Status: He is alert.     Comments: Unable to speak, patient has left-sided gaze abnormality. Right upper extremity strength 0 out of 5 Left upper extremity  strength is 4 out of 5 Bilateral lower extremity strength is 4 out of 5 Patient withdraws to noxious stimuli in all extremities      ED Results / Procedures / Treatments   Labs (all labs ordered are listed, but only abnormal results are displayed) Labs Reviewed  CBC - Abnormal; Notable for the following components:      Result Value   WBC 11.8 (*)    RBC 3.99 (*)    Hemoglobin 12.9 (*)    HCT 38.7 (*)    All other components within normal limits  DIFFERENTIAL - Abnormal; Notable for the following components:   Neutro Abs 9.1 (*)    Abs Immature Granulocytes 0.11 (*)    All other components within normal limits  COMPREHENSIVE METABOLIC PANEL - Abnormal; Notable for the following components:   Glucose, Bld 148 (*)    Total Bilirubin 1.3 (*)    All other components within normal limits  I-STAT CHEM 8, ED - Abnormal; Notable for the following components:   Glucose, Bld 145 (*)    Calcium, Ion 1.09 (*)    All other components within normal limits  RESP PANEL BY RT-PCR (FLU A&B, COVID) ARPGX2  ETHANOL  PROTIME-INR  APTT  RAPID URINE DRUG SCREEN, HOSP PERFORMED  URINALYSIS, ROUTINE W REFLEX MICROSCOPIC    EKG EKG Interpretation  Date/Time:  Friday February 07 2022 09:52:58 EDT Ventricular Rate:  85 PR Interval:  211 QRS Duration: 89 QT Interval:  380 QTC Calculation: 452 R Axis:   -77 Text Interpretation: Sinus rhythm Left anterior fascicular block No acute changes No significant change since last tracing Confirmed by Varney Biles [phone]) on 02/07/2022 12:19:50 PM  Radiology CT ANGIO HEAD NECK W WO CM W PERF (CODE STROKE)  Result Date: 02/07/2022 CLINICAL DATA:  88 year old male code stroke presentation. Subtle left MCA infarct changes on plain head CT. EXAM: CT ANGIOGRAPHY HEAD AND NECK CT PERFUSION BRAIN TECHNIQUE: Multidetector CT imaging of the head and neck was performed using the standard protocol during bolus administration of intravenous contrast. Multiplanar CT  image reconstructions and MIPs were obtained to evaluate the vascular anatomy. Carotid stenosis measurements (when applicable) are obtained utilizing NASCET criteria, using the distal internal carotid diameter as the denominator. Multiphase CT imaging of the brain was performed following IV bolus contrast injection. Subsequent parametric perfusion maps were calculated using RAPID software. RADIATION DOSE REDUCTION: This exam was performed according to the departmental dose-optimization program which includes automated exposure control, adjustment of the mA and/or kV according to patient size and/or use of iterative reconstruction technique. CONTRAST:  170mL OMNIPAQUE IOHEXOL 350 MG/ML SOLN COMPARISON:  Plain head CT 0917 hours today. FINDINGS: CT Brain Perfusion Findings: ASPECTS: 9 CBF (<30%) Volume: 14mL.  CBV changes in the same territory. Perfusion (Tmax>6.0s) volume: 68mL. Hypoperfusion index 0.5 (41 mL of T-max >  10 s. Mismatch Volume: 58mL Infarction Location:Left MCA CTA NECK Skeleton: Absent maxillary dentition. Cervical spine degeneration although mild for age. No acute osseous abnormality identified. Upper chest: Negative. Other neck: Negative. Aortic arch: Calcified aortic atherosclerosis. Three vessel arch configuration. Right carotid system: Tortuous brachiocephalic artery with minimal plaque and no stenosis. Mildly tortuous right CCA. Intermittent plaque before the bifurcation without stenosis. Moderate right lateral and posterior ICA origin and bulb calcified plaque but no stenosis. Left carotid system: Mildly tortuous proximal left CCA with mild soft plaque. Calcified plaque before the bifurcation but no stenosis. Calcified plaque at the bifurcation along with bulky complex soft and calcified plaque throughout the left ICA bulb and subsequent string sign stenosis of the vessel (series 5, image 111 and series 9, image 140. Despite this the left ICA remains patent although is diminutive in the upper  neck. Vertebral arteries: Tortuous proximal right subclavian artery without stenosis. Calcified plaque at the right vertebral artery origin with moderate to severe stenosis (series 8, image 161). But the vessel remains patent to the skull base with no additional plaque or stenosis. Proximal left subclavian artery soft and calcified plaque with up to 50 % stenosis with respect to the distal vessel. Normal left vertebral artery origin. Mildly dominant appearing left vertebral artery is patent to the skull base with no plaque or stenosis. CTA HEAD Posterior circulation: Right V4 segment calcified plaque at the crossing of the dura with no significant stenosis. The right vertebral artery terminates in PICA. Left V4 segment is normal and supplies the basilar. Patent basilar artery with multifocal irregularity and up to mild stenosis (series 12, image 23). Basilar artery tip remains patent. Patent SCA and left PCA origins. Fetal type bilateral PCA origins. Bilateral posterior communicating artery irregularity with mild stenosis. Bilateral PCA branches remain patent but there is severe short segment left and moderate to severe right P2 segment stenoses (series 12, images 19 and 25). Anterior circulation: Both ICA siphons are patent and heavily calcified. On the right side there is mild to moderate cavernous but moderate to severe supraclinoid stenosis (series 9, image 100) proximal to the right posterior communicating artery origin which remains normal. The left siphon is remarkable for conspicuous low-density filling defect in the vertical petrous segment (series 7, image 124 and series 9, image 119 suspicious for thrombus with severe stenosis of the vessel there. The left siphon remains patent with up to moderate additional calcified atherosclerotic stenosis. Left posterior communicating artery is patent. Patent carotid termini. Dominant left ACA and diminutive right A1 segments. Normal anterior communicating artery.  Left ACA branches remain within normal limits, but there is severe right A2 segment stenosis (series 12, image 23). Right MCA M1 segment and ectatic MCA trifurcation are patent without stenosis. Right MCA branches are within normal limits. Left M1 segment remains patent although with suspicion of nonocclusive thrombus along the floor of the vessel series 19, image 128. Left MCA trifurcation is patent with mild irregularity. The most proximal MCA branch occlusion is visible on series 12, image 30 and appears to be an M2 branch at its bifurcation. There are multiple left M3 and M4 branch occlusions in the hemisphere. Venous sinuses: Early contrast timing, not well evaluated. Anatomic variants: Dominant left vertebral artery supplies the basilar, the right terminates in PICA. Dominant left and diminutive or absent right ACA A1 segments. Review of the MIP images confirms the above findings Preliminary result discussed by telephone with Dr. Su Monks on 02/07/2022 at 09:38 . IMPRESSION: 1.  Positive for acute Left MCA ischemia with estimated infarct core of 48 mL (fairly concordant with ASPECTS 9) and 41 mL additional territory oligemia with a hypoperfusion index of 0.5. 2. CTA reveals near occlusion of the Left ICA origin and bulb, age indeterminate, but acute appearing thrombus in the Left siphon (series 9, image 119) with severe stenosis, and multiple MCA branch occlusions although predominantly M3/M4 distal branches. An M2 bifurcation appears occluded on series 12, image 30. And there is subtle nonocclusive thrombus suspected in the M1 segment. 3. Salient points above discussed by telephone with Dr. Bing Neighbors on 02/07/2022 at 0938 hours. 4. Underlying intracranial atherosclerosis, including severe bilateral P2 and right A2 segment stenoses. Severe right ICA supraclinoid stenosis. 5. And there is also severe atherosclerotic stenosis of the right vertebral artery origin, the right vertebral terminates in PICA. 6.   Aortic Atherosclerosis (ICD10-I70.0). Electronically Signed   By: Odessa Fleming M.D.   On: 02/07/2022 09:53   CT HEAD CODE STROKE WO CONTRAST  Result Date: 02/07/2022 CLINICAL DATA:  Code stroke. 88 year old male with leftward gaze, aphasia, right side weakness. EXAM: CT HEAD WITHOUT CONTRAST TECHNIQUE: Contiguous axial images were obtained from the base of the skull through the vertex without intravenous contrast. RADIATION DOSE REDUCTION: This exam was performed according to the departmental dose-optimization program which includes automated exposure control, adjustment of the mA and/or kV according to patient size and/or use of iterative reconstruction technique. COMPARISON:  Brain MRI 06/21/2013.  Head CT 05/03/2014. FINDINGS: Brain: Early loss of normal gray-white matter differentiation in the left MCA territory visible at the M 5 segment on series 3 images 21 and 24. Heterogeneity in the left basal ganglia and anterior external capsule is chronic and stable. No acute intracranial hemorrhage identified. No midline shift, mass effect, or evidence of intracranial mass lesion. No ventriculomegaly. Gray-white matter differentiation outside of the M 5 segment appears stable with patchy chronic white matter hypodensity in both hemispheres. Vascular: Extensive Calcified atherosclerosis at the skull base. New density associated with an left MCA branch in the sylvian fissure on series 3, image 17 but might be calcified atherosclerosis. No other No suspicious intracranial vascular hyperdensity. Skull: No acute osseous abnormality identified. Sinuses/Orbits: Visualized paranasal sinuses and mastoids are stable and well aerated. Other: No gaze deviation on these images. No acute orbit or scalp soft tissue finding. ASPECTS [facility] Stroke Program Early CT Score) - Ganglionic level infarction (caudate, lentiform nuclei, internal capsule, insula, M1-M3 cortex): 7 - Supraganglionic infarction (M4-M6 cortex): 2 Total score (0-10  with 10 being normal): 9 IMPRESSION: 1. Subtle cytotoxic edema suspected in the Left MCA territory M5 segment. ASPECTS 9. 2. No intracranial hemorrhage or mass effect. Underlying chronic small vessel disease. Equivocal hyperdensity of a left MCA branch in the sylvian fissure. 3. Study discussed by telephone with Dr. Rhunette Croft on 02/07/2022 at 09:26 . Electronically Signed   By: Odessa Fleming M.D.   On: 02/07/2022 09:27    Procedures .Critical Care  Performed by: Derwood Kaplan, MD Authorized by: Derwood Kaplan, MD   Critical care provider statement:    Critical care time (minutes):  47   Critical care was necessary to treat or prevent imminent or life-threatening deterioration of the following conditions:  CNS failure or compromise   Critical care was time spent personally by me on the following activities:  Development of treatment plan with patient or surrogate, discussions with consultants, evaluation of patient's response to treatment, examination of patient, ordering and review of laboratory studies,  ordering and review of radiographic studies, ordering and performing treatments and interventions, pulse oximetry, re-evaluation of patient's condition, review of old charts and obtaining history from patient or surrogate     Medications Ordered in ED Medications  aspirin suppository 300 mg (300 mg Rectal Given 02/07/22 1108)  iohexol (OMNIPAQUE) 350 MG/ML injection 100 mL (100 mLs Intravenous Contrast Given 02/07/22 [id number])    ED Course/ Medical Decision Making/ A&P                           Medical Decision Making Amount and/or Complexity of Data Reviewed Labs: ordered.  Risk Decision regarding hospitalization.   88 year old male comes in with chief complaint of altered mental status, left-sided gaze.  History provided by EMS and we called patient's son, who provided additional history.  Last known normal is actually 10 PM.  It appears that patient woke up this morning and has not been  able to use his right side or walk.  He is also not spoken.  Patient is not speaking now, but is protecting airway as he has a weak gag reflex.  Exam reveals left-sided gaze with right upper extremity weakness/paralysis.  Patient is also voided on himself.  Differential diagnosis includes acute ischemic stroke, hemorrhagic stroke, Todd's paralysis postseizure.  Code stroke was activated and continued.  CT scan was interpreted and visualized independently.  There is no evidence of brain bleed.  Neurology team was consulted immediately.  Dr. Quinn Axe, neurology assessed the patient with me.  We also reviewed the CT scan together.  There is concerns for left MCA stroke.  CT angiogram was completed, Dr. Quinn Axe discussed the case thereafter with neuro IR and decision was made that benefit does not outweigh the risk for this patient.  Thereafter I discussed the findings with patient's son in detail.  CODE STATUS is DNR, DNI.  Family is hoping that patient is able to recover a bit from the stroke, as they still think he is able to comprehend, just not able to communicate.  I discussed with him that patient probably might be having some difficulty swallowing as well and they informed me that patient would likely not want to live with feeding tube or on a ventilator.  I discussed with them that there is a possibility of aspiration as well if he is not able to control his secretions.   Neurology team recommends that patient be admitted to stepdown ICU here.  Family amenable to that plan.  Medicine consulted.  Labs reviewed and interpreted independent.  They overall look reassuring.  CBC, metabolic profile are normal.   Final Clinical Impression(s) / ED Diagnoses Final diagnoses:  Chronic ischemic left MCA stroke    Rx / DC Orders ED Discharge Orders     None         Varney Biles, MD 02/07/22 1228

## 2022-02-07 NOTE — ED Triage Notes (Signed)
Pt brought to ED by [facility] from home. LKW 0600. Pt was alert and getting dressed, but not talking. When family went back into the room, they noticed him not responding. Hx of stroke. Pt presents to ED with left sided gaze, right sided neglect, loss of urination, nonverbal. EMS reports pt initially clammy and SBP of 80. Last BP 138/78 for EMS. 20g IV to Lt AC. Code Stroke called in the field by EMS.

## 2022-02-07 NOTE — Progress Notes (Signed)
Upon re-assessment patient has had a change in symptoms. NIH score now 30. Pt responsive to painful stimuli, drift now present in left arm,left leg, and Rt leg no effort against gravity. Pupils equal and sluggish. Slight upward gaze noted and no longer tracks horizontal movement like as he did earlier but did turn to face Rn after being asked repeatedly. Pt does not follow commands.Dr. Crawford Givens- Delrae Rend informed. STAT CT ordered. Wardell Heath Gerrianne Scale

## 2022-02-07 NOTE — Consult Note (Addendum)
NEUROLOGY TELECONSULTATION NOTE   Date of service: February 07, 2022 Patient Name: Paul Bradshaw MRN:  [id number] DOB:  January 22, 1926 Reason for consult: telestroke R sided weakness, aphasia, L gaze deviation  Requesting Provider: Dr. Derwood Kaplan Consult Participants: myself, patient, Dr. Rhunette Croft, bedside RN, telestroke RN Location of the provider: [facility] Location of the patient: [facility]  This consult was provided via telemedicine with 2-way video and audio communication. The patient/family was informed that care would be provided in this way and agreed to receive care in this manner.   _ _ _   _ __   _ __ _ _  __ __   _ __   __ _  History of Present Illness   This is a 88 year old gentleman with past medical history of right carotid stenosis status post CEA, left ICA stenosis last visualized on carotid Dopplers 2000 1460 to 79% stenosis, cervical radiculopathy, hyperlipidemia, hypertension, prediabetes, multiple prior ischemic infarcts in 2005 and 2006 who is BIB EMS with L gaze deviation, R hemiparesis and neglect, and global aphasia. LKW 2200 last night. LKW initially reported to be 0600 this AM but this was incorrect according to son Isair Inabinet that I spoke with over the phone. Patient was outside the window for TNK. NIHSS = 25 on arrival. CT head showed subtle cytotoxic edema in L MCA territory with ASPECTS 9, equivocal hyperdensity of L MCA branch in sylvian fissure.   CTA/CTP  1. Positive for acute Left MCA ischemia with estimated infarct core of 48 mL (fairly concordant with ASPECTS 9) and 41 mL additional territory oligemia with a hypoperfusion index of 0.5.   2. CTA reveals near occlusion of the Left ICA origin and bulb, age indeterminate, but acute appearing thrombus in the Left siphon (series 9, image 119) with severe stenosis, and multiple MCA branch occlusions although predominantly M3/M4 distal branches. An M2 bifurcation appears occluded on series 12, image 30. And there  is subtle nonocclusive thrombus suspected in the M1 segment.  CNS imaging personally reviewed and discussed by phone with neuroradiologist Dr. Margo Aye  I discussed these findings with neuro-IR Dr. Tommie Sams by phone. Due to his advanced age (88 yo), mRS of 3-4, relatively large core relative to penumbra, multifocal lesions including numerous M3/M4 branch occlusions, and known pre-existing carotid stenosis (due to remote imaging in epic and also no involvement of L ACA territory on CTP) patient was felt to be a poor candidate for intervention.  I discussed all of this with son Jaasiel Hollyfield by phone [phone]). Patient is married but his wife cannot care for herself 2/2 dementia and son is de Transport planner for his father. We discussed options for next steps including aggressive care with intubation and ICU mgmt vs comfort care. Son states that his father would not want to be kept on life support if he would be a vegetable; I told son that at this point I cannot tell him for sure what his father's outcome will be but that I could confirm 1) we cannot fix the stroke, 2) he would not return to his prior baseline, and 3) he is expected to have significant deficits going forward including speech / ability to communicate. Son is considering comfort care but has NOT made final decision on this yet, he is on his way to the hospital and should arrive within 30 min.    ROS   UTA 2/2 aphasia  Past History   The following was personally reviewed:  Past Medical  History:  Diagnosis Date   Aortic regurgitation    Moderate   Arthritis    BPH (benign prostatic hyperplasia)    Cervical disc disease    Cervical radiculopathy    Hyperlipidemia    Hypertension    Prediabetes 02/16/2021   Staphylococcus aureus bacteremia 08/09/2012   TEE negative for vegetation or thrombus February 2014   Stroke [facility]) 2005 or 2006   3   Symptomatic carotid artery stenosis with infarction [facility]) 2005 or 2006    Status post right carotid endarterectomy   Past Surgical History:  Procedure Laterality Date   BACK SURGERY     CAROTID ENDARTERECTOMY     CATARACT EXTRACTION W/PHACO Left 02/15/2015   Procedure: CATARACT EXTRACTION PHACO AND INTRAOCULAR LENS PLACEMENT LEFT EYE CDE=30.93;  Surgeon: Gemma Payor, MD;  Location: [facility] ORS;  Service: Ophthalmology;  Laterality: Left;   CHOLECYSTECTOMY     EYE SURGERY     KIDNEY STONE SURGERY     LUMBAR LAMINECTOMY/DECOMPRESSION MICRODISCECTOMY Bilateral 01/23/2014   Procedure: LUMBAR LAMINECTOMY/DECOMPRESSION MICRODISCECTOMY 1 LEVEL L5-S1;  Surgeon: Temple Pacini, MD;  Location: [facility] NEUR[facility] ORS;  Service: Neurosurgery;  Laterality: Bilateral;  LUMBAR LAMINECTOMY/DECOMPRESSION MICRODISCECTOMY 1 LEVEL L5-S1   TEE WITHOUT CARDIOVERSION N/A 08/12/2012   Procedure: TRANSESOPHAGEAL ECHOCARDIOGRAM (TEE);  Surgeon: Wendall Stade, MD;  Location: [facility] ENDO SUITE;  Service: Cardiovascular;  Laterality: N/A;   TEE WITHOUT CARDIOVERSION N/A 06/24/2013   Procedure: TRANSESOPHAGEAL ECHOCARDIOGRAM (TEE);  Surgeon: Jonelle Sidle, MD;  Location: [facility] END[facility] SUITE;  Service: Endoscopy;  Laterality: N/A;   Family History  Problem Relation Age of Onset   Emphysema Father    Alzheimer's disease Mother    Heart disease Brother    Heart attack Brother    COPD Son    Social History   Socioeconomic History   Marital status: Married    Spouse name: Not on file   Number of children: Not on file   Years of education: Not on file   Highest education level: Not on file  Occupational History   Not on file  Tobacco Use   Smoking status: Never   Smokeless tobacco: Never  Vaping Use   Vaping Use: Never used  Substance and Sexual Activity   Alcohol use: No   Drug use: No   Sexual activity: Not Currently  Other Topics Concern   Not on file  Social History Narrative   Not on file   Social Determinants of Health   Financial Resource Strain: Not on file  Food Insecurity: Not on  file  Transportation Needs: Not on file  Physical Activity: Not on file  Stress: Not on file  Social Connections: Not on file   Allergies  Allergen Reactions   Amlodipine Swelling    Swelling of lips.   Lisinopril Other (See Comments)    Elevated BP higher & causes a burning feeling on the inside.     Medications   (Not in a hospital admission)    No current facility-administered medications for this encounter.  Current Outpatient Medications:    acetaminophen (TYLENOL) 500 MG tablet, Take 1,000 mg by mouth every 8 (eight) hours as needed for moderate pain., Disp: , Rfl:    albuterol (VENTOLIN HFA) 108 (90 Base) MCG/ACT inhaler, Inhale 2 puffs into the lungs every 6 (six) hours as needed for shortness of breath or wheezing., Disp: 18 g, Rfl: 0   atorvastatin (LIPITOR) 10 MG tablet, TAKE ONE-HALF TABLET BY  MOUTH DAILY AT 6 PM,  Disp: 45 tablet, Rfl: 1   brimonidine (ALPHAGAN) 0.2 % ophthalmic solution, SMARTSIG:In Eye(s), Disp: , Rfl:    cetirizine (ZYRTEC) 10 MG tablet, Take 10 mg by mouth daily., Disp: , Rfl:    clopidogrel (PLAVIX) 75 MG tablet, TAKE 1 TABLET BY MOUTH  DAILY WITH BREAKFAST, Disp: 90 tablet, Rfl: 1   dorzolamide-timolol (COSOPT) 22.3-6.8 MG/ML ophthalmic solution, SMARTSIG:In Eye(s), Disp: , Rfl:    finasteride (PROSCAR) 5 MG tablet, Take 1 tablet (5 mg total) by mouth daily., Disp: 90 tablet, Rfl: 3   HYDROcodone-acetaminophen (NORCO) 7.5-325 MG tablet, Take 1 tablet by mouth every 6 (six) hours as needed for moderate pain., Disp: 90 tablet, Rfl: 0   [START ON 02/13/2022] HYDROcodone-acetaminophen (NORCO) 7.5-325 MG tablet, Take 1 tablet by mouth every 6 (six) hours as needed for moderate pain., Disp: 90 tablet, Rfl: 0   [START ON 03/15/2022] HYDROcodone-acetaminophen (NORCO) 7.5-325 MG tablet, Take 1 tablet by mouth every 6 (six) hours as needed for moderate pain., Disp: 90 tablet, Rfl: 0   Multiple Vitamins-Minerals (MULTIVITAMIN PO), Take 1 tablet by mouth daily.,  Disp: , Rfl:    tamsulosin (FLOMAX) 0.4 MG CAPS capsule, TAKE 1 CAPSULE BY MOUTH IN  THE EVENING, Disp: 90 capsule, Rfl: 1   Turmeric 1053 MG TABS, Take 1 tablet by mouth daily., Disp: , Rfl:   Vitals   Vitals:   02/07/22 0933 02/07/22 0934 02/07/22 0950  BP:  (!) 171/81   Pulse:  89 79  Resp:  18 16  Temp: 98.1 F (36.7 C)    TempSrc: Oral    SpO2:  99% 99%     There is no height or weight on file to calculate BMI.  Physical Exam   Exam performed over telemedicine with 2-way video and audio communication and with assistance of bedside RN  Physical Exam Gen: alert, unable to answer orientation questions or follow commands Resp: normal WOB CV: extremities appear well-perfused  Neuro: *MS: alert, unable to answer orientation questions or follow commands *Speech: mute *CN: PERRL 34mm, L forced gaze deviation not overcome by oculocephalics, blinks to threat on L but not R, sensation intact, R UMN facial droop, hearing intact to voice *Motor:   Normal bulk.  No tremor, rigidity or bradykinesia. No pronator drift. LUE no drift, RUE no movement, some movement LLE but not against gravity, withdraws to noxious stimuli in RLE *Sensory: Decreased withdrawal to noxious stimuli on R relative to L. UTA DSS to extinction.  *Coordination:  UTA 2/2 inability to follow commands *Reflexes:  UTA 2/2 tele-exam *Gait: deferred  NIHSS  1a Level of Conscious.: 0 1b LOC Questions: 2 1c LOC Commands: 2 2 Best Gaze: 2 3 Visual: 1 4 Facial Palsy: 1 5a Motor Arm - left: 0 5b Motor Arm - Right: 4 6a Motor Leg - Left: 3 6b Motor Leg - Right: 3 7 Limb Ataxia: 0 8 Sensory: 1 9 Best Language: 3 10 Dysarthria: 2 11 Extinct. and Inatten.: 1  TOTAL: 25   Premorbid mRS = at least 3, ambulates with a walker with frequent falls, was in wheelchair at last family medicine outpatient visit   Labs   CBC:  Recent Labs  Lab 02/07/22 0931  HGB 13.6  HCT 40.0    Basic Metabolic Panel:  Lab  Results  Component Value Date   NA 138 02/07/2022   K 3.7 02/07/2022   CO2 26 12/13/2021   GLUCOSE 145 (H) 02/07/2022   BUN 15 02/07/2022   CREATININE  0.90 02/07/2022   CALCIUM 9.1 12/13/2021   GFRNONAA >60 12/13/2021   GFRAA 73 05/08/2020   Lipid Panel:  Lab Results  Component Value Date   LDLCALC 37 07/01/2021   HgbA1c:  Lab Results  Component Value Date   HGBA1C 5.2 08/20/2021   Urine Drug Screen: No results found for: "LABOPIA", "COCAINSCRNUR", "LABBENZ", "AMPHETMU", "THCU", "LABBARB"  Alcohol Level No results found for: "ETH"   Impression   This is a 88 year old gentleman with past medical history of right carotid stenosis status post CEA, left ICA stenosis last visualized on carotid Dopplers 2000 1460 to 79% stenosis, cervical radiculopathy, hyperlipidemia, hypertension, prediabetes, multiple prior ischemic infarcts in 2005 and 2006 who is BIB EMS with L gaze deviation, R hemiparesis and neglect, and global aphasia. LKW 2200 last night. Patient was outside the window for TNK. NIHSS = 25 on arrival. CT head showed subtle cytotoxic edema in L MCA territory with ASPECTS 9, equivocal hyperdensity of L MCA branch in sylvian fissure. CTP showed core infarct 48cc and additional 41cc area of oligemia. CTA showed near occlusion L ICA and bulb, age indeterminate, acute appearing thrombus in L siphon 2/ severe stenosis, multiple MCA occlusions (mostly M3/4, also M2 bifurcation, and subtle nonocclusive thrombus in M1 segment.  I discussed these findings with neuro-IR Dr. Tommie Sams by phone. Due to his advanced age (88 yo), mRS of 3-4, relatively large core relative to penumbra, multifocal lesions including numerous M3/M4 branch occlusions, and known pre-existing carotid stenosis (due to remote imaging in epic and also no involvement of L ACA territory on CTP) patient was felt to be a poor candidate for intervention.  I discussed all of this with son Cyris Maalouf by phone  [phone]). Patient is married but his wife cannot care for herself 2/2 dementia and son is de Transport planner for his father. We discussed options for next steps including aggressive care with intubation and ICU mgmt vs comfort care. Son states that his father would not want to be kept on life support if he would be a vegetable; I told son that at this point I cannot tell him for sure what his father's outcome will be but that I could confirm 1) we cannot fix the stroke, 2) he would not return to his prior baseline, and 3) he is expected to have significant deficits going forward including speech / ability to communicate. Son is considering supportive care only but has NOT made final decision on this yet, he is on his way to the hospital and should arrive within 30 min.   Recommendations   - ASA 300mg  PR daily incl NOW - Permissive HTN x48 hrs from sx onset or until stroke ruled out by MRI goal BP <220/110. PRN labetalol or hydralazine if BP above these parameters. Avoid oral antihypertensives. - Strict NPO - q4 hr neuro checks - STAT head CT for any change in neuro exam - Tele - MRI brain wo contrast - TTE - Check A1c and LDL + add statin per guidelines - PT/OT/SLP - Stroke education - Amb referral to neurology upon discharge   - I will f/u with Dr. regarding son's decision for supportive care vs aggressive mgmt when son arrives to hospital. If he elects to continue aggressive care patient may require transfer to [facility] neuro ICU which I can facilitate if needed if he develops cerebral edema or is no longer able to protect his airway.  This patient is critically ill and at significant risk  of neurological worsening, death and care requires constant monitoring of vital signs, hemodynamics,respiratory and cardiac monitoring, neurological assessment, discussion with family, other specialists and medical decision making of high complexity. I spent 110 minutes of neurocritical care  time  in the care of  this patient. This was time spent independent of any time provided by nurse practitioner or PA.  Bing Neighborsolleen Bradey Luzier, MD [facility] [phone]  If 7pm- 7am, please page neurology on call as listed in AMION.

## 2022-02-07 NOTE — ED Notes (Signed)
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 Pharmacy aware.

## 2022-02-07 NOTE — Assessment & Plan Note (Addendum)
-  Patient with acute left MCA stroke appreciated -Neurology recommended completed stroke work-up and pursue treatment with dual antiplatelet therapy for 90 days. -A1c 5.4 and lipid panel demonstrating stable cholesterol level with current statin dose. -Continue PT/OT and speech therapy evaluation and follow recommendations. -Has been found high risk for aspiration; and he is pending modified barium swallow test to further determine diet consistency. -Family has once again reiterated no invasive treatment no heroic measures. -Palliative care has been consulted and will follow patient recovery or further decline to determine next venue. -On today's examination patient with expressive aphasia, right-sided hemiparesis affecting mainly his arm unable to follow commands or comprehend. -Continue to follow clinical response. -Patient is DNR/DNI

## 2022-02-07 NOTE — ED Notes (Signed)
EMS called [facility] @ [phone].  Beeped and Called out to CT and Lab @ 0855.

## 2022-02-07 NOTE — Plan of Care (Signed)

## 2022-02-07 NOTE — Assessment & Plan Note (Addendum)
-  No signs of acute infection appreciated -Most likely stress demargination -Repeat CBC in a.m. to follow WBCs trend/stability.

## 2022-02-07 NOTE — Assessment & Plan Note (Addendum)
-  Continue treatment with Alphagan and Xalatan.

## 2022-02-07 NOTE — Progress Notes (Signed)
This RN reached out to night coverage Dr. Dorthula PerfectDelrae Rend regarding systolic in 190's and temp of 51.7. Thought to be permissive HTN. Will inform her of any changes. Paul Bradshaw

## 2022-02-07 NOTE — H&P (Signed)
History and Physical    Patient: Paul Bradshaw ZOX:[id number] DOB: [birth year] DOA: 02/07/2022 DOS: the patient was seen and examined on 02/07/2022 PCP: Sonny Masters, FNP  Patient coming from: Home  Chief Complaint:  Chief Complaint  Patient presents with   Code Stroke   HPI: Paul Bradshaw is a 88 y.o. male with medical history significant of hypertension, BPH, hyperlipidemia, history of carotid artery disease and chronic back pain with radiculopathy; who presented to the emergency department secondary to aphasia, inability to walk and right-sided weakness.  Patient was last seen normal prior to bed on 02/06/2022 (around 9:10 PM).   Symptoms remains present at time of evaluation; there has not been any chest pain, shortness of breath, fever, dysuria, hematuria, melena, hematochezia, abdominal pain, sick contacts or any other complaints.  Work-up in the ED demonstrated positive left acute MCA ischemic stroke with surrounding abnormalities and large vessel occlusion.  Case discussed with neurology service who after talking with patient's son and his age/comorbidities decision was made to treat was treatable and completed stroke work-up without aggressive/invasive intervention.  Overall prognosis is guarded.  Review of Systems: As mentioned in the history of present illness. All other systems reviewed and are negative. Past Medical History:  Diagnosis Date   Aortic regurgitation    Moderate   Arthritis    BPH (benign prostatic hyperplasia)    Cervical disc disease    Cervical radiculopathy    Hyperlipidemia    Hypertension    Prediabetes 02/16/2021   Staphylococcus aureus bacteremia 08/09/2012   TEE negative for vegetation or thrombus February 2014   Stroke [facility]) 2005 or 2006   3   Symptomatic carotid artery stenosis with infarction [facility]) 2005 or 2006   Status post right carotid endarterectomy   Past Surgical History:  Procedure Laterality Date   BACK SURGERY     CAROTID  ENDARTERECTOMY     CATARACT EXTRACTION W/PHACO Left 02/15/2015   Procedure: CATARACT EXTRACTION PHACO AND INTRAOCULAR LENS PLACEMENT LEFT EYE CDE=30.93;  Surgeon: Gemma Payor, MD;  Location: [facility] ORS;  Service: Ophthalmology;  Laterality: Left;   CHOLECYSTECTOMY     EYE SURGERY     KIDNEY STONE SURGERY     LUMBAR LAMINECTOMY/DECOMPRESSION MICRODISCECTOMY Bilateral 01/23/2014   Procedure: LUMBAR LAMINECTOMY/DECOMPRESSION MICRODISCECTOMY 1 LEVEL L5-S1;  Surgeon: Temple Pacini, MD;  Location: [facility] NEURO ORS;  Service: Neurosurgery;  Laterality: Bilateral;  LUMBAR LAMINECTOMY/DECOMPRESSION MICRODISCECTOMY 1 LEVEL L5-S1   TEE WITHOUT CARDIOVERSION N/A 08/12/2012   Procedure: TRANSESOPHAGEAL ECHOCARDIOGRAM (TEE);  Surgeon: Wendall Stade, MD;  Location: [facility] END[facility] SU[facility];  Service: Cardiovascular;  Laterality: N/A;   TEE WITHOUT CARDIOVERSION N/A 06/24/2013   Procedure: TRANSESOPHAGEAL ECHOCARDIOGRAM (TEE);  Surgeon: Jonelle Sidle, MD;  Location: [facility] END[facility];  Service: Endoscopy;  Laterality: N/A;   Social History:  reports that he has never smoked. He has never used smokeless tobacco. He reports that he does not drink alcohol and does not use drugs.  Allergies  Allergen Reactions   Amlodipine Swelling    Swelling of lips.   Lisinopril Other (See Comments)    Elevated BP higher & causes a burning feeling on the inside.     Family History  Problem Relation Age of Onset   Emphysema Father    Alzheimer's disease Mother    Heart disease Brother    Heart attack Brother    COPD Son     Prior to Admission medications   Medication Sig Start Date End Date Taking?  Authorizing Provider  albuterol (VENTOLIN HFA) 108 (90 Base) MCG/ACT inhaler Inhale 2 puffs into the lungs every 6 (six) hours as needed for shortness of breath or wheezing. 08/20/21  Yes Deliah Boston F, FNP  atorvastatin (LIPITOR) 10 MG tablet TAKE ONE-HALF TABLET BY  MOUTH DAILY AT 6 PM 09/12/21  Yes Gwenlyn Fudge, FNP  brimonidine  (ALPHAGAN) 0.2 % ophthalmic solution Place 1 drop into both eyes 3 (three) times daily. 10/08/21  Yes [provider]  cetirizine (ZYRTEC) 10 MG tablet Take 10 mg by mouth daily.   Yes [provider]  clopidogrel (PLAVIX) 75 MG tablet TAKE 1 TABLET BY MOUTH  DAILY WITH BREAKFAST 09/12/21  Yes Deliah Boston F, FNP  dorzolamide-timolol (COSOPT) 22.3-6.8 MG/ML ophthalmic solution Place 1 drop into both eyes 2 (two) times daily. 10/08/21  Yes [provider]  finasteride (PROSCAR) 5 MG tablet Take 1 tablet (5 mg total) by mouth daily. 08/19/21  Yes Jerilee Field, MD  HYDROcodone-acetaminophen (NORCO) 7.5-325 MG tablet Take 1 tablet by mouth every 6 (six) hours as needed for moderate pain. 01/14/22  Yes Gwenlyn Fudge, FNP  latanoprost (XALATAN) 0.005 % ophthalmic solution Place 1 drop into both eyes every evening. 01/31/22  Yes [provider]  Multiple Vitamins-Minerals (MULTIVITAMIN PO) Take 1 tablet by mouth daily.   Yes [provider]  tamsulosin (FLOMAX) 0.4 MG CAPS capsule TAKE 1 CAPSULE BY MOUTH IN  THE EVENING 09/12/21  Yes Deliah Boston F, FNP  Turmeric 1053 MG TABS Take 1 tablet by mouth daily.   Yes [provider]  acetaminophen (TYLENOL) 500 MG tablet Take 1,000 mg by mouth every 8 (eight) hours as needed for moderate pain.    [provider]  HYDROcodone-acetaminophen (NORCO) 7.5-325 MG tablet Take 1 tablet by mouth every 6 (six) hours as needed for moderate pain. Patient not taking: Reported on 02/07/2022 02/13/22   Gwenlyn Fudge, FNP  HYDROcodone-acetaminophen (NORCO) 7.5-325 MG tablet Take 1 tablet by mouth every 6 (six) hours as needed for moderate pain. Patient not taking: Reported on 02/07/2022 03/15/22   Gwenlyn Fudge, FNP    Physical Exam: Vitals:   02/07/22 1230 02/07/22 1232 02/07/22 1300 02/07/22 1327  BP: (!) 152/74  (!) 162/80 (!) 172/86  Pulse: 78  91 96  Resp: 18  19 19   Temp:  98.7 F (37.1 C)  98.7  F (37.1 C)  TempSrc:  Oral  Oral  SpO2: 98%  97% 98%  Weight:   67.9 kg   Height:   5\' 5"  (1.651 m)    General exam: No fever, no chest pain, no nausea vomiting; able to follow very simple commands but unable to communicate. Respiratory system: Clear to auscultation. Respiratory effort normal.  Good saturation on room air. Cardiovascular system: Rate controlled, no rubs, no gallops, no JVD on exam. Gastrointestinal system: Abdomen is nondistended, soft and nontender. No organomegaly or masses felt. Normal bowel sounds heard. Central nervous system: Oriented x1; right-sided hemiparesis and the collection appreciated.  Positive aphasia. Extremities: No cyanosis or clubbing Skin: No petechiae. Psychiatry: Unable to properly assess given ongoing aphasia.  Data Reviewed: PT 14.6; INR 1.2 CBC: WBCs 11.8, hemoglobin 12.9 platelet count 235 K Comprehensive metabolic panel: Sodium 138, potassium 3.6, chloride 103, bicarb 25, BUN 17, creatinine 0.92.  Normal LFTs A1c 5.4 COVID/influenza PCR negative. Urinalysis demonstrating increased specific gravity (suggesting mild dehydration); no signs of acute infection.  Assessment and Plan: * Acute ischemic stroke [facility]) - Patient  with acute left MCA stroke appreciated -Neurology service has recommended the use of aspirin and just complete stroke work-up; patient not on therapeutic window. -Based on the findings and extension, along with his age prognosis is very guarded. -After discussing with family members decision has been made not to pursued aggressive intervention and just to see how he does.  Patient is DNR/DNI -Will complete a stroke work-up and treat what is treatable. -2D echo, MRI/MRA, A1c and lipid panel has been ordered. -PT, OT and speech therapy pending to see patient. -Allow for permissive hypertension and provide supportive care. -Aspirin suppository has been recommended at this point.  Glaucoma - Continue treatment with Alphagan  and Xalatan   Essential hypertension - Mostly borderline and no using antihypertensive agents as an outpatient -Allowing permissive hypertension in the setting of acute ischemic stroke -Follow-up vital signs and determine the need of low-dose blood pressure medication.  Pressure injury of skin - Stage I peritoneal medial pressure injury appreciated at time of admission -No signs of superimposed infection -Continue constant repositioning, preventive measures and local care.  Leukocytosis - No signs of acute infection appreciated -Most likely stress demargination - fluid resuscitation will be provided -Follow WBCs trend.  BPH (benign prostatic hyperplasia) - Once able to tolerate oral meds will resume home Flomax and Proscar. -No complaints of urinary retention currently.  Social/ethics -Patient prognosis is guarded and and he is at high risk for decompensation. -CODE STATUS discussed with patient's son and decision made for DNR/DNI and noninvasive intervention or artificial nutrition. -Will continue to treat was treatable and see how he responds.    Advance Care Planning:   Code Status: DNR   Consults: neurology   Family Communication: son at bedside   Severity of Illness: The appropriate patient status for this patient is INPATIENT. Inpatient status is judged to be reasonable and necessary in order to provide the required intensity of service to ensure the patient's safety. The patient's presenting symptoms, physical exam findings, and initial radiographic and laboratory data in the context of their chronic comorbidities is felt to place them at high risk for further clinical deterioration. Furthermore, it is not anticipated that the patient will be medically stable for discharge from the hospital within 2 midnights of admission.   * I certify that at the point of admission it is my clinical judgment that the patient will require inpatient hospital care spanning beyond 2  midnights from the point of admission due to high intensity of service, high risk for further deterioration and high frequency of surveillance required.*  Author: Vassie Loll, MD 02/07/2022 5:25 PM  For on call review [email or web address].

## 2022-02-07 NOTE — Progress Notes (Signed)
[id number] call time 0857 beeper time 0916 exam started 0917 exam finished 0918 images sent to soc 0920 exam complete in epic 0920 [facility] radiology called

## 2022-02-07 NOTE — Assessment & Plan Note (Signed)
-  No complaints of urinary retention -Resume the use of Flomax and Proscar.

## 2022-02-07 NOTE — Assessment & Plan Note (Addendum)
-  Stage I perineum/medial sacrum pressure injury appreciated at time of admission. -No signs of superimposed infection -Continue constant repositioning, preventive measures and local care.

## 2022-02-07 NOTE — Progress Notes (Signed)
Telestroke RN code stroke note: [phone]- Code stroke activation via stroke cart- EMS pre alert ETA 10-28mins [phone]- Dr Selina Cooley paged [phone]-Pt arrives via EMS- Dr Rhunette Croft is at bedside. [phone]- Dr Selina Cooley on camera, begins stroke assessment w/ assistance from Dr Rhunette Croft.NIH completed at this time: NIHSS   1a Level of Conscious.: 0 1b LOC Questions: 2 1c LOC Commands: 2 2 Best Gaze: 2 3 Visual: 1 4 Facial Palsy: 1 5a Motor Arm - left: 0 5b Motor Arm - Right: 4 6a Motor Leg - Left: 3 6b Motor Leg - Right: 3 7 Limb Ataxia: 0 8 Sensory: 1 9 Best Language: 3 10 Dysarthria: 2 11 Extinct. and Inatten.: 1   TOTAL: 25     Premorbid mRS = at least 3, ambulates with a walker with frequent falls, was in wheelchair at last family medicine outpatient visit  0910-Taken for CT, stroke cart taken to CT as well.   0945-Code stroke ended- Dr Selina Cooley has reviewed all imaging and discussed plan w/ NIR and EDP.

## 2022-02-08 ENCOUNTER — Inpatient Hospital Stay (HOSPITAL_COMMUNITY): Payer: Medicare Other

## 2022-02-08 DIAGNOSIS — I10 Essential (primary) hypertension: Secondary | ICD-10-CM | POA: Diagnosis not present

## 2022-02-08 DIAGNOSIS — I639 Cerebral infarction, unspecified: Secondary | ICD-10-CM | POA: Diagnosis not present

## 2022-02-08 DIAGNOSIS — I6389 Other cerebral infarction: Secondary | ICD-10-CM | POA: Diagnosis not present

## 2022-02-08 DIAGNOSIS — N400 Enlarged prostate without lower urinary tract symptoms: Secondary | ICD-10-CM | POA: Diagnosis not present

## 2022-02-08 DIAGNOSIS — H409 Unspecified glaucoma: Secondary | ICD-10-CM | POA: Diagnosis not present

## 2022-02-08 LAB — LIPID PANEL
Cholesterol: 105 mg/dL (ref 0–200)
HDL: 62 mg/dL (ref >40)
LDL Cholesterol: 34 mg/dL (ref 0–99)
Total CHOL/HDL Ratio: 1.7 RATIO
Triglycerides: 47 mg/dL (ref <150)
VLDL: 9 mg/dL (ref 0–40)

## 2022-02-08 LAB — ECHOCARDIOGRAM COMPLETE
AR max vel: 1.65 cm2
AV Area VTI: 1.65 cm2
AV Area mean vel: 1.4 cm2
AV Mean grad: 10 mmHg
AV Peak grad: 19.8 mmHg
Ao pk vel: 2.23 m/s
Area-P 1/2: 6.37 cm2
Height: 65 in
MV VTI: 1.71 cm2
P 1/2 time: 283 msec
S' Lateral: 2.4 cm
Weight: 2395.08 oz

## 2022-02-08 LAB — GLUCOSE, CAPILLARY
Glucose-Capillary: 108 mg/dL — ABNORMAL HIGH (ref 70–99)
Glucose-Capillary: 89 mg/dL (ref 70–99)
Glucose-Capillary: 89 mg/dL (ref 70–99)

## 2022-02-08 LAB — BASIC METABOLIC PANEL
Anion gap: 11 (ref 5–15)
BUN: 18 mg/dL (ref 8–23)
CO2: 22 mmol/L (ref 22–32)
Calcium: 8.5 mg/dL — ABNORMAL LOW (ref 8.9–10.3)
Chloride: 103 mmol/L (ref 98–111)
Creatinine, Ser: 0.76 mg/dL (ref 0.61–1.24)
GFR, Estimated: >60 mL/min (ref >60)
Glucose, Bld: 108 mg/dL — ABNORMAL HIGH (ref 70–99)
Potassium: 3.6 mmol/L (ref 3.5–5.1)
Sodium: 136 mmol/L (ref 135–145)

## 2022-02-08 MED ORDER — ASPIRIN 325 MG PO TABS
325.0000 mg | ORAL_TABLET | Freq: Every day | ORAL | Status: DC
  Filled 2022-02-08 (×2): qty 1

## 2022-02-08 MED ORDER — CLOPIDOGREL BISULFATE 75 MG PO TABS
75.0000 mg | ORAL_TABLET | Freq: Every day | ORAL | Status: DC
  Administered 2022-02-13 – 2022-02-17 (×5): 75 mg via ORAL
  Filled 2022-02-08 (×9): qty 1

## 2022-02-08 MED ORDER — TAMSULOSIN HCL 0.4 MG PO CAPS
0.4000 mg | ORAL_CAPSULE | Freq: Every day | ORAL | Status: DC
  Filled 2022-02-08: qty 1

## 2022-02-08 MED ORDER — PANTOPRAZOLE SODIUM 40 MG PO TBEC
40.0000 mg | DELAYED_RELEASE_TABLET | Freq: Two times a day (BID) | ORAL | Status: DC
  Filled 2022-02-08: qty 1

## 2022-02-08 MED ORDER — FINASTERIDE 5 MG PO TABS
5.0000 mg | ORAL_TABLET | Freq: Every day | ORAL | Status: DC
  Filled 2022-02-08: qty 1

## 2022-02-08 MED ORDER — ATORVASTATIN CALCIUM 10 MG PO TABS
10.0000 mg | ORAL_TABLET | Freq: Every day | ORAL | Status: DC
  Administered 2022-02-12 – 2022-02-16 (×5): 10 mg via ORAL
  Filled 2022-02-08 (×6): qty 1

## 2022-02-08 NOTE — Plan of Care (Signed)
  Problem: Acute Rehab PT Goals(only PT should resolve) Goal: Pt Will Go Supine/Side To Sit Outcome: Progressing Flowsheets (Taken 02/08/2022 1154) Pt will go Supine/Side to Sit: with minimal assist Goal: Pt Will Go Sit To Supine/Side Outcome: Progressing Flowsheets (Taken 02/08/2022 1154) Pt will go Sit to Supine/Side: with minimal assist Goal: Patient Will Transfer Sit To/From Stand Outcome: Progressing Flowsheets (Taken 02/08/2022 1154) Patient will transfer sit to/from stand:  with minimal assist  with moderate assist Goal: Pt Will Transfer Bed To Chair/Chair To Bed Outcome: Progressing Flowsheets (Taken 02/08/2022 1154) Pt will Transfer Bed to Chair/Chair to Bed:  with min assist  with mod assist Goal: Pt Will Ambulate Outcome: Progressing Flowsheets (Taken 02/08/2022 1154) Pt will Ambulate:  25 feet  with minimal assist  with moderate assist  with least restrictive assistive device Goal: Pt/caregiver will Perform Home Exercise Program Outcome: Progressing Flowsheets (Taken 02/08/2022 1154) Pt/caregiver will Perform Home Exercise Program:  For increased strengthening  For improved balance  With Supervision, verbal cues required/provided  11:55 AM, 02/08/22 Wyman Songster PT, DPT Physical Therapist at [facility]

## 2022-02-08 NOTE — Evaluation (Signed)
Clinical/Bedside Swallow Evaluation Patient Details  Name: Paul Bradshaw MRN: [id number] Date of Birth: 06-17-1926  Today's Date: 02/08/2022 Time: SLP Start Time (ACUTE ONLY): 1540 SLP Stop Time (ACUTE ONLY): 1612 SLP Time Calculation (min) (ACUTE ONLY): 32 min  Past Medical History:  Past Medical History:  Diagnosis Date   Aortic regurgitation    Moderate   Arthritis    BPH (benign prostatic hyperplasia)    Cervical disc disease    Cervical radiculopathy    Hyperlipidemia    Hypertension    Prediabetes 02/16/2021   Staphylococcus aureus bacteremia 08/09/2012   TEE negative for vegetation or thrombus February 2014   Stroke [facility]) 2005 or 2006   3   Symptomatic carotid artery stenosis with infarction ([facility]) 2005 or 2006   Status post right carotid endarterectomy   Past Surgical History:  Past Surgical History:  Procedure Laterality Date   BACK SURGERY     CAROTID ENDARTERECTOMY     CATARACT EXTRACTION W/PHACO Left 02/15/2015   Procedure: CATARACT EXTRACTION PHACO AND INTRAOCULAR LENS PLACEMENT LEFT EYE CDE=30.93;  Surgeon: Gemma Payor, MD;  Location: [facility] OR[facility];  Service: Ophthalmology;  Laterality: Left;   CHOLECYSTECTOMY     EYE SURGERY     KIDNEY STONE SURGERY     LUMBAR LAMINECTOMY/DECOMPRESSION MICRODISCECTOMY Bilateral 01/23/2014   Procedure: LUMBAR LAMINECTOMY/DECOMPRESSION MICRODISCECTOMY 1 LEVEL L5-S1;  Surgeon: Temple Pacini, MD;  Location: [facility] NEUR[facility];  Service: Neurosurgery;  Laterality: Bilateral;  LUMBAR LAMINECTOMY/DECOMPRESSION MICRODISCECTOMY 1 LEVEL L5-S1   TEE WITHOUT CARDIOVERSION N/A 08/12/2012   Procedure: TRANSESOPHAGEAL ECHOCARDIOGRAM (TEE);  Surgeon: Wendall Stade, MD;  Location: [facility];  Service: Cardiovascular;  Laterality: N/A;   TEE WITHOUT CARDIOVERSION N/A 06/24/2013   Procedure: TRANSESOPHAGEAL ECHOCARDIOGRAM (TEE);  Surgeon: Jonelle Sidle, MD;  Location: [facility];  Service: Endoscopy;  Laterality: N/A;   HPI:  Paul Bradshaw is a 88  y.o. male with medical history significant of hypertension, BPH, hyperlipidemia, history of carotid artery disease and chronic back pain with radiculopathy; who presented to the emergency department secondary to aphasia, inability to walk and right-sided weakness.  Patient was last seen normal prior to bed on 02/06/2022 (around 9:10 PM). noted to have MCA infarct and several occlusions on LVO. BSE and SLE ordred.    Assessment / Plan / Recommendation  Clinical Impression  Clinical swallowing evaluation completed while Pt was sitting upright in bed; Pt is lethargic but did rouse for evaluation. Pt consumed thin liquids with significant anterior spillage, suspected delayed swallow, immediate cough and wet throat clearing. Pt consumed tsp sips of HTL with prolonged oral prep staged followed by seemingly timely swallow. Pt consumed puree textures with poor coordination but with prolonged time did clear oral cavity and residue. Pt did require mod cues to remain alert and trials were terminated after Pt became increasingly lethargic. Recommen initiate D1/puree diet with HONEY thick liquid by TSP ONLY when Pt is alert and responsive and sitting upright. AT this time recommend only nursing staff feed Pt. MEds should be crushed in puree. Above to RN  Acknowledge SLE, Pt was not alert/responsive enough after BSE to complete at this time. ST will continue efforts.   SLP Visit Diagnosis: Dysphagia, unspecified (R13.10)    Aspiration Risk  Mild aspiration risk    Diet Recommendation Dysphagia 1 (Puree);Honey-thick liquid   Liquid Administration via: Spoon Medication Administration: Crushed with puree Supervision: Full supervision/cueing for compensatory strategies Compensations: Minimize environmental distractions;Slow rate;Small sips/bites Postural Changes: Seated upright  at 90 degrees    Other  Recommendations Oral Care Recommendations: Oral care BID Other Recommendations: Order thickener from pharmacy     Recommendations for follow up therapy are one component of a multi-disciplinary discharge planning process, led by the attending physician.  Recommendations may be updated based on patient status, additional functional criteria and insurance authorization.  Follow up Recommendations Acute inpatient rehab (3hours/day)      Assistance Recommended at Discharge    Functional Status Assessment    Frequency and Duration min 2x/week  1 week       Prognosis Prognosis for Safe Diet Advancement: Fair Barriers to Reach Goals: Cognitive deficits;Language deficits;Severity of deficits      Swallow Study   General HPI: Paul Bradshaw is a 88 y.o. male with medical history significant of hypertension, BPH, hyperlipidemia, history of carotid artery disease and chronic back pain with radiculopathy; who presented to the emergency department secondary to aphasia, inability to walk and right-sided weakness.  Patient was last seen normal prior to bed on 02/06/2022 (around 9:10 PM). noted to have MCA infarct and several occlusions on LVO. BSE and SLE ordred. Type of Study: Bedside Swallow Evaluation Previous Swallow Assessment: none in hcart Diet Prior to this Study: NPO Temperature Spikes Noted: No Respiratory Status: Room air History of Recent Intubation: No Behavior/Cognition: Alert;Cooperative;Confused Oral Cavity Assessment: Within Functional Limits Oral Care Completed by SLP: Recent completion by staff Vision: Functional for self-feeding Self-Feeding Abilities: Needs assist;Total assist Patient Positioning: Upright in bed Baseline Vocal Quality: Not observed Volitional Cough: Congested;Weak Volitional Swallow: Unable to elicit    Oral/Motor/Sensory Function Overall Oral Motor/Sensory Function: Severe impairment Facial ROM: Reduced right Facial Symmetry: Abnormal symmetry right   Ice Chips Ice chips: Within functional limits   Thin Liquid Thin Liquid: Impaired Presentation: Cup Oral Phase  Impairments: Reduced lingual movement/coordination;Reduced labial seal Oral Phase Functional Implications: Right anterior spillage Pharyngeal  Phase Impairments: Wet Vocal Quality;Cough - Delayed;Throat Clearing - Delayed;Throat Clearing - Immediate    Nectar Thick Nectar Thick Liquid: Not tested   Honey Thick Honey Thick Liquid: Impaired Presentation: Spoon Oral Phase Impairments: Reduced lingual movement/coordination;Reduced labial seal;Poor awareness of bolus   Puree Puree: Impaired Presentation: Spoon Oral Phase Impairments: Reduced lingual movement/coordination;Poor awareness of bolus;Reduced labial seal   Solid     Solid: Not tested     Mahiya Kercheval H. Romie Levee, CCC-SLP Speech Language Pathologist  Georgetta Haber 02/08/2022,4:12 PM

## 2022-02-08 NOTE — Evaluation (Signed)
Physical Therapy Evaluation Patient Details Name: Paul Bradshaw MRN: [id number] DOB: 1926-02-10 Today's Date: 02/08/2022  History of Present Illness  Paul Bradshaw is a 88 y.o. male with medical history significant of hypertension, BPH, hyperlipidemia, history of carotid artery disease and chronic back pain with radiculopathy; who presented to the emergency department secondary to aphasia, inability to walk and right-sided weakness.  Patient was last seen normal prior to bed on 02/06/2022 (around 9:10 PM).   Clinical Impression  Patient limited for functional mobility as stated below secondary to BLE weakness, fatigue and poor standing balance. Patient with expressive aphasia and HOH but able to follow most commands. While supine patient demonstrating good L side mobility with cueing but minimal right sided movement with flaccid RUE. He requires cueing and mod assist to transition to seated EOB where he demonstrates good sitting balance with slight assist initially but then independently. Patient then demonstrating good bilateral LE strength and completes seated exercises while maintaining good balance. Patient able to transfer to standing and maintain standing with mod/max assist and use of RW with LUE. Patient assisted back to bed at end of session and is limited by fatigue. Patient will benefit from continued physical therapy in hospital and recommended venue below to increase strength, balance, endurance for safe ADLs and gait.        Recommendations for follow up therapy are one component of a multi-disciplinary discharge planning process, led by the attending physician.  Recommendations may be updated based on patient status, additional functional criteria and insurance authorization.  Follow Up Recommendations Acute inpatient rehab (3hours/day)      Assistance Recommended at Discharge Frequent or constant Supervision/Assistance  Patient can return home with the following  A lot of help with  walking and/or transfers;A lot of help with bathing/dressing/bathroom    Equipment Recommendations None recommended by PT  Recommendations for Other Services  OT consult;Speech consult    Functional Status Assessment Patient has had a recent decline in their functional status and demonstrates the ability to make significant improvements in function in a reasonable and predictable amount of time.     Precautions / Restrictions Precautions Precautions: Fall Restrictions Weight Bearing Restrictions: No      Mobility  Bed Mobility Overal bed mobility: Needs Assistance Bed Mobility: Supine to Sit, Sit to Supine     Supine to sit: Mod assist, HOB elevated Sit to supine: Mod assist   General bed mobility comments: assist for RLE to EOB and to upright trunk, frequent cueing provided with fair carry over    Transfers Overall transfer level: Needs assistance Equipment used: Rolling walker (2 wheels) Transfers: Sit to/from Stand Sit to Stand: Mod assist, Max assist           General transfer comment: requires assist to power up to standing for weakness, cueing for sequencing and hand placement    Ambulation/Gait                  Stairs            Wheelchair Mobility    Modified Rankin (Stroke Patients Only)       Balance Overall balance assessment: Needs assistance Sitting-balance support: No upper extremity supported, Feet supported Sitting balance-Leahy Scale: Good Sitting balance - Comments: good/fair seated EOB   Standing balance support: Single extremity supported, Reliant on assistive device for balance Standing balance-Leahy Scale: Poor Standing balance comment: with RW  Pertinent Vitals/Pain Pain Assessment Facial Expression: Relaxed, neutral Body Movements: Absence of movements Muscle Tension: Relaxed    Home Living Family/patient expects to be discharged to:: Private residence Living  Arrangements: Spouse/significant other Available Help at Discharge: Family;Available PRN/intermittently Type of Home: House Home Access: Stairs to enter;Ramped entrance Entrance Stairs-Rails: Right Entrance Stairs-Number of Steps: 6   Home Layout: One level Home Equipment: Cane - single point;Shower Counsellor (2 wheels) Additional Comments: info taken from prior admission    Prior Function Prior Level of Function : Independent/Modified Independent             Mobility Comments: Household and short distanced community ambulator using RW PRN ADLs Comments: ind with ADLs/IADLs, cares for disabled spouse     Hand Dominance        Extremity/Trunk Assessment   Upper Extremity Assessment Upper Extremity Assessment: Defer to OT evaluation    Lower Extremity Assessment Lower Extremity Assessment: Generalized weakness;RLE deficits/detail RLE Deficits / Details: minimal movement in supine only being able to plantarflex/dorsiflex foot in minimal range; in seated EOB able to complete knee extension 4/5 MMT    Cervical / Trunk Assessment Cervical / Trunk Assessment: Kyphotic  Communication   Communication: HOH;Expressive difficulties  Cognition Arousal/Alertness: Awake/alert Behavior During Therapy: WFL for tasks assessed/performed, Flat affect Overall Cognitive Status: Impaired/Different from baseline                                          General Comments      Exercises General Exercises - Lower Extremity Ankle Circles/Pumps: AROM, Both, 5 reps, Seated Long Arc Quad: AROM, Both, 15 reps, Seated Hip Flexion/Marching: AROM, Both, 15 reps, Seated   Assessment/Plan    PT Assessment Patient needs continued PT services  PT Problem List Decreased strength;Decreased activity tolerance;Decreased balance;Decreased cognition       PT Treatment Interventions Balance training;Gait training;Neuromuscular re-education;DME instruction;Stair  training;Functional mobility training;Patient/family education;Therapeutic activities;Therapeutic exercise;Manual techniques    PT Goals (Current goals can be found in the Care Plan section)  Acute Rehab PT Goals Patient Stated Goal: none stated PT Goal Formulation: With patient Time For Goal Achievement: 03/01/22 Potential to Achieve Goals: Fair    Frequency Min 3X/week     Co-evaluation               AM-PAC PT "6 Clicks" Mobility  Outcome Measure Help needed turning from your back to your side while in a flat bed without using bedrails?: A Little Help needed moving from lying on your back to sitting on the side of a flat bed without using bedrails?: A Lot Help needed moving to and from a bed to a chair (including a wheelchair)?: A Lot Help needed standing up from a chair using your arms (e.g., wheelchair or bedside chair)?: A Lot Help needed to walk in hospital room?: A Lot Help needed climbing 3-5 steps with a railing? : A Lot 6 Click Score: 13    End of Session Equipment Utilized During Treatment: Gait belt Activity Tolerance: Patient tolerated treatment well;Patient limited by fatigue Patient left: in bed;with family/visitor present;with call bell/phone within reach;with bed alarm set Nurse Communication: Mobility status PT Visit Diagnosis: Unsteadiness on feet (R26.81);Other abnormalities of gait and mobility (R26.89);Muscle weakness (generalized) (M62.81);Other symptoms and signs involving the nervous system (R29.898)    Time: 9604-5409 PT Time Calculation (min) (ACUTE ONLY): 32 min   Charges:  PT Evaluation $PT Eval Moderate Complexity: 1 Mod PT Treatments $Therapeutic Exercise: 8-22 mins $Therapeutic Activity: 8-22 mins        11:53 AM, 02/08/22 Wyman Songster PT, DPT Physical Therapist at [facility]

## 2022-02-08 NOTE — Progress Notes (Signed)
Paged to bedside for worsening neuro exam.  Reportedly patient has been able to hold up his left arm and left leg earlier, but on this neuro check he had left arm drift and left leg drift as well as being more somnolent.  Stat CT head was done that does not show any new acute infarct or hemorrhagic conversion.  It does show edema with mass effect.  I spoke with family.  The son reports that he is very close with his father.  He would still like to treat the treatable, and not proceed with comfort care at this time.  Understanding was questionable as the son said that it sounded like the father was recovering.  I had to correct him and let him know that this is a worsening finding not a better finding.  I spoke with neurology at [facility] regarding any further treatment for the edema i.e. hypertonic saline.  Based on the notes and from what family discussed with me, it was decided that hypertonic saline would be more aggressive in the family's wishes, and also would not change his course.  Palliative consult was recommended and placed.  Lastly neuro recommended avoiding hypotonic fluids such as D5, but instead to use D5 and normal saline if needed.  We will follow-up BMP to assess current sodium level.  All this was discussed with the bedside nurse.  We will do every 2 neurochecks 2 extra times, and then if no further changes we will move to every 4 neurochecks.  We will continue to monitor.

## 2022-02-08 NOTE — Progress Notes (Signed)
Evening medications not given, see MAR. Patient is not alert enough this evening to take medications. ASA suppository was given earlier in the day.

## 2022-02-08 NOTE — Progress Notes (Signed)
*  PRELIMINARY RESULTS* Echocardiogram 2D Echocardiogram has been performed.  [name] 02/08/2022, 8:50 AM

## 2022-02-08 NOTE — Progress Notes (Signed)
Inpatient Rehab Admissions Coordinator:  ? ?Per therapy recommendations,  patient was screened for CIR candidacy by Emberli Ballester, MS, CCC-SLP. At this time, Pt. Appears to be a a potential candidate for CIR. I will place   order for rehab consult per protocol for full assessment. Please contact me any with questions. ? ?Shantell Belongia, MS, CCC-SLP ?Rehab Admissions Coordinator  ?[phone] (celll) ?[phone] (office) ? ?

## 2022-02-08 NOTE — Progress Notes (Signed)
0031- Stroke cart activated by IP provider. Pt was code stroke yesterday, noted to have MCA infarct and several occlusions on LVO, Dr Selina Cooley saw pt and discussed with IR who declined for intervention. Long note in chart about discussion by Dr Selina Cooley about pts poor prognosis and pt won't return to baseline and will likely continue to decline.  Discussed with IP provider need for teleneuro to see patient, as no acute intervention would be warranted at this point, she opted to wait until we got NCCT read back. 43- Called radiology and spoke with reading room to ensure scan was being read, stated it would be coming up shortly. 7654- NCCt neg for ICH but now showing a mild shift with edema.  Advised IP provider that we could get teleneuro on if she wanted to, but wasn't sure what they would advise at this point. IP provider stated she was going to call patient's son and would activate the cart again if she decided to have teleneuro see patient.  Telestroke Charity fundraiser

## 2022-02-08 NOTE — Progress Notes (Signed)
  Transition of Care (TOC) Screening Note   Patient Details  Name: Paul Bradshaw Date of Birth: [birth year]   Transition of Care [facility]) CM/SW Contact:    Annice Needy, LCSW Phone Number: 02/08/2022, 1:27 PM    Transition of Care Department [facility]) has reviewed patient and no TOC needs have been identified at this time. We will continue to monitor patient advancement through interdisciplinary progression rounds. If new patient transition needs arise, please place a TOC consult.

## 2022-02-08 NOTE — Progress Notes (Signed)
Pt currently more alert. He is able to nod head yes or no occasionally to answer questions however, it is not consistent. No drift present in Left arm, left leg, or Right leg. No effort against gravity in R arm.  Dry mouth present and patient is swallowing hard. Mouth care performed and moisturizer applied. Paul Bradshaw

## 2022-02-08 NOTE — Progress Notes (Signed)
Progress Note   Patient: Paul Bradshaw:[id number] DOB: 1926/03/16 DOA: 02/07/2022     1 DOS: the patient was seen and examined on 02/08/2022   Brief hospital course: No notes on file  Assessment and Plan: * Acute ischemic stroke [facility]) -Patient with acute left MCA stroke appreciated -Neurology recommended completed stroke work-up and pursue treatment with dual antiplatelet therapy for 90 days. -A1c 5.4 and lipid panel demonstrating stable cholesterol level with current statin dose. -Continue PT/OT and speech therapy evaluation and recommendations. -Has been found high risk for aspiration with recommendation for dysphagia 1 with thin liquids. -Tentative CIR referral in place. -Patient is DNR/DNI -Continue allowing permissive hypertension and provide supportive care.  Glaucoma -Continue treatment with Alphagan and Xalatan   Essential hypertension -Mostly borderline and no using antihypertensive agents as an outpatient -Allowing permissive hypertension in the setting of acute ischemic stroke. -Follow-up vital signs and determine the need for antihypertensive agents.  Pressure injury of skin -Stage I perineum/medial sacrum pressure injury appreciated at time of admission. -No signs of superimposed infection -Continue constant repositioning, preventive measures and local care.  Leukocytosis -No signs of acute infection appreciated -Most likely stress demargination -WBCs trend/stability.   BPH (benign prostatic hyperplasia) -No complaints of urinary retention -Resume the use of Flomax and Proscar.   Subjective:  Overnight with left-sided pronator drift and further acute neurologic changes triggering repeat CT scan which demonstrated the presence of swelling and a slight midline shift.  Decisions made not to pursued any invasive treatment.  This morning appears to be better than yesterday night and is able to follow simple commands by nodding and squeezing his left hand.   Positive right facial droop, expressive aphasia and right hemiparesis.  Physical Exam: Vitals:   02/08/22 0333 02/08/22 0553 02/08/22 1007 02/08/22 1257  BP: (!) 177/81 (!) 165/80 (!) 188/93 (!) 161/78  Pulse: 100 (!) 105 (!) 109 98  Resp: 19  18 19   Temp: 98.2 F (36.8 C)  99.7 F (37.6 C) 98.7 F (37.1 C)  TempSrc: Oral  Oral Oral  SpO2: 95%   96%  Weight:      Height:       General exam: Alert, awake, able to follow very simple commands by squeezing hands and intermittently nodding with his head.  Continues to demonstrate right facial droop, right hemiparesis and speech aphasia. Respiratory system: Clear to auscultation. Respiratory effort normal.  Good saturation on room air. Cardiovascular system: Sinus tachycardia, no rubs, no gallops, no JVD. Gastrointestinal system: Abdomen is nondistended, soft and nontender. No organomegaly or masses felt. Normal bowel sounds heard. Central nervous system: Resetting hemiparesis, right facial droop, muscle strength on his right lower extremity 3-/5; 0 out of 5 on his right upper extremity; 4 out of 5 appreciated on his left upper and lower extremities.  Expressive aphasia initiated. Extremities: No cyanosis or clubbing. Skin: No petechiae.  Stage I perineum/sacral pressure injury appreciated at time of admission without signs of superimposed infection. Psychiatry: Unable to properly assess with current neurologic deficits.  Data Reviewed: Repeat CT scan demonstrating a slight midline shift and swelling.  No hemorrhagic transformation. Case discussed with family member (his son at bedside) which at this moment would like to continue treating was treatable without pursuing invasive intervention. Lipid panel: Total cholesterol 105, triglycerides 47,  HDL 62, LDL 34 Basic metabolic panel: Sodium 136, potassium 3.6, chloride 103, bicarb 22, BUN 18, creatinine 0.76. A1c: 5.4  Family Communication: Son at bedside.   Disposition: Status  is:  Inpatient Remains inpatient appropriate because: Completing stroke work-up and evaluation for safe discharge by physical therapy and speech therapy.  Continue to be high risk for aspiration and decompensation given underlying comorbidities/age and current presentation.   Planned Discharge Destination: To be determined; currently based on initial evaluation by physical therapy CIR has been suggested; Plan B will be skilled nursing facility for rehabilitation, conditioning and further care.   Author: Vassie Loll, MD 02/08/2022 5:16 PM  For on call review [email or web address].

## 2022-02-08 NOTE — Plan of Care (Signed)
Dicussed with Dr. Marnee Spring  Nursing concerned about worsening examination: Upon re-assessment patient has had a change in symptoms. NIH score now 30. Pt responsive to painful stimuli, drift now present in left arm,left leg, and Rt leg no effort against gravity. Pupils equal and sluggish. Slight upward gaze noted and no longer tracks horizontal movement like as he did earlier but did turn to face Rn after being asked repeatedly. Pt does not follow commands. Dr. Sheliah Plane confirms this examination  Per Dr. Darlyn Read exam previously: "Neuro: *MS: alert, unable to answer orientation questions or follow commands *Speech: mute *CN: PERRL 55mm, L forced gaze deviation not overcome by oculocephalics, blinks to threat on L but not R, sensation intact, R UMN facial droop, hearing intact to voice *Motor:   Normal bulk.  No tremor, rigidity or bradykinesia. No pronator drift. LUE no drift, RUE no movement, some movement LLE but not against gravity, withdraws to noxious stimuli in RLE *Sensory: Decreased withdrawal to noxious stimuli on R relative to L. UTA DSS to extinction.  *Coordination:  UTA 2/2 inability to follow commands *Reflexes:  UTA 2/2 tele-exam *Gait: deferred   NIHSS 1a Level of Conscious.: 0 1b LOC Questions: 2 1c LOC Commands: 2 2 Best Gaze: 2 3 Visual: 1 4 Facial Palsy: 1 5a Motor Arm - left: 0 5b Motor Arm - Right: 4 6a Motor Leg - Left: 3 6b Motor Leg - Right: 3 7 Limb Ataxia: 0 8 Sensory: 1 9 Best Language: 3 10 Dysarthria: 2 11 Extinct. and Inatten.: 1"  Stat head CT personally reviewed, agree with radiology: "Brain: Left MCA territory hypodensity with mild edema in keeping with known acute stroke. There is associated mild mass effect and effacement of the sulci. No midline shift. No acute intracranial hemorrhage or hemorrhagic conversion of the stroke. No extra-axial fluid collection."  Assessment:  His worsening symptoms are as expected in the setting of  his stroke likely with some element of delirium due to hospitalization and there is no indication for hypertonic saline given the minimal edema on scan at this time.  Cannot rule out new additional stroke on the right side, but again he would not be a candidate for any acute interventions  Given his very large territory stroke, if family is desiring continued highly aggressive care, discussed with Dr. Sheliah Plane that transfer to [facility] for closer monitoring and possible hypertonic saline.  Peak edema would be expected in 3 to 5 days. Therefore goals of care conversation should be clearly documented with assistance from palliative care team given his significant debility at baseline and the severe further impairment in quality of life aphasia results in.  Intervention such as hypertonic saline may still be insufficient to be lifesaving given he would not be a candidate for decompressive hemicraniectomy due to his age and comorbidities.  However it could potentially prevent brain death while leaving the patient with a very poor quality of life requiring total nursing care, very likely with tracheostomy and PEG tube, with continued inability to communicate or move his right side   Additionally recommend avoiding hypotonic solutions such as D10 and instead using D5 normal saline if needed for hypoglycemia, continuing permissive hypertension  Summary recommendations: -Palliative care consult and clear delineation of goals of care -Patient may stay at [facility] if goals are no escalation in care or comfort care -Patient may be transferred to [facility] if aggressive intervention such as hypertonic saline would be strongly desired by family despite the very  poor prognosis and quality of life this would result in -Supportive care should continue to include permissive hypertension, aspirin, avoidance of hypotonic solutions, normonatremia, normothermia, euglycemia until goals of care are  clarified  No charge phone recommendations Brooke Dare MD-PhD [facility] [phone] Available 7 PM to 7 AM, outside of these hours please call Neurologist on call as listed on Amion.

## 2022-02-09 ENCOUNTER — Other Ambulatory Visit: Payer: Self-pay

## 2022-02-09 ENCOUNTER — Inpatient Hospital Stay (HOSPITAL_COMMUNITY): Payer: Medicare Other

## 2022-02-09 DIAGNOSIS — I639 Cerebral infarction, unspecified: Secondary | ICD-10-CM | POA: Diagnosis not present

## 2022-02-09 DIAGNOSIS — H409 Unspecified glaucoma: Secondary | ICD-10-CM | POA: Diagnosis not present

## 2022-02-09 DIAGNOSIS — I10 Essential (primary) hypertension: Secondary | ICD-10-CM | POA: Diagnosis not present

## 2022-02-09 DIAGNOSIS — N400 Enlarged prostate without lower urinary tract symptoms: Secondary | ICD-10-CM | POA: Diagnosis not present

## 2022-02-09 LAB — GLUCOSE, CAPILLARY
Glucose-Capillary: 126 mg/dL — ABNORMAL HIGH (ref 70–99)
Glucose-Capillary: 123 mg/dL — ABNORMAL HIGH (ref 70–99)
Glucose-Capillary: 143 mg/dL — ABNORMAL HIGH (ref 70–99)
Glucose-Capillary: 107 mg/dL — ABNORMAL HIGH (ref 70–99)

## 2022-02-09 MED ORDER — PANTOPRAZOLE SODIUM 40 MG IV SOLR
40.0000 mg | INTRAVENOUS | Status: DC
  Administered 2022-02-09 – 2022-02-16 (×8): 40 mg via INTRAVENOUS
  Filled 2022-02-09 (×8): qty 10

## 2022-02-09 MED ORDER — ASPIRIN 300 MG RE SUPP
300.0000 mg | Freq: Every day | RECTAL | Status: DC
  Administered 2022-02-09 – 2022-02-14 (×6): 300 mg via RECTAL
  Filled 2022-02-09 (×6): qty 1

## 2022-02-09 MED ORDER — SODIUM CHLORIDE 0.9 % IV SOLN: INTRAVENOUS | Status: DC

## 2022-02-09 NOTE — Progress Notes (Signed)
   02/09/22 1100  Assess: MEWS Score  Temp 98.3 F (36.8 C)  BP (!) 168/79  MAP (mmHg) 105  Pulse Rate (!) 115  Resp (!) 32  Level of Consciousness Unresponsive  SpO2 100 %  O2 Device Room Air  Assess: MEWS Score  MEWS Temp 0  MEWS Systolic 0  MEWS Pulse 2  MEWS RR 2  MEWS LOC 3  MEWS Score 7  MEWS Score Color Red  Assess: if the MEWS score is Yellow or Red  Were vital signs taken at a resting state? Yes  Focused Assessment Change from prior assessment (see assessment flowsheet)  Does the patient meet 2 or more of the SIRS criteria? Yes  Does the patient have a confirmed or suspected source of infection? No  MEWS guidelines implemented *See Row Information* Yes  Treat  MEWS Interventions Escalated (See documentation below)  Pain Scale Faces  Faces Pain Scale 0  Take Vital Signs  Increase Vital Sign Frequency  Red: Q 1hr X 4 then Q 4hr X 4, if remains red, continue Q 4hrs  Escalate  MEWS: Escalate Red: discuss with charge nurse/RN and provider, consider discussing with RRT  Notify: Charge Nurse/RN  Name of Charge Nurse/RN Notified Chales Abrahams, RN  Date Charge Nurse/RN Notified 02/09/22  Time Charge Nurse/RN Notified 1100  Notify: Provider  Provider Name/Title Vassie Loll, MD  Date Provider Notified 02/09/22  Time Provider Notified 1116  Method of Notification Page  Notification Reason Change in status (change in LOC)  Provider response See new orders  Date of Provider Response 02/09/22  Time of Provider Response 1207  Document  Progress note created (see row info) Yes  Assess: SIRS CRITERIA  SIRS Temperature  0  SIRS Pulse 1  SIRS Respirations  1  SIRS WBC 1  SIRS Score Sum  3

## 2022-02-09 NOTE — Progress Notes (Signed)
Progress Note   Patient: Paul Bradshaw UMP:[id number] DOB: 1926/06/02 DOA: 02/07/2022     2 DOS: the patient was seen and examined on 02/09/2022   Brief hospital admission course: HAMEED KOLAR is a 88 y.o. male with medical history significant of hypertension, BPH, hyperlipidemia, history of carotid artery disease and chronic back pain with radiculopathy; who presented to the emergency department secondary to aphasia, inability to walk and right-sided weakness.  Patient was last seen normal prior to bed on 02/06/2022 (around 9:10 PM).    Symptoms remains present at time of evaluation; there has not been any chest pain, shortness of breath, fever, dysuria, hematuria, melena, hematochezia, abdominal pain, sick contacts or any other complaints.   Work-up in the ED demonstrated positive left acute MCA ischemic stroke with surrounding abnormalities and large vessel occlusion.  Case discussed with neurology service who after talking with patient's son and his age/comorbidities decision was made to treat was treatable and completed stroke work-up without aggressive/invasive intervention.  Overall prognosis is guarded.  Assessment and Plan: * Acute ischemic stroke [facility]) -Patient with acute left MCA stroke appreciated -Neurology recommended completed stroke work-up and pursue treatment with dual antiplatelet therapy for 90 days. -A1c 5.4 and lipid panel demonstrating stable cholesterol level with current statin dose. -Continue PT/OT and speech therapy evaluation and recommendations. -Has been found high risk for aspiration; and despite recommendation of dysphagia 1 with thin liquids on 02/08/2022, current level of mentation making possible to use oral route for medications, hydration and nutrition. -Tentative CIR referral in place. -Patient is DNR/DNI -Continue allowing permissive hypertension and provide supportive care. -Given ongoing neurologic decline repeat CT scan of the head has been  ordered. -Continue to follow clinical response.  Family interested to treat what is treatable without heroic measures or invasive management.  Glaucoma -Continue treatment with Alphagan and Xalatan.   Essential hypertension -Mostly borderline and no using antihypertensive agents as an outpatient -Allowing permissive hypertension in the setting of acute ischemic stroke. -Follow-up vital signs and determine the need for antihypertensive agents (looking for treatment on his blood pressure after first 48 hours for an ischemic event).  Pressure injury of skin -Stage I perineum/medial sacrum pressure injury appreciated at time of admission. -No signs of superimposed infection -Continue constant repositioning, preventive measures and local care.  Leukocytosis -No signs of acute infection appreciated -Most likely stress demargination -Repeat CBC in a.m. to follow WBCs trend/stability.   BPH (benign prostatic hyperplasia) -No complaints of urinary retention -Resume the use of Flomax and Proscar when able to take p.o.'s medication.   Subjective:  Afebrile; demonstrating worsening neurologic decline and positive tachypnea.  Mews score increased to 6-7 with current presentation.  High risk for aspiration.  Continue to have expressive aphasia, right facial droop, right hemiparesis and now also neurologic deficits on the left upper and lower extremities.  Physical Exam: Vitals:   02/09/22 0232 02/09/22 0610 02/09/22 1100 02/09/22 1215  BP: (!) 176/80 (!) 162/83 (!) 168/79 (!) 152/76  Pulse: (!) 108 (!) 111 (!) 115 (!) 111  Resp: 20 18 (!) 32 (!) 21  Temp: 97.8 F (36.6 C) 97.7 F (36.5 C) 98.3 F (36.8 C)   TempSrc: Oral Oral Oral   SpO2: 97% 100% 100% 97%  Weight:      Height:       General exam: Increased lethargy, less responsive, demonstrating respiratory distress and having further neurologic decline now affecting his left side as well.  Expressive aphasia and right hemiparesis  still seen.  Positive facial droop. Respiratory system: Tachypnea, good oxygen saturation on room air.  Positive rhonchi bilaterally. Cardiovascular system: Tachycardia, no rubs, no gallops, no JVD. Gastrointestinal system: Abdomen is nondistended, soft and nontender. No organomegaly or masses felt. Normal bowel sounds heard. Central nervous system: Unable to follow commands, left-sided weakness with upper extremity drift appreciated; right hemiparesis, right facial droop and expressive aphasia from before still appreciated. Extremities: No cyanosis or clubbing. Skin: No rashes, lesions or ulcers Psychiatry: Unable to properly assess secondary to ongoing neurologic deficits.  Data Reviewed: Patient less responsive today and also demonstrating respiratory distress and tachypnea. CT scan of the head without contrast to follow stroke/previous several edema seen and also chest x-ray has been ordered. Patient's son has once again expressed no invasive or heroic interventions.  Family Communication: Son at bedside.   Disposition: Status is: Inpatient Remains inpatient appropriate because: Completing stroke work-up and evaluation for safe discharge by physical therapy and speech therapy.  Continue to be high risk for aspiration and decompensation given underlying comorbidities/age and current presentation.   Planned Discharge Destination: To be determined; currently based on initial evaluation by physical therapy CIR has been suggested; Plan B will be skilled nursing facility for rehabilitation, conditioning and further care.   Author: Vassie Loll, MD 02/09/2022 12:17 PM  For on call review [email or web address].

## 2022-02-09 NOTE — Plan of Care (Signed)

## 2022-02-09 NOTE — Progress Notes (Addendum)
Pt restless and hanging feet off side of bed. Communication board used however, was unsuccessful. This RN and another RN assisted patient to side of bed. He stood at bedside for a short amount of time with 2 person assist. Pt attempting to verbalize need but incomprehensible speech present. Pt appears to get frustrated at times. He was assisted back to bed. Glasses placed on and tv turned to show he nodded in agreement to. Rt arm propped on pillow as it seems sensitive to him. Pt took small sip of thickened juice and coughed immediately after. No changes noted to breathing or color. Paul Bradshaw Gerrianne Scale

## 2022-02-09 NOTE — Progress Notes (Signed)
   02/09/22 0610  Vitals  Temp 97.7 F (36.5 C)  Temp Source Oral  BP (!) 162/83  MAP (mmHg) 107  BP Location Right Arm  BP Method Automatic  Patient Position (if appropriate) Sitting  Pulse Rate (!) 111  Pulse Rate Source Dinamap  Resp 18  Level of Consciousness  Level of Consciousness Alert  MEWS COLOR  MEWS Score Color Yellow  Oxygen Therapy  SpO2 100 %  O2 Device Room Air  MEWS Score  MEWS Temp 0  MEWS Systolic 0  MEWS Pulse 2  MEWS RR 0  MEWS LOC 0  MEWS Score 2  Provider Notification  Provider Name/Title ChargeTracy RN notified

## 2022-02-10 ENCOUNTER — Encounter (HOSPITAL_COMMUNITY): Payer: Self-pay | Admitting: Internal Medicine

## 2022-02-10 DIAGNOSIS — Z7189 Other specified counseling: Secondary | ICD-10-CM

## 2022-02-10 DIAGNOSIS — I639 Cerebral infarction, unspecified: Secondary | ICD-10-CM | POA: Diagnosis not present

## 2022-02-10 DIAGNOSIS — I10 Essential (primary) hypertension: Secondary | ICD-10-CM | POA: Diagnosis not present

## 2022-02-10 DIAGNOSIS — N400 Enlarged prostate without lower urinary tract symptoms: Secondary | ICD-10-CM | POA: Diagnosis not present

## 2022-02-10 DIAGNOSIS — Z515 Encounter for palliative care: Secondary | ICD-10-CM | POA: Diagnosis not present

## 2022-02-10 DIAGNOSIS — H409 Unspecified glaucoma: Secondary | ICD-10-CM | POA: Diagnosis not present

## 2022-02-10 LAB — CBC
HCT: 42.9 % (ref 39.0–52.0)
Hemoglobin: 13.6 g/dL (ref 13.0–17.0)
MCH: 31.9 pg (ref 26.0–34.0)
MCHC: 31.7 g/dL (ref 30.0–36.0)
MCV: 100.5 fL — ABNORMAL HIGH (ref 80.0–100.0)
Platelets: 205 10*3/uL (ref 150–400)
RBC: 4.27 MIL/uL (ref 4.22–5.81)
RDW: 11.9 % (ref 11.5–15.5)
WBC: 13.8 10*3/uL — ABNORMAL HIGH (ref 4.0–10.5)
nRBC: 0 % (ref 0.0–0.2)

## 2022-02-10 LAB — BASIC METABOLIC PANEL
Anion gap: 11 (ref 5–15)
BUN: 24 mg/dL — ABNORMAL HIGH (ref 8–23)
CO2: 22 mmol/L (ref 22–32)
Calcium: 8.9 mg/dL (ref 8.9–10.3)
Chloride: 105 mmol/L (ref 98–111)
Creatinine, Ser: 0.68 mg/dL (ref 0.61–1.24)
GFR, Estimated: >60 mL/min (ref >60)
Glucose, Bld: 106 mg/dL — ABNORMAL HIGH (ref 70–99)
Potassium: 3.6 mmol/L (ref 3.5–5.1)
Sodium: 138 mmol/L (ref 135–145)

## 2022-02-10 LAB — GLUCOSE, CAPILLARY
Glucose-Capillary: 103 mg/dL — ABNORMAL HIGH (ref 70–99)
Glucose-Capillary: 106 mg/dL — ABNORMAL HIGH (ref 70–99)
Glucose-Capillary: 107 mg/dL — ABNORMAL HIGH (ref 70–99)
Glucose-Capillary: 112 mg/dL — ABNORMAL HIGH (ref 70–99)
Glucose-Capillary: 113 mg/dL — ABNORMAL HIGH (ref 70–99)
Glucose-Capillary: 120 mg/dL — ABNORMAL HIGH (ref 70–99)
Glucose-Capillary: 146 mg/dL — ABNORMAL HIGH (ref 70–99)

## 2022-02-10 MED ORDER — KETOROLAC TROMETHAMINE 15 MG/ML IJ SOLN
15.0000 mg | Freq: Once | INTRAMUSCULAR | Status: AC
  Administered 2022-02-10: 15 mg via INTRAVENOUS
  Filled 2022-02-10: qty 1

## 2022-02-10 MED ORDER — ORAL CARE MOUTH RINSE: 15.0000 mL | OROMUCOSAL | Status: DC | PRN

## 2022-02-10 MED ORDER — ORAL CARE MOUTH RINSE
15.0000 mL | OROMUCOSAL | Status: DC
  Administered 2022-02-11 – 2022-02-17 (×24): 15 mL via OROMUCOSAL

## 2022-02-10 NOTE — Progress Notes (Signed)
Pt is lethargic this morning and is unable to follow commands. Pt has been bathed and repositioned to right side. Call light is within reach.

## 2022-02-10 NOTE — Progress Notes (Signed)
Dr. Carren Rang, on-call for attending, notified of pt's fever (102.8) per protocol. Orders received for blood cultures and IV Toradol  to be given in addition to PR APAP. Will implement orders and continue to trend pt's temps.

## 2022-02-10 NOTE — Consult Note (Signed)
Consultation Note Date: 02/10/2022   Patient Name: Paul Bradshaw  DOB: 08/10/1925  MRN: [id number]  Age / Sex: 88 y.o., male  PCP: Sonny Masters, FNP Referring Physician: Vassie Loll, MD  Reason for Consultation: Establishing goals of care and Hospice Evaluation  HPI/Patient Profile: 88 y.o. male  with past medical history of HTN/HLD, history of carotid artery disease, BPH, chronic back pain with radiculopathy recently treated, history of bacteremia in 2014, history of stroke in 2006, mild aortic regurgitation, arthritis, back surgery 2015, admitted on 02/07/2022 with acute ischemic stroke.   Clinical Assessment and Goals of Care: I have reviewed medical records including EPIC notes, labs and imaging, received report from RN, assessed the patient.  Paul Bradshaw is lying quietly in bed.  He appears acutely ill.  His eyes are open, and although he will briefly make eye contact, he does not respond to me verbally or in any way when I ask questions.  He clearly cannot make his basic needs known.  As I first enter, there is no family present, but within several minutes, son Paul Bradshaw and his significant other, Paul Bradshaw, arrives.  We meet at the bedside to discuss diagnosis prognosis, GOC, EOL wishes, disposition and options.  I introduced Palliative Medicine as specialized medical care for people living with serious illness. It focuses on providing relief from the symptoms and stress of a serious illness. The goal is to improve quality of life for both the patient and the family.  We discussed a brief life review of the patient.  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  His first wife died around 35 years ago.  He has been married to his current wife, Paul Bradshaw, for approximately 30 years.  He has 1 child, son Paul Bradshaw.  Paul Bradshaw has been living independently with his wife, managing ADLs/IADLs.  Family shares that  he was cutting grass approximately 6 weeks ago.  Paul Bradshaw shares that Paul Bradshaw has memory loss, and although Jerrald was a caregiver, now that he is hospitalized Paul Bradshaw's daughters are caring for her.  Both Paul Bradshaw and Paul Bradshaw share their concern about Paul Bradshaw's care, they share that have made Adult Protective Services report.  We then focused on their current illness.  We talk in detail about left MCA ischemic stroke, hemiaplasia, hydration and nutrition.  We talk about recent chest x-ray and brain scans.  It seems that initially [name] had been considered for CIR, but he has worsened over the last few days.  He is unable to safely take food and liquid.  Paul Bradshaw states that his father would not want to be kept alive artificially, and therefore would not accept PEG tube placement.  Multiple times during our conversation Paul Bradshaw is tearful when talking about the changes that happen for his father.  The natural disease trajectory and expectations at EOL were discussed.  I attempted to elicit values and goals of care important to the patient.  Paul Bradshaw states that [name] would not want to live in a nursing home.  The difference between aggressive medical intervention and comfort care was considered in light of the patient's goals of care.  Paul Bradshaw is DNR, but we talk about the concept of "let nature take its course".  Advanced directives, concepts specific to code status, artifical feeding and hydration, and rehospitalization were considered and discussed.  Paul Bradshaw endorses DNR.  Hospice and Palliative Care services outpatient were explained and offered.  We talk about the differences between palliative and hospice care.  I shared that at this point, [name] would easily qualify for residential hospice.  Provider of choice would be [facility] [place].  At this point, [name] states that he would like overnight to consider how best to care for his father.  Discussed the importance of continued conversation  with family and the medical providers regarding overall plan of care and treatment options, ensuring decisions are within the context of the patient's values and GOCs.  Questions and concerns were addressed.   The family was encouraged to call with questions or concerns.  PMT will continue to support holistically.  Conference with attending, bedside nursing staff, transition of care team related to patient condition, needs, goals of care, disposition.   HCPOA  NEXT OF KIN -only child, son Paul Bradshaw shares that [name]'s wife of 30 years, Paul Bradshaw, has memory loss.  Paul Bradshaw would be healthcare surrogate.  SUMMARY OF RECOMMENDATIONS   Considering residential hospice at [facility] house Request overnight to consider choices   Code Status/Advance Care Planning: DNR  Symptom Management:  Per hospitalist, no additional needs at this time.  Palliative Prophylaxis:  Frequent Pain Assessment, Oral Care, and Turn Reposition  Additional Recommendations (Limitations, Scope, Preferences): No CPR or intubation, no artificial hydration/nutrition  Psycho-social/Spiritual:  Desire for further Chaplaincy support:no Additional Recommendations: Caregiving  Support/Resources and Education on Hospice  Prognosis:  < 2 weeks, would not be surprising based on [name]'s acute stroke and inability to safely take food and drink by mouth.  Discharge Planning: To be determined, family is leaning toward residential hospice, [facility]      Primary Diagnoses: Present on Admission:  Acute ischemic stroke [facility])  BPH (benign prostatic hyperplasia)  Essential hypertension  Glaucoma   I have reviewed the medical record, interviewed the patient and family, and examined the patient. The following aspects are pertinent.  Past Medical History:  Diagnosis Date   Aortic regurgitation    Moderate   Arthritis    BPH (benign prostatic hyperplasia)    Cervical disc disease    Cervical radiculopathy    Hyperlipidemia     Hypertension    Prediabetes 02/16/2021   Staphylococcus aureus bacteremia 08/09/2012   TEE negative for vegetation or thrombus February 2014   Stroke [facility]) 2005 or 2006   3   Symptomatic carotid artery stenosis with infarction [facility]) 2005 or 2006   Status post right carotid endarterectomy   Social History   Socioeconomic History   Marital status: Married    Spouse name: Not on file   Number of children: Not on file   Years of education: Not on file   Highest education level: Not on file  Occupational History   Not on file  Tobacco Use   Smoking status: Never   Smokeless tobacco: Never  Vaping Use   Vaping Use: Never used  Substance and Sexual Activity   Alcohol use: No   Drug use: No   Sexual activity: Not Currently  Other Topics Concern   Not on file  Social History Narrative   Not on file   Social Determinants of Health   Financial Resource Strain: Not on file  Food Insecurity: Not on file  Transportation Needs: Not on file  Physical Activity: Not on file  Stress: Not on file  Social Connections: Not on file   Family History  Problem Relation Age of Onset   Emphysema Father    Alzheimer's disease Mother    Heart disease Brother    Heart attack Brother    COPD Son    Scheduled Meds:  aspirin  300 mg Rectal Daily   atorvastatin  10 mg Oral q1800   brimonidine  1 drop Both Eyes TID   clopidogrel  75 mg Oral Daily   heparin  5,000 Units Subcutaneous Q8H   latanoprost  1 drop Both Eyes QPM   pantoprazole (PROTONIX) IV  40 mg Intravenous Q24H   Continuous Infusions:  sodium chloride 50 mL/hr at 02/09/22 1853   PRN Meds:.[DISCONTINUED] acetaminophen **OR** acetaminophen (TYLENOL) oral liquid 160 mg/5 mL **OR** acetaminophen, senna-docusate Medications Prior to Admission:  Prior to Admission medications   Medication Sig Start Date End Date Taking? Authorizing Provider  albuterol (VENTOLIN HFA) 108 (90 Base) MCG/ACT inhaler Inhale 2 puffs into the lungs  every 6 (six) hours as needed for shortness of breath or wheezing. 08/20/21  Yes Deliah Boston F, FNP  atorvastatin (LIPITOR) 10 MG tablet TAKE ONE-HALF TABLET BY  MOUTH DAILY AT 6 PM 09/12/21  Yes Gwenlyn Fudge, FNP  brimonidine (ALPHAGAN) 0.2 % ophthalmic solution Place 1 drop into both eyes 3 (three) times daily. 10/08/21  Yes [provider]  cetirizine (ZYRTEC) 10 MG tablet Take 10 mg by mouth daily.   Yes [provider]  clopidogrel (PLAVIX) 75 MG tablet TAKE 1 TABLET BY MOUTH  DAILY WITH BREAKFAST 09/12/21  Yes Deliah Boston F, FNP  dorzolamide-timolol (COSOPT) 22.3-6.8 MG/ML ophthalmic solution Place 1 drop into both eyes 2 (two) times daily. 10/08/21  Yes [provider]  finasteride (PROSCAR) 5 MG tablet Take 1 tablet (5 mg total) by mouth daily. 08/19/21  Yes Jerilee Field, MD  HYDROcodone-acetaminophen (NORCO) 7.5-325 MG tablet Take 1 tablet by mouth every 6 (six) hours as needed for moderate pain. 01/14/22  Yes Gwenlyn Fudge, FNP  latanoprost (XALATAN) 0.005 % ophthalmic solution Place 1 drop into both eyes every evening. 01/31/22  Yes [provider]  Multiple Vitamins-Minerals (MULTIVITAMIN PO) Take 1 tablet by mouth daily.   Yes [provider]  tamsulosin (FLOMAX) 0.4 MG CAPS capsule TAKE 1 CAPSULE BY MOUTH IN  THE EVENING 09/12/21  Yes Deliah Boston F, FNP  Turmeric 1053 MG TABS Take 1 tablet by mouth daily.   Yes [provider]  acetaminophen (TYLENOL) 500 MG tablet Take 1,000 mg by mouth every 8 (eight) hours as needed for moderate pain.    [provider]  HYDROcodone-acetaminophen (NORCO) 7.5-325 MG tablet Take 1 tablet by mouth every 6 (six) hours as needed for moderate pain. Patient not taking: Reported on 02/07/2022 02/13/22   Gwenlyn Fudge, FNP  HYDROcodone-acetaminophen (NORCO) 7.5-325 MG tablet Take 1 tablet by mouth every 6 (six) hours as needed for moderate pain. Patient not taking: Reported on  02/07/2022 03/15/22   Gwenlyn Fudge, FNP   Allergies  Allergen Reactions   Amlodipine Swelling    Swelling of lips.   Lisinopril Other (See Comments)    Elevated BP higher & causes a burning feeling on the inside.  Review of Systems  Unable to perform ROS: Acuity of condition    Physical Exam Vitals reviewed.  Constitutional:      General: He is not in acute distress.    Appearance: He is ill-appearing.  HENT:     Mouth/Throat:     Mouth: Mucous membranes are dry.  Cardiovascular:     Rate and Rhythm: Normal rate.  Pulmonary:     Effort: Pulmonary effort is normal. No respiratory distress.  Musculoskeletal:        General: No swelling.  Skin:    General: Skin is warm and dry.  Neurological:     Comments: Does not respond verbally      Vital Signs: BP (!) 176/79 (BP Location: Left Arm)   Pulse 99   Temp 98.8 F (37.1 C) (Oral)   Resp 18   Ht 5\' 5"  (1.651 m)   Wt 67.9 kg   SpO2 97%   BMI 24.91 kg/m  Pain Scale: Faces   Pain Score: Asleep   SpO2: SpO2: 97 % O2 Device:SpO2: 97 % O2 Flow Rate: .   IO: Intake/output summary:  Intake/Output Summary (Last 24 hours) at 02/10/2022 1331 Last data filed at 02/10/2022 0300 Gross per 24 hour  Intake 405.83 ml  Output --  Net 405.83 ml    LBM: Last BM Date :  (PTA) Baseline Weight: Weight: 67.9 kg Most recent weight: Weight: 67.9 kg     Palliative Assessment/Data:   Flowsheet Rows    Flowsheet Row Most Recent Value  Intake Tab   Referral Department Hospitalist  Unit at Time of Referral Cardiac/Telemetry Unit  Palliative Care Primary Diagnosis Neurology  Date Notified 02/08/22  Palliative Care Type New Palliative care  Reason for referral Clarify Goals of Care  Date of Admission 02/07/22  Date first seen by Palliative Care 02/10/22  # of days Palliative referral response time 2 Day(s)  # of days IP prior to Palliative referral 1  Clinical Assessment   Palliative Performance Scale Score 20%  Pain  Max last 24 hours Not able to report  Pain Min Last 24 hours Not able to report  Dyspnea Max Last 24 Hours Not able to report  Dyspnea Min Last 24 hours Not able to report  Psychosocial & Spiritual Assessment   Palliative Care Outcomes        Time In: 1000 Time Out: 1115 Time Total: 75 minutes  Greater than 50%  of this time was spent counseling and coordinating care related to the above assessment and plan.  Signed by: 02/12/22, NP   Please contact Palliative Medicine Team phone at [phone] for questions and concerns.  For individual provider: See [phone]

## 2022-02-10 NOTE — Progress Notes (Signed)
Pt has been asleep most of shift. Pt cannot communicate needs with staff or respond to questions. Oral medication has been held due to pt unable to swallow. Pt was able to respond to family members by mumbling incoherently this evening.

## 2022-02-10 NOTE — Progress Notes (Addendum)
  Inpatient Rehabilitation Admissions Coordinator   Rehab consult received. Noted less responsive on 8/20. I await further assessments before contacting family to pursue rehab venue discussions. I will follow up when appropriate.Currently not at a level to pursue CIR level intensive rehab.  Ottie Glazier, RN, MSN Rehab Admissions Coordinator [phone] 02/10/2022 9:41 AM

## 2022-02-10 NOTE — Progress Notes (Signed)
Speech Language Pathology Treatment: Dysphagia  Patient Details Name: JOUSHUA DUGAR MRN: [id number] DOB: Jan 06, 1926 Today's Date: 02/10/2022 Time: 3810-1751 SLP Time Calculation (min) (ACUTE ONLY): 24 min  Assessment / Plan / Recommendation Clinical Impression  Pt seen for ongoing dysphagia intervention following clinical swallow evaluation completed on Saturday. Pt with reduced alertness today and only minimally responsive per nursing staff. They have not been able to give him PO medications or foods/liquids. Pt roused to voice and tactile stimulation by opening his eyes and tracking. He allowed oral care and readily opened his mouth for a small ice chip. He manipulated the ice chip and seemingly executed swallows (multiple swallows for each presentation)  and minimal delayed throat clearing. Pt unable to sustain adequate alertness or attention to task for anything beyond single ice chips. Recommend continue NPO with oral care and OK for single small ice chips (RN may need to run the ice chip under the water to get to an appropriate size- pea sized ideally) when Pt is alert and willing for comfort and to encourage ongoing use of swallowing musculature. SLP spoke with son outside of the room and provided education regarding plan for oral care and single ice chips per RN.    HPI HPI: DUSHAWN PUSEY is a 88 y.o. male with medical history significant of hypertension, BPH, hyperlipidemia, history of carotid artery disease and chronic back pain with radiculopathy; who presented to the emergency department secondary to aphasia, inability to walk and right-sided weakness.  Patient was last seen normal prior to bed on 02/06/2022 (around 9:10 PM). noted to have MCA infarct and several occlusions on LVO. BSE and SLE ordred.      SLP Plan  Continue with current plan of care      Recommendations for follow up therapy are one component of a multi-disciplinary discharge planning process, led by the attending  physician.  Recommendations may be updated based on patient status, additional functional criteria and insurance authorization.    Recommendations  Diet recommendations: NPO (NPO except for single ice chips after oral care if alert) Medication Administration: Via alternative means                Oral Care Recommendations: Oral care prior to ice chip/H20;Staff/trained caregiver to provide oral care Follow Up Recommendations: Follow physician's recommendations for discharge plan and follow up therapies Assistance recommended at discharge: Frequent or constant Supervision/Assistance SLP Visit Diagnosis: Dysphagia, unspecified (R13.10) Plan: Continue with current plan of care         Thank you,  Havery Moros, CCC-SLP [phone]    Maesyn Frisinger  02/10/2022, 2:27 PM

## 2022-02-10 NOTE — Progress Notes (Signed)
Progress Note   Patient: Paul Bradshaw JKK:[id number] DOB: March 09, 1926 DOA: 02/07/2022     3 DOS: the patient was seen and examined on 02/10/2022   Brief hospital admission course: Paul Bradshaw is a 88 y.o. male with medical history significant of hypertension, BPH, hyperlipidemia, history of carotid artery disease and chronic back pain with radiculopathy; who presented to the emergency department secondary to aphasia, inability to walk and right-sided weakness.  Patient was last seen normal prior to bed on 02/06/2022 (around 9:10 PM).    Symptoms remains present at time of evaluation; there has not been any chest pain, shortness of breath, fever, dysuria, hematuria, melena, hematochezia, abdominal pain, sick contacts or any other complaints.   Work-up in the ED demonstrated positive left acute MCA ischemic stroke with surrounding abnormalities and large vessel occlusion.  Case discussed with neurology service who after talking with patient's son and his age/comorbidities decision was made to treat was treatable and completed stroke work-up without aggressive/invasive intervention.  Overall prognosis is guarded.  Assessment and Plan: * Acute ischemic stroke [facility]) -Patient with acute left MCA stroke appreciated -Neurology recommended completed stroke work-up and pursue treatment with dual antiplatelet therapy for 90 days. -A1c 5.4 and lipid panel demonstrating stable cholesterol level with current statin dose. -Continue PT/OT and speech therapy evaluation and follow recommendations. -Has been found high risk for aspiration; and despite recommendation of dysphagia 1 with thin liquids on 02/08/2022, current level of mentation making impossible to use oral route for medications, hydration and/or nutrition. -Family has once again reiterated no invasive treatment no heroic measures. -Palliative care has been consulted and the plan is for 24 hours evaluation and if no significant improvement will  transition to comfort care on hospice. -Patient is DNR/DNI  Glaucoma -Continue treatment with Alphagan and Xalatan.   Essential hypertension -Mostly borderline and no using antihypertensive agents as an outpatient -Allowing permissive hypertension in the setting of acute ischemic stroke. -Follow-up vital signs and determine the need for antihypertensive agents (looking for treatment on his blood pressure after first 48 hours for an ischemic event).  Pressure injury of skin -Stage I perineum/medial sacrum pressure injury appreciated at time of admission. -No signs of superimposed infection -Continue constant repositioning, preventive measures and local care.  Leukocytosis -No signs of acute infection appreciated -Most likely stress demargination -Repeat CBC in a.m. to follow WBCs trend/stability.   BPH (benign prostatic hyperplasia) -No complaints of urinary retention -Resume the use of Flomax and Proscar when able to take p.o.'s medication.   Subjective:  No fever, no nausea vomiting reported.  Remains somnolent/lethargic and currently unable to follow commands.  Right hemiparesis, right facial droop and expressive aphasia as main dominant neurologic deficits.  No eating or drinking.  Physical Exam: Vitals:   02/09/22 1939 02/09/22 2251 02/10/22 0509 02/10/22 1408  BP: (!) 141/68 (!) 167/80 (!) 176/79 (!) 172/72  Pulse: 99 (!) 102 99 90  Resp: (!) 23 (!) 24 18 18   Temp: 98.8 F (37.1 C) 98.4 F (36.9 C) 98.8 F (37.1 C) 98.6 F (37 C)  TempSrc:   Oral Oral  SpO2: 97% 98% 97% 98%  Weight:      Height:       General exam: Somnolent/lethargic, not following commands, no eating or drinking spontaneously and is still demonstrating expressive aphasia, right facial droop and right hemiparesis. Respiratory system: Clear to auscultation. Respiratory effort normal.  Good saturation on room air.  No tachypnea. Cardiovascular system:RRR.  No rubs, no gallops,  no  JVD. Gastrointestinal system: Abdomen is nondistended, soft and nontender. No organomegaly or masses felt. Normal bowel sounds heard. Central nervous system: No new deficits seen.  Very neurologically compromised with expressive aphasia, right facial droop and right hemiparesis.  Patient is not following commands. Extremities: No cyanosis or clubbing. Skin: No petechiae. Psychiatry: Unable to properly assessed with current mentation.  Data Reviewed: Patient less responsive today and also demonstrating respiratory distress and tachypnea. CT scan of the head without contrast to follow stroke/previous several edema seen and also chest x-ray ordered on 02/09/22: No cardiopulmonary process appreciated.  Further evaluation of left MCA stroke appreciated with some demonstration of edema but no midline shift.  No new intracranial abnormalities or hemorrhagic conversion appreciated.. Patient's son has once again expressed no invasive or heroic interventions desired. Palliative care consulted.  Family Communication: Son at bedside.   Disposition: Status is: Inpatient Remains inpatient appropriate because: Stroke work-up has been completed; patient with significant neurologic deficit, ongoing expressive aphasia, right facial droop and right hemiparesis.  Somnolent/lethargic and unable to maintain nutrition or hydration on his own at this point.   Planned Discharge Destination: To be determined; currently based on active decline and inability to secure hydration/nutrition on his own patient most likely will just qualify for residential hospice.  Family not interested on invasive/heroic intervention.   Author: Vassie Loll, MD 02/10/2022 5:59 PM  For on call review [email or web address].

## 2022-02-11 DIAGNOSIS — Z7189 Other specified counseling: Secondary | ICD-10-CM | POA: Diagnosis not present

## 2022-02-11 DIAGNOSIS — N400 Enlarged prostate without lower urinary tract symptoms: Secondary | ICD-10-CM | POA: Diagnosis not present

## 2022-02-11 DIAGNOSIS — I639 Cerebral infarction, unspecified: Secondary | ICD-10-CM | POA: Diagnosis not present

## 2022-02-11 DIAGNOSIS — Z515 Encounter for palliative care: Secondary | ICD-10-CM | POA: Diagnosis not present

## 2022-02-11 DIAGNOSIS — I10 Essential (primary) hypertension: Secondary | ICD-10-CM | POA: Diagnosis not present

## 2022-02-11 DIAGNOSIS — R1310 Dysphagia, unspecified: Secondary | ICD-10-CM | POA: Diagnosis not present

## 2022-02-11 DIAGNOSIS — H409 Unspecified glaucoma: Secondary | ICD-10-CM | POA: Diagnosis not present

## 2022-02-11 LAB — GLUCOSE, CAPILLARY
Glucose-Capillary: 106 mg/dL — ABNORMAL HIGH (ref 70–99)
Glucose-Capillary: 112 mg/dL — ABNORMAL HIGH (ref 70–99)
Glucose-Capillary: 112 mg/dL — ABNORMAL HIGH (ref 70–99)
Glucose-Capillary: 113 mg/dL — ABNORMAL HIGH (ref 70–99)
Glucose-Capillary: 116 mg/dL — ABNORMAL HIGH (ref 70–99)
Glucose-Capillary: 118 mg/dL — ABNORMAL HIGH (ref 70–99)

## 2022-02-11 NOTE — Evaluation (Signed)
Speech Language Pathology Evaluation Patient Details Name: Paul Bradshaw MRN: [id number] DOB: Jul 28, 1925 Today's Date: 02/11/2022 Time: 7619-5093 SLP Time Calculation (min) (ACUTE ONLY): 22 min  Problem List:  Patient Active Problem List   Diagnosis Date Noted   Acute ischemic stroke (HCC) 02/07/2022   Pressure injury of skin 02/07/2022   Essential hypertension 02/07/2022   Glaucoma 02/07/2022   Chronic midline low back pain with bilateral sciatica 11/19/2021   Controlled substance agreement signed 11/19/2021   Aortic atherosclerosis (HCC) 08/20/2021   Nephrolithiasis 05/06/2021   Leukocytosis 05/06/2021   History of prediabetes 02/16/2021   White coat syndrome without hypertension    Risk for falls 05/27/2019   History of CVA (cerebrovascular accident) 10/23/2016   HNP (herniated nucleus pulposus), lumbar 01/23/2014   Lumbar stenosis 01/23/2014   BPH (benign prostatic hyperplasia) 10/14/2013   Mixed hyperlipidemia 10/14/2013   Carotid artery disease (HCC) 09/22/2011   Past Medical History:  Past Medical History:  Diagnosis Date   Aortic regurgitation    Moderate   Arthritis    BPH (benign prostatic hyperplasia)    Cervical disc disease    Cervical radiculopathy    Hyperlipidemia    Hypertension    Prediabetes 02/16/2021   Staphylococcus aureus bacteremia 08/09/2012   TEE negative for vegetation or thrombus February 2014   Stroke [facility]) 2005 or 2006   3   Symptomatic carotid artery stenosis with infarction (HCC) 2005 or 2006   Status post right carotid endarterectomy   Past Surgical History:  Past Surgical History:  Procedure Laterality Date   BACK SURGERY     CAROTID ENDARTERECTOMY     CATARACT EXTRACTION W/PHACO Left 02/15/2015   Procedure: CATARACT EXTRACTION PHACO AND INTRAOCULAR LENS PLACEMENT LEFT EYE CDE=30.93;  Surgeon: Gemma Payor, MD;  Location: [facility] ORS;  Service: Ophthalmology;  Laterality: Left;   CHOLECYSTECTOMY     EYE SURGERY     KIDNEY STONE  SURGERY     LUMBAR LAMINECTOMY/DECOMPRESSION MICRODISCECTOMY Bilateral 01/23/2014   Procedure: LUMBAR LAMINECTOMY/DECOMPRESSION MICRODISCECTOMY 1 LEVEL L5-S1;  Surgeon: Temple Pacini, MD;  Location: [facility] NEURO ORS;  Service: Neurosurgery;  Laterality: Bilateral;  LUMBAR LAMINECTOMY/DECOMPRESSION MICRODISCECTOMY 1 LEVEL L5-S1   TEE WITHOUT CARDIOVERSION N/A 08/12/2012   Procedure: TRANSESOPHAGEAL ECHOCARDIOGRAM (TEE);  Surgeon: Wendall Stade, MD;  Location: [facility] ENDO SUITE;  Service: Cardiovascular;  Laterality: N/A;   TEE WITHOUT CARDIOVERSION N/A 06/24/2013   Procedure: TRANSESOPHAGEAL ECHOCARDIOGRAM (TEE);  Surgeon: Jonelle Sidle, MD;  Location: [facility] ENDO SUITE;  Service: Endoscopy;  Laterality: N/A;   HPI:  Paul Bradshaw is a 88 y.o. male with medical history significant of hypertension, BPH, hyperlipidemia, history of carotid artery disease and chronic back pain with radiculopathy; who presented to the emergency department secondary to aphasia, inability to walk and right-sided weakness.  Patient was last seen normal prior to bed on 02/06/2022 (around 9:10 PM). noted to have MCA infarct and several occlusions on LVO. BSE and SLE ordred.   Assessment / Plan / Recommendation Clinical Impression  Pt presents with SEVERE expressive aphasia and moderate receptive aphasia. Cognition and Receptive communication are difficult to assess secondary to severe expressive impairment. Pt said no words and produced very little voicing; with MAX cues and melodic intonation support Pt did generate 2 groans/grunting sounds on command with a model. Pt demonstrated voicing with crying and laughing when shown pictures of babies and when music of preference is played. SLP sang familiar songs and Pt was unable to sing but did produce voicing  with crying during music. Pt answered y/n questions by shaking head no and nodding yes when asked ~10 biographical questions with 100% accuracy. Pt was unable, however to follow one step  commands despite trying (Point to your nose - Pt points to his cheek and then points to SLP). SLP reviewed with RN and family that y/n is an area of strength and best way to currently communicate with Pt. Encouraged family to play music and engage with Pt and provided support. ST will continue to follow    SLP Assessment  SLP Visit Diagnosis: Aphasia (R47.01)    Recommendations for follow up therapy are one component of a multi-disciplinary discharge planning process, led by the attending physician.  Recommendations may be updated based on patient status, additional functional criteria and insurance authorization.    Follow Up Recommendations  Follow physician's recommendations for discharge plan and follow up therapies    Assistance Recommended at Discharge  Frequent or constant Supervision/Assistance  Functional Status Assessment    Frequency and Duration min 2x/week  1 week      SLP Evaluation Cognition  Overall Cognitive Status: Difficult to assess (Severe expressive aphasia) Arousal/Alertness: Awake/alert       Comprehension  Auditory Comprehension Overall Auditory Comprehension: Impaired Yes/No Questions: Within Functional Limits Commands: Impaired Visual Recognition/Discrimination Discrimination: Not tested Reading Comprehension Reading Status: Unable to assess (comment)    Expression Expression Primary Mode of Expression: Nonverbal - gestures Verbal Expression Overall Verbal Expression: Impaired Initiation: Impaired Automatic Speech:  (UNABLE) Level of Generative/Spontaneous Verbalization:  (grunting) Repetition: Impaired Level of Impairment: Word level Naming: Impairment Responsive: 0-25% accurate Confrontation: Impaired Convergent: 0-24% accurate Divergent: 0-24% accurate Non-Verbal Means of Communication: Gestures Written Expression Dominant Hand: Right   Oral / Motor  Oral Motor/Sensory Function Overall Oral Motor/Sensory Function: Severe  impairment Facial ROM: Reduced right Facial Symmetry: Abnormal symmetry right Facial Strength: Reduced right Facial Sensation: Reduced right Lingual ROM: Reduced right Motor Speech Overall Motor Speech: Impaired Motor Planning: Impaired Level of Impairment: Word Motor Speech Errors: Groping for words            Katalia Choma H. Romie Levee, CCC-SLP Speech Language Pathologist  Georgetta Haber 02/11/2022, 10:11 AM

## 2022-02-11 NOTE — Plan of Care (Signed)
  Problem: Education: Goal: Knowledge of disease or condition will improve Outcome: Not Progressing Goal: Knowledge of secondary prevention will improve (SELECT ALL) Outcome: Not Progressing   Problem: Ischemic Stroke/TIA Tissue Perfusion: Goal: Complications of ischemic stroke/TIA will be minimized Outcome: Not Progressing

## 2022-02-11 NOTE — Progress Notes (Signed)
Palliative: [name], Paul Bradshaw, is resting quietly in bed.  He appears acutely ill, but more alert than yesterday.  He will make an somewhat keep eye contact.  I do not believe that he can make his basic needs known.  His only child, son Paul Bradshaw, and Paul Bradshaw's long-term girlfriend Paul Bradshaw are present at bedside.  We talk about Paul Bradshaw's acute health concerns.  Family shares that speech therapy saw him this morning and is planning for a modified barium swallow this afternoon.  I shared that this will help direct what is next.  I shared that Paul Bradshaw had a fever overnight and blood cultures were collected.  His son shares that he had bacteremia several years ago and "could not move his pinky, but walked out" of the hospital.  I share that overall, Paul Bradshaw is still struggling with hemiplegia.  We talk about time for outcomes.  Conference with attending, bedside nursing staff, transition of care team related to patient condition, needs, goals of care, disposition.  Plan: At this point continue to treat the treatable but no CPR or intubation.  Time for outcomes.  MBBS pending.  50 minutes Paul Carmel, NP Palliative medicine team Team phone [phone] Greater than 50% of this time was spent counseling and coordinating care related to the above assessment and plan.

## 2022-02-11 NOTE — Progress Notes (Signed)
Physical Therapy Treatment Patient Details Name: Paul Bradshaw MRN: [id number] DOB: 1925/10/22 Today's Date: 02/11/2022   History of Present Illness Paul Bradshaw is a 88 y.o. male with medical history significant of hypertension, BPH, hyperlipidemia, history of carotid artery disease and chronic back pain with radiculopathy; who presented to the emergency department secondary to aphasia, inability to walk and right-sided weakness.  Patient was last seen normal prior to bed on 02/06/2022 (around 9:10 PM).    PT Comments    Pt's son and other family member present for session. Reports much improved alertness today.  Pt with improved communication today, able to nod head appropriately to questions and willingness to participate with therapy today.  Pt required min to mod assist with all mobility and transfer to chair today.  Pt continues to avoid Rt side and is unable to mobilize Rt UE. Pt completed transfer to edge of bed from supine using Lt UE and bedrail and able to establish and maintain seated balance on edge of bed.  Pt assisted to stand using Lt UE and able to take 3 side steps with cues over to chair.  Pt very pleased with progress today as indicated with facial expression.  LE therex completed as well with pt able to follow all commands appropriately.  Nursing made aware of pt being up with family.     Recommendations for follow up therapy are one component of a multi-disciplinary discharge planning process, led by the attending physician.  Recommendations may be updated based on patient status, additional functional criteria and insurance authorization.  Follow Up Recommendations  Acute inpatient rehab (3hours/day)     Assistance Recommended at Discharge Frequent or constant Supervision/Assistance  Patient can return home with the following A lot of help with walking and/or transfers;A lot of help with bathing/dressing/bathroom   Equipment Recommendations  None recommended by PT     Recommendations for Other Services OT consult;Speech consult     Precautions / Restrictions Precautions Precautions: Fall Restrictions Weight Bearing Restrictions: No     Mobility  Bed Mobility Overal bed mobility: Needs Assistance Bed Mobility: Supine to Sit, Sit to Supine     Supine to sit: HOB elevated, Min assist, Mod assist     General bed mobility comments: Pt able to complete supine to sit using bedrails and increased time.  pt does not use his Rt UE    Transfers Overall transfer level: Needs assistance Equipment used: None Transfers: Sit to/from Stand Sit to Stand: Mod assist, Min assist           General transfer comment: No AD used due to flaccid R UE.  Bed to chair transfer with min-modad assist.  This therapist assisted anteriorly. Pt able to take small side steps over to chair with cues    Ambulation/Gait Ambulation/Gait assistance: Min assist, Mod assist Gait Distance (Feet): 3 Feet Assistive device: None         General Gait Details: side stepped over to chair from bed with cues from therapist          Cognition Arousal/Alertness: Awake/alert Behavior During Therapy: [facility] for tasks assessed/performed, Flat affect Overall Cognitive Status: Impaired/Different from baseline                                 General Comments: Pt following all commands this morning, No verbalization but able to shake his head appropriately to questions.  Exercises General Exercises - Lower Extremity Ankle Circles/Pumps: AROM, Both, 5 reps, Seated Long Arc Quad: AROM, Both, 15 reps, Seated Hip Flexion/Marching: AROM, Both, 15 reps, Seated     Home Living Family/patient expects to be discharged to:: Private residence Living Arrangements: Spouse/significant other Available Help at Discharge: Family;Available PRN/intermittently Type of Home: House Home Access: Stairs to enter;Ramped entrance Entrance Stairs-Rails: Right Entrance  Stairs-Number of Steps: 6   Home Layout: One level Home Equipment: Cane - single point;Shower Counsellor (2 wheels) Additional Comments: info taken from document review        PT Goals (current goals can now be found in the care plan section)      Frequency    Min 3X/week      PT Plan Current plan remains appropriate       AM-PAC PT "6 Clicks" Mobility   Outcome Measure  Help needed turning from your back to your side while in a flat bed without using bedrails?: A Little Help needed moving from lying on your back to sitting on the side of a flat bed without using bedrails?: A Lot Help needed moving to and from a bed to a chair (including a wheelchair)?: A Lot Help needed standing up from a chair using your arms (e.g., wheelchair or bedside chair)?: A Lot Help needed to walk in hospital room?: A Lot Help needed climbing 3-5 steps with a railing? : A Lot 6 Click Score: 13    End of Session Equipment Utilized During Treatment: Gait belt Activity Tolerance: Patient tolerated treatment well;Patient limited by fatigue Patient left: with family/visitor present;with call bell/phone within reach;in chair Nurse Communication: Mobility status PT Visit Diagnosis: Unsteadiness on feet (R26.81);Other abnormalities of gait and mobility (R26.89);Muscle weakness (generalized) (M62.81);Other symptoms and signs involving the nervous system (R29.898)     Time: 1610-9604 PT Time Calculation (min) (ACUTE ONLY): 28 min  Charges:  $Therapeutic Activity: 23-37 mins                     Lurena Nida, PTA/CLT [facility] Ph: [phone]   Lurena Nida 02/11/2022, 11:51 AM

## 2022-02-11 NOTE — Plan of Care (Signed)
  Problem: Acute Rehab OT Goals (only OT should resolve) Goal: Pt. Will Perform Eating Flowsheets (Taken 02/11/2022 0951) Pt Will Perform Eating:  with mod assist  sitting Goal: Pt. Will Perform Grooming Flowsheets (Taken 02/11/2022 0951) Pt Will Perform Grooming:  with min assist  sitting Goal: Pt. Will Perform Upper Body Dressing Flowsheets (Taken 02/11/2022 0951) Pt Will Perform Upper Body Dressing:  with min assist  sitting Goal: Pt. Will Perform Lower Body Dressing Flowsheets (Taken 02/11/2022 0951) Pt Will Perform Lower Body Dressing:  with min assist  with adaptive equipment  with mod assist  sitting/lateral leans Goal: Pt. Will Transfer To Toilet Flowsheets (Taken 02/11/2022 [phone]) Pt Will Transfer to Toilet:  with min assist  stand pivot transfer Goal: Pt. Will Perform Toileting-Clothing Manipulation Flowsheets (Taken 02/11/2022 0951) Pt Will Perform Toileting - Clothing Manipulation and hygiene:  with min assist  sit to/from stand  with mod assist Goal: Pt/Caregiver Will Perform Home Exercise Program Flowsheets (Taken 02/11/2022 [phone]) Pt/caregiver will Perform Home Exercise Program:  Increased ROM  Increased strength  Right Upper extremity  With minimal assist Goal: OT Additional ADL Goal #1 Flowsheets (Taken 02/11/2022 0951) Additional ADL Goal #1: Pt will demonstrate improved visual fields by interacting with appropriately with objects in R field of view with set up assist 50% of trials.  Paul Bradshaw OT, MOT

## 2022-02-11 NOTE — Progress Notes (Signed)
Speech Language Pathology Treatment: Dysphagia  Patient Details Name: Paul Bradshaw MRN: [id number] DOB: May 30, 1926 Today's Date: 02/11/2022 Time: 6073-7106 SLP Time Calculation (min) (ACUTE ONLY): 40 min  Assessment / Plan / Recommendation Clinical Impression  Ongoing dysphagia treatment provided; Pt with improved alertness this am. Pt was awake and eyes remained opened without cues throughout treatment. SLP removed upper dentures and provided thorough oral care followed by single ice chip. Pt demonstrated timely oral control and manipulation of ice chip followed by immediate coughing, delayed throat clear and Pt was unable to generate voicing to assess vocal quality. Secondary to fluctuating alertness and immediate signs/symptoms of oropharyngeal dysphagia and acute stroke echo recommendation from diet check yesterday: continue NPO with oral care and OK for single small ice chips (RN may need to run the ice chip under the water to get to an appropriate size- pea sized ideally) when Pt is alert and willing for comfort and to encourage ongoing use of swallowing musculature. SLP reviewed recommendations with son and RN. ST will continue to follow to determine if/when Pt is appropriate for objective assessment    HPI HPI: Paul Bradshaw is a 88 y.o. male with medical history significant of hypertension, BPH, hyperlipidemia, history of carotid artery disease and chronic back pain with radiculopathy; who presented to the emergency department secondary to aphasia, inability to walk and right-sided weakness.  Patient was last seen normal prior to bed on 02/06/2022 (around 9:10 PM). noted to have MCA infarct and several occlusions on LVO. BSE and SLE ordred.      SLP Plan  Continue with current plan of care      Recommendations for follow up therapy are one component of a multi-disciplinary discharge planning process, led by the attending physician.  Recommendations may be updated based on patient status,  additional functional criteria and insurance authorization.    Recommendations  Diet recommendations: NPO Medication Administration: Via alternative means                Oral Care Recommendations: Oral care prior to ice chip/H20;Staff/trained caregiver to provide oral care Follow Up Recommendations: Follow physician's recommendations for discharge plan and follow up therapies Assistance recommended at discharge: Frequent or constant Supervision/Assistance SLP Visit Diagnosis: Dysphagia, unspecified (R13.10) Plan: Continue with current plan of care          Asaph Serena H. Romie Levee, CCC-SLP Speech Language Pathologist  Georgetta Haber  02/11/2022, 9:38 AM

## 2022-02-11 NOTE — Progress Notes (Signed)
Pt is alert today and is able to nod his head yes or no to communicate. PT was able to work with physical therapy  and has been in the chair since this morning. Pt has been bathed and shaved. Mouth care has also been completed multiple times this shift as ordered.

## 2022-02-11 NOTE — Progress Notes (Signed)
Progress Note   Patient: Paul Bradshaw HQI:[id number] DOB: 24-Aug-1925 DOA: 02/07/2022     4 DOS: the patient was seen and examined on 02/11/2022   Brief hospital admission course: Paul Bradshaw is a 88 y.o. male with medical history significant of hypertension, BPH, hyperlipidemia, history of carotid artery disease and chronic back pain with radiculopathy; who presented to the emergency department secondary to aphasia, inability to walk and right-sided weakness.  Patient was last seen normal prior to bed on 02/06/2022 (around 9:10 PM).    Symptoms remains present at time of evaluation; there has not been any chest pain, shortness of breath, fever, dysuria, hematuria, melena, hematochezia, abdominal pain, sick contacts or any other complaints.   Work-up in the ED demonstrated positive left acute MCA ischemic stroke with surrounding abnormalities and large vessel occlusion.  Case discussed with neurology service who after talking with patient's son and his age/comorbidities decision was made to treat was treatable and completed stroke work-up without aggressive/invasive intervention.  Overall prognosis is guarded.  Assessment and Plan: * Acute ischemic stroke [facility]) -Patient with acute left MCA stroke appreciated -Neurology recommended completed stroke work-up and pursue treatment with dual antiplatelet therapy for 90 days. -A1c 5.4 and lipid panel demonstrating stable cholesterol level with current statin dose. -Continue PT/OT and speech therapy evaluation and follow recommendations. -Has been found high risk for aspiration; and he is pending modified barium swallow test to further determine diet consistency. -Family has once again reiterated no invasive treatment no heroic measures. -Palliative care has been consulted and will follow patient recovery or further decline to determine next venue. -On today's examination patient with expressive aphasia, right-sided hemiparesis affecting mainly his arm  unable to follow commands or comprehend. -Continue to follow clinical response. -Patient is DNR/DNI  Glaucoma -Continue treatment with Alphagan and Xalatan.   Essential hypertension -Mostly borderline and no using antihypertensive agents as an outpatient -Allowing permissive hypertension in the setting of acute ischemic stroke. -Follow-up vital signs and determine the need for antihypertensive agents (looking for treatment on his blood pressure after first 48-72 hours for an ischemic event).  Pressure injury of skin -Stage I perineum/medial sacrum pressure injury appreciated at time of admission. -No signs of superimposed infection -Continue constant repositioning, preventive measures and local care.  Leukocytosis -No signs of acute infection appreciated -Most likely stress demargination -Repeat CBC in a.m. to follow WBCs trend/stability.   BPH (benign prostatic hyperplasia) -No complaints of urinary retention -Resume the use of Flomax and Proscar when able to take p.o.'s medication.   Subjective:  Afebrile, no chest pain, no nausea, no vomiting.  Right hemiparesis affecting upper extremity, right facial droop and expressive aphasia demonstrated on examination.  Able to communicate by squeezing hand with his left upper extremity and nodding.  Patient is following commands  Physical Exam: Vitals:   02/10/22 2150 02/10/22 2254 02/11/22 0421 02/11/22 1400  BP:  (!) 160/79 (!) 161/71 136/74  Pulse:  (!) 104 88 92  Resp:  20 20 18   Temp: (!) 101.7 F (38.7 C) 97.6 F (36.4 C) 97.7 F (36.5 C) 98.4 F (36.9 C)  TempSrc: Oral Oral Oral Oral  SpO2:  97% 97% 95%  Weight:      Height:       General exam: Alert, awake, following commands unable to communicate by squeezing hands and nodding.  Right hemiparesis affecting mainly his upper extremity, facial droop and expressive aphasia still present. Respiratory system: Clear to auscultation. Respiratory effort normal.  No  using  accessory muscles. Cardiovascular system:RRR. No rubs or gallops; no JVD. Gastrointestinal system: Abdomen is nondistended, soft and nontender. No organomegaly or masses felt. Normal bowel sounds heard. Central nervous system: Right facial droop, right hemiparesis affecting mainly his upper extremity and expressive aphasia appreciated. Extremities: No sideor clubbing. Skin: No petechiae.  There is no signs of superimposed infection and pressure injury present at time of admission. Psychiatry: Stable mood.  Data Reviewed: Patient less responsive today and also demonstrating respiratory distress and tachypnea. CT scan of the head without contrast to follow stroke/previous several edema seen and also chest x-ray ordered on 02/09/22: No cardiopulmonary process appreciated.  Further evaluation of left MCA stroke appreciated with some demonstration of edema but no midline shift.  No new intracranial abnormalities or hemorrhagic conversion appreciated.. Patient's son has once again expressed no invasive or heroic interventions desired. Palliative care consulted.  Family Communication: Son at bedside.   Disposition: Status is: Inpatient Remains inpatient appropriate because: Stroke work-up has been completed; patient alert, demonstrating good comprehension well following commands and having expressive aphasia, right hemiparesis (affecting mainly his upper extremity) and right facial droop.  Pending speech therapy evaluation to determine diet consistency tolerance.   Planned Discharge Destination: To be determined; currently based on active decline and inability to secure hydration/nutrition on his own patient most likely will just qualify for residential hospice.  Family not interested on invasive/heroic intervention.   Author: Vassie Loll, MD 02/11/2022 5:27 PM  For on call review [email or web address].

## 2022-02-11 NOTE — Evaluation (Signed)
Occupational Therapy Evaluation Patient Details Name: Paul Bradshaw MRN: [id number] DOB: October 10, 1925 Today's Date: 02/11/2022   History of Present Illness Paul Bradshaw is a 88 y.o. male with medical history significant of hypertension, BPH, hyperlipidemia, history of carotid artery disease and chronic back pain with radiculopathy; who presented to the emergency department secondary to aphasia, inability to walk and right-sided weakness.  Patient was last seen normal prior to bed on 02/06/2022 (around 9:10 PM).   Clinical Impression   Pt agreeable to OT evaluation. Pt non-verbal with only intermittent vocalization and hand gestures. Pt was independent at baseline and cared for spouse. Today pt demonstrates with flaccid R UE without any movement noted at all. Pt required mod to max A for bed mobility with good sitting balance and poor standing balance with mod to max A for sit to stand from EOB. Pt has difficulty following commands and presents with L visual field preference and difficulty tracking objects for more than a couple seconds. Pt was unable to take side steps today and is generally very unsteady in standing and kyphotic. Pt will benefit from continued OT in the hospital and recommended venue below to increase strength, balance, and endurance for safe ADL's.        Recommendations for follow up therapy are one component of a multi-disciplinary discharge planning process, led by the attending physician.  Recommendations may be updated based on patient status, additional functional criteria and insurance authorization.   Follow Up Recommendations  Acute inpatient rehab (3hours/day)    Assistance Recommended at Discharge Frequent or constant Supervision/Assistance  Patient can return home with the following A lot of help with walking and/or transfers;A lot of help with bathing/dressing/bathroom;Assistance with cooking/housework;Assistance with feeding;Direct supervision/assist for medications  management;Assist for transportation;Help with stairs or ramp for entrance    Functional Status Assessment  Patient has had a recent decline in their functional status and demonstrates the ability to make significant improvements in function in a reasonable and predictable amount of time.  Equipment Recommendations  None recommended by OT    Recommendations for Other Services       Precautions / Restrictions Precautions Precautions: Fall Restrictions Weight Bearing Restrictions: No      Mobility Bed Mobility Overal bed mobility: Needs Assistance Bed Mobility: Supine to Sit, Sit to Supine     Supine to sit: Mod assist, Max assist, HOB elevated Sit to supine: Mod assist, Max assist   General bed mobility comments: Labore movement with assist to move B LE and torso.    Transfers Overall transfer level: Needs assistance   Transfers: Sit to/from Stand Sit to Stand: Mod assist, Max assist           General transfer comment: No AD used due to flaccid R UE. This therapist assisted anteriorly. Pt was able to stand but unable to take step to L side when prompted.      Balance Overall balance assessment: Needs assistance Sitting-balance support: Single extremity supported, Feet supported Sitting balance-Leahy Scale: Good Sitting balance - Comments: good/fair seated EOB   Standing balance support: Bilateral upper extremity supported, During functional activity Standing balance-Leahy Scale: Poor Standing balance comment: no AD                           ADL either performed or assessed with clinical judgement   ADL Overall ADL's : Needs assistance/impaired Eating/Feeding: Total assistance (Pt reportedly unable to swallow at this time.)  Grooming: Sitting;Moderate assistance;Maximal assistance   Upper Body Bathing: Moderate assistance;Maximal assistance;Sitting   Lower Body Bathing: Maximal assistance;Total assistance;Sitting/lateral leans   Upper Body  Dressing : Moderate assistance;Maximal assistance;Sitting   Lower Body Dressing: Maximal assistance;Total assistance;Sitting/lateral leans   Toilet Transfer: Moderate assistance;Maximal assistance;Stand-pivot Toilet Transfer Details (indicate cue type and reason): Partially simulated via sit to stand from EOB. Pt unable to take steps to L side. Toileting- Clothing Manipulation and Hygiene: Total assistance;Bed level   Tub/ Shower Transfer: Maximal assistance;Stand-pivot;Tub bench   Functional mobility during ADLs: Maximal assistance       Vision Baseline Vision/History: 1 Wears glasses Patient Visual Report:  (Pt unable to report visual history or current vision changes.) Vision Assessment?: Yes Alignment/Gaze Preference: Gaze left Tracking/Visual Pursuits:  (Moderate difficulty tracking in all planes. Slow tracking with loss of object after a few seconds.) Visual Fields: Other (comment) (Possible R visual field deficit based on L gaze preference. Difficulty to assess due to pt's cognitive and communication deficits.)                Pertinent Vitals/Pain Pain Assessment Pain Assessment: Faces Faces Pain Scale: No hurt     Hand Dominance Right   Extremity/Trunk Assessment Upper Extremity Assessment Upper Extremity Assessment: RUE deficits/detail RUE Deficits / Details: Flaccid R UE. No tone. Pt unable to report on sensation but likely limited. RUE Coordination: decreased fine motor;decreased gross motor   Lower Extremity Assessment Lower Extremity Assessment: Defer to PT evaluation   Cervical / Trunk Assessment Cervical / Trunk Assessment: Kyphotic   Communication Communication Communication: HOH;Expressive difficulties   Cognition Arousal/Alertness: Awake/alert Behavior During Therapy: WFL for tasks assessed/performed, Flat affect Overall Cognitive Status: Impaired/Different from baseline                                 General Comments: Pt  struggled to follow commands or respond to history questions. No verbalization but only vocalization at times.                      Home Living Family/patient expects to be discharged to:: Private residence Living Arrangements: Spouse/significant other Available Help at Discharge: Family;Available PRN/intermittently Type of Home: House Home Access: Stairs to enter;Ramped entrance Entrance Stairs-Number of Steps: 6 Entrance Stairs-Rails: Right Home Layout: One level     Bathroom Shower/Tub: Chief Strategy Officer: Standard     Home Equipment: Cane - single point;Shower Counsellor (2 wheels)   Additional Comments: info taken from document review      Prior Functioning/Environment Prior Level of Function : Independent/Modified Independent             Mobility Comments: Household and short distanced community ambulator using RW PRN ADLs Comments: ind with ADLs/IADLs, cares for disabled spouse        OT Problem List: Decreased strength;Decreased range of motion;Decreased activity tolerance;Impaired balance (sitting and/or standing);Impaired vision/perception;Decreased coordination;Decreased cognition;Impaired sensation;Impaired tone;Impaired UE functional use      OT Treatment/Interventions: Self-care/ADL training;Therapeutic exercise;Therapeutic activities;Patient/family education;Balance training;Cognitive remediation/compensation;Visual/perceptual remediation/compensation;Neuromuscular education;DME and/or AE instruction    OT Goals(Current goals can be found in the care plan section) Acute Rehab OT Goals Patient Stated Goal: none stated OT Goal Formulation: Patient unable to participate in goal setting Time For Goal Achievement: 02/25/22 Potential to Achieve Goals: Fair  OT Frequency: Min 2X/week  End of Session Equipment Utilized During Treatment: Gait belt  Activity Tolerance: Patient  tolerated treatment well Patient left: in bed;with call bell/phone within reach;with bed alarm set  OT Visit Diagnosis: Unsteadiness on feet (R26.81);Other abnormalities of gait and mobility (R26.89);Muscle weakness (generalized) (M62.81);Cognitive communication deficit (R41.841);Other symptoms and signs involving the nervous system (R29.898);Hemiplegia and hemiparesis Symptoms and signs involving cognitive functions: Cerebral infarction Hemiplegia - Right/Left: Right Hemiplegia - dominant/non-dominant: Dominant Hemiplegia - caused by: Cerebral infarction                Time: 0981-1914 OT Time Calculation (min): 16 min Charges:  OT General Charges $OT Visit: 1 Visit OT Evaluation $OT Eval Low Complexity: 1 Low  [place] OT, MOT  Danie Chandler 02/11/2022, 9:47 AM

## 2022-02-12 DIAGNOSIS — Z7189 Other specified counseling: Secondary | ICD-10-CM | POA: Diagnosis not present

## 2022-02-12 DIAGNOSIS — I639 Cerebral infarction, unspecified: Secondary | ICD-10-CM | POA: Diagnosis not present

## 2022-02-12 DIAGNOSIS — Z515 Encounter for palliative care: Secondary | ICD-10-CM | POA: Diagnosis not present

## 2022-02-12 DIAGNOSIS — R1310 Dysphagia, unspecified: Secondary | ICD-10-CM | POA: Diagnosis not present

## 2022-02-12 LAB — GLUCOSE, CAPILLARY
Glucose-Capillary: 104 mg/dL — ABNORMAL HIGH (ref 70–99)
Glucose-Capillary: 109 mg/dL — ABNORMAL HIGH (ref 70–99)
Glucose-Capillary: 111 mg/dL — ABNORMAL HIGH (ref 70–99)
Glucose-Capillary: 111 mg/dL — ABNORMAL HIGH (ref 70–99)
Glucose-Capillary: 117 mg/dL — ABNORMAL HIGH (ref 70–99)
Glucose-Capillary: 129 mg/dL — ABNORMAL HIGH (ref 70–99)
Glucose-Capillary: 129 mg/dL — ABNORMAL HIGH (ref 70–99)

## 2022-02-12 NOTE — Progress Notes (Signed)
PROGRESS NOTE  Paul SLAVENS [id number] DOB: 01-11-1926 DOA: 02/07/2022 PCP: Baruch Gouty, FNP   LOS: 5 days   Brief Narrative / Interim history: 88 year old male with HTN, BPH, HLD, CAD, comes into the hospital on 8/18 with aphasia, inability to walk, right-sided weakness and facial droop.  He was found to have a left acute MCA ischemic stroke with surrounding abnormalities and large vessel occlusion.  Case was discussed with neurology, and after talking with patient's son and given age, comorbidities the decision was made to treat the treatable, complete the work-up without any aggressive interventions.  Pertinent data: CT head 8/18-cytotoxic edema suspected in the left MCA territory, no hemorrhage CTA 8/18-acute left MCA ischemia, near occlusion of left ICA origin and bulb, age-indeterminate but acute appearing thrombus in the left side found with severe stenosis and multiple MCA branch occlusions. Brain MRI 8/18-acute infarct of the left insula, frontal operculum and high frontal lobe and MCA distribution CT head 8/18-hemorrhage or hemorrhagic conversion of the left MCA stroke Chest x-ray 8/20-no active disease 2D echo 8/19-EF 65-70%, no WMA, LVH, RV was normal  Subjective / 24h Interval events: Aphasic, appears alert and tracks me with her eyes, follows commands but cannot speak.  Assesement and Plan: Principal Problem:   Acute ischemic stroke (HCC) Active Problems:   BPH (benign prostatic hyperplasia)   Leukocytosis   Pressure injury of skin   Essential hypertension   Glaucoma  Principal problem Acute CVA- on imaging, he was found to have acute left MCA stroke.  Neurology recommending dual antiplatelet therapy for 90 days.  Stroke work-up as above, including 2D echo, CT angiogram head and neck.  Lipid panel shows an LDL of 34  Lab Results  Component Value Date   HGBA1C 5.4 02/07/2022   Active problems Glaucoma -Continue treatment with Alphagan and Xalatan.    Essential hypertension -Mostly borderline and no using antihypertensive agents as an outpatient. Allowing permissive hypertension in the setting of acute ischemic stroke.   Pressure injury of skin -Stage I perineum/medial sacrum pressure injury appreciated at time of admission.   Leukocytosis -No signs of acute infection appreciated, most likely stress demargination -Repeat CBC in a.m. to follow WBCs trend/stability.   BPH (benign prostatic hyperplasia) -No complaints of urinary retention   Goals of care-palliative care consulted, treat the treatable, DNR/DNI.  Passed swallow eval today and he is allowed a dysphagia 1 diet.  Inpatient rehab is considering admitting him  Scheduled Meds:  aspirin  300 mg Rectal Daily   atorvastatin  10 mg Oral q1800   brimonidine  1 drop Both Eyes TID   clopidogrel  75 mg Oral Daily   heparin  5,000 Units Subcutaneous Q8H   latanoprost  1 drop Both Eyes QPM   mouth rinse  15 mL Mouth Rinse 4 times per day   pantoprazole (PROTONIX) IV  40 mg Intravenous Q24H   Continuous Infusions: PRN Meds:.[DISCONTINUED] acetaminophen **OR** acetaminophen (TYLENOL) oral liquid 160 mg/5 mL **OR** acetaminophen, mouth rinse, senna-docusate  Diet Orders (From admission, onward)     Start     Ordered   02/12/22 1305  DIET - DYS 1 Room service appropriate? Yes; Fluid consistency: Nectar Thick  Diet effective now       Comments: 100% feeder assist/supervision; small sips/bites, discontinue feeding if coughing or not alert; po medications crushed as able in puree  Question Answer Comment  Room service appropriate? Yes   Fluid consistency: Nectar Thick      02/12/22 1305  DVT prophylaxis: heparin injection 5,000 Units Start: 02/07/22 1400   Lab Results  Component Value Date   PLT 205 02/10/2022      Code Status: DNR  Family Communication: None at bedside  Status is: Inpatient Remains inpatient appropriate because: Rehab placement   Level of  care: Telemetry  Consultants:  Neurology Palliative care  Objective: Vitals:   02/11/22 1400 02/11/22 2032 02/12/22 0459 02/12/22 1406  BP: 136/74 (!) 145/83 (!) 165/78 (!) 155/66  Pulse: 92 (!) 108 94 (!) 102  Resp: 18 18 17 16   Temp: 98.4 F (36.9 C) 99 F (37.2 C) 98.9 F (37.2 C) 99.3 F (37.4 C)  TempSrc: Oral Oral Oral Oral  SpO2: 95% 96% 96% 96%  Weight:      Height:        Intake/Output Summary (Last 24 hours) at 02/12/2022 1415 Last data filed at 02/12/2022 0500 Gross per 24 hour  Intake 0 ml  Output 700 ml  Net -700 ml   Wt Readings from Last 3 Encounters:  02/07/22 67.9 kg  01/14/22 72.5 kg  12/13/21 74.8 kg    Examination:  Constitutional: NAD Eyes: no scleral icterus ENMT: Mucous membranes are moist.  Neck: normal, supple Respiratory: clear to auscultation bilaterally, no wheezing, no crackles. Normal respiratory effort. No accessory muscle use.  Cardiovascular: Regular rate and rhythm, no murmurs / rubs / gallops. Abdomen: non distended, no tenderness. Bowel sounds positive.  Musculoskeletal: no clubbing / cyanosis.  Skin: no rashes   Data Reviewed: I have independently reviewed following labs and imaging studies   CBC Recent Labs  Lab 02/07/22 0922 02/07/22 0931 02/10/22 0548  WBC 11.8*  --  13.8*  HGB 12.9* 13.6 13.6  HCT 38.7* 40.0 42.9  PLT 235  --  205  MCV 97.0  --  100.5*  MCH 32.3  --  31.9  MCHC 33.3  --  31.7  RDW 12.2  --  11.9  LYMPHSABS 1.6  --   --   MONOABS 0.9  --   --   EOSABS 0.1  --   --   BASOSABS 0.0  --   --     Recent Labs  Lab 02/07/22 0922 02/07/22 0931 02/08/22 0155 02/10/22 0548  NA 138 138 136 138  K 3.6 3.7 3.6 3.6  CL 103 100 103 105  CO2 25  --  22 22  GLUCOSE 148* 145* 108* 106*  BUN 17 15 18  24*  CREATININE 0.92 0.90 0.76 0.68  CALCIUM 8.9  --  8.5* 8.9  AST 23  --   --   --   ALT 16  --   --   --   ALKPHOS 53  --   --   --   BILITOT 1.3*  --   --   --   ALBUMIN 3.9  --   --   --    INR 1.2  --   --   --   HGBA1C 5.4  --   --   --     ------------------------------------------------------------------------------------------------------------------ No results for input(s): "CHOL", "HDL", "LDLCALC", "TRIG", "CHOLHDL", "LDLDIRECT" in the last 72 hours.  Lab Results  Component Value Date   HGBA1C 5.4 02/07/2022   ------------------------------------------------------------------------------------------------------------------ No results for input(s): "TSH", "T4TOTAL", "T3FREE", "THYROIDAB" in the last 72 hours.  Invalid input(s): "FREET3"  Cardiac Enzymes No results for input(s): "CKMB", "TROPONINI", "MYOGLOBIN" in the last 168 hours.  Invalid input(s): "CK" ------------------------------------------------------------------------------------------------------------------ No results found for: "BNP"  CBG:  Recent Labs  Lab 02/11/22 2119 02/12/22 0022 02/12/22 0414 02/12/22 0726 02/12/22 1103  GLUCAP 118* 111* 129* 109* 111*    Recent Results (from the past 240 hour(s))  Resp Panel by RT-PCR (Flu A&B, Covid) Anterior Nasal Swab     Status: None   Collection Time: 02/07/22 11:19 AM   Specimen: Anterior Nasal Swab  Result Value Ref Range Status   SARS Coronavirus 2 by RT PCR NEGATIVE NEGATIVE Final    Comment: (NOTE) SARS-CoV-2 target nucleic acids are NOT DETECTED.  The SARS-CoV-2 RNA is generally detectable in upper respiratory specimens during the acute phase of infection. The lowest concentration of SARS-CoV-2 viral copies this assay can detect is 138 copies/mL. A negative result does not preclude SARS-Cov-2 infection and should not be used as the sole basis for treatment or other patient management decisions. A negative result may occur with  improper specimen collection/handling, submission of specimen other than nasopharyngeal swab, presence of viral mutation(s) within the areas targeted by this assay, and inadequate number of  viral copies(<138 copies/mL). A negative result must be combined with clinical observations, patient history, and epidemiological information. The expected result is Negative.  Fact Sheet for Patients:  BloggerCourse.com  Fact Sheet for Healthcare Providers:  SeriousBroker.it  This test is no t yet approved or cleared by the [place] FDA and  has been authorized for detection and/or diagnosis of SARS-CoV-2 by FDA under an Emergency Use Authorization (EUA). This EUA will remain  in effect (meaning this test can be used) for the duration of the COVID-19 declaration under Section 564(b)(1) of the Act, 21 U.S.C.section 360bbb-3(b)(1), unless the authorization is terminated  or revoked sooner.       Influenza A by PCR NEGATIVE NEGATIVE Final   Influenza B by PCR NEGATIVE NEGATIVE Final    Comment: (NOTE) The Xpert Xpress SARS-CoV-2/FLU/RSV plus assay is intended as an aid in the diagnosis of influenza from Nasopharyngeal swab specimens and should not be used as a sole basis for treatment. Nasal washings and aspirates are unacceptable for Xpert Xpress SARS-CoV-2/FLU/RSV testing.  Fact Sheet for Patients: BloggerCourse.com  Fact Sheet for Healthcare Providers: SeriousBroker.it  This test is not yet approved or cleared by the [place] FDA and has been authorized for detection and/or diagnosis of SARS-CoV-2 by FDA under an Emergency Use Authorization (EUA). This EUA will remain in effect (meaning this test can be used) for the duration of the COVID-19 declaration under Section 564(b)(1) of the Act, 21 U.S.C. section 360bbb-3(b)(1), unless the authorization is terminated or revoked.  Performed at [facility], [address], [place], [place] [address]   Culture, blood (Routine X 2) w Reflex to ID Panel     Status: None (Preliminary result)   Collection Time: 02/10/22 11:11  PM   Specimen: BLOOD LEFT HAND  Result Value Ref Range Status   Specimen Description BLOOD LEFT HAND  Final   Special Requests   Final    BOTTLES DRAWN AEROBIC AND ANAEROBIC Blood Culture adequate volume   Culture   Final    NO GROWTH 2 DAYS Performed at [facility], [address] [place], [place] [address]    Report Status PENDING  Incomplete  Culture, blood (Routine X 2) w Reflex to ID Panel     Status: None (Preliminary result)   Collection Time: 02/10/22 11:15 PM   Specimen: BLOOD RIGHT HAND  Result Value Ref Range Status   Specimen Description BLOOD RIGHT HAND  Final   Special Requests  Final    BOTTLES DRAWN AEROBIC AND ANAEROBIC Blood Culture adequate volume   Culture   Final    NO GROWTH 2 DAYS Performed at [facility], [address], [place], [place] [address]    Report Status PENDING  Incomplete     Radiology Studies: No results found.   Marzetta Board, MD, PhD [facility]  Between 7 am - 7 pm I am available, please contact me via Amion (for emergencies) or Securechat (non urgent messages)  Between 7 pm - 7 am I am not available, please contact night coverage MD/APP via Amion

## 2022-02-12 NOTE — Progress Notes (Signed)
Speech Language Pathology Treatment: Dysphagia  Patient Details Name: Paul Bradshaw MRN: [id number] DOB: Jan 23, 1926 Today's Date: 02/12/2022 Time: 1225-1300 SLP Time Calculation (min) (ACUTE ONLY): 35 min  Assessment / Plan / Recommendation Clinical Impression  Pt seen for ongoing dysphagia intervention. Pt with improved alertness and responsiveness today. Pt continues to be nonverbal due to severity of aphasia, however he is able to follow simple commands and imitate gestures. Yes/no responses appear to be ~75% accurate. Oral care completed. Pt given a toothette and encouraged to clean his mouth, which he was able to do. Pt presented with ice chips, thin water, NTL, and puree. Pt with reduced labial closure on his right, which resulted in labial spillage with thins and NTL when self presenting. Pt was able to draw NTL up through the straw, but unable to get in a sufficient amount. Pt with 2 episodes of strong coughing after sips of thin water (suspect premature spillage and delay in swallow trigger). He exhibited improved performance with NTL and puree and consumed 4 ounces of each over 30 minute session. Pt with delayed throat clearing after he took large sip NTL. Recommend initiating D1/puree and NTL via CUP sips and 100% supervision with PO intake for controlling rate and bite/sip size. Given Pt's fluctuating alertness, please hold POs if not alert or coughing. Medications can be presented crushed as able in puree. SLP will continue to follow and determine readiness for MBSS tomorrow AM. Above to care team.     HPI HPI: Paul Bradshaw is a 88 y.o. male with medical history significant of hypertension, BPH, hyperlipidemia, history of carotid artery disease and chronic back pain with radiculopathy; who presented to the emergency department secondary to aphasia, inability to walk and right-sided weakness.  Patient was last seen normal prior to bed on 02/06/2022 (around 9:10 PM). noted to have MCA infarct  and several occlusions on LVO. BSE and SLE ordred.      SLP Plan  Continue with current plan of care;MBS (MBSS if appropriate on Thursday)      Recommendations for follow up therapy are one component of a multi-disciplinary discharge planning process, led by the attending physician.  Recommendations may be updated based on patient status, additional functional criteria and insurance authorization.    Recommendations  Diet recommendations: Dysphagia 1 (puree);Nectar-thick liquid Liquids provided via: Cup;No straw Medication Administration: Crushed with puree Supervision: Staff to assist with self feeding;Full supervision/cueing for compensatory strategies Compensations: Minimize environmental distractions;Slow rate;Small sips/bites Postural Changes and/or Swallow Maneuvers: Seated upright 90 degrees;Upright 30-60 min after meal                Oral Care Recommendations: Oral care BID;Oral care before and after PO;Staff/trained caregiver to provide oral care Follow Up Recommendations: Acute inpatient rehab (3hours/day) Assistance recommended at discharge: Frequent or constant Supervision/Assistance SLP Visit Diagnosis: Dysphagia, unspecified (R13.10) Plan: Continue with current plan of care;MBS (MBSS if appropriate on Thursday)          Thank you,  Havery Moros, CCC-SLP [phone]  Tamia Dial  02/12/2022, 1:10 PM

## 2022-02-12 NOTE — Progress Notes (Signed)
Palliative: Mr. Vivek, Grealish, is lying quietly in bed.  He appears acutely ill and somewhat frail.  He will briefly make but not keep eye contact.  He has aphasia from his recent stroke, and I do not believe that he can make his basic needs known.  There is no family present at bedside, but his son [name] girlfriend, Sedalia Muta, comes into the room as I am leaving.  She tells me that [name] had a fever last night and did not want to come to the hospital.  I asked [name] how he is doing.  He makes a face and looks away.  [name] is awaiting MBBS.  This will help determine direction of care.  [name] shares that she and [name] have talked about hospice care.  She shares that [name] is "hopeful, but realistic that his father will need hospice care if he is unable to safely eat and drink.  Conference with speech therapy who has now placed Mr. O'Neill on a diet.  Conference with attending, bedside nursing staff, transition of care team, speech therapy related to patient condition, needs, goals of care, disposition.  Plan:   At this point continue to treat the treatable but no CPR or intubation.  Seen by speech therapy who has placed diet orders.  50 minutes  Lillia Carmel, NP Palliative medicine team Team phone [phone] Greater than 50% of this time was spent counseling and coordinating care related to the above assessment and plan.

## 2022-02-13 ENCOUNTER — Inpatient Hospital Stay (HOSPITAL_COMMUNITY): Payer: Medicare Other

## 2022-02-13 DIAGNOSIS — Z515 Encounter for palliative care: Secondary | ICD-10-CM | POA: Diagnosis not present

## 2022-02-13 DIAGNOSIS — I639 Cerebral infarction, unspecified: Secondary | ICD-10-CM | POA: Diagnosis not present

## 2022-02-13 DIAGNOSIS — R1310 Dysphagia, unspecified: Secondary | ICD-10-CM | POA: Diagnosis not present

## 2022-02-13 DIAGNOSIS — Z7189 Other specified counseling: Secondary | ICD-10-CM | POA: Diagnosis not present

## 2022-02-13 LAB — CBC
HCT: 38.9 % — ABNORMAL LOW (ref 39.0–52.0)
Hemoglobin: 12.4 g/dL — ABNORMAL LOW (ref 13.0–17.0)
MCH: 32.3 pg (ref 26.0–34.0)
MCHC: 31.9 g/dL (ref 30.0–36.0)
MCV: 101.3 fL — ABNORMAL HIGH (ref 80.0–100.0)
Platelets: 248 10*3/uL (ref 150–400)
RBC: 3.84 MIL/uL — ABNORMAL LOW (ref 4.22–5.81)
RDW: 11.9 % (ref 11.5–15.5)
WBC: 10.4 10*3/uL (ref 4.0–10.5)
nRBC: 0 % (ref 0.0–0.2)

## 2022-02-13 LAB — COMPREHENSIVE METABOLIC PANEL
ALT: 36 U/L (ref 0–44)
AST: 58 U/L — ABNORMAL HIGH (ref 15–41)
Albumin: 3.1 g/dL — ABNORMAL LOW (ref 3.5–5.0)
Alkaline Phosphatase: 69 U/L (ref 38–126)
Anion gap: 10 (ref 5–15)
BUN: 38 mg/dL — ABNORMAL HIGH (ref 8–23)
CO2: 27 mmol/L (ref 22–32)
Calcium: 9.1 mg/dL (ref 8.9–10.3)
Chloride: 106 mmol/L (ref 98–111)
Creatinine, Ser: 0.8 mg/dL (ref 0.61–1.24)
GFR, Estimated: >60 mL/min (ref >60)
Glucose, Bld: 120 mg/dL — ABNORMAL HIGH (ref 70–99)
Potassium: 3.3 mmol/L — ABNORMAL LOW (ref 3.5–5.1)
Sodium: 143 mmol/L (ref 135–145)
Total Bilirubin: 1.2 mg/dL (ref 0.3–1.2)
Total Protein: 6.9 g/dL (ref 6.5–8.1)

## 2022-02-13 LAB — GLUCOSE, CAPILLARY
Glucose-Capillary: 128 mg/dL — ABNORMAL HIGH (ref 70–99)
Glucose-Capillary: 109 mg/dL — ABNORMAL HIGH (ref 70–99)
Glucose-Capillary: 164 mg/dL — ABNORMAL HIGH (ref 70–99)
Glucose-Capillary: 141 mg/dL — ABNORMAL HIGH (ref 70–99)
Glucose-Capillary: 173 mg/dL — ABNORMAL HIGH (ref 70–99)

## 2022-02-13 LAB — MAGNESIUM: Magnesium: 2.1 mg/dL (ref 1.7–2.4)

## 2022-02-13 MED ORDER — LABETALOL HCL 5 MG/ML IV SOLN
10.0000 mg | INTRAVENOUS | Status: DC | PRN
  Administered 2022-02-14: 10 mg via INTRAVENOUS
  Filled 2022-02-13: qty 4

## 2022-02-13 MED ORDER — POTASSIUM CHLORIDE CRYS ER 20 MEQ PO TBCR
40.0000 meq | EXTENDED_RELEASE_TABLET | ORAL | Status: AC
  Administered 2022-02-13 (×2): 40 meq via ORAL
  Filled 2022-02-13 (×2): qty 2

## 2022-02-13 NOTE — Progress Notes (Addendum)
PT Cancellation Note  Patient Details Name: PEARSE SHIFFLER MRN: [id number] DOB: [birth year]   Cancelled Treatment:    Reason Eval/Treat Not Completed: Patient at procedure or test/unavailable. Family reports patient is off floor for MBBS testing. Family also reports patient was out of bed to chair for breakfast today. PT will follow up later as time allows.   Katina Dung. Hartnett-Rands, MS, PT [facility] [place] [phone]  Britta Mccreedy  Hartnett-Rands 02/13/2022, 11:28 AM

## 2022-02-13 NOTE — Progress Notes (Signed)
SLP  Note  Patient Details Name: Paul Bradshaw MRN: [id number] DOB: March 19, 1926   Note:       Reason Eval/Treat Not Completed: Other (comment) (Pt alert and sitting up in recliner eating his breakfast meal. Will proceed with MBSS today at 10:30)   Thank you,  Havery Moros, CCC-SLP [phone]  Paul Bradshaw 02/13/2022, 9:29 AM

## 2022-02-13 NOTE — Progress Notes (Addendum)
  Inpatient Rehabilitation Admissions Coordinator   I spoke with son, Casimiro Needle and his friend, Sedalia Muta, by phone. We discussed goals and expectations of a possible CIR admit. They prefer CIR for rehab, not SNF. Family can provide expected caregiver support that is recommended of supervision/min assist level after a CIR rehab stay of  2 weeks. They plan for patient to live with them. I will begin insurance Auth with [facility] medicare once I have available updated PT and OT clinicals. I have updated acute team and TOC of need. [employer] medicare/[employer] typically takes 24 to 72 hrs to make a determination once clinicals are provided. Casimiro Needle reports APS has been contacted due to issues with pt's wife ,Kennon Rounds and her caregivers.Please call me with any questions.  Ottie Glazier, RN, MSN Rehab Admissions Coordinator [phone]

## 2022-02-13 NOTE — Progress Notes (Signed)
Modified Barium Swallow Progress Note  Patient Details  Name: Paul Bradshaw MRN: [id number] Date of Birth: Sep 30, 1925  Today's Date: 02/13/2022  Modified Barium Swallow completed.  Full report located under Chart Review in the Imaging Section.  Brief recommendations include the following:  Clinical Impression  Pt presents with mod/severe oropharyngeal dysphagia in setting of dense right hemiparesis (with aphasia) with reduced ability to follow commands for compensation. Oral phase is marked by impaired lingual movement resulting in delayed oral transit, reduce bolus cohesiveness, premature spillage, piecemeal deglutition, and right buccal pocketing; Pharyngeal phase is marked by premature spillage with thins and nectars to the pyriforms with delay in swallow initiation, reduced tongue base retraction, epiglottic deflection, pharyngeal pressure and laryngeal closure resulting in penetration and aspiration in trace to mi/mod amount with thins after the swallow (occurred and was sensed x1 and unsensed x1 and not removed), and base of tongue, vallecular, and lateral channel residue with thins, NTL, HTL, and purees. Pt is at risk for aspiration and inability to meet nutritional needs, however family  as expressed that feeding tubes are not within Pt's goals of care and would like to proceed with "safest diet" with least risk of aspiration. Recommend D1/puree and HTL via cup/tsp and hopeful progression to NTL at next venue. Pt is motivated and alert, but has difficulty swallowing and coughing on command. Encourage repeat swallows as able, lingual sweep, and assist from feeder to clear right buccal cavity. Recommend dysphagia and aphasia therapy in acute setting and at next venue. PO medications crushed as able in puree, feeder assist. Results of this study were reviewed with family.   Swallow Evaluation Recommendations       SLP Diet Recommendations: Dysphagia 1 (Puree) solids;Honey thick liquids    Liquid Administration via: Cup;Spoon   Medication Administration: Crushed with puree   Supervision: Patient able to self feed;Staff to assist with self feeding;Full supervision/cueing for compensatory strategies   Compensations: Minimize environmental distractions;Slow rate;Lingual sweep for clearance of pocketing;Monitor for anterior loss;Multiple dry swallows after each bite/sip;Clear throat intermittently   Postural Changes: Remain semi-upright after after feeds/meals (Comment);Seated upright at 90 degrees   Oral Care Recommendations: Oral care before and after PO;Staff/trained caregiver to provide oral care   Other Recommendations: Prohibited food (jello, ice cream, thin soups);Order thickener from pharmacy;Clarify dietary restrictions   Thank you,  Havery Moros, CCC-SLP [phone]  Paul Bradshaw 02/13/2022,12:59 PM

## 2022-02-13 NOTE — Progress Notes (Signed)
PROGRESS NOTE  Paul Bradshaw ZOX:[id number] DOB: 03/10/1926 DOA: 02/07/2022 PCP: Sonny Masters, FNP   LOS: 6 days   Brief Narrative / Interim history: 88 year old male with HTN, BPH, HLD, CAD, comes into the hospital on 8/18 with aphasia, inability to walk, right-sided weakness and facial droop.  He was found to have a left acute MCA ischemic stroke with surrounding abnormalities and large vessel occlusion.  Case was discussed with neurology, and after talking with patient's son and given age, comorbidities the decision was made to treat the treatable, complete the work-up without any aggressive interventions.  Pertinent data: CT head 8/18-cytotoxic edema suspected in the left MCA territory, no hemorrhage CTA 8/18-acute left MCA ischemia, near occlusion of left ICA origin and bulb, age-indeterminate but acute appearing thrombus in the left side found with severe stenosis and multiple MCA branch occlusions. Brain MRI 8/18-acute infarct of the left insula, frontal operculum and high frontal lobe and MCA distribution CT head 8/18-hemorrhage or hemorrhagic conversion of the left MCA stroke Chest x-ray 8/20-no active disease 2D echo 8/19-EF 65-70%, no WMA, LVH, RV was normal  Subjective / 24h Interval events: -Motor aphasia persist -Son at bedside -Swallowing improving  Assesement and Plan: Principal Problem:   Acute ischemic stroke (HCC) Active Problems:   BPH (benign prostatic hyperplasia)   Leukocytosis   Pressure injury of skin   Essential hypertension   Glaucoma  Principal problem Acute CVA- on imaging, he was found to have acute left MCA stroke.  Neurology recommending dual antiplatelet therapy for 90 days.  Stroke work-up as above, including 2D echo, CT angiogram head and neck.  Lipid panel shows an LDL of 34 -Swallowing improving -Motor aphasia persist -Awaiting transfer to inpatient rehab for speech, PT and OT rehab  Active problems Glaucoma -Continue treatment with  Alphagan and Xalatan.   Essential hypertension -Mostly borderline and no using antihypertensive agents as an outpatient. Allowing permissive hypertension in the setting of acute ischemic stroke.   Pressure injury of skin -Stage I perineum/medial sacrum pressure injury appreciated at time of admission.   Leukocytosis -No signs of acute infection appreciated, most likely stress demargination -Repeat CBC in a.m. to follow WBCs trend/stability.   BPH (benign prostatic hyperplasia) -No complaints of urinary retention   Goals of care-palliative care consulted, treat the treatable, DNR/DNI.   - Scheduled Meds:  aspirin  300 mg Rectal Daily   atorvastatin  10 mg Oral q1800   brimonidine  1 drop Both Eyes TID   clopidogrel  75 mg Oral Daily   heparin  5,000 Units Subcutaneous Q8H   latanoprost  1 drop Both Eyes QPM   mouth rinse  15 mL Mouth Rinse 4 times per day   pantoprazole (PROTONIX) IV  40 mg Intravenous Q24H   Continuous Infusions: PRN Meds:.[DISCONTINUED] acetaminophen **OR** acetaminophen (TYLENOL) oral liquid 160 mg/5 mL **OR** acetaminophen, labetalol, mouth rinse, senna-docusate  Diet Orders (From admission, onward)     Start     Ordered   02/13/22 1300  DIET - DYS 1 Room service appropriate? Yes; Fluid consistency: Honey Thick  Diet effective now       Question Answer Comment  Room service appropriate? Yes   Fluid consistency: Honey Thick      02/13/22 1300            DVT prophylaxis: heparin injection 5,000 Units Start: 02/07/22 1400   Lab Results  Component Value Date   PLT 248 02/13/2022      Code Status: DNR  Family Communication: None at bedside  Status is: Inpatient Remains inpatient appropriate because: Rehab placement   Level of care: Telemetry  Consultants:  Neurology Palliative care  Objective: Vitals:   02/12/22 1406 02/12/22 1947 02/13/22 0511 02/13/22 1313  BP: (!) 155/66 (!) 163/83 (!) 162/71 (!) 159/71  Pulse: (!) 102 97 95 (!)  111  Resp: 16 20  18   Temp: 99.3 F (37.4 C) 98.8 F (37.1 C) 97.8 F (36.6 C) 100.3 F (37.9 C)  TempSrc: Oral Oral Oral Oral  SpO2: 96% 96% 97% 99%  Weight:      Height:        Intake/Output Summary (Last 24 hours) at 02/13/2022 1829 Last data filed at 02/13/2022 0500 Gross per 24 hour  Intake 360 ml  Output 800 ml  Net -440 ml   Wt Readings from Last 3 Encounters:  02/07/22 67.9 kg  01/14/22 72.5 kg  12/13/21 74.8 kg    Examination:  Constitutional: NAD Eyes: no scleral icterus ENMT: Mucous membranes are moist.  Neck: normal, supple Respiratory: clear to auscultation bilaterally, no wheezing, no crackles. Normal respiratory effort. No accessory muscle use.  Cardiovascular: Regular rate and rhythm, no murmurs / rubs / gallops. Abdomen: non distended, no tenderness. Bowel sounds positive.  Musculoskeletal: no clubbing / cyanosis.  Skin: no rashes Neuro-motor aphasia persist swallowing difficulties improving   Data Reviewed: I have independently reviewed following labs and imaging studies   CBC Recent Labs  Lab 02/07/22 0922 02/07/22 0931 02/10/22 0548 02/13/22 0611  WBC 11.8*  --  13.8* 10.4  HGB 12.9* 13.6 13.6 12.4*  HCT 38.7* 40.0 42.9 38.9*  PLT 235  --  205 248  MCV 97.0  --  100.5* 101.3*  MCH 32.3  --  31.9 32.3  MCHC 33.3  --  31.7 31.9  RDW 12.2  --  11.9 11.9  LYMPHSABS 1.6  --   --   --   MONOABS 0.9  --   --   --   EOSABS 0.1  --   --   --   BASOSABS 0.0  --   --   --     Recent Labs  Lab 02/07/22 0922 02/07/22 0931 02/08/22 0155 02/10/22 0548 02/13/22 0611  NA 138 138 136 138 143  K 3.6 3.7 3.6 3.6 3.3*  CL 103 100 103 105 106  CO2 25  --  22 22 27   GLUCOSE 148* 145* 108* 106* 120*  BUN 17 15 18  24* 38*  CREATININE 0.92 0.90 0.76 0.68 0.80  CALCIUM 8.9  --  8.5* 8.9 9.1  AST 23  --   --   --  58*  ALT 16  --   --   --  36  ALKPHOS 53  --   --   --  69  BILITOT 1.3*  --   --   --  1.2  ALBUMIN 3.9  --   --   --  3.1*  MG   --   --   --   --  2.1  INR 1.2  --   --   --   --   HGBA1C 5.4  --   --   --   --     ------------------------------------------------------------------------------------------------------------------ No results for input(s): "CHOL", "HDL", "LDLCALC", "TRIG", "CHOLHDL", "LDLDIRECT" in the last 72 hours.  Lab Results  Component Value Date   HGBA1C 5.4 02/07/2022   CBG: Recent Labs  Lab 02/12/22 2334 02/13/22 02/14/22 02/13/22 02/15/22  02/13/22 1138 02/13/22 1557  GLUCAP 117* 128* 109* 164* 141*    Recent Results (from the past 240 hour(s))  Resp Panel by RT-PCR (Flu A&B, Covid) Anterior Nasal Swab     Status: None   Collection Time: 02/07/22 11:19 AM   Specimen: Anterior Nasal Swab  Result Value Ref Range Status   SARS Coronavirus 2 by RT PCR NEGATIVE NEGATIVE Final    Comment: (NOTE) SARS-CoV-2 target nucleic acids are NOT DETECTED.  The SARS-CoV-2 RNA is generally detectable in upper respiratory specimens during the acute phase of infection. The lowest concentration of SARS-CoV-2 viral copies this assay can detect is 138 copies/mL. A negative result does not preclude SARS-Cov-2 infection and should not be used as the sole basis for treatment or other patient management decisions. A negative result may occur with  improper specimen collection/handling, submission of specimen other than nasopharyngeal swab, presence of viral mutation(s) within the areas targeted by this assay, and inadequate number of viral copies(<138 copies/mL). A negative result must be combined with clinical observations, patient history, and epidemiological information. The expected result is Negative.  Fact Sheet for Patients:  BloggerCourse.com  Fact Sheet for Healthcare Providers:  SeriousBroker.it  This test is no t yet approved or cleared by the [place] FDA and  has been authorized for detection and/or diagnosis of SARS-CoV-2 by FDA under an  Emergency Use Authorization (EUA). This EUA will remain  in effect (meaning this test can be used) for the duration of the COVID-19 declaration under Section 564(b)(1) of the Act, 21 U.S.C.section 360bbb-3(b)(1), unless the authorization is terminated  or revoked sooner.       Influenza A by PCR NEGATIVE NEGATIVE Final   Influenza B by PCR NEGATIVE NEGATIVE Final    Comment: (NOTE) The Xpert Xpress SARS-CoV-2/FLU/RSV plus assay is intended as an aid in the diagnosis of influenza from Nasopharyngeal swab specimens and should not be used as a sole basis for treatment. Nasal washings and aspirates are unacceptable for Xpert Xpress SARS-CoV-2/FLU/RSV testing.  Fact Sheet for Patients: BloggerCourse.com  Fact Sheet for Healthcare Providers: SeriousBroker.it  This test is not yet approved or cleared by the [place] FDA and has been authorized for detection and/or diagnosis of SARS-CoV-2 by FDA under an Emergency Use Authorization (EUA). This EUA will remain in effect (meaning this test can be used) for the duration of the COVID-19 declaration under Section 564(b)(1) of the Act, 21 U.S.C. section 360bbb-3(b)(1), unless the authorization is terminated or revoked.  Performed at [facility], [address], [place], [place] [address]   Culture, blood (Routine X 2) w Reflex to ID Panel     Status: None (Preliminary result)   Collection Time: 02/10/22 11:11 PM   Specimen: BLOOD LEFT HAND  Result Value Ref Range Status   Specimen Description BLOOD LEFT HAND  Final   Special Requests   Final    BOTTLES DRAWN AEROBIC AND ANAEROBIC Blood Culture adequate volume   Culture   Final    NO GROWTH 3 DAYS Performed at [facility], [address], [place], [place] [address]    Report Status PENDING  Incomplete  Culture, blood (Routine X 2) w Reflex to ID Panel     Status: None (Preliminary result)   Collection Time: 02/10/22 11:15 PM    Specimen: BLOOD RIGHT HAND  Result Value Ref Range Status   Specimen Description BLOOD RIGHT HAND  Final   Special Requests   Final    BOTTLES DRAWN AEROBIC AND  ANAEROBIC Blood Culture adequate volume   Culture   Final    NO GROWTH 3 DAYS Performed at [facility], [address] [place], [place] [address]    Report Status PENDING  Incomplete     Radiology Studies: DG Swallowing Func-Speech Pathology  Result Date: 02/13/2022 Table formatting from the original result was not included. Objective Swallowing Evaluation: Type of Study: MBS-Modified Barium Swallow Study  Patient Details Name: Vear ClockBobby L Bradshaw MRN: [id number] Date of Birth: 1926/04/04 Today's Date: 02/13/2022 Time: SLP Start Time (ACUTE ONLY): 1050 -SLP Stop Time (ACUTE ONLY): 1120 SLP Time Calculation (min) (ACUTE ONLY): 30 min Past Medical History: Past Medical History: Diagnosis Date  Aortic regurgitation   Moderate  Arthritis   BPH (benign prostatic hyperplasia)   Cervical disc disease   Cervical radiculopathy   Hyperlipidemia   Hypertension   Prediabetes 02/16/2021  Staphylococcus aureus bacteremia 08/09/2012  TEE negative for vegetation or thrombus February 2014  Stroke [facility](HCC) 2005 or 2006  3  Symptomatic carotid artery stenosis with infarction (HCC) 2005 or 2006  Status post right carotid endarterectomy Past Surgical History: Past Surgical History: Procedure Laterality Date  BACK SURGERY    CAROTID ENDARTERECTOMY    CATARACT EXTRACTION W/PHACO Left 02/15/2015  Procedure: CATARACT EXTRACTION PHACO AND INTRAOCULAR LENS PLACEMENT LEFT EYE CDE=30.93;  Surgeon: Gemma PayorKerry Hunt, MD;  Location: [facility] ORS;  Service: Ophthalmology;  Laterality: Left;  CHOLECYSTECTOMY    EYE SURGERY    KIDNEY STONE SURGERY    LUMBAR LAMINECTOMY/DECOMPRESSION MICRODISCECTOMY Bilateral 01/23/2014  Procedure: LUMBAR LAMINECTOMY/DECOMPRESSION MICRODISCECTOMY 1 LEVEL L5-S1;  Surgeon: Temple PaciniHenry A Pool, MD;  Location: [facility] NEURO ORS;  Service: Neurosurgery;  Laterality: Bilateral;  LUMBAR  LAMINECTOMY/DECOMPRESSION MICRODISCECTOMY 1 LEVEL L5-S1  TEE WITHOUT CARDIOVERSION N/A 08/12/2012  Procedure: TRANSESOPHAGEAL ECHOCARDIOGRAM (TEE);  Surgeon: Wendall StadePeter C Nishan, MD;  Location: [facility] ENDO SUITE;  Service: Cardiovascular;  Laterality: N/A;  TEE WITHOUT CARDIOVERSION N/A 06/24/2013  Procedure: TRANSESOPHAGEAL ECHOCARDIOGRAM (TEE);  Surgeon: Jonelle SidleSamuel G McDowell, MD;  Location: [facility] ENDO SUITE;  Service: Endoscopy;  Laterality: N/A; HPI: Vear ClockBobby L Wainer is a 88 y.o. male with medical history significant of hypertension, BPH, hyperlipidemia, history of carotid artery disease and chronic back pain with radiculopathy; who presented to the emergency department secondary to aphasia, inability to walk and right-sided weakness.  Patient was last seen normal prior to bed on 02/06/2022 (around 9:10 PM). noted to have MCA infarct and several occlusions on LVO. BSE and SLE ordred.  Subjective: Nonverbal  Recommendations for follow up therapy are one component of a multi-disciplinary discharge planning process, led by the attending physician.  Recommendations may be updated based on patient status, additional functional criteria and insurance authorization. Assessment / Plan / Recommendation   02/13/2022  12:00 PM Clinical Impressions Clinical Impression Pt presents with mod/severe oropharyngeal dysphagia in setting of dense right hemiparesis (with aphasia) with reduced ability to follow commands for compensation. Oral phase is marked by impaired lingual movement resulting in delayed oral transit, reduce bolus cohesiveness, premature spillage, piecemeal deglutition, and right buccal pocketing; Pharyngeal phase is marked by premature spillage with thins and nectars to the pyriforms with delay in swallow initiation, reduced tongue base retraction, epiglottic deflection, pharyngeal pressure and laryngeal closure resulting in penetration and aspiration in trace to mi/mod amount with thins after the swallow (occurred and was sensed x1  and unsensed x1 and not removed), and base of tongue, vallecular, and lateral channel residue with thins, NTL, HTL, and purees. Pt is at risk for aspiration and inability to  meet nutritional needs, however family  as expressed that feeding tubes are not within Pt's goals of care and would like to proceed with "safest diet" with least risk of aspiration. Recommend D1/puree and HTL via cup/tsp and hopeful progression to NTL at next venue. Pt is motivated and alert, but has difficulty swallowing and coughing on command. Encourage repeat swallows as able, lingual sweep, and assist from feeder to clear right buccal cavity. Recommend dysphagia and aphasia therapy in acute setting and at next venue. PO medications crushed as able in puree, feeder assist. Results of this study were reviewed with family. SLP Visit Diagnosis Dysphagia, oropharyngeal phase (R13.12) Impact on safety and function Moderate aspiration risk;Risk for inadequate nutrition/hydration     02/13/2022  12:00 PM Treatment Recommendations Treatment Recommendations Therapy as outlined in treatment plan below     02/13/2022  12:00 PM Prognosis Prognosis for Safe Diet Advancement Fair Barriers to Reach Goals Language deficits;Severity of deficits   02/13/2022  12:00 PM Diet Recommendations SLP Diet Recommendations Dysphagia 1 (Puree) solids;Honey thick liquids Liquid Administration via Cup;Spoon Medication Administration Crushed with puree Compensations Minimize environmental distractions;Slow rate;Lingual sweep for clearance of pocketing;Monitor for anterior loss;Multiple dry swallows after each bite/sip;Clear throat intermittently Postural Changes Remain semi-upright after after feeds/meals (Comment);Seated upright at 90 degrees     02/13/2022  12:00 PM Other Recommendations Oral Care Recommendations Oral care before and after PO;Staff/trained caregiver to provide oral care Other Recommendations Prohibited food (jello, ice cream, thin soups);Order thickener from  pharmacy;Clarify dietary restrictions Follow Up Recommendations Acute inpatient rehab (3hours/day) Assistance recommended at discharge Frequent or constant Supervision/Assistance Functional Status Assessment Patient has had a recent decline in their functional status and demonstrates the ability to make significant improvements in function in a reasonable and predictable amount of time.   02/13/2022  12:00 PM Frequency and Duration  Speech Therapy Frequency (ACUTE ONLY) min 2x/week Treatment Duration 1 week     02/13/2022  12:00 PM Oral Phase Oral Phase Impaired Oral - Honey Teaspoon Weak lingual manipulation;Incomplete tongue to palate contact;Reduced posterior propulsion;Lingual/palatal residue;Delayed oral transit;Decreased bolus cohesion Oral - Honey Cup Weak lingual manipulation;Incomplete tongue to palate contact;Reduced posterior propulsion;Lingual/palatal residue;Delayed oral transit;Decreased bolus cohesion Oral - Nectar Cup Weak lingual manipulation;Incomplete tongue to palate contact;Reduced posterior propulsion;Lingual/palatal residue;Delayed oral transit;Decreased bolus cohesion;Premature spillage Oral - Thin Teaspoon Weak lingual manipulation;Incomplete tongue to palate contact;Reduced posterior propulsion;Lingual/palatal residue;Delayed oral transit;Decreased bolus cohesion;Premature spillage Oral - Puree Weak lingual manipulation;Incomplete tongue to palate contact;Reduced posterior propulsion;Right pocketing in lateral sulci;Lingual/palatal residue;Piecemeal swallowing;Delayed oral transit;Decreased bolus cohesion    02/13/2022  12:00 PM Pharyngeal Phase Pharyngeal Phase Impaired Pharyngeal- Honey Teaspoon Delayed swallow initiation-vallecula;Reduced epiglottic inversion;Reduced tongue base retraction;Pharyngeal residue - valleculae;Lateral channel residue Pharyngeal- Honey Cup Delayed swallow initiation-vallecula;Reduced pharyngeal peristalsis;Reduced epiglottic inversion;Reduced tongue base  retraction;Pharyngeal residue - valleculae;Lateral channel residue Pharyngeal- Nectar Teaspoon Delayed swallow initiation-pyriform sinuses;Reduced pharyngeal peristalsis;Reduced epiglottic inversion;Reduced airway/laryngeal closure;Pharyngeal residue - valleculae;Lateral channel residue Pharyngeal Material enters airway, remains ABOVE vocal cords then ejected out Pharyngeal- Nectar Cup Delayed swallow initiation-pyriform sinuses;Reduced epiglottic inversion;Reduced airway/laryngeal closure;Reduced tongue base retraction;Penetration/Aspiration during swallow;Penetration/Apiration after swallow;Pharyngeal residue - valleculae;Lateral channel residue Pharyngeal Material enters airway, CONTACTS cords and then ejected out Pharyngeal- Thin Teaspoon Delayed swallow initiation-pyriform sinuses;Reduced pharyngeal peristalsis;Reduced epiglottic inversion;Reduced airway/laryngeal closure;Penetration/Apiration after swallow;Reduced tongue base retraction;Trace aspiration;Moderate aspiration;Pharyngeal residue - valleculae;Lateral channel residue Pharyngeal Material enters airway, passes BELOW cords without attempt by patient to eject out (silent aspiration) Pharyngeal- Puree Delayed swallow initiation-vallecula;Reduced pharyngeal peristalsis;Reduced epiglottic inversion;Reduced tongue base retraction;Penetration/Apiration after swallow;Pharyngeal  residue - valleculae Pharyngeal Material enters airway, remains ABOVE vocal cords then ejected out    02/13/2022  12:00 PM Cervical Esophageal Phase  Cervical Esophageal Phase [facility] Thank you, Havery Moros, CCC-SLP [phone] PORTER,DABNEY 02/13/2022, 1:00 PM                       Shon Hale, MD  [facility]  Between 7 am - 7 pm I am available, please contact me via Amion (for emergencies) or Securechat (non urgent messages)  Between 7 pm - 7 am I am not available, please contact night coverage MD/APP via Amion

## 2022-02-13 NOTE — Progress Notes (Signed)
Palliative: [name], [place], is sitting up in the [place] chair in his room.  He is resting comfortably, but wakes easily when I call his name and touch his shoulder.  He appears acutely/chronically ill.  He looks to be in better spirits today.  He is alert, but it is difficult to determine orientation because he is aphasic due to stroke.  He is able to answer yes and no questions with shakes of the head at times.  His son Paul Bradshaw and [name] girlfriend [name] are present across the hall in the waiting room.  Paul Bradshaw has shared with nursing staff that he wants [facility] to be full code.  Domonique and I talk about his speech therapy consult and diet recommendations.  Ask how he is doing and he motions so so with his left hand.  I asked him to move his right arm, but he is unable.  He is able to somewhat lift his right leg off the chair.  I shared that he will need some time and rehab.  In order to determine orientation, I ask Paul Bradshaw if he had a heart attack, but he does not answer.  When I ask if he had a stroke he nods affirmatively.  Ask [name] if he worsens, would he want life support, but he lifts his left arm in an "I do not know" type of gesture.  I reassure him.  Paul Bradshaw, and I talk about the plan.  Speech therapy conducted MBBS this morning, awaiting results.  Continue with goal for CIR intensive inpatient rehab.  We talk about time for outcomes.  We also talk about CODE STATUS, "treat the treatable, but allowing natural passing".  We talk about the realities of Nthony's current health conditions.  I share that as Willman continues to show improvements, it would be important to continue discussions with [place].  Unfortunately, at this point I cannot fully verify that [name] understands the severity of his condition and therefore we will lean on Paul Bradshaw for decision making.  [name] remains "treat the treatable, but allowing natural passing"/DNR at this time.  Conference with attending, CIR representative, speech therapy,  bedside nursing staff, transition of care team related to patient condition, needs, goals of care, disposition.   Plan:   At this point continue to treat the treatable but no CPR or intubation.  Agreeable to CIR rehab if qualified.  Time for outcomes.  50 minutes Lillia Carmel, NP Palliative medicine team Team phone [phone] Greater than 50% of this time was spent counseling and coordinating care related to the above assessment and plan.

## 2022-02-13 NOTE — Progress Notes (Signed)
   02/13/22 1313  Assess: MEWS Score  Temp 100.3 F (37.9 C)  BP (!) 159/71  MAP (mmHg) 98  Pulse Rate (!) 111  Resp 18  SpO2 99 %  Assess: MEWS Score  MEWS Temp 0  MEWS Systolic 0  MEWS Pulse 2  MEWS RR 0  MEWS LOC 0  MEWS Score 2  MEWS Score Color Yellow  Assess: if the MEWS score is Yellow or Red  Were vital signs taken at a resting state? Yes  Focused Assessment No change from prior assessment  Does the patient meet 2 or more of the SIRS criteria? No  Does the patient have a confirmed or suspected source of infection? No  MEWS guidelines implemented *See Row Information* Yes  Treat  MEWS Interventions Other (Comment) (informed MD for further orders)  Take Vital Signs  Increase Vital Sign Frequency  Yellow: Q 2hr X 2 then Q 4hr X 2, if remains yellow, continue Q 4hrs  Escalate  MEWS: Escalate Yellow: discuss with charge nurse/RN and consider discussing with provider and RRT  Notify: Charge Nurse/RN  Name of Charge Nurse/RN Notified Irving Burton  Date Charge Nurse/RN Notified 02/13/22  Time Charge Nurse/RN Notified 1345  Notify: Provider  Provider Name/Title Dr. Marisa Severin  Date Provider Notified 02/13/22  Time Provider Notified 1345  Method of Notification Page  Notification Reason Other (Comment) (vital sign cahnge-temp)  Provider response No new orders  Date of Provider Response 02/13/22  Assess: SIRS CRITERIA  SIRS Temperature  0  SIRS Pulse 1  SIRS Respirations  0  SIRS WBC 1  SIRS Score Sum  2

## 2022-02-14 DIAGNOSIS — I639 Cerebral infarction, unspecified: Secondary | ICD-10-CM | POA: Diagnosis not present

## 2022-02-14 LAB — GLUCOSE, CAPILLARY
Glucose-Capillary: 108 mg/dL — ABNORMAL HIGH (ref 70–99)
Glucose-Capillary: 111 mg/dL — ABNORMAL HIGH (ref 70–99)
Glucose-Capillary: 111 mg/dL — ABNORMAL HIGH (ref 70–99)
Glucose-Capillary: 122 mg/dL — ABNORMAL HIGH (ref 70–99)
Glucose-Capillary: 170 mg/dL — ABNORMAL HIGH (ref 70–99)
Glucose-Capillary: 219 mg/dL — ABNORMAL HIGH (ref 70–99)

## 2022-02-14 NOTE — Progress Notes (Signed)
Patient up to chair today , able to feed self at breakfast. This writer fed him for lunch as he spills food at times. Patient tolerating current diet. Patient transferred x 2 to bed.  Peri care provided by this writer and NT michelle , patient noted to have smear but no BM. Patch applied to sacral area for redness and patient lying on left side

## 2022-02-14 NOTE — Progress Notes (Signed)
Occupational Therapy Treatment Patient Details Name: Paul Bradshaw MRN: [id number] DOB: 1926-04-14 Today's Date: 02/14/2022   History of present illness Paul Bradshaw is a 88 y.o. male with medical history significant of hypertension, BPH, hyperlipidemia, history of carotid artery disease and chronic back pain with radiculopathy; who presented to the emergency department secondary to aphasia, inability to walk and right-sided weakness.  Patient was last seen normal prior to bed on 02/06/2022 (around 9:10 PM).   OT comments  Pt agreeable to OT and PT co-treatment today. Pt demonstrates significant improvements in mobility today. R UE is still flaccid but pt required moderate assistance to sit up in bed with improved strength. Pt was also able to transfer to chair with min to mod A and ambulate in the room using hemi-walker with mod A. Pt struggled to feed himself needing assist due to difficulty with gross motor coordination to bring food to his mouth. Pt shows progress in functional ability overall.    Recommendations for follow up therapy are one component of a multi-disciplinary discharge planning process, led by the attending physician.  Recommendations may be updated based on patient status, additional functional criteria and insurance authorization.    Follow Up Recommendations  Acute inpatient rehab (3hours/day)    Assistance Recommended at Discharge Frequent or constant Supervision/Assistance  Patient can return home with the following  A lot of help with walking and/or transfers;A lot of help with bathing/dressing/bathroom;Assistance with cooking/housework;Assistance with feeding;Direct supervision/assist for medications management;Assist for transportation;Help with stairs or ramp for entrance   Equipment Recommendations  None recommended by OT    Recommendations for Other Services      Precautions / Restrictions Precautions Precautions: Fall Restrictions Weight Bearing  Restrictions: No       Mobility Bed Mobility Overal bed mobility: Needs Assistance Bed Mobility: Supine to Sit     Supine to sit: Mod assist, HOB elevated     General bed mobility comments: Pt able to pull to sit with Min to mod A but more assist needed to scoot to EOB.    Transfers Overall transfer level: Needs assistance Equipment used: Hemi-walker Transfers: Sit to/from Stand, Bed to chair/wheelchair/BSC Sit to Stand: Mod assist, Min assist     Step pivot transfers: Mod assist     General transfer comment: Hemi-walker used with therapist's on either side. This OT supported pt's R UE simulating use of hemi-sling. Extneded time and labored movement observed.     Balance Overall balance assessment: Needs assistance Sitting-balance support: Single extremity supported, Feet supported Sitting balance-Leahy Scale: Good Sitting balance - Comments: good/fair seated EOB   Standing balance support: Bilateral upper extremity supported, During functional activity, Reliant on assistive device for balance Standing balance-Leahy Scale: Poor Standing balance comment: poor with AD                           ADL either performed or assessed with clinical judgement   ADL Overall ADL's : Needs assistance/impaired Eating/Feeding: Set up;Minimal assistance;Sitting Eating/Feeding Details (indicate cue type and reason): Pt struggled to feed himself if pieces where not very small as seen by missing his mouth to his R side.                                 Functional mobility during ADLs: Moderate assistance (hemi-walker) General ADL Comments: Pt able to ambulate to doorway with hemi-walker. Slow labored  movement and assist to keep R UE supported.      Cognition Arousal/Alertness: Awake/alert Behavior During Therapy: WFL for tasks assessed/performed, Flat affect Overall Cognitive Status: Impaired/Different from baseline                                  General Comments: Pt able to follow commands with verbal cuing, tactile cuing, and modeling.                           Pertinent Vitals/ Pain       Pain Assessment Pain Assessment: No/denies pain                                                          Frequency  Min 2X/week        Progress Toward Goals  OT Goals(current goals can now be found in the care plan section)  Progress towards OT goals: Progressing toward goals  Acute Rehab OT Goals OT Goal Formulation: Patient unable to participate in goal setting Time For Goal Achievement: 02/25/22 Potential to Achieve Goals: Fair ADL Goals Pt Will Perform Eating: with mod assist;sitting Pt Will Perform Grooming: with min assist;sitting Pt Will Perform Upper Body Dressing: with min assist;sitting Pt Will Perform Lower Body Dressing: with min assist;with adaptive equipment;with mod assist;sitting/lateral leans Pt Will Transfer to Toilet: with min assist;stand pivot transfer Pt Will Perform Toileting - Clothing Manipulation and hygiene: with min assist;sit to/from stand;with mod assist Pt/caregiver will Perform Home Exercise Program: Increased ROM;Increased strength;Right Upper extremity;With minimal assist Additional ADL Goal #1: Pt will demonstrate improved visual fields by interacting with appropriately with objects in R field of view with set up assist 50% of trials.  Plan Discharge plan remains appropriate    Co-evaluation    PT/OT/SLP Co-Evaluation/Treatment: Yes Reason for Co-Treatment: Complexity of the patient's impairments (multi-system involvement)   OT goals addressed during session: ADL's and self-care                          End of Session Equipment Utilized During Treatment: Gait belt (hemi-walker)  OT Visit Diagnosis: Unsteadiness on feet (R26.81);Other abnormalities of gait and mobility (R26.89);Muscle weakness (generalized) (M62.81);Cognitive communication  deficit (R41.841);Other symptoms and signs involving the nervous system (R29.898);Hemiplegia and hemiparesis Symptoms and signs involving cognitive functions: Cerebral infarction Hemiplegia - Right/Left: Right Hemiplegia - dominant/non-dominant: Dominant Hemiplegia - caused by: Cerebral infarction   Activity Tolerance Patient tolerated treatment well   Patient Left in chair;with call bell/phone within reach   Nurse Communication          Time: 8341-9622 OT Time Calculation (min): 27 min  Charges: OT General Charges $OT Visit: 1 Visit OT Treatments $Self Care/Home Management : 8-22 mins  [facility] OT, MOT  Danie Chandler 02/14/2022, 9:34 AM

## 2022-02-14 NOTE — Progress Notes (Signed)
Speech Language Pathology Treatment: Dysphagia  Patient Details Name: Paul Bradshaw MRN: [id number] DOB: [birth year] Today's Date: 02/14/2022 Time: 6433-2951 SLP Time Calculation (min) (ACUTE ONLY): 25 min  Assessment / Plan / Recommendation Clinical Impression  Ongoing diagnostic dysphagia treatment provided today; Pt was repositioned with help of NT. Pt prefers to self feed and after appropriate prep and support provided (loading spoon), Pt self fed puree with anterior spillage, difficulty getting utensil in oral cavity. Pt demonstrated prolonged oral prep and mild pocketing in R lateral pocket. Pt held cup of HTL and self presented without incident. Pt continues to be emotional and did generate some groaning on command with SLP during session. Pt is motivated and attempts to follow commands. Recommend continue with D1/puree and HTL. Provided support and education to family encouraging strategies re: lingual sweep, repeat swallow, alternate bites/sips. Continue to crush meds in puree. ST will follow.   HPI HPI: Paul Bradshaw is a 88 y.o. male with medical history significant of hypertension, BPH, hyperlipidemia, history of carotid artery disease and chronic back pain with radiculopathy; who presented to the emergency department secondary to aphasia, inability to walk and right-sided weakness.  Patient was last seen normal prior to bed on 02/06/2022 (around 9:10 PM). noted to have MCA infarct and several occlusions on LVO. BSE and SLE ordred.      SLP Plan  Continue with current plan of care      Recommendations for follow up therapy are one component of a multi-disciplinary discharge planning process, led by the attending physician.  Recommendations may be updated based on patient status, additional functional criteria and insurance authorization.    Recommendations  Diet recommendations: Dysphagia 1 (puree);Honey-thick liquid Liquids provided via: Cup;No straw Medication Administration:  Crushed with puree Supervision: Staff to assist with self feeding;Full supervision/cueing for compensatory strategies Compensations: Minimize environmental distractions;Slow rate;Lingual sweep for clearance of pocketing;Monitor for anterior loss;Multiple dry swallows after each bite/sip;Clear throat intermittently Postural Changes and/or Swallow Maneuvers: Seated upright 90 degrees;Upright 30-60 min after meal                Oral Care Recommendations: Oral care BID;Oral care before and after PO;Staff/trained caregiver to provide oral care Follow Up Recommendations: Acute inpatient rehab (3hours/day) Assistance recommended at discharge: Frequent or constant Supervision/Assistance SLP Visit Diagnosis: Dysphagia, oropharyngeal phase (R13.12) Plan: Continue with current plan of care          Amity Roes H. Romie Levee, CCC-SLP Speech Language Pathologist  Georgetta Haber  02/14/2022, 5:27 PM

## 2022-02-14 NOTE — Care Management Important Message (Signed)
Important Message  Patient Details  Name: Paul Bradshaw MRN: [id number] Date of Birth: 1925/09/17   Medicare Important Message Given:  Yes     Corey Harold 02/14/2022, 12:12 PM

## 2022-02-14 NOTE — Progress Notes (Signed)
Inpatient Rehabilitation Admissions Coordinator   I await updated PT and OT assessments to begin insurance Auth with [facility] medicare for  possible Cir admit next week. Once insurance has clinicals, typically takes 24 to 72  business hrs to obtain their decision.  Ottie Glazier, RN, MSN Rehab Admissions Coordinator [phone] 02/14/2022 8:17 AM

## 2022-02-14 NOTE — Progress Notes (Signed)
Inpatient Rehabilitation Admissions Coordinator   I have PT and OT notes and will begin Auth with [facility] medicare for possible Cir admit. Typically takes 24 to 72 hrs to get a determination. I will follow up on Monday.  Ottie Glazier, RN, MSN Rehab Admissions Coordinator [phone] 02/14/2022 10:08 AM

## 2022-02-14 NOTE — Progress Notes (Signed)
PROGRESS NOTE  Paul Bradshaw [id number] DOB: 1925/08/26 DOA: 02/07/2022 PCP: Baruch Gouty, FNP   LOS: 7 days   Brief Narrative / Interim history: 88 year old male with HTN, BPH, HLD, CAD, comes into the hospital on 8/18 with aphasia, inability to walk, right-sided weakness and facial droop.  He was found to have a left acute MCA ischemic stroke with surrounding abnormalities and large vessel occlusion.  Case was discussed with neurology, and after talking with patient's son and given age, comorbidities the decision was made to treat the treatable, complete the work-up without any aggressive interventions.  Pertinent data: CT head 8/18-cytotoxic edema suspected in the left MCA territory, no hemorrhage CTA 8/18-acute left MCA ischemia, near occlusion of left ICA origin and bulb, age-indeterminate but acute appearing thrombus in the left side found with severe stenosis and multiple MCA branch occlusions. Brain MRI 8/18-acute infarct of the left insula, frontal operculum and high frontal lobe and MCA distribution CT head 8/18-hemorrhage or hemorrhagic conversion of the left MCA stroke Chest x-ray 8/20-no active disease 2D echo 8/19-EF 65-70%, no WMA, LVH, RV was normal  Subjective / 24h Interval events: -Motor aphasia persist -Son at bedside -Swallowing improving  Assesement and Plan: Principal Problem:   Acute ischemic stroke ([place]) Active Problems:   BPH (benign prostatic hyperplasia)   Leukocytosis   Pressure injury of skin   Essential hypertension   Glaucoma  Principal problem Lt MCA Acute CVA-with Broca's aphasia -  -Neurology consult appreciated  -Continue aspirin 81 mg daily along with Plavix 75 mg daily for 90 days then after that STOP the Plavix  and continue ONLY Aspirin 81 mg daily indefinitely--for secondary stroke Prevention (Per The multicenter SAMMPRIS trial) Stroke work-up as above, including 2D echo, CT angiogram head and neck.  Lipid panel shows an LDL of  34 -Swallowing improving -Motor aphasia persist -Awaiting transfer to inpatient rehab for speech, PT and OT rehab  Active problems Glaucoma -Continue treatment with Alphagan and Xalatan.   Essential hypertension -Mostly borderline and no using antihypertensive agents as an outpatient. Allowing permissive hypertension in the setting of acute ischemic stroke.   Pressure injury of skin -Stage I perineum/medial sacrum pressure injury appreciated at time of admission.   Leukocytosis -No signs of acute infection appreciated, most likely stress demargination -Leukocytosis resolved   BPH (benign prostatic hyperplasia) -No complaints of urinary retention   Goals of care-palliative care consulted, treat the treatable, DNR/DNI.   - Scheduled Meds:  aspirin  300 mg Rectal Daily   atorvastatin  10 mg Oral q1800   brimonidine  1 drop Both Eyes TID   clopidogrel  75 mg Oral Daily   heparin  5,000 Units Subcutaneous Q8H   latanoprost  1 drop Both Eyes QPM   mouth rinse  15 mL Mouth Rinse 4 times per day   pantoprazole (PROTONIX) IV  40 mg Intravenous Q24H   Continuous Infusions: PRN Meds:.[DISCONTINUED] acetaminophen **OR** acetaminophen (TYLENOL) oral liquid 160 mg/5 mL **OR** acetaminophen, labetalol, mouth rinse, senna-docusate  Diet Orders (From admission, onward)     Start     Ordered   02/13/22 1300  DIET - DYS 1 Room service appropriate? Yes; Fluid consistency: Honey Thick  Diet effective now       Question Answer Comment  Room service appropriate? Yes   Fluid consistency: Honey Thick      02/13/22 1300            DVT prophylaxis: heparin injection 5,000 Units Start: 02/07/22 1400  Lab Results  Component Value Date   PLT 248 02/13/2022      Code Status: DNR  Family Communication: Discussed with son and daughter-in-law  Status is: Inpatient Remains inpatient appropriate because: Rehab placement  Level of care: Telemetry  Consultants:  Neurology Palliative  care  Objective: Vitals:   02/13/22 2011 02/14/22 0600 02/14/22 0624 02/14/22 1400  BP: (!) 167/84 (!) 203/103 (!) 180/84 125/67  Pulse: 100 81  96  Resp: 20 20  18   Temp: 99.4 F (37.4 C) 98.7 F (37.1 C)  99.3 F (37.4 C)  TempSrc:      SpO2: 97% 99%  96%  Weight:      Height:        Intake/Output Summary (Last 24 hours) at 02/14/2022 2013 Last data filed at 02/14/2022 0800 Gross per 24 hour  Intake --  Output 601 ml  Net -601 ml   Wt Readings from Last 3 Encounters:  02/07/22 67.9 kg  01/14/22 72.5 kg  12/13/21 74.8 kg    Examination:  Constitutional: NAD Eyes: no scleral icterus ENMT: Mucous membranes are moist.  Neck: normal, supple Respiratory: clear to auscultation bilaterally, no wheezing, no crackles. Normal respiratory effort. No accessory muscle use.  Cardiovascular: Regular rate and rhythm, no murmurs / rubs / gallops. Abdomen: non distended, no tenderness. Bowel sounds positive.  Musculoskeletal: no clubbing / cyanosis.  Skin: no rashes Neuro-facial asymmetry, motor aphasia persist swallowing difficulties improving   Data Reviewed: I have independently reviewed following labs and imaging studies   CBC Recent Labs  Lab 02/10/22 0548 02/13/22 0611  WBC 13.8* 10.4  HGB 13.6 12.4*  HCT 42.9 38.9*  PLT 205 248  MCV 100.5* 101.3*  MCH 31.9 32.3  MCHC 31.7 31.9  RDW 11.9 11.9    Recent Labs  Lab 02/08/22 0155 02/10/22 0548 02/13/22 0611  NA 136 138 143  K 3.6 3.6 3.3*  CL 103 105 106  CO2 22 22 27   GLUCOSE 108* 106* 120*  BUN 18 24* 38*  CREATININE 0.76 0.68 0.80  CALCIUM 8.5* 8.9 9.1  AST  --   --  58*  ALT  --   --  36  ALKPHOS  --   --  69  BILITOT  --   --  1.2  ALBUMIN  --   --  3.1*  MG  --   --  2.1    Lab Results  Component Value Date   HGBA1C 5.4 02/07/2022   CBG: Recent Labs  Lab 02/14/22 0050 02/14/22 0555 02/14/22 0740 02/14/22 1114 02/14/22 1709  GLUCAP 111* 111* 108* 170* 122*    Recent Results (from  the past 240 hour(s))  Resp Panel by RT-PCR (Flu A&B, Covid) Anterior Nasal Swab     Status: None   Collection Time: 02/07/22 11:19 AM   Specimen: Anterior Nasal Swab  Result Value Ref Range Status   SARS Coronavirus 2 by RT PCR NEGATIVE NEGATIVE Final    Comment: (NOTE) SARS-CoV-2 target nucleic acids are NOT DETECTED.  The SARS-CoV-2 RNA is generally detectable in upper respiratory specimens during the acute phase of infection. The lowest concentration of SARS-CoV-2 viral copies this assay can detect is 138 copies/mL. A negative result does not preclude SARS-Cov-2 infection and should not be used as the sole basis for treatment or other patient management decisions. A negative result may occur with  improper specimen collection/handling, submission of specimen other than nasopharyngeal swab, presence of viral mutation(s) within the areas targeted by  this assay, and inadequate number of viral copies(<138 copies/mL). A negative result must be combined with clinical observations, patient history, and epidemiological information. The expected result is Negative.  Fact Sheet for Patients:  BloggerCourse.com  Fact Sheet for Healthcare Providers:  SeriousBroker.it  This test is no t yet approved or cleared by the [place] FDA and  has been authorized for detection and/or diagnosis of SARS-CoV-2 by FDA under an Emergency Use Authorization (EUA). This EUA will remain  in effect (meaning this test can be used) for the duration of the COVID-19 declaration under Section 564(b)(1) of the Act, 21 U.S.C.section 360bbb-3(b)(1), unless the authorization is terminated  or revoked sooner.       Influenza A by PCR NEGATIVE NEGATIVE Final   Influenza B by PCR NEGATIVE NEGATIVE Final    Comment: (NOTE) The Xpert Xpress SARS-CoV-2/FLU/RSV plus assay is intended as an aid in the diagnosis of influenza from Nasopharyngeal swab specimens  and should not be used as a sole basis for treatment. Nasal washings and aspirates are unacceptable for Xpert Xpress SARS-CoV-2/FLU/RSV testing.  Fact Sheet for Patients: BloggerCourse.com  Fact Sheet for Healthcare Providers: SeriousBroker.it  This test is not yet approved or cleared by the [place] FDA and has been authorized for detection and/or diagnosis of SARS-CoV-2 by FDA under an Emergency Use Authorization (EUA). This EUA will remain in effect (meaning this test can be used) for the duration of the COVID-19 declaration under Section 564(b)(1) of the Act, 21 U.S.C. section 360bbb-3(b)(1), unless the authorization is terminated or revoked.  Performed at [facility], [address], [place], [place] [address]   Culture, blood (Routine X 2) w Reflex to ID Panel     Status: None (Preliminary result)   Collection Time: 02/10/22 11:11 PM   Specimen: BLOOD LEFT HAND  Result Value Ref Range Status   Specimen Description BLOOD LEFT HAND  Final   Special Requests   Final    BOTTLES DRAWN AEROBIC AND ANAEROBIC Blood Culture adequate volume   Culture   Final    NO GROWTH 4 DAYS Performed at [facility], [address], [place], [place] [address]    Report Status PENDING  Incomplete  Culture, blood (Routine X 2) w Reflex to ID Panel     Status: None (Preliminary result)   Collection Time: 02/10/22 11:15 PM   Specimen: BLOOD RIGHT HAND  Result Value Ref Range Status   Specimen Description BLOOD RIGHT HAND  Final   Special Requests   Final    BOTTLES DRAWN AEROBIC AND ANAEROBIC Blood Culture adequate volume   Culture   Final    NO GROWTH 4 DAYS Performed at [facility], [address], [place], [place] [address]    Report Status PENDING  Incomplete    Shon Hale, MD  [facility]  Between 7 am - 7 pm I am available, please contact me via Amion (for emergencies) or Securechat (non urgent messages)  Between 7  pm - 7 am I am not available, please contact night coverage MD/APP via Amion

## 2022-02-14 NOTE — Progress Notes (Signed)
Physical Therapy Treatment Patient Details Name: Paul Bradshaw MRN: [id number] DOB: [birth year] Today's Date: 02/14/2022   History of Present Illness Paul Bradshaw is a 88 y.o. male with medical history significant of hypertension, BPH, hyperlipidemia, history of carotid artery disease and chronic back pain with radiculopathy; who presented to the emergency department secondary to aphasia, inability to walk and right-sided weakness.  Patient was last seen normal prior to bed on 02/06/2022 (around 9:10 PM).    PT Comments    Patient demonstrating improving mobility today with decreased assist required. Patient requires assist to pull to seated EOB along with cueing and assist for scooting. He demonstrates good sitting tolerance and sitting balance EOB. Patient requires assist and use of hemiwalker to transfer to standing and ambulate several steps at bedside to chair. Patient takes a short rest break due to fatigue but is then able to ambulate increased distance with hemiwalker and assist. Patient set up in chair at end of session. Patient will benefit from continued skilled physical therapy in hospital and recommended venue below to increase strength, balance, endurance for safe ADLs and gait.    Recommendations for follow up therapy are one component of a multi-disciplinary discharge planning process, led by the attending physician.  Recommendations may be updated based on patient status, additional functional criteria and insurance authorization.  Follow Up Recommendations  Acute inpatient rehab (3hours/day)     Assistance Recommended at Discharge Frequent or constant Supervision/Assistance  Patient can return home with the following A lot of help with walking and/or transfers;A lot of help with bathing/dressing/bathroom   Equipment Recommendations  None recommended by PT    Recommendations for Other Services OT consult;Speech consult     Precautions / Restrictions  Precautions Precautions: Fall Restrictions Weight Bearing Restrictions: No     Mobility  Bed Mobility Overal bed mobility: Needs Assistance Bed Mobility: Supine to Sit     Supine to sit: Mod assist, HOB elevated     General bed mobility comments: Pt able to pull to sit with Min to mod A but more assist needed to scoot to EOB.    Transfers Overall transfer level: Needs assistance Equipment used: Hemi-walker Transfers: Sit to/from Stand, Bed to chair/wheelchair/BSC Sit to Stand: Mod assist, Min assist   Step pivot transfers: Mod assist       General transfer comment: Hemi-walker used with therapist's on either side. min/mod assist to power up to standing, cueing and demonstration of proper hemiwalker use    Ambulation/Gait Ambulation/Gait assistance: Min assist, Mod assist Gait Distance (Feet): 20 Feet Assistive device: Hemi-walker Gait Pattern/deviations: Step-to pattern, Trunk flexed Gait velocity: decreased     General Gait Details: slow, labored steps from bed to chair initially; after a short seated rest break patient able to ambulate increased distance with use of hemiwalker and assist for balance, gait speed improves with time   Stairs             Wheelchair Mobility    Modified Rankin (Stroke Patients Only)       Balance Overall balance assessment: Needs assistance Sitting-balance support: Single extremity supported, Feet supported Sitting balance-Leahy Scale: Good Sitting balance - Comments: good/fair seated EOB   Standing balance support: During functional activity, Reliant on assistive device for balance, Single extremity supported Standing balance-Leahy Scale: Poor Standing balance comment: fair/poor with hemiwalker  Cognition Arousal/Alertness: Awake/alert Behavior During Therapy: WFL for tasks assessed/performed, Flat affect Overall Cognitive Status: Impaired/Different from baseline                                  General Comments: Pt able to follow commands with verbal cuing, tactile cuing, and modeling.        Exercises      General Comments        Pertinent Vitals/Pain Pain Assessment Pain Assessment: No/denies pain    Home Living                          Prior Function            PT Goals (current goals can now be found in the care plan section) Acute Rehab PT Goals Patient Stated Goal: none stated PT Goal Formulation: With patient Time For Goal Achievement: 03/01/22 Potential to Achieve Goals: Fair Progress towards PT goals: Progressing toward goals    Frequency    Min 3X/week      PT Plan Current plan remains appropriate    Co-evaluation PT/OT/SLP Co-Evaluation/Treatment: Yes Reason for Co-Treatment: Complexity of the patient's impairments (multi-system involvement);To address functional/ADL transfers PT goals addressed during session: Mobility/safety with mobility;Balance;Proper use of DME;Strengthening/ROM OT goals addressed during session: ADL's and self-care      AM-PAC PT "6 Clicks" Mobility   Outcome Measure  Help needed turning from your back to your side while in a flat bed without using bedrails?: A Little Help needed moving from lying on your back to sitting on the side of a flat bed without using bedrails?: A Lot Help needed moving to and from a bed to a chair (including a wheelchair)?: A Lot Help needed standing up from a chair using your arms (e.g., wheelchair or bedside chair)?: A Lot Help needed to walk in hospital room?: A Lot Help needed climbing 3-5 steps with a railing? : A Lot 6 Click Score: 13    End of Session Equipment Utilized During Treatment: Gait belt Activity Tolerance: Patient tolerated treatment well;Patient limited by fatigue Patient left: with call bell/phone within reach;in chair Nurse Communication: Mobility status PT Visit Diagnosis: Unsteadiness on feet (R26.81);Other  abnormalities of gait and mobility (R26.89);Muscle weakness (generalized) (M62.81);Other symptoms and signs involving the nervous system (B35.329)     Time: 9242-6834 PT Time Calculation (min) (ACUTE ONLY): 28 min  Charges:  $Therapeutic Activity: 8-22 mins                     9:55 AM, 02/14/22 Wyman Songster PT, DPT Physical Therapist at [facility]

## 2022-02-15 DIAGNOSIS — I639 Cerebral infarction, unspecified: Secondary | ICD-10-CM | POA: Diagnosis not present

## 2022-02-15 LAB — GLUCOSE, CAPILLARY
Glucose-Capillary: 112 mg/dL — ABNORMAL HIGH (ref 70–99)
Glucose-Capillary: 117 mg/dL — ABNORMAL HIGH (ref 70–99)
Glucose-Capillary: 118 mg/dL — ABNORMAL HIGH (ref 70–99)
Glucose-Capillary: 134 mg/dL — ABNORMAL HIGH (ref 70–99)
Glucose-Capillary: 150 mg/dL — ABNORMAL HIGH (ref 70–99)
Glucose-Capillary: 152 mg/dL — ABNORMAL HIGH (ref 70–99)

## 2022-02-15 LAB — CULTURE, BLOOD (ROUTINE X 2)
Culture: NO GROWTH
Culture: NO GROWTH
Special Requests: ADEQUATE
Special Requests: ADEQUATE

## 2022-02-15 MED ORDER — ASPIRIN 325 MG PO TABS
325.0000 mg | ORAL_TABLET | Freq: Every day | ORAL | Status: DC
  Administered 2022-02-15: 325 mg via ORAL
  Filled 2022-02-15: qty 1

## 2022-02-15 MED ORDER — METOPROLOL TARTRATE 25 MG PO TABS
12.5000 mg | ORAL_TABLET | Freq: Two times a day (BID) | ORAL | Status: DC
  Administered 2022-02-15 – 2022-02-16 (×2): 12.5 mg via ORAL
  Filled 2022-02-15 (×2): qty 1

## 2022-02-15 MED ORDER — ASPIRIN 81 MG PO TBEC
81.0000 mg | DELAYED_RELEASE_TABLET | Freq: Every day | ORAL | Status: DC
  Administered 2022-02-16 – 2022-02-17 (×2): 81 mg via ORAL
  Filled 2022-02-15 (×2): qty 1

## 2022-02-15 NOTE — Progress Notes (Addendum)
PROGRESS NOTE  Paul Bradshaw XKG:[id number] DOB: 03-20-1926 DOA: 02/07/2022 PCP: Sonny Masters, FNP   LOS: 8 days   Brief Narrative / Interim history: 88 year old male with HTN, BPH, HLD, CAD, comes into the hospital on 8/18 with aphasia, inability to walk, right-sided weakness and facial droop.  He was found to have a left acute MCA ischemic stroke with surrounding abnormalities and large vessel occlusion.  Case was discussed with neurology, and after talking with patient's son and given age, comorbidities the decision was made to treat the treatable, complete the work-up without any aggressive interventions.  Pertinent data: CT head 8/18-cytotoxic edema suspected in the left MCA territory, no hemorrhage CTA 8/18-acute left MCA ischemia, near occlusion of left ICA origin and bulb, age-indeterminate but acute appearing thrombus in the left side found with severe stenosis and multiple MCA branch occlusions. Brain MRI 8/18-acute infarct of the left insula, frontal operculum and high frontal lobe and MCA distribution CT head 8/18-hemorrhage or hemorrhagic conversion of the left MCA stroke Chest x-ray 8/20-no active disease 2D echo 8/19-EF 65-70%, no WMA, LVH, RV was normal  Subjective / 24h Interval events: -No new complaints -Speech difficulties persist  Assesement and Plan: Principal Problem:   Acute ischemic stroke (HCC) Active Problems:   BPH (benign prostatic hyperplasia)   Leukocytosis   Pressure injury of skin   Essential hypertension   Glaucoma  Principal problem Lt MCA Acute CVA-with Broca's aphasia -  -Neurology consult appreciated  -Continue aspirin 81 mg daily along with Plavix 75 mg daily for 90 days then after that STOP the Plavix  and continue ONLY Aspirin 81 mg daily indefinitely--for secondary stroke Prevention (Per The multicenter SAMMPRIS trial) Stroke work-up as above, including 2D echo, CT angiogram head and neck.  Lipid panel shows an LDL of 34 -Swallowing  improving -Right upper extremity weakness and motor aphasia persist 02/15/22 -Remains medically stable for transfer to inpatient rehab when insurance approval is received, awaiting transfer to inpatient rehab for speech, PT and OT rehab  Active problems Glaucoma -Continue treatment with Alphagan and Xalatan.   Essential hypertension -initially allowed permissive hypertension -Okay to start metoprolol 12.5 mg twice daily especially given tachyarrhythmia/SVT  Pressure injury of skin -Stage I perineum/medial sacrum pressure injury appreciated at time of admission.   Leukocytosis -No signs of acute infection appreciated, most likely stress demargination -Leukocytosis resolved   BPH (benign prostatic hyperplasia) -No complaints of urinary retention   Goals of care-palliative care consulted,  DNR/DNI without limitations of care otherwise - Scheduled Meds:  [START ON 02/16/2022] aspirin EC  81 mg Oral Q breakfast   atorvastatin  10 mg Oral q1800   brimonidine  1 drop Both Eyes TID   clopidogrel  75 mg Oral Daily   heparin  5,000 Units Subcutaneous Q8H   latanoprost  1 drop Both Eyes QPM   metoprolol tartrate  12.5 mg Oral BID   mouth rinse  15 mL Mouth Rinse 4 times per day   pantoprazole (PROTONIX) IV  40 mg Intravenous Q24H   Continuous Infusions: PRN Meds:.[DISCONTINUED] acetaminophen **OR** acetaminophen (TYLENOL) oral liquid 160 mg/5 mL **OR** acetaminophen, labetalol, mouth rinse, senna-docusate  Diet Orders (From admission, onward)     Start     Ordered   02/13/22 1300  DIET - DYS 1 Room service appropriate? Yes; Fluid consistency: Honey Thick  Diet effective now       Question Answer Comment  Room service appropriate? Yes   Fluid consistency: Honey Thick  02/13/22 1300            DVT prophylaxis: heparin injection 5,000 Units Start: 02/07/22 1400   Lab Results  Component Value Date   PLT 248 02/13/2022      Code Status: DNR  Disposition: -Remains  medically stable for transfer to inpatient rehab when insurance approval is received, awaiting transfer to inpatient rehab for speech, PT and OT rehab  Family Communication: Discussed with son and daughter-in-law  Status is: Inpatient Remains inpatient appropriate because: Rehab placement  Level of care: Telemetry  Consultants:  Neurology Palliative care  Objective: Vitals:   02/14/22 1400 02/14/22 2050 02/15/22 0455 02/15/22 1234  BP: 125/67 134/60 (!) 149/73 130/83  Pulse: 96 (!) 102 81 (!) 110  Resp: 18 18 16 19   Temp: 99.3 F (37.4 C) 97.8 F (36.6 C) (!) 97.5 F (36.4 C) 98.6 F (37 C)  TempSrc:  Oral Oral   SpO2: 96% 96% 98% 95%  Weight:      Height:        Intake/Output Summary (Last 24 hours) at 02/15/2022 1648 Last data filed at 02/15/2022 1300 Gross per 24 hour  Intake 360 ml  Output 500 ml  Net -140 ml   Wt Readings from Last 3 Encounters:  02/07/22 67.9 kg  01/14/22 72.5 kg  12/13/21 74.8 kg   Physical Exam  Gen:- Awake Alert, in no acute distress  HEENT:- [place].AT, No sclera icterus, facial symmetry Neck-Supple Neck,No JVD,.  Lungs-  CTAB , fair air movement bilaterally  CV- S1, S2 normal, RRR Abd-  +ve B.Sounds, Abd Soft, No tenderness,    Extremity/Skin:- No  edema,   good pedal pulses  Psych-affect is appropriate, oriented x3 Neuro-facial asymmetry, motor aphasia persist, swallowing difficulties improving   Data Reviewed: I have independently reviewed following labs and imaging studies   CBC Recent Labs  Lab 02/10/22 0548 02/13/22 0611  WBC 13.8* 10.4  HGB 13.6 12.4*  HCT 42.9 38.9*  PLT 205 248  MCV 100.5* 101.3*  MCH 31.9 32.3  MCHC 31.7 31.9  RDW 11.9 11.9    Recent Labs  Lab 02/10/22 0548 02/13/22 0611  NA 138 143  K 3.6 3.3*  CL 105 106  CO2 22 27  GLUCOSE 106* 120*  BUN 24* 38*  CREATININE 0.68 0.80  CALCIUM 8.9 9.1  AST  --  58*  ALT  --  36  ALKPHOS  --  69  BILITOT  --  1.2  ALBUMIN  --  3.1*  MG  --  2.1     Lab Results  Component Value Date   HGBA1C 5.4 02/07/2022   CBG: Recent Labs  Lab 02/14/22 2359 02/15/22 0436 02/15/22 0713 02/15/22 1105 02/15/22 1624  GLUCAP 112* 117* 118* 134* 150*    Recent Results (from the past 240 hour(s))  Resp Panel by RT-PCR (Flu A&B, Covid) Anterior Nasal Swab     Status: None   Collection Time: 02/07/22 11:19 AM   Specimen: Anterior Nasal Swab  Result Value Ref Range Status   SARS Coronavirus 2 by RT PCR NEGATIVE NEGATIVE Final    Comment: (NOTE) SARS-CoV-2 target nucleic acids are NOT DETECTED.  The SARS-CoV-2 RNA is generally detectable in upper respiratory specimens during the acute phase of infection. The lowest concentration of SARS-CoV-2 viral copies this assay can detect is 138 copies/mL. A negative result does not preclude SARS-Cov-2 infection and should not be used as the sole basis for treatment or other patient management decisions. A  negative result may occur with  improper specimen collection/handling, submission of specimen other than nasopharyngeal swab, presence of viral mutation(s) within the areas targeted by this assay, and inadequate number of viral copies(<138 copies/mL). A negative result must be combined with clinical observations, patient history, and epidemiological information. The expected result is Negative.  Fact Sheet for Patients:  BloggerCourse.com  Fact Sheet for Healthcare Providers:  SeriousBroker.it  This test is no t yet approved or cleared by the [place] FDA and  has been authorized for detection and/or diagnosis of SARS-CoV-2 by FDA under an Emergency Use Authorization (EUA). This EUA will remain  in effect (meaning this test can be used) for the duration of the COVID-19 declaration under Section 564(b)(1) of the Act, 21 U.S.C.section 360bbb-3(b)(1), unless the authorization is terminated  or revoked sooner.       Influenza A by PCR  NEGATIVE NEGATIVE Final   Influenza B by PCR NEGATIVE NEGATIVE Final    Comment: (NOTE) The Xpert Xpress SARS-CoV-2/FLU/RSV plus assay is intended as an aid in the diagnosis of influenza from Nasopharyngeal swab specimens and should not be used as a sole basis for treatment. Nasal washings and aspirates are unacceptable for Xpert Xpress SARS-CoV-2/FLU/RSV testing.  Fact Sheet for Patients: BloggerCourse.com  Fact Sheet for Healthcare Providers: SeriousBroker.it  This test is not yet approved or cleared by the [place] FDA and has been authorized for detection and/or diagnosis of SARS-CoV-2 by FDA under an Emergency Use Authorization (EUA). This EUA will remain in effect (meaning this test can be used) for the duration of the COVID-19 declaration under Section 564(b)(1) of the Act, 21 U.S.C. section 360bbb-3(b)(1), unless the authorization is terminated or revoked.  Performed at [facility], [address], [place], [place] [address]   Culture, blood (Routine X 2) w Reflex to ID Panel     Status: None (Preliminary result)   Collection Time: 02/10/22 11:11 PM   Specimen: BLOOD LEFT HAND  Result Value Ref Range Status   Specimen Description BLOOD LEFT HAND  Final   Special Requests   Final    BOTTLES DRAWN AEROBIC AND ANAEROBIC Blood Culture adequate volume   Culture   Final    NO GROWTH 4 DAYS Performed at [facility], [address], [place], [place] [address]    Report Status PENDING  Incomplete  Culture, blood (Routine X 2) w Reflex to ID Panel     Status: None (Preliminary result)   Collection Time: 02/10/22 11:15 PM   Specimen: BLOOD RIGHT HAND  Result Value Ref Range Status   Specimen Description BLOOD RIGHT HAND  Final   Special Requests   Final    BOTTLES DRAWN AEROBIC AND ANAEROBIC Blood Culture adequate volume   Culture   Final    NO GROWTH 4 DAYS Performed at [facility], [address], [place], [place]  [address]    Report Status PENDING  Incomplete    Shon Hale, MD  [facility]  Between 7 am - 7 pm I am available, please contact me via Amion (for emergencies) or Securechat (non urgent messages)  Between 7 pm - 7 am I am not available, please contact night coverage MD/APP via Amion

## 2022-02-15 NOTE — Progress Notes (Signed)
Telemetry called, reported Pt had 28 beat run of SVT, HR 170. Currently back in SR, HR 80. DR. Adefeso notified. NNO at this time

## 2022-02-16 DIAGNOSIS — I639 Cerebral infarction, unspecified: Secondary | ICD-10-CM | POA: Diagnosis not present

## 2022-02-16 LAB — GLUCOSE, CAPILLARY
Glucose-Capillary: 109 mg/dL — ABNORMAL HIGH (ref 70–99)
Glucose-Capillary: 133 mg/dL — ABNORMAL HIGH (ref 70–99)
Glucose-Capillary: 142 mg/dL — ABNORMAL HIGH (ref 70–99)
Glucose-Capillary: 101 mg/dL — ABNORMAL HIGH (ref 70–99)

## 2022-02-16 LAB — BASIC METABOLIC PANEL
Anion gap: 8 (ref 5–15)
BUN: 37 mg/dL — ABNORMAL HIGH (ref 8–23)
CO2: 30 mmol/L (ref 22–32)
Calcium: 9.1 mg/dL (ref 8.9–10.3)
Chloride: 110 mmol/L (ref 98–111)
Creatinine, Ser: 0.83 mg/dL (ref 0.61–1.24)
GFR, Estimated: >60 mL/min (ref >60)
Glucose, Bld: 111 mg/dL — ABNORMAL HIGH (ref 70–99)
Potassium: 3.9 mmol/L (ref 3.5–5.1)
Sodium: 148 mmol/L — ABNORMAL HIGH (ref 135–145)

## 2022-02-16 LAB — CBC
HCT: 40.8 % (ref 39.0–52.0)
Hemoglobin: 12.9 g/dL — ABNORMAL LOW (ref 13.0–17.0)
MCH: 32.2 pg (ref 26.0–34.0)
MCHC: 31.6 g/dL (ref 30.0–36.0)
MCV: 101.7 fL — ABNORMAL HIGH (ref 80.0–100.0)
Platelets: 276 10*3/uL (ref 150–400)
RBC: 4.01 MIL/uL — ABNORMAL LOW (ref 4.22–5.81)
RDW: 12 % (ref 11.5–15.5)
WBC: 10.2 10*3/uL (ref 4.0–10.5)
nRBC: 0 % (ref 0.0–0.2)

## 2022-02-16 MED ORDER — METOPROLOL TARTRATE 25 MG PO TABS
25.0000 mg | ORAL_TABLET | Freq: Two times a day (BID) | ORAL | Status: DC
  Administered 2022-02-16 – 2022-02-17 (×2): 25 mg via ORAL
  Filled 2022-02-16 (×2): qty 1

## 2022-02-16 MED ORDER — SODIUM CHLORIDE 0.45 % IV SOLN: INTRAVENOUS | Status: AC

## 2022-02-16 NOTE — Progress Notes (Signed)
   02/16/22 1723  Assess: MEWS Score  Temp 99.6 F (37.6 C)  BP (!) 146/80  Pulse Rate 96  Resp 18  Level of Consciousness Alert  SpO2 97 %  O2 Device Room Air  Assess: MEWS Score  MEWS Temp 0  MEWS Systolic 0  MEWS Pulse 0  MEWS RR 0  MEWS LOC 0  MEWS Score 0  MEWS Score Color Green  Assess: if the MEWS score is Yellow or Red  Were vital signs taken at a resting state? Yes  Does the patient meet 2 or more of the SIRS criteria? No  Does the patient have a confirmed or suspected source of infection? No  Treat  Pain Score 0  Notify: Charge Nurse/RN  Name of Charge Nurse/RN Notified Chales Abrahams  Date Charge Nurse/RN Notified 02/16/22  Time Charge Nurse/RN Notified 1730  Notify: Provider  Provider Name/Title Dr. Marisa Severin  Date Provider Notified 02/16/22  Time Provider Notified 1730  Method of Notification Page  Provider response See new orders (Metoprolol)  Date of Provider Response 02/16/22  Time of Provider Response 1730  Document  Progress note created (see row info) Yes  Assess: SIRS CRITERIA  SIRS Temperature  0  SIRS Pulse 1  SIRS Respirations  0  SIRS WBC 1  SIRS Score Sum  2

## 2022-02-16 NOTE — Progress Notes (Addendum)
Pulse now 96 and temp 99.6.  Dr. Jayme Cloud made aware.  Sat up in chair most of day and now back in bed. Dr. Mariea Clonts adjusted metroprolol.

## 2022-02-16 NOTE — Progress Notes (Signed)
PROGRESS NOTE  MARDELL CRAGG EXB:[id number] DOB: Aug 07, 1925 DOA: 02/07/2022 PCP: Sonny Masters, FNP   LOS: 9 days   Brief Narrative / Interim history: 88 year old male with HTN, BPH, HLD, CAD, comes into the hospital on 8/18 with aphasia, inability to walk, right-sided weakness and facial droop.  He was found to have a left acute MCA ischemic stroke with surrounding abnormalities and large vessel occlusion.  Case was discussed with neurology, and after talking with patient's son and given age, comorbidities the decision was made to treat the treatable, complete the work-up without any aggressive interventions.  Pertinent data: CT head 8/18-cytotoxic edema suspected in the left MCA territory, no hemorrhage CTA 8/18-acute left MCA ischemia, near occlusion of left ICA origin and bulb, age-indeterminate but acute appearing thrombus in the left side found with severe stenosis and multiple MCA branch occlusions. Brain MRI 8/18-acute infarct of the left insula, frontal operculum and high frontal lobe and MCA distribution CT head 8/18-hemorrhage or hemorrhagic conversion of the left MCA stroke Chest x-ray 8/20-no active disease 2D echo 8/19-EF 65-70%, no WMA, LVH, RV was normal  Subjective / 24h Interval events: -BP and heart rate trending higher -Swallowing challenges persist  Assesement and Plan: Principal Problem:   Acute ischemic stroke (HCC) Active Problems:   BPH (benign prostatic hyperplasia)   Leukocytosis   Pressure injury of skin   Essential hypertension   Glaucoma  Principal problem Lt MCA Acute CVA-with Broca's aphasia -  -Neurology consult appreciated  -Continue aspirin 81 mg daily along with Plavix 75 mg daily for 90 days then after that STOP the Plavix  and continue ONLY Aspirin 81 mg daily indefinitely--for secondary stroke Prevention (Per The multicenter SAMMPRIS trial) Stroke work-up as above, including 2D echo, CT angiogram head and neck.  Lipid panel shows an LDL of  34 -Right upper extremity weakness and motor aphasia persist 02/16/22 -Remains medically stable for transfer to inpatient rehab when insurance approval is received, awaiting transfer to inpatient rehab for speech, PT and OT rehab  Active problems Glaucoma -Continue treatment with Alphagan and Xalatan.   Essential hypertension -initially allowed permissive hypertension -02/16/22--Increase metoprolol to 25 mg twice daily due to elevated BP and tachyardia  Pressure injury of skin -Stage I perineum/medial sacrum pressure injury appreciated at time of admission.   Leukocytosis -No signs of acute infection appreciated, most likely stress demargination -Leukocytosis resolved   BPH (benign prostatic hyperplasia) -No complaints of urinary retention   Goals of care-palliative care consulted,  DNR/DNI without limitations of care otherwise  Hypernatremia---sodium is up to 148 due to swallowing difficulties/free water deficit -Gentle IV hydration until oral intake improves further - Scheduled Meds:  aspirin EC  81 mg Oral Q breakfast   atorvastatin  10 mg Oral q1800   brimonidine  1 drop Both Eyes TID   clopidogrel  75 mg Oral Daily   heparin  5,000 Units Subcutaneous Q8H   latanoprost  1 drop Both Eyes QPM   metoprolol tartrate  25 mg Oral BID   mouth rinse  15 mL Mouth Rinse 4 times per day   pantoprazole (PROTONIX) IV  40 mg Intravenous Q24H   Continuous Infusions:  sodium chloride 75 mL/hr at 02/16/22 0938   PRN Meds:.[DISCONTINUED] acetaminophen **OR** acetaminophen (TYLENOL) oral liquid 160 mg/5 mL **OR** acetaminophen, labetalol, mouth rinse, senna-docusate  Diet Orders (From admission, onward)     Start     Ordered   02/13/22 1300  DIET - DYS 1 Room service appropriate? Yes;  Fluid consistency: Honey Thick  Diet effective now       Question Answer Comment  Room service appropriate? Yes   Fluid consistency: Honey Thick      02/13/22 1300            DVT prophylaxis:  heparin injection 5,000 Units Start: 02/07/22 1400   Lab Results  Component Value Date   PLT 276 02/16/2022      Code Status: DNR  Disposition: -Remains medically stable for transfer to inpatient rehab when insurance approval is received, awaiting transfer to inpatient rehab for speech, PT and OT rehab  Family Communication: Discussed with son and daughter-in-law  Status is: Inpatient Remains inpatient appropriate because: Rehab placement  Level of care: Telemetry  Consultants:  Neurology Palliative care  Objective: Vitals:   02/16/22 0158 02/16/22 0558 02/16/22 1351 02/16/22 1723  BP: (!) 166/84 (!) 152/71 (!) 160/87 (!) 146/80  Pulse: 87 86 (!) 111 96  Resp: 16 16 19 18   Temp: 98.6 F (37 C) 98.6 F (37 C) 98.1 F (36.7 C) 99.6 F (37.6 C)  TempSrc: Oral Oral  Oral  SpO2:   96% 97%  Weight:      Height:        Intake/Output Summary (Last 24 hours) at 02/16/2022 1802 Last data filed at 02/16/2022 1300 Gross per 24 hour  Intake 1080 ml  Output 600 ml  Net 480 ml   Wt Readings from Last 3 Encounters:  02/07/22 67.9 kg  01/14/22 72.5 kg  12/13/21 74.8 kg   Physical Exam  Gen:- Awake Alert, in no acute distress  HEENT:- [place].AT, No sclera icterus, facial symmetry Neck-Supple Neck,No JVD,.  Lungs-  CTAB , fair air movement bilaterally  CV- S1, S2 normal, RRR Abd-  +ve B.Sounds, Abd Soft, No tenderness,    Extremity/Skin:- No  edema,   good pedal pulses  Psych-affect is appropriate, oriented x3 Neuro-facial asymmetry, motor aphasia persist, swallowing difficulties improving   Data Reviewed: I have independently reviewed following labs and imaging studies   CBC Recent Labs  Lab 02/10/22 0548 02/13/22 0611 02/16/22 0527  WBC 13.8* 10.4 10.2  HGB 13.6 12.4* 12.9*  HCT 42.9 38.9* 40.8  PLT 205 248 276  MCV 100.5* 101.3* 101.7*  MCH 31.9 32.3 32.2  MCHC 31.7 31.9 31.6  RDW 11.9 11.9 12.0    Recent Labs  Lab 02/10/22 0548 02/13/22 0611  02/16/22 0527  NA 138 143 148*  K 3.6 3.3* 3.9  CL 105 106 110  CO2 22 27 30   GLUCOSE 106* 120* 111*  BUN 24* 38* 37*  CREATININE 0.68 0.80 0.83  CALCIUM 8.9 9.1 9.1  AST  --  58*  --   ALT  --  36  --   ALKPHOS  --  69  --   BILITOT  --  1.2  --   ALBUMIN  --  3.1*  --   MG  --  2.1  --     Lab Results  Component Value Date   HGBA1C 5.4 02/07/2022   CBG: Recent Labs  Lab 02/15/22 1624 02/15/22 2107 02/16/22 0712 02/16/22 1105 02/16/22 1614  GLUCAP 150* 152* 109* 133* 142*    Recent Results (from the past 240 hour(s))  Resp Panel by RT-PCR (Flu A&B, Covid) Anterior Nasal Swab     Status: None   Collection Time: 02/07/22 11:19 AM   Specimen: Anterior Nasal Swab  Result Value Ref Range Status   SARS Coronavirus 2 by RT  PCR NEGATIVE NEGATIVE Final    Comment: (NOTE) SARS-CoV-2 target nucleic acids are NOT DETECTED.  The SARS-CoV-2 RNA is generally detectable in upper respiratory specimens during the acute phase of infection. The lowest concentration of SARS-CoV-2 viral copies this assay can detect is 138 copies/mL. A negative result does not preclude SARS-Cov-2 infection and should not be used as the sole basis for treatment or other patient management decisions. A negative result may occur with  improper specimen collection/handling, submission of specimen other than nasopharyngeal swab, presence of viral mutation(s) within the areas targeted by this assay, and inadequate number of viral copies(<138 copies/mL). A negative result must be combined with clinical observations, patient history, and epidemiological information. The expected result is Negative.  Fact Sheet for Patients:  BloggerCourse.com  Fact Sheet for Healthcare Providers:  SeriousBroker.it  This test is no t yet approved or cleared by the [place] FDA and  has been authorized for detection and/or diagnosis of SARS-CoV-2 by FDA under an  Emergency Use Authorization (EUA). This EUA will remain  in effect (meaning this test can be used) for the duration of the COVID-19 declaration under Section 564(b)(1) of the Act, 21 U.S.C.section 360bbb-3(b)(1), unless the authorization is terminated  or revoked sooner.       Influenza A by PCR NEGATIVE NEGATIVE Final   Influenza B by PCR NEGATIVE NEGATIVE Final    Comment: (NOTE) The Xpert Xpress SARS-CoV-2/FLU/RSV plus assay is intended as an aid in the diagnosis of influenza from Nasopharyngeal swab specimens and should not be used as a sole basis for treatment. Nasal washings and aspirates are unacceptable for Xpert Xpress SARS-CoV-2/FLU/RSV testing.  Fact Sheet for Patients: BloggerCourse.com  Fact Sheet for Healthcare Providers: SeriousBroker.it  This test is not yet approved or cleared by the [place] FDA and has been authorized for detection and/or diagnosis of SARS-CoV-2 by FDA under an Emergency Use Authorization (EUA). This EUA will remain in effect (meaning this test can be used) for the duration of the COVID-19 declaration under Section 564(b)(1) of the Act, 21 U.S.C. section 360bbb-3(b)(1), unless the authorization is terminated or revoked.  Performed at [facility], [address], [place], [place] [address]   Culture, blood (Routine X 2) w Reflex to ID Panel     Status: None   Collection Time: 02/10/22 11:11 PM   Specimen: BLOOD LEFT HAND  Result Value Ref Range Status   Specimen Description BLOOD LEFT HAND  Final   Special Requests   Final    BOTTLES DRAWN AEROBIC AND ANAEROBIC Blood Culture adequate volume   Culture   Final    NO GROWTH 5 DAYS Performed at [facility], [address], [place], [place] [address]    Report Status 02/15/2022 FINAL  Final  Culture, blood (Routine X 2) w Reflex to ID Panel     Status: None   Collection Time: 02/10/22 11:15 PM   Specimen: BLOOD RIGHT HAND  Result Value  Ref Range Status   Specimen Description BLOOD RIGHT HAND  Final   Special Requests   Final    BOTTLES DRAWN AEROBIC AND ANAEROBIC Blood Culture adequate volume   Culture   Final    NO GROWTH 5 DAYS Performed at [facility], [address] [place], [place] [address]    Report Status 02/15/2022 FINAL  Final    Dishon Kehoe Mariea Clonts, MD  [facility]  Between 7 am - 7 pm I am available, please contact me via Amion (for emergencies) or Securechat (non  urgent messages)  Between 7 pm - 7 am I am not available, please contact night coverage MD/APP via Amion

## 2022-02-16 NOTE — Progress Notes (Signed)
   02/16/22 1351  Assess: MEWS Score  Temp 98.1 F (36.7 C)  BP (!) 160/87  MAP (mmHg) 111  Pulse Rate (!) 111  Resp 19  Level of Consciousness Alert  SpO2 96 %  O2 Device Room Air  Assess: MEWS Score  MEWS Temp 0  MEWS Systolic 0  MEWS Pulse 2  MEWS RR 0  MEWS LOC 0  MEWS Score 2  MEWS Score Color Yellow  Assess: if the MEWS score is Yellow or Red  Were vital signs taken at a resting state? Yes  Focused Assessment No change from prior assessment  Does the patient meet 2 or more of the SIRS criteria? No  Does the patient have a confirmed or suspected source of infection? No  MEWS guidelines implemented *See Row Information* Yes  Treat  Pain Score 0  Take Vital Signs  Increase Vital Sign Frequency  Yellow: Q 2hr X 2 then Q 4hr X 2, if remains yellow, continue Q 4hrs  Notify: Charge Nurse/RN  Name of Charge Nurse/RN Notified Chales Abrahams  Date Charge Nurse/RN Notified 02/16/22  Time Charge Nurse/RN Notified 1618  Notify: Provider  Provider Name/Title Dr. Marisa Severin  Date Provider Notified 02/16/22  Time Provider Notified 1545  Method of Notification Page  Notification Reason Change in status (tacycardic)  Provider response No new orders  Document  Patient Outcome Other (Comment) (tachycardic)  Progress note created (see row info) Yes  Assess: SIRS CRITERIA  SIRS Temperature  0  SIRS Pulse 1  SIRS Respirations  0  SIRS WBC 1  SIRS Score Sum  2

## 2022-02-17 ENCOUNTER — Other Ambulatory Visit: Payer: Self-pay

## 2022-02-17 ENCOUNTER — Encounter (HOSPITAL_COMMUNITY): Payer: Self-pay | Admitting: Physical Medicine & Rehabilitation

## 2022-02-17 ENCOUNTER — Inpatient Hospital Stay (HOSPITAL_COMMUNITY)
Admission: RE | Admit: 2022-02-17 | Discharge: 2022-03-13 | DRG: 056 | Disposition: A | Discharge status: Home Health | Payer: Medicare Other | Source: Other Acute Inpatient Hospital | Attending: Physical Medicine & Rehabilitation | Admitting: Physical Medicine & Rehabilitation

## 2022-02-17 DIAGNOSIS — I63312 Cerebral infarction due to thrombosis of left middle cerebral artery: Secondary | ICD-10-CM | POA: Diagnosis not present

## 2022-02-17 DIAGNOSIS — Z888 Allergy status to other drugs, medicaments and biological substances status: Secondary | ICD-10-CM | POA: Diagnosis not present

## 2022-02-17 DIAGNOSIS — H9193 Unspecified hearing loss, bilateral: Secondary | ICD-10-CM

## 2022-02-17 DIAGNOSIS — I69392 Facial weakness following cerebral infarction: Secondary | ICD-10-CM | POA: Diagnosis not present

## 2022-02-17 DIAGNOSIS — E871 Hypo-osmolality and hyponatremia: Secondary | ICD-10-CM | POA: Diagnosis present

## 2022-02-17 DIAGNOSIS — U071 COVID-19: Secondary | ICD-10-CM | POA: Diagnosis not present

## 2022-02-17 DIAGNOSIS — I69351 Hemiplegia and hemiparesis following cerebral infarction affecting right dominant side: Principal | ICD-10-CM

## 2022-02-17 DIAGNOSIS — Z8616 Personal history of COVID-19: Secondary | ICD-10-CM | POA: Diagnosis not present

## 2022-02-17 DIAGNOSIS — I639 Cerebral infarction, unspecified: Secondary | ICD-10-CM | POA: Diagnosis not present

## 2022-02-17 DIAGNOSIS — R7989 Other specified abnormal findings of blood chemistry: Secondary | ICD-10-CM | POA: Diagnosis not present

## 2022-02-17 DIAGNOSIS — I63512 Cerebral infarction due to unspecified occlusion or stenosis of left middle cerebral artery: Secondary | ICD-10-CM

## 2022-02-17 DIAGNOSIS — E43 Unspecified severe protein-calorie malnutrition: Secondary | ICD-10-CM | POA: Diagnosis not present

## 2022-02-17 DIAGNOSIS — Z7902 Long term (current) use of antithrombotics/antiplatelets: Secondary | ICD-10-CM | POA: Diagnosis not present

## 2022-02-17 DIAGNOSIS — I10 Essential (primary) hypertension: Secondary | ICD-10-CM | POA: Diagnosis not present

## 2022-02-17 DIAGNOSIS — E782 Mixed hyperlipidemia: Secondary | ICD-10-CM

## 2022-02-17 DIAGNOSIS — Z79899 Other long term (current) drug therapy: Secondary | ICD-10-CM

## 2022-02-17 DIAGNOSIS — Z8249 Family history of ischemic heart disease and other diseases of the circulatory system: Secondary | ICD-10-CM

## 2022-02-17 DIAGNOSIS — I69354 Hemiplegia and hemiparesis following cerebral infarction affecting left non-dominant side: Secondary | ICD-10-CM | POA: Diagnosis not present

## 2022-02-17 DIAGNOSIS — R1310 Dysphagia, unspecified: Secondary | ICD-10-CM | POA: Diagnosis not present

## 2022-02-17 DIAGNOSIS — E876 Hypokalemia: Secondary | ICD-10-CM | POA: Diagnosis not present

## 2022-02-17 DIAGNOSIS — R509 Fever, unspecified: Secondary | ICD-10-CM | POA: Diagnosis not present

## 2022-02-17 DIAGNOSIS — I672 Cerebral atherosclerosis: Secondary | ICD-10-CM | POA: Diagnosis not present

## 2022-02-17 DIAGNOSIS — N400 Enlarged prostate without lower urinary tract symptoms: Secondary | ICD-10-CM | POA: Diagnosis present

## 2022-02-17 DIAGNOSIS — Z6823 Body mass index (BMI) 23.0-23.9, adult: Secondary | ICD-10-CM | POA: Diagnosis not present

## 2022-02-17 DIAGNOSIS — M25531 Pain in right wrist: Secondary | ICD-10-CM | POA: Diagnosis not present

## 2022-02-17 DIAGNOSIS — M5412 Radiculopathy, cervical region: Secondary | ICD-10-CM | POA: Diagnosis present

## 2022-02-17 DIAGNOSIS — E870 Hyperosmolality and hypernatremia: Secondary | ICD-10-CM | POA: Diagnosis not present

## 2022-02-17 DIAGNOSIS — I63412 Cerebral infarction due to embolism of left middle cerebral artery: Secondary | ICD-10-CM | POA: Diagnosis not present

## 2022-02-17 DIAGNOSIS — I69391 Dysphagia following cerebral infarction: Secondary | ICD-10-CM

## 2022-02-17 DIAGNOSIS — D649 Anemia, unspecified: Secondary | ICD-10-CM | POA: Diagnosis present

## 2022-02-17 DIAGNOSIS — Z66 Do not resuscitate: Secondary | ICD-10-CM | POA: Diagnosis not present

## 2022-02-17 DIAGNOSIS — D72823 Leukemoid reaction: Secondary | ICD-10-CM | POA: Diagnosis not present

## 2022-02-17 DIAGNOSIS — N179 Acute kidney failure, unspecified: Secondary | ICD-10-CM | POA: Diagnosis present

## 2022-02-17 DIAGNOSIS — E785 Hyperlipidemia, unspecified: Secondary | ICD-10-CM | POA: Diagnosis not present

## 2022-02-17 DIAGNOSIS — K449 Diaphragmatic hernia without obstruction or gangrene: Secondary | ICD-10-CM | POA: Diagnosis not present

## 2022-02-17 DIAGNOSIS — I69320 Aphasia following cerebral infarction: Secondary | ICD-10-CM | POA: Diagnosis not present

## 2022-02-17 DIAGNOSIS — Z8673 Personal history of transient ischemic attack (TIA), and cerebral infarction without residual deficits: Secondary | ICD-10-CM

## 2022-02-17 DIAGNOSIS — R4701 Aphasia: Secondary | ICD-10-CM | POA: Diagnosis not present

## 2022-02-17 DIAGNOSIS — M19031 Primary osteoarthritis, right wrist: Secondary | ICD-10-CM | POA: Diagnosis not present

## 2022-02-17 LAB — RENAL FUNCTION PANEL
Albumin: 2.9 g/dL — ABNORMAL LOW (ref 3.5–5.0)
Anion gap: 9 (ref 5–15)
BUN: 32 mg/dL — ABNORMAL HIGH (ref 8–23)
CO2: 27 mmol/L (ref 22–32)
Calcium: 8.4 mg/dL — ABNORMAL LOW (ref 8.9–10.3)
Chloride: 109 mmol/L (ref 98–111)
Creatinine, Ser: 0.81 mg/dL (ref 0.61–1.24)
GFR, Estimated: >60 mL/min (ref >60)
Glucose, Bld: 104 mg/dL — ABNORMAL HIGH (ref 70–99)
Phosphorus: 3.7 mg/dL (ref 2.5–4.6)
Potassium: 3.7 mmol/L (ref 3.5–5.1)
Sodium: 145 mmol/L (ref 135–145)

## 2022-02-17 LAB — BASIC METABOLIC PANEL
Anion gap: 8 (ref 5–15)
BUN: 32 mg/dL — ABNORMAL HIGH (ref 8–23)
CO2: 26 mmol/L (ref 22–32)
Calcium: 8.4 mg/dL — ABNORMAL LOW (ref 8.9–10.3)
Chloride: 109 mmol/L (ref 98–111)
Creatinine, Ser: 0.76 mg/dL (ref 0.61–1.24)
GFR, Estimated: >60 mL/min (ref >60)
Glucose, Bld: 101 mg/dL — ABNORMAL HIGH (ref 70–99)
Potassium: 4.1 mmol/L (ref 3.5–5.1)
Sodium: 143 mmol/L (ref 135–145)

## 2022-02-17 LAB — GLUCOSE, CAPILLARY
Glucose-Capillary: 101 mg/dL — ABNORMAL HIGH (ref 70–99)
Glucose-Capillary: 109 mg/dL — ABNORMAL HIGH (ref 70–99)
Glucose-Capillary: 127 mg/dL — ABNORMAL HIGH (ref 70–99)
Glucose-Capillary: 98 mg/dL (ref 70–99)

## 2022-02-17 MED ORDER — ATORVASTATIN CALCIUM 10 MG PO TABS: 10.0000 mg | ORAL_TABLET | Freq: Every day | ORAL | 1 refills | Status: DC

## 2022-02-17 MED ORDER — ATORVASTATIN CALCIUM 10 MG PO TABS
10.0000 mg | ORAL_TABLET | Freq: Every day | ORAL | Status: DC
  Administered 2022-02-17 – 2022-03-12 (×23): 10 mg via ORAL
  Filled 2022-02-17 (×24): qty 1

## 2022-02-17 MED ORDER — PROCHLORPERAZINE 25 MG RE SUPP: 12.5000 mg | Freq: Four times a day (QID) | RECTAL | Status: DC | PRN

## 2022-02-17 MED ORDER — DIPHENHYDRAMINE HCL 12.5 MG/5ML PO ELIX: 12.5000 mg | ORAL_SOLUTION | Freq: Four times a day (QID) | ORAL | Status: DC | PRN

## 2022-02-17 MED ORDER — SENNOSIDES-DOCUSATE SODIUM 8.6-50 MG PO TABS: 2.0000 | ORAL_TABLET | Freq: Every day | ORAL | 3 refills | Status: DC

## 2022-02-17 MED ORDER — DORZOLAMIDE HCL-TIMOLOL MAL 2-0.5 % OP SOLN
1.0000 [drp] | Freq: Two times a day (BID) | OPHTHALMIC | Status: DC
  Administered 2022-02-18 – 2022-03-13 (×44): 1 [drp] via OPHTHALMIC
  Filled 2022-02-17 (×2): qty 10

## 2022-02-17 MED ORDER — ASPIRIN 81 MG PO TBEC
81.0000 mg | DELAYED_RELEASE_TABLET | Freq: Every day | ORAL | Status: DC
  Administered 2022-02-18 – 2022-03-13 (×24): 81 mg via ORAL
  Filled 2022-02-17 (×24): qty 1

## 2022-02-17 MED ORDER — TRAZODONE HCL 50 MG PO TABS
25.0000 mg | ORAL_TABLET | Freq: Every evening | ORAL | Status: DC | PRN
  Administered 2022-02-20: 50 mg via ORAL
  Filled 2022-02-17: qty 1

## 2022-02-17 MED ORDER — METOPROLOL SUCCINATE ER 50 MG PO TB24: 50.0000 mg | ORAL_TABLET | Freq: Every day | ORAL | 11 refills | Status: DC

## 2022-02-17 MED ORDER — ENOXAPARIN SODIUM 30 MG/0.3ML IJ SOSY
30.0000 mg | PREFILLED_SYRINGE | INTRAMUSCULAR | Status: DC
  Administered 2022-02-17 – 2022-03-12 (×24): 30 mg via SUBCUTANEOUS
  Filled 2022-02-17 (×24): qty 0.3

## 2022-02-17 MED ORDER — BISACODYL 10 MG RE SUPP: 10.0000 mg | Freq: Every day | RECTAL | Status: DC | PRN

## 2022-02-17 MED ORDER — POLYETHYLENE GLYCOL 3350 17 G PO PACK: 17.0000 g | PACK | Freq: Every day | ORAL | Status: DC | PRN

## 2022-02-17 MED ORDER — LATANOPROST 0.005 % OP SOLN
1.0000 [drp] | Freq: Every evening | OPHTHALMIC | Status: DC
  Administered 2022-02-17 – 2022-03-12 (×23): 1 [drp] via OPHTHALMIC
  Filled 2022-02-17: qty 2.5

## 2022-02-17 MED ORDER — FINASTERIDE 5 MG PO TABS
5.0000 mg | ORAL_TABLET | Freq: Every day | ORAL | Status: DC
  Administered 2022-02-18 – 2022-03-13 (×24): 5 mg via ORAL
  Filled 2022-02-17 (×24): qty 1

## 2022-02-17 MED ORDER — METOPROLOL TARTRATE 25 MG PO TABS
25.0000 mg | ORAL_TABLET | Freq: Two times a day (BID) | ORAL | Status: DC
  Administered 2022-02-17 – 2022-03-01 (×25): 25 mg via ORAL
  Filled 2022-02-17 (×25): qty 1

## 2022-02-17 MED ORDER — GUAIFENESIN-DM 100-10 MG/5ML PO SYRP: 5.0000 mL | ORAL_SOLUTION | Freq: Four times a day (QID) | ORAL | Status: DC | PRN

## 2022-02-17 MED ORDER — CLOPIDOGREL BISULFATE 75 MG PO TABS
75.0000 mg | ORAL_TABLET | Freq: Every day | ORAL | Status: DC
  Administered 2022-02-18 – 2022-03-13 (×24): 75 mg via ORAL
  Filled 2022-02-17 (×24): qty 1

## 2022-02-17 MED ORDER — CLOPIDOGREL BISULFATE 75 MG PO TABS: 75.0000 mg | ORAL_TABLET | Freq: Every day | ORAL | 1 refills | Status: DC

## 2022-02-17 MED ORDER — PANTOPRAZOLE SODIUM 40 MG PO TBEC: 40.0000 mg | DELAYED_RELEASE_TABLET | Freq: Every day | ORAL | 1 refills | Status: DC

## 2022-02-17 MED ORDER — ACETAMINOPHEN 325 MG PO TABS
325.0000 mg | ORAL_TABLET | ORAL | Status: DC | PRN
  Administered 2022-02-20 – 2022-03-12 (×7): 650 mg via ORAL
  Filled 2022-02-17 (×7): qty 2

## 2022-02-17 MED ORDER — PROCHLORPERAZINE MALEATE 5 MG PO TABS: 5.0000 mg | ORAL_TABLET | Freq: Four times a day (QID) | ORAL | Status: DC | PRN

## 2022-02-17 MED ORDER — ORAL CARE MOUTH RINSE: 15.0000 mL | OROMUCOSAL | Status: DC | PRN

## 2022-02-17 MED ORDER — PANTOPRAZOLE SODIUM 40 MG IV SOLR: 40.0000 mg | INTRAVENOUS | Status: DC

## 2022-02-17 MED ORDER — FLEET ENEMA 7-19 GM/118ML RE ENEM: 1.0000 | ENEMA | Freq: Once | RECTAL | Status: DC | PRN

## 2022-02-17 MED ORDER — ALUM & MAG HYDROXIDE-SIMETH 200-200-20 MG/5ML PO SUSP: 30.0000 mL | ORAL | Status: DC | PRN

## 2022-02-17 MED ORDER — ALBUTEROL SULFATE (2.5 MG/3ML) 0.083% IN NEBU: 3.0000 mL | INHALATION_SOLUTION | Freq: Four times a day (QID) | RESPIRATORY_TRACT | Status: DC | PRN

## 2022-02-17 MED ORDER — PROCHLORPERAZINE EDISYLATE 10 MG/2ML IJ SOLN: 5.0000 mg | Freq: Four times a day (QID) | INTRAMUSCULAR | Status: DC | PRN

## 2022-02-17 MED ORDER — ORAL CARE MOUTH RINSE
15.0000 mL | OROMUCOSAL | Status: DC
  Administered 2022-02-17 – 2022-03-13 (×87): 15 mL via OROMUCOSAL

## 2022-02-17 MED ORDER — PANTOPRAZOLE SODIUM 40 MG PO TBEC
40.0000 mg | DELAYED_RELEASE_TABLET | Freq: Every day | ORAL | Status: DC
  Filled 2022-02-17: qty 1

## 2022-02-17 MED ORDER — ASPIRIN 81 MG PO TBEC: 81.0000 mg | DELAYED_RELEASE_TABLET | Freq: Every day | ORAL | 12 refills | Status: DC

## 2022-02-17 MED ORDER — BRIMONIDINE TARTRATE 0.2 % OP SOLN
1.0000 [drp] | Freq: Three times a day (TID) | OPHTHALMIC | Status: DC
  Administered 2022-02-18 – 2022-03-13 (×66): 1 [drp] via OPHTHALMIC
  Filled 2022-02-17: qty 5

## 2022-02-17 NOTE — H&P (Signed)
Physical Medicine and Rehabilitation Admission H&P    Chief Complaint  Patient presents with   Functional deficits due to stroke.     HPI:  Paul Bradshaw is a 88 year old male with history of HTN, BPH, pre-diabetes, chronic back pain with radiculopathy who was admitted to [facility] on 02/07/22 with right sided weakness and inability to talk. He was found to have acute L-MCA stroke with  core infarct relative to penumbra and near occlusion of L-ICA  with acute thrombus left siphon with severe stenosis and multiple MCA branch occlusions. Patient felt to be  poor candidate for intervention. Family elected on DNR and did not want to pursue aggressive interventions. 2D echo done revealing EF 65-70% with V He was started on D1, nectar which was down graded to honey on 08/24 due to premature spillage with delay in swallow initiation.   Neurology recommends DAPT X 3 months followed by ASA alone.  He has had fevers which is resolving with improvement in leucocytosis. He was noted to be stable neurologically and is showing improvement in alertness but continues to be limited by RUE weakness, right facial droop and expressive> receptive aphasia. CIR recommended due to functional decline.    Review of Systems  Unable to perform ROS: Language     Past Medical History:  Diagnosis Date   Aortic regurgitation    Moderate   Arthritis    BPH (benign prostatic hyperplasia)    Cervical disc disease    Cervical radiculopathy    Hyperlipidemia    Hypertension    Prediabetes 02/16/2021   Staphylococcus aureus bacteremia 08/09/2012   TEE negative for vegetation or thrombus February 2014   Stroke [facility]) 2005 or 2006   3   Symptomatic carotid artery stenosis with infarction [facility]) 2005 or 2006   Status post right carotid endarterectomy   Past Surgical History:  Procedure Laterality Date   BACK SURGERY     CAROTID ENDARTERECTOMY     CATARACT EXTRACTION W/PHACO Left 02/15/2015   Procedure: CATARACT  EXTRACTION PHACO AND INTRAOCULAR LENS PLACEMENT LEFT EYE CDE=30.93;  Surgeon: Gemma Payor, MD;  Location: [facility] ORS;  Service: Ophthalmology;  Laterality: Left;   CHOLECYSTECTOMY     EYE SURGERY     KIDNEY STONE SURGERY     LUMBAR LAMINECTOMY/DECOMPRESSION MICRODISCECTOMY Bilateral 01/23/2014   Procedure: LUMBAR LAMINECTOMY/DECOMPRESSION MICRODISCECTOMY 1 LEVEL L5-S1;  Surgeon: Temple Pacini, MD;  Location: [facility] NEURO ORS;  Service: Neurosurgery;  Laterality: Bilateral;  LUMBAR LAMINECTOMY/DECOMPRESSION MICRODISCECTOMY 1 LEVEL L5-S1   TEE WITHOUT CARDIOVERSION N/A 08/12/2012   Procedure: TRANSESOPHAGEAL ECHOCARDIOGRAM (TEE);  Surgeon: Wendall Stade, MD;  Location: [facility] ENDO SUITE;  Service: Cardiovascular;  Laterality: N/A;   TEE WITHOUT CARDIOVERSION N/A 06/24/2013   Procedure: TRANSESOPHAGEAL ECHOCARDIOGRAM (TEE);  Surgeon: Jonelle Sidle, MD;  Location: [facility] ENDO SUITE;  Service: Endoscopy;  Laterality: N/A;   Family History  Problem Relation Age of Onset   Emphysema Father    Alzheimer's disease Mother    Heart disease Brother    Heart attack Brother    COPD Son     Social History: Married to second wife for 30 years. 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 Per  reports that he has never smoked. He has never used smokeless tobacco. Per reports that he does not drink alcohol and does not use drugs.   Allergies  Allergen Reactions  Amlodipine Swelling    Swelling of lips.   Lisinopril Other (See Comments)    Elevated BP higher & causes a burning feeling on the inside.     Medications Prior to Admission  Medication Sig Dispense Refill   albuterol (VENTOLIN HFA) 108 (90 Base) MCG/ACT inhaler Inhale 2 puffs into the lungs every 6 (six) hours as needed for shortness of breath or wheezing. 18 g 0   atorvastatin (LIPITOR) 10 MG tablet TAKE ONE-HALF TABLET BY  MOUTH DAILY AT 6 PM 45 tablet 1    brimonidine (ALPHAGAN) 0.2 % ophthalmic solution Place 1 drop into both eyes 3 (three) times daily.     cetirizine (ZYRTEC) 10 MG tablet Take 10 mg by mouth daily.     clopidogrel (PLAVIX) 75 MG tablet TAKE 1 TABLET BY MOUTH  DAILY WITH BREAKFAST 90 tablet 1   dorzolamide-timolol (COSOPT) 22.3-6.8 MG/ML ophthalmic solution Place 1 drop into both eyes 2 (two) times daily.     finasteride (PROSCAR) 5 MG tablet Take 1 tablet (5 mg total) by mouth daily. 90 tablet 3   HYDROcodone-acetaminophen (NORCO) 7.5-325 MG tablet Take 1 tablet by mouth every 6 (six) hours as needed for moderate pain. 90 tablet 0   latanoprost (XALATAN) 0.005 % ophthalmic solution Place 1 drop into both eyes every evening.     Multiple Vitamins-Minerals (MULTIVITAMIN PO) Take 1 tablet by mouth daily.     tamsulosin (FLOMAX) 0.4 MG CAPS capsule TAKE 1 CAPSULE BY MOUTH IN  THE EVENING 90 capsule 1   Turmeric 1053 MG TABS Take 1 tablet by mouth daily.     acetaminophen (TYLENOL) 500 MG tablet Take 1,000 mg by mouth every 8 (eight) hours as needed for moderate pain.     HYDROcodone-acetaminophen (NORCO) 7.5-325 MG tablet Take 1 tablet by mouth every 6 (six) hours as needed for moderate pain. (Patient not taking: Reported on 02/07/2022) 90 tablet 0   [START ON 03/15/2022] HYDROcodone-acetaminophen (NORCO) 7.5-325 MG tablet Take 1 tablet by mouth every 6 (six) hours as needed for moderate pain. (Patient not taking: Reported on 02/07/2022) 90 tablet 0      Home: Home Living Family/patient expects to be discharged to:: Private residence Living Arrangements: Spouse/significant other Available Help at Discharge: Family, Available 24 hours/day (son, Legrand Como and his friend, Shauna Hugh, to be caregivers) Type of Home: House Home Access: Stairs to enter, Ramped entrance Technical brewer of Steps: 6 Entrance Stairs-Rails: Right [place]: One level Bathroom Shower/Tub: Chiropodist: [place]: [employer] -  single point, Civil engineer, contracting, Conservation officer, nature (2 wheels) Additional Comments: info taken from document review  Lives With: Spouse   Functional History: Prior Function Prior Level of Function : Independent/Modified Independent Mobility Comments: Household and short distanced community ambulator using RW PRN ADLs Comments: ind with ADLs/IADLs, cares for disabled spouse  Functional Status:  Mobility: Bed Mobility Overal bed mobility: Needs Assistance Bed Mobility: Supine to Sit Supine to sit: Mod assist, HOB elevated Sit to supine: Mod assist, Max assist General bed mobility comments: Pt able to pull to sit with Min to mod A but more assist needed to scoot to EOB. Transfers Overall transfer level: Needs assistance Equipment used: Hemi-walker Transfers: Sit to/from Stand, Bed to chair/wheelchair/BSC Sit to Stand: Mod assist, Min assist Bed to/from chair/wheelchair/BSC transfer type:: Step pivot Step pivot transfers: Mod assist General transfer comment: Hemi-walker used with therapist's on either side. min/mod assist to power up to standing, cueing and demonstration of proper hemiwalker use Ambulation/Gait  Ambulation/Gait assistance: Min assist, Mod assist Gait Distance (Feet): 20 Feet Assistive device: Hemi-walker Gait Pattern/deviations: Step-to pattern, Trunk flexed General Gait Details: slow, labored steps from bed to chair initially; after a short seated rest break patient able to ambulate increased distance with use of hemiwalker and assist for balance, gait speed improves with time Gait velocity: decreased    ADL: ADL Overall ADL's : Needs assistance/impaired Eating/Feeding: Set up, Minimal assistance, Sitting Eating/Feeding Details (indicate cue type and reason): Pt struggled to feed himself if pieces where not very small as seen by missing his mouth to his R side. Grooming: Sitting, Moderate assistance, Maximal assistance Upper Body Bathing: Moderate assistance, Maximal  assistance, Sitting Lower Body Bathing: Maximal assistance, Total assistance, Sitting/lateral leans Upper Body Dressing : Moderate assistance, Maximal assistance, Sitting Lower Body Dressing: Maximal assistance, Total assistance, Sitting/lateral leans Toilet Transfer: Moderate assistance, Maximal assistance, Stand-pivot Toilet Transfer Details (indicate cue type and reason): Partially simulated via sit to stand from EOB. Pt unable to take steps to L side. Toileting- Clothing Manipulation and Hygiene: Total assistance, Bed level Tub/ Shower Transfer: Maximal assistance, Stand-pivot, Tub bench Functional mobility during ADLs: Moderate assistance (hemi-walker) General ADL Comments: Pt able to ambulate to doorway with hemi-walker. Slow labored movement and assist to keep R UE supported.  Cognition: Cognition Overall Cognitive Status: Impaired/Different from baseline Arousal/Alertness: Awake/alert Orientation Level: Other (comment) (unable to assess, non verbal) Cognition Arousal/Alertness: Awake/alert Behavior During Therapy: WFL for tasks assessed/performed, Flat affect Overall Cognitive Status: Impaired/Different from baseline General Comments: Pt able to follow commands with verbal cuing, tactile cuing, and modeling.   Blood pressure (!) 167/90, pulse 96, temperature 98 F (36.7 C), resp. rate 14, height 5\' 5"  (1.651 m), weight 67.9 kg, SpO2 99 %. Physical Exam Vitals and nursing note reviewed.  Constitutional:      General: He is not in acute distress.    Appearance: Normal appearance. He is not ill-appearing.  HENT:     Head: Normocephalic and atraumatic.     Right Ear: External ear normal.     Left Ear: External ear normal.     Nose: Nose normal.     Mouth/Throat:     Mouth: Mucous membranes are moist.  Eyes:     Extraocular Movements: Extraocular movements intact.     Pupils: Pupils are equal, round, and reactive to light.  Cardiovascular:     Rate and Rhythm: Normal rate  and regular rhythm.     Heart sounds: No murmur heard.    No gallop.  Pulmonary:     Effort: Pulmonary effort is normal. No respiratory distress.     Breath sounds: No wheezing.  Abdominal:     General: There is no distension.     Palpations: Abdomen is soft. There is no mass.  Musculoskeletal:        General: No swelling or tenderness.     Cervical back: Normal range of motion.  Skin:    General: Skin is warm.  Neurological:     Mental Status: He is alert.     Comments: Right facial droop with dense expressive aphasia. Able to follow simple motor commands with verbal/tactile cues. Right inattention. RUE 0/5. RLE 3-4/5 prox to distal. LUE and LLE 4-5/5. Seemed to sense pain in all 4's. No resting tone.   Psychiatric:     Comments: Flat but cooperative     Results for orders placed or performed during the hospital encounter of 02/07/22 (from the past 48 hour(s))  Glucose,  capillary     Status: Abnormal   Collection Time: 02/15/22 11:05 AM  Result Value Ref Range   Glucose-Capillary 134 (H) 70 - 99 mg/dL    Comment: Glucose reference range applies only to samples taken after fasting for at least 8 hours.  Glucose, capillary     Status: Abnormal   Collection Time: 02/15/22  4:24 PM  Result Value Ref Range   Glucose-Capillary 150 (H) 70 - 99 mg/dL    Comment: Glucose reference range applies only to samples taken after fasting for at least 8 hours.  Glucose, capillary     Status: Abnormal   Collection Time: 02/15/22  9:07 PM  Result Value Ref Range   Glucose-Capillary 152 (H) 70 - 99 mg/dL    Comment: Glucose reference range applies only to samples taken after fasting for at least 8 hours.   Comment 1 Notify RN    Comment 2 Document in Chart   CBC     Status: Abnormal   Collection Time: 02/16/22  5:27 AM  Result Value Ref Range   WBC 10.2 4.0 - 10.5 K/uL   RBC 4.01 (L) 4.22 - 5.81 MIL/uL   Hemoglobin 12.9 (L) 13.0 - 17.0 g/dL   HCT 40.8 39.0 - 52.0 %   MCV 101.7 (H) 80.0 -  100.0 fL   MCH 32.2 26.0 - 34.0 pg   MCHC 31.6 30.0 - 36.0 g/dL   RDW 12.0 11.5 - 15.5 %   Platelets 276 150 - 400 K/uL   nRBC 0.0 0.0 - 0.2 %    Comment: Performed at [facility], [address], [place], [place] [id number]  Basic metabolic panel     Status: Abnormal   Collection Time: 02/16/22  5:27 AM  Result Value Ref Range   Sodium 148 (H) 135 - 145 mmol/L   Potassium 3.9 3.5 - 5.1 mmol/L   Chloride 110 98 - 111 mmol/L   CO2 30 22 - 32 mmol/L   Glucose, Bld 111 (H) 70 - 99 mg/dL    Comment: Glucose reference range applies only to samples taken after fasting for at least 8 hours.   BUN 37 (H) 8 - 23 mg/dL   Creatinine, Ser 0.83 0.61 - 1.24 mg/dL   Calcium 9.1 8.9 - 10.3 mg/dL   GFR, Estimated >60 >60 mL/min    Comment: (NOTE) Calculated using the CKD-EPI Creatinine Equation (2021)    Anion gap 8 5 - 15    Comment: Performed at [facility], [address] [place], [place] [address]  Glucose, capillary     Status: Abnormal   Collection Time: 02/16/22  7:12 AM  Result Value Ref Range   Glucose-Capillary 109 (H) 70 - 99 mg/dL    Comment: Glucose reference range applies only to samples taken after fasting for at least 8 hours.  Glucose, capillary     Status: Abnormal   Collection Time: 02/16/22 11:05 AM  Result Value Ref Range   Glucose-Capillary 133 (H) 70 - 99 mg/dL    Comment: Glucose reference range applies only to samples taken after fasting for at least 8 hours.  Glucose, capillary     Status: Abnormal   Collection Time: 02/16/22  4:14 PM  Result Value Ref Range   Glucose-Capillary 142 (H) 70 - 99 mg/dL    Comment: Glucose reference range applies only to samples taken after fasting for at least 8 hours.  Glucose, capillary     Status: Abnormal   Collection Time: 02/16/22  9:40 PM  Result Value Ref Range   Glucose-Capillary 101 (H) 70 - 99 mg/dL    Comment: Glucose reference range applies only to samples taken after fasting for at least 8 hours.  Glucose, capillary      Status: Abnormal   Collection Time: 02/17/22 12:56 AM  Result Value Ref Range   Glucose-Capillary 101 (H) 70 - 99 mg/dL    Comment: Glucose reference range applies only to samples taken after fasting for at least 8 hours.  Glucose, capillary     Status: None   Collection Time: 02/17/22  5:20 AM  Result Value Ref Range   Glucose-Capillary 98 70 - 99 mg/dL    Comment: Glucose reference range applies only to samples taken after fasting for at least 8 hours.  Glucose, capillary     Status: Abnormal   Collection Time: 02/17/22  7:45 AM  Result Value Ref Range   Glucose-Capillary 109 (H) 70 - 99 mg/dL    Comment: Glucose reference range applies only to samples taken after fasting for at least 8 hours.  Renal function panel     Status: Abnormal   Collection Time: 02/17/22  9:30 AM  Result Value Ref Range   Sodium 145 135 - 145 mmol/L   Potassium 3.7 3.5 - 5.1 mmol/L   Chloride 109 98 - 111 mmol/L   CO2 27 22 - 32 mmol/L   Glucose, Bld 104 (H) 70 - 99 mg/dL    Comment: Glucose reference range applies only to samples taken after fasting for at least 8 hours.   BUN 32 (H) 8 - 23 mg/dL   Creatinine, Ser 0.81 0.61 - 1.24 mg/dL   Calcium 8.4 (L) 8.9 - 10.3 mg/dL   Phosphorus 3.7 2.5 - 4.6 mg/dL   Albumin 2.9 (L) 3.5 - 5.0 g/dL   GFR, Estimated >60 >60 mL/min    Comment: (NOTE) Calculated using the CKD-EPI Creatinine Equation (2021)    Anion gap 9 5 - 15    Comment: Performed at [facility], [address] [place], [place] [address]   No results found.    Blood pressure (!) 167/90, pulse 96, temperature 98 F (36.7 C), resp. rate 14, height 5\' 5"  (1.651 m), weight 67.9 kg, SpO2 99 %.  Medical Problem List and Plan: 1. Functional deficits secondary to left MCA/ICA stroke with dense RUE HP and expressive aphasia  -patient may shower  -ELOS/Goals: 10-14 days, supervision PT, sup/min OT, min to mod assist SLP  -WHO for RUE 2.  Antithrombotics: -DVT/anticoagulation:  Pharmaceutical:  Lovenox  -antiplatelet therapy: DAPT X 3 months followed by ASA alone.  3. Pain Management: Tylenol prn.  4. Mood/Behavior/Sleep: LCSW to follow for evaluation and support.   -antipsychotic agents: N/A 5. Neuropsych/cognition: This patient is not fully capable of making decisions on his own behalf. 6. Skin/Wound Care: Routine pressure relief measures.  7. Fluids/Electrolytes/Nutrition: Monitor I/O. Check CMET in am for follow up on AKI/LFTs.  --Assist with/encourage honey thick fluid intake.  8. L-MCA infarct with hemorrhagic conversion: DAPT X 90 day followed by ASA alone 9. HTN: Monitor BP TID. BP remains labile. Resume Proscar.  --Continue Metoprolol--was titrated upwards 08/27 for better control.  10. Dysphagia: Continue D1, honey thick liquids. Needs assistance for feeding and supervision for safety.  --hypernatremia/AKI likely due to dysphagia diet 11. BPH: Monitor for any voiding difficulties. Will order PVR checks as off his meds.  --Proscar was not resumed. Flomax was d/c on 08/20.  12. Pre-renal azotemia: Offer  fluids between meals.  --Indicated thirst -->drank honey liquids without negative expression.  -supplement with IVF if needed 13. Fever/Leucocytosis: Was monitored and has resolved. Continues to have low grade elevation at nights.  --continue to monitor for any signs of infection/aspiration.  -- Recheck CBC tomorrow          Bary Leriche, PA-C 02/17/2022

## 2022-02-17 NOTE — PMR Pre-admission (Signed)
PMR Admission Coordinator Pre-Admission Assessment  Patient: Paul Bradshaw is an 88 y.o., male MRN: [id number] DOB: 1926-02-16 Height: 5\' 5"  (165.1 cm) Weight: 67.9 kg  Insurance Information HMO: yes    PPO:      PCP:      IPA:      80/20:      OTHER:  PRIMARY: [employer] Medicare      Policy#:      Subscriber: pt CM Name: [id number]      Phone#: [phone] option # 7     Fax#: [phone] Pre-Cert#: [id number] approved for 12 days until 9/8      Employer:  Benefits:  Phone #: [phone]     Name: 8/25 Eff. Date: 06/23/21     Deduct: none      Out of Pocket Max: $4500      Life Max: none CIR: $325 co pay per day days 1 until 6      SNF: no c copay days 1 until; 20; $196 co pay per day days 21 until 43; no c copay days 44 until 100 Outpatient: $20 per visit     Co-Pay: visits per medical neccesity Home Health: 100%      Co-Pay: visits per medical neccesity DME: 80%     Co-Pay: 20% Providers: in network  SECONDARY: none      Policy#:      Phone#:   08/21/21:       Phone#:   [employer]" for patients in Inpatient Rehabilitation Facilities with attached "Privacy Act Statement-Health Care Records" was provided and verbally reviewed with: Family  Emergency Contact Information Contact Information     Name Relation Home Work Mobile   [place] Son   [phone]   [phone] Other   [phone]   [phone]   [phone]   [phone] Granddaughter [phone]  [phone]   [phone]   [phone]      Current Medical History  Patient Admitting Diagnosis: CVA  History of Present Illness: 88 year old male with history of HTN, BPH, HLD, carotid disease, and chronic back pain. Presented to [facility] on 02/07/22 secondary to aphasia, inability to walk and right sided weakness.   CT head cytotoxic edema an suspected Left MCA CVA. CTA acute left LCA ischemia, near occlusion of left ICA origin and bulb, age  indeterminate but acute appearing thrombus in the left side found with severe stenosis and multiple MCA branch occlusions.  Neurology consulted. Due to patient age, discussed with son and decision made to treat the treatable,complete workup with out any aggressive interventions.  To continue ASA with Plavix for 90 days and then continue on ASA alone. Metoprolol for HTN and tachycardia. Palliative consulted for goals of care discussions. Made DNR.   Complete NIHSS TOTAL: 16  Patient's medical record from [facility] has been reviewed by the rehabilitation admission coordinator and physician.  Past Medical History  Past Medical History:  Diagnosis Date   Aortic regurgitation    Moderate   Arthritis    BPH (benign prostatic hyperplasia)    Cervical disc disease    Cervical radiculopathy    Hyperlipidemia    Hypertension    Prediabetes 02/16/2021   Staphylococcus aureus bacteremia 08/09/2012   TEE negative for vegetation or thrombus February 2014   Stroke [facility]) 2005 or 2006   3   Symptomatic carotid artery stenosis with infarction [facility]) 2005 or 2006   Status post right carotid endarterectomy   Has the  patient had major surgery during 100 days prior to admission? No  Family History   family history includes Alzheimer's disease in his mother; COPD in his son; Emphysema in his father; Heart attack in his brother; Heart disease in his brother.  Current Medications  Current Facility-Administered Medications:    [DISCONTINUED] acetaminophen (TYLENOL) tablet 650 mg, 650 mg, Oral, Q4H PRN **OR** acetaminophen (TYLENOL) 160 MG/5ML solution 650 mg, 650 mg, Per Tube, Q4H PRN **OR** acetaminophen (TYLENOL) suppository 650 mg, 650 mg, Rectal, Q4H PRN, Vassie Loll, MD, 650 mg at 02/13/22 1856   aspirin EC tablet 81 mg, 81 mg, Oral, Q breakfast, Emokpae, Courage, MD, 81 mg at 02/17/22 0953   atorvastatin (LIPITOR) tablet 10 mg, 10 mg, Oral, q1800, Vassie Loll, MD, 10 mg at 02/16/22 1717    brimonidine (ALPHAGAN) 0.2 % ophthalmic solution 1 drop, 1 drop, Both Eyes, TID, Vassie Loll, MD, 1 drop at 02/17/22 0954   clopidogrel (PLAVIX) tablet 75 mg, 75 mg, Oral, Daily, Vassie Loll, MD, 75 mg at 02/17/22 0953   heparin injection 5,000 Units, 5,000 Units, Subcutaneous, Q8H, Vassie Loll, MD, 5,000 Units at 02/17/22 0500   labetalol (NORMODYNE) injection 10 mg, 10 mg, Intravenous, Q4H PRN, Emokpae, Courage, MD, 10 mg at 02/14/22 0605   latanoprost (XALATAN) 0.005 % ophthalmic solution 1 drop, 1 drop, Both Eyes, QPM, Vassie Loll, MD, 1 drop at 02/16/22 1717   metoprolol tartrate (LOPRESSOR) tablet 25 mg, 25 mg, Oral, BID, Emokpae, Courage, MD, 25 mg at 02/17/22 5462   Oral care mouth rinse, 15 mL, Mouth Rinse, 4 times per day, Vassie Loll, MD, 15 mL at 02/17/22 1001   Oral care mouth rinse, 15 mL, Mouth Rinse, PRN, Vassie Loll, MD   pantoprazole (PROTONIX) injection 40 mg, 40 mg, Intravenous, Q24H, Vassie Loll, MD, 40 mg at 02/16/22 1322   senna-docusate (Senokot-S) tablet 1 tablet, 1 tablet, Oral, QHS PRN, Vassie Loll, MD  Patients Current Diet:  Diet Order             DIET - DYS 1 Room service appropriate? Yes; Fluid consistency: Honey Thick  Diet effective now                  Precautions / Restrictions Precautions Precautions: Fall Restrictions Weight Bearing Restrictions: No   Has the patient had 2 or more falls or a fall with injury in the past year? 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 Independent  Indoor Mobility: Did the patient need assistance with walking from room to room (with or without device)? Independent  Stairs: Did the patient need assistance with internal or external stairs (with or without device)? Independent  Functional Cognition: Did the patient need help planning  regular tasks such as shopping or remembering to take medications? Independent  Patient Information Are you of Hispanic, Latino/a,or Spanish origin?: A. No, not of Hispanic, Latino/a, or Spanish origin What is your race?: A. White Do you need or want an interpreter to communicate with a doctor or health care staff?: 0. No  Patient's Response To:  Health Literacy and Transportation Is the patient able to respond to health literacy and transportation needs?: No Health Literacy - How often do you need to have someone help you when you read instructions, pamphlets, or other written material from your doctor or pharmacy?: Patient unable to respond In the past 12  months, has lack of transportation kept you from medical appointments or from getting medications?: No In the past 12 months, has lack of transportation kept you from meetings, work, or from getting things needed for daily living?: No  Journalist, newspaper / Equipment Home Equipment: Gilmer Mor - single point, Information systems manager, Agricultural consultant (2 wheels)  Prior Device Use: Indicate devices/aids used by the patient prior to current illness, exacerbation or injury? RW prn  Current Functional Level Cognition  Arousal/Alertness: Awake/alert Overall Cognitive Status: Impaired/Different from baseline Orientation Level: Other (comment) (unable to assess, non verbal) General Comments: Pt able to follow commands with verbal cuing, tactile cuing, and modeling.    Extremity Assessment (includes Sensation/Coordination)  Upper Extremity Assessment: RUE deficits/detail RUE Deficits / Details: Flaccid R UE. No tone. Pt unable to report on sensation but likely limited. RUE Coordination: decreased fine motor, decreased gross motor  Lower Extremity Assessment: Defer to PT evaluation RLE Deficits / Details: minimal movement in supine only being able to plantarflex/dorsiflex foot in minimal range; in seated EOB able to complete knee extension 4/5 MMT     ADLs  Overall ADL's : Needs assistance/impaired Eating/Feeding: Set up, Minimal assistance, Sitting Eating/Feeding Details (indicate cue type and reason): Pt struggled to feed himself if pieces where not very small as seen by missing his mouth to his R side. Grooming: Sitting, Moderate assistance, Maximal assistance Upper Body Bathing: Moderate assistance, Maximal assistance, Sitting Lower Body Bathing: Maximal assistance, Total assistance, Sitting/lateral leans Upper Body Dressing : Moderate assistance, Maximal assistance, Sitting Lower Body Dressing: Maximal assistance, Total assistance, Sitting/lateral leans Toilet Transfer: Moderate assistance, Maximal assistance, Stand-pivot Toilet Transfer Details (indicate cue type and reason): Partially simulated via sit to stand from EOB. Pt unable to take steps to L side. Toileting- Clothing Manipulation and Hygiene: Total assistance, Bed level Tub/ Shower Transfer: Maximal assistance, Stand-pivot, Tub bench Functional mobility during ADLs: Moderate assistance (hemi-walker) General ADL Comments: Pt able to ambulate to doorway with hemi-walker. Slow labored movement and assist to keep R UE supported.    Mobility  Overal bed mobility: Needs Assistance Bed Mobility: Supine to Sit Supine to sit: Mod assist, HOB elevated Sit to supine: Mod assist, Max assist General bed mobility comments: Pt able to pull to sit with Min to mod A but more assist needed to scoot to EOB.    Transfers  Overall transfer level: Needs assistance Equipment used: Hemi-walker Transfers: Sit to/from Stand, Bed to chair/wheelchair/BSC Sit to Stand: Mod assist, Min assist Bed to/from chair/wheelchair/BSC transfer type:: Step pivot Step pivot transfers: Mod assist General transfer comment: Hemi-walker used with therapist's on either side. min/mod assist to power up to standing, cueing and demonstration of proper hemiwalker use    Ambulation / Gait / Stairs / Wheelchair  Mobility  Ambulation/Gait Ambulation/Gait assistance: Min assist, Mod assist Gait Distance (Feet): 20 Feet Assistive device: Hemi-walker Gait Pattern/deviations: Step-to pattern, Trunk flexed General Gait Details: slow, labored steps from bed to chair initially; after a short seated rest break patient able to ambulate increased distance with use of hemiwalker and assist for balance, gait speed improves with time Gait velocity: decreased    Posture / Balance Dynamic Sitting Balance Sitting balance - Comments: good/fair seated EOB Balance Overall balance assessment: Needs assistance Sitting-balance support: Single extremity supported, Feet supported Sitting balance-Leahy Scale: Good Sitting balance - Comments: good/fair seated EOB Standing balance support: During functional activity, Reliant on assistive device for balance, Single extremity supported Standing balance-Leahy Scale: Poor Standing balance comment: fair/poor with  hemiwalker    Special needs/care consideration DNR on acute Palliative for goals of care on acute   Previous Home Environment  Living Arrangements: Spouse/significant other  Lives With: Spouse Available Help at Discharge: Family, Available 24 hours/day (son, Casimiro NeedleMichael and his friend, [place] MutaDiane, to be caregivers) Type of Home: House Home Layout: One level Home Access: Stairs to enter, Ramped entrance Entrance Stairs-Rails: Right Entrance Stairs-Number of Steps: 6 Bathroom Shower/Tub: Engineer, manufacturing systemsTub/shower unit Bathroom Toilet: Standard How Accessible: Accessible via walker Home Care Services: No Additional Comments: info taken from document review  Discharge Living Setting Plans for Discharge Living Setting: Apartment, Lives with (comment) (will live with Casimiro NeedleMichael, son and his friend, [name]) Type of Home at Discharge: Apartment Discharge Home Layout: One level Discharge Home Access: Level entry Discharge Bathroom Shower/Tub: Tub/shower unit Discharge Bathroom Toilet:  Standard Discharge Bathroom Accessibility: Yes How Accessible: Accessible via walker Does the patient have any problems obtaining your medications?: No  Son's address:  [address] [place], [place] [address]  Social/Family/Support Systems Patient Roles: Parent, Financial risk analystCaregiver Contact Information: son, Casimiro NeedleMichael Anticipated Caregiver: son, Casimiro NeedleMichael and his friend, [name] Anticipated Industrial/product designerCaregiver's Contact Information: see contacts Caregiver Availability: 24/7 Discharge Plan Discussed with Primary Caregiver: Yes Is Caregiver In Agreement with Plan?: Yes Does Caregiver/Family have Issues with Lodging/Transportation while Pt is in Rehab?: No  Patient was caregiver for his wife pta. Wife's daughter now caring for her. APS was contacted per son, Casimiro NeedleMichael , since patient admitted for her caregivers have issues  Goals Patient/Family Goal for Rehab: supervision PT, supervision to min OT, supervision SLP Expected length of stay: ELOS 10 to 14 days Additional Information: Palliative for goals of care on acute; DNR on acute Pt/Family Agrees to Admission and willing to participate: Yes Program Orientation Provided & Reviewed with Pt/Caregiver Including Roles  & Responsibilities: Yes  Decrease burden of Care through IP rehab admission: n/a  Possible need for SNF placement upon discharge: not anticipated. I told son that he would have to discharge home with him and his friend, Sedalia MutaDiane, to their apartment.  Patient Condition: [facility] spoken with  son. I discussed via phone for inpatient rehabilitation assessment.  Patient will benefit from ongoing PT, OT, and SLP, can actively participate in 3 hours of therapy a day 5 days of the week, and can make measurable gains during the admission.  Patient will also benefit from the coordinated team approach during an Inpatient Acute Rehabilitation admission.  The patient will receive intensive therapy as well as Rehabilitation physician,  nursing, social worker, and care management interventions.  Due to bladder management, bowel management, safety, skin/wound care, disease management, medication administration, pain management, and patient education the patient requires 24 hour a day rehabilitation nursing.  The patient is currently mod assist overall with mobility and basic ADLs.  Discharge setting and therapy post discharge at home with home health is anticipated.  Patient has agreed to participate in the Acute Inpatient Rehabilitation Program and will admit today.  Preadmission Screen Completed By:  Clois DupesBoyette, Barbara Godwin, 02/17/2022 10:24 AM ______________________________________________________________________   Discussed status with Dr. Riley KillSwartz on 02/17/22 at 1024 and received approval for admission today.  Admission Coordinator:  Clois DupesBoyette, Barbara Godwin, RN, time 1024 Date 02/17/22   Assessment/Plan: Diagnosis: Left ICA/MCA infarcts Does the need for close, 24 hr/day Medical supervision in concert with the patient's rehab needs make it unreasonable for this patient to be served in a less intensive setting? Yes Co-Morbidities requiring supervision/potential complications: HTN,  BPH, cLBP Due to bladder management, bowel management, safety, skin/wound care, disease management, medication administration, pain management, and patient education, does the patient require 24 hr/day rehab nursing? Yes Does the patient require coordinated care of a physician, rehab nurse, PT, OT, and SLP to address physical and functional deficits in the context of the above medical diagnosis(es)? Yes Addressing deficits in the following areas: balance, endurance, locomotion, strength, transferring, bowel/bladder control, bathing, dressing, feeding, grooming, toileting, cognition, speech, language, swallowing, and psychosocial support Can the patient actively participate in an intensive therapy program of at least 3 hrs of therapy 5 days a week? Yes The  potential for patient to make measurable gains while on inpatient rehab is excellent Anticipated functional outcomes upon discharge from inpatient rehab: supervision PT, supervision and min assist OT, supervision SLP Estimated rehab length of stay to reach the above functional goals is: 10-14 days Anticipated discharge destination: Home 10. Overall Rehab/Functional Prognosis: excellent   MD Signature: Ranelle Oyster, MD, [facility] Physical Medicine & Rehabilitation Medical Director Rehabilitation Services 02/17/2022

## 2022-02-17 NOTE — Progress Notes (Signed)
INPATIENT REHABILITATION ADMISSION NOTE   Arrival Method: EMS     Mental Orientation: oriented to self   Assessment: done   Skin: assessed   IV'S: removed   Pain: none   Tubes and Drains: none   Safety Measures: reviewed with pt   Vital Signs: done   Height and Weight: done   Rehab Orientation: done   Family: Not present at this time   Notes:  done  Marylu Lund, RN

## 2022-02-17 NOTE — Progress Notes (Signed)
Inpatient Rehabilitation Admissions Coordinator   I have Insurance approval and Cir bed at [facility] in [place] for today. Dr Riley Kill will admit him to room 4 midwest 1. I have contacted [facility] and scheduled pickup for  12 noon. I contacted his son, Casimiro Needle and he is aware and in agreement. I contacted Dr Mariea Clonts for medical clearance to admit. I have updated acute team and TOC.  Ottie Glazier, RN, MSN Rehab Admissions Coordinator [phone] 02/17/2022 10:44 AM

## 2022-02-17 NOTE — Progress Notes (Signed)
Ranelle OysterSwartz, Zachary T, MD  Physician Physical Medicine and Rehabilitation PMR Pre-admission     Signed Date of Service:  02/17/2022  9:09 AM  Related encounter: ED to Hosp-Admission (Discharged) [facility]   Signed      PMR Admission Coordinator Pre-Admission Assessment   Patient: Paul Bradshaw is an 88 y.o., male MRN: [id number] DOB: 05-25-1926 Height: 5\' 5"  (165.1 cm) Weight: 67.9 kg   Insurance Information HMO: yes    PPO:      PCP:      IPA:      80/20:      OTHER:  PRIMARY: [employer] Medicare      Policy#: [id number]      Subscriber: pt CM Name: Mitzi DavenportShelby      Phone#: [phone] option # 7     Fax#: [phone] Pre-Cert#: [id number] approved for 12 days until 9/8      Employer:  Benefits:  Phone #: [phone]     Name: 8/25 Eff. Date: 06/23/21     Deduct: none      Out of Pocket Max: $4500      Life Max: none CIR: $325 co pay per day days 1 until 6      SNF: no c copay days 1 until; 20; $196 co pay per day days 21 until 43; no c copay days 44 until 100 Outpatient: $20 per visit     Co-Pay: visits per medical neccesity Home Health: 100%      Co-Pay: visits per medical neccesity DME: 80%     Co-Pay: 20% Providers: in network  SECONDARY: none      Policy#:      Phone#:    Artistinancial Counselor:       Phone#:    The Data processing manager"Data Collection Information Summary" for patients in Inpatient Rehabilitation Facilities with attached "Privacy Act Statement-Health Care Records" was provided and verbally reviewed with: Family   Emergency Contact Information Contact Information       Name Relation Home Work Mobile    WilhoitOneal,Michael Son     [phone]    Cristy FriedlanderFoley,Doug Other     [phone]    Lamar Sprinklesodd,Becky Granddaughter     [phone]    Margarito CourserKing,Penny Granddaughter [phone]   [phone]    Cristy FriedlanderKing,Cody Grandson     [phone]         Current Medical History  Patient Admitting Diagnosis: CVA   History of Present Illness: 88 year old male with  history of HTN, BPH, HLD, carotid disease, and chronic back pain. Presented to [facility] on 02/07/22 secondary to aphasia, inability to walk and right sided weakness.    CT head cytotoxic edema an suspected Left MCA CVA. CTA acute left LCA ischemia, near occlusion of left ICA origin and bulb, age indeterminate but acute appearing thrombus in the left side found with severe stenosis and multiple MCA branch occlusions.  Neurology consulted. Due to patient age, discussed with son and decision made to treat the treatable,complete workup with out any aggressive interventions.   To continue ASA with Plavix for 90 days and then continue on ASA alone. Metoprolol for HTN and tachycardia. Palliative consulted for goals of care discussions. Made DNR.    Complete NIHSS TOTAL: 16   Patient's medical record from [facility] has been reviewed by the rehabilitation admission coordinator and physician.   Past Medical History      Past Medical History:  Diagnosis Date   Aortic regurgitation  Moderate   Arthritis     BPH (benign prostatic hyperplasia)     Cervical disc disease     Cervical radiculopathy     Hyperlipidemia     Hypertension     Prediabetes 02/16/2021   Staphylococcus aureus bacteremia 08/09/2012    TEE negative for vegetation or thrombus February 2014   Stroke [facility]) 2005 or 2006    3   Symptomatic carotid artery stenosis with infarction [facility]) 2005 or 2006    Status post right carotid endarterectomy    Has the patient had major surgery during 100 days prior to admission? No   Family History   family history includes Alzheimer's disease in his mother; COPD in his son; Emphysema in his father; Heart attack in his brother; Heart disease in his brother.   Current Medications   Current Facility-Administered Medications:    [DISCONTINUED] acetaminophen (TYLENOL) tablet 650 mg, 650 mg, Oral, Q4H PRN **OR** acetaminophen (TYLENOL) 160 MG/5ML solution 650 mg, 650 mg, Per Tube, Q4H PRN **OR**  acetaminophen (TYLENOL) suppository 650 mg, 650 mg, Rectal, Q4H PRN, Vassie Loll, MD, 650 mg at 02/13/22 1856   aspirin EC tablet 81 mg, 81 mg, Oral, Q breakfast, Emokpae, Courage, MD, 81 mg at 02/17/22 0953   atorvastatin (LIPITOR) tablet 10 mg, 10 mg, Oral, q1800, Vassie Loll, MD, 10 mg at 02/16/22 1717   brimonidine (ALPHAGAN) 0.2 % ophthalmic solution 1 drop, 1 drop, Both Eyes, TID, Vassie Loll, MD, 1 drop at 02/17/22 0954   clopidogrel (PLAVIX) tablet 75 mg, 75 mg, Oral, Daily, Vassie Loll, MD, 75 mg at 02/17/22 0953   heparin injection 5,000 Units, 5,000 Units, Subcutaneous, Q8H, Vassie Loll, MD, 5,000 Units at 02/17/22 0500   labetalol (NORMODYNE) injection 10 mg, 10 mg, Intravenous, Q4H PRN, Emokpae, Courage, MD, 10 mg at 02/14/22 0605   latanoprost (XALATAN) 0.005 % ophthalmic solution 1 drop, 1 drop, Both Eyes, QPM, Vassie Loll, MD, 1 drop at 02/16/22 1717   metoprolol tartrate (LOPRESSOR) tablet 25 mg, 25 mg, Oral, BID, Emokpae, Courage, MD, 25 mg at 02/17/22 0454   Oral care mouth rinse, 15 mL, Mouth Rinse, 4 times per day, Vassie Loll, MD, 15 mL at 02/17/22 1001   Oral care mouth rinse, 15 mL, Mouth Rinse, PRN, Vassie Loll, MD   pantoprazole (PROTONIX) injection 40 mg, 40 mg, Intravenous, Q24H, Vassie Loll, MD, 40 mg at 02/16/22 1322   senna-docusate (Senokot-S) tablet 1 tablet, 1 tablet, Oral, QHS PRN, Vassie Loll, MD   Patients Current Diet:  Diet Order                  DIET - DYS 1 Room service appropriate? Yes; Fluid consistency: Honey Thick  Diet effective now                       Precautions / Restrictions Precautions Precautions: Fall Restrictions Weight Bearing Restrictions: No    Has the patient had 2 or more falls or a fall with injury in the past year? 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 Independent   Indoor Mobility: Did the patient need assistance with walking from room to room (with or without device)? Independent   Stairs: Did the patient need assistance with internal or external stairs (with or without device)? Independent   Functional Cognition: Did the patient  need help planning regular tasks such as shopping or remembering to take medications? Independent   Patient Information Are you of Hispanic, Latino/a,or Spanish origin?: A. No, not of Hispanic, Latino/a, or Spanish origin What is your race?: A. White Do you need or want an interpreter to communicate with a doctor or health care staff?: 0. No   Patient's Response To:  Health Literacy and Transportation Is the patient able to respond to health literacy and transportation needs?: No Health Literacy - How often do you need to have someone help you when you read instructions, pamphlets, or other written material from your doctor or pharmacy?: Patient unable to respond In the past 12 months, has lack of transportation kept you from medical appointments or from getting medications?: No In the past 12 months, has lack of transportation kept you from meetings, work, or from getting things needed for daily living?: No   Journalist, newspaper / Equipment Home Equipment: Gilmer Mor - single point, Information systems manager, Agricultural consultant (2 wheels)   Prior Device Use: Indicate devices/aids used by the patient prior to current illness, exacerbation or injury? RW prn   Current Functional Level Cognition   Arousal/Alertness: Awake/alert Overall Cognitive Status: Impaired/Different from baseline Orientation Level: Other (comment) (unable to assess, non verbal) General Comments: Pt able to follow commands with verbal cuing, tactile cuing, and modeling.    Extremity Assessment (includes Sensation/Coordination)   Upper Extremity Assessment: RUE deficits/detail RUE Deficits / Details: Flaccid R UE. No tone. Pt unable  to report on sensation but likely limited. RUE Coordination: decreased fine motor, decreased gross motor  Lower Extremity Assessment: Defer to PT evaluation RLE Deficits / Details: minimal movement in supine only being able to plantarflex/dorsiflex foot in minimal range; in seated EOB able to complete knee extension 4/5 MMT     ADLs   Overall ADL's : Needs assistance/impaired Eating/Feeding: Set up, Minimal assistance, Sitting Eating/Feeding Details (indicate cue type and reason): Pt struggled to feed himself if pieces where not very small as seen by missing his mouth to his R side. Grooming: Sitting, Moderate assistance, Maximal assistance Upper Body Bathing: Moderate assistance, Maximal assistance, Sitting Lower Body Bathing: Maximal assistance, Total assistance, Sitting/lateral leans Upper Body Dressing : Moderate assistance, Maximal assistance, Sitting Lower Body Dressing: Maximal assistance, Total assistance, Sitting/lateral leans Toilet Transfer: Moderate assistance, Maximal assistance, Stand-pivot Toilet Transfer Details (indicate cue type and reason): Partially simulated via sit to stand from EOB. Pt unable to take steps to L side. Toileting- Clothing Manipulation and Hygiene: Total assistance, Bed level Tub/ Shower Transfer: Maximal assistance, Stand-pivot, Tub bench Functional mobility during ADLs: Moderate assistance (hemi-walker) General ADL Comments: Pt able to ambulate to doorway with hemi-walker. Slow labored movement and assist to keep R UE supported.     Mobility   Overal bed mobility: Needs Assistance Bed Mobility: Supine to Sit Supine to sit: Mod assist, HOB elevated Sit to supine: Mod assist, Max assist General bed mobility comments: Pt able to pull to sit with Min to mod A but more assist needed to scoot to EOB.     Transfers   Overall transfer level: Needs assistance Equipment used: Hemi-walker Transfers: Sit to/from Stand, Bed to chair/wheelchair/BSC Sit to  Stand: Mod assist, Min assist Bed to/from chair/wheelchair/BSC transfer type:: Step pivot Step pivot transfers: Mod assist General transfer comment: Hemi-walker used with therapist's on either side. min/mod assist to power up to standing, cueing and demonstration of proper hemiwalker use     Ambulation / Gait / Stairs /  Wheelchair Mobility   Ambulation/Gait Ambulation/Gait assistance: Min assist, Mod assist Gait Distance (Feet): 20 Feet Assistive device: Hemi-walker Gait Pattern/deviations: Step-to pattern, Trunk flexed General Gait Details: slow, labored steps from bed to chair initially; after a short seated rest break patient able to ambulate increased distance with use of hemiwalker and assist for balance, gait speed improves with time Gait velocity: decreased     Posture / Balance Dynamic Sitting Balance Sitting balance - Comments: good/fair seated EOB Balance Overall balance assessment: Needs assistance Sitting-balance support: Single extremity supported, Feet supported Sitting balance-Leahy Scale: Good Sitting balance - Comments: good/fair seated EOB Standing balance support: During functional activity, Reliant on assistive device for balance, Single extremity supported Standing balance-Leahy Scale: Poor Standing balance comment: fair/poor with hemiwalker     Special needs/care consideration DNR on acute Palliative for goals of care on acute    Previous Home Environment  Living Arrangements: Spouse/significant other  Lives With: Spouse Available Help at Discharge: Family, Available 24 hours/day (son, Paul Bradshaw and his friend, Sedalia Muta, to be caregivers) Type of Home: House Home Layout: One level Home Access: Stairs to enter, Ramped entrance Entrance Stairs-Rails: Right Entrance Stairs-Number of Steps: 6 Bathroom Shower/Tub: Engineer, manufacturing systems: Standard How Accessible: Accessible via walker Home Care Services: No Additional Comments: info taken from document  review   Discharge Living Setting Plans for Discharge Living Setting: Apartment, Lives with (comment) (will live with Paul Bradshaw, son and his friend, [name]) Type of Home at Discharge: Apartment Discharge Home Layout: One level Discharge Home Access: Level entry Discharge Bathroom Shower/Tub: Tub/shower unit Discharge Bathroom Toilet: Standard Discharge Bathroom Accessibility: Yes How Accessible: Accessible via walker Does the patient have any problems obtaining your medications?: No   Son's address:  [address] [place], [place] [address]   Social/Family/Support Systems Patient Roles: Parent, Financial risk analyst Information: son, Paul Bradshaw Anticipated Caregiver: son, Paul Bradshaw and his friend, [name] Anticipated Industrial/product designer Information: see contacts Caregiver Availability: 24/7 Discharge Plan Discussed with Primary Caregiver: Yes Is Caregiver In Agreement with Plan?: Yes Does Caregiver/Family have Issues with Lodging/Transportation while Pt is in Rehab?: No   Patient was caregiver for his wife pta. Wife's daughter now caring for her. APS was contacted per son, Paul Bradshaw , since patient admitted for her caregivers have issues   Goals Patient/Family Goal for Rehab: supervision PT, supervision to min OT, supervision SLP Expected length of stay: ELOS 10 to 14 days Additional Information: Palliative for goals of care on acute; DNR on acute Pt/Family Agrees to Admission and willing to participate: Yes Program Orientation Provided & Reviewed with Pt/Caregiver Including Roles  & Responsibilities: Yes   Decrease burden of Care through IP rehab admission: n/a   Possible need for SNF placement upon discharge: not anticipated. I told son that he would have to discharge home with him and his friend, Sedalia Muta, to their apartment.   Patient Condition: I have reviewed medical records from [facility], spoken with  son. I discussed via phone for inpatient rehabilitation assessment.  Patient will benefit  from ongoing PT, OT, and SLP, can actively participate in 3 hours of therapy a day 5 days of the week, and can make measurable gains during the admission.  Patient will also benefit from the coordinated team approach during an Inpatient Acute Rehabilitation admission.  The patient will receive intensive therapy as well as Rehabilitation physician, nursing, social worker, and care management interventions.  Due to bladder management, bowel management, safety, skin/wound care, disease management, medication administration, pain management, and  patient education the patient requires 24 hour a day rehabilitation nursing.  The patient is currently mod assist overall with mobility and basic ADLs.  Discharge setting and therapy post discharge at home with home health is anticipated.  Patient has agreed to participate in the Acute Inpatient Rehabilitation Program and will admit today.   Preadmission Screen Completed By:  Clois Dupes, 02/17/2022 10:24 AM ______________________________________________________________________   Discussed status with Dr. Riley Kill on 02/17/22 at 1024 and received approval for admission today.   Admission Coordinator:  Clois Dupes, RN, time 1024 Date 02/17/22    Assessment/Plan: Diagnosis: Left ICA/MCA infarcts Does the need for close, 24 hr/day Medical supervision in concert with the patient's rehab needs make it unreasonable for this patient to be served in a less intensive setting? Yes Co-Morbidities requiring supervision/potential complications: HTN, BPH, cLBP Due to bladder management, bowel management, safety, skin/wound care, disease management, medication administration, pain management, and patient education, does the patient require 24 hr/day rehab nursing? Yes Does the patient require coordinated care of a physician, rehab nurse, PT, OT, and SLP to address physical and functional deficits in the context of the above medical diagnosis(es)? Yes Addressing  deficits in the following areas: balance, endurance, locomotion, strength, transferring, bowel/bladder control, bathing, dressing, feeding, grooming, toileting, cognition, speech, language, swallowing, and psychosocial support Can the patient actively participate in an intensive therapy program of at least 3 hrs of therapy 5 days a week? Yes The potential for patient to make measurable gains while on inpatient rehab is excellent Anticipated functional outcomes upon discharge from inpatient rehab: supervision PT, supervision and min assist OT, supervision SLP Estimated rehab length of stay to reach the above functional goals is: 10-14 days Anticipated discharge destination: Home 10. Overall Rehab/Functional Prognosis: excellent     MD Signature: Ranelle Oyster, MD, [facility] Physical Medicine & Rehabilitation Medical Director Rehabilitation Services 02/17/2022          Revision History                                Note Details  Author Ranelle Oyster, MD File Time 02/17/2022 10:36 AM  Author Type Physician Status Signed  Last Editor Ranelle Oyster, MD Service Physical Medicine and Rehabilitation  Hospital Acct # [id number] Admit Date 02/17/2022

## 2022-02-17 NOTE — H&P (Signed)
Physical Medicine and Rehabilitation Admission H&P        Chief Complaint  Patient presents with   Functional deficits due to stroke.       HPI:  Paul Bradshaw is a 88 year old male with history of HTN, BPH, pre-diabetes, chronic back pain with radiculopathy who was admitted to [facility] on 02/07/22 with right sided weakness and inability to talk. He was found to have acute L-MCA stroke with  core infarct relative to penumbra and near occlusion of L-ICA  with acute thrombus left siphon with severe stenosis and multiple MCA branch occlusions. Patient felt to be  poor candidate for intervention. Family elected on DNR and did not want to pursue aggressive interventions. 2D echo done revealing EF 65-70% with V He was started on D1, nectar which was down graded to honey on 08/24 due to premature spillage with delay in swallow initiation.    Neurology recommends DAPT X 3 months followed by ASA alone.  He has had fevers which is resolving with improvement in leucocytosis. He was noted to be stable neurologically and is showing improvement in alertness but continues to be limited by RUE weakness, right facial droop and expressive> receptive aphasia. CIR recommended due to functional decline.      Review of Systems  Unable to perform ROS: Language            Past Medical History:  Diagnosis Date   Aortic regurgitation      Moderate   Arthritis     BPH (benign prostatic hyperplasia)     Cervical disc disease     Cervical radiculopathy     Hyperlipidemia     Hypertension     Prediabetes 02/16/2021   Staphylococcus aureus bacteremia 08/09/2012    TEE negative for vegetation or thrombus February 2014   Stroke [facility]) 2005 or 2006    3   Symptomatic carotid artery stenosis with infarction [facility]) 2005 or 2006    Status post right carotid endarterectomy         Past Surgical History:  Procedure Laterality Date   BACK SURGERY       CAROTID ENDARTERECTOMY       CATARACT EXTRACTION W/PHACO  Left 02/15/2015    Procedure: CATARACT EXTRACTION PHACO AND INTRAOCULAR LENS PLACEMENT LEFT EYE CDE=30.93;  Surgeon: Gemma Payor, MD;  Location: [facility] ORS;  Service: Ophthalmology;  Laterality: Left;   CHOLECYSTECTOMY       EYE SURGERY       KIDNEY STONE SURGERY       LUMBAR LAMINECTOMY/DECOMPRESSION MICRODISCECTOMY Bilateral 01/23/2014    Procedure: LUMBAR LAMINECTOMY/DECOMPRESSION MICRODISCECTOMY 1 LEVEL L5-S1;  Surgeon: Temple Pacini, MD;  Location: [facility] NEURO ORS;  Service: Neurosurgery;  Laterality: Bilateral;  LUMBAR LAMINECTOMY/DECOMPRESSION MICRODISCECTOMY 1 LEVEL L5-S1   TEE WITHOUT CARDIOVERSION N/A 08/12/2012    Procedure: TRANSESOPHAGEAL ECHOCARDIOGRAM (TEE);  Surgeon: Wendall Stade, MD;  Location: [facility] ENDO SUITE;  Service: Cardiovascular;  Laterality: N/A;   TEE WITHOUT CARDIOVERSION N/A 06/24/2013    Procedure: TRANSESOPHAGEAL ECHOCARDIOGRAM (TEE);  Surgeon: Jonelle Sidle, MD;  Location: [facility] ENDO SUITE;  Service: Endoscopy;  Laterality: N/A;         Family History  Problem Relation Age of Onset   Emphysema Father     Alzheimer's disease Mother     Heart disease Brother     Heart attack Brother     COPD Son        Social History: Married to  second wife for 30 years. 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 Per  reports that he has never smoked. He has never used smokeless tobacco. Per reports that he does not drink alcohol and does not use drugs.          Allergies  Allergen Reactions   Amlodipine Swelling      Swelling of lips.   Lisinopril Other (See Comments)      Elevated BP higher & causes a burning feeling on the inside.             Medications Prior to Admission  Medication Sig Dispense Refill   albuterol (VENTOLIN HFA) 108 (90 Base) MCG/ACT inhaler Inhale 2 puffs into the lungs every 6 (six) hours as needed for shortness of breath or wheezing. 18 g 0   atorvastatin (LIPITOR)  10 MG tablet TAKE ONE-HALF TABLET BY  MOUTH DAILY AT 6 PM 45 tablet 1   brimonidine (ALPHAGAN) 0.2 % ophthalmic solution Place 1 drop into both eyes 3 (three) times daily.       cetirizine (ZYRTEC) 10 MG tablet Take 10 mg by mouth daily.       clopidogrel (PLAVIX) 75 MG tablet TAKE 1 TABLET BY MOUTH  DAILY WITH BREAKFAST 90 tablet 1   dorzolamide-timolol (COSOPT) 22.3-6.8 MG/ML ophthalmic solution Place 1 drop into both eyes 2 (two) times daily.       finasteride (PROSCAR) 5 MG tablet Take 1 tablet (5 mg total) by mouth daily. 90 tablet 3   HYDROcodone-acetaminophen (NORCO) 7.5-325 MG tablet Take 1 tablet by mouth every 6 (six) hours as needed for moderate pain. 90 tablet 0   latanoprost (XALATAN) 0.005 % ophthalmic solution Place 1 drop into both eyes every evening.       Multiple Vitamins-Minerals (MULTIVITAMIN PO) Take 1 tablet by mouth daily.       tamsulosin (FLOMAX) 0.4 MG CAPS capsule TAKE 1 CAPSULE BY MOUTH IN  THE EVENING 90 capsule 1   Turmeric 1053 MG TABS Take 1 tablet by mouth daily.       acetaminophen (TYLENOL) 500 MG tablet Take 1,000 mg by mouth every 8 (eight) hours as needed for moderate pain.       HYDROcodone-acetaminophen (NORCO) 7.5-325 MG tablet Take 1 tablet by mouth every 6 (six) hours as needed for moderate pain. (Patient not taking: Reported on 02/07/2022) 90 tablet 0   [START ON 03/15/2022] HYDROcodone-acetaminophen (NORCO) 7.5-325 MG tablet Take 1 tablet by mouth every 6 (six) hours as needed for moderate pain. (Patient not taking: Reported on 02/07/2022) 90 tablet 0          Home: Home Living Family/patient expects to be discharged to:: Private residence Living Arrangements: Spouse/significant other Available Help at Discharge: Family, Available 24 hours/day (son, Casimiro Needle and his friend, Sedalia Muta, to be caregivers) Type of Home: House Home Access: Stairs to enter, Ramped entrance Secretary/administrator of Steps: 6 Entrance Stairs-Rails: Right Home Layout: One  level Bathroom Shower/Tub: Engineer, manufacturing systems: Standard Home Equipment: [employer] - single point, Information systems manager, Agricultural consultant (2 wheels) Additional Comments: info taken from document review  Lives With: Spouse   Functional History: Prior Function Prior Level of Function : Independent/Modified Independent Mobility Comments: Household and short distanced community ambulator using RW PRN ADLs Comments: ind with ADLs/IADLs, cares for disabled spouse   Functional Status:  Mobility: Bed Mobility Overal bed mobility: Needs Assistance Bed  Mobility: Supine to Sit Supine to sit: Mod assist, HOB elevated Sit to supine: Mod assist, Max assist General bed mobility comments: Pt able to pull to sit with Min to mod A but more assist needed to scoot to EOB. Transfers Overall transfer level: Needs assistance Equipment used: Hemi-walker Transfers: Sit to/from Stand, Bed to chair/wheelchair/BSC Sit to Stand: Mod assist, Min assist Bed to/from chair/wheelchair/BSC transfer type:: Step pivot Step pivot transfers: Mod assist General transfer comment: Hemi-walker used with therapist's on either side. min/mod assist to power up to standing, cueing and demonstration of proper hemiwalker use Ambulation/Gait Ambulation/Gait assistance: Min assist, Mod assist Gait Distance (Feet): 20 Feet Assistive device: Hemi-walker Gait Pattern/deviations: Step-to pattern, Trunk flexed General Gait Details: slow, labored steps from bed to chair initially; after a short seated rest break patient able to ambulate increased distance with use of hemiwalker and assist for balance, gait speed improves with time Gait velocity: decreased   ADL: ADL Overall ADL's : Needs assistance/impaired Eating/Feeding: Set up, Minimal assistance, Sitting Eating/Feeding Details (indicate cue type and reason): Pt struggled to feed himself if pieces where not very small as seen by missing his mouth to his R side. Grooming: Sitting,  Moderate assistance, Maximal assistance Upper Body Bathing: Moderate assistance, Maximal assistance, Sitting Lower Body Bathing: Maximal assistance, Total assistance, Sitting/lateral leans Upper Body Dressing : Moderate assistance, Maximal assistance, Sitting Lower Body Dressing: Maximal assistance, Total assistance, Sitting/lateral leans Toilet Transfer: Moderate assistance, Maximal assistance, Stand-pivot Toilet Transfer Details (indicate cue type and reason): Partially simulated via sit to stand from EOB. Pt unable to take steps to L side. Toileting- Clothing Manipulation and Hygiene: Total assistance, Bed level Tub/ Shower Transfer: Maximal assistance, Stand-pivot, Tub bench Functional mobility during ADLs: Moderate assistance (hemi-walker) General ADL Comments: Pt able to ambulate to doorway with hemi-walker. Slow labored movement and assist to keep R UE supported.   Cognition: Cognition Overall Cognitive Status: Impaired/Different from baseline Arousal/Alertness: Awake/alert Orientation Level: Other (comment) (unable to assess, non verbal) Cognition Arousal/Alertness: Awake/alert Behavior During Therapy: WFL for tasks assessed/performed, Flat affect Overall Cognitive Status: Impaired/Different from baseline General Comments: Pt able to follow commands with verbal cuing, tactile cuing, and modeling.     Blood pressure (!) 167/90, pulse 96, temperature 98 F (36.7 C), resp. rate 14, height 5\' 5"  (1.651 m), weight 67.9 kg, SpO2 99 %. Physical Exam Vitals and nursing note reviewed.  Constitutional:      General: He is not in acute distress.    Appearance: Normal appearance. He is not ill-appearing.  HENT:     Head: Normocephalic and atraumatic.     Right Ear: External ear normal.     Left Ear: External ear normal.     Nose: Nose normal.     Mouth/Throat:     Mouth: Mucous membranes are moist.  Eyes:     Extraocular Movements: Extraocular movements intact.     Pupils: Pupils  are equal, round, and reactive to light.  Cardiovascular:     Rate and Rhythm: Normal rate and regular rhythm.     Heart sounds: No murmur heard.    No gallop.  Pulmonary:     Effort: Pulmonary effort is normal. No respiratory distress.     Breath sounds: No wheezing.  Abdominal:     General: There is no distension.     Palpations: Abdomen is soft. There is no mass.  Musculoskeletal:        General: No swelling or tenderness.     Cervical  back: Normal range of motion.  Skin:    General: Skin is warm.  Neurological:     Mental Status: He is alert.     Comments: Right facial droop with dense expressive aphasia. Able to follow simple motor commands with verbal/tactile cues. Right inattention. RUE 0/5. RLE 3-4/5 prox to distal. LUE and LLE 4-5/5. Seemed to sense pain in all 4's. No resting tone.   Psychiatric:     Comments: Flat but cooperative        Lab Results Last 48 Hours        Results for orders placed or performed during the hospital encounter of 02/07/22 (from the past 48 hour(s))  Glucose, capillary     Status: Abnormal    Collection Time: 02/15/22 11:05 AM  Result Value Ref Range    Glucose-Capillary 134 (H) 70 - 99 mg/dL      Comment: Glucose reference range applies only to samples taken after fasting for at least 8 hours.  Glucose, capillary     Status: Abnormal    Collection Time: 02/15/22  4:24 PM  Result Value Ref Range    Glucose-Capillary 150 (H) 70 - 99 mg/dL      Comment: Glucose reference range applies only to samples taken after fasting for at least 8 hours.  Glucose, capillary     Status: Abnormal    Collection Time: 02/15/22  9:07 PM  Result Value Ref Range    Glucose-Capillary 152 (H) 70 - 99 mg/dL      Comment: Glucose reference range applies only to samples taken after fasting for at least 8 hours.    Comment 1 Notify RN      Comment 2 Document in Chart    CBC     Status: Abnormal    Collection Time: 02/16/22  5:27 AM  Result Value Ref Range     WBC 10.2 4.0 - 10.5 K/uL    RBC 4.01 (L) 4.22 - 5.81 MIL/uL    Hemoglobin 12.9 (L) 13.0 - 17.0 g/dL    HCT 16.140.8 09.639.0 - 04.552.0 %    MCV 101.7 (H) 80.0 - 100.0 fL    MCH 32.2 26.0 - 34.0 pg    MCHC 31.6 30.0 - 36.0 g/dL    RDW 40.912.0 81.111.5 - 91.415.5 %    Platelets 276 150 - 400 K/uL    nRBC 0.0 0.0 - 0.2 %      Comment: Performed at [facility], [address] [place], [place] [address]  Basic metabolic panel     Status: Abnormal    Collection Time: 02/16/22  5:27 AM  Result Value Ref Range    Sodium 148 (H) 135 - 145 mmol/L    Potassium 3.9 3.5 - 5.1 mmol/L    Chloride 110 98 - 111 mmol/L    CO2 30 22 - 32 mmol/L    Glucose, Bld 111 (H) 70 - 99 mg/dL      Comment: Glucose reference range applies only to samples taken after fasting for at least 8 hours.    BUN 37 (H) 8 - 23 mg/dL    Creatinine, Ser 6.210.83 0.61 - 1.24 mg/dL    Calcium 9.1 8.9 - 30.810.3 mg/dL    GFR, Estimated >65>60 >78>60 mL/min      Comment: (NOTE) Calculated using the CKD-EPI Creatinine Equation (2021)      Anion gap 8 5 - 15      Comment: Performed at [facility], [address] [place], [place]  [address]  Glucose, capillary     Status: Abnormal    Collection Time: 02/16/22  7:12 AM  Result Value Ref Range    Glucose-Capillary 109 (H) 70 - 99 mg/dL      Comment: Glucose reference range applies only to samples taken after fasting for at least 8 hours.  Glucose, capillary     Status: Abnormal    Collection Time: 02/16/22 11:05 AM  Result Value Ref Range    Glucose-Capillary 133 (H) 70 - 99 mg/dL      Comment: Glucose reference range applies only to samples taken after fasting for at least 8 hours.  Glucose, capillary     Status: Abnormal    Collection Time: 02/16/22  4:14 PM  Result Value Ref Range    Glucose-Capillary 142 (H) 70 - 99 mg/dL      Comment: Glucose reference range applies only to samples taken after fasting for at least 8 hours.  Glucose, capillary     Status: Abnormal    Collection Time: 02/16/22  9:40 PM   Result Value Ref Range    Glucose-Capillary 101 (H) 70 - 99 mg/dL      Comment: Glucose reference range applies only to samples taken after fasting for at least 8 hours.  Glucose, capillary     Status: Abnormal    Collection Time: 02/17/22 12:56 AM  Result Value Ref Range    Glucose-Capillary 101 (H) 70 - 99 mg/dL      Comment: Glucose reference range applies only to samples taken after fasting for at least 8 hours.  Glucose, capillary     Status: None    Collection Time: 02/17/22  5:20 AM  Result Value Ref Range    Glucose-Capillary 98 70 - 99 mg/dL      Comment: Glucose reference range applies only to samples taken after fasting for at least 8 hours.  Glucose, capillary     Status: Abnormal    Collection Time: 02/17/22  7:45 AM  Result Value Ref Range    Glucose-Capillary 109 (H) 70 - 99 mg/dL      Comment: Glucose reference range applies only to samples taken after fasting for at least 8 hours.  Renal function panel     Status: Abnormal    Collection Time: 02/17/22  9:30 AM  Result Value Ref Range    Sodium 145 135 - 145 mmol/L    Potassium 3.7 3.5 - 5.1 mmol/L    Chloride 109 98 - 111 mmol/L    CO2 27 22 - 32 mmol/L    Glucose, Bld 104 (H) 70 - 99 mg/dL      Comment: Glucose reference range applies only to samples taken after fasting for at least 8 hours.    BUN 32 (H) 8 - 23 mg/dL    Creatinine, Ser 9.21 0.61 - 1.24 mg/dL    Calcium 8.4 (L) 8.9 - 10.3 mg/dL    Phosphorus 3.7 2.5 - 4.6 mg/dL    Albumin 2.9 (L) 3.5 - 5.0 g/dL    GFR, Estimated >19 >41 mL/min      Comment: (NOTE) Calculated using the CKD-EPI Creatinine Equation (2021)      Anion gap 9 5 - 15      Comment: Performed at [facility], [address] [place], [place] [address]      Imaging Results (Last 48 hours)  No results found.         Blood pressure (!) 167/90, pulse 96, temperature 98 F (36.7  C), resp. rate 14, height  (1.651 m), weight 67.9 kg, SpO2 99 %.   Medical Problem List and  Plan: 1. Functional deficits secondary to left MCA/ICA stroke with dense RUE HP and expressive aphasia             -patient may shower             -ELOS/Goals: 10-14 days, supervision PT, sup/min OT, min to mod assist SLP             -WHO for RUE 2.  Antithrombotics: -DVT/anticoagulation:  Pharmaceutical: Lovenox             -antiplatelet therapy: DAPT X 3 months followed by ASA alone.  3. Pain Management: Tylenol prn.  4. Mood/Behavior/Sleep: LCSW to follow for evaluation and support.              -antipsychotic agents: N/A 5. Neuropsych/cognition: This patient is not fully capable of making decisions on his own behalf. 6. Skin/Wound Care: Routine pressure relief measures.  7. Fluids/Electrolytes/Nutrition: Monitor I/O. Check CMET in am for follow up on AKI/LFTs.             --Assist with/encourage honey thick fluid intake.  8. L-MCA infarct with hemorrhagic conversion: DAPT X 90 day followed by ASA alone 9. HTN: Monitor BP TID. BP remains labile. Resume Proscar.  --Continue Metoprolol--was titrated upwards 08/27 for better control.  10. Dysphagia: Continue D1, honey thick liquids. Needs assistance for feeding and supervision for safety.  --hypernatremia/AKI likely due to dysphagia diet 11. BPH: Monitor for any voiding difficulties. Will order PVR checks as off his meds.  --Proscar was not resumed. Flomax was d/c on 08/20.  12. Pre-renal azotemia: Offer fluids between meals.  --Indicated thirst -->drank honey liquids without negative expression.  -supplement with IVF if needed 13. Fever/Leucocytosis: Was monitored and has resolved. Continues to have low grade elevation at nights.  --continue to monitor for any signs of infection/aspiration.  -- Recheck CBC tomorrow          Jacquelynn Cree, PA-C 02/17/2022   I have personally performed a face to face diagnostic evaluation of this patient and formulated the key components of the plan.  Additionally, I have personally reviewed  laboratory data, imaging studies, as well as relevant notes and concur with the physician assistant's documentation above.  The patient's status has not changed from the original H&P.  Any changes in documentation from the acute care chart have been noted above.  Ranelle Oyster, MD, [place]

## 2022-02-17 NOTE — Progress Notes (Signed)
Report given to inpatient rehab, tele removed. IV intake per receiving nurse. Son aware of transfer. [facility] here to transport at this time.

## 2022-02-17 NOTE — Discharge Summary (Signed)
Paul Bradshaw, is a 88 y.o. male  DOB [birth year]  MRN [id number].  Admission date:  02/07/2022  Admitting Physician  Vassie Loll, MD  Discharge Date:  02/17/2022   Primary MD  Sonny Masters, FNP  Recommendations for primary care physician for things to follow:   1)Please take Aspirin 81 mg daily along with Plavix 75 mg daily for 90 days then after that STOP the Plavix  and continue ONLY Aspirin 81 mg daily indefinitely--for secondary stroke Prevention (Per The multicenter SAMMPRIS trial)  2) patient is at increased risk for dehydration and hypernatremia and AKI due to poor oral intake due to swallowing difficulties as a result of his acute stroke--- please repeat BMP on Tuesdays and Fridays for the next 2 weeks--- may need occasional IV fluid infusions  Admission Diagnosis  Acute ischemic stroke (HCC) [I63.9] Chronic ischemic left MCA stroke [I69.30]   Discharge Diagnosis  Acute ischemic stroke (HCC) [I63.9] Chronic ischemic left MCA stroke [I69.30]    Principal Problem:   Acute ischemic stroke (HCC) Active Problems:   BPH (benign prostatic hyperplasia)   Leukocytosis   Pressure injury of skin   Essential hypertension   Glaucoma      Past Medical History:  Diagnosis Date   Aortic regurgitation    Moderate   Arthritis    BPH (benign prostatic hyperplasia)    Cervical disc disease    Cervical radiculopathy    Hyperlipidemia    Hypertension    Prediabetes 02/16/2021   Staphylococcus aureus bacteremia 08/09/2012   TEE negative for vegetation or thrombus February 2014   Stroke [facility]) 2005 or 2006   3   Symptomatic carotid artery stenosis with infarction (HCC) 2005 or 2006   Status post right carotid endarterectomy    Past Surgical History:  Procedure Laterality Date   BACK SURGERY     CAROTID ENDARTERECTOMY     CATARACT EXTRACTION W/PHACO Left 02/15/2015   Procedure: CATARACT  EXTRACTION PHACO AND INTRAOCULAR LENS PLACEMENT LEFT EYE CDE=30.93;  Surgeon: Gemma Payor, MD;  Location: [facility];  Service: Ophthalmology;  Laterality: Left;   CHOLECYSTECTOMY     EYE SURGERY     KIDNEY STONE SURGERY     LUMBAR LAMINECTOMY/DECOMPRESSION MICRODISCECTOMY Bilateral 01/23/2014   Procedure: LUMBAR LAMINECTOMY/DECOMPRESSION MICRODISCECTOMY 1 LEVEL L5-S1;  Surgeon: Temple Pacini, MD;  Location: [facility];  Service: Neurosurgery;  Laterality: Bilateral;  LUMBAR LAMINECTOMY/DECOMPRESSION MICRODISCECTOMY 1 LEVEL L5-S1   TEE WITHOUT CARDIOVERSION N/A 08/12/2012   Procedure: TRANSESOPHAGEAL ECHOCARDIOGRAM (TEE);  Surgeon: Wendall Stade, MD;  Location: [facility];  Service: Cardiovascular;  Laterality: N/A;   TEE WITHOUT CARDIOVERSION N/A 06/24/2013   Procedure: TRANSESOPHAGEAL ECHOCARDIOGRAM (TEE);  Surgeon: Jonelle Sidle, MD;  Location: [facility];  Service: Endoscopy;  Laterality: N/A;     HPI  from the history and physical done on the day of admission:   HPI: Paul Bradshaw is a 88 y.o. male with medical history significant of hypertension, BPH, hyperlipidemia, history of carotid artery disease  and chronic back pain with radiculopathy; who presented to the emergency department secondary to aphasia, inability to walk and right-sided weakness.  Patient was last seen normal prior to bed on 02/06/2022 (around 9:10 PM).    Symptoms remains present at time of evaluation; there has not been any chest pain, shortness of breath, fever, dysuria, hematuria, melena, hematochezia, abdominal pain, sick contacts or any other complaints.   Work-up in the ED demonstrated positive left acute MCA ischemic stroke with surrounding abnormalities and large vessel occlusion.  Case discussed with neurology service who after talking with patient's son and his age/comorbidities decision was made to treat was treatable and completed stroke work-up without aggressive/invasive intervention.  Overall prognosis  is guarded.   Review of Systems: As mentioned in the history of present illness. All other systems reviewed and are negative.     Hospital Course:   Brief Narrative / Interim history: 88 year old male with HTN, BPH, HLD, CAD, comes into the hospital on 8/18 with aphasia, inability to walk, right-sided weakness and facial droop.  He was found to have a left acute MCA ischemic stroke with surrounding abnormalities and large vessel occlusion.  Case was discussed with neurology, and after talking with patient's son and given age, comorbidities the decision was made to treat the treatable, complete the work-up without any aggressive interventions.   Pertinent data: CT head 8/18-cytotoxic edema suspected in the left MCA territory, no hemorrhage CTA 8/18-acute left MCA ischemia, near occlusion of left ICA origin and bulb, age-indeterminate but acute appearing thrombus in the left side found with severe stenosis and multiple MCA branch occlusions. Brain MRI 8/18-acute infarct of the left insula, frontal operculum and high frontal lobe and MCA distribution CT head 8/18-hemorrhage or hemorrhagic conversion of the left MCA stroke Chest x-ray 8/20-no active disease 2D echo 8/19-EF 65-70%, no WMA, LVH, RV was normal   A/p Principal problem Lt MCA Acute CVA-with Broca's aphasia -  -Neurology consult appreciated  -Continue Aspirin 81 mg daily along with Plavix 75 mg daily for 90 days then after that STOP the Plavix  and continue ONLY Aspirin 81 mg daily indefinitely--for secondary stroke Prevention (Per The multicenter SAMMPRIS trial) Stroke work-up as above, including 2D echo, CT angiogram head and neck.  Lipid panel shows an LDL of 34------- Even if his lipid panel is within desired limits, patient should still take Lipitor/Statin for it's Pleiotropic effects (beyond cholesterol lowering benefits) - Take Aspirin 81 mg daily along with Plavix 75 mg daily for 90 days then after that STOP the Plavix  and  continue ONLY Aspirin 81 mg daily indefinitely--for secondary stroke Prevention (Per The multicenter SAMMPRIS trial) -Right upper extremity weakness and motor aphasia persist -Transfer to Inpatient Rehab/CIR-----patient will need physical therapy, Occupational Therapy and speech therapy    Active problems  Dysphagia--- due to acute stroke as above #1    Diet recommendations: Dysphagia 1 (puree);Honey-thick liquid Liquids provided via: Cup;No straw Medication Administration: Crushed with puree Supervision: Staff to assist with self feeding;Full supervision/cueing for compensatory strategies Compensations: Minimize environmental distractions;Slow rate;Lingual sweep for clearance of pocketing;Monitor for anterior loss;Multiple dry swallows after each bite/sip;Clear throat intermittently Postural Changes and/or Swallow Maneuvers: Seated upright 90 degrees;Upright 30-60 min after meal   Essential hypertension -initially allowed permissive hypertension Change to metoprolol to Toprol-XL 50 mg daily due to elevated BP and tachyardia, consider titrating Toprol-XL up depending on BP and heart rate   Pressure injury of skin -Stage I perineum/medial sacrum pressure injury appreciated at time of  admission.   Leukocytosis -No signs of acute infection appreciated, most likely stress demargination -Leukocytosis resolved   BPH (benign prostatic hyperplasia) -No complaints of urinary retention -Continue PTA Proscar and Flomax   Goals of care-palliative care consulted,  DNR/DNI without limitations of care otherwise -Patient and family wants full scope of treatment   Hypernatremia---sodium is down to 145 from 148  -Hypernatremia is due to swallowing difficulties/free water deficit - Patient is at increased risk for dehydration and hypernatremia and AKI due to poor oral intake due to swallowing difficulties as a result of his acute stroke--- please repeat BMP on Tuesdays and Fridays for the next 2  weeks--- may need occasional IV fluid infusions  Glaucoma -Continue treatment with Alphagan and Xalatan.  Discharge Condition: Stable  Follow UP--CIR   Consults obtained -neurology  Diet and Activity recommendation:  As advised  Discharge Instructions    Discharge Instructions     Call MD for:  difficulty breathing, headache or visual disturbances   Complete by: As directed    Call MD for:  persistant dizziness or light-headedness   Complete by: As directed    Call MD for:  persistant nausea and vomiting   Complete by: As directed    Call MD for:  temperature >100.4   Complete by: As directed    Diet - low sodium heart healthy   Complete by: As directed    Discharge instructions   Complete by: As directed    1)Please take Aspirin 81 mg daily along with Plavix 75 mg daily for 90 days then after that STOP the Plavix  and continue ONLY Aspirin 81 mg daily indefinitely--for secondary stroke Prevention (Per The multicenter SAMMPRIS trial)  2) patient is at increased risk for dehydration and hypernatremia and AKI due to poor oral intake due to swallowing difficulties as a result of his acute stroke--- please repeat BMP on Tuesdays and Fridays for the next 2 weeks--- may need occasional IV fluid infusions   Discharge wound care:   Complete by: As directed    As above   Increase activity slowly   Complete by: As directed        Discharge Medications     Allergies as of 02/17/2022       Reactions   Amlodipine Swelling   Swelling of lips.   Lisinopril Other (See Comments)   Elevated BP higher & causes a burning feeling on the inside.         Medication List     TAKE these medications    acetaminophen 500 MG tablet Commonly known as: TYLENOL Take 1,000 mg by mouth every 8 (eight) hours as needed for moderate pain.   albuterol 108 (90 Base) MCG/ACT inhaler Commonly known as: VENTOLIN HFA Inhale 2 puffs into the lungs every 6 (six) hours as needed for shortness of  breath or wheezing.   aspirin EC 81 MG tablet Take 1 tablet (81 mg total) by mouth daily with breakfast. take Aspirin 81 mg daily along with Plavix 75 mg daily for 90 days then after that STOP the Plavix  and continue ONLY Aspirin 81 mg daily indefinitely-- Start taking on: February 18, 2022   atorvastatin 10 MG tablet Commonly known as: LIPITOR Take 1 tablet (10 mg total) by mouth daily. What changed: See the new instructions.   brimonidine 0.2 % ophthalmic solution Commonly known as: ALPHAGAN Place 1 drop into both eyes 3 (three) times daily.   cetirizine 10 MG tablet Commonly known as: ZYRTEC  Take 10 mg by mouth daily.   clopidogrel 75 MG tablet Commonly known as: PLAVIX Take 1 tablet (75 mg total) by mouth daily with breakfast. take Aspirin 81 mg daily along with Plavix 75 mg daily for 90 days then after that STOP the Plavix  and continue ONLY Aspirin 81 mg daily indefinitely-- What changed: additional instructions   dorzolamide-timolol 22.3-6.8 MG/ML ophthalmic solution Commonly known as: COSOPT Place 1 drop into both eyes 2 (two) times daily.   finasteride 5 MG tablet Commonly known as: PROSCAR Take 1 tablet (5 mg total) by mouth daily.   HYDROcodone-acetaminophen 7.5-325 MG tablet Commonly known as: Norco Take 1 tablet by mouth every 6 (six) hours as needed for moderate pain. Start taking on: March 15, 2022 What changed: Another medication with the same name was removed. Continue taking this medication, and follow the directions you see here.   latanoprost 0.005 % ophthalmic solution Commonly known as: XALATAN Place 1 drop into both eyes every evening.   metoprolol succinate 50 MG 24 hr tablet Commonly known as: Toprol XL Take 1 tablet (50 mg total) by mouth daily. Take with or immediately following a meal.   MULTIVITAMIN PO Take 1 tablet by mouth daily.   pantoprazole 40 MG tablet Commonly known as: Protonix Take 1 tablet (40 mg total) by mouth daily.    senna-docusate 8.6-50 MG tablet Commonly known as: Senokot-S Take 2 tablets by mouth at bedtime.   tamsulosin 0.4 MG Caps capsule Commonly known as: FLOMAX TAKE 1 CAPSULE BY MOUTH IN  THE EVENING   Turmeric 1053 MG Tabs Take 1 tablet by mouth daily.               Discharge Care Instructions  (From admission, onward)           Start     Ordered   02/17/22 0000  Discharge wound care:       Comments: As above   02/17/22 1153           Major procedures and Radiology Reports - PLEASE review detailed and final reports for all details, in brief -   DG Swallowing Func-Speech Pathology  Result Date: 02/13/2022 Table formatting from the original result was not included. Objective Swallowing Evaluation: Type of Study: MBS-Modified Barium Swallow Study  Patient Details Name: Paul Bradshaw MRN: [id number] Date of Birth: 1925/10/13 Today's Date: 02/13/2022 Time: SLP Start Time (ACUTE ONLY): 1050 -SLP Stop Time (ACUTE ONLY): 1120 SLP Time Calculation (min) (ACUTE ONLY): 30 min Past Medical History: Past Medical History: Diagnosis Date  Aortic regurgitation   Moderate  Arthritis   BPH (benign prostatic hyperplasia)   Cervical disc disease   Cervical radiculopathy   Hyperlipidemia   Hypertension   Prediabetes 02/16/2021  Staphylococcus aureus bacteremia 08/09/2012  TEE negative for vegetation or thrombus February 2014  Stroke [facility]) 2005 or 2006  3  Symptomatic carotid artery stenosis with infarction (HCC) 2005 or 2006  Status post right carotid endarterectomy Past Surgical History: Past Surgical History: Procedure Laterality Date  BACK SURGERY    CAROTID ENDARTERECTOMY    CATARACT EXTRACTION W/PHACO Left 02/15/2015  Procedure: CATARACT EXTRACTION PHACO AND INTRAOCULAR LENS PLACEMENT LEFT EYE CDE=30.93;  Surgeon: Gemma Payor, MD;  Location: [facility];  Service: Ophthalmology;  Laterality: Left;  CHOLECYSTECTOMY    EYE SURGERY    KIDNEY STONE SURGERY    LUMBAR LAMINECTOMY/DECOMPRESSION  MICRODISCECTOMY Bilateral 01/23/2014  Procedure: LUMBAR LAMINECTOMY/DECOMPRESSION MICRODISCECTOMY 1 LEVEL L5-S1;  Surgeon: Temple Pacini, MD;  Location: [facility];  Service: Neurosurgery;  Laterality: Bilateral;  LUMBAR LAMINECTOMY/DECOMPRESSION MICRODISCECTOMY 1 LEVEL L5-S1  TEE WITHOUT CARDIOVERSION N/A 08/12/2012  Procedure: TRANSESOPHAGEAL ECHOCARDIOGRAM (TEE);  Surgeon: Wendall Stade, MD;  [facility]: [facility];  Service: Cardiovascular;  Laterality: N/A;  TEE WITHOUT CARDIOVERSION N/A 06/24/2013  Procedure: TRANSESOPHAGEAL ECHOCARDIOGRAM (TEE);  Surgeon: Jonelle Sidle, MD;  Location: [facility];  Service: Endoscopy;  Laterality: N/A; HPI: Paul Bradshaw is a 88 y.o. male with medical history significant of hypertension, BPH, hyperlipidemia, history of carotid artery disease and chronic back pain with radiculopathy; who presented to the emergency department secondary to aphasia, inability to walk and right-sided weakness.  Patient was last seen normal prior to bed on 02/06/2022 (around 9:10 PM). noted to have MCA infarct and several occlusions on LVO. BSE and SLE ordred.  Subjective: Nonverbal  Recommendations for follow up therapy are one component of a multi-disciplinary discharge planning process, led by the attending physician.  Recommendations may be updated based on patient status, additional functional criteria and insurance authorization. Assessment / Plan / Recommendation   02/13/2022  12:00 PM Clinical Impressions Clinical Impression Pt presents with mod/severe oropharyngeal dysphagia in setting of dense right hemiparesis (with aphasia) with reduced ability to follow commands for compensation. Oral phase is marked by impaired lingual movement resulting in delayed oral transit, reduce bolus cohesiveness, premature spillage, piecemeal deglutition, and right buccal pocketing; Pharyngeal phase is marked by premature spillage with thins and nectars to the pyriforms with delay in swallow  initiation, reduced tongue base retraction, epiglottic deflection, pharyngeal pressure and laryngeal closure resulting in penetration and aspiration in trace to mi/mod amount with thins after the swallow (occurred and was sensed x1 and unsensed x1 and not removed), and base of tongue, vallecular, and lateral channel residue with thins, NTL, HTL, and purees. Pt is at risk for aspiration and inability to meet nutritional needs, however family  as expressed that feeding tubes are not within Pt's goals of care and would like to proceed with "safest diet" with least risk of aspiration. Recommend D1/puree and HTL via cup/tsp and hopeful progression to NTL at next venue. Pt is motivated and alert, but has difficulty swallowing and coughing on command. Encourage repeat swallows as able, lingual sweep, and assist from feeder to clear right buccal cavity. Recommend dysphagia and aphasia therapy in acute setting and at next venue. PO medications crushed as able in puree, feeder assist. Results of this study were reviewed with family. SLP Visit Diagnosis Dysphagia, oropharyngeal phase (R13.12) Impact on safety and function Moderate aspiration risk;Risk for inadequate nutrition/hydration     02/13/2022  12:00 PM Treatment Recommendations Treatment Recommendations Therapy as outlined in treatment plan below     02/13/2022  12:00 PM Prognosis Prognosis for Safe Diet Advancement Fair Barriers to Reach Goals Language deficits;Severity of deficits   02/13/2022  12:00 PM Diet Recommendations SLP Diet Recommendations Dysphagia 1 (Puree) solids;Honey thick liquids Liquid Administration via Cup;Spoon Medication Administration Crushed with puree Compensations Minimize environmental distractions;Slow rate;Lingual sweep for clearance of pocketing;Monitor for anterior loss;Multiple dry swallows after each bite/sip;Clear throat intermittently Postural Changes Remain semi-upright after after feeds/meals (Comment);Seated upright at 90 degrees      02/13/2022  12:00 PM Other Recommendations Oral Care Recommendations Oral care before and after PO;Staff/trained caregiver to provide oral care Other Recommendations Prohibited food (jello, ice cream, thin soups);Order thickener from pharmacy;Clarify dietary restrictions Follow Up Recommendations Acute inpatient rehab (3hours/day) Assistance recommended at discharge Frequent or constant Supervision/Assistance Functional  Status Assessment Patient has had a recent decline in their functional status and demonstrates the ability to make significant improvements in function in a reasonable and predictable amount of time.   02/13/2022  12:00 PM Frequency and Duration  Speech Therapy Frequency (ACUTE ONLY) min 2x/week Treatment Duration 1 week     02/13/2022  12:00 PM Oral Phase Oral Phase Impaired Oral - Honey Teaspoon Weak lingual manipulation;Incomplete tongue to palate contact;Reduced posterior propulsion;Lingual/palatal residue;Delayed oral transit;Decreased bolus cohesion Oral - Honey Cup Weak lingual manipulation;Incomplete tongue to palate contact;Reduced posterior propulsion;Lingual/palatal residue;Delayed oral transit;Decreased bolus cohesion Oral - Nectar Cup Weak lingual manipulation;Incomplete tongue to palate contact;Reduced posterior propulsion;Lingual/palatal residue;Delayed oral transit;Decreased bolus cohesion;Premature spillage Oral - Thin Teaspoon Weak lingual manipulation;Incomplete tongue to palate contact;Reduced posterior propulsion;Lingual/palatal residue;Delayed oral transit;Decreased bolus cohesion;Premature spillage Oral - Puree Weak lingual manipulation;Incomplete tongue to palate contact;Reduced posterior propulsion;Right pocketing in lateral sulci;Lingual/palatal residue;Piecemeal swallowing;Delayed oral transit;Decreased bolus cohesion    02/13/2022  12:00 PM Pharyngeal Phase Pharyngeal Phase Impaired Pharyngeal- Honey Teaspoon Delayed swallow initiation-vallecula;Reduced epiglottic  inversion;Reduced tongue base retraction;Pharyngeal residue - valleculae;Lateral channel residue Pharyngeal- Honey Cup Delayed swallow initiation-vallecula;Reduced pharyngeal peristalsis;Reduced epiglottic inversion;Reduced tongue base retraction;Pharyngeal residue - valleculae;Lateral channel residue Pharyngeal- Nectar Teaspoon Delayed swallow initiation-pyriform sinuses;Reduced pharyngeal peristalsis;Reduced epiglottic inversion;Reduced airway/laryngeal closure;Pharyngeal residue - valleculae;Lateral channel residue Pharyngeal Material enters airway, remains ABOVE vocal cords then ejected out Pharyngeal- Nectar Cup Delayed swallow initiation-pyriform sinuses;Reduced epiglottic inversion;Reduced airway/laryngeal closure;Reduced tongue base retraction;Penetration/Aspiration during swallow;Penetration/Apiration after swallow;Pharyngeal residue - valleculae;Lateral channel residue Pharyngeal Material enters airway, CONTACTS cords and then ejected out Pharyngeal- Thin Teaspoon Delayed swallow initiation-pyriform sinuses;Reduced pharyngeal peristalsis;Reduced epiglottic inversion;Reduced airway/laryngeal closure;Penetration/Apiration after swallow;Reduced tongue base retraction;Trace aspiration;Moderate aspiration;Pharyngeal residue - valleculae;Lateral channel residue Pharyngeal Material enters airway, passes BELOW cords without attempt by patient to eject out (silent aspiration) Pharyngeal- Puree Delayed swallow initiation-vallecula;Reduced pharyngeal peristalsis;Reduced epiglottic inversion;Reduced tongue base retraction;Penetration/Apiration after swallow;Pharyngeal residue - valleculae Pharyngeal Material enters airway, remains ABOVE vocal cords then ejected out    02/13/2022  12:00 PM Cervical Esophageal Phase  Cerv[facility]ophageal [facility] Thank you, Havery MorosDabney Porter, CCC-SLP [phone] PORTER,DABNEY 02/13/2022, 1:00 PM                     CT HEAD WO CONTRAST (5MM)  Result Date: 02/09/2022 CLINICAL DATA:  Stroke  follow-up EXAM: CT HEAD WITHOUT CONTRAST TECHNIQUE: Contiguous axial images were obtained from the base of the skull through the vertex without intravenous contrast. RADIATION DOSE REDUCTION: This exam was performed according to the departmental dose-optimization program which includes automated exposure control, adjustment of the mA and/or kV according to patient size and/or use of iterative reconstruction technique. COMPARISON:  CT and brain MRI dated 1 day prior FINDINGS: Brain: There is ongoing evolution of the acute to early subacute infarct in the left MCA distribution without evidence of hemorrhagic transformation. There is edema with sulcal effacement but no midline shift. There is no new acute territorial infarct. There is no acute intracranial hemorrhage or extra-axial fluid collection. Ventricles are stable in size. Background chronic white matter microangiopathy is unchanged. There is no mass lesion. Vascular: There is calcification of the bilateral cavernous ICAs. Skull: Normal. Negative for fracture or focal lesion. Sinuses/Orbits: Stable. Other: None. IMPRESSION: Ongoing evolution of the left MCA distribution infarct without evidence of hemorrhagic transformation. Electronically Signed   By: Lesia HausenPeter  Noone M.D.   On: 02/09/2022 16:04   DG CHEST PORT 1 VIEW  Result Date: 02/09/2022 CLINICAL DATA:  Short of breath. EXAM: PORTABLE  CHEST 1 VIEW COMPARISON:  06/29/2021 and older studies. FINDINGS: Cardiac silhouette is normal in size. No mediastinal or hilar masses. Clear lungs.  No pleural effusion or pneumothorax. Skeletal structures are grossly intact. IMPRESSION: No active disease. Electronically Signed   By: Amie Portland M.D.   On: 02/09/2022 13:36   ECHOCARDIOGRAM COMPLETE  Result Date: 02/08/2022    ECHOCARDIOGRAM REPORT   Patient Name:   Paul Bradshaw Date of Exam: 02/08/2022 Medical Rec #:  [id number]     Height:       65.0 in Accession #:    [id number]    Weight:       149.7 lb Date of  Birth:  1926/05/14     BSA:          1.749 m Patient Age:    89 years      BP:           165/80 mmHg Patient Gender: M             HR:           106 bpm. Exam Location:  [facility] Procedure: 2D Echo, Cardiac Doppler and Color Doppler Indications:    Stroke  History:        Patient has prior history of Echocardiogram examinations, most                 recent 06/22/2013. Stroke; Risk Factors:Hypertension and                 Dyslipidemia.  Sonographer:    Mikki Harbor Referring Phys: [phone] CARLOS MADERA IMPRESSIONS  1. Left ventricular ejection fraction, by estimation, is 65 to 70%. The left ventricle has normal function. The left ventricle has no regional wall motion abnormalities. There is moderate left ventricular hypertrophy. Left ventricular diastolic parameters are consistent with Grade I diastolic dysfunction (impaired relaxation). Elevated left atrial pressure.  2. Right ventricular systolic function is normal. The right ventricular size is normal.  3. Left atrial size was mildly dilated.  4. The mitral valve was not well visualized. No evidence of mitral valve regurgitation. Mild mitral stenosis. MV peak gradient, 19.7 mmHg. The mean mitral valve gradient is 5.0 mmHg.     Moderate mitral annular calcification.  5. The aortic valve was not well visualized. There is moderate calcification of the aortic valve. There is moderate thickening of the aortic valve. Aortic valve regurgitation is mild to moderate. Mild aortic valve stenosis. . Aortic valve mean gradient measures 10.0 mmHg. Aortic valve peak gradient measures 19.8 mmHg. Aortic valve area, by VTI measures 1.65 cm.  6. Aortic dilatation noted. There is mild dilatation of the aortic root, measuring 41 mm. There is mild dilatation of the ascending aorta, measuring 38 mm. FINDINGS  Left Ventricle: Left ventricular ejection fraction, by estimation, is 65 to 70%. The left ventricle has normal function. The left ventricle has no regional wall motion  abnormalities. The left ventricular internal cavity size was normal in size. There is  moderate left ventricular hypertrophy. Left ventricular diastolic parameters are consistent with Grade I diastolic dysfunction (impaired relaxation). Elevated left atrial pressure. Right Ventricle: The right ventricular size is normal. No increase in right ventricular wall thickness. Right ventricular systolic function is normal. Left Atrium: Left atrial size was mildly dilated. Right Atrium: Right atrial size was normal in size. Pericardium: There is no evidence of pericardial effusion. Mitral Valve: The mitral valve was not well visualized. There is moderate thickening of the mitral valve  leaflet(s). There is moderate calcification of the mitral valve leaflet(s). Moderate mitral annular calcification. No evidence of mitral valve regurgitation. Mild mitral valve stenosis. MV peak gradient, 19.7 mmHg. The mean mitral valve gradient is 5.0 mmHg. Tricuspid Valve: The tricuspid valve is not well visualized. Tricuspid valve regurgitation is mild . No evidence of tricuspid stenosis. Aortic Valve: The aortic valve was not well visualized. There is moderate calcification of the aortic valve. There is moderate thickening of the aortic valve. There is moderate aortic valve annular calcification. Aortic valve regurgitation is mild to moderate. Aortic regurgitation PHT measures 283 msec. Mild aortic stenosis is present. Aortic valve mean gradient measures 10.0 mmHg. Aortic valve peak gradient measures 19.8 mmHg. Aortic valve area, by VTI measures 1.65 cm. Pulmonic Valve: The pulmonic valve was not well visualized. Pulmonic valve regurgitation is not visualized. No evidence of pulmonic stenosis. Aorta: Aortic dilatation noted. There is mild dilatation of the aortic root, measuring 41 mm. There is mild dilatation of the ascending aorta, measuring 38 mm. Venous: The inferior vena cava was not well visualized. IAS/Shunts: The interatrial septum  was not well visualized.  LEFT VENTRICLE PLAX 2D LVIDd:         3.20 cm   Diastology LVIDs:         2.40 cm   LV e' medial:    3.00 cm/s LV PW:         1.50 cm   LV E/e' medial:  36.0 LV IVS:        1.30 cm   LV e' lateral:   5.00 cm/s LVOT diam:     2.00 cm   LV E/e' lateral: 21.6 LV SV:         61 LV SV Index:   35 LVOT Area:     3.14 cm  RIGHT VENTRICLE RV Basal diam:  2.55 cm RV Mid diam:    2.40 cm LEFT ATRIUM             Index        RIGHT ATRIUM           Index LA diam:        4.40 cm 2.52 cm/m   RA Area:     14.80 cm LA Vol (A2C):   67.3 ml 38.48 ml/m  RA Volume:   30.80 ml  17.61 ml/m LA Vol (A4C):   58.2 ml 33.28 ml/m LA Biplane Vol: 68.1 ml 38.94 ml/m  AORTIC VALVE                     PULMONIC VALVE AV Area (Vmax):    1.65 cm      PV Vmax:       1.02 m/s AV Area (Vmean):   1.40 cm      PV Peak grad:  4.2 mmHg AV Area (VTI):     1.65 cm AV Vmax:           222.50 cm/s AV Vmean:          146.000 cm/s AV VTI:            0.368 m AV Peak Grad:      19.8 mmHg AV Mean Grad:      10.0 mmHg LVOT Vmax:         117.00 cm/s LVOT Vmean:        65.000 cm/s LVOT VTI:          0.193 m LVOT/AV VTI ratio: 0.53 AI PHT:  283 msec  AORTA Ao Root diam: 4.10 cm Ao Asc diam:  3.80 cm MITRAL VALVE                TRICUSPID VALVE MV Area (PHT): 6.37 cm     TR Peak grad:   35.8 mmHg MV Area VTI:   1.71 cm     TR Vmax:        299.00 cm/s MV Peak grad:  19.7 mmHg MV Mean grad:  5.0 mmHg     SHUNTS MV Vmax:       2.22 m/s     Systemic VTI:  0.19 m MV Vmean:      97.5 cm/s    Systemic Diam: 2.00 cm MV Decel Time: 119 msec MV E velocity: 108.00 cm/s MV A velocity: 155.00 cm/s MV E/A ratio:  0.70 Dina Rich MD Electronically signed by Dina Rich MD Signature Date/Time: 02/08/2022/1:29:43 PM    Final    CT HEAD WO CONTRAST ( )  Result Date: 02/08/2022 CLINICAL DATA:  Follow-up stroke. EXAM: CT HEAD WITHOUT CONTRAST TECHNIQUE: Contiguous axial images were obtained from the base of the skull through the  vertex without intravenous contrast. RADIATION DOSE REDUCTION: This exam was performed according to the departmental dose-optimization program which includes automated exposure control, adjustment of the mA and/or kV according to patient size and/or use of iterative reconstruction technique. COMPARISON:  Earlier CT and MRI dated 02/07/2022. FINDINGS: Brain: Left MCA territory hypodensity with mild edema in keeping with known acute stroke. There is associated mild mass effect and effacement of the sulci. No midline shift. No acute intracranial hemorrhage or hemorrhagic conversion of the stroke. No extra-axial fluid collection. Vascular: Better evaluated on the earlier CTA. Skull: Normal. Negative for fracture or focal lesion. Sinuses/Orbits: No acute finding. Other: None IMPRESSION: No acute intracranial hemorrhage or hemorrhagic conversion of the left MCA territory stroke. Electronically Signed   By: Elgie Collard M.D.   On: 02/08/2022 00:44   MR BRAIN WO CONTRAST  Result Date: 02/07/2022 CLINICAL DATA:  Stroke follow-up.  Neuro deficit. EXAM: MRI HEAD WITHOUT CONTRAST MRA HEAD WITHOUT CONTRAST TECHNIQUE: Multiplanar, multi-echo pulse sequences of the brain and surrounding structures were acquired without intravenous contrast. Angiographic images of the Circle of Willis were acquired using MRA technique without intravenous contrast. COMPARISON:  Same-day CT and CTA head and neck FINDINGS: MRI HEAD FINDINGS Brain: There is diffusion restriction with associated faint FLAIR signal abnormality involving the insula, frontal operculum, and high frontal lobe consistent with acute infarct in the MCA distribution. Additional small foci of diffusion restriction are seen in the cortex over the left paracentral lobule. There is no associated hemorrhage or mass effect. There is no evidence of acute ischemia in the right cerebral hemisphere. There is no acute intracranial hemorrhage or extra-axial fluid collection. There  is mild background parenchymal volume loss, within expected limits for age. The ventricles are stable in size. Patchy FLAIR signal abnormality in the supratentorial white matter likely reflects sequela of underlying chronic white matter microangiopathy. There is no mass lesion.  There is no mass effect or midline shift. Vascular: There is abnormality in the flow void of the proximal left cavernous ICA common better assessed below (10-6). Skull and upper cervical spine: Normal marrow signal. Sinuses/Orbits: The paranasal sinuses are clear. Bilateral lens implants are in place. The globes and orbits are otherwise unremarkable. Other: None. MRA HEAD FINDINGS Anterior circulation: There is diminished flow related enhancement in the left intracranial ICA at the petrous segment  with focal stenosis of the vertical portion corresponding to the hypodense filling defect seen on the same-day CTA. The ICA distal to this portion appears patent though with significantly diminished enhancement at the communicating segment. There is more robust flow related enhancement in the M1 segment; however, there is diminished flow related enhancement in the proximal M2 branches compared to the right. Two superior left M2/M3 branches appear occluded in the sylvian fissure with diminutive reconstitution of flow distally (1-111, 1-96). There is irregularity of the right intracranial ICA without high-grade stenosis or occlusion. There is normal flow related enhancement in the right MCA branches without proximal high-grade stenosis or occlusion The right A1 segment is not identified, likely occluded. The left A1 segment is patent. There is multifocal irregularity of the distal ACA branches with severe stenosis of the right A2 segment as seen on prior CTA. There is no aneurysm or AVM. Posterior circulation: The V4 segments are not included within the field of view. The imaged basilar artery is patent with mild irregularity. There is focal severe  stenosis of the left P2 segment (1-67). There is severe stenosis of the distal right P1 segment and moderate to severe stenosis of the right proximal P2 segment. There is atherosclerotic irregularity of the more distal PCA branches. Bilateral posterior communicating arteries as well as P1 segments are identified. There is no aneurysm or AVM. Anatomic variants: None. IMPRESSION: 1. Acute infarct involving the left insula, frontal operculum, and high frontal lobe in the MCA distribution. 2. Diminished flow related enhancement in the proximal left internal carotid artery with focal stenosis in the vertical portion of the petrous segment corresponding to the hypodense filling defect on the same-day CTA. There is diminished enhancement in the distal intracranial ICA as well as in the left MCA branches in the sylvian fissure, with suspected occlusion of two superior M2/M3 branches with diminutive reconstitution distally. 3. Additional intracranial atherosclerotic disease resulting in occlusion of the right A1 segment, focal severe stenosis of the right A2 segment, and severe stenoses of the bilateral P2 segments. Electronically Signed   By: Lesia Hausen M.D.   On: 02/07/2022 15:19   MR ANGIO HEAD WO CONTRAST  Result Date: 02/07/2022 CLINICAL DATA:  Stroke follow-up.  Neuro deficit. EXAM: MRI HEAD WITHOUT CONTRAST MRA HEAD WITHOUT CONTRAST TECHNIQUE: Multiplanar, multi-echo pulse sequences of the brain and surrounding structures were acquired without intravenous contrast. Angiographic images of the Circle of Willis were acquired using MRA technique without intravenous contrast. COMPARISON:  Same-day CT and CTA head and neck FINDINGS: MRI HEAD FINDINGS Brain: There is diffusion restriction with associated faint FLAIR signal abnormality involving the insula, frontal operculum, and high frontal lobe consistent with acute infarct in the MCA distribution. Additional small foci of diffusion restriction are seen in the cortex  over the left paracentral lobule. There is no associated hemorrhage or mass effect. There is no evidence of acute ischemia in the right cerebral hemisphere. There is no acute intracranial hemorrhage or extra-axial fluid collection. There is mild background parenchymal volume loss, within expected limits for age. The ventricles are stable in size. Patchy FLAIR signal abnormality in the supratentorial white matter likely reflects sequela of underlying chronic white matter microangiopathy. There is no mass lesion.  There is no mass effect or midline shift. Vascular: There is abnormality in the flow void of the proximal left cavernous ICA common better assessed below (10-6). Skull and upper cervical spine: Normal marrow signal. Sinuses/Orbits: The paranasal sinuses are clear. Bilateral lens implants are  in place. The globes and orbits are otherwise unremarkable. Other: None. MRA HEAD FINDINGS Anterior circulation: There is diminished flow related enhancement in the left intracranial ICA at the petrous segment with focal stenosis of the vertical portion corresponding to the hypodense filling defect seen on the same-day CTA. The ICA distal to this portion appears patent though with significantly diminished enhancement at the communicating segment. There is more robust flow related enhancement in the M1 segment; however, there is diminished flow related enhancement in the proximal M2 branches compared to the right. Two superior left M2/M3 branches appear occluded in the sylvian fissure with diminutive reconstitution of flow distally (1-111, 1-96). There is irregularity of the right intracranial ICA without high-grade stenosis or occlusion. There is normal flow related enhancement in the right MCA branches without proximal high-grade stenosis or occlusion The right A1 segment is not identified, likely occluded. The left A1 segment is patent. There is multifocal irregularity of the distal ACA branches with severe stenosis of  the right A2 segment as seen on prior CTA. There is no aneurysm or AVM. Posterior circulation: The V4 segments are not included within the field of view. The imaged basilar artery is patent with mild irregularity. There is focal severe stenosis of the left P2 segment (1-67). There is severe stenosis of the distal right P1 segment and moderate to severe stenosis of the right proximal P2 segment. There is atherosclerotic irregularity of the more distal PCA branches. Bilateral posterior communicating arteries as well as P1 segments are identified. There is no aneurysm or AVM. Anatomic variants: None. IMPRESSION: 1. Acute infarct involving the left insula, frontal operculum, and high frontal lobe in the MCA distribution. 2. Diminished flow related enhancement in the proximal left internal carotid artery with focal stenosis in the vertical portion of the petrous segment corresponding to the hypodense filling defect on the same-day CTA. There is diminished enhancement in the distal intracranial ICA as well as in the left MCA branches in the sylvian fissure, with suspected occlusion of two superior M2/M3 branches with diminutive reconstitution distally. 3. Additional intracranial atherosclerotic disease resulting in occlusion of the right A1 segment, focal severe stenosis of the right A2 segment, and severe stenoses of the bilateral P2 segments. Electronically Signed   By: Lesia Hausen M.D.   On: 02/07/2022 15:19   CT ANGIO HEAD NECK W WO CM W PERF (CODE STROKE)  Result Date: 02/07/2022 CLINICAL DATA:  88 year old male code stroke presentation. Subtle left MCA infarct changes on plain head CT. EXAM: CT ANGIOGRAPHY HEAD AND NECK CT PERFUSION BRAIN TECHNIQUE: Multidetector CT imaging of the head and neck was performed using the standard protocol during bolus administration of intravenous contrast. Multiplanar CT image reconstructions and MIPs were obtained to evaluate the vascular anatomy. Carotid stenosis measurements  (when applicable) are obtained utilizing NASCET criteria, using the distal internal carotid diameter as the denominator. Multiphase CT imaging of the brain was performed following IV bolus contrast injection. Subsequent parametric perfusion maps were calculated using RAPID software. RADIATION DOSE REDUCTION: This exam was performed according to the departmental dose-optimization program which includes automated exposure control, adjustment of the mA and/or kV according to patient size and/or use of iterative reconstruction technique. CONTRAST:  OMNIPAQUE IOHEXOL 350 MG/ML SOLN COMPARISON:  Plain head CT 0917 hours today. FINDINGS: CT Brain Perfusion Findings: ASPECTS: 9 CBF (<30%) Volume: 38mL.  CBV changes in the same territory. Perfusion (Tmax>6.0s) volume: 47mL. Hypoperfusion index 0.5 (41 mL of T-max > 10 s. Mismatch Volume:  41mL Infarction Location:Left MCA CTA NECK Skeleton: Absent maxillary dentition. Cervical spine degeneration although mild for age. No acute osseous abnormality identified. Upper chest: Negative. Other neck: Negative. Aortic arch: Calcified aortic atherosclerosis. Three vessel arch configuration. Right carotid system: Tortuous brachiocephalic artery with minimal plaque and no stenosis. Mildly tortuous right CCA. Intermittent plaque before the bifurcation without stenosis. Moderate right lateral and posterior ICA origin and bulb calcified plaque but no stenosis. Left carotid system: Mildly tortuous proximal left CCA with mild soft plaque. Calcified plaque before the bifurcation but no stenosis. Calcified plaque at the bifurcation along with bulky complex soft and calcified plaque throughout the left ICA bulb and subsequent string sign stenosis of the vessel (series 5, image 111 and series 9, image 140. Despite this the left ICA remains patent although is diminutive in the upper neck. Vertebral arteries: Tortuous proximal right subclavian artery without stenosis. Calcified plaque at the  right vertebral artery origin with moderate to severe stenosis (series 8, image 161). But the vessel remains patent to the skull base with no additional plaque or stenosis. Proximal left subclavian artery soft and calcified plaque with up to 50 % stenosis with respect to the distal vessel. Normal left vertebral artery origin. Mildly dominant appearing left vertebral artery is patent to the skull base with no plaque or stenosis. CTA HEAD Posterior circulation: Right V4 segment calcified plaque at the crossing of the dura with no significant stenosis. The right vertebral artery terminates in PICA. Left V4 segment is normal and supplies the basilar. Patent basilar artery with multifocal irregularity and up to mild stenosis (series 12, image 23). Basilar artery tip remains patent. Patent SCA and left PCA origins. Fetal type bilateral PCA origins. Bilateral posterior communicating artery irregularity with mild stenosis. Bilateral PCA branches remain patent but there is severe short segment left and moderate to severe right P2 segment stenoses (series 12, images 19 and 25). Anterior circulation: Both ICA siphons are patent and heavily calcified. On the right side there is mild to moderate cavernous but moderate to severe supraclinoid stenosis (series 9, image 100) proximal to the right posterior communicating artery origin which remains normal. The left siphon is remarkable for conspicuous low-density filling defect in the vertical petrous segment (series 7, image 124 and series 9, image 119 suspicious for thrombus with severe stenosis of the vessel there. The left siphon remains patent with up to moderate additional calcified atherosclerotic stenosis. Left posterior communicating artery is patent. Patent carotid termini. Dominant left ACA and diminutive right A1 segments. Normal anterior communicating artery. Left ACA branches remain within normal limits, but there is severe right A2 segment stenosis (series 12, image  23). Right MCA M1 segment and ectatic MCA trifurcation are patent without stenosis. Right MCA branches are within normal limits. Left M1 segment remains patent although with suspicion of nonocclusive thrombus along the floor of the vessel series 19, image 128. Left MCA trifurcation is patent with mild irregularity. The most proximal MCA branch occlusion is visible on series 12, image 30 and appears to be an M2 branch at its bifurcation. There are multiple left M3 and M4 branch occlusions in the hemisphere. Venous sinuses: Early contrast timing, not well evaluated. Anatomic variants: Dominant left vertebral artery supplies the basilar, the right terminates in PICA. Dominant left and diminutive or absent right ACA A1 segments. Review of the MIP images confirms the above findings Preliminary result discussed by telephone with Dr. Bing Neighbors on 02/07/2022 at 09:38 . IMPRESSION: 1. Positive for acute Left  MCA ischemia with estimated infarct core of 48 mL (fairly concordant with ASPECTS 9) and 41 mL additional territory oligemia with a hypoperfusion index of 0.5. 2. CTA reveals near occlusion of the Left ICA origin and bulb, age indeterminate, but acute appearing thrombus in the Left siphon (series 9, image 119) with severe stenosis, and multiple MCA branch occlusions although predominantly M3/M4 distal branches. An M2 bifurcation appears occluded on series 12, image 30. And there is subtle nonocclusive thrombus suspected in the M1 segment. 3. Salient points above discussed by telephone with Dr. Bing Neighbors on 02/07/2022 at 0938 hours. 4. Underlying intracranial atherosclerosis, including severe bilateral P2 and right A2 segment stenoses. Severe right ICA supraclinoid stenosis. 5. And there is also severe atherosclerotic stenosis of the right vertebral artery origin, the right vertebral terminates in PICA. 6.  Aortic Atherosclerosis (ICD10-I70.0). Electronically Signed   By: Odessa Fleming M.D.   On: 02/07/2022 09:53   CT  HEAD CODE STROKE WO CONTRAST  Result Date: 02/07/2022 CLINICAL DATA:  Code stroke. 88 year old male with leftward gaze, aphasia, right side weakness. EXAM: CT HEAD WITHOUT CONTRAST TECHNIQUE: Contiguous axial images were obtained from the base of the skull through the vertex without intravenous contrast. RADIATION DOSE REDUCTION: This exam was performed according to the departmental dose-optimization program which includes automated exposure control, adjustment of the mA and/or kV according to patient size and/or use of iterative reconstruction technique. COMPARISON:  Brain MRI 06/21/2013.  Head CT 05/03/2014. FINDINGS: Brain: Early loss of normal gray-white matter differentiation in the left MCA territory visible at the M 5 segment on series 3 images 21 and 24. Heterogeneity in the left basal ganglia and anterior external capsule is chronic and stable. No acute intracranial hemorrhage identified. No midline shift, mass effect, or evidence of intracranial mass lesion. No ventriculomegaly. Gray-white matter differentiation outside of the M 5 segment appears stable with patchy chronic white matter hypodensity in both hemispheres. Vascular: Extensive Calcified atherosclerosis at the skull base. New density associated with an left MCA branch in the sylvian fissure on series 3, image 17 but might be calcified atherosclerosis. No other No suspicious intracranial vascular hyperdensity. Skull: No acute osseous abnormality identified. Sinuses/Orbits: Visualized paranasal sinuses and mastoids are stable and well aerated. Other: No gaze deviation on these images. No acute orbit or scalp soft tissue finding. ASPECTS [facility] Stroke Program Early CT Score) - Ganglionic level infarction (caudate, lentiform nuclei, internal capsule, insula, M1-M3 cortex): 7 - Supraganglionic infarction (M4-M6 cortex): 2 Total score (0-10 with 10 being normal): 9 IMPRESSION: 1. Subtle cytotoxic edema suspected in the Left MCA territory M5  segment. ASPECTS 9. 2. No intracranial hemorrhage or mass effect. Underlying chronic small vessel disease. Equivocal hyperdensity of a left MCA branch in the sylvian fissure. 3. Study discussed by telephone with Dr. Rhunette Croft on 02/07/2022 at 09:26 . Electronically Signed   By: Odessa Fleming M.D.   On: 02/07/2022 09:27    Micro Results  Recent Results (from the past 240 hour(s))  Culture, blood (Routine X 2) w Reflex to ID Panel     Status: None   Collection Time: 02/10/22 11:11 PM   Specimen: BLOOD LEFT HAND  Result Value Ref Range Status   Specimen Description BLOOD LEFT HAND  Final   Special Requests   Final    BOTTLES DRAWN AEROBIC AND ANAEROBIC Blood Culture adequate volume   Culture   Final    NO GROWTH 5 DAYS Performed at [facility], [address] [place], [place]  [address]    Report Status 02/15/2022 FINAL  Final  Culture, blood (Routine X 2) w Reflex to ID Panel     Status: None   Collection Time: 02/10/22 11:15 PM   Specimen: BLOOD RIGHT HAND  Result Value Ref Range Status   Specimen Description BLOOD RIGHT HAND  Final   Special Requests   Final    BOTTLES DRAWN AEROBIC AND ANAEROBIC Blood Culture adequate volume   Culture   Final    NO GROWTH 5 DAYS Performed at [facility], [address] [place], [place] [address]    Report Status 02/15/2022 FINAL  Final   Today   Subjective    Carollee Massed today has no new complaints -Speech difficulties persist -Swallowing is okay but not great   Patient has been seen and examined prior to discharge   Objective   Blood pressure (!) 177/77, pulse 81, temperature 99.6 F (37.6 C), resp. rate 18, height 5\' 5"  (1.651 m), weight 67.9 kg, SpO2 96 %.   Intake/Output Summary (Last 24 hours) at 02/17/2022 1206 Last data filed at 02/17/2022 0900 Gross per 24 hour  Intake 1386.48 ml  Output 600 ml  Net 786.48 ml   Exam Gen:- Awake Alert, no acute distress  HEENT:- [place].AT, No sclera icterus Neck-Supple Neck,No JVD,.  Lungs-  CTAB ,  good air movement bilaterally CV- S1, S2 normal, regular Abd-  +ve B.Sounds, Abd Soft, No tenderness,    Extremity/Skin:- No  edema,   good pulses Psych-affect is appropriate, oriented x3 Neuro-facial asymmetry, motor aphasia persist, swallowing difficulties improving   Data Review   CBC w Diff:  Lab Results  Component Value Date   WBC 10.2 02/16/2022   HGB 12.9 (L) 02/16/2022   HGB 12.1 (L) 08/20/2021   HCT 40.8 02/16/2022   HCT 36.5 (L) 08/20/2021   PLT 276 02/16/2022   PLT 202 08/20/2021   LYMPHOPCT 14 02/07/2022   MONOPCT 8 02/07/2022   EOSPCT 1 02/07/2022   BASOPCT 0 02/07/2022   CMP:  Lab Results  Component Value Date   NA 145 02/17/2022   NA 137 08/20/2021   K 3.7 02/17/2022   CL 109 02/17/2022   CO2 27 02/17/2022   BUN 32 (H) 02/17/2022   BUN 13 08/20/2021   CREATININE 0.81 02/17/2022   PROT 6.9 02/13/2022   PROT 6.2 08/20/2021   ALBUMIN 2.9 (L) 02/17/2022   ALBUMIN 4.3 08/20/2021   BILITOT 1.2 02/13/2022   BILITOT 0.4 08/20/2021   ALKPHOS 69 02/13/2022   AST 58 (H) 02/13/2022   ALT 36 02/13/2022    Total Discharge time is about 33 minutes  Shon Hale M.D on 02/17/2022 at 12:06 PM  Go to [email or web address] -  for contact info  [facility] - Office  [phone]

## 2022-02-17 NOTE — Progress Notes (Addendum)
Inpatient Rehabilitation Admission Medication Review by a Pharmacist  A complete drug regimen review was completed for this patient to identify any potential clinically significant medication issues.  High Risk Drug Classes Is patient taking? Indication by Medication  Antipsychotic Yes Compazine - prn N/V  Anticoagulant Yes Lovenox - VTE ppx  Antibiotic No   Opioid No   Antiplatelet Yes Clopidogrel, Aspirin - CVA   Hypoglycemics/insulin No   Vasoactive Medication Yes Metoprolol - BP Finasteride - BPH  Chemotherapy No   Other Yes Atorvastatin - HLD Xalatan, Alphagan - glaucoma Pantoprazole - GERD     Type of Medication Issue Identified Description of Issue Recommendation(s)  Drug Interaction(s) (clinically significant)     Duplicate Therapy     Allergy     No Medication Administration End Date     Incorrect Dose     Additional Drug Therapy Needed     Significant med changes from prior encounter (inform family/care partners about these prior to discharge). Cosopt eye drops Tamsulosin 0.4 mg daily Resume?  Other       Clinically significant medication issues were identified that warrant physician communication and completion of prescribed/recommended actions by midnight of the next day:  No  Pharmacist comments: Resume tamsulosin (stopped 8/20) / Cosopt eye drops  Time spent performing this drug regimen review (minutes):  30 minutes   Thank you Okey Regal, PharmD

## 2022-02-18 DIAGNOSIS — I63512 Cerebral infarction due to unspecified occlusion or stenosis of left middle cerebral artery: Secondary | ICD-10-CM | POA: Diagnosis not present

## 2022-02-18 LAB — CBC WITH DIFFERENTIAL/PLATELET
Abs Immature Granulocytes: 0.3 10*3/uL — ABNORMAL HIGH (ref 0.00–0.07)
Basophils Absolute: 0 10*3/uL (ref 0.0–0.1)
Basophils Relative: 0 %
Eosinophils Absolute: 0.1 10*3/uL (ref 0.0–0.5)
Eosinophils Relative: 1 %
HCT: 38 % — ABNORMAL LOW (ref 39.0–52.0)
Hemoglobin: 12.5 g/dL — ABNORMAL LOW (ref 13.0–17.0)
Immature Granulocytes: 4 %
Lymphocytes Relative: 20 %
Lymphs Abs: 1.6 10*3/uL (ref 0.7–4.0)
MCH: 32 pg (ref 26.0–34.0)
MCHC: 32.9 g/dL (ref 30.0–36.0)
MCV: 97.2 fL (ref 80.0–100.0)
Monocytes Absolute: 0.6 10*3/uL (ref 0.1–1.0)
Monocytes Relative: 7 %
Neutro Abs: 5.4 10*3/uL (ref 1.7–7.7)
Neutrophils Relative %: 68 %
Platelets: 220 10*3/uL (ref 150–400)
RBC: 3.91 MIL/uL — ABNORMAL LOW (ref 4.22–5.81)
RDW: 11.7 % (ref 11.5–15.5)
WBC: 8 10*3/uL (ref 4.0–10.5)
nRBC: 0 % (ref 0.0–0.2)

## 2022-02-18 LAB — SARS CORONAVIRUS 2 BY RT PCR: SARS Coronavirus 2 by RT PCR: POSITIVE — AB

## 2022-02-18 LAB — COMPREHENSIVE METABOLIC PANEL
ALT: 30 U/L (ref 0–44)
AST: 46 U/L — ABNORMAL HIGH (ref 15–41)
Albumin: 2.4 g/dL — ABNORMAL LOW (ref 3.5–5.0)
Alkaline Phosphatase: 56 U/L (ref 38–126)
Anion gap: 8 (ref 5–15)
BUN: 26 mg/dL — ABNORMAL HIGH (ref 8–23)
CO2: 28 mmol/L (ref 22–32)
Calcium: 8.5 mg/dL — ABNORMAL LOW (ref 8.9–10.3)
Chloride: 106 mmol/L (ref 98–111)
Creatinine, Ser: 0.75 mg/dL (ref 0.61–1.24)
GFR, Estimated: >60 mL/min (ref >60)
Glucose, Bld: 101 mg/dL — ABNORMAL HIGH (ref 70–99)
Potassium: 3.2 mmol/L — ABNORMAL LOW (ref 3.5–5.1)
Sodium: 142 mmol/L (ref 135–145)
Total Bilirubin: 0.6 mg/dL (ref 0.3–1.2)
Total Protein: 5.5 g/dL — ABNORMAL LOW (ref 6.5–8.1)

## 2022-02-18 MED ORDER — POTASSIUM CHLORIDE 20 MEQ PO PACK
40.0000 meq | PACK | Freq: Every day | ORAL | Status: AC
  Administered 2022-02-18: 40 meq via ORAL
  Filled 2022-02-18: qty 2

## 2022-02-18 MED ORDER — PANTOPRAZOLE 2 MG/ML SUSPENSION
40.0000 mg | Freq: Every day | ORAL | Status: DC
  Administered 2022-02-18 – 2022-03-11 (×22): 40 mg via ORAL
  Filled 2022-02-18 (×23): qty 20

## 2022-02-18 MED ORDER — LIDOCAINE HCL URETHRAL/MUCOSAL 2 % EX GEL
1.0000 | CUTANEOUS | Status: DC | PRN
  Filled 2022-02-18: qty 6

## 2022-02-18 MED ORDER — POTASSIUM CHLORIDE CRYS ER 20 MEQ PO TBCR
20.0000 meq | EXTENDED_RELEASE_TABLET | Freq: Two times a day (BID) | ORAL | Status: DC
Start: 2022-02-19 — End: 2022-02-18

## 2022-02-18 MED ORDER — POTASSIUM CHLORIDE 20 MEQ PO PACK
20.0000 meq | PACK | Freq: Two times a day (BID) | ORAL | Status: AC
Start: 2022-02-19 — End: 2022-02-19
  Administered 2022-02-19 (×2): 20 meq via ORAL
  Filled 2022-02-18 (×2): qty 1

## 2022-02-18 MED ORDER — POTASSIUM CHLORIDE CRYS ER 20 MEQ PO TBCR: 40.0000 meq | EXTENDED_RELEASE_TABLET | Freq: Once | ORAL | Status: DC

## 2022-02-18 NOTE — Evaluation (Signed)
Occupational Therapy Assessment and Plan  Patient Details  Name: Paul [facility] MRN: [id number] Date of Birth: 1925-07-31  OT Diagnosis: abnormal posture, apraxia, cognitive deficits, disturbance of vision, flaccid hemiplegia and hemiparesis, and hemiplegia affecting dominant side Rehab Potential: Rehab Potential (ACUTE ONLY): Fair ELOS: 21-22 days   Today's Date: 02/18/2022 OT Individual Time: 1791-5056 OT Individual Time Calculation (min): 60 min     [facility] Problem: Principal Problem:   Acute ischemic left middle cerebral artery (MCA) stroke ([place])   Past Medical History:  Past Medical History:  Diagnosis Date   Aortic regurgitation    Moderate   Arthritis    BPH (benign prostatic hyperplasia)    Cervical disc disease    Cervical radiculopathy    Hyperlipidemia    Hypertension    Prediabetes 02/16/2021   Staphylococcus aureus bacteremia 08/09/2012   TEE negative for vegetation or thrombus February 2014   Stroke Paul [facility]) 2005 or 2006   3   Symptomatic carotid artery stenosis with infarction ([place]) 2005 or 2006   Status post right carotid endarterectomy   Past Surgical History:  Past Surgical History:  Procedure Laterality Date   BACK SURGERY     CAROTID ENDARTERECTOMY     CATARACT EXTRACTION W/PHACO Left 02/15/2015   Procedure: CATARACT EXTRACTION PHACO AND INTRAOCULAR LENS PLACEMENT LEFT EYE CDE=30.93;  Surgeon: Tonny Branch, MD;  Location: [facility];  Service: Ophthalmology;  Laterality: Left;   CHOLECYSTECTOMY     EYE SURGERY     KIDNEY STONE SURGERY     LUMBAR LAMINECTOMY/DECOMPRESSION MICRODISCECTOMY Bilateral 01/23/2014   Procedure: LUMBAR LAMINECTOMY/DECOMPRESSION MICRODISCECTOMY 1 LEVEL L5-S1;  Surgeon: Charlie Pitter, MD;  Location: [facility];  Service: Neurosurgery;  Laterality: Bilateral;  LUMBAR LAMINECTOMY/DECOMPRESSION MICRODISCECTOMY 1 LEVEL L5-S1   TEE WITHOUT CARDIOVERSION N/A 08/12/2012   Procedure: TRANSESOPHAGEAL ECHOCARDIOGRAM (TEE);  Surgeon: Josue Hector, MD;  Location: [facility];  Service: Cardiovascular;  Laterality: N/A;   TEE WITHOUT CARDIOVERSION N/A 06/24/2013   Procedure: TRANSESOPHAGEAL ECHOCARDIOGRAM (TEE);  Surgeon: Satira Sark, MD;  Location: [facility];  Service: Endoscopy;  Laterality: N/A;    Assessment & Plan Clinical Impression:  Paul [facility] is a 88 year old male with history of HTN, BPH, pre-diabetes, chronic back pain with radiculopathy who was admitted to [facility] on 02/07/22 with right sided weakness and inability to talk. He was found to have acute L-MCA stroke with  core infarct relative to penumbra and near occlusion of L-ICA  with acute thrombus left siphon with severe stenosis and multiple MCA branch occlusions. Patient felt to be  poor candidate for intervention. Family elected on DNR and did not want to pursue aggressive interventions. 2D echo done revealing EF 65-70% with V He was started on D1, nectar which was down graded to honey on 08/24 due to premature spillage with delay in swallow initiation.    Neurology recommends DAPT X 3 months followed by ASA alone.  He has had fevers which is resolving with improvement in leucocytosis. He was noted to be stable neurologically and is showing improvement in alertness but continues to be limited by RUE weakness, right facial droop and expressive> receptive aphasia. CIR recommended due to functional decline.  Patient transferred to CIR on 02/17/2022 .    Patient currently requires total with basic self-care skills secondary to abnormal tone and motor apraxia, decreased visual perceptual skills and field cut, decreased attention to right, decreased attention, decreased awareness, decreased problem solving, decreased safety awareness, decreased memory,  and delayed processing, and decreased sitting balance, decreased standing balance, decreased postural control, hemiplegia, and decreased balance strategies.  Prior to hospitalization, patient was independent and took care  of his wife with dementia.  Patient will benefit from skilled intervention to increase independence with basic self-care skills prior to discharge home with care partner.  Anticipate patient will require minimal physical assistance and follow up home health.  OT - End of Session Activity Tolerance: Tolerates 10 - 20 min activity with multiple rests Endurance Deficit: Yes OT Assessment Rehab Potential (ACUTE ONLY): Fair OT Patient demonstrates impairments in the following area(s): Balance;Cognition;Endurance;Motor;Perception;Safety;Sensory;Vision OT Basic ADL's Functional Problem(s): Eating;Grooming;Bathing;Dressing;Toileting OT Transfers Functional Problem(s): Toilet;Tub/Shower OT Additional Impairment(s): Fuctional Use of Upper Extremity OT Plan OT Intensity: Minimum of 1-2 x/day, 45 to 90 minutes OT Frequency: 5 out of 7 days OT Duration/Estimated Length of Stay: 21-22 days OT Treatment/Interventions: Balance/vestibular training;Cognitive remediation/compensation;Discharge planning;DME/adaptive equipment instruction;Neuromuscular re-education;Functional mobility training;Patient/family education;Psychosocial support;Self Care/advanced ADL retraining;Therapeutic Activities;Therapeutic Exercise;UE/LE Strength taining/ROM;UE/LE Coordination activities;Visual/perceptual remediation/compensation OT Self Feeding Anticipated Outcome(s): supervision OT Basic Self-Care Anticipated Outcome(s): min A OT Toileting Anticipated Outcome(s): Min A OT Bathroom Transfers Anticipated Outcome(s): Min A OT Recommendation Patient destination: Home Follow Up Recommendations: Home health OT;24 hour supervision/assistance Equipment Recommended: 3 in 1 bedside comode;Tub/shower bench   OT Evaluation Precautions/Restrictions  Precautions Precautions: Fall Precaution Comments: aphasia - unable to express himself Restrictions Weight Bearing Restrictions: No    Vital Signs Therapy Vitals Temp: 98.6 F (37  C) Temp Source: Oral Pulse Rate: 82 Resp: 18 BP: (!) 122/57 Patient Position (if appropriate): Sitting Oxygen Therapy SpO2: 98 % O2 Device: Room Air Pain Pain Assessment Pain Scale: 0-10 Pain Score: 0-No pain Faces Pain Scale: No hurt Home Living/Prior Functioning Home Living Living Arrangements: Spouse/significant other Available Help at Discharge: Family, Available 24 hours/day Type of Home: House Home Access: Stairs to enter, Ramped entrance Technical brewer of Steps: 6 Entrance Stairs-Rails: Right Home Layout: One level Bathroom Shower/Tub: Chiropodist: Standard Additional Comments: info taken from document review, pt unable to express himself and no family present  Lives With: Spouse (he was caregiver for his wife with dementia) Prior Function Level of Independence: Independent with basic ADLs, Independent with gait, Independent with transfers Vision Baseline Vision/History: 1 Wears glasses Ability to See in Adequate Light: 2 Moderately impaired Patient Visual Report: Other (comment) (pt unable to report if he has visual changes) Vision Assessment?: Yes Eye Alignment: Within Functional Limits Ocular Range of Motion: Within Functional Limits Alignment/Gaze Preference: Gaze left Tracking/Visual Pursuits: Impaired - to be further tested in functional context;Other (comment) (pt unable to follow directions to assess visual tracking but per observation pt did track his eyes around room) Visual Fields: Other (comment) (unable to accurately assess, per observation he has a R field cut) Perception  Perception: Impaired Inattention/Neglect: Does not attend to right visual field Praxis Praxis: Impaired Praxis Impairment Details: Motor planning Cognition Cognition Overall Cognitive Status: Impaired/Different from baseline (unable to assess due to aphasia, unable to make verbalizations) Arousal/Alertness: Lethargic Orientation Level:  Nonverbal/unable to assess Attention: Focused;Sustained Focused Attention: Appears intact Sustained Attention: Appears intact Comments: Difficult to assess cognition due to expressive/receptive aphasia Brief Interview for Mental Status (BIMS) Repetition of Three Words (First Attempt): No answer (unable to assess due to aphasia, unable to make verbalizations) Temporal Orientation: Year: No answer (unable to assess due to aphasia, unable to make verbalizations) Temporal Orientation: Month: No answer (unable to assess due to aphasia, unable to make verbalizations) Temporal Orientation:  Day: No answer (unable to assess due to aphasia, unable to make verbalizations) Recall: "Sock": No answer (unable to assess due to aphasia, unable to make verbalizations) Recall: "Blue": No answer (unable to assess due to aphasia, unable to make verbalizations) Recall: "Bed": No answer (unable to assess due to aphasia, unable to make verbalizations) BIMS Summary Score: 99 Sensation Sensation Light Touch: Not tested Hot/Cold: Not tested Proprioception: Not tested Stereognosis: Not tested Additional Comments: limited assessment due to aphasia Coordination Gross Motor Movements are Fluid and Coordinated: No Fine Motor Movements are Fluid and Coordinated: No Coordination and Movement Description: dense RUE hemiplegia, decreased strength RLE Finger Nose Finger Test: unable to assess Motor  Motor Motor: Hemiplegia  Trunk/Postural Assessment  Cervical Assessment Cervical Assessment: Exceptions to [facility] (forward flexed head) Thoracic Assessment Thoracic Assessment: Exceptions to [facility] (rounded shoulders, unable to extend spine in standing) Lumbar Assessment Lumbar Assessment: Exceptions to [facility] (posterior pelvic tilt) Postural Control Postural Control: Deficits on evaluation Trunk Control: limited strength with forward flexion and limited trunk extension in standing, pt stands in significant flexion   Balance Static Sitting Balance Static Sitting - Level of Assistance: 5: Stand by assistance Dynamic Sitting Balance Dynamic Sitting - Level of Assistance: 3: Mod assist Static Standing Balance Static Standing - Level of Assistance: 3: Mod assist Dynamic Standing Balance Dynamic Standing - Level of Assistance: 1: +1 Total assist Extremity/Trunk Assessment RUE Assessment RUE Body System: Neuro Brunstrum levels for arm and hand: Arm;Hand Brunstrum level for arm: Stage I Presynergy Brunstrum level for hand: Stage I Flaccidity RUE Tone RUE Tone: Hypotonic LUE Assessment LUE Assessment: Within Functional Limits  Care Tool Care Tool Self Care Eating   Eating Assist Level: Maximal Assistance - Patient 25 - 49%    Oral Care    Oral Care Assist Level: Dependent - Patient 0%)    Bathing   Body parts bathed by patient: Chest;Face;Front perineal area;Right arm;Abdomen;Right upper leg;Left upper leg Body parts bathed by helper: Left arm;Buttocks;Right lower leg;Left lower leg   Assist Level: Moderate Assistance - Patient 50 - 74%    Upper Body Dressing(including orthotics)   What is the patient wearing?: [facility] gown only   Assist Level: Maximal Assistance - Patient 25 - 49%    Lower Body Dressing (excluding footwear)   What is the patient wearing?: Incontinence brief;Pants Assist for lower body dressing: Total Assistance - Patient < 25%    Putting on/Taking off footwear   What is the patient wearing?: Non-skid slipper socks Assist for footwear: Dependent - Patient 0%       Care Tool Toileting Toileting activity   Assist for toileting: Total Assistance - Patient < 25%     Care Tool Bed Mobility Roll left and right activity   Roll left and right assist level: Moderate Assistance - Patient 50 - 74%    Sit to lying activity   Sit to lying assist level: Moderate Assistance - Patient 50 - 74%    Lying to sitting on side of bed activity         Care Tool Transfers Sit  to stand transfer   Sit to stand assist level: Moderate Assistance - Patient 50 - 74%    Chair/bed transfer   Chair/bed transfer assist level: Maximal Assistance - Patient 25 - 49%     Toilet transfer   Assist Level: Maximal Assistance - Patient 24 - 49%     Care Tool Cognition  Expression of Ideas and Wants Expression of Ideas and Wants:  1. Rarely/Never expressess or very difficult - rarely/never expresses self or speech is very difficult to understand  Understanding Verbal and Non-Verbal Content Understanding Verbal and Non-Verbal Content: 2. Sometimes understands - understands only basic conversations or simple, direct phrases. Frequently requires cues to understand   Memory/Recall Ability Memory/Recall Ability : None of the above were recalled   Refer to Care Plan for Long Term Goals  SHORT TERM GOAL WEEK 1 OT Short Term Goal 1 (Week 1): Pt will be able to sit to stand to prep for LB dressing/ toileting with min A. OT Short Term Goal 2 (Week 1): Pt will be able to stand pivot to [facility] with mod A. OT Short Term Goal 3 (Week 1): Pt will be able to stand upright with min A to increase safety with standing during clothing management. OT Short Term Goal 4 (Week 1): Pt will don shirt with mod A. OT Short Term Goal 5 (Week 1): Pt will demonstrate awareness of RUE by moving R arm into position with LUE when cued.  Recommendations for other services: None    Skilled Therapeutic Intervention ADL ADL Eating: Maximal assistance Grooming: Maximal assistance Upper Body Bathing: Moderate assistance Where Assessed-Upper Body Bathing: Other (Comment) (BSC) Lower Body Bathing: Maximal assistance Where Assessed-Lower Body Bathing: Other (Comment) (BSC) Upper Body Dressing: Maximal assistance Lower Body Dressing: Dependent Toileting: Dependent Where Assessed-Toileting: Bedside Commode Toilet Transfer: Maximal assistance Toilet Transfer Method: Stand pivot Toilet Transfer Equipment: Bedside  commode Mobility  Transfers Sit to Stand: Moderate Assistance - Patient 50-74% Stand to Sit: Moderate Assistance - Patient 50-74%  Pt seen for initial evaluation and initiation of ADLs.  Pt is severely limited in his receptive and expressive abilities so much of the assessment was limited.  Pt did respond to 1 step commands that he could "understand" such as sit up to EOB, stand up on the count of 3, step to chair. He had more difficulty with unfamiliar tasks such as "follow this target with your eyes".  Did explain who I was and role of OT, pt did nod in agreement when I mentioned he was very independent before.  See ADL documentation above, pt completed toileting, bathing, dressing and transfers.    Pt resting in recliner with pillows to support arm, belt alarm on and all needs met.    Discharge Criteria: Patient will be discharged from OT if patient refuses treatment 3 consecutive times without medical reason, if treatment goals not met, if there is a change in medical status, if patient makes no progress towards goals or if patient is discharged from [facility].  The above assessment, treatment plan, treatment alternatives and goals were discussed and mutually agreed upon: No family available/patient unable  [place] 02/18/2022, 1:20 PM

## 2022-02-18 NOTE — Progress Notes (Addendum)
PROGRESS NOTE   Subjective/Complaints:  Aphasic Notified by admission coordinator, pt had Covid 19 exposure from son's girlfriend sho recently tested positive, pt without cough, low grade temp ~99 at noc   ROS- unable to assess due to aphasia   Objective:   No results found. Recent Labs    02/16/22 0527 02/18/22 0505  WBC 10.2 8.0  HGB 12.9* 12.5*  HCT 40.8 38.0*  PLT 276 220   Recent Labs    02/17/22 1351 02/18/22 0505  NA 143 142  K 4.1 3.2*  CL 109 106  CO2 26 28  GLUCOSE 101* 101*  BUN 32* 26*  CREATININE 0.76 0.75  CALCIUM 8.4* 8.5*    Intake/Output Summary (Last 24 hours) at 02/18/2022 0743 Last data filed at 02/17/2022 1900 Gross per 24 hour  Intake 480 ml  Output --  Net 480 ml     Pressure Injury 02/07/22 Perineum Medial Stage 1 -  Intact skin with non-blanchable redness of a localized area usually over a bony prominence. 2 1/2in by 1 inch redness, non blanching (Active)  02/07/22 1327  Location: Perineum  Location Orientation: Medial  Staging: Stage 1 -  Intact skin with non-blanchable redness of a localized area usually over a bony prominence.  Wound Description (Comments): 2 1/2in by 1 inch redness, non blanching  Present on Admission: Yes    Physical Exam: Vital Signs Blood pressure (!) 153/75, pulse 81, temperature 98.6 F (37 C), temperature source Oral, resp. rate 20, height 5\' 5"  (1.651 m), weight 64.4 kg, SpO2 97 %.   General: No acute distress Mood and affect are appropriate Heart: Regular rate and rhythm no rubs murmurs or extra sounds Lungs: Clear to auscultation, breathing unlabored, no rales or wheezes Abdomen: Positive bowel sounds, soft nontender to palpation, nondistended Extremities: No clubbing, cyanosis, or edema Skin: No evidence of breakdown, no evidence of rash Neurologic: Cranial nerves II through XII intact, motor strength is 5/5 in bilateral deltoid, bicep,  tricep, grip, hip flexor, knee extensors, ankle dorsiflexor and plantar flexor Sensory exam unable to assess , aphasia  Musculoskeletal: pain with RIght finger ext , mild dorsal hand edema on RIght    Assessment/Plan: 1. Functional deficits which require 3+ hours per day of interdisciplinary therapy in a comprehensive inpatient rehab setting. Physiatrist is providing close team supervision and 24 hour management of active medical problems listed below. Physiatrist and rehab team continue to assess barriers to discharge/monitor patient progress toward functional and medical goals  Care Tool:  Bathing              Bathing assist       Upper Body Dressing/Undressing Upper body dressing        Upper body assist      Lower Body Dressing/Undressing Lower body dressing            Lower body assist       Toileting Toileting    Toileting assist       Transfers Chair/bed transfer  Transfers assist           Locomotion Ambulation   Ambulation assist  Walk 10 feet activity   Assist           Walk 50 feet activity   Assist           Walk 150 feet activity   Assist           Walk 10 feet on uneven surface  activity   Assist           Wheelchair     Assist               Wheelchair 50 feet with 2 turns activity    Assist            Wheelchair 150 feet activity     Assist          Blood pressure (!) 153/75, pulse 81, temperature 98.6 F (37 C), temperature source Oral, resp. rate 20, height 5\' 5"  (1.651 m), weight 64.4 kg, SpO2 97 %.  Medical Problem List and Plan: 1. Functional deficits secondary to left MCA/ICA stroke with dense RUE HP and expressive aphasia             -patient may shower             -ELOS/Goals: 10-14 days, supervision PT, sup/min OT, min to mod assist SLP             -WHO for RUE 2.  Antithrombotics: -DVT/anticoagulation:  Pharmaceutical: Lovenox              -antiplatelet therapy: DAPT X 3 months followed by ASA alone.  3. Pain Management: Tylenol prn.  4. Mood/Behavior/Sleep: LCSW to follow for evaluation and support.              -antipsychotic agents: N/A 5. Neuropsych/cognition: This patient is not fully capable of making decisions on his own behalf. 6. Skin/Wound Care: Routine pressure relief measures.  7. Fluids/Electrolytes/Nutrition: Monitor I/O. Check CMET in am for follow up on AKI/LFTs.             --Assist with/encourage honey thick fluid intake.  HypoK+ will supplement , not on diuretics but had several stools on 8/27 8. L-MCA infarct with hemorrhagic conversion: DAPT X 90 day followed by ASA alone 9. HTN: Monitor BP TID. BP remains labile. Resume Proscar.  --Continue Metoprolol--was titrated upwards 08/27 for better control.  Vitals:   02/17/22 1939 02/18/22 0623  BP: (!) 147/68 (!) 153/75  Pulse: 93 81  Resp: 19 20  Temp: 99.2 F (37.3 C) 98.6 F (37 C)  SpO2: 97% 97%    10. Dysphagia: Continue D1, honey thick liquids. Needs assistance for feeding and supervision for safety.  --hypernatremia/AKI likely due to dysphagia diet 11. BPH: Monitor for any voiding difficulties. Will order PVR checks as off his meds.  --Proscar was not resumed. Flomax was d/c on 08/20.  12. Pre-renal azotemia: Offer fluids between meals.  --Indicated thirst -->drank honey liquids without negative expression.  -supplement with IVF if needed 13. Fever/Leucocytosis: Was monitored and has resolved. Continues to have low grade elevation at nights likely atelectasis.   Covid exposure will put on precautions pending testing x 2  --continue to monitor for any signs of infection/aspiration.     Latest Ref Rng & Units 02/18/2022    5:05 AM 02/16/2022    5:27 AM 02/13/2022    6:11 AM  CBC  WBC 4.0 - 10.5 K/uL 8.0  10.2  10.4   Hemoglobin 13.0 - 17.0 g/dL 02/15/2022  38.1  01.7   Hematocrit  39.0 - 52.0 % 38.0  40.8  38.9   Platelets 150 - 400 K/uL 220  276   248        LOS: 1 days A FACE TO FACE EVALUATION WAS PERFORMED  Erick Colace 02/18/2022, 7:43 AM

## 2022-02-18 NOTE — Plan of Care (Signed)
  Problem: RH Swallowing Goal: LTG Patient will consume least restrictive diet using compensatory strategies with assistance (SLP) Description: LTG:  Patient will consume least restrictive diet using compensatory strategies with assistance (SLP) Flowsheets (Taken 02/18/2022 1307) LTG: Pt Patient will consume least restrictive diet using compensatory strategies with assistance of (SLP): Minimal Assistance - Patient > 75% Goal: LTG Patient will participate in dysphagia therapy to increase swallow function with assistance (SLP) Description: LTG:  Patient will participate in dysphagia therapy to increase swallow function with assistance (SLP) Flowsheets (Taken 02/18/2022 1307) LTG: Pt will participate in dysphagia therapy to increase swallow function with assistance of (SLP): Minimal Assistance - Patient > 75% Goal: LTG Pt will demonstrate functional change in swallow as evidenced by bedside/clinical objective assessment (SLP) Description: LTG: Patient will demonstrate functional change in swallow as evidenced by bedside/clinical objective assessment (SLP) Flowsheets (Taken 02/18/2022 1307) LTG: Patient will demonstrate functional change in swallow as evidenced by bedside/clinical objective assessment: Oropharyngeal swallow   Problem: RH Comprehension Communication Goal: LTG Patient will comprehend basic/complex auditory (SLP) Description: LTG: Patient will comprehend basic/complex auditory information with cues (SLP). 02/18/2022 1308 by Sonny Masters T, CCC-SLP Flowsheets (Taken 02/18/2022 1308) LTG: Patient will comprehend auditory information with cueing (SLP):  Minimal Assistance - Patient > 75%  Moderate Assistance - Patient 50 - 74% 02/18/2022 1307 by Sonny Masters T, CCC-SLP Flowsheets (Taken 02/18/2022 1307) LTG: Patient will comprehend: Basic auditory information LTG: Patient will comprehend auditory information with cueing (SLP): Moderate Assistance - Patient 50 - 74%   Problem:  RH Expression Communication Goal: LTG Patient will express needs/wants via multi-modal(SLP) Description: LTG:  Patient will express needs/wants via multi-modal communication (gestures/written, etc) with cues (SLP) Flowsheets (Taken 02/18/2022 1308) LTG: Patient will express needs/wants via multimodal communication (gestures/written, etc) with cueing (SLP): Moderate Assistance - Patient 50 - 74% Goal: LTG Patient will increase word finding of common (SLP) Description: LTG:  Patient will increase word finding of common objects/daily info/abstract thoughts with cues using compensatory strategies (SLP). Flowsheets (Taken 02/18/2022 1308) LTG: Patient will increase word finding of common (SLP): Maximal Assistance - Patient 25 - 49%

## 2022-02-18 NOTE — Evaluation (Addendum)
Speech Language Pathology Assessment and Plan  Patient Details  Name: DAYMEON FISCHMAN MRN: [id number] Date of Birth: [birth year]  SLP Diagnosis: Aphasia;Cognitive Impairments;Speech and Language deficits;Dysphagia  Rehab Potential: Fair ELOS: 3 weeks   Today's Date: 02/18/2022 SLP Individual Time: 4580-9983 SLP Individual Time Calculation (min): 35 min and Today's Date: 02/18/2022 SLP Missed Time: 25 Minutes Missed Time Reason: Other (Comment) (Obtaining appropriate PPE on unit due to airborne precautions)  Hospital Problem: Principal Problem:   Acute ischemic left middle cerebral artery (MCA) stroke ([place])  Past Medical History:  Past Medical History:  Diagnosis Date   Aortic regurgitation    Moderate   Arthritis    BPH (benign prostatic hyperplasia)    Cervical disc disease    Cervical radiculopathy    Hyperlipidemia    Hypertension    Prediabetes 02/16/2021   Staphylococcus aureus bacteremia 08/09/2012   TEE negative for vegetation or thrombus February 2014   Stroke [facility]) 2005 or 2006   3   Symptomatic carotid artery stenosis with infarction ([place]) 2005 or 2006   Status post right carotid endarterectomy   Past Surgical History:  Past Surgical History:  Procedure Laterality Date   BACK SURGERY     CAROTID ENDARTERECTOMY     CATARACT EXTRACTION W/PHACO Left 02/15/2015   Procedure: CATARACT EXTRACTION PHACO AND INTRAOCULAR LENS PLACEMENT LEFT EYE CDE=30.93;  Surgeon: Tonny Branch, MD;  Location: [facility];  Service: Ophthalmology;  Laterality: Left;   CHOLECYSTECTOMY     EYE SURGERY     KIDNEY STONE SURGERY     LUMBAR LAMINECTOMY/DECOMPRESSION MICRODISCECTOMY Bilateral 01/23/2014   Procedure: LUMBAR LAMINECTOMY/DECOMPRESSION MICRODISCECTOMY 1 LEVEL L5-S1;  Surgeon: Charlie Pitter, MD;  Location: [place] [facility];  Service: Neurosurgery;  Laterality: Bilateral;  LUMBAR LAMINECTOMY/DECOMPRESSION MICRODISCECTOMY 1 LEVEL L5-S1   TEE WITHOUT CARDIOVERSION N/A 08/12/2012   Procedure:  TRANSESOPHAGEAL ECHOCARDIOGRAM (TEE);  Surgeon: Josue Hector, MD;  Location: [facility];  Service: Cardiovascular;  Laterality: N/A;   TEE WITHOUT CARDIOVERSION N/A 06/24/2013   Procedure: TRANSESOPHAGEAL ECHOCARDIOGRAM (TEE);  Surgeon: Satira Sark, MD;  Location: [facility];  Service: Endoscopy;  Laterality: N/A;    Assessment / Plan / Recommendation Clinical Impression Jejuan L. Ironside is a 88 year old male with history of HTN, BPH, pre-diabetes, chronic back pain with radiculopathy who was admitted to [facility] on 02/07/22 with right sided weakness and inability to talk. He was found to have acute L-MCA stroke with  core infarct relative to penumbra and near occlusion of L-ICA with acute thrombus left siphon with severe stenosis and multiple MCA branch occlusions. Patient felt to be poor candidate for intervention. Family elected on DNR and did not want to pursue aggressive interventions. He was started on D1, nectar which was down graded to honey on 08/24 due to premature spillage with delay in swallow initiation. He was noted to be stable neurologically and is showing improvement in alertness but continues to be limited by RUE weakness, right facial droop and expressive> receptive aphasia. CIR recommended due to functional decline.   Pt presents with severe expressive and at least moderate receptive aphasia. Pt was not known to verbalize or approximate voicing attempts during assessment. Primary communication was through non-verbal means through head nods. Pt exhibited difficulty following commands for oral motor assessment. Cannot rule out presence of oral apraxia however difficult to assess secondary to receptive language deficits. Pt nodded head to biographical yes/no questions with 61% accuracy and followed 1-step commands with 33% accuracy; unable to  follow 2-step. Pt was known to perseverate on previous command. Pt was unable to successfully identify field of 2 common objects. Question  visual impairment. Difficult to assess orientation or other cognitive factors due to severe language impairments. Language assessment will be ongoing. Limited this date due to time constraints.   Pt consumed honey thick liquids by cup with adequate oral containment, suspected mild transit delay, and without pharyngeal symptoms concerning for airway invasion. Pt also seen consuming crushed medications in puree with timely swallow response, adequate oral clearance, and without s/sx of aspiration. Recommend continuation of dysphagia 1 diet with hone thick liquids and full supervision with all PO intake. Cont meds crushed in puree.   SLP recommends skilled ST intervention to address language, cognitive-linguistic, and dysphagia needs n order to maximize functional independence.   Skilled Therapeutic Interventions          Speech-language, cognitive, and swallow assessments were completed. Please see above.  SLP Assessment  Patient will need skilled Speech Lanaguage Pathology Services during CIR admission    Recommendations  SLP Diet Recommendations: Dysphagia 1 (Puree);Honey Liquid Administration via: Cup Medication Administration: Crushed with puree Supervision: Staff to assist with self feeding;Full supervision/cueing for compensatory strategies Compensations: Minimize environmental distractions;Slow rate;Lingual sweep for clearance of pocketing;Monitor for anterior loss;Multiple dry swallows after each bite/sip;Clear throat intermittently Postural Changes and/or Swallow Maneuvers: Seated upright 90 degrees;Upright 30-60 min after meal Oral Care Recommendations: Oral care BID;Oral care before and after PO;Staff/trained caregiver to provide oral care Patient destination: Home Follow up Recommendations: 24 hour supervision/assistance;Home Health SLP Equipment Recommended: To be determined    SLP Frequency 3 to 5 out of 7 days   SLP Duration  SLP Intensity  SLP Treatment/Interventions 3  weeks  Minumum of 1-2 x/day, 30 to 90 minutes  Cognitive remediation/compensation;Cueing hierarchy;Dysphagia/aspiration precaution training;Functional tasks;Internal/external aids;Multimodal communication approach;Patient/family education;Speech/Language facilitation;Therapeutic Activities    Pain Pain Assessment Pain Scale: 0-10 Pain Score: 0-No pain  Prior Functioning Cognitive/Linguistic Baseline: Information not available Type of Home: House  Lives With: Spouse (unknown) Available Help at Discharge: Family;Available 24 hours/day  SLP Evaluation Cognition Overall Cognitive Status: Impaired/Different from baseline Arousal/Alertness: Awake/alert Orientation Level: Oriented to person Year:  (unable to assess d/t expressive/receptive language deficits) Month:  (unable to assess d/t expressive/receptive language deficits) Attention: Focused;Sustained Focused Attention: Appears intact Sustained Attention: Appears intact Comments: Difficult to assess cognition due to expressive/receptive aphasia  Comprehension Auditory Comprehension Overall Auditory Comprehension: Impaired Yes/No Questions: Impaired Basic Biographical Questions: 51-75% accurate (61%) Basic Immediate Environment Questions: 25-49% accurate Complex Questions: 0-24% accurate Commands: Impaired One Step Basic Commands: 25-49% accurate (33%) Two Step Basic Commands: 0-24% accurate (0) Visual Recognition/Discrimination Discrimination: Not tested Reading Comprehension Reading Status: Unable to assess (comment) Expression Expression Primary Mode of Expression: Nonverbal - gestures Verbal Expression Overall Verbal Expression: Impaired Initiation: Impaired Automatic Speech:  (unable) Repetition: Impaired Level of Impairment: Word level Naming: Impairment Responsive: 0-25% accurate (0) Confrontation: Impaired Convergent: 0-24% accurate (0) Non-Verbal Means of Communication: Gestures Written  Expression Dominant Hand: Right Written Expression: Unable to assess (comment) Oral Motor Oral Motor/Sensory Function Overall Oral Motor/Sensory Function: Moderate impairment Facial ROM: Reduced right Facial Symmetry: Abnormal symmetry right Facial Strength: Reduced right Facial Sensation: Reduced right Lingual ROM: Reduced right Motor Speech Overall Motor Speech: Other (comment) (unable to assess; no vocalization/verbalizations during session)  Care Tool Care Tool Cognition Ability to hear (with hearing aid or hearing appliances if normally used Ability to hear (with hearing aid or hearing appliances if normally used): 1.  Minimal difficulty - difficulty in some environments (e.g. when person speaks softly or setting is noisy)   Expression of Ideas and Wants Expression of Ideas and Wants: 1. Rarely/Never expressess or very difficult - rarely/never expresses self or speech is very difficult to understand   Understanding Verbal and Non-Verbal Content Understanding Verbal and Non-Verbal Content: 2. Sometimes understands - understands only basic conversations or simple, direct phrases. Frequently requires cues to understand  Memory/Recall Ability Memory/Recall Ability : None of the above were recalled   Bedside Swallowing Assessment General Date of Onset: 02/07/22 Previous Swallow Assessment: 02/13/2022 MBS Diet Prior to this Study: Dysphagia 1 (puree);Honey-thick liquids Temperature Spikes Noted: No Respiratory Status: Room air Behavior/Cognition: Alert;Cooperative;Requires cueing Oral Cavity - Dentition: Dentures, top;Missing dentition Self-Feeding Abilities: Able to feed self;Needs assist Vision: Functional for self-feeding Patient Positioning: Upright in bed Baseline Vocal Quality: Not observed Volitional Cough: Cognitively unable to elicit Volitional Swallow: Unable to elicit  Oral Care Assessment Oral Assessment  (WDL): Exceptions to WDL Lips: Asymmetrical Teeth: Missing  (Comment) Tongue: Pink;Moist Mucous Membrane(s): Moist;Pink Saliva: Moist, saliva free flowing Level of Consciousness: Alert Is patient on any of following O2 devices?: None of the above Nutritional status: Dysphagia Oral Assessment Risk : High Risk Ice Chips Ice chips: Not tested Thin Liquid Thin Liquid: Not tested Nectar Thick Nectar Thick Liquid: Not tested Honey Thick Honey Thick Liquid: Impaired Pharyngeal Phase Impairments: Suspected delayed Swallow Puree Puree: Impaired Oral Phase Functional Implications: Prolonged oral transit Solid Solid: Not tested BSE Assessment Risk for Aspiration Impact on safety and function: Moderate aspiration risk;Risk for inadequate nutrition/hydration Other Related Risk Factors: Cognitive impairment;Other (comment) (aphasia)  Short Term Goals: Week 1: SLP Short Term Goal 1 (Week 1): Patient will participate in therapeutic PO trials with minimal overt s/sx of aspiration with mod A verbal cues for implementation of swallowing safety SLP Short Term Goal 2 (Week 1): Patient will respond to biographical and environmental yes/no questions with 75% accuracy given moderate A verbal cues SLP Short Term Goal 3 (Week 1): Patient will follow one-step commands with 50% accuracy given max A multimodal cues SLP Short Term Goal 4 (Week 1): Patient will identify field of 2 objects with 25% accuracy given max A multimodal cues SLP Short Term Goal 5 (Week 1): Pt will vocalize during 10% of opportunities during speech/language tasks with max A multimodal cues SLP Short Term Goal 6 (Week 1): Pt will communicate functional needs through multimodal means with max A multimodal cues  Refer to Care Plan for Long Term Goals  Recommendations for other services: None   Discharge Criteria: Patient will be discharged from SLP if patient refuses treatment 3 consecutive times without medical reason, if treatment goals not met, if there is a change in medical status, if  patient makes no progress towards goals or if patient is discharged from hospital.  The above assessment, treatment plan, treatment alternatives and goals were discussed and mutually agreed upon: No family available/patient unable  Patty Sermons 02/18/2022, 12:57 PM

## 2022-02-18 NOTE — Evaluation (Signed)
Physical Therapy Assessment and Plan  Patient Details  Name: Paul Bradshaw MRN: [id number] Date of Birth: [birth year]  PT Diagnosis: Difficulty walking, Dizziness and giddiness, Hemiparesis dominant, Impaired cognition, Impaired sensation, and Muscle weakness Rehab Potential: Fair ELOS: 2-3 weeks   Today's Date: 02/18/2022 PT Individual Time: 2334-3568 PT Individual Time Calculation (min): 71 min    Hospital Problem: Principal Problem:   Acute ischemic left middle cerebral artery (MCA) stroke ([place])   Past Medical History:  Past Medical History:  Diagnosis Date   Aortic regurgitation    Moderate   Arthritis    BPH (benign prostatic hyperplasia)    Cervical disc disease    Cervical radiculopathy    Hyperlipidemia    Hypertension    Prediabetes 02/16/2021   Staphylococcus aureus bacteremia 08/09/2012   TEE negative for vegetation or thrombus February 2014   Stroke [facility]) 2005 or 2006   3   Symptomatic carotid artery stenosis with infarction ([place]) 2005 or 2006   Status post right carotid endarterectomy   Past Surgical History:  Past Surgical History:  Procedure Laterality Date   BACK SURGERY     CAROTID ENDARTERECTOMY     CATARACT EXTRACTION W/PHACO Left 02/15/2015   Procedure: CATARACT EXTRACTION PHACO AND INTRAOCULAR LENS PLACEMENT LEFT EYE CDE=30.93;  Surgeon: Tonny Branch, MD;  Location: [facility];  Service: Ophthalmology;  Laterality: Left;   CHOLECYSTECTOMY     EYE SURGERY     KIDNEY STONE SURGERY     LUMBAR LAMINECTOMY/DECOMPRESSION MICRODISCECTOMY Bilateral 01/23/2014   Procedure: LUMBAR LAMINECTOMY/DECOMPRESSION MICRODISCECTOMY 1 LEVEL L5-S1;  Surgeon: Charlie Pitter, MD;  Location: [place] NEURO ORS;  Service: Neurosurgery;  Laterality: Bilateral;  LUMBAR LAMINECTOMY/DECOMPRESSION MICRODISCECTOMY 1 LEVEL L5-S1   TEE WITHOUT CARDIOVERSION N/A 08/12/2012   Procedure: TRANSESOPHAGEAL ECHOCARDIOGRAM (TEE);  Surgeon: Josue Hector, MD;  [facility];  Service:  Cardiovascular;  Laterality: N/A;   TEE WITHOUT CARDIOVERSION N/A 06/24/2013   Procedure: TRANSESOPHAGEAL ECHOCARDIOGRAM (TEE);  Surgeon: Satira Sark, MD;  Location: [facility];  Service: Endoscopy;  Laterality: N/A;    Assessment & Plan Clinical Impression: Patient is a 88 y.o. male with history of HTN, BPH, pre-diabetes, chronic back pain with radiculopathy who was admitted to [facility] on 02/07/22 with right sided weakness and inability to talk. He was found to have acute L-MCA stroke with  core infarct relative to penumbra and near occlusion of L-ICA  with acute thrombus left siphon with severe stenosis and multiple MCA branch occlusions. Patient felt to be  poor candidate for intervention. Family elected on DNR and did not want to pursue aggressive interventions. 2D echo done revealing EF 65-70% with V He was started on D1, nectar which was down graded to honey on 08/24 due to premature spillage with delay in swallow initiation.    Neurology recommends DAPT X 3 months followed by ASA alone.  He has had fevers which is resolving with improvement in leucocytosis. He was noted to be stable neurologically and is showing improvement in alertness but continues to be limited by RUE weakness, right facial droop and expressive> receptive aphasia. CIR recommended due to functional decline.  Patient transferred to CIR on 02/17/2022 .   Patient currently requires mod assist with mobility secondary to muscle weakness, decreased cardiorespiratoy endurance, unbalanced muscle activation and decreased coordination, field cut, decreased attention to right and decreased motor planning, decreased initiation, decreased awareness, decreased safety awareness, delayed processing, and expressive aphasia, and decreased standing balance and decreased balance strategies.  Prior to hospitalization, patient was independent  with mobility and lived with Spouse (he was caregiver for his wife with dementia) in a House home.  Home  access is 6Stairs to enter, Ramped entrance.  Patient will benefit from skilled PT intervention to maximize safe functional mobility, minimize fall risk, and decrease caregiver burden for planned discharge home with 24 hour assist.  Anticipate patient will benefit from follow up [facility] at discharge.  PT - End of Session Activity Tolerance: Tolerates 10 - 20 min activity with multiple rests Endurance Deficit: Yes PT Assessment Rehab Potential (ACUTE/IP ONLY): Fair PT Barriers to Discharge: Inaccessible home environment;Decreased caregiver support;Home environment access/layout;Lack of/limited family support;Insurance for SNF coverage PT Patient demonstrates impairments in the following area(s): Balance;Endurance;Motor;Nutrition;Perception;Safety;Sensory;Skin Integrity PT Transfers Functional Problem(s): Bed Mobility;Bed to Chair;Car;Furniture PT Locomotion Functional Problem(s): Ambulation;Wheelchair Mobility;Stairs PT Plan PT Intensity: Minimum of 1-2 x/day ,45 to 90 minutes PT Frequency: 5 out of 7 days PT Duration Estimated Length of Stay: 2-3 weeks PT Treatment/Interventions: Ambulation/gait training;Discharge planning;Psychosocial support;Functional mobility training;Therapeutic Activities;Visual/perceptual remediation/compensation;Wheelchair propulsion/positioning;Therapeutic Exercise;Skin care/wound management;Neuromuscular re-education;Disease management/prevention;Balance/vestibular training;Cognitive remediation/compensation;Pain management;DME/adaptive equipment instruction;Splinting/orthotics;UE/LE Strength taining/ROM;UE/LE Coordination activities;Stair training;Patient/family education;Functional electrical stimulation;Community reintegration PT Transfers Anticipated Outcome(s): CGA PT Locomotion Anticipated Outcome(s): MinA PT Recommendation Follow Up Recommendations: Home health PT;24 hour supervision/assistance Patient destination: Home Equipment Recommended: To be  determined   PT Evaluation Precautions/Restrictions Precautions Precautions: Fall Precaution Comments: R hemipareisis, expressive>receptive aphasia Restrictions Weight Bearing Restrictions: No General   Vital Signs Pain Pain Assessment Pain Scale: 0-10 Pain Score: 0-No pain Pain Interference Pain Interference Pain Effect on Sleep: 0. Does not apply - I have not had any pain or hurting in the past 5 days Pain Interference with Therapy Activities: 0. Does not apply - I have not received rehabilitationtherapy in the past 5 days Pain Interference with Day-to-Day Activities: 1. Rarely or not at all Home Living/Prior [place] Available Help at Discharge: Family;Available 24 hours/day Type of Home: House Home Access: Stairs to enter;Ramped entrance Entrance Stairs-Number of Steps: 6 Entrance Stairs-Rails: Right Home Layout: One level Bathroom Shower/Tub: Chiropodist: Standard Additional Comments: info taken from document review and pt nodding/ shaking head in answer to questions, pt unable to fully express himself and no family present  Lives With: Spouse (he was caregiver for his wife with dementia) Prior Function Level of Independence: Independent with basic ADLs;Independent with gait;Independent with transfers  Able to Take Stairs?: Yes Driving: Yes Vision/Perception  Vision - History Ability to See in Adequate Light: 2 Moderately impaired Vision - Assessment Eye Alignment: Within Functional Limits Ocular Range of Motion: Restricted looking down (Is able to follow in upper quadrants but not in lower quadrants) Alignment/Gaze Preference: Gaze left Tracking/Visual Pursuits: Other (comment);Impaired - to be further tested in functional context (Tracks bilaterally to follow in upper quadrants but not in lower quadrants) Perception Perception: Impaired Inattention/Neglect: Does not attend to right visual field Praxis Praxis: Impaired Praxis  Impairment Details: Motor planning;Initiation Praxis-Other Comments: easy cues such as "scoot forward", "stand up" are performed well; heel to shin test and some MMT instructions not carried out.  Cognition Overall Cognitive Status: Impaired/Different from baseline Arousal/Alertness: Lethargic Orientation Level: Oriented to person Attention: Focused Focused Attention: Appears intact Memory: Impaired Memory Impairment: Decreased recall of new information Comments: Difficult to assess cognition due to expressive/receptive aphasia Sensation Sensation Light Touch:  (Tested but pt unable to clearly relate sensation) Additional Comments: limited assessment due to aphasia Coordination Gross Motor Movements are Fluid and Coordinated: No  Fine Motor Movements are Fluid and Coordinated: No Coordination and Movement Description: dense RUE hemiplegia, decreased strength RLE Heel Shin Test: unable to follow verbal instructions Motor  Motor Motor: Other (comment) (hemipareisis) Motor - Skilled Clinical Observations: motor planning good with rote vc; not able to follow instructions for easy but non-practiced movements (heel-to-shin test)   Trunk/Postural Assessment  Cervical Assessment Cervical Assessment: Exceptions to [facility] (forward head with forward flexion, mildly improved with time following vc) Thoracic Assessment Thoracic Assessment: Exceptions to [facility] (rounded shoulders with forward flexion; improved with time following vc) Lumbar Assessment Lumbar Assessment: Exceptions to [facility] (posterior pelvic tilt with sacral sitting on entrance to room, improved throughout eval) Postural Control Postural Control: Deficits on evaluation Trunk Control: Pt initiates stance in significant postural flexion, mildly improves with time following vc for upright stance  Balance Static Sitting Balance Static Sitting - Level of Assistance: 5: Stand by assistance Dynamic Sitting Balance Dynamic Sitting - Level of  Assistance: 3: Mod assist;4: Min assist Static Standing Balance Static Standing - Level of Assistance: 4: Min assist Dynamic Standing Balance Dynamic Standing - Level of Assistance: 3: Mod assist;4: Min assist Extremity Assessment      RLE Assessment RLE Assessment: Exceptions to [facility] RLE Strength RLE Overall Strength: Deficits Right Hip Flexion: 4-/5 Right Hip Extension: 4-/5 Right Hip ABduction: 4/5 Right Hip ADduction: 4/5 Right Knee Flexion: 3/5 Right Knee Extension: 4/5 Right Ankle Dorsiflexion: 4+/5 Right Ankle Plantar Flexion: 4/5 LLE Assessment LLE Assessment: Exceptions to WFL LLE Strength LLE Overall Strength: Deficits Left Hip Flexion: 4+/5 Left Hip Extension: 4/5 Left Hip ABduction: 4+/5 Left Hip ADduction: 4+/5 Left Knee Flexion: 3+/5 Left Knee Extension: 4+/5 Left Ankle Dorsiflexion: 4+/5 Left Ankle Plantar Flexion: 4/5  Care Tool Care Tool Bed Mobility Roll left and right activity   Roll left and right assist level: Minimal Assistance - Patient > 75%    Sit to lying activity   Sit to lying assist level: Minimal Assistance - Patient > 75%    Lying to sitting on side of bed activity   Lying to sitting on side of bed assist level: the ability to move from lying on the back to sitting on the side of the bed with no back support.: Minimal Assistance - Patient > 75%     Care Tool Transfers Sit to stand transfer   Sit to stand assist level: Moderate Assistance - Patient 50 - 74%    Chair/bed transfer   Chair/bed transfer assist level: Minimal Assistance - Patient > 75%     Toilet transfer   Assist Level: Maximal Assistance - Patient 24 - 49%    Car transfer Car transfer activity did not occur: Safety/medical concerns        Care Tool Locomotion Ambulation Ambulation activity did not occur: Safety/medical concerns        Walk 10 feet activity Walk 10 feet activity did not occur: Safety/medical concerns       Walk 50 feet with 2 turns activity  Walk 50 feet with 2 turns activity did not occur: Safety/medical concerns      Walk 150 feet activity Walk 150 feet activity did not occur: Safety/medical concerns      Walk 10 feet on uneven surfaces activity Walk 10 feet on uneven surfaces activity did not occur: Safety/medical concerns      Stairs Stair activity did not occur: Safety/medical concerns        Walk up/down 1 step activity Walk up/down 1 step or curb (  drop down) activity did not occur: Safety/medical concerns      Walk up/down 4 steps activity Walk up/down 4 steps activity did not occur: Safety/medical concerns      Walk up/down 12 steps activity Walk up/down 12 steps activity did not occur: Safety/medical concerns      Pick up small objects from floor Pick up small object from the floor (from standing position) activity did not occur: Safety/medical concerns      Wheelchair Is the patient using a wheelchair?: No (Did not use one PTA) Type of Wheelchair: Manual Wheelchair activity did not occur: Safety/medical concerns      Wheel 50 feet with 2 turns activity Wheelchair 50 feet with 2 turns activity did not occur: Safety/medical concerns    Wheel 150 feet activity Wheelchair 150 feet activity did not occur: Safety/medical concerns      Refer to Care Plan for Long Term Goals  SHORT TERM GOAL WEEK 1 PT Short Term Goal 1 (Week 1): Pt will perform bed mobility with overall CGA and using no bed features. PT Short Term Goal 2 (Week 1): Pt will perform all functional transfers with light MinA/ CGA and LRAD. PT Short Term Goal 3 (Week 1): Pt will ambulate at least 40 ft using LRAD with CGA/ MinA. PT Short Term Goal 4 (Week 1): Pt will initiate stair training. PT Short Term Goal 5 (Week 1): Pt will perform Berg Balance test.  Recommendations for other services: None at this time.   Skilled Therapeutic Intervention Mobility Bed Mobility Bed Mobility: Sit to Supine;Supine to Sit Supine to Sit: Minimal  Assistance - Patient > 75% Sit to Supine: Minimal Assistance - Patient > 75% Transfers Transfers: Sit to Stand;Stand to Sit;Stand Pivot Transfers Sit to Stand: Moderate Assistance - Patient 50-74% Stand to Sit: Minimal Assistance - Patient > 75% Stand Pivot Transfers: Minimal Assistance - Patient > 75% Stand Pivot Transfer Details: Verbal cues for safe use of DME/AE;Verbal cues for precautions/safety;Tactile cues for placement;Verbal cues for technique Transfer (Assistive device): Rolling walker (RW to LUE and HHA to RUE) Locomotion  Gait Ambulation: No Gait Gait: No Stairs / Additional Locomotion Stairs: No Wheelchair Mobility Wheelchair Mobility: No  Skilled Intervention: PT Evaluation completed; see above for results. PT educated patient in roles of PT vs OT, PT POC, rehab potential, rehab goals, and discharge recommendations along with recommendation for follow-up rehabilitation services. Individual treatment initiated:  Patient supine in recliner upon PT arrival. Patient alert and agreeable to PT session. No pain complaint during session. 18x18" w/c acquired at hemi-height with RUE half lap tray for UE support.   Therapeutic Activity: Bed Mobility: Patient performed supine to sit with assist from [place]. At end of session returns to supine in bed with MinA for BLE to reach bed surface. Provided verbal cues for technique throughout. Transfers: Patient performed sit <> stand recliner <> RW for LUE support and HHA for RUE support with Min/ ModA. Descent to sit with MinA and vc for technique. On second sit<>stand, pt improves to MinA overall with extensive cues for technique.   Neuromuscular Re-ed: NMR facilitated during session with focus on standing balance/ tolerance. Pt guided in sit<>stands, minisquats, BLE step outs with no indication of R knee buckling. LUE hold onto RW handle and RUE supported by elbow support. Initial stance with flxed posture and cued for upright  stance. Nods head to agree but slow to initiate. Eventually does demo slight improvement overall and is able to hold head erect  to look forward. NMR performed for improvements in motor control and coordination, balance, sequencing, judgement, and self confidence/ efficacy in performing all aspects of mobility at highest level of independence.   Therapeutic Exercise: MMT performed with pt able to follow instructions for majority of movements, but difficulty with following verbal instructions for knee flexion and true strength noted in functional mobility.   Patient supine  in bed at end of session with brakes locked, bed alarm set, and all needs within reach. Pt relates thirst and desire for cranberry juice via head nodding when provided with options. Honey thick cranberry juice provided with HOB elevated. Pt able to hold drink himself but unaware of liquid spilling from R side of mouth. Cued for smaller sips with improved hold of liquid. Pt finishes cup and NT notified of pt's thirst and potential need for add'l drinks provided and supervised throughout each day/ shift.   Discharge Criteria: Patient will be discharged from PT if patient refuses treatment 3 consecutive times without medical reason, if treatment goals not met, if there is a change in medical status, if patient makes no progress towards goals or if patient is discharged from hospital.  The above assessment, treatment plan, treatment alternatives and goals were discussed and mutually agreed upon: by patient  Alger Simons PT, DPT, CSRS 02/18/2022, 5:35 PM

## 2022-02-18 NOTE — Progress Notes (Signed)
Inpatient Rehabilitation  Patient information reviewed and entered into eRehab system by Abrish Erny M. Nyliah Nierenberg, M.A., CCC/SLP, PPS Coordinator.  Information including medical coding, functional ability and quality indicators will be reviewed and updated through discharge.    

## 2022-02-18 NOTE — Progress Notes (Signed)
Inpatient Rehabilitation Care Coordinator Assessment and Plan Patient Details  Name: Paul Bradshaw MRN: [id number] Date of Birth: June 11, 1926  Today's Date: 02/18/2022  Hospital Problems: Principal Problem:   Acute ischemic left middle cerebral artery (MCA) stroke [facility])  Past Medical History:  Past Medical History:  Diagnosis Date   Aortic regurgitation    Moderate   Arthritis    BPH (benign prostatic hyperplasia)    Cervical disc disease    Cervical radiculopathy    Hyperlipidemia    Hypertension    Prediabetes 02/16/2021   Staphylococcus aureus bacteremia 08/09/2012   TEE negative for vegetation or thrombus February 2014   Stroke [facility]) 2005 or 2006   3   Symptomatic carotid artery stenosis with infarction [facility]) 2005 or 2006   Status post right carotid endarterectomy   Past Surgical History:  Past Surgical History:  Procedure Laterality Date   BACK SURGERY     CAROTID ENDARTERECTOMY     CATARACT EXTRACTION W/PHACO Left 02/15/2015   Procedure: CATARACT EXTRACTION PHACO AND INTRAOCULAR LENS PLACEMENT LEFT EYE CDE=30.93;  Surgeon: Tonny Branch, MD;  Location: [facility];  Service: Ophthalmology;  Laterality: Left;   CHOLECYSTECTOMY     EYE SURGERY     KIDNEY STONE SURGERY     LUMBAR LAMINECTOMY/DECOMPRESSION MICRODISCECTOMY Bilateral 01/23/2014   Procedure: LUMBAR LAMINECTOMY/DECOMPRESSION MICRODISCECTOMY 1 LEVEL L5-S1;  Surgeon: Charlie Pitter, MD;  Location: [place] NEURO ORS;  Service: Neurosurgery;  Laterality: Bilateral;  LUMBAR LAMINECTOMY/DECOMPRESSION MICRODISCECTOMY 1 LEVEL L5-S1   TEE WITHOUT CARDIOVERSION N/A 08/12/2012   Procedure: TRANSESOPHAGEAL ECHOCARDIOGRAM (TEE);  Surgeon: Josue Hector, MD;  [facility];  Service: Cardiovascular;  Laterality: N/A;   TEE WITHOUT CARDIOVERSION N/A 06/24/2013   Procedure: TRANSESOPHAGEAL ECHOCARDIOGRAM (TEE);  Surgeon: Satira Sark, MD;  [facility];  Service: Endoscopy;  Laterality: N/A;   Social History:   reports that he has never smoked. He has never used smokeless tobacco. He reports that he does not drink alcohol and does not use drugs.  Family / Support Systems Children: Legrand Como (Son), Other Supports: Marden Noble, Jacqlyn Larsen (granddaughter), Kieth Brightly (granddaughter), Einar Pheasant Yolanda Bonine) Anticipated Caregiver: Legrand Como (son) and friend [name] Ability/Limitations of Caregiver: none Caregiver Availability: 24/7 Family Dynamics: support from son, daughter and grandchildren  Social History Preferred language: English Religion: [place] - How often do you need to have someone help you when you read instructions, pamphlets, or other written material from your doctor or pharmacy?: Patient unable to respond Writes: Yes   Abuse/Neglect Abuse/Neglect Assessment Can Be Completed: Unable to assess, patient is non-responsive or altered mental status  Patient response to: Social Isolation - How often do you feel lonely or isolated from those around you?: Patient unable to respond  Emotional Status Recent Psychosocial Issues: coping Psychiatric History: n/a Substance Abuse History: n/a  Patient / Family Perceptions, Expectations & Goals Pt/Family understanding of illness & functional limitations: yes Premorbid pt/family roles/activities: Previously independent and caring for spouse with dementia Anticipated changes in roles/activities/participation: son anticipates providing supervision/care for patient at d/c Pt/family expectations/goals: Supervision to [employer]: None Premorbid Home Care/DME Agencies: Other (Comment) (SPC and RW) Transportation available at discharge: family able to transport In the past 12 months, has lack of transportation kept you from medical appointments or from getting medications?: No In the past 12 months, has lack of transportation kept you from meetings, work, or from getting things needed for daily living?:  No  Discharge Planning Living Arrangements: Spouse/significant other  Support Systems: Spouse/significant other, Children, Other relatives Type of Residence: Private residence (Patient has 1 level ramped entrance home. Son lives on 1st level apartment) Insurance Resources: Multimedia programmer (specify) Financial Resources: Social Security Financial Screen Referred: No Living Expenses: Own Money Management: Patient Does the patient have any problems obtaining your medications?: No Home Management: Independent Patient/Family Preliminary Plans: Son and friend able to assist if needed DC Planning Additional Notes/Comments: Patient previously caring for spouse, daughter is now caring for spouse. Son will care for patient. APS called due to caregiver issues Expected length of stay: 10-14 Days  Clinical Impression Sw met with patient, introduced self and explained role. Sw will meet with patient and discuss with daughter conference updates. No additional questions or concerns.  Dyanne Iha 02/18/2022, 12:25 PM

## 2022-02-18 NOTE — Progress Notes (Signed)
[facility]patient Rehabilitation Center Individual Statement of Services  Patient Name:  Paul Bradshaw  Date:  02/18/2022  Welcome to the Inpatient Rehabilitation Center.  Our goal is to provide you with an individualized program based on your diagnosis and situation, designed to meet your specific needs.  With this comprehensive rehabilitation program, you will be expected to participate in at least 3 hours of rehabilitation therapies Monday-Friday, with modified therapy programming on the weekends.  Your rehabilitation program will include the following services:  Physical Therapy (PT), Occupational Therapy (OT), Speech Therapy (ST), 24 hour per day rehabilitation nursing, Therapeutic Recreaction (TR), Neuropsychology, Care Coordinator, Rehabilitation Medicine, Nutrition Services, Pharmacy Services, and Other  Weekly team conferences will be held on Wednesdays to discuss your progress.  Your Inpatient Rehabilitation Care Coordinator will talk with you frequently to get your input and to update you on team discussions.  Team conferences with you and your family in attendance may also be held.  Expected length of stay: 10-14 Days  Overall anticipated outcome:  Supervision to Min A  Depending on your progress and recovery, your program may change. Your Inpatient Rehabilitation Care Coordinator will coordinate services and will keep you informed of any changes. Your Inpatient Rehabilitation Care Coordinator's name and contact numbers are listed  below.  The following services may also be recommended but are not provided by the Inpatient Rehabilitation Center:   Home Health Rehabiltiation Services Outpatient Rehabilitation Services    Arrangements will be made to provide these services after discharge if needed.  Arrangements include referral to agencies that provide these services.  Your insurance has been verified to be:  [facility] MEDICARE Your primary doctor is:  Gilford Silvius, FNP  Pertinent information  will be shared with your doctor and your insurance company.  Inpatient Rehabilitation Care Coordinator:  Lavera Guise, [place] [phone] or [phone]  Information discussed with and copy given to patient by: Andria Rhein, 02/18/2022, 11:33 AM

## 2022-02-18 NOTE — Plan of Care (Signed)
Problem: RH Balance Goal: LTG: Patient will maintain dynamic sitting balance (OT) Description: LTG:  Patient will maintain dynamic sitting balance with assistance during activities of daily living (OT) Flowsheets (Taken 02/18/2022 1552) LTG: Pt will maintain dynamic sitting balance during ADLs with: Supervision/Verbal cueing Goal: LTG Patient will maintain dynamic standing with ADLs (OT) Description: LTG:  Patient will maintain dynamic standing balance with assist during activities of daily living (OT)  Flowsheets (Taken 02/18/2022 1552) LTG: Pt will maintain dynamic standing balance during ADLs with: Contact Guard/Touching assist   Problem: Sit to Stand Goal: LTG:  Patient will perform sit to stand in prep for activites of daily living with assistance level (OT) Description: LTG:  Patient will perform sit to stand in prep for activites of daily living with assistance level (OT) Flowsheets (Taken 02/18/2022 1552) LTG: PT will perform sit to stand in prep for activites of daily living with assistance level: Contact Guard/Touching assist   Problem: RH Eating Goal: LTG Patient will perform eating w/assist, cues/equip (OT) Description: LTG: Patient will perform eating with assist, with/without cues using equipment (OT) Flowsheets (Taken 02/18/2022 1552) LTG: Pt will perform eating with assistance level of: Supervision/Verbal cueing   Problem: RH Grooming Goal: LTG Patient will perform grooming w/assist,cues/equip (OT) Description: LTG: Patient will perform grooming with assist, with/without cues using equipment (OT) Flowsheets (Taken 02/18/2022 1552) LTG: Pt will perform grooming with assistance level of: Supervision/Verbal cueing   Problem: RH Bathing Goal: LTG Patient will bathe all body parts with assist levels (OT) Description: LTG: Patient will bathe all body parts with assist levels (OT) Flowsheets (Taken 02/18/2022 1552) LTG: Pt will perform bathing with assistance level/cueing:  Minimal Assistance - Patient > 75%   Problem: RH Dressing Goal: LTG Patient will perform upper body dressing (OT) Description: LTG Patient will perform upper body dressing with assist, with/without cues (OT). Flowsheets (Taken 02/18/2022 1552) LTG: Pt will perform upper body dressing with assistance level of: Supervision/Verbal cueing Goal: LTG Patient will perform lower body dressing w/assist (OT) Description: LTG: Patient will perform lower body dressing with assist, with/without cues in positioning using equipment (OT) Flowsheets (Taken 02/18/2022 1552) LTG: Pt will perform lower body dressing with assistance level of: Minimal Assistance - Patient > 75%   Problem: RH Toileting Goal: LTG Patient will perform toileting task (3/3 steps) with assistance level (OT) Description: LTG: Patient will perform toileting task (3/3 steps) with assistance level (OT)  Flowsheets (Taken 02/18/2022 1552) LTG: Pt will perform toileting task (3/3 steps) with assistance level: Minimal Assistance - Patient > 75%   Problem: RH Vision Goal: RH LTG Vision Consulting civil engineer) Flowsheets (Taken 02/18/2022 1552) LTG: Vision Goals: Pt will attend to items in his R visual field with minimal cues.   Problem: RH Functional Use of Upper Extremity Goal: LTG Patient will use RT/LT upper extremity as a (OT) Description: LTG: Patient will use right/left upper extremity as a stabilizer/gross assist/diminished/nondominant/dominant level with assist, with/without cues during functional activity (OT) Flowsheets (Taken 02/18/2022 1552) LTG: Use of upper extremity in functional activities: RUE as a stabilizer LTG: Pt will use upper extremity in functional activity with assistance level of: Supervision/Verbal cueing   Problem: RH Toilet Transfers Goal: LTG Patient will perform toilet transfers w/assist (OT) Description: LTG: Patient will perform toilet transfers with assist, with/without cues using equipment (OT) Flowsheets (Taken  02/18/2022 1552) LTG: Pt will perform toilet transfers with assistance level of: Minimal Assistance - Patient > 75%   Problem: RH Tub/Shower Transfers Goal: LTG Patient will perform tub/shower  transfers w/assist (OT) Description: LTG: Patient will perform tub/shower transfers with assist, with/without cues using equipment (OT) Flowsheets (Taken 02/18/2022 1552) LTG: Pt will perform tub/shower stall transfers with assistance level of: Minimal Assistance - Patient > 75%

## 2022-02-18 NOTE — Progress Notes (Signed)
Orthopedic Tech Progress Note Patient Details:  Paul Bradshaw Feb 14, 1926 [id number]  Called in order to [employer] for a [employer]   Patient ID: [name], male   DOB: 12-05-1925, 88 y.o.   MRN: [id number]  Donald Pore 02/18/2022, 9:09 AM

## 2022-02-19 DIAGNOSIS — I63512 Cerebral infarction due to unspecified occlusion or stenosis of left middle cerebral artery: Secondary | ICD-10-CM | POA: Diagnosis not present

## 2022-02-19 NOTE — Progress Notes (Signed)
Initial Nutrition Assessment  DOCUMENTATION CODES:   Severe malnutrition in context of acute illness/injury  INTERVENTION:  Continue current diet per SLP, advance as able Magic cup TID with meals, each supplement provides 290 kcal and 9 grams of protein Add additional foods as supplements to each meal trays (ie grits, pudding, etc) MVI with minerals daily  NUTRITION DIAGNOSIS:   Severe Malnutrition (in the context of acute illness) related to  (inadequate energy intake) as evidenced by moderate fat depletion, moderate muscle depletion, percent weight loss (11% x 1 month).  GOAL:   Patient will meet greater than or equal to 90% of their needs  MONITOR:   PO intake, Labs, I & O's, Supplement acceptance, Diet advancement  REASON FOR ASSESSMENT:   Malnutrition Screening Tool    ASSESSMENT:   Pt with hx of HTN, HLD, pre-diabetes, and hx of CVAs admitted to [facility] after presenting to [facility] on 8/18 with right sided weakness and inability to talk. 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  Pt resting in bed with eyes closed at the time of assessment. Did not interact during visit despite having name called. Muscle and fat deficits noted on exam. Weight loss of ~11% in the last month. Fair intake recorded in flow sheet. Will add additional items to meal tray as pt already receives magic cup with all meals.  Pt noted to be cool to the touch, RN provided with warm blankets and increased temperature of room.    Average Meal Intake: 8/28-8/30: 62% intake x 9 recorded meals  Nutritionally Relevant Medications: Scheduled Meds:  atorvastatin  10 mg Oral q1800   pantoprazole  40 mg Oral Daily   potassium chloride  20 mEq Oral BID   Labs Reviewed: K 3.2 BUN 26  NUTRITION - FOCUSED PHYSICAL EXAM:  Flowsheet Row Most Recent Value  Orbital Region Moderate depletion  Upper Arm Region Moderate depletion  Thoracic and Lumbar Region Mild depletion  Buccal Region Moderate depletion  Temple  Region Moderate depletion  Clavicle Bone Region Moderate depletion  Clavicle and Acromion Bone Region Mild depletion  Scapular Bone Region Mild depletion  Dorsal Hand Mild depletion  Patellar Region Severe depletion  Anterior Thigh Region Severe depletion  Posterior Calf Region Severe depletion  Edema (RD Assessment) None  Hair Reviewed  Eyes Reviewed  Mouth Reviewed  Skin Reviewed  Nails Reviewed    Diet Order:   Diet Order             DIET - DYS 1 Room service appropriate? Yes; Fluid consistency: Honey Thick  Diet effective now                   EDUCATION NEEDS:   Not appropriate for education at this time  Skin:  Skin Assessment: Skin Integrity Issues: Skin Integrity Issues:: Stage I Stage I: perineum area  Last BM:  8/30 - type 5  Height:  Ht Readings from Last 1 Encounters:  02/17/22 5\' 5"  (1.651 m)    Weight:  Wt Readings from Last 1 Encounters:  02/17/22 64.4 kg    Ideal Body Weight:  61.8 kg  BMI:  Body mass index is 23.63 kg/m.  Estimated Nutritional Needs:  Kcal:  1600-1800 kcal/d Protein:  80-95g/d Fluid:  1.8-2L/d   02/19/22, RD, LDN Clinical Dietitian RD pager # available in [facility]  After hours/weekend pager # available in [facility]

## 2022-02-19 NOTE — Progress Notes (Signed)
Patient ID: Paul Bradshaw, male   DOB: [birth year], 88 y.o.   MRN: [id number]  Team Conference Report to Patient/Family  Team Conference discussion was reviewed with the patient and caregiver, including goals, any changes in plan of care and target discharge date.  Patient and caregiver express understanding and are in agreement.  The patient has a target discharge date of 03/07/22.  SW spoke with patient son, Casimiro Needle and provided team conference updates. Patient son has been informed of the changed therapy schedule to 15/7. Sw has informed patient son that patient is in need of clothing for therapy, son will do his best to have someone bring up to the hospital. No additional questions or concerns. Andria Rhein 02/19/2022, 1:30 PM

## 2022-02-19 NOTE — Progress Notes (Signed)
Speech Language Pathology Daily Session Note  Patient Details  Name: Paul Bradshaw MRN: [id number] Date of Birth: [birth year]  Today's Date: 02/19/2022 SLP Individual Time: 0733-0830 SLP Individual Time Calculation (min): 57 min  Short Term Goals: Week 1: SLP Short Term Goal 1 (Week 1): Patient will participate in therapeutic PO trials with minimal overt s/sx of aspiration with mod A verbal cues for implementation of swallowing safety SLP Short Term Goal 2 (Week 1): Patient will respond to biographical and environmental yes/no questions with 75% accuracy given moderate A verbal cues SLP Short Term Goal 3 (Week 1): Patient will follow one-step commands with 50% accuracy given max A multimodal cues SLP Short Term Goal 4 (Week 1): Patient will identify field of 2 objects with 25% accuracy given max A multimodal cues SLP Short Term Goal 5 (Week 1): Pt will vocalize during 10% of opportunities during speech/language tasks with max A multimodal cues SLP Short Term Goal 6 (Week 1): Pt will communicate functional needs through multimodal means with max A multimodal cues  Skilled Therapeutic Interventions: Skilled ST treatment focused on dysphagia and language goals. Pt was received alert and semi reclined in bed on arrival. Pt agreeable to ST intervention with head nod. Pt communicated primarily through non-verbal means through responses to basic yes/no questions with head nods. SLP provided education on hospitalization, reasoning for rehab, aphasia, dysphagia, and recent Covid diagnosis. Pt maintained eye contact with SLP during communication exchange. Unable to assess comprehension due to severity of expressive deficits. Pt made vocalizations on occasion with groping behavior, and laughed during appropriate times x2. Vocal quality was reduced and perceived as somewhat congested. Unable to facilitate structured language tasks due to time constraints. Pt responded to yes/no questions pertaining to personal  preferences/wants/wishes during 50% of occasions with mod-to-max A verbal cues and benefited from verbal repetition, additional processing time, and expectant look.   Patient consumed morning meal with moderate feeding assist and min A verbal cues. Pt required support scooping food onto spoon and was then able to bring spoon/cup to mouth. Pt exhibited perseveration by continuing to place empty spoon in mouth. Also question if pt was able to terminate consumption of liquids, as he had almost consumed full honey thick liquid (HTL) container in one sitting if SLP had not intervened. Pt exhibited mild R anterior labial spillage, what appeared to be mildly delayed AP transit, and mild oral residuals post swallows. SLP facilitated alternating between bites/sips every 2-3 bites. Pt exhibited occasional cough following meal (10+ minutes).  SLP facilitated oral care using suction toothbrush with max A for thoroughness. Pt washed face with warm washcloth with min A verbal and gestural cues to initiate. Pt was able to terminate these tasks without difficulty. Pt followed familiar 1-step commands with mod A verbal and gestural cues to initiate. Patient was left in bed with alarm activated and immediate needs within reach at end of session. Continue per current plan of care.       Pain Pain Assessment Pain Scale: PAINAD PAINAD (Pain Assessment in Advanced Dementia) Breathing: normal Negative Vocalization: none Facial Expression: smiling or inexpressive Body Language: relaxed Consolability: no need to console PAINAD Score: 0  Therapy/Group: Individual Therapy  Yashas Camilli T Tarrin Menn 02/19/2022, 8:14 AM

## 2022-02-19 NOTE — Progress Notes (Signed)
Patient up in bed. No S/S of pain or discomfort. Remains on isolation precautions due to Covid +. Encouraged fluids call light with reach.

## 2022-02-19 NOTE — Progress Notes (Signed)
PROGRESS NOTE   Subjective/Complaints:  Covid + after exposure to visitor , no cough during exam, PT notes 2-3  dry coughs during the session , last low grade temp (99.3) was on 8/28  ROS- unable to assess due to aphasia   Objective:   No results found. Recent Labs    02/18/22 0505  WBC 8.0  HGB 12.5*  HCT 38.0*  PLT 220    Recent Labs    02/17/22 1351 02/18/22 0505  NA 143 142  K 4.1 3.2*  CL 109 106  CO2 26 28  GLUCOSE 101* 101*  BUN 32* 26*  CREATININE 0.76 0.75  CALCIUM 8.4* 8.5*     Intake/Output Summary (Last 24 hours) at 02/19/2022 0931 Last data filed at 02/19/2022 0800 Gross per 24 hour  Intake 1320 ml  Output --  Net 1320 ml      Pressure Injury 02/07/22 Perineum Medial Stage 1 -  Intact skin with non-blanchable redness of a localized area usually over a bony prominence. 2 1/2in by 1 inch redness, non blanching (Active)  02/07/22 1327  Location: Perineum  Location Orientation: Medial  Staging: Stage 1 -  Intact skin with non-blanchable redness of a localized area usually over a bony prominence.  Wound Description (Comments): 2 1/2in by 1 inch redness, non blanching  Present on Admission: Yes    Physical Exam: Vital Signs Blood pressure (!) 153/63, pulse 73, temperature 98.3 F (36.8 C), temperature source Oral, resp. rate 16, height _0  (1.651 m), weight 64.4 kg, SpO2 93 %.   General: No acute distress Mood and affect are appropriate Heart: Regular rate and rhythm no rubs murmurs or extra sounds Lungs: Clear to auscultation, breathing unlabored, no rales or wheezes Abdomen: Positive bowel sounds, soft nontender to palpation, nondistended Extremities: No clubbing, cyanosis, or edema Skin: No evidence of breakdown, no evidence of rash Neurologic: Cranial nerves II through XII intact, motor strength is 5/5 in bilateral deltoid, bicep, tricep, grip, hip flexor, knee extensors, ankle  dorsiflexor and plantar flexor Sensory exam unable to assess , aphasia  Musculoskeletal: pain with RIght finger ext , mild dorsal hand edema on RIght    Assessment/Plan: 1. Functional deficits which require 3+ hours per day of interdisciplinary therapy in a comprehensive inpatient rehab setting. Physiatrist is providing close team supervision and 24 hour management of active medical problems listed below. Physiatrist and rehab team continue to assess barriers to discharge/monitor patient progress toward functional and medical goals  Care Tool:  Bathing    Body parts bathed by patient: Chest, Face, Front perineal area, Right arm, Abdomen, Right upper leg, Left upper leg   Body parts bathed by helper: Left arm, Buttocks, Right lower leg, Left lower leg     Bathing assist Assist Level: Moderate Assistance - Patient 50 - 74%     Upper Body Dressing/Undressing Upper body dressing   What is the patient wearing?: Hospital gown only    Upper body assist Assist Level: Maximal Assistance - Patient 25 - 49%    Lower Body Dressing/Undressing Lower body dressing      What is the patient wearing?: Incontinence brief, Pants     Lower  body assist Assist for lower body dressing: Total Assistance - Patient < 25%     Toileting Toileting    Toileting assist Assist for toileting: Total Assistance - Patient < 25%     Transfers Chair/bed transfer  Transfers assist  Chair/bed transfer activity did not occur: Safety/medical concerns  Chair/bed transfer assist level: Minimal Assistance - Patient > 75%     Locomotion Ambulation   Ambulation assist   Ambulation activity did not occur: Safety/medical concerns          Walk 10 feet activity   Assist  Walk 10 feet activity did not occur: Safety/medical concerns        Walk 50 feet activity   Assist Walk 50 feet with 2 turns activity did not occur: Safety/medical concerns         Walk 150 feet activity   Assist  Walk 150 feet activity did not occur: Safety/medical concerns         Walk 10 feet on uneven surface  activity   Assist Walk 10 feet on uneven surfaces activity did not occur: Safety/medical concerns         Wheelchair     Assist Is the patient using a wheelchair?: No (Did not use one PTA) Type of Wheelchair: Manual Wheelchair activity did not occur: Safety/medical concerns         Wheelchair 50 feet with 2 turns activity    Assist    Wheelchair 50 feet with 2 turns activity did not occur: Safety/medical concerns       Wheelchair 150 feet activity     Assist  Wheelchair 150 feet activity did not occur: Safety/medical concerns       Blood pressure (!) 153/63, pulse 73, temperature 98.3 F (36.8 C), temperature source Oral, resp. rate 16, height _0  (1.651 m), weight 64.4 kg, SpO2 93 %.  Medical Problem List and Plan: 1. Functional deficits secondary to left MCA/ICA stroke with dense RUE HP and expressive aphasia             -patient may shower             -ELOS/Goals: 10-14 days, supervision PT, sup/min OT, min to mod assist SLP Team conference today please see physician documentation under team conference tab, met with team  to discuss problems,progress, and goals. Formulized individual treatment plan based on medical history, underlying problem and comorbidities.              -WHO for RUE 2.  Antithrombotics: -DVT/anticoagulation:  Pharmaceutical: Lovenox             -antiplatelet therapy: DAPT X 3 months followed by ASA alone.  3. Pain Management: Tylenol prn.  4. Mood/Behavior/Sleep: LCSW to follow for evaluation and support.              -antipsychotic agents: N/A 5. Neuropsych/cognition: This patient is not fully capable of making decisions on his own behalf. 6. Skin/Wound Care: Routine pressure relief measures.  7. Fluids/Electrolytes/Nutrition: Monitor I/O. Check CMET in am for follow up on AKI/LFTs.             --Assist with/encourage  honey thick fluid intake.  HypoK+ will supplement , not on diuretics but had several stools on 8/27 8. L-MCA infarct with hemorrhagic conversion: DAPT X 90 day followed by ASA alone 9. HTN: Monitor BP TID. BP remains labile. Resume Proscar.  --Continue Metoprolol--was titrated upwards 08/27 for better control.  Vitals:   02/18/22 2016 02/19/22 2409  BP: 136/69 (!) 153/63  Pulse: 80 73  Resp: 16 16  Temp: 98.4 F (36.9 C) 98.3 F (36.8 C)  SpO2: 97% 93%    10. Dysphagia: Continue D1, honey thick liquids. Needs assistance for feeding and supervision for safety.  --hypernatremia/AKI likely due to dysphagia diet 11. BPH: Monitor for any voiding difficulties. Will order PVR checks as off his meds.  --Proscar was not resumed. Flomax was d/c on 08/20.  12. Pre-renal azotemia: Offer fluids between meals.  --Indicated thirst -->drank honey liquids without negative expression.  -supplement with IVF if needed 13. Covid + asymptomatic, monitor for cough fever, sore throat, no antiviral unless symptomatic, maintain isolation in room 5-7 d per ID, N95 masking in room  --continue to monitor for any signs of infection/aspiration.     Latest Ref Rng & Units 02/18/2022    5:05 AM 02/16/2022    5:27 AM 02/13/2022    6:11 AM  CBC  WBC 4.0 - 10.5 K/uL 8.0  10.2  10.4   Hemoglobin 13.0 - 17.0 g/dL 12.5  12.9  12.4   Hematocrit 39.0 - 52.0 % 38.0  40.8  38.9   Platelets 150 - 400 K/uL 220  276  248        LOS: 2 days A FACE TO FACE EVALUATION WAS PERFORMED  Charlett Blake 02/19/2022, 9:31 AM

## 2022-02-19 NOTE — IPOC Note (Signed)
Overall Plan of Care [facility]) Patient Details Name: Paul Bradshaw MRN: [id number] DOB: 12/04/1925  Admitting Diagnosis: Acute ischemic left middle cerebral artery (MCA) stroke [facility])  Hospital Problems: Principal Problem:   Acute ischemic left middle cerebral artery (MCA) stroke (HCC)     Functional Problem List: Nursing Bladder, Bowel, Endurance, Pain, Safety, Medication Management  PT Balance, Endurance, Motor, Nutrition, Perception, Safety, Sensory, Skin Integrity  OT Balance, Cognition, Endurance, Motor, Perception, Safety, Sensory, Vision  SLP Linguistic, Cognition, Nutrition  TR         Basic ADL's: OT Eating, Grooming, Bathing, Dressing, Toileting     Advanced  ADL's: OT       Transfers: PT Bed Mobility, Bed to Chair, Car, Occupational psychologist, Research scientist (life sciences): PT Ambulation, Psychologist, prison and probation services, Stairs     Additional Impairments: OT Fuctional Use of Upper Extremity  SLP Swallowing, Communication comprehension, expression    TR      Anticipated Outcomes Item Anticipated Outcome  Self Feeding supervision  Swallowing  min A   Basic self-care  min A  Toileting  Min A   Bathroom Transfers Min A  Bowel/Bladder  manage bowel w mod I and bladder with mod I assist  Transfers  CGA  Locomotion  MinA  Communication  mod A  Cognition     Pain  < 4 with prns  Safety/Judgment  manage w cues   Therapy Plan: PT Intensity: Minimum of 1-2 x/day ,45 to 90 minutes PT Frequency: 5 out of 7 days PT Duration Estimated Length of Stay: 2-3 weeks OT Intensity: Minimum of 1-2 x/day, 45 to 90 minutes OT Frequency: 5 out of 7 days OT Duration/Estimated Length of Stay: 21-22 days SLP Intensity: Minumum of 1-2 x/day, 30 to 90 minutes SLP Frequency: 3 to 5 out of 7 days SLP Duration/Estimated Length of Stay: 3 weeks   Team Interventions: Nursing Interventions Bladder Management, Disease Management/Prevention, Medication Management, Discharge Planning, Pain  Management, Bowel Management, Patient/Family Education, Dysphagia/Aspiration Precaution Training  PT interventions Ambulation/gait training, Discharge planning, Psychosocial support, Functional mobility training, Therapeutic Activities, Visual/perceptual remediation/compensation, Wheelchair propulsion/positioning, Therapeutic Exercise, Skin care/wound management, Neuromuscular re-education, Disease management/prevention, Warden/ranger, Cognitive remediation/compensation, Pain management, DME/adaptive equipment instruction, Splinting/orthotics, UE/LE Strength taining/ROM, UE/LE Coordination activities, Stair training, Patient/family education, Functional electrical stimulation, Community reintegration  OT Interventions Warden/ranger, Cognitive remediation/compensation, Discharge planning, DME/adaptive equipment instruction, Neuromuscular re-education, Functional mobility training, Patient/family education, Psychosocial support, Self Care/advanced ADL retraining, Therapeutic Activities, Therapeutic Exercise, UE/LE Strength taining/ROM, UE/LE Coordination activities, Visual/perceptual remediation/compensation  SLP Interventions Cognitive remediation/compensation, Cueing hierarchy, Dysphagia/aspiration precaution training, Functional tasks, Internal/external aids, Multimodal communication approach, Patient/family education, Speech/Language facilitation, Therapeutic Activities  TR Interventions    SW/CM Interventions Discharge Planning, Psychosocial Support, Disease Management/Prevention, Patient/Family Education   Barriers to Discharge MD  Medical stability  Nursing Decreased caregiver support, Home environment access/layout 1 level 6 ste w wife (dementia) PTA; plan to go home w son 1 level apt  PT Inaccessible home environment, Decreased caregiver support, Home environment access/layout, Lack of/limited family support, Insurance for SNF coverage    OT      SLP Nutrition  means    SW       Team Discharge Planning: Destination: PT-Home ,OT- Home , SLP-Home Projected Follow-up: PT-Home health PT, 24 hour supervision/assistance, OT-  Home health OT, 24 hour supervision/assistance, SLP-24 hour supervision/assistance, Home Health SLP Projected Equipment Needs: PT-To be determined, OT- 3 in 1 bedside comode, Tub/shower bench, SLP-To be determined Equipment Details:  PT- , OT-  Patient/family involved in discharge planning: PT- Patient,  OT-Patient unable/family or caregiver not available, SLP-Patient unable/family or caregive not available  MD ELOS: 10-14d Medical Rehab Prognosis:  Good Assessment: The patient has been admitted for CIR therapies with the diagnosis of Left MCA infarct. The team will be addressing functional mobility, strength, stamina, balance, safety, adaptive techniques and equipment, self-care, bowel and bladder mgt, patient and caregiver education, monitor for Covid symptoms is PCR +. Goals have been set at [facility]. Anticipated discharge destination is home with family .        See Team Conference Notes for weekly updates to the plan of care

## 2022-02-19 NOTE — Progress Notes (Signed)
Occupational Therapy Session Note  Patient Details  Name: Paul Bradshaw MRN: [id number] Date of Birth: February 26, 1926  Today's Date: 02/19/2022 OT Individual Time: 1035-1130 OT Individual Time Calculation (min): 55 min    Short Term Goals: Week 1:  OT Short Term Goal 1 (Week 1): Pt will be able to sit to stand to prep for LB dressing/ toileting with min A. OT Short Term Goal 2 (Week 1): Pt will be able to stand pivot to [facility] with mod A. OT Short Term Goal 3 (Week 1): Pt will be able to stand upright with min A to increase safety with standing during clothing management. OT Short Term Goal 4 (Week 1): Pt will don shirt with mod A. OT Short Term Goal 5 (Week 1): Pt will demonstrate awareness of RUE by moving R arm into position with LUE when cued.  Skilled Therapeutic Interventions/Progress Updates:    Pt received in recliner and agreeable to working on b/d.  Pt was able to participate more actively today demonstrating increased attention and initiation.  Pt completed bathing with mod A, crossing his feet over his knees to beable to reach his feet.  He needed min A to don pants over feet, mod A with socks. Pt able to stand with min A and pull pants over hips with mod A. Used hand over hand guiding to integrate R arm into ADL tasks as a stabilizer.  He did demonstrate some active sh elevation when trying to move his arm.  He then completed a stand pivot to bed with min A.  Cues to stand upright but pt continues with flexed posture.  Pt worked on standing at EOB for several minutes shifting wt side to side with LUE on rail and min A for support.   Sat EOB for RUE NMR with towel table top slides with hand over hand guiding. Pt was able to eventually demonstrate active elb flexion and horiz add with arm supported on table.  Hand over hand guiding with guiding hand throughout session to use as a stabilizing assist with holding objects. No active finger movement observed.   Pt moved to supine to work on  trunk extension on fully flat bed with only 1 pillow. Initially pt seemed uncomfortable wanting to keep head and trunk flexed. Worked on gradual extension and pt then able to rest head fully on pillow and extend spine.   For tricep facilitation, did hand drop activity. Pt did have some reflexive activity in triceps with arm catching before his hand fully dropped.      Pt resting in bed with arm positioned with pillows and all needs met, alarm set.   Therapy Documentation Precautions:  Precautions Precautions: Fall Precaution Comments: R hemipareisis, expressive>receptive aphasia Restrictions Weight Bearing Restrictions: No  Pain: Pain Assessment Pain Scale: PAINAD Pain Score: 0-No pain ADL: ADL Eating: Maximal assistance Grooming: Moderate assistance Upper Body Bathing: Moderate assistance Where Assessed-Upper Body Bathing: Other (Comment) (BSC) Lower Body Bathing: Moderate assistance Where Assessed-Lower Body Bathing: Other (Comment) (BSC) Upper Body Dressing: Maximal assistance Lower Body Dressing: Maximal assistance Toileting: Dependent Where Assessed-Toileting: Bedside Commode Toilet Transfer: Maximal assistance Toilet Transfer Method: Stand pivot Toilet Transfer Equipment: Bedside commode  Therapy/Group: Individual Therapy  [place] 02/19/2022, 11:57 AM

## 2022-02-19 NOTE — Progress Notes (Signed)
Physical Therapy Session Note  Patient Details  Name: Paul Bradshaw MRN: [id number] Date of Birth: [birth year]  Today's Date: 02/19/2022 PT Individual Time:  [phone]   54 min   Short Term Goals: Week 1:  PT Short Term Goal 1 (Week 1): Pt will perform bed mobility with overall CGA and using no bed features. PT Short Term Goal 2 (Week 1): Pt will perform all functional transfers with light MinA/ CGA and LRAD. PT Short Term Goal 3 (Week 1): Pt will ambulate at least 40 ft using LRAD with CGA/ MinA. PT Short Term Goal 4 (Week 1): Pt will initiate stair training. PT Short Term Goal 5 (Week 1): Pt will perform Berg Balance test.  Skilled Therapeutic Interventions/Progress Updates:   Pt received supine in bed and agreeable to PT. Supine>sit transfer with mod assist and cues for log roll technique with poor adherence to instruction utilizing partial long sitting technique. Sitting balance EOB with supervision assist from PT. Sit<>stand with min-mod assist for safety and BUE support on RW. Max assist for positioning ot RUE on the RUE. Pt able to take 2 steps and then noted to have incontinent bowel movement. Stand pivot transfer to [facility] to complete BM. Perineal Hygiene standing on BSC with total A by PT while pt standing with RW. With min assist for safety. Stand pivot transfers to bed and the recliner with no AD and mod assist from PT with LUE supported. MD present to assess pt while performing sitting balance EOB with supervision assist. Pt left sitting in recliner with call bell in reach.         Therapy Documentation Precautions:  Precautions Precautions: Fall Precaution Comments: R hemipareisis, expressive>receptive aphasia Restrictions Weight Bearing Restrictions: No  Vital Signs: Therapy Vitals Temp: 98.3 F (36.8 C) Temp Source: Oral Pulse Rate: 73 Resp: 16 BP: (!) 153/63 Oxygen Therapy SpO2: 93 % O2 Device: Room Air Pain: Pain Assessment Pain Scale: PAINAD PAINAD (Pain  Assessment in Advanced Dementia) Breathing: normal Negative Vocalization: none Facial Expression: smiling or inexpressive Body Language: relaxed Consolability: no need to console PAINAD Score: 0    Therapy/Group: Individual Therapy  [place] 02/19/2022, 9:11 AM

## 2022-02-19 NOTE — Plan of Care (Signed)
  Problem: RH Balance Goal: LTG Patient will maintain dynamic standing balance (PT) Description: LTG:  Patient will maintain dynamic standing balance with assistance during mobility activities (PT) Flowsheets (Taken 02/19/2022 0648) LTG: Pt will maintain dynamic standing balance during mobility activities with:: Supervision/Verbal cueing   Problem: Sit to Stand Goal: LTG:  Patient will perform sit to stand with assistance level (PT) Description: LTG:  Patient will perform sit to stand with assistance level (PT) Flowsheets (Taken 02/19/2022 0648) LTG: PT will perform sit to stand in preparation for functional mobility with assistance level: Supervision/Verbal cueing   Problem: RH Bed Mobility Goal: LTG Patient will perform bed mobility with assist (PT) Description: LTG: Patient will perform bed mobility with assistance, with/without cues (PT). Flowsheets (Taken 02/19/2022 [phone]) LTG: Pt will perform bed mobility with assistance level of: Independent   Problem: RH Bed to Chair Transfers Goal: LTG Patient will perform bed/chair transfers w/assist (PT) Description: LTG: Patient will perform bed to chair transfers with assistance (PT). Flowsheets (Taken 02/19/2022 [phone]) LTG: Pt will perform Bed to Chair Transfers with assistance level: Supervision/Verbal cueing   Problem: RH Car Transfers Goal: LTG Patient will perform car transfers with assist (PT) Description: LTG: Patient will perform car transfers with assistance (PT). Flowsheets (Taken 02/19/2022 [phone]) LTG: Pt will perform car transfers with assist:: Contact Guard/Touching assist   Problem: RH Furniture Transfers Goal: LTG Patient will perform furniture transfers w/assist (OT/PT) Description: LTG: Patient will perform furniture transfers  with assistance (OT/PT). Flowsheets (Taken 02/19/2022 [phone]) LTG: Pt will perform furniture transfers with assist:: Supervision/Verbal cueing   Problem: RH Ambulation Goal: LTG Patient will ambulate in  controlled environment (PT) Description: LTG: Patient will ambulate in a controlled environment, # of feet with assistance (PT). Flowsheets (Taken 02/19/2022 [phone]) LTG: Pt will ambulate in controlled environ  assist needed:: Contact Guard/Touching assist LTG: Ambulation distance in controlled environment: 150 ft using LRAD Goal: LTG Patient will ambulate in home environment (PT) Description: LTG: Patient will ambulate in home environment, # of feet with assistance (PT). Flowsheets (Taken 02/19/2022 [phone]) LTG: Pt will ambulate in home environ  assist needed:: Contact Guard/Touching assist LTG: Ambulation distance in home environment: up to 50 ft using LRAD   Problem: RH Stairs Goal: LTG Patient will ambulate up and down stairs w/assist (PT) Description: LTG: Patient will ambulate up and down # of stairs with assistance (PT) Flowsheets (Taken 02/19/2022 0648) LTG: Pt will ambulate up/down stairs assist needed:: Minimal Assistance - Patient > 75% LTG: Pt will  ambulate up and down number of stairs: 5-6 steps using HR setup as per home environment

## 2022-02-19 NOTE — Patient Care Conference (Signed)
Inpatient RehabilitationTeam Conference and Plan of Care Update Date: 02/19/2022   Time: 10:04 AM    Patient Name: Paul Bradshaw      Medical Record Number: [id number]  Date of Birth: 11-04-1925 Sex: Male         Room/Bed: 4M01C/4M01C-01 Payor Info: Payor: Multimedia programmer / Plan: [facility] MEDICARE / Product Type: *No Product type* /    Admit Date/Time:  02/17/2022  1:00 PM  Primary Diagnosis:  Acute ischemic left middle cerebral artery (MCA) stroke [facility])  Hospital Problems: Principal Problem:   Acute ischemic left middle cerebral artery (MCA) stroke [facility])    Expected Discharge Date: Expected Discharge Date: 03/07/22  Team Members Present: Physician leading conference: Dr. Claudette Laws Social Worker Present: Lavera Guise, BSW Nurse Present: Chana Bode, RN PT Present: Grier Rocher, PT OT Present: Primitivo Gauze, OT SLP Present: Eilene Ghazi, SLP PPS Coordinator present : Fae Pippin, SLP     Current Status/Progress Goal Weekly Team Focus  Bowel/Bladder   Patient incontinent of b/b. Last BM 02/19/22  Regain continence of b/b  Assit with time toileting qshift and prn   Swallow/Nutrition/ Hydration   dys 1 diet, honey thick liquids - min A  sup A  diet tolerance, therapeutic PO trials   ADL's   max bed mobility, stand pivot transfers; mod sit >< stand; max bathing; total dressing and toileting; dense RUE hemiparesis  min A overall  ADL training, RUE NMR, R visual scanning, postural control, pt/fam education   Mobility   COVID (+), fatigued on eval; Bed mobility = MinA, Transfers = Min/ ModA, no ambulation on eval d/t fatigue and lethargy  overall CGA/ MinA  NMR for R hemibody, motor planning, proprioception, balance, and understanding; improving LOA with transfers, gait training, stair training   Communication   total A verbal communication, mod-to-max A comprehension  min-to-mod A basic comprehension, mod A expression through multimodal means  multimodal  communication, yes/no responses, following 1-step directions, object ID, vocalizations   Safety/Cognition/ Behavioral Observations  difficult to assess  NA at this time  plan to focus on langage and swallowing goals at this time   Pain   No c/o pain  Remain Pain <3/10  Assess pain qshift   Skin   Skin intact, stage 1 on sacrum, covered with mepilex sacrum foam  Skin to reamin intact, no new skin breakdown  Assess skin qshift     Discharge Planning:  Patient anticiapting discharging home with his son, Casimiro Needle and friend, [name]. 1st level apartment. Patients home has 1 level, ramped entrance. 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   Team Discussion: Patient with left MCA CVA and COVID +.  Very lethargic. Progress limited by receptive aphasha, right side inattention and perseveration. Patient on target to meet rehab goals: Currently needs mod assist for sit- stand, toileting due to forward trunk lean. Stand pivots with min - mod assist. Patient was coughing after meals; on D1 honey thick diet. Needs total assist for verbal communication and using multi modal communication tools, max assist for comprehension. Goals for discharge set for mod - max for SLP and min for OT with supervision /CGA for gait.  *See Care Plan and progress notes for long and short-term goals.   Revisions to Treatment Plan:  Palliative Care following Changed to 15/7 with COVID confirmation   Teaching Needs: Safety, medications, dietary modification recommendations, transfers, toileting, etc  Current Barriers to Discharge: Decreased caregiver support and Home enviroment access/layout  Possible  Resolutions to Barriers: Family education     Medical Summary Current Status: left MCA infarct, asymptomatic Covid +  Barriers to Discharge: Medical stability;Other (comments)  Barriers to Discharge Comments: airborne precautions Possible Resolutions to [employer] Focus: cont  rehab program, monitor for Covid symptoms   Continued Need for Acute Rehabilitation Level of Care: The patient requires daily medical management by a physician with specialized training in physical medicine and rehabilitation for the following reasons: Direction of a multidisciplinary physical rehabilitation program to maximize functional independence : Yes Medical management of patient stability for increased activity during participation in an intensive rehabilitation regime.: Yes Analysis of laboratory values and/or radiology reports with any subsequent need for medication adjustment and/or medical intervention. : Yes   I attest that I was present, lead the team conference, and concur with the assessment and plan of the team.   Chana Bode B 02/19/2022, 2:10 PM

## 2022-02-20 DIAGNOSIS — I63512 Cerebral infarction due to unspecified occlusion or stenosis of left middle cerebral artery: Secondary | ICD-10-CM | POA: Diagnosis not present

## 2022-02-20 NOTE — Progress Notes (Signed)
PROGRESS NOTE   Subjective/Complaints: Remains aphasic  Covid + after exposure to visitor , no cough during exam, PT notes 1  dry coughs during the session , last low grade temp (99.3) was on 8/28 WBC normal   ROS- unable to assess due to aphasia   Objective:   No results found. Recent Labs    02/18/22 0505  WBC 8.0  HGB 12.5*  HCT 38.0*  PLT 220    Recent Labs    02/17/22 1351 02/18/22 0505  NA 143 142  K 4.1 3.2*  CL 109 106  CO2 26 28  GLUCOSE 101* 101*  BUN 32* 26*  CREATININE 0.76 0.75  CALCIUM 8.4* 8.5*     Intake/Output Summary (Last 24 hours) at 02/20/2022 0919 Last data filed at 02/20/2022 2505 Gross per 24 hour  Intake 1194 ml  Output 1 ml  Net 1193 ml      Pressure Injury 02/07/22 Perineum Medial Stage 1 -  Intact skin with non-blanchable redness of a localized area usually over a bony prominence. 2 1/2in by 1 inch redness, non blanching (Active)  02/07/22 1327  Location: Perineum  Location Orientation: Medial  Staging: Stage 1 -  Intact skin with non-blanchable redness of a localized area usually over a bony prominence.  Wound Description (Comments): 2 1/2in by 1 inch redness, non blanching  Present on Admission: Yes    Physical Exam: Vital Signs Blood pressure (!) 152/57, pulse 71, temperature 98.2 F (36.8 C), temperature source Oral, resp. rate 16, height 5\' 5"  (1.651 m), weight 64.4 kg, SpO2 98 %.   General: No acute distress Mood and affect are appropriate Heart: Regular rate and rhythm no rubs murmurs or extra sounds Lungs: Clear to auscultation, breathing unlabored, no rales or wheezes Abdomen: Positive bowel sounds, soft nontender to palpation, nondistended Extremities: No clubbing, cyanosis, or edema Skin: No evidence of breakdown, no evidence of rash Neurologic: Cranial nerves II through XII intact, motor strength is 5/5 in left and 0/5 right deltoid, bicep, tricep, grip,  4/5 Bilateral hip flexor, knee extensors, ankle dorsiflexor and plantar flexor Sensory exam unable to assess , aphasia but does not wince to pinch on Right little finger   Musculoskeletal: pain with RIght finger ext , mild dorsal hand edema on RIght    Assessment/Plan: 1. Functional deficits which require 3+ hours per day of interdisciplinary therapy in a comprehensive inpatient rehab setting. Physiatrist is providing close team supervision and 24 hour management of active medical problems listed below. Physiatrist and rehab team continue to assess barriers to discharge/monitor patient progress toward functional and medical goals  Care Tool:  Bathing    Body parts bathed by patient: Chest, Face, Front perineal area, Right arm, Abdomen, Right upper leg, Left upper leg   Body parts bathed by helper: Left arm, Buttocks, Right lower leg, Left lower leg     Bathing assist Assist Level: Moderate Assistance - Patient 50 - 74%     Upper Body Dressing/Undressing Upper body dressing   What is the patient wearing?: Hospital gown only    Upper body assist Assist Level: Maximal Assistance - Patient 25 - 49%    Lower Body  Dressing/Undressing Lower body dressing      What is the patient wearing?: Incontinence brief, Pants     Lower body assist Assist for lower body dressing: Total Assistance - Patient < 25%     Toileting Toileting    Toileting assist Assist for toileting: Total Assistance - Patient < 25%     Transfers Chair/bed transfer  Transfers assist  Chair/bed transfer activity did not occur: Safety/medical concerns  Chair/bed transfer assist level: Minimal Assistance - Patient > 75%     Locomotion Ambulation   Ambulation assist   Ambulation activity did not occur: Safety/medical concerns          Walk 10 feet activity   Assist  Walk 10 feet activity did not occur: Safety/medical concerns        Walk 50 feet activity   Assist Walk 50 feet with 2  turns activity did not occur: Safety/medical concerns         Walk 150 feet activity   Assist Walk 150 feet activity did not occur: Safety/medical concerns         Walk 10 feet on uneven surface  activity   Assist Walk 10 feet on uneven surfaces activity did not occur: Safety/medical concerns         Wheelchair     Assist Is the patient using a wheelchair?: No (Did not use one PTA) Type of Wheelchair: Manual Wheelchair activity did not occur: Safety/medical concerns         Wheelchair 50 feet with 2 turns activity    Assist    Wheelchair 50 feet with 2 turns activity did not occur: Safety/medical concerns       Wheelchair 150 feet activity     Assist  Wheelchair 150 feet activity did not occur: Safety/medical concerns       Blood pressure (!) 152/57, pulse 71, temperature 98.2 F (36.8 C), temperature source Oral, resp. rate 16, height 5\' 5"  (1.651 m), weight 64.4 kg, SpO2 98 %.  Medical Problem List and Plan: 1. 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  Antithrombotics: -DVT/anticoagulation:  Pharmaceutical: Lovenox             -antiplatelet therapy: DAPT X 3 months followed by ASA alone.  3. Pain Management: Tylenol prn.  4. Mood/Behavior/Sleep: LCSW to follow for evaluation and support.              -antipsychotic agents: N/A 5. Neuropsych/cognition: This patient is not fully capable of making decisions on his own behalf. 6. Skin/Wound Care: Routine pressure relief measures.  7. Fluids/Electrolytes/Nutrition: Monitor I/O. Check CMET in am for follow up on AKI/LFTs.         HypoK+ supplement and recheck K+ in am  HypoK+ will supplement , not on diuretics but had several stools on 8/27 8. L-MCA infarct with hemorrhagic conversion: DAPT X 90 day followed by ASA alone 9. HTN: Monitor BP TID. BP  remains labile. Resume Proscar.  --Continue Metoprolol--was titrated upwards 08/27 for better control.  Vitals:   02/19/22 2037 02/20/22 0421  BP: (!) 126/57 (!) 152/57  Pulse: 81 71  Resp: 17 16  Temp: 98.4 F (36.9 C) 98.2 F (36.8 C)  SpO2: 95% 98%    10. Dysphagia: Continue D1, honey thick liquids. Needs assistance for feeding and supervision for safety.  --hypernatremia/AKI likely due to dysphagia diet 11. BPH: Monitor for any voiding difficulties. Will order PVR checks as off his meds.  --Proscar was not resumed. Flomax was d/c on 08/20.  12. Pre-renal azotemia: Offer fluids between meals.  --Indicated thirst -->drank honey liquids without negative expression.  -supplement with IVF if needed 13. Covid + asymptomatic,WBC nl.  monitor for cough fever, sore throat, no antiviral unless symptomatic, maintain isolation in room 5-7 d per ID, N95 masking in room  --continue to monitor for any signs of infection/aspiration.     Latest Ref Rng & Units 02/18/2022    5:05 AM 02/16/2022    5:27 AM 02/13/2022    6:11 AM  CBC  WBC 4.0 - 10.5 K/uL 8.0  10.2  10.4   Hemoglobin 13.0 - 17.0 g/dL 76.1  60.7  37.1   Hematocrit 39.0 - 52.0 % 38.0  40.8  38.9   Platelets 150 - 400 K/uL 220  276  248        LOS: 3 days A FACE TO FACE EVALUATION WAS PERFORMED  Erick Colace 02/20/2022, 9:19 AM

## 2022-02-20 NOTE — Progress Notes (Signed)
Physical Therapy Session Note  Patient Details  Name: Paul Bradshaw MRN: [id number] Date of Birth: 15-May-1926  Today's Date: 02/20/2022 PT Individual Time: 0850-0930 and 1420-1500 PT Individual Time Calculation (min): 40 min and 40 min   Short Term Goals: Week 1:  PT Short Term Goal 1 (Week 1): Pt will perform bed mobility with overall CGA and using no bed features. PT Short Term Goal 2 (Week 1): Pt will perform all functional transfers with light MinA/ CGA and LRAD. PT Short Term Goal 3 (Week 1): Pt will ambulate at least 40 ft using LRAD with CGA/ MinA. PT Short Term Goal 4 (Week 1): Pt will initiate stair training. PT Short Term Goal 5 (Week 1): Pt will perform Berg Balance test. Week 2:    Week 3:     Skilled Therapeutic Interventions/Progress Updates:  Session 1 Pt received supine in bed and agreeable to PT. Supine>sit transfer with mod assist and cues for sequencing and ATTENTION TO THE RUE.   Ambulatory transfer to [facility] with HHA onteh LUE with max assist from PT due to poor weight shift and difficulty maintaining erect posture.    Stand pivot transfer to toilet and back to [facility] with mod assist from PT UE supported on Sit<>stand for PT to perform pericare with mod-max assist from PT for posture. Stand pivot transfer to bed with mod assist and UE supported on bed rail. Sitting balance EOB with supervision assist x 2 minutes. Sit>supine completed with mod assist from PT for RUE/RLE,and left supine in bed with call bell in reach and all needs met.    Session 2  Pt received sitting in recliner and agreeable to PT. Pt performed sit<>stand transfer to improve positioning in recliner to improve safety. Ambulatory transfer to [facility] in bathroom with RW and mod-max assist from PT with Rhand splint. Sitting balance on BSC for large BM. Able to void continently on toilet.  Pericare completed by PT with sit<>stand with RW and hand splint with mod assist and max cues for posture. Clothing management  performed by PT  Pt returned to room and performed ambulatory  transfer to bed with RW and mod-max assist as lsited above. Sit>supine completed with mod assist on RLE. Scooting to [facility] with max assist from PT in supine. Pt left supine in bed with call bell in reach and all needs met.        Therapy Documentation Precautions:  Precautions Precautions: Fall Precaution Comments: R hemipareisis, expressive>receptive aphasia Restrictions Weight Bearing Restrictions: No  Pain: Pain Assessment Pain Scale: 0-10 Pain Score: 0-No pain Faces Pain Scale: No hurt  Therapy/Group: Individual Therapy  Lorie Phenix 02/20/2022, 11:24 AM

## 2022-02-20 NOTE — Progress Notes (Signed)
Occupational Therapy Session Note  Patient Details  Name: DELSHAWN STECH MRN: [id number] Date of Birth: 03/25/1926  Today's Date: 02/20/2022 OT Individual Time: 1135-1205 OT Individual Time Calculation (min): 30 min    Short Term Goals: Week 1:  OT Short Term Goal 1 (Week 1): Pt will be able to sit to stand to prep for LB dressing/ toileting with min A. OT Short Term Goal 2 (Week 1): Pt will be able to stand pivot to [id number] with mod A. OT Short Term Goal 3 (Week 1): Pt will be able to stand upright with min A to increase safety with standing during clothing management. OT Short Term Goal 4 (Week 1): Pt will don shirt with mod A. OT Short Term Goal 5 (Week 1): Pt will demonstrate awareness of RUE by moving R arm into position with LUE when cued.  Skilled Therapeutic Interventions/Progress Updates:    Pt received in bed and agreeable to therapy. Sat to EOB with mod A and cues demonstrating improved use of trunk.  Pt needed a consistent mod A with all stands from EOB and from [facility] and was consistently max with stand pivot to [facility] and to recliner due to increased trunk flexion.    On BSC, pt has a wet brief from urine incontinence but had a small void of BM in BSC. (Documented in flow sheets)  During UB self care he did demonstrate some active scapular retraction when cued to pull his arm back to remove arm from sleeve.   Pt seemed more lethargic today vs yesterday and needed more cuing to attend to R side.  Pt donned new clothing and was resting in recliner at end of session with all needs met. Belt alarm on.    Therapy Documentation Precautions:  Precautions Precautions: Fall Precaution Comments: R hemipareisis, expressive>receptive aphasia Restrictions Weight Bearing Restrictions: No Pain: Pain Assessment Pain Scale: 0-10 Pain Score: 0-No pain Faces Pain Scale: No hurt ADL: ADL Eating: Maximal assistance Grooming: Moderate assistance Upper Body Bathing: Moderate assistance Where  Assessed-Upper Body Bathing: Other (Comment) (BSC) Lower Body Bathing: Moderate assistance Where Assessed-Lower Body Bathing: Other (Comment) (BSC) Upper Body Dressing: Maximal assistance Lower Body Dressing: Maximal assistance Toileting: Dependent Where Assessed-Toileting: Bedside Commode Toilet Transfer: Maximal assistance Toilet Transfer Method: Stand pivot Toilet Transfer Equipment: Bedside commode   Therapy/Group: Individual Therapy  [place] 02/20/2022, 9:35 AM

## 2022-02-20 NOTE — Progress Notes (Signed)
Patients granddaughter Kriste Basque  called for updates on her grandfather. Writer suggested that family come to a census who is calling for updates since several family members call 4 to 5 times a shift. Kriste Basque states that she does not talk to her father Casimiro Needle. Writer suggested to talk to SW to help come up with one family member to obtain information daily and notify additional family members.

## 2022-02-20 NOTE — Progress Notes (Signed)
Patient ID: Paul Bradshaw, male   DOB: 1926-04-17, 88 y.o.   MRN: [id number]  SW received call from patient granddaughter, Paul Bradshaw. Paul Bradshaw is concerned because she has been informed that there needs to be a designated contract person to call the nursing station for updates. Sw confirmed this is correct and currently the point of contact is patient son, Paul Bradshaw.  Granddaughter reports that her and patient son, Paul Needle do not communicate and he does not communicate with the rest of the family members. Sw informed granddaughter to discuss with patient son and family that Paul Bradshaw and Paul Bradshaw should be the only two family members calling for updates. No additional questions or concerns.

## 2022-02-20 NOTE — Progress Notes (Signed)
Speech Language Pathology Daily Session Note  Patient Details  Name: Paul Bradshaw MRN: [id number] Date of Birth: 09-24-1925  Today's Date: 02/20/2022 SLP Individual Time: 1304-1400 SLP Individual Time Calculation (min): 56 min  Short Term Goals: Week 1: SLP Short Term Goal 1 (Week 1): Patient will participate in therapeutic PO trials with minimal overt s/sx of aspiration with mod A verbal cues for implementation of swallowing safety SLP Short Term Goal 2 (Week 1): Patient will respond to biographical and environmental yes/no questions with 75% accuracy given moderate A verbal cues SLP Short Term Goal 3 (Week 1): Patient will follow one-step commands with 50% accuracy given max A multimodal cues SLP Short Term Goal 4 (Week 1): Patient will identify field of 2 objects with 25% accuracy given max A multimodal cues SLP Short Term Goal 5 (Week 1): Pt will vocalize during 10% of opportunities during speech/language tasks with max A multimodal cues SLP Short Term Goal 6 (Week 1): Pt will communicate functional needs through multimodal means with max A multimodal cues  Skilled Therapeutic Interventions: Skilled ST treatment focused on language goals. Pt was accompanied by NT on arrival for assist with noon meal. NT reported tolerance of dys 1 textures with honey thick liquids without any indication of airway invasion.   SLP facilitated vocalization tasks by providing max A multimodal cues for production of "happy birthday" song. Pt was able to intone and approximate ~10% of words during 2nd attempt. Pt was unable to vocalize during other attempts including sustained "ah", counting 1-5, phoneme repetition, or word level repetition with intoning.   SLP facilitated receptive language skills by assessing accuracy of yes/no responses. Pt responded to biographical yes/no questions by nodding his head with 66% accuracy given moderate A verbal cues, 28% accuracy and max A multimodal cues for orientation  questions (oriented to state only), and 0% accuracy and max A multimodal cues with environmental questions.   SLP facilitated multimodal communication by providing total A to gesture thumbs up/thumbs down for responding to yes/no questions, or to indicate good/bad. Pt exhibited difficulty motor planning gestures and required total A to form gesture with left hand. ? apraxia. Pt exhibited no carry over of gestures during functional language tasks nor in natural contexts.   With writing tasks, pt was able to initiate the letter "B" for his name, however persevered and continued to write this letter an additional 6 times. Pt was not stimulable to copy and recall therapy (CART) components this date.   With reading tasks, pt pointed to field of 2 words accurately during 1 of 3 occasions with max A multimodal cues. Pt appeared to have difficulty understanding task instructions to "point to" or "show me" correct response and required multimodal cueing to increase comprehension of expectations which was minimally effective.   Patient was left in recliner with alarm activated and immediate needs within reach at end of session. Continue per current plan of care.      Pain Pain Assessment Pain Scale: 0-10 Pain Score: 0-No pain Faces Pain Scale: No hurt  Therapy/Group: Individual Therapy  Jamaria Amborn T Jaeveon [place] 02/20/2022, 1:16 PM

## 2022-02-21 DIAGNOSIS — E43 Unspecified severe protein-calorie malnutrition: Secondary | ICD-10-CM | POA: Insufficient documentation

## 2022-02-21 LAB — POTASSIUM: Potassium: 4.2 mmol/L (ref 3.5–5.1)

## 2022-02-21 MED ORDER — MELATONIN 3 MG PO TABS
3.0000 mg | ORAL_TABLET | Freq: Every evening | ORAL | Status: DC | PRN
  Administered 2022-03-09: 3 mg via ORAL
  Filled 2022-02-21: qty 1

## 2022-02-21 NOTE — Progress Notes (Signed)
PROGRESS NOTE   Subjective/Complaints: Pt seen at bedside. Remains aphasic   Afebrile overnight, vitally stable, sating 100% on RA. Therapies noting slight nonproductive cough and fatigue but otherwise at baseline.   ROS- unable to assess due to aphasia   Objective:   No results found. No results for input(s): "WBC", "HGB", "HCT", "PLT" in the last 72 hours.  Recent Labs    02/21/22 0754  K 4.2     Intake/Output Summary (Last 24 hours) at 02/21/2022 1203 Last data filed at 02/21/2022 0753 Gross per 24 hour  Intake 530 ml  Output --  Net 530 ml      Pressure Injury 02/07/22 Perineum Medial Stage 1 -  Intact skin with non-blanchable redness of a localized area usually over a bony prominence. 2 1/2in by 1 inch redness, non blanching (Active)  02/07/22 1327  Location: Perineum  Location Orientation: Medial  Staging: Stage 1 -  Intact skin with non-blanchable redness of a localized area usually over a bony prominence.  Wound Description (Comments): 2 1/2in by 1 inch redness, non blanching  Present on Admission: Yes    Physical Exam: Vital Signs Blood pressure 137/72, pulse 70, temperature (!) 97.5 F (36.4 C), temperature source Oral, resp. rate 16, height 5\' 5"  (1.651 m), weight 64.4 kg, SpO2 100 %.   General: No acute distress  Heart: Regular rate and rhythm no rubs murmurs or extra sounds Lungs: Clear to auscultation, breathing unlabored, no rales or wheezes Abdomen: Positive bowel sounds, soft nontender to palpation, nondistended Extremities: No clubbing, cyanosis, or edema Skin: No evidence of breakdown, no evidence of rash  Neurologic: Awake, alert, does not respond to orientation quesitons. Moving BL LE and LUE in bed. Flaccid RUE. Does not follow commands for strength testing. Responsive and localizing to pain except in RUE.      Assessment/Plan: 1. Functional deficits which require 3+ hours per day of  interdisciplinary therapy in a comprehensive inpatient rehab setting. Physiatrist is providing close team supervision and 24 hour management of active medical problems listed below. Physiatrist and rehab team continue to assess barriers to discharge/monitor patient progress toward functional and medical goals  Care Tool:  Bathing    Body parts bathed by patient: Chest, Face, Front perineal area, Right arm, Abdomen, Right upper leg, Left upper leg   Body parts bathed by helper: Left arm, Buttocks, Right lower leg, Left lower leg     Bathing assist Assist Level: Moderate Assistance - Patient 50 - 74%     Upper Body Dressing/Undressing Upper body dressing   What is the patient wearing?: Pull over shirt    Upper body assist Assist Level: Moderate Assistance - Patient 50 - 74%    Lower Body Dressing/Undressing Lower body dressing      What is the patient wearing?: Incontinence brief, Pants     Lower body assist Assist for lower body dressing: Maximal Assistance - Patient 25 - 49%     Toileting Toileting    Toileting assist Assist for toileting: Total Assistance - Patient < 25%     Transfers Chair/bed transfer  Transfers assist  Chair/bed transfer activity did not occur: Safety/medical concerns  Chair/bed transfer  assist level: Minimal Assistance - Patient > 75%     Locomotion Ambulation   Ambulation assist   Ambulation activity did not occur: Safety/medical concerns          Walk 10 feet activity   Assist  Walk 10 feet activity did not occur: Safety/medical concerns        Walk 50 feet activity   Assist Walk 50 feet with 2 turns activity did not occur: Safety/medical concerns         Walk 150 feet activity   Assist Walk 150 feet activity did not occur: Safety/medical concerns         Walk 10 feet on uneven surface  activity   Assist Walk 10 feet on uneven surfaces activity did not occur: Safety/medical concerns          Wheelchair     Assist Is the patient using a wheelchair?: Yes (Did not use one PTA) Type of Wheelchair: Manual Wheelchair activity did not occur: Safety/medical concerns         Wheelchair 50 feet with 2 turns activity    Assist    Wheelchair 50 feet with 2 turns activity did not occur: Safety/medical concerns       Wheelchair 150 feet activity     Assist  Wheelchair 150 feet activity did not occur: Safety/medical concerns       Blood pressure 137/72, pulse 70, temperature (!) 97.5 F (36.4 C), temperature source Oral, resp. rate 16, height 5\' 5"  (1.651 m), weight 64.4 kg, SpO2 100 %.  Medical Problem List and Plan: 1. 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  Antithrombotics: -DVT/anticoagulation:  Pharmaceutical: Lovenox             -antiplatelet therapy: DAPT X 3 months followed by ASA alone.  3. Pain Management: Tylenol prn.  4. Mood/Behavior/Sleep: LCSW to follow for evaluation and support.              -antipsychotic agents: N/A 5. Neuropsych/cognition: This patient is not fully capable of making decisions on his own behalf. 6. Skin/Wound Care: Routine pressure relief measures.  7. Fluids/Electrolytes/Nutrition: Monitor I/O. Check CMET in am for follow up on AKI/LFTs.         HypoK+ supplement and recheck K+ - WNL HypoK+ will supplement , not on diuretics but had several stools on 8/27 8. L-MCA infarct with hemorrhagic conversion: DAPT X 90 day followed by ASA alone 9. HTN: Monitor BP TID. BP remains labile. Resume Proscar.  --Continue Metoprolol--was titrated upwards 08/27 for better control.  Vitals:   02/20/22 1950 02/21/22 0509  BP: 132/63 137/72  Pulse: 78 70  Resp: 18 16  Temp: 98 F (36.7 C) (!) 97.5 F (36.4 C)  SpO2: 98% 100%    10. Dysphagia: Continue D1, honey thick liquids. Needs  assistance for feeding and supervision for safety.  --hypernatremia/AKI likely due to dysphagia diet 11. BPH: Monitor for any voiding difficulties. Will order PVR checks as off his meds.  --Proscar was not resumed. Flomax was d/c on 08/20.  12. Pre-renal azotemia: Offer fluids between meals.  --Indicated thirst -->drank honey liquids without negative expression.  -supplement with IVF if needed 13. Covid +  asymptomatic,WBC nl.  monitor for cough fever, sore throat, no antiviral unless symptomatic, maintain isolation in room 5-7 d per ID, N95 masking in room  --continue to monitor for any signs of infection/aspiration.     Latest Ref Rng & Units 02/18/2022    5:05 AM 02/16/2022    5:27 AM 02/13/2022    6:11 AM  CBC  WBC 4.0 - 10.5 K/uL 8.0  10.2  10.4   Hemoglobin 13.0 - 17.0 g/dL 37.0  48.8  89.1   Hematocrit 39.0 - 52.0 % 38.0  40.8  38.9   Platelets 150 - 400 K/uL 220  276  248        LOS: 4 days A FACE TO FACE EVALUATION WAS PERFORMED  Angelina Sheriff 02/21/2022, 12:03 PM

## 2022-02-21 NOTE — Progress Notes (Signed)
Speech Language Pathology Daily Session Note  Patient Details  Name: Paul Bradshaw MRN: [id number] Date of Birth: 12/22/1925  Today's Date: 02/21/2022 SLP Individual Time: 6226-3335 SLP Individual Time Calculation (min): 45 min and Today's Date: 02/21/2022 SLP Missed Time: 15 Minutes Missed Time Reason: Patient fatigue  Short Term Goals: Week 1: SLP Short Term Goal 1 (Week 1): Patient will participate in therapeutic PO trials with minimal overt s/sx of aspiration with mod A verbal cues for implementation of swallowing safety SLP Short Term Goal 2 (Week 1): Patient will respond to biographical and environmental yes/no questions with 75% accuracy given moderate A verbal cues SLP Short Term Goal 3 (Week 1): Patient will follow one-step commands with 50% accuracy given max A multimodal cues SLP Short Term Goal 4 (Week 1): Patient will identify field of 2 objects with 25% accuracy given max A multimodal cues SLP Short Term Goal 5 (Week 1): Pt will vocalize during 10% of opportunities during speech/language tasks with max A multimodal cues SLP Short Term Goal 6 (Week 1): Pt will communicate functional needs through multimodal means with max A multimodal cues  Skilled Therapeutic Interventions: Skilled ST treatment focused on language goals. Pt was semi-reclined in bed on arrival and agreeable to ST intervention with head nod "yes." Pt exhibited what was perceived to be intermittent congested cough and consistent throat clear throughout session. Cough was unproductive. Notified nurse of throat clearing/congestion. Participation was limited this date 2/2 sleepiness.  To assess stimulability with low tech communication supports, SLP facilitated picture ID in field of 2 with 42% (3/7) accuracy given max A multimodal cues. Pt followed one-step directions to point to picture with left hand with max progressing to total A cues in order to execute during 40% (2/5) occasions. Pt responded to yes/no questions  regarding pictures (e.g., "is this a toothbrush?") with (16%) 1/6 accuracy and eventually discontinued responses d/t sleepiness. At this time, pt exhibits minimal stimulability for use of low tech communication supports. Plan to continue efforts. Pt was not stimulable for use of gestures with the exception of head nods to yes/no questions.   Pt vocalized during speech/language tasks during 30% (4/13) occasions involving "ah" x1, and unintelligible vocalizations x3 during "happy birthday" song with max A multimodal cues including intoning, hand tapping, cloze phrase, expectant look, and verbal repetition.    Session was concluded 15 minutes early due to difficulty maintaining arousal. Patient was left in bed with alarm activated and immediate needs within reach at end of session. Continue per current plan of care.      Pain Pain Assessment Pain Scale: PAINAD PAINAD (Pain Assessment in Advanced Dementia) Breathing: normal Negative Vocalization: none Facial Expression: smiling or inexpressive Body Language: relaxed Consolability: no need to console PAINAD Score: 0  Therapy/Group: Individual Therapy  Tamala Ser 02/21/2022, 8:18 AM

## 2022-02-21 NOTE — Progress Notes (Signed)
Son Casimiro Needle called x 2 within 30 minutes. Nurse taking care of another patient. Attempted to call back at [phone]. No answer and no voicemail.

## 2022-02-21 NOTE — Progress Notes (Signed)
Occupational Therapy Session Note  Patient Details  Name: Paul Bradshaw MRN: [id number] Date of Birth: Apr 27, 1926  Today's Date: 02/21/2022 OT Individual Time: 1030-1103 OT Individual Time Calculation (min): 33 min  and Today's Date: 02/21/2022 OT Missed Time: 12 Minutes Missed Time Reason: Patient fatigue   Short Term Goals: Week 1:  OT Short Term Goal 1 (Week 1): Pt will be able to sit to stand to prep for LB dressing/ toileting with min A. OT Short Term Goal 2 (Week 1): Pt will be able to stand pivot to [facility] with mod A. OT Short Term Goal 3 (Week 1): Pt will be able to stand upright with min A to increase safety with standing during clothing management. OT Short Term Goal 4 (Week 1): Pt will don shirt with mod A. OT Short Term Goal 5 (Week 1): Pt will demonstrate awareness of RUE by moving R arm into position with LUE when cued.  Skilled Therapeutic Interventions/Progress Updates:    Pt received in bed sleeping. It took him a few minutes to wake up. Rehab tech present to assist me with his mobility as pt was very lethargic.  Pt sat to EOB max A,  able to sit unsupported. During bed mobility, pt grimacing with RUE mobility but unable to accurately nod head yes or no to tell me if his arm was hurting.  Pt needed a heavy MAX A to stand and pivot to [facility].  Decreased initiation with self care sitting on BSC needing max A overall. He did actively lift arm to 20-30 degrees of abduction to wash under arm.  No void on toilet.  Max to pivot to recliner.  Began to work on [employer] but pt fell asleep right away. Unable to get pt to keep his eyes open so allowed him to rest.   Pillows supporting R arm, splint on to support wrist. Belt alarm on and all needs met. Pt reclined back in recliner with feet elevated to enable him to sleep safely in the recliner.   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 Restrictions Weight Bearing Restrictions: No    Vital Signs: Therapy Vitals Temp: (!) 97.5 F (36.4 C) Temp Source: Oral Pulse Rate: 70 Resp: 16 BP: 137/72 Patient Position (if appropriate): Sitting Oxygen Therapy SpO2: 100 % O2 Device: Room Air Pain: Pain Assessment Pain Scale: PAINAD PAINAD (Pain Assessment in Advanced Dementia) Breathing: normal Negative Vocalization: none Facial Expression: smiling or inexpressive Body Language: relaxed Consolability: no need to console PAINAD Score: 0 ADL: ADL Eating: Maximal assistance Grooming: Moderate assistance Upper Body Bathing: Moderate assistance Where Assessed-Upper Body Bathing: Other (Comment) (BSC) Lower Body Bathing: Moderate assistance Where Assessed-Lower Body Bathing: Other (Comment) (BSC) Upper Body Dressing: Maximal assistance Lower Body Dressing: Maximal assistance Toileting: Dependent Where Assessed-Toileting: Bedside Commode Toilet Transfer: Maximal assistance Toilet Transfer Method: Stand pivot Toilet Transfer Equipment: Bedside commode   Therapy/Group: Individual Therapy  [place] 02/21/2022, 8:38 AM

## 2022-02-21 NOTE — Progress Notes (Signed)
Physical Therapy Session Note  Patient Details  Name: Paul Bradshaw MRN: [id number] Date of Birth: [birth year]  Today's Date: 02/21/2022 PT Individual Time: 1420-1500 PT Individual Time Calculation (min): 40 min   Short Term Goals: Week 1:  PT Short Term Goal 1 (Week 1): Pt will perform bed mobility with overall CGA and using no bed features. PT Short Term Goal 2 (Week 1): Pt will perform all functional transfers with light MinA/ CGA and LRAD. PT Short Term Goal 3 (Week 1): Pt will ambulate at least 40 ft using LRAD with CGA/ MinA. PT Short Term Goal 4 (Week 1): Pt will initiate stair training. PT Short Term Goal 5 (Week 1): Pt will perform Berg Balance test.   Skilled Therapeutic Interventions/Progress Updates:   Pt received sitting in recliner. Slumped in severe slouched position. Mod assist for improved position in WC. Noted to have been incontinent of bladder. Stand pivot transfer with RW and max assist from PT and BUE supported on RW. Continent bowel movement sitting on toilet. Hygiene performed by PT with min assist to stand and mod assist to achieve erect posture.   Stand pivot transfer with max assist from PT to [facility]. Stadning balance standing at RW with visual feedback from mirror at sink. Pt performed forward and cross body reach with the LUE to tap on cone x 4 each. Reciprocal march at sink with BUE supported on RW and R hand splint. Max cues from PT for use of mirror to correct posture and to perform movements with BLE and LUE at sink. Pt then impulsively tryin to walk back to bed despite obstacles. Mod assist to return to sitting in WC.   WC positioned pointed to bed. Gait training in room to bed with mod assist with RW. Assist from PT for improved posture and cues for increased step length on the RLE at pelvis.   Sitting EOB pt performs BLE LAQ 2 x 10.   Sit>supine completed with mod assist at RUE/RLE, and left supine in bed with call bell in reach and all needs met.         Therapy Documentation Precautions:  Precautions Precautions: Fall Precaution Comments: R hemipareisis, expressive>receptive aphasia Restrictions Weight Bearing Restrictions: No   Pain:  Faces: none    Therapy/Group: Individual Therapy  Lorie Phenix 02/21/2022, 3:30 PM

## 2022-02-21 NOTE — Progress Notes (Addendum)
Spoke to patients son Paul Bradshaw at 1300. Patient son educated on no visitors until patient is retested for Covid. Patient continues to have cough and congestion.   1843 Spoke to son Paul Bradshaw regarding patient. Paul Bradshaw informed no changes at this time. Patient right side remains flaccid, patient is non verbal, incontinent of B&B, dependent feeder

## 2022-02-22 DIAGNOSIS — I63512 Cerebral infarction due to unspecified occlusion or stenosis of left middle cerebral artery: Secondary | ICD-10-CM | POA: Diagnosis not present

## 2022-02-22 NOTE — Progress Notes (Addendum)
Speech Language Pathology Daily Session Note  Patient Details  Name: Paul Bradshaw MRN: [id number] Date of Birth: Jun 04, 1926  Today's Date: 02/22/2022 SLP Individual Time: 1415-1500 SLP Individual Time Calculation (min): 45 min  Short Term Goals: Week 1: SLP Short Term Goal 1 (Week 1): Patient will participate in therapeutic PO trials with minimal overt s/sx of aspiration with mod A verbal cues for implementation of swallowing safety - Pt participated in PO trials of Dysphagia 2 textures and honey-thick liquid via cup with minimal s/sx concerning for aspiration to include occasion throat clear and cough. Per chart review, VSS. Will continue to monitor. Max to Total A for feeding and adherence to aspiration precautions.  SLP Short Term Goal 2 (Week 1): Patient will respond to biographical and environmental yes/no questions with 75% accuracy given moderate A verbal cues. - Pt responded to yes/no questions (head nods) re: biographical and environmental information with ~50% accuracy given overall Mod A verbal and visual cues.  SLP Short Term Goal 3 (Week 1): Patient will follow one-step commands with 50% accuracy given max A multimodal cues - Pt followed one-step commands on ~60% of opportunities given Mod-Max A multimodal cues.  SLP Short Term Goal 4 (Week 1): Patient will identify field of 2 objects with 25% accuracy given max A multimodal cues. - 0% given Max A.  SLP Short Term Goal 5 (Week 1): Pt will vocalize during 10% of opportunities during speech/language tasks with max A multimodal cues. - Pt vocalized during <10% of opportunities during therapeutic speech and language tasks given Max A multimodal cues.  SLP Short Term Goal 6 (Week 1): Pt will communicate functional needs through multimodal means with max A multimodal cues. - Pt communicated mostly via head nod and no head nod for yes/no questions given Max A multimodal cues (~60% of the time), though accuracy was not reliable.  Skilled  Therapeutic Interventions: S: Pt seen this date for skilled ST intervention targeting deglutition and communication/language goals outlined above. Pt received awake/alert and OOB in recliner chair. Agreeable to ST intervention in hospital room via head nod.  O: Please see above for objective data re: pt's performance on targeted goals.   A: Pt appeared minimally sitmulable for skilled ST intervention. Recommend continuation of current diet consistencies (Dysphagia 1 with honey-thick liquids via cup) given adherence to aspiration precautions and full supervision + Total A, as needed. May consider trial tray of Dysphagia 2 textures in upcoming sessions.  P: Pt left in recliner chair with all safety measures activated. Call bell reviewed and within reach and all immediate needs met. Continue per current ST POC next session.  Pain PAINAD (Pain Assessment in Advanced Dementia) Breathing: normal Negative Vocalization: none Facial Expression: smiling or inexpressive Body Language: relaxed Consolability: no need to console PAINAD Score: 0  Therapy/Group: Individual Therapy  Jordie Skalsky A Teondre Jarosz 02/22/2022, 4:16 PM

## 2022-02-22 NOTE — Progress Notes (Signed)
Physical Therapy Session Note  Patient Details  Name: Paul Bradshaw MRN: [id number] Date of Birth: [birth year]  Today's Date: 02/22/2022  Skilled Therapeutic Interventions/Progress Updates:   Hospital on lock-down at time of scheduled PT treatment session. Will re-attempt at later time/date.      Therapy Documentation Precautions:  Precautions Precautions: Fall Precaution Comments: R hemipareisis, expressive>receptive aphasia Restrictions Weight Bearing Restrictions: No General: PT Amount of Missed Time (min): 60 Minutes PT Missed Treatment Reason: Other (Comment) (facility related emegency)   Golden Pop 02/22/2022, 6:22 PM

## 2022-02-22 NOTE — Progress Notes (Signed)
PROGRESS NOTE   Subjective/Complaints:  Pt seen sitting up in bedside chair-  Sleeping- denies pain Per nursing, doing well   ROS- limited due to aphasia  Objective:   No results found. No results for input(s): "WBC", "HGB", "HCT", "PLT" in the last 72 hours.  Recent Labs    02/21/22 0754  K 4.2    Intake/Output Summary (Last 24 hours) at 02/22/2022 1620 Last data filed at 02/22/2022 1400 Gross per 24 hour  Intake 828 ml  Output --  Net 828 ml     Pressure Injury 02/07/22 Perineum Medial Stage 1 -  Intact skin with non-blanchable redness of a localized area usually over a bony prominence. 2 1/2in by 1 inch redness, non blanching (Active)  02/07/22 1327  Location: Perineum  Location Orientation: Medial  Staging: Stage 1 -  Intact skin with non-blanchable redness of a localized area usually over a bony prominence.  Wound Description (Comments): 2 1/2in by 1 inch redness, non blanching  Present on Admission: Yes    Physical Exam: Vital Signs Blood pressure 121/64, pulse 85, temperature 98.3 F (36.8 C), temperature source Oral, resp. rate 16, height 5\' 5"  (1.651 m), weight 64.4 kg, SpO2 100 %.    General: awake, alert, appropriate, sitting up in bedside chair; sleeping initially, but woke to stimuli; NAD HENT: conjugate gaze; oropharynx moist CV: regular rate; no JVD Pulmonary: CTA B/L; no W/R/R- good air movement GI: soft, NT, ND, (+)BS Psychiatric: appropriate- but flat-  Neurological: aphasic- nods head yes and no however nonverbal this AM Skin: No evidence of breakdown, no evidence of rash  Neurologic: Awake, alert, does not respond to orientation quesitons. Moving BL LE and LUE in bed. Flaccid RUE. Does not follow commands for strength testing. Responsive and localizing to pain except in RUE.      Assessment/Plan: 1. Functional deficits which require 3+ hours per day of interdisciplinary therapy in a  comprehensive inpatient rehab setting. Physiatrist is providing close team supervision and 24 hour management of active medical problems listed below. Physiatrist and rehab team continue to assess barriers to discharge/monitor patient progress toward functional and medical goals  Care Tool:  Bathing    Body parts bathed by patient: Chest, Face, Abdomen, Right upper leg, Left upper leg   Body parts bathed by helper: Right arm, Left arm, Buttocks, Front perineal area, Right lower leg, Left lower leg     Bathing assist Assist Level: Moderate Assistance - Patient 50 - 74%     Upper Body Dressing/Undressing Upper body dressing   What is the patient wearing?: Pull over shirt    Upper body assist Assist Level: Moderate Assistance - Patient 50 - 74%    Lower Body Dressing/Undressing Lower body dressing      What is the patient wearing?: Incontinence brief, Pants     Lower body assist Assist for lower body dressing: Maximal Assistance - Patient 25 - 49%     Toileting Toileting    Toileting assist Assist for toileting: Total Assistance - Patient < 25%     Transfers Chair/bed transfer  Transfers assist  Chair/bed transfer activity did not occur: Safety/medical concerns  Chair/bed transfer assist level:  Minimal Assistance - Patient > 75%     Locomotion Ambulation   Ambulation assist   Ambulation activity did not occur: Safety/medical concerns          Walk 10 feet activity   Assist  Walk 10 feet activity did not occur: Safety/medical concerns        Walk 50 feet activity   Assist Walk 50 feet with 2 turns activity did not occur: Safety/medical concerns         Walk 150 feet activity   Assist Walk 150 feet activity did not occur: Safety/medical concerns         Walk 10 feet on uneven surface  activity   Assist Walk 10 feet on uneven surfaces activity did not occur: Safety/medical concerns         Wheelchair     Assist Is the  patient using a wheelchair?: Yes (Did not use one PTA) Type of Wheelchair: Manual Wheelchair activity did not occur: Safety/medical concerns         Wheelchair 50 feet with 2 turns activity    Assist    Wheelchair 50 feet with 2 turns activity did not occur: Safety/medical concerns       Wheelchair 150 feet activity     Assist  Wheelchair 150 feet activity did not occur: Safety/medical concerns       Blood pressure 121/64, pulse 85, temperature 98.3 F (36.8 C), temperature source Oral, resp. rate 16, height 5\' 5"  (1.651 m), weight 64.4 kg, SpO2 100 %.  Medical Problem List and Plan: 1. 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  Antithrombotics: -DVT/anticoagulation:  Pharmaceutical: Lovenox             -antiplatelet therapy: DAPT X 3 months followed by ASA alone.  3. Pain Management: Tylenol prn.  4. Mood/Behavior/Sleep: LCSW to follow for evaluation and support.              -antipsychotic agents: N/A 5. Neuropsych/cognition: This patient is not fully capable of making decisions on his own behalf due to aphasia. 6. Skin/Wound Care: Routine pressure relief measures.  7. Fluids/Electrolytes/Nutrition: Monitor I/O. Check CMET in am for follow up on AKI/LFTs.         HypoK+ supplement and recheck K+ - WNL HypoK+ will supplement , not on diuretics but had several stools on 8/27  9/2- last K+ 4.2- doing well 8. L-MCA infarct with hemorrhagic conversion: DAPT X 90 day followed by ASA alone 9. HTN: Monitor BP TID. BP remains labile. Resume Proscar.  --Continue Metoprolol--was titrated upwards 08/27 for better control.   9/2- BP controlled- con't regimen Vitals:   02/22/22 0758 02/22/22 1504  BP: 111/81 121/64  Pulse:  85  Resp:  16  Temp:  98.3 F (36.8 C)  SpO2:  100%    10. Dysphagia:  Continue D1, honey thick liquids. Needs assistance for feeding and supervision for safety.  --hypernatremia/AKI likely due to dysphagia diet 11. BPH: Monitor for any voiding difficulties. Will order PVR checks as off his meds.  --Proscar was not resumed. Flomax was d/c on 08/20.  12. Pre-renal azotemia: Offer fluids between meals.  --Indicated  thirst -->drank honey liquids without negative expression.  -supplement with IVF if needed 92- will recheck Monday and see if doing better- last BUN 26- improving 13. Covid + asymptomatic,WBC nl.  monitor for cough fever, sore throat, no antiviral unless symptomatic, maintain isolation in room 5-7 d per ID, N95 masking in room  --continue to monitor for any signs of infection/aspiration.     Latest Ref Rng & Units 02/18/2022    5:05 AM 02/16/2022    5:27 AM 02/13/2022    6:11 AM  CBC  WBC 4.0 - 10.5 K/uL 8.0  10.2  10.4   Hemoglobin 13.0 - 17.0 g/dL 49.8  26.4  15.8   Hematocrit 39.0 - 52.0 % 38.0  40.8  38.9   Platelets 150 - 400 K/uL 220  276  248        LOS: 5 days A FACE TO FACE EVALUATION WAS PERFORMED  Jamaris Biernat 02/22/2022, 4:20 PM

## 2022-02-22 NOTE — Progress Notes (Signed)
Occupational Therapy Session Note  Patient Details  Name: ARISTEO HANKERSON MRN: [id number] Date of Birth: 19-Nov-1925  Today's Date: 02/22/2022 OT Individual Time: 0355-9741 OT Individual Time Calculation (min): 60 min    Short Term Goals: Week 1:  OT Short Term Goal 1 (Week 1): Pt will be able to sit to stand to prep for LB dressing/ toileting with min A. OT Short Term Goal 2 (Week 1): Pt will be able to stand pivot to [facility] with mod A. OT Short Term Goal 3 (Week 1): Pt will be able to stand upright with min A to increase safety with standing during clothing management. OT Short Term Goal 4 (Week 1): Pt will don shirt with mod A. OT Short Term Goal 5 (Week 1): Pt will demonstrate awareness of RUE by moving R arm into position with LUE when cued.  Skilled Therapeutic Interventions/Progress Updates:    Upon OT arrival, pt semi recumbent in bed. Pt unable to locate or rate pain but shakes his head "yes" when asked if he has pain. Pt agreeable to OT treatment session. Treatment intervention with a focus on self care retraining, activity tolerance, sitting balance, and functional mobility. Pt completes supine to sit transfer with Min A. Pt completes sponge bath ADL seated EOB at the levels below. Pt able to sit with mostly CGA and occasional Min A when fatigued. Pt requires short seated rest breaks during session secondary to fatigue. Pt requires mod verbal cues throughout ADL and does grimace and moan with movement to the R UE. Pt completes sit to stand transfer with Mod A using RW with R saddle splint and is able to stand ~3 minutes for peri care and brief management before seated rest break. Pt stands another trial with Mod A to manage pants and ambulates to chair ~5 feet with RW and Min A for sequencing and motor planning. Manual and verbal cues provided to safely complete. Pt completes transfer from chair to recliner using stand step technique with Mod A for transitioning using RW and completes stand to  sit transfer with Min A. Pt was left in recliner at end of session with all needs met and safety measures in place.   Therapy Documentation Precautions:  Precautions Precautions: Fall Precaution Comments: R hemipareisis, expressive>receptive aphasia Restrictions Weight Bearing Restrictions: No  ADL: Grooming: Moderate cueing, Supervision/safety Where Assessed-Grooming: Edge of bed Upper Body Bathing: Moderate assistance Where Assessed-Upper Body Bathing: Edge of bed Lower Body Bathing: Maximal assistance Where Assessed-Lower Body Bathing: Edge of bed (sitting and standing with RW) Upper Body Dressing: Moderate assistance Where Assessed-Upper Body Dressing: Edge of bed Lower Body Dressing: Maximal assistance Where Assessed-Lower Body Dressing: Edge of bed Toileting: Dependent (brief soiled) Where Assessed-Toileting: Other (Comment) (edge of bed)   Therapy/Group: Individual Therapy  Westlee Devita 02/22/2022, 11:03 AM

## 2022-02-23 DIAGNOSIS — I63512 Cerebral infarction due to unspecified occlusion or stenosis of left middle cerebral artery: Secondary | ICD-10-CM | POA: Diagnosis not present

## 2022-02-23 NOTE — Progress Notes (Signed)
Occupational Therapy Session Note  Patient Details  Name: Paul Bradshaw MRN: [id number] Date of Birth: [birth year]  Today's Date: 02/23/2022 OT Individual Time: 4098-1191 OT Individual Time Calculation (min): 57 min    Short Term Goals: Week 1:  OT Short Term Goal 1 (Week 1): Pt will be able to sit to stand to prep for LB dressing/ toileting with min A. OT Short Term Goal 2 (Week 1): Pt will be able to stand pivot to [facility] with mod A. OT Short Term Goal 3 (Week 1): Pt will be able to stand upright with min A to increase safety with standing during clothing management. OT Short Term Goal 4 (Week 1): Pt will don shirt with mod A. OT Short Term Goal 5 (Week 1): Pt will demonstrate awareness of RUE by moving R arm into position with LUE when cued.  Skilled Therapeutic Interventions/Progress Updates:    Pt in bed upon arrival with NT present feeding breakfast to pt. OT intervention with focus on self feeding, following one step commands, bed mobility, and activity tolerance to increase independence with BADLs. Pt nods approval when presented with food items and liquids. Attempted to have pt self feed but pt unable to replicate demonstration cues. Tot A for self feeding. Pt washed face when presented with wash cloth. Pt wiped mouth when presented with napkin with demonstration cues. Pt rolled to Rt when requested to facilitate changing incontinent brief-mod A. Pt required max A to roll to Lt. Pt unable to initiate rolling to Lt. Pt remained in bed with all needs within reach and bed alarm activated.   Therapy Documentation Precautions:  Precautions Precautions: Fall Precaution Comments: R hemipareisis, expressive>receptive aphasia Restrictions Weight Bearing Restrictions: No Pain:  No s/s of pain  Therapy/Group: Individual Therapy  [name] 02/23/2022, 9:58 AM

## 2022-02-23 NOTE — Progress Notes (Signed)
Physical Therapy Session Note  Patient Details  Name: Paul Bradshaw MRN: [id number] Date of Birth: [birth year]  Today's Date: 02/23/2022 PT Individual Time: 1300-1345 PT Individual Time Calculation (min): 45 min   Short Term Goals: Week 1:  PT Short Term Goal 1 (Week 1): Pt will perform bed mobility with overall CGA and using no bed features. PT Short Term Goal 2 (Week 1): Pt will perform all functional transfers with light MinA/ CGA and LRAD. PT Short Term Goal 3 (Week 1): Pt will ambulate at least 40 ft using LRAD with CGA/ MinA. PT Short Term Goal 4 (Week 1): Pt will initiate stair training. PT Short Term Goal 5 (Week 1): Pt will perform Berg Balance test.  Skilled Therapeutic Interventions/Progress Updates: Pt presents supine and requires stim for arousal and to participate w/ therapy.  Pt requires max A for sup to sit at EOB.  Pt able to maintain seated balance w/ and w/o UE support.  Pt transferred sit to stand w/ max A and SPT to w/c w/ same.  Pt able to take small steps w/ facilitation for wt shifting.  Pt encouraged to reach forward outside of BOS w/ min participation.  Pt appears fatigued, closing eyes throughout.  Pt performed LAQ w/ B LE s x 5 w/ verbal and visual cues for increased ROM.  Pt performed SPT w/ max A, manual facilitation for minimal stepping.  Pt required max A for sit to supine in bed.  Bed alarm on and all needs in reach.     Therapy Documentation Precautions:  Precautions Precautions: Fall Precaution Comments: R hemipareisis, expressive>receptive aphasia Restrictions Weight Bearing Restrictions: No General:   Vital Signs: Therapy Vitals Temp: 97.8 F (36.6 C) Temp Source: Oral Pulse Rate: 85 Resp: 16 BP: 111/68 Patient Position (if appropriate): Sitting Oxygen Therapy SpO2: 98 % O2 Device: Room Air Pain:0/10 Pain Assessment Pain Scale: 0-10 Pain Score: 0-No pain    Therapy/Group: Individual Therapy  Lucio Edward 02/23/2022, 1:52 PM

## 2022-02-23 NOTE — Progress Notes (Signed)
PROGRESS NOTE   Subjective/Complaints:  Pt sleeping upright in bedside chair- denies any issues Aphasic.   ROS- limited due to aphasia  Objective:   No results found. No results for input(s): "WBC", "HGB", "HCT", "PLT" in the last 72 hours.  Recent Labs    02/21/22 0754  K 4.2    Intake/Output Summary (Last 24 hours) at 02/23/2022 1014 Last data filed at 02/23/2022 0900 Gross per 24 hour  Intake 1270 ml  Output --  Net 1270 ml     Pressure Injury 02/07/22 Perineum Medial Stage 1 -  Intact skin with non-blanchable redness of a localized area usually over a bony prominence. 2 1/2in by 1 inch redness, non blanching (Active)  02/07/22 1327  Location: Perineum  Location Orientation: Medial  Staging: Stage 1 -  Intact skin with non-blanchable redness of a localized area usually over a bony prominence.  Wound Description (Comments): 2 1/2in by 1 inch redness, non blanching  Present on Admission: Yes    Physical Exam: Vital Signs Blood pressure 104/75, pulse (!) 102, temperature 98.7 F (37.1 C), temperature source Oral, resp. rate 18, height 5\' 5"  (1.651 m), weight 64.4 kg, SpO2 100 %.     General: awake, alert, calm- woke from sleep; up in bedside chair;  NAD HENT: conjugate gaze; oropharynx moist CV: regular rate; no JVD Pulmonary: CTA B/L; no W/R/R- good air movement GI: soft, NT, ND, (+)BS Psychiatric: appropriate- calm Neurological: aphasic- not speaking- nodding head yes and no Neurologic: Awake, alert, does not respond to orientation quesitons. Moving BL LE and LUE in bed. Flaccid RUE. Does not follow commands for strength testing. Responsive and localizing to pain except in RUE.      Assessment/Plan: 1. Functional deficits which require 3+ hours per day of interdisciplinary therapy in a comprehensive inpatient rehab setting. Physiatrist is providing close team supervision and 24 hour management of active  medical problems listed below. Physiatrist and rehab team continue to assess barriers to discharge/monitor patient progress toward functional and medical goals  Care Tool:  Bathing    Body parts bathed by patient: Chest, Face, Abdomen, Right upper leg, Left upper leg   Body parts bathed by helper: Right arm, Left arm, Buttocks, Front perineal area, Right lower leg, Left lower leg     Bathing assist Assist Level: Moderate Assistance - Patient 50 - 74%     Upper Body Dressing/Undressing Upper body dressing   What is the patient wearing?: Pull over shirt    Upper body assist Assist Level: Moderate Assistance - Patient 50 - 74%    Lower Body Dressing/Undressing Lower body dressing      What is the patient wearing?: Incontinence brief, Pants     Lower body assist Assist for lower body dressing: Maximal Assistance - Patient 25 - 49%     Toileting Toileting    Toileting assist Assist for toileting: Total Assistance - Patient < 25%     Transfers Chair/bed transfer  Transfers assist  Chair/bed transfer activity did not occur: Safety/medical concerns  Chair/bed transfer assist level: Minimal Assistance - Patient > 75%     Locomotion Ambulation   Ambulation assist   Ambulation activity  did not occur: Safety/medical concerns          Walk 10 feet activity   Assist  Walk 10 feet activity did not occur: Safety/medical concerns        Walk 50 feet activity   Assist Walk 50 feet with 2 turns activity did not occur: Safety/medical concerns         Walk 150 feet activity   Assist Walk 150 feet activity did not occur: Safety/medical concerns         Walk 10 feet on uneven surface  activity   Assist Walk 10 feet on uneven surfaces activity did not occur: Safety/medical concerns         Wheelchair     Assist Is the patient using a wheelchair?: Yes (Did not use one PTA) Type of Wheelchair: Manual Wheelchair activity did not occur:  Safety/medical concerns         Wheelchair 50 feet with 2 turns activity    Assist    Wheelchair 50 feet with 2 turns activity did not occur: Safety/medical concerns       Wheelchair 150 feet activity     Assist  Wheelchair 150 feet activity did not occur: Safety/medical concerns       Blood pressure 104/75, pulse (!) 102, temperature 98.7 F (37.1 C), temperature source Oral, resp. rate 18, height 5\' 5"  (1.651 m), weight 64.4 kg, SpO2 100 %.  Medical Problem List and Plan: 1. 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  Antithrombotics: -DVT/anticoagulation:  Pharmaceutical: Lovenox             -antiplatelet therapy: DAPT X 3 months followed by ASA alone.  3. Pain Management: Tylenol prn.  4. Mood/Behavior/Sleep: LCSW to follow for evaluation and support.              -antipsychotic agents: N/A 5. Neuropsych/cognition: This patient is not fully capable of making decisions on his own behalf due to aphasia. 6. Skin/Wound Care: Routine pressure relief measures.  7. Fluids/Electrolytes/Nutrition: Monitor I/O. Check CMET in am for follow up on AKI/LFTs.         HypoK+ supplement and recheck K+ - WNL HypoK+ will supplement , not on diuretics but had several stools on 8/27  9/2- last K+ 4.2- doing well 8. L-MCA infarct with hemorrhagic conversion: DAPT X 90 day followed by ASA alone 9. HTN: Monitor BP TID. BP remains labile. Resume Proscar.  --Continue Metoprolol--was titrated upwards 08/27 for better control.  9/3- BP controlled- con't regimen Vitals:   02/22/22 2135 02/23/22 0920  BP: 132/69 104/75  Pulse: 97 (!) 102  Resp:  18  Temp: 99.3 F (37.4 C) 98.7 F (37.1 C)  SpO2: 94% 100%    10. Dysphagia: Continue D1, honey thick liquids. Needs assistance for feeding and supervision for  safety.  --hypernatremia/AKI likely due to dysphagia diet 11. BPH: Monitor for any voiding difficulties. Will order PVR checks as off his meds.  --Proscar was not resumed. Flomax was d/c on 08/20.  12. Pre-renal azotemia: Offer fluids between meals.  --Indicated thirst -->drank honey liquids without negative expression.  -supplement with IVF if needed 92- will  recheck Monday and see if doing better- last BUN 26- improving 13. Covid + asymptomatic,WBC nl.  monitor for cough fever, sore throat, no antiviral unless symptomatic, maintain isolation in room 5-7 d per ID, N95 masking in room  --continue to monitor for any signs of infection/aspiration.     Latest Ref Rng & Units 02/18/2022    5:05 AM 02/16/2022    5:27 AM 02/13/2022    6:11 AM  CBC  WBC 4.0 - 10.5 K/uL 8.0  10.2  10.4   Hemoglobin 13.0 - 17.0 g/dL 32.2  02.5  42.7   Hematocrit 39.0 - 52.0 % 38.0  40.8  38.9   Platelets 150 - 400 K/uL 220  276  248        LOS: 6 days A FACE TO FACE EVALUATION WAS PERFORMED  Paul Bradshaw 02/23/2022, 10:14 AM

## 2022-02-23 NOTE — Progress Notes (Signed)
Speech Language Pathology Daily Session Note  Patient Details  Name: Paul Bradshaw MRN: [id number] Date of Birth: 09/07/1925  Today's Date: 02/23/2022 SLP Individual Time: 0730-0830 SLP Individual Time Calculation (min): 60 min  Short Term Goals: Week 1: SLP Short Term Goal 1 (Week 1): Patient will participate in therapeutic PO trials with minimal overt s/sx of aspiration with mod A verbal cues for implementation of swallowing safety SLP Short Term Goal 2 (Week 1): Patient will respond to biographical and environmental yes/no questions with 75% accuracy given moderate A verbal cues SLP Short Term Goal 3 (Week 1): Patient will follow one-step commands with 50% accuracy given max A multimodal cues SLP Short Term Goal 4 (Week 1): Patient will identify field of 2 objects with 25% accuracy given max A multimodal cues SLP Short Term Goal 5 (Week 1): Pt will vocalize during 10% of opportunities during speech/language tasks with max A multimodal cues SLP Short Term Goal 6 (Week 1): Pt will communicate functional needs through multimodal means with max A multimodal cues  Skilled Therapeutic Interventions:  Pt was seen for skilled ST targeting goals for communication and dysphagia.  Pt was in bed and awake upon therapist's arrival.  When asked how he was doing, pt shook his head and gestured to bed.  Pt was noted to have been incontinent of a large amount urine and his bed linens, incontinence brief, and hospital gown were saturated.   Pt followed 1 step commands in the context of hygiene, donning clean brief and gown, and changing bed linens with min assist multimodal cues.  After hygiene, SLP asked pt if he was feeling better and pt nodded his head yes.  Pt also indicated via head nod that he was hungry for breakfast.  Pt consumed dys 1 textures and honey thick liquids with total assist for self feeding and no overt s/s of aspiration until pt consumed honey thick milk.  Pt demonstrated delayed coughing with  thickened milk both from the carton and from a styrofoam cup.  Recommend that pt remain on his currently prescribed diet.  Pt was left in bed with bed alarm set and call bell within reach.  Continue per current plan of care.    Pain Pain Assessment Pain Scale: 0-10 Pain Score: 0-No pain  Therapy/Group: Individual Therapy  Larya Charpentier, Melanee Spry 02/23/2022, 12:13 PM

## 2022-02-24 ENCOUNTER — Inpatient Hospital Stay (HOSPITAL_COMMUNITY): Payer: Medicare Other

## 2022-02-24 DIAGNOSIS — E43 Unspecified severe protein-calorie malnutrition: Secondary | ICD-10-CM

## 2022-02-24 DIAGNOSIS — I69391 Dysphagia following cerebral infarction: Secondary | ICD-10-CM

## 2022-02-24 DIAGNOSIS — D72823 Leukemoid reaction: Secondary | ICD-10-CM

## 2022-02-24 LAB — URINALYSIS, ROUTINE W REFLEX MICROSCOPIC
Bacteria, UA: NONE SEEN
Bilirubin Urine: NEGATIVE
Glucose, UA: NEGATIVE mg/dL
Hgb urine dipstick: NEGATIVE
Ketones, ur: NEGATIVE mg/dL
Leukocytes,Ua: NEGATIVE
Nitrite: NEGATIVE
Protein, ur: 30 mg/dL — AB
Specific Gravity, Urine: 1.023 (ref 1.005–1.030)
pH: 6 (ref 5.0–8.0)

## 2022-02-24 LAB — CBC
HCT: 39.3 % (ref 39.0–52.0)
Hemoglobin: 13.1 g/dL (ref 13.0–17.0)
MCH: 31.8 pg (ref 26.0–34.0)
MCHC: 33.3 g/dL (ref 30.0–36.0)
MCV: 95.4 fL (ref 80.0–100.0)
Platelets: 358 10*3/uL (ref 150–400)
RBC: 4.12 MIL/uL — ABNORMAL LOW (ref 4.22–5.81)
RDW: 11.6 % (ref 11.5–15.5)
WBC: 15.8 10*3/uL — ABNORMAL HIGH (ref 4.0–10.5)
nRBC: 0 % (ref 0.0–0.2)

## 2022-02-24 NOTE — Progress Notes (Signed)
Ongoing temp this afternoon. Both cxr and ua are unremarkable. UCX pending. Pt without any associated symptoms. RN reports patient is stable. Will continue to monitor closely. F/u CBC ordered for morning.

## 2022-02-24 NOTE — Progress Notes (Signed)
Occupational Therapy Session Note  Patient Details  Name: Paul Bradshaw MRN: [id number] Date of Birth: Sep 16, 1925  Today's Date: 02/24/2022 OT Individual Time: 1100-1155 OT Individual Time Calculation (min): 55 min    Short Term Goals: Week 1:  OT Short Term Goal 1 (Week 1): Pt will be able to sit to stand to prep for LB dressing/ toileting with min A. OT Short Term Goal 2 (Week 1): Pt will be able to stand pivot to [facility] with mod A. OT Short Term Goal 3 (Week 1): Pt will be able to stand upright with min A to increase safety with standing during clothing management. OT Short Term Goal 4 (Week 1): Pt will don shirt with mod A. OT Short Term Goal 5 (Week 1): Pt will demonstrate awareness of RUE by moving R arm into position with LUE when cued.  Skilled Therapeutic Interventions/Progress Updates:    Pt received in bed lethargic and not making eye contact with therapist. Told pt we would start with gentle ROM to his RUE, pt immediately grimaced to light touch. With SLOW progression and pt flat in bed,  able to gently move arm to external rotation and abd to maintain capsule flexibility.  He now has limited wrist extension with some edema over dorsal wrist.  Wrist splint in drawer. Spoke with RN about making sure 2nd shift is applying splint at night. Applied wrist splint with pt grimacing during application but was fine afterwards.  Pt had a wet brief with a small amount of BM (documented in flow sheet).  Pt needed total A with pericare, bed mobility, supine to sit as he was not following 1 step directions today. Tried 3x to have pt stand to pivot to recliner but pt not putting in effort so used stedy lift with a heavy max A to move pt to recliner.   Pt has been able to move more independently in the past few days at a mod -max A level with 1 person assist.  He did not some to be feeling well and kept closing his eyes. RN aware.  Pt resting in recliner with all needs met and belt alarm on. Pillows  supporting arm.   Therapy Documentation Precautions:  Precautions Precautions: Fall Precaution Comments: R hemipareisis, expressive>receptive aphasia Restrictions Weight Bearing Restrictions: No    Vital Signs: Therapy Vitals Temp: 99.1 F (37.3 C) Temp Source: Oral Pulse Rate: 88 Resp: 18 BP: (!) 109/57 Patient Position (if appropriate): Lying Oxygen Therapy SpO2: 96 % O2 Device: Room Air Pain:   ADL: ADL Eating: Maximal assistance Grooming: Moderate cueing, Supervision/safety Where Assessed-Grooming: Edge of bed Upper Body Bathing: Moderate assistance Where Assessed-Upper Body Bathing: Edge of bed Lower Body Bathing: Maximal assistance Where Assessed-Lower Body Bathing: Edge of bed (sitting and standing with RW) Upper Body Dressing: Moderate assistance Where Assessed-Upper Body Dressing: Edge of bed Lower Body Dressing: Maximal assistance Where Assessed-Lower Body Dressing: Edge of bed Toileting: Dependent (brief soiled) Where Assessed-Toileting: Other (Comment) (edge of bed) Toilet Transfer: Maximal assistance Toilet Transfer Method: Stand pivot Toilet Transfer Equipment: Bedside commode   Therapy/Group: Individual Therapy  [place] 02/24/2022, 8:26 AM

## 2022-02-24 NOTE — Progress Notes (Addendum)
Speech Language Pathology Daily Session Note  Patient Details  Name: Paul Bradshaw MRN: [id number] Date of Birth: [birth year]  Today's Date: 02/24/2022 SLP Individual Time: 0730-0820 SLP Individual Time Calculation (min): 50 min  Short Term Goals: Week 1: SLP Short Term Goal 1 (Week 1): Patient will participate in therapeutic PO trials with minimal overt s/sx of aspiration with mod A verbal cues for implementation of swallowing safety. - When given Max A for self-feeding, pt consumed Dysphagia 1 textures with honey-thick liquid via cup with only 2 instances of coughing (once with multiple large bites of puree provided by NT and once with honey-thick milk product). Additionally, wet vocal quality appreciated following honey-thick milk product, which was cleared with cued throat clear prompted by SLP. Pt unable to consistently vocalize; therefore, unable to monitor vocal quality throughout. Oral phase prolonged, particularly for Dysphagia 1 textures and required liquid rinse to clear. Benefited from oral suctioning at the end of the meal.  SLP Short Term Goal 2 (Week 1): Patient will respond to biographical and environmental yes/no questions with 75% accuracy given moderate A verbal cues. - Did not formally address this session. When asked contextual questions, pt noted to nod head "yes" when information appeared accurate and did not provide a response when answer appeared to be "no." Accuracy is not always reliable.  SLP Short Term Goal 3 (Week 1): Patient will follow one-step commands with 50% accuracy given max A multimodal cues - Pt followed one-step commands within functional context ~50% of the time given Min-Mod to Mod A verbal and visual cues, particularly for motor initiation and planning.  SLP Short Term Goal 4 (Week 1): Patient will identify field of 2 objects with 25% accuracy given max A multimodal cues. - Did not formally address this session.  SLP Short Term Goal 5 (Week 1): Pt will  vocalize during 10% of opportunities during speech/language tasks with max A multimodal cues - Pt vocalized during ~20% of opportunities on command to approximate "yeah," "hi," and "ah" given Max A verbal and model prompts.  SLP Short Term Goal 6 (Week 1): Pt will communicate functional needs through multimodal means with max A multimodal cues. - Pt communicated choice of food via eye gaze given visual choice of 2 spoons with different food items on them.  Skilled Therapeutic Interventions: S: Pt seen this date for skilled ST intervention targeting communication and deglutition goals outlined above. Pt received awake/alert and sitting semi-upright in bed with NT present to provide Total A for self-feeding. No indication nor verbalization of pain. Agreeable to ST intervention at bedside via head nod. Per chart review, pt with elevated WBC and low grade fever; therefore, MD ordered CXR. Imaging results reveal no focal consolidations.   O: Please see above for objective data re: pt's performance on targeted goals. Re: deglutition, oral phase noted to be mild to moderately prolonged, with no swallow initiation noted to palpation of thyroid notch, when consuming pureed textures. Improvement in oral transit noted when alternating bites of puree with cup sips of honey-thick liquid. SLP provided bites of puree, while pt held cup of honey-thick liquid and self-administered, given Min to Mod A verbal and/or tactile cues. SLP education is limited due to severity of language impairment; however, provided brief and simple pt education re: ST POC, aspiration precautions, and diet recommendations. Pt unable to demonstrate evidence of learning. Only nods his head in agreement at times. Remains non-verbal. No family present.  A: Pt appears minimally sitmulable for skilled ST  intervention as evident by occasion vocalizations on commands and Max A vs Total A for self-feeding recommended diet this date. Recommend continuation  of current diet textures (Dysphagia 1 and honey-thick liquid via cup).  P: Pt left in bed with all safety measures activated. Call bell reviewed and within reach and all immediate needs met. Continue per current ST POC next session.  Pain PAINAD (Pain Assessment in Advanced Dementia) Breathing: normal Negative Vocalization: none Facial Expression: smiling or inexpressive Body Language: relaxed Consolability: no need to console PAINAD Score: 0  Therapy/Group: Individual Therapy  Althia Egolf A Suprina Mandeville 02/24/2022, 11:21 AM

## 2022-02-24 NOTE — Progress Notes (Signed)
Physical Therapy Session Note  Patient Details  Name: Paul Bradshaw MRN: [id number] Date of Birth: 02-04-1926  Today's Date: 02/24/2022 PT Individual Time: 1512-1601 PT Individual Time Calculation (min): 49 min   Short Term Goals: Week 1:  PT Short Term Goal 1 (Week 1): Pt will perform bed mobility with overall CGA and using no bed features. PT Short Term Goal 2 (Week 1): Pt will perform all functional transfers with light MinA/ CGA and LRAD. PT Short Term Goal 3 (Week 1): Pt will ambulate at least 40 ft using LRAD with CGA/ MinA. PT Short Term Goal 4 (Week 1): Pt will initiate stair training. PT Short Term Goal 5 (Week 1): Pt will perform Berg Balance test.   Skilled Therapeutic Interventions/Progress Updates:  Patient in recliner on entrance to room. Positioning is poor and pt is slid down in chair and leaned to R with significant head tilt to R. Patient awake, alert, and agreeable to PT session.   Patient with indication of pain at distal RUE at start of session. Pain decreases with awareness of movement prior to mobility as well as with overall pt awareness. Pt also appears generally uncomfortable at start of session.   Pt provided with information re: having COVID and son having COVID. Related to pt that family did not leave him in hospital, but rather is being kept away d/t COVID infection. Related that pt's quarantine period and son's quarantine period will both be completed this week and then family will be allowed to visit again. Pt does seem more engaged following information provided but unsure how much pt understood d/t receptive aphasia.   Therapeutic Activity: Pt's BLE lowered from being elevated in recliner and back of recliner brought forward to more upright seated position. NMR for sitting balance. Pt able to follow instructions ~25% of session. Therefore requires totA with use of blanket under pt for repositioning in recliner. TotA for BLE, RUE positioning on pillow prop, and  head neck pillow prop for more neutral position of head/ neck.   Neuromuscular Re-ed: NMR facilitated during session with focus on sitting balance. Pt guided in blocked practice of forward lean. Pt is able to initiate with vc ~25% of practice. Initiation and muscle activation increases with vc/ tc throughout session. Pt is only ever able to reach upright seated position with Max/ TotA during session and not able to lean more forward in order to place feet on floor. Pt providing max effort as seen with pull on armrest with L hand and UE. Minimal trunk engagement. Will require add'l therapy for improved trunk involvement as pt appears to have had decline in past week since evaluation. Pt requires MaxA to sit upright and unable to hold once in position. Resists more forward lean of trunk. Attempt for use of sheet to support hips and BLE in order to provide better positioning as well as assist for upright position.    MaxA provided for upright seated position in order for RN to provide eye medication and Tylenol for pain via pudding. RN also able to perform mouth cleaning with pt more upright.   NMR performed for improvements in motor control and coordination, balance, sequencing, judgement, and self confidence/ efficacy in performing all aspects of mobility at highest level of independence.   Patient seated more upright at end of session with brakes locked, belt alarm set, positioned with pillows in more neutral body position, and all needs within reach with call bell next to L hand. Pt provided with  honey thick cranberry juice to pt's satisfaction.   Therapy Documentation Precautions:  Precautions Precautions: Fall Precaution Comments: R hemipareisis, expressive>receptive aphasia Restrictions Weight Bearing Restrictions: No General:   Vital Signs: Therapy Vitals Temp: (!) 100.4 F (38 C) Temp Source: Oral Pulse Rate: 82 Resp: 17 BP: 122/67 Patient Position (if appropriate): Sitting Oxygen  Therapy SpO2: 94 % O2 Device: Room Air Pain: Pain initially indicated in distal RUE that reduces during session.   Therapy/Group: Individual Therapy  Loel Dubonnet PT, DPT, CSRS 02/24/2022, 4:58 PM

## 2022-02-24 NOTE — Progress Notes (Signed)
PROGRESS NOTE   Subjective/Complaints:  No issues reported by nursing. Pt appears comfortable.   ROS: limited due to language/communication   Objective:   No results found. Recent Labs    02/24/22 0515  WBC 15.8*  HGB 13.1  HCT 39.3  PLT 358    No results for input(s): "NA", "K", "CL", "CO2", "GLUCOSE", "BUN", "CREATININE", "CALCIUM" in the last 72 hours.   Intake/Output Summary (Last 24 hours) at 02/24/2022 1011 Last data filed at 02/24/2022 0834 Gross per 24 hour  Intake 801 ml  Output 69 ml  Net 732 ml     Pressure Injury 02/07/22 Perineum Medial Stage 1 -  Intact skin with non-blanchable redness of a localized area usually over a bony prominence. 2 1/2in by 1 inch redness, non blanching (Active)  02/07/22 1327  Location: Perineum  Location Orientation: Medial  Staging: Stage 1 -  Intact skin with non-blanchable redness of a localized area usually over a bony prominence.  Wound Description (Comments): 2 1/2in by 1 inch redness, non blanching  Present on Admission: Yes    Physical Exam: Vital Signs Blood pressure (!) 109/57, pulse 88, temperature 99.1 F (37.3 C), temperature source Oral, resp. rate 18, height 5\' 5"  (1.651 m), weight 64.4 kg, SpO2 96 %.     Constitutional: No distress . Vital signs reviewed. HEENT: NCAT, EOMI, oral membranes moist Neck: supple Cardiovascular: RRR without murmur. No JVD    Respiratory/Chest: CTA Bilaterally without wheezes or rales. Normal effort    GI/Abdomen: BS +, non-tender, non-distended Ext: no clubbing, cyanosis, or edema Psych: seems to be in decent spirits  Neurologic: Awake, alert, does not respond to orientation quesitons. Does nod Y/N. Moving BL LE and LUE in bed. Flaccid RUE. Does not follow commands for strength testing consistently.  Responsive and localizing to pain except in RUE.      Assessment/Plan: 1. Functional deficits which require 3+ hours per  day of interdisciplinary therapy in a comprehensive inpatient rehab setting. Physiatrist is providing close team supervision and 24 hour management of active medical problems listed below. Physiatrist and rehab team continue to assess barriers to discharge/monitor patient progress toward functional and medical goals  Care Tool:  Bathing    Body parts bathed by patient: Chest, Face, Abdomen, Right upper leg, Left upper leg   Body parts bathed by helper: Right arm, Left arm, Buttocks, Front perineal area, Right lower leg, Left lower leg     Bathing assist Assist Level: Moderate Assistance - Patient 50 - 74%     Upper Body Dressing/Undressing Upper body dressing   What is the patient wearing?: Pull over shirt    Upper body assist Assist Level: Moderate Assistance - Patient 50 - 74%    Lower Body Dressing/Undressing Lower body dressing      What is the patient wearing?: Incontinence brief, Pants     Lower body assist Assist for lower body dressing: Maximal Assistance - Patient 25 - 49%     Toileting Toileting    Toileting assist Assist for toileting: Total Assistance - Patient < 25%     Transfers Chair/bed transfer  Transfers assist  Chair/bed transfer activity did not occur: Safety/medical  concerns  Chair/bed transfer assist level: Moderate Assistance - Patient 50 - 74%     Locomotion Ambulation   Ambulation assist   Ambulation activity did not occur: Safety/medical concerns          Walk 10 feet activity   Assist  Walk 10 feet activity did not occur: Safety/medical concerns        Walk 50 feet activity   Assist Walk 50 feet with 2 turns activity did not occur: Safety/medical concerns         Walk 150 feet activity   Assist Walk 150 feet activity did not occur: Safety/medical concerns         Walk 10 feet on uneven surface  activity   Assist Walk 10 feet on uneven surfaces activity did not occur: Safety/medical concerns          Wheelchair     Assist Is the patient using a wheelchair?: Yes (Did not use one PTA) Type of Wheelchair: Manual Wheelchair activity did not occur: Safety/medical concerns         Wheelchair 50 feet with 2 turns activity    Assist    Wheelchair 50 feet with 2 turns activity did not occur: Safety/medical concerns       Wheelchair 150 feet activity     Assist  Wheelchair 150 feet activity did not occur: Safety/medical concerns       Blood pressure (!) 109/57, pulse 88, temperature 99.1 F (37.3 C), temperature source Oral, resp. rate 18, height 5\' 5"  (1.651 m), weight 64.4 kg, SpO2 96 %.  Medical Problem List and Plan: 1. 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  Antithrombotics: -DVT/anticoagulation:  Pharmaceutical: Lovenox             -antiplatelet therapy: DAPT X 3 months followed by ASA alone.  3. Pain Management: Tylenol prn.  4. Mood/Behavior/Sleep: LCSW to follow for evaluation and support.              -antipsychotic agents: N/A 5. Neuropsych/cognition: This patient is not fully capable of making decisions on his own behalf due to aphasia. 6. Skin/Wound Care: Routine pressure relief measures.  7. Fluids/Electrolytes/Nutrition: Monitor I/O. Check CMET in am for follow up on AKI/LFTs.         HypoK+ supplement and recheck K+ - WNL HypoK+ will supplement , not on diuretics but had several stools on 8/27  9/2- last K+ 4.2- doing well 8. L-MCA infarct with hemorrhagic conversion: DAPT X 90 day followed by ASA alone 9. HTN: Monitor BP TID. BP remains labile. Resume Proscar.  --Continue Metoprolol--was titrated upwards 08/27 for better control.  9/4- BP controlled- con't regimen Vitals:   02/23/22 2009 02/24/22 0505  BP: 114/61 (!) 109/57  Pulse: (!) 103 88   Resp: 18 18  Temp: 99.1 F (37.3 C) 99.1 F (37.3 C)  SpO2: 99% 96%    10. Dysphagia: Continue D1, honey thick liquids. Needs assistance for feeding and supervision for safety.  --hypernatremia/AKI likely due to dysphagia diet 11. BPH: Monitor for any voiding difficulties. Will order PVR checks as off his meds.  --Proscar was not resumed.  Flomax was d/c on 08/20.  12. Pre-renal azotemia: Offer fluids between meals.  --Indicated thirst -->drank honey liquids without negative expression.  -supplement with IVF if needed 9/2- will recheck Monday and see if doing better- last BUN 26- improving 9/4 no BMET ordered for today--check BMET 9/5 13. Covid + asymptomatic,WBC nl.  monitor for cough fever, sore throat, no antiviral unless symptomatic, maintain isolation in room 5-7 d per ID, N95 masking in room  --continue to monitor for any signs of infection/aspiration.     Latest Ref Rng & Units 02/24/2022    5:15 AM 02/18/2022    5:05 AM 02/16/2022    5:27 AM  CBC  WBC 4.0 - 10.5 K/uL 15.8  8.0  10.2   Hemoglobin 13.0 - 17.0 g/dL 13.1  12.5  12.9   Hematocrit 39.0 - 52.0 % 39.3  38.0  40.8   Platelets 150 - 400 K/uL 358  220  276    9/4 WBC's up to 15.8k today---he's had low grade temp -exam generally benign -check cxr and ua, ucx today -f/u CBC 9/5    LOS: 7 days A FACE TO FACE EVALUATION WAS PERFORMED  Meredith Staggers 02/24/2022, 10:11 AM

## 2022-02-25 ENCOUNTER — Inpatient Hospital Stay (HOSPITAL_COMMUNITY): Payer: Medicare Other

## 2022-02-25 LAB — BASIC METABOLIC PANEL
Anion gap: 11 (ref 5–15)
BUN: 24 mg/dL — ABNORMAL HIGH (ref 8–23)
CO2: 26 mmol/L (ref 22–32)
Calcium: 8.5 mg/dL — ABNORMAL LOW (ref 8.9–10.3)
Chloride: 97 mmol/L — ABNORMAL LOW (ref 98–111)
Creatinine, Ser: 1.01 mg/dL (ref 0.61–1.24)
GFR, Estimated: >60 mL/min (ref >60)
Glucose, Bld: 117 mg/dL — ABNORMAL HIGH (ref 70–99)
Potassium: 4.2 mmol/L (ref 3.5–5.1)
Sodium: 134 mmol/L — ABNORMAL LOW (ref 135–145)

## 2022-02-25 LAB — CBC
HCT: 34.5 % — ABNORMAL LOW (ref 39.0–52.0)
Hemoglobin: 11.5 g/dL — ABNORMAL LOW (ref 13.0–17.0)
MCH: 32.3 pg (ref 26.0–34.0)
MCHC: 33.3 g/dL (ref 30.0–36.0)
MCV: 96.9 fL (ref 80.0–100.0)
Platelets: 333 10*3/uL (ref 150–400)
RBC: 3.56 MIL/uL — ABNORMAL LOW (ref 4.22–5.81)
RDW: 11.5 % (ref 11.5–15.5)
WBC: 17.6 10*3/uL — ABNORMAL HIGH (ref 4.0–10.5)
nRBC: 0 % (ref 0.0–0.2)

## 2022-02-25 LAB — URINE CULTURE: Culture: NO GROWTH

## 2022-02-25 NOTE — Plan of Care (Signed)
Goals downgraded and/or discontinued due to slower than anticipated progress   Problem: RH Expression Communication Goal: LTG Patient will increase word finding of common (SLP) Description: LTG:  Patient will increase word finding of common objects/daily info/abstract thoughts with cues using compensatory strategies (SLP). Outcome: Not Applicable   Problem: RH Swallowing Goal: LTG Patient will consume least restrictive diet using compensatory strategies with assistance (SLP) Description: LTG:  Patient will consume least restrictive diet using compensatory strategies with assistance (SLP) Flowsheets (Taken 02/25/2022 0851) LTG: Pt Patient will consume least restrictive diet using compensatory strategies with assistance of (SLP): Moderate Assistance - Patient 50 - 74% Goal: LTG Patient will participate in dysphagia therapy to increase swallow function with assistance (SLP) Description: LTG:  Patient will participate in dysphagia therapy to increase swallow function with assistance (SLP) Flowsheets (Taken 02/25/2022 0851) LTG: Pt will participate in dysphagia therapy to increase swallow function with assistance of (SLP): Moderate Assistance - Patient 50 - 74%   Problem: RH Comprehension Communication Goal: LTG Patient will comprehend basic/complex auditory (SLP) Description: LTG: Patient will comprehend basic/complex auditory information with cues (SLP). Flowsheets (Taken 02/25/2022 0851) LTG: Patient will comprehend auditory information with cueing (SLP): Moderate Assistance - Patient 50 - 74%   Problem: RH Expression Communication Goal: LTG Patient will express needs/wants via multi-modal(SLP) Description: LTG:  Patient will express needs/wants via multi-modal communication (gestures/written, etc) with cues (SLP) Flowsheets (Taken 02/25/2022 0851) LTG: Patient will express needs/wants via multimodal communication (gestures/written, etc) with cueing (SLP):  Maximal Assistance - Patient 25 - 49%   Moderate Assistance - Patient 50 - 74%

## 2022-02-25 NOTE — Progress Notes (Signed)
Reached out to Dr. Earlene Plater who recommended monitoring as patient asymptomatic for Covid and respiratory status stable--to hold off on antibiotics as likely chemical pneumonitis.

## 2022-02-25 NOTE — Progress Notes (Signed)
Occupational Therapy Session Note  Patient Details  Name: Paul Bradshaw MRN: [id number] Date of Birth: 01-Apr-1926  Today's Date: 02/25/2022 OT Individual Time: 1015-1045 OT Individual Time Calculation (min): 30 min    Short Term Goals: Week 1:  OT Short Term Goal 1 (Week 1): Pt will be able to sit to stand to prep for LB dressing/ toileting with min A. OT Short Term Goal 2 (Week 1): Pt will be able to stand pivot to [facility] with mod A. OT Short Term Goal 3 (Week 1): Pt will be able to stand upright with min A to increase safety with standing during clothing management. OT Short Term Goal 4 (Week 1): Pt will don shirt with mod A. OT Short Term Goal 5 (Week 1): Pt will demonstrate awareness of RUE by moving R arm into position with LUE when cued.  Skilled Therapeutic Interventions/Progress Updates:    Pt received in bed and needed significant cues to engage in therapy as pt seemed almost "resistant" at times pushing back vs allowing me to help mobilize him.    Removed his hand splint, the straps had been applied tightly so pt had increased edema. Retrograde massage to hand.   Pt had a soiled wet brief on so had pt move to [facility]. Max A overall to get to EOB , pivot to [facility].  Once on [facility] pt refused to stand up despite my explanations that we had to remove his brief. Pt not participating, so had to use the stedy with a heavy MAX A and max cues to get pt to stand. Once brief removed he did have a BM on the toilet. Total A with cleansing and donning brief.    Bathed pt with max A and donned clean gown. Informed RN about BM.  Pt returned to bed.  Positioned arm on pillow. Bed alarm set and all needs met.    Therapy Documentation Precautions:  Precautions Precautions: Fall Precaution Comments: R hemipareisis, expressive>receptive aphasia Restrictions Weight Bearing Restrictions: No  Pain: Pain Assessment Pain Scale: 0-10 Pain Score: 0-No pain        Therapy/Group: Individual  Therapy  [place] 02/25/2022, 11:39 AM

## 2022-02-25 NOTE — Progress Notes (Signed)
Physical Therapy Session Note  Patient Details  Name: Paul Bradshaw MRN: [id number] Date of Birth: [birth year]  Today's Date: 02/25/2022 PT Individual Time: 6283-6629 PT Individual Time Calculation (min): 33 min  and Today's Date: 02/25/2022 PT Missed Time: 12 Minutes Missed Time Reason: Patient fatigue  Short Term Goals: Week 1:  PT Short Term Goal 1 (Week 1): Pt will perform bed mobility with overall CGA and using no bed features. PT Short Term Goal 2 (Week 1): Pt will perform all functional transfers with light MinA/ CGA and LRAD. PT Short Term Goal 3 (Week 1): Pt will ambulate at least 40 ft using LRAD with CGA/ MinA. PT Short Term Goal 4 (Week 1): Pt will initiate stair training. PT Short Term Goal 5 (Week 1): Pt will perform Berg Balance test.  Skilled Therapeutic Interventions/Progress Updates:  Patient supine in bed and asleep on entrance to room. Patient requires time to become fully alert and agreeable to PT session.   Patient with minimal pain complaint at start of session. No indication of R hand pain.   Therapeutic Activity: Bed Mobility: Pt performed supine --> sit to L side of bed with MaxA. Requires Min/ ModA to maintain seated balance initially and then improves to CGA. VC/ tc required for technique throughout. Transfers: Pt performed sit<>stand transfer from EOB to STEDY with MaxA for effort to rise to stand. Provided verbal cues for***.  Therapeutic Exercise: Pt assisted with AAROM to RLE while in supine.   Patient *** at end of session with brakes locked, *** alarm set, and all needs within reach.   Therapy Documentation Precautions:  Precautions Precautions: Fall Precaution Comments: R hemipareisis, expressive>receptive aphasia Restrictions Weight Bearing Restrictions: No General:   Vital Signs: Therapy Vitals Temp: 98.5 F (36.9 C) Temp Source: Oral Pulse Rate: 80 Resp: 20 BP: 128/66 Patient Position (if appropriate): Lying Oxygen Therapy SpO2: 99  % O2 Device: Room Air Pain:  PT with no indication of pain throughout session.    Therapy/Group: Individual Therapy  Loel Dubonnet 02/25/2022, 2:23 PM

## 2022-02-25 NOTE — Progress Notes (Addendum)
Speech Language Pathology Weekly Progress and Session Note  Patient Details  Name: Paul Bradshaw MRN: [id number] Date of Birth: [birth year]  Beginning of progress report period: February 18, 2022 End of progress report period: February 25, 2022  Today's Date: 02/25/2022 SLP Individual Time: 0805-0900 SLP Individual Time Calculation (min): 55 min  Short Term Goals: Week 1: SLP Short Term Goal 1 (Week 1): Patient will participate in therapeutic PO trials with minimal overt s/sx of aspiration with mod A verbal cues for implementation of swallowing safety SLP Short Term Goal 1 - Progress (Week 1): Met SLP Short Term Goal 2 (Week 1): Patient will respond to biographical and environmental yes/no questions with 75% accuracy given moderate A verbal cues SLP Short Term Goal 2 - Progress (Week 1): Not met SLP Short Term Goal 3 (Week 1): Patient will follow one-step commands with 50% accuracy given max A multimodal cues SLP Short Term Goal 3 - Progress (Week 1): Met SLP Short Term Goal 4 (Week 1): Patient will identify field of 2 objects with 25% accuracy given max A multimodal cues SLP Short Term Goal 4 - Progress (Week 1): Met SLP Short Term Goal 5 (Week 1): Pt will vocalize during 10% of opportunities during speech/language tasks with max A multimodal cues SLP Short Term Goal 5 - Progress (Week 1): Met SLP Short Term Goal 6 (Week 1): Pt will communicate functional needs through multimodal means with max A multimodal cues SLP Short Term Goal 6 - Progress (Week 1): Met  New Short Term Goals: Week 2: SLP Short Term Goal 1 (Week 2): STG=LTG due to ELOS  Weekly Progress Updates: Pt has demonstrated overall slow gains, and has met 5 out of 6 short-term goals this reporting period. Pt has been limited by fatigue and severity of deficits. Pt continues to present with severe aphasia, with expressive deficits more impaired than receptive. Pt is currently communicating functional needs through multimodal means,  primarily through gestures and responded to yes/no questions. Verbal communication continues to be severely limited. Pt is minimally stimulable for low tech communication supports. Cannot rule out oral apraxia. Pt is currently consuming dysphagia 1 textures and honey thick liquids and participating in PO trials for possible diet advancement. Pt and family education ongoing re: diet recommendations, supportive communication interventions, and ST POC. Long-term goals were modified due to slower than anticipated progress. Continue to recommend ST intervention during CIR admission, as well as ST f/u upon d/c in home health setting vs. SNF.    Intensity: Minumum of 1-2 x/day, 30 to 90 minutes Frequency: 3 to 5 out of 7 days Duration/Length of Stay: 9/15 Treatment/Interventions: Cognitive remediation/compensation;Cueing hierarchy;Dysphagia/aspiration precaution training;Functional tasks;Internal/external aids;Multimodal communication approach;Patient/family education;Speech/Language facilitation;Therapeutic Activities  Daily Session Skilled Therapeutic Interventions: Skilled ST treatment focused on dysphagia and language goals. Upon arrival, pt was semi reclined and awake in bed. Pt was inconsistently participative and appeared drowsy throughout session. Pt with minimal multimodal communication this date, requiring max A multimodal cues to respond to yes/no questions through gestures, and to follow basic 1-step directions. Pt with no vocalizations throughout session today, again appearing attributed to fatigue.  SLP facilitated therapeutic PO trials with nectar thick liquids and dysphagia 2 textures (separately) with mod A verbal cues for adherence to swallow safety. First, pt consumed honey thick liquids with no overt s/sx of aspiration. Due to minimal vocalization, SLP was unable to assess vocal quality with all trials. Pt then consumed cup sips of nectar thick liquids x2 which resulted in immediate  cough  response during second trial. Pt continued to clear throat for subsequent 5+ minutes. Once resolved, pt consumed dysphagia 2 texture trials with small graham cracker. Pt exhibited difficulty accepting cracker and was observed sucking on the cracker rather than taking a small bite. Once pt orally received the cracker with max A cues, pt exhibited prolonged and ineffective mastication and oral prep with munch chew pattern, bolus was eventually suctioned from oral cavity with yankauer, and further trials were halted due to drowsiness; suspect drowsiness impacted tolerance to today's trials. Considering clinical presentation this date, continue to recommend dysphagia 1 diet with honey thick liquids. Will continue trials as tolerated. Pt does not yet appear appropriate for repeat instrumental swallow assessment due to ongoing s/sx of aspiration with NTL trials. Will continue to monitor during dysphagia treatment.   Pt performed oral care with suction toothbrush with max A for thoroughness, and total A for upper denture care. Patient was left in bed with alarm activated and immediate needs within reach at end of session. Continue per current plan of care.      General    Pain  No pain behaviors noted throughout session  Therapy/Group: Individual Therapy  Patty Sermons 02/25/2022, 8:51 AM

## 2022-02-25 NOTE — Progress Notes (Signed)
PROGRESS NOTE   Subjective/Complaints:  Had temp x 1 to 100.52F  ROS: limited due to language/communication   Objective:   DG CHEST PORT 1 VIEW  Result Date: 02/24/2022 CLINICAL DATA:  Fever EXAM: PORTABLE CHEST 1 VIEW COMPARISON:  02/09/2022 FINDINGS: Transverse diameter of heart is increased. There are no signs of pulmonary edema or focal pulmonary consolidation. There is no pleural effusion or pneumothorax. There is moderate sized fixed hiatal hernia. IMPRESSION: No focal pulmonary infiltrates are seen.  Fixed hiatal hernia. Electronically Signed   By: Ernie Avena M.D.   On: 02/24/2022 12:46   Recent Labs    02/24/22 0515 02/25/22 0638  WBC 15.8* 17.6*  HGB 13.1 11.5*  HCT 39.3 34.5*  PLT 358 333     Recent Labs    02/25/22 0638  NA 134*  K 4.2  CL 97*  CO2 26  GLUCOSE 117*  BUN 24*  CREATININE 1.01  CALCIUM 8.5*     Intake/Output Summary (Last 24 hours) at 02/25/2022 2263 Last data filed at 02/25/2022 0700 Gross per 24 hour  Intake 712 ml  Output --  Net 712 ml      Pressure Injury 02/07/22 Perineum Medial Stage 1 -  Intact skin with non-blanchable redness of a localized area usually over a bony prominence. 2 1/2in by 1 inch redness, non blanching (Active)  02/07/22 1327  Location: Perineum  Location Orientation: Medial  Staging: Stage 1 -  Intact skin with non-blanchable redness of a localized area usually over a bony prominence.  Wound Description (Comments): 2 1/2in by 1 inch redness, non blanching  Present on Admission: Yes    Physical Exam: Vital Signs Blood pressure (!) 146/60, pulse 90, temperature 98.7 F (37.1 C), temperature source Oral, resp. rate 18, height 5\' 5"  (1.651 m), weight 64.4 kg, SpO2 100 %.  General: No acute distress Mood and affect are appropriate Heart: Regular rate and rhythm no rubs murmurs or extra sounds Lungs: Clear to auscultation, breathing unlabored, no  rales or wheezes Abdomen: Positive bowel sounds, soft nontender to palpation, nondistended Extremities: No clubbing, cyanosis, or edema Skin: No evidence of breakdown, no evidence of rash   Neurologic: Awake, alert, does not respond to orientation quesitons. Does nod Y/N. Moving BL LE and LUE in bed. Flaccid RUE. Does not follow commands for strength testing consistently.  Responsive and localizing to pain except in RUE.      Assessment/Plan: 1. Functional deficits which require 3+ hours per day of interdisciplinary therapy in a comprehensive inpatient rehab setting. Physiatrist is providing close team supervision and 24 hour management of active medical problems listed below. Physiatrist and rehab team continue to assess barriers to discharge/monitor patient progress toward functional and medical goals  Care Tool:  Bathing    Body parts bathed by patient: Chest, Face, Abdomen, Right upper leg, Left upper leg   Body parts bathed by helper: Right arm, Left arm, Buttocks, Front perineal area, Right lower leg, Left lower leg     Bathing assist Assist Level: Moderate Assistance - Patient 50 - 74%     Upper Body Dressing/Undressing Upper body dressing   What is the patient wearing?: Pull over  shirt    Upper body assist Assist Level: Moderate Assistance - Patient 50 - 74%    Lower Body Dressing/Undressing Lower body dressing      What is the patient wearing?: Incontinence brief, Pants     Lower body assist Assist for lower body dressing: Maximal Assistance - Patient 25 - 49%     Toileting Toileting    Toileting assist Assist for toileting: Total Assistance - Patient < 25%     Transfers Chair/bed transfer  Transfers assist  Chair/bed transfer activity did not occur: Safety/medical concerns  Chair/bed transfer assist level: Moderate Assistance - Patient 50 - 74%     Locomotion Ambulation   Ambulation assist   Ambulation activity did not occur: Safety/medical  concerns          Walk 10 feet activity   Assist  Walk 10 feet activity did not occur: Safety/medical concerns        Walk 50 feet activity   Assist Walk 50 feet with 2 turns activity did not occur: Safety/medical concerns         Walk 150 feet activity   Assist Walk 150 feet activity did not occur: Safety/medical concerns         Walk 10 feet on uneven surface  activity   Assist Walk 10 feet on uneven surfaces activity did not occur: Safety/medical concerns         Wheelchair     Assist Is the patient using a wheelchair?: Yes (Did not use one PTA) Type of Wheelchair: Manual Wheelchair activity did not occur: Safety/medical concerns         Wheelchair 50 feet with 2 turns activity    Assist    Wheelchair 50 feet with 2 turns activity did not occur: Safety/medical concerns       Wheelchair 150 feet activity     Assist  Wheelchair 150 feet activity did not occur: Safety/medical concerns       Blood pressure (!) 146/60, pulse 90, temperature 98.7 F (37.1 C), temperature source Oral, resp. rate 18, height 5\' 5"  (1.651 m), weight 64.4 kg, SpO2 100 %.  Medical Problem List and Plan: 1. 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 Extension - check repeat CT head             -patient may shower             -ELOS/Goals: 9/15  -Continue CIR therapies including PT, OT, and SLP              -WHO for RUE 2.  Antithrombotics: -DVT/anticoagulation:  Pharmaceutical: Lovenox             -antiplatelet therapy: DAPT X 3 months followed by ASA alone.  3. Pain Management: Tylenol prn.  4. Mood/Behavior/Sleep: LCSW to follow for evaluation and support.              -antipsychotic agents: N/A 5. Neuropsych/cognition: This patient is not fully capable of making decisions on his own behalf due to aphasia. 6. Skin/Wound Care: Routine pressure relief measures.   7. Fluids/Electrolytes/Nutrition: Monitor I/O. Check CMET in am for follow up on AKI/LFTs.         HypoK+ supplement and recheck K+ - WNL    Latest Ref Rng & Units 02/25/2022    6:38 AM 02/21/2022    7:54 AM 02/18/2022    5:05 AM  BMP  Glucose 70 -  99 mg/dL 644   034   BUN 8 - 23 mg/dL 24   26   Creatinine 7.42 - 1.24 mg/dL 5.95   6.38   Sodium 756 - 145 mmol/L 134   142   Potassium 3.5 - 5.1 mmol/L 4.2  4.2  3.2   Chloride 98 - 111 mmol/L 97   106   CO2 22 - 32 mmol/L 26   28   Calcium 8.9 - 10.3 mg/dL 8.5   8.5     HypoK+ will supplement , not on diuretics but had several stools on 8/27  9/2- last K+ 4.2- doing well 8. L-MCA infarct with hemorrhagic conversion: DAPT X 90 day followed by ASA alone 9. HTN: Monitor BP TID. BP remains labile. Resume Proscar.  --Continue Metoprolol--was titrated upwards 08/27 for better control.  9/4- BP controlled- con't regimen Vitals:   02/24/22 1958 02/25/22 0452  BP: 121/63 (!) 146/60  Pulse: 95 90  Resp: 17 18  Temp: 98.2 F (36.8 C) 98.7 F (37.1 C)  SpO2: 94% 100%    10. Dysphagia: Continue D1, honey thick liquids. Needs assistance for feeding and supervision for safety.  --hypernatremia/AKI likely due to dysphagia diet 11. BPH: Monitor for any voiding difficulties. Will order PVR checks as off his meds.  --Proscar was not resumed. Flomax was d/c on 08/20.  12. Pre-renal azotemia: Offer fluids between meals.  --Indicated thirst -->drank honey liquids without negative expression.  -supplement with IVF if needed 9/2- will recheck Monday and see if doing better- last BUN 26- improving 9/4 no BMET ordered for today--check BMET 9/5 13. Covid + asymptomatic,WBC nl.  monitor for cough fever, sore throat, no antiviral unless symptomatic, maintain isolation in room 5-7 d per ID, N95 masking in room  --continue to monitor for any signs of infection/aspiration.     Latest Ref Rng & Units 02/25/2022    6:38 AM 02/24/2022    5:15 AM 02/18/2022    5:05  AM  CBC  WBC 4.0 - 10.5 K/uL 17.6  15.8  8.0   Hemoglobin 13.0 - 17.0 g/dL 43.3  29.5  18.8   Hematocrit 39.0 - 52.0 % 34.5  39.3  38.0   Platelets 150 - 400 K/uL 333  358  220    9/4 WBC's up to 15.8k today---he's had low grade temp -exam unchanged except for increased Left gaze preference  Normal cxr and ua, ucx  -f/u CBC 9/5-WBCs up to 17.6K will consult ID   LOS: 8 days A FACE TO FACE EVALUATION WAS PERFORMED  Erick Colace 02/25/2022, 9:39 AM

## 2022-02-26 ENCOUNTER — Other Ambulatory Visit: Payer: Self-pay | Admitting: Family Medicine

## 2022-02-26 DIAGNOSIS — N401 Enlarged prostate with lower urinary tract symptoms: Secondary | ICD-10-CM

## 2022-02-26 LAB — CBC WITH DIFFERENTIAL/PLATELET
Abs Immature Granulocytes: 0.21 10*3/uL — ABNORMAL HIGH (ref 0.00–0.07)
Basophils Absolute: 0 10*3/uL (ref 0.0–0.1)
Basophils Relative: 0 %
Eosinophils Absolute: 0 10*3/uL (ref 0.0–0.5)
Eosinophils Relative: 0 %
HCT: 34.4 % — ABNORMAL LOW (ref 39.0–52.0)
Hemoglobin: 11.3 g/dL — ABNORMAL LOW (ref 13.0–17.0)
Immature Granulocytes: 2 %
Lymphocytes Relative: 12 %
Lymphs Abs: 1.4 10*3/uL (ref 0.7–4.0)
MCH: 32.1 pg (ref 26.0–34.0)
MCHC: 32.8 g/dL (ref 30.0–36.0)
MCV: 97.7 fL (ref 80.0–100.0)
Monocytes Absolute: 1.1 10*3/uL — ABNORMAL HIGH (ref 0.1–1.0)
Monocytes Relative: 10 %
Neutro Abs: 8.3 10*3/uL — ABNORMAL HIGH (ref 1.7–7.7)
Neutrophils Relative %: 76 %
Platelets: 340 10*3/uL (ref 150–400)
RBC: 3.52 MIL/uL — ABNORMAL LOW (ref 4.22–5.81)
RDW: 11.6 % (ref 11.5–15.5)
WBC: 11.1 10*3/uL — ABNORMAL HIGH (ref 4.0–10.5)
nRBC: 0 % (ref 0.0–0.2)

## 2022-02-26 NOTE — Patient Care Conference (Signed)
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 Claudette Laws Social Worker Present: Lavera Guise, BSW Nurse Present: Chana Bode, RN PT Present: Grier Rocher, PT OT Present: Primitivo Gauze, OT SLP Present: Eilene Ghazi, SLP PPS Coordinator present : Fae Pippin, SLP     Current Status/Progress Goal Weekly Team Focus  Bowel/Bladder   Pt is incontinent. LBM: 9/5  Regain continence of B/B  Assist w/ time toileting q shift.   Swallow/Nutrition/ Hydration   dys 1 diet, honey thick liquids  mod A  diet tolerance, dys 2/NTL trials   ADL's   max - total A overall, increased RUE pain, decreased initiation/attention  mod  A overall (goals downgraded)  ADL training, RUE NMR, R visual scanning, postural control, pt/fam education   Mobility   Still under airborne/ contact precautions with no visitation from family, has decreased in mobility efforts since eval. fatigued.  Bed mobility = Mod/ MaxA, Transfers = MaxA, no ambulation d/t fatigue and lethargy  overall CGA/ MinA - to be downgraded  will need family visitation to see if he improves in motivation/ participation and dtermine family's LOA, continued NMR for R hemibody, motor planning, proprioception, balance, and understanding;  improving LOA with transfers, gait training, stair training if possible, family education   Communication   max-to-total A multimodal communication, mod-to-max A comprehension  mod A basic auditory comprehension, max A expression through multimodal means  multimodal communication, yes/no responses, gestural communication, following 1-step directions, vocalizations   Safety/Cognition/ Behavioral Observations            Pain   No c/o pain  Remain pain free  Assess pain q shift   Skin   Skin intact; stage 1 on sacrum - covered w/ a foam dressing; Bruising to bilat. arms & abd  Skin to remain intact; No new skin breakdown  Assess skin q shift     Discharge Planning:  Patient discharging home with son, Casimiro Needle and his friend, [name].   Team Discussion: Patient with COVID post left MCA CVA. Remains incontinent of bowel and bladder; can be continent with toileting. Progress limited by lethargy, little eye contact, behavior, etc. Note increased participation to day and more verbalizations. Function limited by right upper extremity pain with activity and tone/edema of extremity.  Patient on target to meet rehab goals: no, currently needs mod assist for bed mobility and transfers. Requires assist to balance at EOB. Inconsistent function from min - mod - max. Needs max assist for multi modal communication, follow 1 step instructions/commands  *See Care Plan and progress notes for long and short-term goals.   Revisions to Treatment Plan:  Goals downgraded due to limited gains to mod assist overall WHO for right UE D2 trials at bedside w SLP   Teaching Needs: Safety, medications, dietary modifications,  transfers, toileting, skin care/pressure relif, etc.  Current Barriers to Discharge: Decreased caregiver support, Home enviroment access/layout, and Incontinence  Possible Resolutions to Barriers: Family education HH follow up services DME: [facility] for community     Medical Summary Current  Status: still on Covid prec, Repeat CT head neg, severe aphasia , weakness RUE > RLE  Barriers to Discharge: Medical stability;Other (comments)  Barriers to Discharge Comments: severe aphasia Possible Resolutions to [employer] Focus: hope to d/c covid prec at 10d,will need family ed after isolation is discontinued   Continued Need for Acute Rehabilitation Level of Care: The patient requires daily medical management by a physician with specialized training in physical medicine and rehabilitation for the following reasons: Direction of a multidisciplinary physical rehabilitation program to maximize functional independence : Yes Medical management of patient stability for increased activity during participation in an intensive rehabilitation regime.: Yes Analysis of laboratory values and/or radiology reports with any subsequent need for medication adjustment and/or medical intervention. : Yes   I attest that I was present, lead the team conference, and concur with the assessment and plan of the team.   Chana Bode B 02/26/2022, 1:12 PM

## 2022-02-26 NOTE — Progress Notes (Signed)
Speech Language Pathology Daily Session Note  Patient Details  Name: Paul Bradshaw MRN: [id number] Date of Birth: 12-21-1925  Today's Date: 02/26/2022 SLP Individual Time: 1304-1400 SLP Individual Time Calculation (min): 56 min  Short Term Goals: Week 2: SLP Short Term Goal 1 (Week 2): STG=LTG due to ELOS  Skilled Therapeutic Interventions: Skilled ST treatment focused on dysphagia and language goals. Pt was accompanied by son who was receptive to education on aphasia, communication strategies, dysphagia, diet recommendations, and safe swallowing precautions and strategies. Son plans to come back for additional education on Saturday, 03/01/22.   SLP facilitated therapeutic PO trials with dysphagia 2 textures with prolonged and incomplete mastication, mild-to-moderat oral residuals post swallows, mild buccal stasis on right, and delayed throat clear x1. Following ~10 minute delay, pt consumed nectar thick liquids by spoon without overt s/sx of aspiration. Pt then consumed by cup with mild-to-moderate anterior spillage on right, wet vocal quality per cued voicing attempt, and immediate cough response x1 of 5 trials. Continue to recommend current diet at this time with continued trials with SLP only. Son verbalized understanding with recommendations through teach back.   SLP facilitated vocalizations through automatic speech tasks including counting 1-10 with max A multimodal cues to elicit unintelligible vocalizations, singing "happy birthday" song with max A multimodal cues to elicit "you" following cloze phrase prompt and expectant look, and singing "amazing grace" song with max A multimodal cues to elicit "see" following cloze phrase prompt and expectant look. Pt was much more interactive and vocal this date, however limited by unintelligible vocalizations and low vocal intensity. Pt maintained eye contact with speech therapist during most communication attempts. Less eye contact was established with  son, however this was suspected to attribute to son sitting further away from patient, as well as pt's hearing loss.  Patient was left in wheelchair with alarm activated and immediate needs within reach at end of session. Continue per current plan of care.       Pain  No pain behaviors observed  Therapy/Group: Individual Therapy  Tamala Ser 02/26/2022, 3:40 PM

## 2022-02-26 NOTE — Progress Notes (Signed)
Physical Therapy Session Note  Patient Details  Name: Paul Bradshaw MRN: [id number] Date of Birth: 03/26/1926  Today's Date: 02/26/2022 PT Individual Time: 1120-1200 PT Individual Time Calculation (min): 40 min   Short Term Goals: Week 1:  PT Short Term Goal 1 (Week 1): Pt will perform bed mobility with overall CGA and using no bed features. PT Short Term Goal 2 (Week 1): Pt will perform all functional transfers with light MinA/ CGA and LRAD. PT Short Term Goal 3 (Week 1): Pt will ambulate at least 40 ft using LRAD with CGA/ MinA. PT Short Term Goal 4 (Week 1): Pt will initiate stair training. PT Short Term Goal 5 (Week 1): Pt will perform Berg Balance test.   Skilled Therapeutic Interventions/Progress Updates:   Pt received sitting in WC and agreeable to PT. Pt performed sit<>stand from WC, BSC and arm chair with Min-mod assist throughout session with RW and hand splint. Pt performed gait training in room x 41ft with mod assist to sink. Standing balance at sink to attempt reaching task, but unable to follow instruction fromPT. Pt noted to have gas, nodding yes to need for BM. Sit<>stand onto toilet with mod assist and max assist for clothing management and pericare. Additional gait training with RW in room to WC x 77ft with mod assist for weight shift and AD management. Left sitting in [facility] with call bell left in reach and family present with all appropriate PPE donned.       Therapy Documentation Precautions:  Precautions Precautions: Fall Precaution Comments: R hemipareisis, expressive>receptive aphasia Restrictions Weight Bearing Restrictions: No  Pain: Pain Assessment Pain Scale: Faces Faces Pain Scale: Hurts a little bit Pain Intervention(s): Medication (See eMAR)   Therapy/Group: Individual Therapy  Golden Pop 02/26/2022, 12:13 PM

## 2022-02-26 NOTE — Progress Notes (Signed)
Occupational Therapy Weekly Progress Note  Patient Details  Name: Paul Bradshaw MRN: [id number] Date of Birth: 02/04/1926  Beginning of progress report period: February 18, 2022 End of progress report period: February 26, 2022  Today's Date: 02/26/2022 OT Individual Time: 7106-2694 OT Individual Time Calculation (min): 43 min    Patient has met 0 of 5 short term goals.  Pt needed max A on admission and is now requiring max to total A.  (ALTHOUGH TODAY HE DID DEMONSTRATE IMPROVED MOVEMENT AND INITIATION AND ABLE TO DO MORE TASKS).  He has new RUE pain and grimaces at light touch to arm.  His initiation is less than the first few days of therapy and pt is overall participating less.   Patient continues to demonstrate the following deficits: muscle weakness and muscle joint tightness, decreased cardiorespiratoy endurance, abnormal tone and unbalanced muscle activation, decreased visual acuity, decreased visual perceptual skills, and decreased visual motor skills, decreased midline orientation and decreased attention to right, decreased initiation, decreased attention, decreased awareness, decreased problem solving, decreased safety awareness, decreased memory, and delayed processing, and decreased sitting balance, decreased standing balance, decreased postural control, and hemiplegia and therefore will continue to benefit from skilled OT intervention to enhance overall performance with BADL.  Patient not progressing toward long term goals.  See goal revision..  Plan of care revisions:  LTGs downgraded due to limited participation, decreased intiation, RUE pain. LTGs were set at min A, downgraded to mod to max A. Problem: RH Balance Goal: LTG: Patient will maintain dynamic sitting balance (OT) Description: LTG:  Patient will maintain dynamic sitting balance with assistance during activities of daily living (OT) Flowsheets (Taken 02/26/2022 1228) LTG: Pt will maintain dynamic sitting balance during ADLs  with: (LTG downgraded due to limited progress.) Minimal Assistance - Patient > 75% Note: LTG downgraded due to limited progress. Goal: LTG Patient will maintain dynamic standing with ADLs (OT) Description: LTG:  Patient will maintain dynamic standing balance with assist during activities of daily living (OT)  Flowsheets (Taken 02/26/2022 1228) LTG: Pt will maintain dynamic standing balance during ADLs with: (LTG downgraded due to limited progress.) Moderate Assistance - Patient 50 - 74% Note: LTG downgraded due to limited progress.   Problem: Sit to Stand Goal: LTG:  Patient will perform sit to stand in prep for activites of daily living with assistance level (OT) Description: LTG:  Patient will perform sit to stand in prep for activites of daily living with assistance level (OT) Flowsheets (Taken 02/26/2022 1228) LTG: PT will perform sit to stand in prep for activites of daily living with assistance level: (LTG downgraded due to limited progress.) Minimal Assistance - Patient > 75% Note: LTG downgraded due to limited progress.   Problem: RH Bathing Goal: LTG Patient will bathe all body parts with assist levels (OT) Description: LTG: Patient will bathe all body parts with assist levels (OT) Flowsheets (Taken 02/26/2022 1228) LTG: Pt will perform bathing with assistance level/cueing: (LTG downgraded due to limited progress.) Moderate Assistance - Patient 50 - 74% Note: LTG downgraded due to limited progress.   Problem: RH Dressing Goal: LTG Patient will perform upper body dressing (OT) Description: LTG Patient will perform upper body dressing with assist, with/without cues (OT). Flowsheets (Taken 02/26/2022 1228) LTG: Pt will perform upper body dressing with assistance level of: (LTG downgraded due to limited progress.) Minimal Assistance - Patient > 75% Note: LTG downgraded due to limited progress. Goal: LTG Patient will perform lower body dressing w/assist (OT) Description: LTG: Patient  will  perform lower body dressing with assist, with/without cues in positioning using equipment (OT) Flowsheets (Taken 02/26/2022 1228) LTG: Pt will perform lower body dressing with assistance level of: (LTG downgraded due to limited progress.) Maximal Assistance - Patient 25 - 49% Note: LTG downgraded due to limited progress.   Problem: RH Toileting Goal: LTG Patient will perform toileting task (3/3 steps) with assistance level (OT) Description: LTG: Patient will perform toileting task (3/3 steps) with assistance level (OT)  Flowsheets (Taken 02/26/2022 1228) LTG: Pt will perform toileting task (3/3 steps) with assistance level: (LTG downgraded due to limited progress.) Maximal Assistance - Patient 25 - 49% Note: LTG downgraded due to limited progress.   Problem: RH Functional Use of Upper Extremity Goal: LTG Patient will use RT/LT upper extremity as a (OT) Description: LTG: Patient will use right/left upper extremity as a stabilizer/gross assist/diminished/nondominant/dominant level with assist, with/without cues during functional activity (OT) Flowsheets (Taken 02/26/2022 1228) LTG: Pt will use upper extremity in functional activity with assistance level of: (LTG downgraded due to limited progress.) Minimal Assistance - Patient > 75% Note: LTG downgraded due to limited progress.   Problem: RH Tub/Shower Transfers Goal: LTG Patient will perform tub/shower transfers w/assist (OT) Description: LTG: Patient will perform tub/shower transfers with assist, with/without cues using equipment (OT) Flowsheets (Taken 02/26/2022 1228) LTG: Pt will perform tub/shower stall transfers with assistance level of: (LTG downgraded due to limited progress.) Moderate Assistance - Patient 50 - 74% Note: LTG downgraded due to limited progress.    OT Short Term Goals Week 1:  OT Short Term Goal 1 (Week 1): Pt will be able to sit to stand to prep for LB dressing/ toileting with min A. OT Short Term Goal 1 - Progress (Week  1): Not progressing OT Short Term Goal 2 (Week 1): Pt will be able to stand pivot to [facility] with mod A. OT Short Term Goal 2 - Progress (Week 1): Not progressing OT Short Term Goal 3 (Week 1): Pt will be able to stand upright with min A to increase safety with standing during clothing management. OT Short Term Goal 3 - Progress (Week 1): Not progressing OT Short Term Goal 4 (Week 1): Pt will don shirt with mod A. OT Short Term Goal 4 - Progress (Week 1): Not progressing OT Short Term Goal 5 (Week 1): Pt will demonstrate awareness of RUE by moving R arm into position with LUE when cued. OT Short Term Goal 5 - Progress (Week 1): Not progressing Week 2:  OT Short Term Goal 1 (Week 2): Pt will be able to sit to EOB with mod A to prep for self care. OT Short Term Goal 2 (Week 2): Pt will be able to sit to stand from EOB with mod A to prep transfer. OT Short Term Goal 3 (Week 2): Pt will tolerate standing with mod A to enable caregives to A him with pericare post toileting. OT Short Term Goal 4 (Week 2): Pt will transfer to [facility] with mod A.  Skilled Therapeutic Interventions/Progress Updates:    Pt received in bed and more alert today.  Overall he had much improved participation today then he has had since this weekend.  Pt making good eye contact, followed 1 step directions, and was able to stand up more effectively. Pt worked on bed mobility with mod A, sit to stand and stand pivot to [facility] mod A, his brief was clean and then he voided on the [facility]. Pt sat on BSC to  bathe and dress with max A. Pt continues to have RUE pain but did allow me to engage his arm in hand over hand guiding tasks to facilitate movement with self care.  Once covid restrictions are lifted, it will be beneficial to have his family come in to participate in therapy.  Pt seated in wc . Hand off to nurse techs to finish getting pt set up.    Therapy Documentation Precautions:  Precautions Precautions: Fall Precaution Comments: R  hemipareisis, expressive>receptive aphasia Restrictions Weight Bearing Restrictions: No    Pain: Pain Assessment Pain Scale: Faces Faces Pain Scale: Hurts a little bit Pain Intervention(s): Medication (See eMAR) ADL: ADL Eating: Maximal assistance Grooming: Moderate cueing, Supervision/safety Where Assessed-Grooming: Edge of bed Upper Body Bathing: Moderate assistance Where Assessed-Upper Body Bathing: Other (Comment) (sitting on BSC) Lower Body Bathing: Moderate assistance Where Assessed-Lower Body Bathing: Other (Comment) (sitting on BSC) Upper Body Dressing: Moderate assistance Where Assessed-Upper Body Dressing: Other (Comment) (sitting on BSC) Lower Body Dressing: Maximal assistance Where Assessed-Lower Body Dressing: Other (Comment) (sitting on BSC) Toileting: Dependent (continent today) Where Assessed-Toileting: Bedside Commode Toilet Transfer: Moderate assistance Toilet Transfer Method: Stand pivot Toilet Transfer Equipment: Bedside commode   Therapy/Group: Individual Therapy  Kyngston Pickelsimer 02/26/2022, 12:38 PM

## 2022-02-26 NOTE — Plan of Care (Signed)
Problem: RH Balance Goal: LTG: Patient will maintain dynamic sitting balance (OT) Description: LTG:  Patient will maintain dynamic sitting balance with assistance during activities of daily living (OT) Flowsheets (Taken 02/26/2022 1228) LTG: Pt will maintain dynamic sitting balance during ADLs with: (LTG downgraded due to limited progress.) Minimal Assistance - Patient > 75% Note: LTG downgraded due to limited progress. Goal: LTG Patient will maintain dynamic standing with ADLs (OT) Description: LTG:  Patient will maintain dynamic standing balance with assist during activities of daily living (OT)  Flowsheets (Taken 02/26/2022 1228) LTG: Pt will maintain dynamic standing balance during ADLs with: (LTG downgraded due to limited progress.) Moderate Assistance - Patient 50 - 74% Note: LTG downgraded due to limited progress.   Problem: Sit to Stand Goal: LTG:  Patient will perform sit to stand in prep for activites of daily living with assistance level (OT) Description: LTG:  Patient will perform sit to stand in prep for activites of daily living with assistance level (OT) Flowsheets (Taken 02/26/2022 1228) LTG: PT will perform sit to stand in prep for activites of daily living with assistance level: (LTG downgraded due to limited progress.) Minimal Assistance - Patient > 75% Note: LTG downgraded due to limited progress.   Problem: RH Bathing Goal: LTG Patient will bathe all body parts with assist levels (OT) Description: LTG: Patient will bathe all body parts with assist levels (OT) Flowsheets (Taken 02/26/2022 1228) LTG: Pt will perform bathing with assistance level/cueing: (LTG downgraded due to limited progress.) Moderate Assistance - Patient 50 - 74% Note: LTG downgraded due to limited progress.   Problem: RH Dressing Goal: LTG Patient will perform upper body dressing (OT) Description: LTG Patient will perform upper body dressing with assist, with/without cues (OT). Flowsheets (Taken 02/26/2022  1228) LTG: Pt will perform upper body dressing with assistance level of: (LTG downgraded due to limited progress.) Minimal Assistance - Patient > 75% Note: LTG downgraded due to limited progress. Goal: LTG Patient will perform lower body dressing w/assist (OT) Description: LTG: Patient will perform lower body dressing with assist, with/without cues in positioning using equipment (OT) Flowsheets (Taken 02/26/2022 1228) LTG: Pt will perform lower body dressing with assistance level of: (LTG downgraded due to limited progress.) Maximal Assistance - Patient 25 - 49% Note: LTG downgraded due to limited progress.   Problem: RH Toileting Goal: LTG Patient will perform toileting task (3/3 steps) with assistance level (OT) Description: LTG: Patient will perform toileting task (3/3 steps) with assistance level (OT)  Flowsheets (Taken 02/26/2022 1228) LTG: Pt will perform toileting task (3/3 steps) with assistance level: (LTG downgraded due to limited progress.) Maximal Assistance - Patient 25 - 49% Note: LTG downgraded due to limited progress.   Problem: RH Functional Use of Upper Extremity Goal: LTG Patient will use RT/LT upper extremity as a (OT) Description: LTG: Patient will use right/left upper extremity as a stabilizer/gross assist/diminished/nondominant/dominant level with assist, with/without cues during functional activity (OT) Flowsheets (Taken 02/26/2022 1228) LTG: Pt will use upper extremity in functional activity with assistance level of: (LTG downgraded due to limited progress.) Minimal Assistance - Patient > 75% Note: LTG downgraded due to limited progress.   Problem: RH Tub/Shower Transfers Goal: LTG Patient will perform tub/shower transfers w/assist (OT) Description: LTG: Patient will perform tub/shower transfers with assist, with/without cues using equipment (OT) Flowsheets (Taken 02/26/2022 1228) LTG: Pt will perform tub/shower stall transfers with assistance level of: (LTG downgraded due  to limited progress.) Moderate Assistance - Patient 50 - 74% Note: LTG downgraded  due to limited progress.

## 2022-02-26 NOTE — Progress Notes (Signed)
PROGRESS NOTE   Subjective/Complaints: CT head reviewed , no acute findings evolving L MCA infarct Doing better with PT OT LUE pain noted with movement    ROS: limited due to language/communication   Objective:   CT HEAD WO CONTRAST (5MM)  Result Date: 02/25/2022 CLINICAL DATA:  Stroke follow-up. Increased left gave his preference EXAM: CT HEAD WITHOUT CONTRAST TECHNIQUE: Contiguous axial images were obtained from the base of the skull through the vertex without intravenous contrast. RADIATION DOSE REDUCTION: This exam was performed according to the departmental dose-optimization program which includes automated exposure control, adjustment of the mA and/or kV according to patient size and/or use of iterative reconstruction technique. COMPARISON:  Head CT 02/09/2022 FINDINGS: Brain: Interval fogging and early mineralization of left frontal cortically based infarct when compared to prior. No new infarct is seen. No hemorrhage, hydrocephalus, or masslike finding. Brain atrophy and chronic small vessel ischemia. Vascular: No hyperdense vessel. Extensive atheromatous calcification of the intracranial ICA. Skull: Normal. Negative for fracture or focal lesion. Sinuses/Orbits: No acute finding. IMPRESSION: Interval evolution of the recent left MCA branch infarct. No new or acute finding. Electronically Signed   By: Jorje Guild M.D.   On: 02/25/2022 11:31   DG CHEST PORT 1 VIEW  Result Date: 02/24/2022 CLINICAL DATA:  Fever EXAM: PORTABLE CHEST 1 VIEW COMPARISON:  02/09/2022 FINDINGS: Transverse diameter of heart is increased. There are no signs of pulmonary edema or focal pulmonary consolidation. There is no pleural effusion or pneumothorax. There is moderate sized fixed hiatal hernia. IMPRESSION: No focal pulmonary infiltrates are seen.  Fixed hiatal hernia. Electronically Signed   By: Elmer Picker M.D.   On: 02/24/2022 12:46   Recent  Labs    02/25/22 0638 02/26/22 0642  WBC 17.6* 11.1*  HGB 11.5* 11.3*  HCT 34.5* 34.4*  PLT 333 340     Recent Labs    02/25/22 0638  NA 134*  K 4.2  CL 97*  CO2 26  GLUCOSE 117*  BUN 24*  CREATININE 1.01  CALCIUM 8.5*      Intake/Output Summary (Last 24 hours) at 02/26/2022 1013 Last data filed at 02/26/2022 0816 Gross per 24 hour  Intake 336 ml  Output --  Net 336 ml      Pressure Injury 02/07/22 Perineum Medial Stage 1 -  Intact skin with non-blanchable redness of a localized area usually over a bony prominence. 2 1/2in by 1 inch redness, non blanching (Active)  02/07/22 1327  Location: Perineum  Location Orientation: Medial  Staging: Stage 1 -  Intact skin with non-blanchable redness of a localized area usually over a bony prominence.  Wound Description (Comments): 2 1/2in by 1 inch redness, non blanching  Present on Admission: Yes    Physical Exam: Vital Signs Blood pressure (!) 122/56, pulse 77, temperature 98.1 F (36.7 C), temperature source Oral, resp. rate 17, height 5\' 5"  (1.651 m), weight 64.4 kg, SpO2 98 %.  General: No acute distress Mood and affect are appropriate Heart: Regular rate and rhythm no rubs murmurs or extra sounds Lungs: Clear to auscultation, breathing unlabored, no rales or wheezes Abdomen: Positive bowel sounds, soft nontender to palpation, nondistended Extremities: No  clubbing, cyanosis, or edema Skin: No evidence of breakdown, no evidence of rash MSK- Pain endrange right finger and wrist extension   Neurologic: Awake, alert, does not respond to orientation quesitons. Does nod Y/N. Moving BL LE and LUE in bed. Flaccid RUE. Does not follow commands for strength testing consistently.  Responsive and localizing to pain except in RUE.      Assessment/Plan: 1. Functional deficits which require 3+ hours per day of interdisciplinary therapy in a comprehensive inpatient rehab setting. Physiatrist is providing close team supervision  and 24 hour management of active medical problems listed below. Physiatrist and rehab team continue to assess barriers to discharge/monitor patient progress toward functional and medical goals  Care Tool:  Bathing    Body parts bathed by patient: Chest, Face, Abdomen, Right upper leg, Left upper leg   Body parts bathed by helper: Right arm, Left arm, Buttocks, Front perineal area, Right lower leg, Left lower leg     Bathing assist Assist Level: Moderate Assistance - Patient 50 - 74%     Upper Body Dressing/Undressing Upper body dressing   What is the patient wearing?: Pull over shirt    Upper body assist Assist Level: Moderate Assistance - Patient 50 - 74%    Lower Body Dressing/Undressing Lower body dressing      What is the patient wearing?: Incontinence brief, Pants     Lower body assist Assist for lower body dressing: Maximal Assistance - Patient 25 - 49%     Toileting Toileting    Toileting assist Assist for toileting: Total Assistance - Patient < 25%     Transfers Chair/bed transfer  Transfers assist  Chair/bed transfer activity did not occur: Safety/medical concerns  Chair/bed transfer assist level: Moderate Assistance - Patient 50 - 74%     Locomotion Ambulation   Ambulation assist   Ambulation activity did not occur: Safety/medical concerns          Walk 10 feet activity   Assist  Walk 10 feet activity did not occur: Safety/medical concerns        Walk 50 feet activity   Assist Walk 50 feet with 2 turns activity did not occur: Safety/medical concerns         Walk 150 feet activity   Assist Walk 150 feet activity did not occur: Safety/medical concerns         Walk 10 feet on uneven surface  activity   Assist Walk 10 feet on uneven surfaces activity did not occur: Safety/medical concerns         Wheelchair     Assist Is the patient using a wheelchair?: Yes (Did not use one PTA) Type of Wheelchair:  Manual Wheelchair activity did not occur: Safety/medical concerns         Wheelchair 50 feet with 2 turns activity    Assist    Wheelchair 50 feet with 2 turns activity did not occur: Safety/medical concerns       Wheelchair 150 feet activity     Assist  Wheelchair 150 feet activity did not occur: Safety/medical concerns       Blood pressure (!) 122/56, pulse 77, temperature 98.1 F (36.7 C), temperature source Oral, resp. rate 17, height 5\' 5"  (1.651 m), weight 64.4 kg, SpO2 98 %.  Medical Problem List and Plan: 1. 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 Extension - check repeat CT head             -  patient may shower             -ELOS/Goals: 9/15  -Continue CIR therapies including PT, OT, and SLP              -WHO for RUE 2.  Antithrombotics: -DVT/anticoagulation:  Pharmaceutical: Lovenox             -antiplatelet therapy: DAPT X 3 months followed by ASA alone.  3. Pain Management: Tylenol prn.  CVA related pain developing tone in RIght finger an wrist flexors, cont ROM, premedicate with tylenol discussed with RN and PT 4. Mood/Behavior/Sleep: LCSW to follow for evaluation and support.              -antipsychotic agents: N/A 5. Neuropsych/cognition: This patient is not fully capable of making decisions on his own behalf due to aphasia. 6. Skin/Wound Care: Routine pressure relief measures.  7. Fluids/Electrolytes/Nutrition: Monitor I/O. Check CMET in am for follow up on AKI/LFTs.         HypoK+ supplement and recheck K+ - WNL    Latest Ref Rng & Units 02/25/2022    6:38 AM 02/21/2022    7:54 AM 02/18/2022    5:05 AM  BMP  Glucose 70 - 99 mg/dL 962   952   BUN 8 - 23 mg/dL 24   26   Creatinine 8.41 - 1.24 mg/dL 3.24   4.01   Sodium 027 - 145 mmol/L 134   142   Potassium 3.5 - 5.1 mmol/L 4.2  4.2  3.2   Chloride 98 - 111 mmol/L 97   106   CO2 22 - 32 mmol/L 26   28    Calcium 8.9 - 10.3 mg/dL 8.5   8.5     HypoK+ will supplement , not on diuretics but had several stools on 8/27  9/2- last K+ 4.2- doing well 8. L-MCA infarct with hemorrhagic conversion: DAPT X 90 day followed by ASA alone 9. HTN: Monitor BP TID. BP remains labile. Resume Proscar.  --Continue Metoprolol--was titrated upwards 08/27 for better control.  9/4- BP controlled- con't regimen Vitals:   02/25/22 1937 02/26/22 0538  BP: 121/65 (!) 122/56  Pulse: 85 77  Resp: 17 17  Temp: 98.3 F (36.8 C) 98.1 F (36.7 C)  SpO2: 97% 98%    10. Dysphagia: Continue D1, honey thick liquids. Needs assistance for feeding and supervision for safety.  --hypernatremia/AKI likely due to dysphagia diet 11. BPH: Monitor for any voiding difficulties. Will order PVR checks as off his meds.  --Proscar was not resumed. Flomax was d/c on 08/20.  12. Pre-renal azotemia: Offer fluids between meals.  --Indicated thirst -->drank honey liquids without negative expression.  -supplement with IVF if needed 9/2- will recheck Monday and see if doing better- last BUN 26- improving 9/4 no BMET ordered for today--check BMET 9/5 13. Covid + asymptomatic,WBC nl.  monitor for cough fever, sore throat, no antiviral unless symptomatic, maintain isolation in room 5-7 d per ID, N95 masking in room  --continue to monitor for any signs of infection/aspiration.     Latest Ref Rng & Units 02/26/2022    6:42 AM 02/25/2022    6:38 AM 02/24/2022    5:15 AM  CBC  WBC 4.0 - 10.5 K/uL 11.1  17.6  15.8   Hemoglobin 13.0 - 17.0 g/dL 25.3  66.4  40.3   Hematocrit 39.0 - 52.0 % 34.4  34.5  39.3   Platelets 150 - 400 K/uL 340  333  358  9/6 WBC trending down Afeb for ~48h Per ID Dr Baxter Flattery off precautions on 9/8   LOS: 9 days A FACE TO FACE EVALUATION WAS PERFORMED  Charlett Blake 02/26/2022, 10:13 AM

## 2022-02-26 NOTE — Progress Notes (Signed)
Patient ID: Paul Bradshaw, male   DOB: 1925-12-10, 88 y.o.   MRN: [id number]  Team Conference Report to Patient/Family  Team Conference discussion was reviewed with the patient and caregiver, including goals, any changes in plan of care and target discharge date.  Patient and caregiver express understanding and are in agreement.  The patient has a target discharge date of 03/07/22.  Sw spoke with patient son and friend, [name] via telephone and provided team conference updates. Patient will remain on Dys, 1. Patient will require extensive assistance at discharge. Patient son and friend will be present for family education on Saturday 1-4 PM.  Andria Rhein 02/26/2022, 2:07 PM

## 2022-02-27 LAB — CBC WITH DIFFERENTIAL/PLATELET
Abs Immature Granulocytes: 0.14 10*3/uL — ABNORMAL HIGH (ref 0.00–0.07)
Basophils Absolute: 0 10*3/uL (ref 0.0–0.1)
Basophils Relative: 0 %
Eosinophils Absolute: 0 10*3/uL (ref 0.0–0.5)
Eosinophils Relative: 0 %
HCT: 32.4 % — ABNORMAL LOW (ref 39.0–52.0)
Hemoglobin: 10.7 g/dL — ABNORMAL LOW (ref 13.0–17.0)
Immature Granulocytes: 2 %
Lymphocytes Relative: 15 %
Lymphs Abs: 1.4 10*3/uL (ref 0.7–4.0)
MCH: 32 pg (ref 26.0–34.0)
MCHC: 33 g/dL (ref 30.0–36.0)
MCV: 97 fL (ref 80.0–100.0)
Monocytes Absolute: 0.8 10*3/uL (ref 0.1–1.0)
Monocytes Relative: 9 %
Neutro Abs: 7 10*3/uL (ref 1.7–7.7)
Neutrophils Relative %: 74 %
Platelets: 345 10*3/uL (ref 150–400)
RBC: 3.34 MIL/uL — ABNORMAL LOW (ref 4.22–5.81)
RDW: 11.6 % (ref 11.5–15.5)
WBC: 9.4 10*3/uL (ref 4.0–10.5)
nRBC: 0 % (ref 0.0–0.2)

## 2022-02-27 MED ORDER — DICLOFENAC SODIUM 1 % EX GEL
2.0000 g | Freq: Four times a day (QID) | CUTANEOUS | Status: DC
  Administered 2022-02-27 – 2022-03-13 (×50): 2 g via TOPICAL
  Filled 2022-02-27: qty 100

## 2022-02-27 NOTE — Progress Notes (Signed)
PROGRESS NOTE   Subjective/Complaints: RIght wrist and finger pain , OT feels this may be from wrist joint    ROS: limited due to language/communication   Objective:   CT HEAD WO CONTRAST (5MM)  Result Date: 02/25/2022 CLINICAL DATA:  Stroke follow-up. Increased left gave his preference EXAM: CT HEAD WITHOUT CONTRAST TECHNIQUE: Contiguous axial images were obtained from the base of the skull through the vertex without intravenous contrast. RADIATION DOSE REDUCTION: This exam was performed according to the departmental dose-optimization program which includes automated exposure control, adjustment of the mA and/or kV according to patient size and/or use of iterative reconstruction technique. COMPARISON:  Head CT 02/09/2022 FINDINGS: Brain: Interval fogging and early mineralization of left frontal cortically based infarct when compared to prior. No new infarct is seen. No hemorrhage, hydrocephalus, or masslike finding. Brain atrophy and chronic small vessel ischemia. Vascular: No hyperdense vessel. Extensive atheromatous calcification of the intracranial ICA. Skull: Normal. Negative for fracture or focal lesion. Sinuses/Orbits: No acute finding. IMPRESSION: Interval evolution of the recent left MCA branch infarct. No new or acute finding. Electronically Signed   By: Jorje Guild M.D.   On: 02/25/2022 11:31   Recent Labs    02/26/22 0642 02/27/22 0535  WBC 11.1* 9.4  HGB 11.3* 10.7*  HCT 34.4* 32.4*  PLT 340 345     Recent Labs    02/25/22 0638  NA 134*  K 4.2  CL 97*  CO2 26  GLUCOSE 117*  BUN 24*  CREATININE 1.01  CALCIUM 8.5*      Intake/Output Summary (Last 24 hours) at 02/27/2022 1033 Last data filed at 02/27/2022 0757 Gross per 24 hour  Intake 472 ml  Output 0 ml  Net 472 ml      Pressure Injury 02/07/22 Perineum Medial Stage 1 -  Intact skin with non-blanchable redness of a localized area usually over a bony  prominence. 2 1/2in by 1 inch redness, non blanching (Active)  02/07/22 1327  Location: Perineum  Location Orientation: Medial  Staging: Stage 1 -  Intact skin with non-blanchable redness of a localized area usually over a bony prominence.  Wound Description (Comments): 2 1/2in by 1 inch redness, non blanching  Present on Admission: Yes    Physical Exam: Vital Signs Blood pressure (!) 113/58, pulse 82, temperature 97.8 F (36.6 C), temperature source Oral, resp. rate 16, height 5\' 5"  (1.651 m), weight 64.4 kg, SpO2 96 %.  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 Does nod Y/N. Moving BL LE and LUE in bed. Flaccid RUE. Does not follow commands for strength testing consistently.  Responsive and localizing to pain except in RUE.      Assessment/Plan: 1.  Functional deficits which require 3+ hours per day of interdisciplinary therapy in a comprehensive inpatient rehab setting. Physiatrist is providing close team supervision and 24 hour management of active medical problems listed below. Physiatrist and rehab team continue to assess barriers to discharge/monitor patient progress toward functional and medical goals  Care Tool:  Bathing    Body parts bathed by patient: Chest, Face, Abdomen, Right upper leg, Left upper leg   Body parts bathed by helper: Right arm, Left arm, Buttocks, Front perineal area, Right lower leg, Left lower leg      Bathing assist Assist Level: Moderate Assistance - Patient 50 - 74%     Upper Body Dressing/Undressing Upper body dressing   What is the patient wearing?: Pull over shirt    Upper body assist Assist Level: Moderate Assistance - Patient 50 - 74%    Lower Body Dressing/Undressing Lower body dressing      What is the patient wearing?: Incontinence brief, Pants     Lower body assist Assist for lower body dressing: Maximal Assistance - Patient 25 - 49%     Toileting Toileting    Toileting assist Assist for toileting: Total Assistance - Patient < 25%     Transfers Chair/bed transfer  Transfers assist  Chair/bed transfer activity did not occur: Safety/medical concerns  Chair/bed transfer assist level: Moderate Assistance - Patient 50 - 74%     Locomotion Ambulation   Ambulation assist   Ambulation activity did not occur: Safety/medical concerns          Walk 10 feet activity   Assist  Walk 10 feet activity did not occur: Safety/medical concerns        Walk 50 feet activity   Assist Walk 50 feet with 2 turns activity did not occur: Safety/medical concerns         Walk 150 feet activity   Assist Walk 150 feet activity did not occur: Safety/medical concerns         Walk 10 feet on uneven surface  activity   Assist Walk 10 feet on uneven surfaces activity did not occur: Safety/medical concerns         Wheelchair     Assist Is the patient using a wheelchair?: Yes (Did not use one PTA) Type of Wheelchair: Manual Wheelchair activity did not occur: Safety/medical concerns         Wheelchair 50 feet with 2 turns activity    Assist    Wheelchair 50 feet with 2 turns activity did not occur: Safety/medical concerns       Wheelchair 150 feet activity     Assist  Wheelchair 150 feet activity did not occur: Safety/medical concerns       Blood pressure (!) 113/58, pulse 82, temperature 97.8 F (36.6 C), temperature  source Oral, resp. rate 16, height 5\' 5"  (1.651 m), weight 64.4 kg, SpO2 96 %.  Medical Problem List and Plan: 1. 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 Extension - check repeat CT head             -patient may shower             -ELOS/Goals: 9/15  -Continue CIR therapies including PT, OT, and SLP              -WHO for RUE 2.  Antithrombotics: -DVT/anticoagulation:  Pharmaceutical: Lovenox             -  antiplatelet therapy: DAPT X 3 months followed by ASA alone.  3. Pain Management: Tylenol prn.  CVA related pain developing tone in RIght finger an wrist flexors, cont ROM, premedicate with tylenol discussed with RN and OT- trial Voltaren gel   4. Mood/Behavior/Sleep: LCSW to follow for evaluation and support.              -antipsychotic agents: N/A 5. Neuropsych/cognition: This patient is not fully capable of making decisions on his own behalf due to aphasia. 6. Skin/Wound Care: Routine pressure relief measures.  7. Fluids/Electrolytes/Nutrition: Monitor I/O. Check CMET in am for follow up on AKI/LFTs.         HypoK+ supplement and recheck K+ - WNL    Latest Ref Rng & Units 02/25/2022    6:38 AM 02/21/2022    7:54 AM 02/18/2022    5:05 AM  BMP  Glucose 70 - 99 mg/dL 510   258   BUN 8 - 23 mg/dL 24   26   Creatinine 5.27 - 1.24 mg/dL 7.82   4.23   Sodium 536 - 145 mmol/L 134   142   Potassium 3.5 - 5.1 mmol/L 4.2  4.2  3.2   Chloride 98 - 111 mmol/L 97   106   CO2 22 - 32 mmol/L 26   28   Calcium 8.9 - 10.3 mg/dL 8.5   8.5     HypoK+ will supplement , not on diuretics but had several stools on 8/27  9/2- last K+ 4.2- doing well 8. L-MCA infarct with hemorrhagic conversion: DAPT X 90 day followed by ASA alone 9. HTN: Monitor BP TID. BP remains labile. Resume Proscar.  --Continue Metoprolol--was titrated upwards 08/27 for better control.  9/4- BP controlled- con't regimen Vitals:   02/26/22  2021 02/27/22 0518  BP: 131/68 (!) 113/58  Pulse: 85 82  Resp: 16 16  Temp: 97.6 F (36.4 C) 97.8 F (36.6 C)  SpO2: 99% 96%    10. Dysphagia: Continue D1, honey thick liquids. Needs assistance for feeding and supervision for safety.  --hypernatremia/AKI likely due to dysphagia diet 11. BPH: Monitor for any voiding difficulties. Will order PVR checks as off his meds.  --Proscar was not resumed. Flomax was d/c on 08/20.  12. Pre-renal azotemia: Offer fluids between meals.  --Indicated thirst -->drank honey liquids without negative expression.  -supplement with IVF if needed 9/2- will recheck Monday and see if doing better- last BUN 26- improving 9/4 no BMET ordered for today--check BMET 9/5 13. Covid + asymptomatic,WBC nl.  monitor for cough fever, sore throat, no antiviral unless symptomatic, maintain isolation in room 5-7 d per ID, N95 masking in room  --continue to monitor for any signs of infection/aspiration.     Latest Ref Rng & Units 02/27/2022    5:35 AM 02/26/2022    6:42 AM 02/25/2022    6:38 AM  CBC  WBC 4.0 - 10.5 K/uL 9.4  11.1  17.6   Hemoglobin 13.0 - 17.0 g/dL 14.4  31.5  40.0   Hematocrit 39.0 - 52.0 % 32.4  34.4  34.5   Platelets 150 - 400 K/uL 345  340  333    9/6 WBC trending down Afeb for ~48h Per ID Dr Drue Second off precautions on 9/8   LOS: 10 days A FACE TO FACE EVALUATION WAS PERFORMED  Erick Colace 02/27/2022, 10:33 AM

## 2022-02-27 NOTE — Progress Notes (Signed)
Patient ID: Paul Bradshaw, male   DOB: [birth year], 88 y.o.   MRN: [id number]  No d/c reccs for patient currently.

## 2022-02-27 NOTE — Progress Notes (Signed)
Occupational Therapy Session Note  Patient Details  Name: Jonah L Groeneveld MRN: [id number] Date of Birth: 08/01/1925  Today's Date: 02/27/2022 OT Individual Time: 0930-1015 OT Individual Time Calculation (min): 45 min    Short Term Goals: Week 2:  OT Short Term Goal 1 (Week 2): Pt will be able to sit to EOB with mod A to prep for self care. OT Short Term Goal 2 (Week 2): Pt will be able to sit to stand from EOB with mod A to prep transfer. OT Short Term Goal 3 (Week 2): Pt will tolerate standing with mod A to enable caregives to A him with pericare post toileting. OT Short Term Goal 4 (Week 2): Pt will transfer to BSC with mod A.  Skilled Therapeutic Interventions/Progress Updates:    Pt received in recliner alert and overall more engaged with this therapist today. Pt continues to be on contact precautions so all therapy performed in room.   Focus of todays session was on RUE PROM, and facilitation of movement. Today he was able to tolerate full range through sh and elbow but continues to have wrist pain. He has tightness in wrist and only able to bring wrist into 5 degrees of extension.   Pt worked on various hand over hand guiding strategies for arm movements but unable to elicit any active movement.  Pt able to rise to stand (with R hand placed on walker splint first to support RUE) with only min A today. Focused on upright posture as pt has significant trunk flexion with rounded shoulders. Pt able to tolerate standing and was able to extend spine more than yesterday.  Pt worked on wt shifts following cues but unable to follow cues to march in place.  Hopefully tomorrow, contact precautions will be lifted and pt will be able to participate more in the gym environment. Pt resting in recliner with belt alarm on and all needs met.   Therapy Documentation Precautions:  Precautions Precautions: Fall Precaution Comments: R hemipareisis, expressive>receptive aphasia Restrictions Weight Bearing  Restrictions: No  Pain: grimacing with touch or passive movement to R wrist - MD aware   ADL: ADL Eating: Maximal assistance Grooming: Moderate cueing, Supervision/safety Where Assessed-Grooming: Edge of bed Upper Body Bathing: Moderate assistance Where Assessed-Upper Body Bathing: Other (Comment) (sitting on BSC) Lower Body Bathing: Moderate assistance Where Assessed-Lower Body Bathing: Other (Comment) (sitting on BSC) Upper Body Dressing: Moderate assistance Where Assessed-Upper Body Dressing: Other (Comment) (sitting on BSC) Lower Body Dressing: Maximal assistance Where Assessed-Lower Body Dressing: Other (Comment) (sitting on BSC) Toileting: Dependent (continent today) Where Assessed-Toileting: Bedside Commode Toilet Transfer: Moderate assistance Toilet Transfer Method: Stand pivot Toilet Transfer Equipment: Bedside commode   Therapy/Group: Individual Therapy  SAGUIER,JULIA 02/27/2022, 11:14 AM 

## 2022-02-27 NOTE — Progress Notes (Signed)
Physical Therapy Session Note  Patient Details  Name: Paul Bradshaw MRN: [id number] Date of Birth: 1925-12-12  Today's Date: 02/27/2022 PT Individual Time: 0800-0842 PT Individual Time Calculation (min): 42 min    Short Term Goals: Week 1:  PT Short Term Goal 1 (Week 1): Pt will perform bed mobility with overall CGA and using no bed features. PT Short Term Goal 2 (Week 1): Pt will perform all functional transfers with light MinA/ CGA and LRAD. PT Short Term Goal 3 (Week 1): Pt will ambulate at least 40 ft using LRAD with CGA/ MinA. PT Short Term Goal 4 (Week 1): Pt will initiate stair training. PT Short Term Goal 5 (Week 1): Pt will perform Berg Balance test.  Skilled Therapeutic Interventions/Progress Updates:     Airborne and contact PPE donned prior to entrance to room 2/2 COVID precautions. In-room therapy only, per protocol.   Pt presents in bed, awake and appears agreeable to PT tx. No signs of pain during treatment. Attempted to use communication board in room but patient nodding 'yes' to every option.  Supine<>sitting EOB with minA with HOB raised, assist primarily for task initiation and for scooting to EOB to achieve feet flat. Sitting balance fair without UE support, no LOB or leaning present. Stedy transfer to bathroom with him requiring modA for powering to rise in [place] and CGA for sitting balance while in perched position.  While on toilet, pt continent of bladder only - charted.  Stedy transfer to the sink where he completed a few self care tasks while remaining in the [place] to challenge balance, endurance, and encourage weight bearing through RLE. He required totalA (pt <25%) for all self care tasks including washing his UB, face, and back. He required dependant assist for applying deodorant to both underarms. He required maxA for donning t-shirt via hemi technique.   Returned to bed via [place] and assist him with donning pants (jeans) at maxA level. Donned tennis shoes with  dependant assist.   Completed short distance ambulatory transfer with min/modA and no AD from EOB to recliner. Pillows provided to support paretic R side and improve posture. BLE elevated. Safety belt alarm on and call bell in lap.   Patient with improved affect with bathing and especially when he was fully dressed in his clothes. Pt smiling and appearing to thank PT for assistance.   Based on today's progress, anticipate his progress will continue to improve once in-room limitations/precautions are lifted.    Therapy Documentation Precautions:  Precautions Precautions: Fall Precaution Comments: R hemipareisis, expressive>receptive aphasia Restrictions Weight Bearing Restrictions: No General:     Therapy/Group: Individual Therapy  Ski Polich P Naji Mehringer PT 02/27/2022, 7:42 AM

## 2022-02-27 NOTE — Progress Notes (Signed)
Speech Language Pathology Daily Session Note  Patient Details  Name: Paul Bradshaw MRN: [id number] Date of Birth: 04-24-1926  Today's Date: 02/27/2022 SLP Individual Time: 1405-1500 SLP Individual Time Calculation (min): 55 min  Short Term Goals: Week 2: SLP Short Term Goal 1 (Week 2): STG=LTG due to ELOS  Skilled Therapeutic Interventions: Skilled ST treatment focused on language and swallowing goals. Pt was received semi reclined in recliner chair on arrival. Pt appeared fatigued this afternoon.   SLP facilitated therapeutic PO trials with nectar thick liquids. Pt consumed by cup with suspected swallow initiation delay, mod-to-max anterior labial spillage on right without awareness, and rather fast/impulsive rate of consumption requiring mod-to-max A verbal cues to slow down and take small, single sips. Pt exhibited no immediate or delayed coughing events, however delayed throat clearing evident following ~30 seconds post trials. Continue to recommend current diet and trials with SLP.  SLP facilitated comprehension of basic yes/no questions with 50% accuracy given max A multimodal cues for biographical information, and 60% accuracy given max A multimodal cues for environmental questions.   SLP facilitated object ID in field of 2 with 25% accuracy given max A cues to initiate and understand prompt to point to objects. Pt responded best to verbal cues + visual demonstration + tactile prompt by tapping L finger.    Pt was not stimulable for use of low tech communication supports despite total A multimodal cues using picture board in field of 2 pictures, yes/no board, or dry erase board with field of 2 written words.   Pt with no intentional vocalizations this date and less responsive to yes/no questions regarding personal needs/comfort. Suspect this attributed to fatigue.   Patient was left in recliner with alarm activated and immediate needs within reach at end of session. Continue per current  plan of care.      Pain  No pain behaviors exhibited  Therapy/Group: Individual Therapy  Tamala Ser 02/27/2022, 2:27 PM

## 2022-02-28 NOTE — Progress Notes (Signed)
Infection control prevention called re: d/c of contact precaution; per IC if pt is asymptomatic- mid on symptoms may come off today ; this is patient's day 11; if patient is mod -severe symptoms extend to 11 days more. Per RN Lurena Joiner no coughing/ fever noted; Had 1 low grade fever Monday but does not have anymore after. Pam PA notified; New order noted.

## 2022-02-28 NOTE — Progress Notes (Signed)
Patient to begin continuous pulse ox monitoring d/t observed periods of apnea during sleep. PA aware

## 2022-02-28 NOTE — Progress Notes (Signed)
Occupational Therapy Session Note  Patient Details  Name: Paul Bradshaw MRN: [id number] Date of Birth: 10/15/1925  Today's Date: 02/28/2022 OT Individual Time: 0930-1015 OT Individual Time Calculation (min): 45 min    Short Term Goals: Week 1:  OT Short Term Goal 1 (Week 1): Pt will be able to sit to stand to prep for LB dressing/ toileting with min A. OT Short Term Goal 1 - Progress (Week 1): Not progressing OT Short Term Goal 2 (Week 1): Pt will be able to stand pivot to [facility] with mod A. OT Short Term Goal 2 - Progress (Week 1): Not progressing OT Short Term Goal 3 (Week 1): Pt will be able to stand upright with min A to increase safety with standing during clothing management. OT Short Term Goal 3 - Progress (Week 1): Not progressing OT Short Term Goal 4 (Week 1): Pt will don shirt with mod A. OT Short Term Goal 4 - Progress (Week 1): Not progressing OT Short Term Goal 5 (Week 1): Pt will demonstrate awareness of RUE by moving R arm into position with LUE when cued. OT Short Term Goal 5 - Progress (Week 1): Not progressing Week 2:  OT Short Term Goal 1 (Week 2): Pt will be able to sit to EOB with mod A to prep for self care. OT Short Term Goal 2 (Week 2): Pt will be able to sit to stand from EOB with mod A to prep transfer. OT Short Term Goal 3 (Week 2): Pt will tolerate standing with mod A to enable caregives to A him with pericare post toileting. OT Short Term Goal 4 (Week 2): Pt will transfer to [facility] with mod A.  Skilled Therapeutic Interventions/Progress Updates:    Pt received in bed and agreeable to trying to walk to bathroom. Had pt sit to EOB on his R side with mod A.  Using RW, with hand splint, pt ambulated to toilet with min A with mod cues to advance his R leg and to not push walker too far forward. Pt able to void continent bowel on toilet (documented in flow sheets).  The then stepped to shower with mod cues on how to turn around. Sat on shower bench to shower with mod A. He  then did a stand pivot to w/c with min A,  mod -max with dressing due to having a button up shirt and limited time as session time was coming to an end. His NT arrived to continue assisting getting pt settled in w/c.  Pt participated well and showed good progress with his mobility today. No movement noted in RUE.   Therapy Documentation Precautions:  Precautions Precautions: Fall Precaution Comments: R hemipareisis, expressive>receptive aphasia Restrictions Weight Bearing Restrictions: No      Pain: slight grimacing with R wrist touch Pain Assessment Pain Scale: 0-10 Pain Score: 0-No pain ADL: ADL Eating: Moderate assistance Grooming: Moderate cueing, Supervision/safety Where Assessed-Grooming: Edge of bed Upper Body Bathing: Moderate assistance Where Assessed-Upper Body Bathing: Shower Lower Body Bathing: Moderate assistance Where Assessed-Lower Body Bathing: Shower Upper Body Dressing: Moderate assistance Where Assessed-Upper Body Dressing: Wheelchair Lower Body Dressing: Maximal assistance Where Assessed-Lower Body Dressing: Wheelchair Toileting: Dependent (continent today) Where Assessed-Toileting: Teacher, adult education: Moderate assistance Toilet Transfer Method: Proofreader: Raised toilet seat Film/video editor: Moderate assistance Film/video editor Method: Designer, industrial/product: Emergency planning/management officer, Grab bars      Therapy/Group: Individual Therapy  Paul Bradshaw 02/28/2022, 12:49 PM

## 2022-02-28 NOTE — Progress Notes (Signed)
Speech Language Pathology Daily Session Note  Patient Details  Name: Paul Bradshaw MRN: [id number] Date of Birth: Aug 14, 1925  Today's Date: 02/28/2022 SLP Individual Time: 0800-0900 SLP Individual Time Calculation (min): 60 min  Short Term Goals: Week 2: SLP Short Term Goal 1 (Week 2): STG=LTG due to ELOS  Skilled Therapeutic Interventions: Skilled ST treatment focused on dysphagia and language goals. SLP facilitated dysphagia 2 trial tray with morning meal. Pt consumed scrambled eggs with significantly prolonged and ineffective mastication (3+ minutes), decreased bolus manipulation, buccal stasis, and anterior spillage on right. SLP eventually removed from oral cavity and discontinued consumption of dysphagia 2 textures. Pt consumed pureed textures with mild anterior spillage and mild oral residuals. Pt exhibited cough response x1 with puree toward end of meal. Pt consumed honey thick liquids by cup with delayed cough response x1. Pt performed self feeding with max A, and mod A for implementation of swallowing precautions and strategies; mod A verbal/visual cues to wipe mouth of spillage. Pt consumed 75% of meal and of honey thick liquid. Continue to recommend dyshagia 1 diet with honey thick liquids, crushed meds, full 1:1 supervision with all intake. Plan for MBS next week to further evaluate oropharyngeal swallow function for possible liquid advancement.  Anticipate pt will discharge on dys 1 diet due to slow progress and poor tolerance to dys 2 trials. Will continue PO trials.   From a communication perspective, pt nodded head yes/no in response to questions throughout meal including "more", "all done", salt/pepper, "all done" during 70% of occasions following initial prompt, and progressing to 90% of occasions with mod A verbal repetition and visual cues. Pt pointed to 2-3 choices to communicate preferences with food options with total fading to mod A verbal/visual cues. Suspect motor  impairments impact efficiency.   Pt was incontinent of urine and followed 1-step commands for bed level peri care with mod A verbal/visual cues and total A for toileting and doffing/donning brief.   Patient was left in bed with alarm activated and immediate needs within reach at end of session. Continue per current plan of care.      Pain  None/denied  Therapy/Group: Individual Therapy  Tamala Ser 02/28/2022, 9:13 AM

## 2022-02-28 NOTE — Progress Notes (Signed)
Physical Therapy Weekly Progress Note  Patient Details  Name: Paul Bradshaw MRN: [id number] Date of Birth: 1925/07/28  Beginning of progress report period: February 17, 2022 End of progress report period: February 28, 2022  {CHL IP REHAB PT TIME CALCULATION:304800500}  Patient has met {number 1-5:22450} of {number 1-5:20334} short term goals.  ***  Patient continues to demonstrate the following deficits {impairments:3041632} and therefore will continue to benefit from skilled PT intervention to increase functional independence with mobility.  Patient  expected to start to progress toward LTGs now that he is recovering from [place] and is now able to leave room .Is definitely today demonstrating increased alertness and ability to interact and participate.   {plan of WIOM:3559741}  PT Short Term Goals {ULA:4536468}  Skilled Therapeutic Interventions/Progress Updates:  Patient *** on entrance to room. Patient alert and agreeable to PT session.   Patient with no pain complaint at start of session.  Therapeutic Activity: Bed Mobility: Pt performed supine <> sit with ***. VC/ tc required for ***. Transfers: Pt performed sit<>stand and stand pivot transfers throughout session with ***. Provided verbal cues for***.  Gait Training:  Pt ambulated *** ft using *** with ***. Demonstrated ***. Provided vc/ tc for ***.  Wheelchair Mobility:  Pt propelled wheelchair *** feet with ***. Provided vc for ***.  Neuromuscular Re-ed: NMR facilitated during session with focus on***. Pt guided in ***. NMR performed for improvements in motor control and coordination, balance, sequencing, judgement, and self confidence/ efficacy in performing all aspects of mobility at highest level of independence.   Patient seated upright in w/c at end of session with brakes locked, belt alarm set, and all needs within reach. Tray table set up for upcoming lunch.   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

## 2022-03-01 MED ORDER — METOPROLOL TARTRATE 12.5 MG HALF TABLET
12.5000 mg | ORAL_TABLET | Freq: Two times a day (BID) | ORAL | Status: DC
  Administered 2022-03-02 – 2022-03-13 (×23): 12.5 mg via ORAL
  Filled 2022-03-01 (×23): qty 1

## 2022-03-01 NOTE — Progress Notes (Signed)
Occupational Therapy Session Note  Patient Details  Name: Paul Bradshaw MRN: [id number] Date of Birth: Jun 20, 1926  Today's Date: 03/01/2022 OT Individual Time: 6063-0160 OT Individual Time Calculation (min): 45 min    Short Term Goals: Week 1:  OT Short Term Goal 1 (Week 1): Pt will be able to sit to stand to prep for LB dressing/ toileting with min A. OT Short Term Goal 1 - Progress (Week 1): Not progressing OT Short Term Goal 2 (Week 1): Pt will be able to stand pivot to [facility] with mod A. OT Short Term Goal 2 - Progress (Week 1): Not progressing OT Short Term Goal 3 (Week 1): Pt will be able to stand upright with min A to increase safety with standing during clothing management. OT Short Term Goal 3 - Progress (Week 1): Not progressing OT Short Term Goal 4 (Week 1): Pt will don shirt with mod A. OT Short Term Goal 4 - Progress (Week 1): Not progressing OT Short Term Goal 5 (Week 1): Pt will demonstrate awareness of RUE by moving R arm into position with LUE when cued. OT Short Term Goal 5 - Progress (Week 1): Not progressing  Skilled Therapeutic Interventions/Progress Updates:    Patient in bed upon arrival, pt acknowledge me based on his schedule for today.  The pt was asked if he would like to bathe and dress this A.M, he nodded in the affirmative.  The pt was able to sit up in the bed and maintain position for a short periods of time with ModA for coming to the EOB.  The pt was able to  bathe UB bed LOF, completing his face and parts of his chest with ModA, he was Dependent with LB bathing.  The pt was able to donn his shirt with ModA and he presented as dependent  for LB dressing of a brief. The pt expressed a desire to remain at bed LOF , but was willing to complete AROM/PROM of the RUE, the pt presented with some edema present in the extremity, he tolerated gentle message to reduce the incidence of edema.  The pt reported no pain at the time of treatment, his splint apparatus was on the  RUE, his bedside table and call light were within reach and his alarm was activated.  All additional needs of the pt were addressed.   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

## 2022-03-01 NOTE — Progress Notes (Signed)
Physical Therapy Session Note  Patient Details  Name: Paul Bradshaw MRN: [id number] Date of Birth: 1926-04-01  Today's Date: 03/01/2022 PT Individual Time: 7341-9379 PT Individual Time Calculation (min): 43 min   Short Term Goals: Week 1:  PT Short Term Goal 1 (Week 1): Pt will perform bed mobility with overall CGA and using no bed features. PT Short Term Goal 1 - Progress (Week 1): Progressing toward goal PT Short Term Goal 2 (Week 1): Pt will perform all functional transfers with light MinA/ CGA and LRAD. PT Short Term Goal 2 - Progress (Week 1): Progressing toward goal PT Short Term Goal 3 (Week 1): Pt will ambulate at least 40 ft using LRAD with CGA/ MinA. PT Short Term Goal 3 - Progress (Week 1): Progressing toward goal PT Short Term Goal 4 (Week 1): Pt will initiate stair training. PT Short Term Goal 4 - Progress (Week 1): Progressing toward goal PT Short Term Goal 5 (Week 1): Pt will perform Berg Balance test. PT Short Term Goal 5 - Progress (Week 1): Progressing toward goal Week 2:  PT Short Term Goal 1 (Week 2): Pt will perform bed mobility with overall CGA and using no bed features. PT Short Term Goal 2 (Week 2): Pt will perform all functional transfers with light MinA/ CGA and LRAD. PT Short Term Goal 3 (Week 2): Pt will ambulate at least 40 ft using LRAD with CGA/ MinA. PT Short Term Goal 4 (Week 2): Pt will initiate stair training.  Skilled Therapeutic Interventions/Progress Updates:  Patient supine in bed on entrance to room. Patient alert and agreeable to PT session. Son and dtr-in-law present for family education/ update on pt's condition. Disc re: potential need for extending length of stay in order to maximize results from therapy now that pt is improving in strength, fatigue level and increasing motivation while recovering from COVID infection.   Patient with no pain complaint at start of session. Very little note of pain in R wrist/ hand during session.   Therapeutic  Activity: Bed Mobility: Donning of pants initiated in bed while supine. Pt able to lift LE to initiate ModA with threading of pants. With cueing pt initiates puslling up of pants as far as he can get them while supine. With time, pt is able to bring BLE off EOB with MinA for RLE. VC throughout for location of RUE. Time and effort with pt able to scoot forward with brief bouts of MinA to reach feet positioned on floor.  Transfers: Pt performed sit<>stand and stand pivot transfers throughout session using RW and up to modA. Improving foot progression in pivot stepping throughout session as pt slow to move RLE initially. Provided verbal cues for sequence and technique.  Gait Training:  Pt ambulated 5' x1/ 15' x1 using RW with R hand saddle splint in place. Requires MinA for balance vc/ tc for increasing RLE foot clearance with increased knee/ hip flexion.   Wheelchair Mobility:  Pt propelled wheelchair 30' x2 using BLE only. Provided vc initially for iniitation and technique, then pt able to pick up and propel around obstacles with supervision. One instance of requiring CGA/ MinA to overcome direction of front wheels to continue forward propulsion.  Neuromuscular Re-ed: NMR facilitated during session with focus on RUE volitional movement, seated balance and standing balance. Pt guided in forward reach of LUE to targets held outside of seated BOS for pt in order to encourage forward lean, visual focus on target and improving head/ neck posture. AAROM  provided for reaching targets within BOS for RUE. Pt guided in minisquats for improved RLE motor control and activation. Cueing throughout for upright posture and looking forward.  NMR performed for improvements in motor control and coordination, balance, sequencing, judgement, and self confidence/ efficacy in performing all aspects of mobility at highest level of independence.   Patient seated upright in w/c at end of session with brakes locked, belt alarm  set, and all needs within reach. ST and family in room for continued family ed.   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

## 2022-03-01 NOTE — Progress Notes (Signed)
Occupational Therapy Session Note  Patient Details  Name: Paul Bradshaw MRN: [id number] Date of Birth: 17-Jan-1926  Today's Date: 03/01/2022 OT Individual Time: 4315-4008 OT Individual Time Calculation (min): 55 min    Short Term Goals: Week 2:  OT Short Term Goal 1 (Week 2): Pt will be able to sit to EOB with mod A to prep for self care. OT Short Term Goal 2 (Week 2): Pt will be able to sit to stand from EOB with mod A to prep transfer. OT Short Term Goal 3 (Week 2): Pt will tolerate standing with mod A to enable caregives to A him with pericare post toileting. OT Short Term Goal 4 (Week 2): Pt will transfer to [facility] with mod A.  Skilled Therapeutic Interventions/Progress Updates:  Pt awake seated in w/c with son & DIL present for family training upon OT arrival to the room. Pt appopriately shakes head yes/no and attempts to verbalize. Pt able to demo singing happy birthday. No intelligible words noted, however, pt noted to be on rhythm. Pt in agreement for OT session.  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 Pt on continuous pulse ox and vitals remained WFL.  Pain: Pain Assessment Pain Scale: 0-10 Pain Score: 0-No pain  ADL: Upper Body Dressing: Maximal assistance (Pt requires maximal assistance to doff/don a pull-over style shirt and a button up style shirt while seated in the w/c. Education provided to pt and family on hemi-dressing techniques with pt requiring hand-over-hand assist.) Where Assessed-Upper Body Dressing: Wheelchair Lower Body Dressing: Maximal assistance (Pt requires maximal assistance to doff and don pants at bed level after noted to be wet from incontinence of bladder whileseated in w/c.) Where Assessed-Lower Body Dressing: Bed level Toileting:  Dependent (Once returned to bed, pt's pants noted to be wet and pt had incontinence of bladder. Pt requires total assistance to perform 3/3 portions of task at bed level to perform hygiene and change brief.) Where Assessed-Toileting: Bed level ADL Comments: Education and skilled demo provided to pt and family on hemi-dressing techniques with UB dressing. Pt requires hand-over-hand assistance to perform hemi-dressing techniques due to difficulty with command following and increased fatigue at this time. Pt able to perform SPT to the L from w/c > EOB with moderate assistance with use of bed rail with LUE for support. Pt requires maximal assistance to perform sit > supine transfer with L entry to bed. Once returned to bed, pt's pants noted ot be wet and incontinence of bladder noted in brief where pt requires total assistance ot perform hyiene and change brief. Pt requires maximal assistance to doff/don pants at bed level, however, pt able to perform good bridging in bed to assist with therapist pulling clothing up/down.  Family Education: Pt's son and DIL present for family training. Skilled demo and education provided to pt and family on hemi-dressing techniques (see above for pt's performance level). OT provides education on safe bathing hemi-techniques as well. Pt's DIL reports having a tub-shower with a curtain at home and a regular shower chair, standard toilet, and that the bathroom is w/c and walker accessible. OT provided education on recommendation of a tub-bench to improve safety with tub-shower transfer along with simulated demo on technique and family verbalizes understanding. Education provided to family on pt's current level of functioning, including RUE motor control deficits. Family observed UB dressing, toileting  at bed level, and LB dressing at bed level. Pt and family report no other concerns at this time. However, OT provided education on unsure of mobility level at this time due to pt's  fluctuations on assist levels (walker vs w/c) as this will depend on pt's progress prior to DC. Pt's DIL report having seizures and a PMH of a stroke and son noted to be sitting on transport chair while on continuous supplemental O2 via [place]. Son and DIL did not report any other family members that would be available to assist, however, will plan to consult with social work to determine if there are other family members that could provide assist depending on pt's level of assist needed at time of DC.   Pt returned to bed at end of session. Pt left resting comfortably in bed with personal belongings and call light within reach, bed alarm on and activated, bed in low position, 3 bed rails up, family present in room, and comfort needs attended to.   Therapy/Group: Individual Therapy  Lavera Guise 03/01/2022, 4:28 PM

## 2022-03-01 NOTE — Progress Notes (Signed)
PROGRESS NOTE   Subjective/Complaints: No questions this morning Desats noted on pulse ox- RT consulted as patient may need CPAP while sleeping He expresses no questions/complaints   ROS: Limited due to language/communication   Objective:   No results found. Recent Labs    02/27/22 0535  WBC 9.4  HGB 10.7*  HCT 32.4*  PLT 345    No results for input(s): "NA", "K", "CL", "CO2", "GLUCOSE", "BUN", "CREATININE", "CALCIUM" in the last 72 hours.   Intake/Output Summary (Last 24 hours) at 03/01/2022 2020 Last data filed at 03/01/2022 1843 Gross per 24 hour  Intake 474 ml  Output --  Net 474 ml     Pressure Injury 02/07/22 Coccyx Medial Stage 1 -  Intact skin with non-blanchable redness of a localized area usually over a bony prominence. (Active)  02/07/22 1327  Location: Coccyx  Location Orientation: Medial  Staging: Stage 1 -  Intact skin with non-blanchable redness of a localized area usually over a bony prominence.  Wound Description (Comments):   Present on Admission: Yes    Physical Exam: Vital Signs Blood pressure (!) 102/57, pulse 86, temperature 98.5 F (36.9 C), temperature source Oral, resp. rate 16, height 5\' 5"  (1.651 m), weight 64.4 kg, SpO2 100 %.  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 Does nod Y/N. Moving BL LE and LUE in bed. Flaccid RUE. Does not follow commands for strength testing consistently.  Responsive and localizing to pain except in RUE.      Assessment/Plan: 1. Functional  deficits which require 3+ hours per day of interdisciplinary therapy in a comprehensive inpatient rehab setting. Physiatrist is providing close team supervision and 24 hour management of active medical problems listed below. Physiatrist and rehab team continue to assess barriers to discharge/monitor patient progress toward functional and medical goals  Care Tool:  Bathing    Body parts bathed by patient: Chest, Face, Abdomen, Right upper leg, Left upper leg   Body parts bathed by helper: Right arm, Left arm, Buttocks, Front perineal area, Right lower leg, Left lower leg     Bathing assist Assist Level: Moderate Assistance - Patient 50 - 74%     Upper Body Dressing/Undressing Upper body dressing   What is the patient wearing?: Pull over shirt    Upper body assist Assist Level: Moderate Assistance - Patient 50 - 74%    Lower Body Dressing/Undressing Lower body dressing      What is the patient wearing?: Incontinence brief, Pants     Lower body assist Assist for lower body dressing: Maximal Assistance - Patient 25 - 49%     Toileting Toileting    Toileting assist Assist for toileting: Total Assistance - Patient < 25%     Transfers Chair/bed transfer  Transfers assist  Chair/bed transfer activity did not occur:  Safety/medical concerns  Chair/bed transfer assist level: Moderate Assistance - Patient 50 - 74%     Locomotion Ambulation   Ambulation assist   Ambulation activity did not occur: Safety/medical concerns          Walk 10 feet activity   Assist  Walk 10 feet activity did not occur: Safety/medical concerns        Walk 50 feet activity   Assist Walk 50 feet with 2 turns activity did not occur: Safety/medical concerns         Walk 150 feet activity   Assist Walk 150 feet activity did not occur: Safety/medical concerns         Walk 10 feet on uneven surface  activity   Assist Walk 10 feet on uneven surfaces activity did not  occur: Safety/medical concerns         Wheelchair     Assist Is the patient using a wheelchair?: Yes (Did not use one PTA) Type of Wheelchair: Manual Wheelchair activity did not occur: Safety/medical concerns         Wheelchair 50 feet with 2 turns activity    Assist    Wheelchair 50 feet with 2 turns activity did not occur: Safety/medical concerns       Wheelchair 150 feet activity     Assist  Wheelchair 150 feet activity did not occur: Safety/medical concerns       Blood pressure (!) 102/57, pulse 86, temperature 98.5 F (36.9 C), temperature source Oral, resp. rate 16, height 5\' 5"  (1.651 m), weight 64.4 kg, SpO2 100 %.  Medical Problem List and Plan: 1. 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 Extension - check repeat CT head             -patient may shower             -ELOS/Goals: 9/15  -Continue CIR therapies including PT, OT, and SLP              -WHO for RUE 2.  Antithrombotics: -DVT/anticoagulation:  Pharmaceutical: Lovenox             -antiplatelet therapy: DAPT X 3 months followed by ASA alone.  3. Pain: continue Tylenol prn.  CVA related pain developing tone in RIght finger an wrist flexors, cont ROM, premedicate with tylenol discussed with RN and OT- trial Voltaren gel   4. Mood/Behavior/Sleep: LCSW to follow for evaluation and support.              -antipsychotic agents: N/A 5. Neuropsych/cognition: This patient is not fully capable of making decisions on his own behalf due to aphasia. 6. Skin/Wound Care: Routine pressure relief measures.  7. Fluids/Electrolytes/Nutrition: Monitor I/O. Check CMET in am for follow up on AKI/LFTs.         HypoK+ supplement and recheck K+ - WNL    Latest Ref Rng & Units 02/25/2022    6:38 AM 02/21/2022    7:54 AM 02/18/2022    5:05 AM  BMP  Glucose 70 - 99 mg/dL 02/20/2022   465   BUN 8 - 23 mg/dL 24   26   Creatinine 681  - 1.24 mg/dL 2.75   1.70   Sodium 0.17 - 145 mmol/L 134   142   Potassium 3.5 - 5.1 mmol/L 4.2  4.2  3.2   Chloride 98 - 111 mmol/L 97   106   CO2 22 -  32 mmol/L 26   28   Calcium 8.9 - 10.3 mg/dL 8.5   8.5     HypoK+ will supplement , not on diuretics but had several stools on 8/27  9/2- last K+ 4.2- doing well 8. L-MCA infarct with hemorrhagic conversion: DAPT X 90 day followed by ASA alone 9. HTN: Monitor BP TID. BP remains labile. Continue Proscar.  --Decrease lopressor to 12.5mg  BID given hypotension 9/4- BP controlled- con't regimen Vitals:   03/01/22 1438 03/01/22 1920  BP: 114/64 (!) 102/57  Pulse: 81 86  Resp: 16 16  Temp: 98 F (36.7 C) 98.5 F (36.9 C)  SpO2: 94% 100%    10. Dysphagia: Continue D1, honey thick liquids. Needs assistance for feeding and supervision for safety.  --hypernatremia/AKI likely due to dysphagia diet 11. BPH: Monitor for any voiding difficulties. Will order PVR checks as off his meds.  --Proscar was not resumed. Flomax was d/c on 08/20.  12. Pre-renal azotemia: Offer fluids between meals.  --Indicated thirst -->drank honey liquids without negative expression.  -supplement with IVF if needed 9/2- will recheck Monday and see if doing better- last BUN 26- improving 9/4 no BMET ordered for today--check BMET 9/5 13. Covid + asymptomatic,WBC nl.  monitor for cough fever, sore throat, no antiviral unless symptomatic, maintain isolation in room 5-7 d per ID, N95 masking in room  --continue to monitor for any signs of infection/aspiration.     Latest Ref Rng & Units 02/27/2022    5:35 AM 02/26/2022    6:42 AM 02/25/2022    6:38 AM  CBC  WBC 4.0 - 10.5 K/uL 9.4  11.1  17.6   Hemoglobin 13.0 - 17.0 g/dL 01.0  27.2  53.6   Hematocrit 39.0 - 52.0 % 32.4  34.4  34.5   Platelets 150 - 400 K/uL 345  340  333    9/6 WBC trending down Afeb for ~48h Per ID Dr Drue Second off precautions on 9/8   LOS: 12 days A FACE TO FACE EVALUATION WAS PERFORMED  Clint Bolder P  Makailey Hodgkin 03/01/2022, 8:20 PM

## 2022-03-01 NOTE — Progress Notes (Signed)
Speech Language Pathology Daily Session Note  Patient Details  Name: Paul Bradshaw MRN: [id number] Date of Birth: 07-22-1925  Today's Date: 03/01/2022 SLP Individual Time: 1346-1430 SLP Individual Time Calculation (min): 44 min  Short Term Goals: Week 2: SLP Short Term Goal 1 (Week 2): STG=LTG due to ELOS  Skilled Therapeutic Interventions: Pt seen for skilled ST with focus on swallowing and speech goals as well as family education, pt upright in wheelchair following PT session and agreeable to therapeutic tasks. Pt's son and son's girlfriend present throughout, provided extensive education re: current diet, swallow precautions and recommendations including 1:1 assist and supervision, aphasia communication strategies and reviewed multiple handouts to reinforce these concepts. Family appears to understand and asking appropriate questions. Pt quite alert throughout session, responding to yes/no questions related to wants/needs with 80-90% accuracy with min A cues. Pt approximating "Happy Birthday", "Twinkle Twinkle Little Star" and "Jingle Bells" with mod A multimodal cues, vocalizing throughout song durations. With max A cues and focus on mimicking SLPs placement of articulators, pt able to state his name "[name]" and "[name]" x3. Pt and family quite emotional, discussed stroke recovery process and support for patient at discharge. Pt left in wheelchair with family present for needs awaiting next education session. Cont ST POC.   Pain Pain Assessment Pain Scale: 0-10 Pain Score: 0-No pain  Therapy/Group: Individual Therapy  Tacey Ruiz 03/01/2022, 2:33 PM

## 2022-03-02 NOTE — Progress Notes (Signed)
PROGRESS NOTE   Subjective/Complaints: Sleepy this morning.  Tolerated BP well Incontinent of bladder Impaired communication due to aphasia   ROS: Limited due to language/communication   Objective:   No results found. No results for input(s): "WBC", "HGB", "HCT", "PLT" in the last 72 hours.   No results for input(s): "NA", "K", "CL", "CO2", "GLUCOSE", "BUN", "CREATININE", "CALCIUM" in the last 72 hours.   Intake/Output Summary (Last 24 hours) at 03/02/2022 1102 Last data filed at 03/02/2022 0454 Gross per 24 hour  Intake 294 ml  Output --  Net 294 ml     Pressure Injury 02/07/22 Coccyx Medial Stage 1 -  Intact skin with non-blanchable redness of a localized area usually over a bony prominence. (Active)  02/07/22 1327  Location: Coccyx  Location Orientation: Medial  Staging: Stage 1 -  Intact skin with non-blanchable redness of a localized area usually over a bony prominence.  Wound Description (Comments):   Present on Admission: Yes    Physical Exam: Vital Signs Blood pressure (!) 128/58, pulse 69, temperature (!) 97.5 F (36.4 C), temperature source Oral, resp. rate 18, height 5\' 5"  (1.651 m), weight 64.4 kg, SpO2 100 %.  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 Does nod Y/N. Moving BL LE and LUE in bed. Flaccid RUE. Does not follow commands for strength testing consistently.  Responsive and localizing to pain except in RUE.      Assessment/Plan: 1. Functional deficits which require 3+ hours per day  of interdisciplinary therapy in a comprehensive inpatient rehab setting. Physiatrist is providing close team supervision and 24 hour management of active medical problems listed below. Physiatrist and rehab team continue to assess barriers to discharge/monitor patient progress toward functional and medical goals  Care Tool:  Bathing    Body parts bathed by patient: Chest, Face, Abdomen, Right upper leg, Left upper leg   Body parts bathed by helper: Right arm, Left arm, Buttocks, Front perineal area, Right lower leg, Left lower leg     Bathing assist Assist Level: Moderate Assistance - Patient 50 - 74%     Upper Body Dressing/Undressing Upper body dressing   What is the patient wearing?: Pull over shirt    Upper body assist Assist Level: Moderate Assistance - Patient 50 - 74%    Lower Body Dressing/Undressing Lower body dressing      What is the patient wearing?: Incontinence brief, Pants     Lower body assist Assist for lower body dressing: Maximal Assistance - Patient 25 - 49%     Toileting Toileting    Toileting assist Assist for toileting: Total Assistance - Patient < 25%     Transfers Chair/bed transfer  Transfers assist  Chair/bed transfer activity did not occur: Safety/medical concerns  Chair/bed transfer assist level: Moderate Assistance - Patient 50 -  74%     Locomotion Ambulation   Ambulation assist   Ambulation activity did not occur: Safety/medical concerns          Walk 10 feet activity   Assist  Walk 10 feet activity did not occur: Safety/medical concerns        Walk 50 feet activity   Assist Walk 50 feet with 2 turns activity did not occur: Safety/medical concerns         Walk 150 feet activity   Assist Walk 150 feet activity did not occur: Safety/medical concerns         Walk 10 feet on uneven surface  activity   Assist Walk 10 feet on uneven surfaces activity did not occur: Safety/medical concerns          Wheelchair     Assist Is the patient using a wheelchair?: Yes (Did not use one PTA) Type of Wheelchair: Manual Wheelchair activity did not occur: Safety/medical concerns         Wheelchair 50 feet with 2 turns activity    Assist    Wheelchair 50 feet with 2 turns activity did not occur: Safety/medical concerns       Wheelchair 150 feet activity     Assist  Wheelchair 150 feet activity did not occur: Safety/medical concerns       Blood pressure (!) 128/58, pulse 69, temperature (!) 97.5 F (36.4 C), temperature source Oral, resp. rate 18, height 5\' 5"  (1.651 m), weight 64.4 kg, SpO2 100 %.  Medical Problem List and Plan: 1. 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 Extension - check repeat CT head             -patient may shower             -ELOS/Goals: 9/15  -Continue CIR therapies including PT, OT, and SLP              -WHO for RUE 2.  Antithrombotics: -DVT/anticoagulation:  Pharmaceutical: Lovenox             -antiplatelet therapy: DAPT X 3 months followed by ASA alone.  3. Pain: continue Tylenol prn.  CVA related pain developing tone in RIght finger an wrist flexors, cont ROM, premedicate with tylenol discussed with RN and OT- trial Voltaren gel   4. Mood/Behavior/Sleep: LCSW to follow for evaluation and support.              -antipsychotic agents: N/A 5. Neuropsych/cognition: This patient is not fully capable of making decisions on his own behalf due to aphasia. 6. Skin/Wound Care: Routine pressure relief measures.  7. Fluids/Electrolytes/Nutrition: Monitor I/O. Check CMET in am for follow up on AKI/LFTs.         HypoK+ supplement and recheck K+ - WNL    Latest Ref Rng & Units 02/25/2022    6:38 AM 02/21/2022    7:54 AM 02/18/2022    5:05 AM  BMP  Glucose 70 - 99 mg/dL 02/20/2022   341   BUN 8 - 23 mg/dL 24   26   Creatinine 962 - 1.24 mg/dL 2.29   7.98   Sodium  9.21 - 145 mmol/L 134   142   Potassium 3.5 - 5.1 mmol/L 4.2  4.2  3.2   Chloride 98 - 111 mmol/L 97   106   CO2 22 - 32 mmol/L 26   28   Calcium 8.9 - 10.3  mg/dL 8.5   8.5     HypoK+ will supplement , not on diuretics but had several stools on 8/27  9/2- last K+ 4.2- doing well 8. L-MCA infarct with hemorrhagic conversion: DAPT X 90 day followed by ASA alone 9. HTN: Monitor BP TID. BP remains labile. Continue Proscar.  --Continue lopressor to 12.5mg  BID given hypotension  Vitals:   03/02/22 0000 03/02/22 0433  BP:  (!) 128/58  Pulse:  69  Resp:  18  Temp:  (!) 97.5 F (36.4 C)  SpO2: 95% 100%    10. Dysphagia: Continue D1, honey thick liquids. Needs assistance for feeding and supervision for safety.  --hypernatremia/AKI likely due to dysphagia diet 11. BPH: Monitor for any voiding difficulties. Will order PVR checks as off his meds.  --Proscar was not resumed. Flomax was d/c on 08/20.  12. Pre-renal azotemia: Offer fluids between meals.  --Indicated thirst -->drank honey liquids without negative expression.  -supplement with IVF if needed 9/2- will recheck Monday and see if doing better- last BUN 26- improving 9/4 no BMET ordered for today--check BMET 9/5 13. Covid + asymptomatic,WBC nl.  monitor for cough fever, sore throat, no antiviral unless symptomatic, maintain isolation in room 5-7 d per ID, N95 masking in room  --continue to monitor for any signs of infection/aspiration.     Latest Ref Rng & Units 02/27/2022    5:35 AM 02/26/2022    6:42 AM 02/25/2022    6:38 AM  CBC  WBC 4.0 - 10.5 K/uL 9.4  11.1  17.6   Hemoglobin 13.0 - 17.0 g/dL 74.1  28.7  86.7   Hematocrit 39.0 - 52.0 % 32.4  34.4  34.5   Platelets 150 - 400 K/uL 345  340  333    9/6 WBC trending down Afeb for ~48h Per ID Dr Drue Second off precautions on 9/8 14. Apneic episodes on continuous pulse ox while sleeping, does recover on own, consulted RT    LOS: 13 days A FACE TO FACE EVALUATION WAS  PERFORMED  Paul Bradshaw Paul Bradshaw 03/02/2022, 11:02 AM

## 2022-03-02 NOTE — Progress Notes (Signed)
Occupational Therapy Session Note  Patient Details  Name: Paul Bradshaw MRN: [id number] Date of Birth: Sep 12, 1925  Today's Date: 03/02/2022 OT Individual Time: 6073-7106, 100-145 OT Individual Time Calculation (min): 47 min , 45 Mins   Short Term Goals: Week 1:  OT Short Term Goal 1 (Week 1): Pt will be able to sit to stand to prep for LB dressing/ toileting with min A. OT Short Term Goal 1 - Progress (Week 1): Not progressing OT Short Term Goal 2 (Week 1): Pt will be able to stand pivot to [facility] with mod A. OT Short Term Goal 2 - Progress (Week 1): Not progressing OT Short Term Goal 3 (Week 1): Pt will be able to stand upright with min A to increase safety with standing during clothing management. OT Short Term Goal 3 - Progress (Week 1): Not progressing OT Short Term Goal 4 (Week 1): Pt will don shirt with mod A. OT Short Term Goal 4 - Progress (Week 1): Not progressing OT Short Term Goal 5 (Week 1): Pt will demonstrate awareness of RUE by moving R arm into position with LUE when cued. OT Short Term Goal 5 - Progress (Week 1): Not progressing Week 2:  OT Short Term Goal 1 (Week 2): Pt will be able to sit to EOB with mod A to prep for self care. OT Short Term Goal 2 (Week 2): Pt will be able to sit to stand from EOB with mod A to prep transfer. OT Short Term Goal 3 (Week 2): Pt will tolerate standing with mod A to enable caregives to A him with pericare post toileting. OT Short Term Goal 4 (Week 2): Pt will transfer to [facility] with mod A.  Skilled Therapeutic Interventions/Progress Updates:    OT Treatment Session (1): Patient in bed asleep upon arrival, PCA came in and indicated that she attempted to awaken him for breakfast, but she was met with challenges. Patient very lethargic this AM,  however, I was able to get him to come from supine to the EOB, incorporating the grab bar to assist with positioning, as well as, wash his face and hands with a face cloth.  The pt went on to tolerate manual  massage of his R scapular region and his RUE to improve communication to the joint in preparation for mobility.  The pt was able to tolerate AROM/PROM of BUE with opportunity to weight bear through the extremity for gains with  functional mobility of BUE as an assist and prime mover. The pt  was engaged in self feeding using his LUE for bring food to his mouth  with a spoon and managing his drink container, .nursing came in at the end of the treatment session was able to offer further assistance to the pt in self feeding.   At the end of treatment, the pt's call light and bedside table were place within reach.  The pt had no c/o pain this with all additional needs addressed.   OT treatment Session (2):  The pt was trasferred from bed LOF to w/c with [place] for functional transfer.  The arm tray was in place for support of the RUE. Lunch trays were out so the pt was assisted during meal time , the pt presents with challenges bring his food to his mouth and engaging in radial deviation for  placing the food in his mouth, I red tubing was used to adapt his utensil to assist with grasp and improve his ability to feed himself with  increase independence. The pt was able to demonstrate effective carryover following hand over hand demonstration and vc's with 65% accuracy in relation to placement. At the end of treatment, the pt remained at w/c LOF with his bedside table and call light nearby.  Nursing was informed that he was seated in the w/c with his alarm in place.  Therapy Documentation Precautions:  Precautions Precautions: Fall Precaution Comments: R hemipareisis, expressive>receptive aphasia Restrictions Weight Bearing Restrictions: No   Therapy/Group: Individual Therapy  Yvonne Kendall 03/02/2022, 4:10 PM

## 2022-03-02 NOTE — Progress Notes (Signed)
Physical Therapy Session Note  Patient Details  Name: Paul Bradshaw MRN: [id number] Date of Birth: 17-Nov-1925  Today's Date: 03/02/2022 PT Individual Time: 5809-9833 PT Individual Time Calculation (min): 34 min  and Today's Date: 03/02/2022 PT Missed Time: 11 Minutes Missed Time Reason: Patient unwilling to participate  Short Term Goals: Week 2:  PT Short Term Goal 1 (Week 2): Pt will perform bed mobility with overall CGA and using no bed features. PT Short Term Goal 2 (Week 2): Pt will perform all functional transfers with light MinA/ CGA and LRAD. PT Short Term Goal 3 (Week 2): Pt will ambulate at least 40 ft using LRAD with CGA/ MinA. PT Short Term Goal 4 (Week 2): Pt will initiate stair training.  Skilled Therapeutic Interventions/Progress Updates:      Therapy Documentation Precautions:  Precautions Precautions: Fall Precaution Comments: R hemipareisis, expressive>receptive aphasia Restrictions Weight Bearing Restrictions: No  Pt received semi-reclined in bed. Pt with aphasia impairments and provided with yes/no questions and additional time and pt unable to engage or communicate wants. PT also utilized Public affairs consultant and pt did not provide responses. Pt incontinent of bladder and required min A for rolling in bilateral directions and was dependent for peri-care. Pt able to demonstrate bridge as PT dependently donned shorts. Pt required mod A with supine to sit and pt CGA/min A for static sitting balance edge of bed. Pt asked if he wanted to get to chair and was unable to communicate response. PT started set-up for transfer to chair and pt performed sit to supine (S). Pt nodded head to communicate he wanted to get back to bed and did not want to participate in therapy. Pt required +2 to scoot up in bed as pt unable to bridge to shift hips higher in bed. Pt left semi-reclined in bed with bed alarm on and all needs within reach.    Therapy/Group: Individual Therapy  Truitt Leep Truitt Leep PT, DPT  03/02/2022, 7:43 AM

## 2022-03-03 LAB — CBC
HCT: 35.1 % — ABNORMAL LOW (ref 39.0–52.0)
Hemoglobin: 11.6 g/dL — ABNORMAL LOW (ref 13.0–17.0)
MCH: 31.5 pg (ref 26.0–34.0)
MCHC: 33 g/dL (ref 30.0–36.0)
MCV: 95.4 fL (ref 80.0–100.0)
Platelets: 395 10*3/uL (ref 150–400)
RBC: 3.68 MIL/uL — ABNORMAL LOW (ref 4.22–5.81)
RDW: 11.5 % (ref 11.5–15.5)
WBC: 7.3 10*3/uL (ref 4.0–10.5)
nRBC: 0 % (ref 0.0–0.2)

## 2022-03-03 LAB — BASIC METABOLIC PANEL
Anion gap: 7 (ref 5–15)
BUN: 25 mg/dL — ABNORMAL HIGH (ref 8–23)
CO2: 27 mmol/L (ref 22–32)
Calcium: 9 mg/dL (ref 8.9–10.3)
Chloride: 100 mmol/L (ref 98–111)
Creatinine, Ser: 0.83 mg/dL (ref 0.61–1.24)
GFR, Estimated: >60 mL/min (ref >60)
Glucose, Bld: 128 mg/dL — ABNORMAL HIGH (ref 70–99)
Potassium: 3.8 mmol/L (ref 3.5–5.1)
Sodium: 134 mmol/L — ABNORMAL LOW (ref 135–145)

## 2022-03-03 NOTE — Progress Notes (Signed)
Physical Therapy Session Note  Patient Details  Name: Paul Bradshaw MRN: [id number] Date of Birth: Dec 14, 1925  Today's Date: 03/03/2022 PT Individual Time: 2703-5009 PT Individual Time Calculation (min): 60 min   Short Term Goals: Week 1:  PT Short Term Goal 1 (Week 1): Pt will perform bed mobility with overall CGA and using no bed features. PT Short Term Goal 1 - Progress (Week 1): Progressing toward goal PT Short Term Goal 2 (Week 1): Pt will perform all functional transfers with light MinA/ CGA and LRAD. PT Short Term Goal 2 - Progress (Week 1): Progressing toward goal PT Short Term Goal 3 (Week 1): Pt will ambulate at least 40 ft using LRAD with CGA/ MinA. PT Short Term Goal 3 - Progress (Week 1): Progressing toward goal PT Short Term Goal 4 (Week 1): Pt will initiate stair training. PT Short Term Goal 4 - Progress (Week 1): Progressing toward goal PT Short Term Goal 5 (Week 1): Pt will perform Berg Balance test. PT Short Term Goal 5 - Progress (Week 1): Progressing toward goal Week 2:  PT Short Term Goal 1 (Week 2): Pt will perform bed mobility with overall CGA and using no bed features. PT Short Term Goal 2 (Week 2): Pt will perform all functional transfers with light MinA/ CGA and LRAD. PT Short Term Goal 3 (Week 2): Pt will ambulate at least 40 ft using LRAD with CGA/ MinA. PT Short Term Goal 4 (Week 2): Pt will initiate stair training.  Skilled Therapeutic Interventions/Progress Updates:  Patient supine in bed on entrance to room. Patient alert and agreeable to PT session. NT checking brief for incontinence then hand off to therapist for session.   Patient with no pain complaint at start of session.  Therapeutic Activity: Bed Mobility: Pt performed assisted with LB dressing while supine in bed by lifting feet to initiate threading. Is able to hold BLE up at same time. Then performs bridging with hold to R ankle in order to facilitate pull up of shorts with modA. Supine --> sit  EOB with Min/ ModA and significant abdominal effort. Once seated EOB, requires MinA to square self and scoot forward with extra time to bring Bil feet to floor. Simple vc/ tc required for initiation/ technique. Sits EOB with feet on floor with supervision.  Transfers: Pt performed sit<>stand and stand pivot transfers throughout session with Min/ ModA for balance and RW mgmt. Requires heavy vc/ tc for technique and sequencing.   Gait Training:  Pt ambulated 12 ft using RW with MinA for upright posture and balance and ModA for RW mgmt as pt tending to push forward with LUE and inability to control R side with RUE. Demonstrated slow pace with hesitation in lift of RLE. Facilitated weight shift to improve sequencing of reciprocal activation. Provided vc/ tc for increasing step length with RLE, upright posture, forward gaze.  Wheelchair Mobility:  Pt propelled wheelchair 50 feet with BLE requiring supervision most of trip and MinA to initiate with front wheels turned to side. Provided vc for effort/ technique.  Neuromuscular Re-ed: NMR facilitated during session with focus onstanding balance, midline orientation/ proprioception and upright body position. Pt guided in toe touches to 3" step with use of BHR for support and ModA to RUE to maintain. Pt with difficulty in following vc only for specific technique required and needs visual demo. Fatigue on RLE with increased difficulty and need for AAROM to perform.   In // bars, attempt for lateral stepping with pt  able to stand, then turn to face bar, then turning back to sit in w/c. Guided in step outs and toe touches to targets for BLE while seated in order to improve motor control and muscle activation.  NMR performed for improvements in motor control and coordination, balance, sequencing, judgement, and self confidence/ efficacy in performing all aspects of mobility at highest level of independence.   Patient seated in w/c at end of session with brakes  locked, belt alarm set, and all needs within reach. NT notified to check with pt consistently for any needs.    Therapy Documentation Precautions:  Precautions Precautions: Fall Precaution Comments: R hemipareisis, expressive>receptive aphasia Restrictions Weight Bearing Restrictions: No General:   Vital Signs: Therapy Vitals Temp: 98.2 F (36.8 C) Pulse Rate: 88 Resp: 15 BP: 114/67 Patient Position (if appropriate): Lying Oxygen Therapy SpO2: 100 % O2 Device: Room Air Pain: Pain Assessment Pain Score: 0-No pain   Therapy/Group: Individual Therapy  Loel Dubonnet PT, DPT, CSRS 03/03/2022, 5:06 PM

## 2022-03-03 NOTE — Progress Notes (Signed)
PROGRESS NOTE   Subjective/Complaints: Remains aphasic  Off airborne precautions    ROS: Limited due to language/communication   Objective:   No results found. No results for input(s): "WBC", "HGB", "HCT", "PLT" in the last 72 hours.   No results for input(s): "NA", "K", "CL", "CO2", "GLUCOSE", "BUN", "CREATININE", "CALCIUM" in the last 72 hours.   Intake/Output Summary (Last 24 hours) at 03/03/2022 0750 Last data filed at 03/03/2022 0738 Gross per 24 hour  Intake 610 ml  Output --  Net 610 ml      Pressure Injury 02/07/22 Coccyx Medial Stage 1 -  Intact skin with non-blanchable redness of a localized area usually over a bony prominence. (Active)  02/07/22 1327  Location: Coccyx  Location Orientation: Medial  Staging: Stage 1 -  Intact skin with non-blanchable redness of a localized area usually over a bony prominence.  Wound Description (Comments):   Present on Admission: Yes    Physical Exam: Vital Signs Blood pressure (!) 134/59, pulse 66, temperature 98 F (36.7 C), temperature source Oral, resp. rate 16, height 5\' 5"  (1.651 m), weight 64.4 kg, SpO2 97 %.   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 Does nod Y/N. Moving BL LE and LUE in bed. Flaccid RUE. Does not follow commands for strength testing consistently.  Responsive and localizing to pain except in RUE.       Assessment/Plan: 1. Functional deficits which require 3+ hours per day of interdisciplinary therapy in a comprehensive inpatient rehab setting. Physiatrist is providing close team supervision and 24 hour management of active medical problems listed below. Physiatrist and rehab team continue to assess barriers to discharge/monitor patient progress toward functional and medical goals  Care Tool:  Bathing    Body parts bathed by patient: Chest, Face, Abdomen, Right upper leg, Left upper leg   Body parts bathed by helper: Right arm, Left arm, Buttocks, Front perineal area, Right lower leg, Left lower leg     Bathing assist Assist Level: Moderate Assistance - Patient 50 - 74%     Upper Body Dressing/Undressing Upper body dressing   What is the patient wearing?: Pull over shirt    Upper body assist Assist Level: Moderate Assistance - Patient 50 - 74%    Lower Body Dressing/Undressing Lower body dressing      What is the patient wearing?: Incontinence brief, Pants     Lower body assist Assist for lower body dressing: Maximal Assistance - Patient 25 - 49%     Toileting Toileting    Toileting assist Assist for toileting: Total Assistance - Patient < 25%     Transfers Chair/bed transfer  Transfers assist  Chair/bed transfer activity did not  occur: Safety/medical concerns  Chair/bed transfer assist level: Moderate Assistance - Patient 50 - 74%     Locomotion Ambulation   Ambulation assist   Ambulation activity did not occur: Safety/medical concerns          Walk 10 feet activity   Assist  Walk 10 feet activity did not occur: Safety/medical concerns        Walk 50 feet activity   Assist Walk 50 feet with 2 turns activity did not occur: Safety/medical concerns         Walk 150 feet activity   Assist Walk 150 feet activity did not occur: Safety/medical concerns         Walk 10 feet on uneven surface  activity   Assist Walk 10 feet  on uneven surfaces activity did not occur: Safety/medical concerns         Wheelchair     Assist Is the patient using a wheelchair?: Yes (Did not use one PTA) Type of Wheelchair: Manual Wheelchair activity did not occur: Safety/medical concerns         Wheelchair 50 feet with 2 turns activity    Assist    Wheelchair 50 feet with 2 turns activity did not occur: Safety/medical concerns       Wheelchair 150 feet activity     Assist  Wheelchair 150 feet activity did not occur: Safety/medical concerns       Blood pressure (!) 134/59, pulse 66, temperature 98 F (36.7 C), temperature source Oral, resp. rate 16, height 5\' 5"  (1.651 m), weight 64.4 kg, SpO2 97 %.  Medical Problem List and Plan: 1. 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 Extension - check repeat CT head             -patient may shower             -ELOS/Goals: 9/15  -Continue CIR therapies including PT, OT, and SLP              -WHO for RUE 2.  Antithrombotics: -DVT/anticoagulation:  Pharmaceutical: Lovenox             -antiplatelet therapy: DAPT X 3 months followed by ASA alone.  3. Pain: continue Tylenol prn.  CVA related pain developing tone in RIght finger an wrist flexors, cont ROM, premedicate with tylenol discussed with RN and OT- trial Voltaren gel   4. Mood/Behavior/Sleep: LCSW to follow for evaluation and support.              -antipsychotic agents: N/A 5. Neuropsych/cognition: This patient is not fully capable of making decisions on his own behalf due to aphasia. 6. Skin/Wound Care: Routine pressure relief measures.  7. Fluids/Electrolytes/Nutrition: Monitor I/O         HypoK+ resolved on supplements     Latest Ref Rng & Units 02/25/2022    6:38 AM 02/21/2022    7:54 AM 02/18/2022    5:05 AM  BMP  Glucose 70 - 99 mg/dL 02/20/2022   035   BUN 8 - 23 mg/dL 24   26   Creatinine 465 - 1.24 mg/dL 6.81    2.75   Sodium 1.70 - 145 mmol/L 134   142   Potassium 3.5 - 5.1 mmol/L 4.2  4.2  3.2   Chloride 98 - 111 mmol/L 97   106   CO2 22 - 32 mmol/L 26   28   Calcium 8.9 -  10.3 mg/dL 8.5   8.5     HypoK+ will supplement , not on diuretics but had several stools on 8/27  9/2- last K+ 4.2- doing well 8. L-MCA infarct with hemorrhagic conversion: DAPT X 90 day followed by ASA alone 9. HTN: Monitor BP TID. BP remains labile. Continue Proscar.  --Continue lopressor to 12.5mg  BID given hypotension  Vitals:   03/02/22 1829 03/03/22 0440  BP: (!) 118/56 (!) 134/59  Pulse: 80 66  Resp: 18 16  Temp: 97.8 F (36.6 C) 98 F (36.7 C)  SpO2: 100% 97%    10. Dysphagia: Continue D1, honey thick liquids. Needs assistance for feeding and supervision for safety.  --hypernatremia/AKI likely due to dysphagia diet 11. BPH: Monitor for any voiding difficulties. Will order PVR checks as off his meds.  --Proscar was not resumed. Flomax was d/c on 08/20.   13. Covid + asymptomatic,WBC nl.  monitor for cough fever, sore throat, no antiviral unless symptomatic, maintain isolation in room 5-7 d per ID, N95 masking in room  --continue to monitor for any signs of infection/aspiration.     Latest Ref Rng & Units 02/27/2022    5:35 AM 02/26/2022    6:42 AM 02/25/2022    6:38 AM  CBC  WBC 4.0 - 10.5 K/uL 9.4  11.1  17.6   Hemoglobin 13.0 - 17.0 g/dL 16.1  09.6  04.5   Hematocrit 39.0 - 52.0 % 32.4  34.4  34.5   Platelets 150 - 400 K/uL 345  340  333   Leukocytosis resolved  14. Apneic episodes on continuous pulse ox while sleeping, does recover on own, consulted RT - aphasic cannot ask about am HA or other symptoms    LOS: 14 days A FACE TO FACE EVALUATION WAS PERFORMED  Erick Colace 03/03/2022, 7:50 AM

## 2022-03-03 NOTE — Progress Notes (Signed)
Speech Language Pathology Daily Session Note  Patient Details  Name: Paul Bradshaw MRN: [id number] Date of Birth: Dec 14, 1925  Today's Date: 03/03/2022 SLP Individual Time: 1032-1100 SLP Individual Time Calculation (min): 28 min  Short Term Goals: Week 2: SLP Short Term Goal 1 (Week 2): STG=LTG due to ELOS  Skilled Therapeutic Interventions:Skilled ST services focused on language skills. SLP facilitated response to yes/no questions pertaining to immediate environment/biographical information, pt demonstrated "yes" accurately/consistently with a head nod (80%), but "no" has no clear gesture only facial expression of confusion. Pt was not stimuable for initial /m/ , /b/ and /p/ CV words given picture cards and modeling. Pt could write 2/5 letters in his last name and was able to trace first name. Pt was able to follow 1 step commands and identifying/correct missing items in picture (ex: added missing eye on cat and missing wheel on car.) Pt verbalized intelligible sounds x2. Pt was left in room with call bell within reach and chair alarm set. SLP recommends to continue skilled services.      Pain Pain Assessment Pain Score: 0-No pain  Therapy/Group: Individual Therapy  Deisi Salonga  [facility] 03/03/2022, 1:28 PM

## 2022-03-03 NOTE — Progress Notes (Signed)
Nutrition Follow-up  DOCUMENTATION CODES:   Severe malnutrition in context of acute illness/injury  INTERVENTION:  Continue current diet per SLP, advance as able Feeding assistance Encourage PO intake Magic cup TID with meals, each supplement provides 290 kcal and 9 grams of protein MVI with minerals New measured weight   NUTRITION DIAGNOSIS:   Severe Malnutrition (in the context of acute illness) related to  (inadequate energy intake) as evidenced by moderate fat depletion, moderate muscle depletion, percent weight loss (11% x 1 month). - remains applicable  GOAL:   Patient will meet greater than or equal to 90% of their needs - progressing, diet and supplements in place  MONITOR:   PO intake, Labs, I & O's, Supplement acceptance, Diet advancement  REASON FOR ASSESSMENT:   Malnutrition Screening Tool    ASSESSMENT:   Pt with hx of HTN, HLD, pre-diabetes, and hx of CVAs admitted to [facility] after presenting to [facility] on 8/18 with right sided weakness and inability to talk. 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  Communication remains difficult due to aphasia. Discussed intake with LPN who reports that intake has been variable but improved today. Seems to eat better when family is present. Reports that pt does like the magic cups he is receiving.   Reviewed intake in flowsheet and appears overall stable from last assessment with average ~60%. Will continue current nutrition plan and request an updated weight to assess for weight loss.   Average Meal Intake: 8/28-9/3: 67% intake x 22 recorded meals 9/4-9/11: 59% intake x 23 recorded meals  Nutritionally Relevant Medications: Scheduled Meds:  atorvastatin  10 mg Oral q1800   pantoprazole  40 mg Oral Daily   Labs Reviewed  NUTRITION - FOCUSED PHYSICAL EXAM:  Flowsheet Row Most Recent Value  Orbital Region Moderate depletion  Upper Arm Region Moderate depletion  Thoracic and Lumbar Region Mild depletion  Buccal Region  Moderate depletion  Temple Region Moderate depletion  Clavicle Bone Region Moderate depletion  Clavicle and Acromion Bone Region Mild depletion  Scapular Bone Region Mild depletion  Dorsal Hand Mild depletion  Patellar Region Severe depletion  Anterior Thigh Region Severe depletion  Posterior Calf Region Severe depletion  Edema (RD Assessment) None  Hair Reviewed  Eyes Reviewed  Mouth Reviewed  Skin Reviewed  Nails Reviewed    Diet Order:   Diet Order             DIET - DYS 1 Room service appropriate? Yes; Fluid consistency: Honey Thick  Diet effective now                   EDUCATION NEEDS:   Not appropriate for education at this time  Skin:  Skin Assessment: Skin Integrity Issues: Skin Integrity Issues:: Stage I Stage I: perineum area  Last BM:  9/8 - type 2  Height:   Ht Readings from Last 1 Encounters:  02/17/22 5\' 5"  (1.651 m)    Weight:   Wt Readings from Last 1 Encounters:  02/17/22 64.4 kg    Ideal Body Weight:  61.8 kg  BMI:  Body mass index is 23.63 kg/m.  Estimated Nutritional Needs:  Kcal:  1600-1800 kcal/d Protein:  80-95g/d Fluid:  1.8-2L/d    02/19/22, RD, LDN Clinical Dietitian RD pager # available in [facility]  After hours/weekend pager # available in [facility]

## 2022-03-03 NOTE — Progress Notes (Addendum)
Patient ID: Paul Bradshaw, male   DOB: [birth year], 88 y.o.   MRN: [id number]  [facility] ordered through [employer]

## 2022-03-03 NOTE — Progress Notes (Signed)
Occupational Therapy Session Note  Patient Details  Name: Paul Bradshaw MRN: [id number] Date of Birth: 03-21-1926  Today's Date: 03/03/2022 OT Individual Time: 1300-1345 OT Individual Time Calculation (min): 45 min    Short Term Goals: Week 1:  OT Short Term Goal 1 (Week 2): Pt will be able to sit to EOB with mod A to prep for self care. OT Short Term Goal 2 (Week 2): Pt will be able to sit to stand from EOB with mod A to prep transfer. OT Short Term Goal 3 (Week 2): Pt will tolerate standing with mod A to enable caregives to A him with pericare post toileting. OT Short Term Goal 4 (Week 2): Pt will transfer to [facility] with mod A.  Skilled Therapeutic Interventions/Progress Updates:    Patient agreeable to participate in OT session. Indicated right shoulder pain intermittently felt during passive movement and mobility during session. [name] and wife present during session.   Patient participated in skilled OT session focusing on NM re-ed to RUE. Therapist facilitated RUE P/ROM and AA/ROM seated on EOB in order to facilitate active RUE movement and functional use during self care tasks and functional transfers. Pt completed passive shoulder flexion, extension, wrist flexion, extension, supination, pronation, composite finger flexion/extension, 10X, 1 set. Joint mobilizations performed to right hand MCP joints to increase joint mobility. Pt able to demonstrate 1/5 (trace) muscle activation during shoulder flexion and held isometric hold for 5 seconds, 1X. Unable to demonstrate muscle activation more than once. VC and visual demonstration provided for form and technique of each exercise. Therapist attempted to elicit more muscle activation of the RUE during session although was unsuccessful. Pt completed sqt pvt transfer from w/c to bed with Mod A. Physical cues to lean forward prior to lift off in order to transfer weight onto toes and allow buttocks to cleat chair. Max assist provided to scoot towards [facility]  prior to transitioning from sitting to supine which pt was provided with total assist in order to complete with correct form and technique. Proper positioning of RUE was completed using pillow. OT contacted case manager per Son's request asking to speak with her to receive assist and information on becoming a POA.    Therapy Documentation Precautions:  Precautions Precautions: Fall Precaution Comments: R hemipareisis, expressive>receptive aphasia Restrictions Weight Bearing Restrictions: No    Therapy/Group: Individual Therapy  Limmie Patricia, OTR/L,CBIS  Supplemental OT - [facility] and [facility]  03/03/2022, 12:59 PM

## 2022-03-04 NOTE — Progress Notes (Addendum)
Speech Language Pathology Daily Session Note  Patient Details  Name: Paul Bradshaw MRN: [id number] Date of Birth: Nov 17, 1925  Today's Date: 03/04/2022 SLP Individual Time: 1120-1200 SLP Individual Time Calculation (min): 40 min  Short Term Goals: Week 2: SLP Short Term Goal 1 (Week 2): STG=LTG due to ELOS  Skilled Therapeutic Interventions: S: Pt seen this date for skilled ST intervention targeting dysphagia and communication/language goals outlined in care plan. Pt received awake, though visibly fatigued following PT session; OOB in w/c. Pt's son's fiance present for the majority of today's session. Continuous pulse ox donned and remained at 100% throughout. Flat affect. Agreeable to ST intervention in hospital room via head nod.   O: SLP facilitated today's session by providing: - Max to Total Paul for following contextual, one-step commands.  - No verbal output of single phonemes nor automatic speech attempts (e.g. counting to 3 and singing familiar song) despite Max Paul multimodal cues and extended processing time/wait time. - Responded to basic and personal yes/no questions via head nod/shake on ~80% of opportunities given Min Paul. Pt was noted to shake his head "no" this date, which appears to be relatively new (prior, pt did not respond unless answer was "yes"). - Total Paul for receptive ID of common objects in field of 2 on 3 out of 3 trials, and Total Paul to produce movement to demonstrate function of item (e.g. brush his hair with the comb). - Max Paul for self-feeding/intake of Dysphagia 1 textures and honey-thick liquids via cup; no clinical s/sx concerning for airway invasion with current diet textures. Swallow initiation was noted with both textures, though oral phase appears prolonged and delayed with pureed textures in comparison to honey-thick liquids. Minimal oral residuals post-swallow which cleared with liquid rinse. Mild anterior labial spillage on R. - Total Paul for oral care after PO  intake.  Paul: Pt did not appear sitmulable for skilled ST interventions r/t verbal output, verbal + motor imitation, visual action therapy, use of low-tech AAC, or articulatory placement. Pt primary means of communication remains facial expressions and non-verbal response to yes/no questions, though are not always reliable. Recommend continuation of current diet (Dysphagia 1 and honey-thick liquid via cup) at this time.  P: Pt left OOB in w/c with all safety measures activated. Pt's son's fiance present at the end of today's session. All immediate needs met. Continue per current ST POC next session. Will plan to complete MBSS to further assess pt's swallow function and readiness for upgrading liquid consistencies.  Pain Pain Assessment Pain Scale: PAINAD PAINAD (Pain Assessment in Advanced Dementia) Breathing: normal Negative Vocalization: none Facial Expression: smiling or inexpressive Body Language: relaxed Consolability: no need to console PAINAD Score: 0  Therapy/Group: Individual Therapy  Paul Bradshaw Paul Bradshaw 03/04/2022, 12:43 PM

## 2022-03-04 NOTE — Progress Notes (Signed)
Physical Therapy Session Note  Patient Details  Name: Paul Bradshaw MRN: [id number] Date of Birth: Jan 01, 1926  Today's Date: 03/04/2022 PT Individual Time: 1031-1130 PT Individual Time Calculation (min): 59 min   Short Term Goals: Week 1:  PT Short Term Goal 1 (Week 1): Pt will perform bed mobility with overall CGA and using no bed features. PT Short Term Goal 1 - Progress (Week 1): Progressing toward goal PT Short Term Goal 2 (Week 1): Pt will perform all functional transfers with light MinA/ CGA and LRAD. PT Short Term Goal 2 - Progress (Week 1): Progressing toward goal PT Short Term Goal 3 (Week 1): Pt will ambulate at least 40 ft using LRAD with CGA/ MinA. PT Short Term Goal 3 - Progress (Week 1): Progressing toward goal PT Short Term Goal 4 (Week 1): Pt will initiate stair training. PT Short Term Goal 4 - Progress (Week 1): Progressing toward goal PT Short Term Goal 5 (Week 1): Pt will perform Berg Balance test. PT Short Term Goal 5 - Progress (Week 1): Progressing toward goal Week 2:  PT Short Term Goal 1 (Week 2): Pt will perform bed mobility with overall CGA and using no bed features. PT Short Term Goal 2 (Week 2): Pt will perform all functional transfers with light MinA/ CGA and LRAD. PT Short Term Goal 3 (Week 2): Pt will ambulate at least 40 ft using LRAD with CGA/ MinA. PT Short Term Goal 4 (Week 2): Pt will initiate stair training.   Skilled Therapeutic Interventions/Progress Updates:  Patient supine in bed asleep on entrance to room. DIL in room. Patient takes time to fully wake and become alert and agreeable to PT session.   Patient with no pain complaint at start of session.  Therapeutic Activity: Bed Mobility: Pt still in hospital gown and requires assist for daily dressing. Pt provides assist with lift of legs for Mod/ Max threading of BLE into pant legs. Decreased assist provided by pt this day compared to previous day. Pt performed supine --> sit with Mod/ maxA and  vc/ tc throughout for sidelying technique. Increased LOA to produce forward scoot with Mod/ MaxA. With feet on floor, pt's sitting balance improves to supervision. Donning undershirt and button shirt with MaxA and vc for sequencing for pt as well as DIL for education.   Transfers: Pt performed sit<>stand and stand pivot transfers throughout session requiring excessive cueing and Mod/ Max A for initiation and sequencing. With adjustment of w/c to pt's L side, pt stand pivots to w/c he can see with MinA for RW mgmt. Pt noted to shake head indicating that he cannot produce pivot stepping throughout attempts. Provided verbal cues for sequencing and technique.  Neuromuscular Re-ed: NMR facilitated during session with focus on sitting/ standing balance. Pt guided in ability to produce forward lean in order to produce effective forward scoot. Once standing, pt requires vc/ tc with education for upright posture, weight shifting laterally in order to move feet. NMR performed for improvements in motor control and coordination, balance, sequencing, judgement, and self confidence/ efficacy in performing all aspects of mobility at highest level of independence.   Patient seated upright at end of session with brakes locked, belt alarm set, and all needs within reach. DIL in room. Handing phone to therapist to discuss pt's status with son. Son requesting for more time in order to attempt to have pt reach LOA that son and fiancee can provide in order to bring him home at d/c.  Therapy Documentation Precautions:  Precautions Precautions: Fall Precaution Comments: R hemipareisis, expressive>receptive aphasia Restrictions Weight Bearing Restrictions: No General:   Vital Signs:   Pain: Pain noted via pt's facial grimace of pain in R wrist during session.  Therapy/Group: Individual Therapy  Loel Dubonnet PT, DPT, CSRS 03/04/2022, 8:02 AM

## 2022-03-04 NOTE — Progress Notes (Signed)
Patient ID: Paul Bradshaw, male   DOB: 08/22/1925, 88 y.o.   MRN: [id number]  Chaplain request put in over the phone for patient son, Casimiro Needle.

## 2022-03-04 NOTE — Progress Notes (Signed)
Occupational Therapy Session Note  Patient Details  Name: Paul Bradshaw MRN: [id number] Date of Birth: 28-Dec-1925  Today's Date: 03/04/2022 OT Individual Time: 1749-4496 OT Individual Time Calculation (min): 64 min    Short Term Goals: Week 2:  OT Short Term Goal 1 (Week 2): Pt will be able to sit to EOB with mod A to prep for self care. OT Short Term Goal 2 (Week 2): Pt will be able to sit to stand from EOB with mod A to prep transfer. OT Short Term Goal 3 (Week 2): Pt will tolerate standing with mod A to enable caregives to A him with pericare post toileting. OT Short Term Goal 4 (Week 2): Pt will transfer to [facility] with mod A.  Skilled Therapeutic Interventions/Progress Updates:  Pt awake up in w/c upon OT arrival to the room. Pt shakes head "no" when offered a shower secondary to increased fatigue this afternoon. Pt in agreement for OT session.  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  Pain: Pain Assessment Pain Scale: 0-10 Pain Score: 0-No pain  ADL: Upper Body Bathing: Minimal assistance, Moderate cueing (Pt able to bathe 4/5 UB parts requiring assistance to bathe LUE and moderate VC's for sequencing and thoroughness. Pt requires VC's/tactile cues to not perseverate on washing face/head while seated on tub-bench in the shower.) Where Assessed-Upper Body Bathing: Shower Lower Body Bathing: Moderate assistance, Moderate cueing (Pt able to bathe 2.5/5 body parts while seated on tub-bench in the shower and standing with minimal assist with use of grab bars while therapist provides assist to bathe buttocks. Pt able to bathe B thighs & front peri-area with mod VCs for sequencing) Where Assessed-Lower Body Bathing: Shower Upper Body Dressing: Maximal assistance (Pt  requires maximal assistance to doff a pull-over and button up shirt while seated on tub-bench. OT provides hand-over-hand and VCs for education on hemi-dressing techniques. Pt dons a gown after shower with maximal assist due to fatigue.) Where Assessed-Upper Body Dressing:  (tub-bench) Lower Body Dressing: Maximal assistance (Pt requires maximal assistance to doff pants and brief while standing with minimal assist and use of grab bar in the shower while pulling clothing down.) Where Assessed-Lower Body Dressing:  (tub-bench) Toileting: Dependent (Pt noted to have incontinence of bowel in brief when OT assist with doffing pants/brief. After doffed, pt sits on tub-bench and then stands without prompts as pt continued to have BM Pt requires total assistance for hygiene in the shower & change brief.) Where Assessed-Toileting:  (shower) Film/video editor: Moderate assistance (Pt able to perform SPT from w/c <> tub-bench with moderate assist using grab bars for safety.) Film/video editor Method: Stand pivot Astronomer: Emergency planning/management officer, Grab bars ADL Comments: Pt initially shakes head "no" to performing shower, however, OT then takes pt in the bathroom to allow OT to point at the shower with pt then shaking head "yes". P tdemo's improved independence with bathing UB parts while in the shower. Pt noted to have BM in brief when therapist asists with doffing brief. OT then instructs pt to sit on tub-bench with pt standing back up. Pt noted to continue BM in the shower. Pt requires total assistance for incontinence hygiene in the shower after BM. Pt demo's improved stading balance in the shower using grab bars. OT assisted pt with donning brief and put on a gown at  end of session. Pt able to perform SPT to the L form w/c > EOB with moderate assistance.   Pt returned to bed at end of session. Pt left resting comfortably in bed with personal belongings and call light within reach, bed  alarm on and activated, bed in low position, 3 bed rails up, and comfort needs attended to.   Therapy/Group: Individual Therapy  Lavera Guise 03/04/2022, 4:31 PM

## 2022-03-04 NOTE — Progress Notes (Signed)
PROGRESS NOTE   Subjective/Complaints: Remains aphasic   ROS: Limited due to language/communication   Objective:   No results found. Recent Labs    03/03/22 0800  WBC 7.3  HGB 11.6*  HCT 35.1*  PLT 395     Recent Labs    03/03/22 0800  NA 134*  K 3.8  CL 100  CO2 27  GLUCOSE 128*  BUN 25*  CREATININE 0.83  CALCIUM 9.0     Intake/Output Summary (Last 24 hours) at 03/04/2022 0810 Last data filed at 03/04/2022 0755 Gross per 24 hour  Intake 295 ml  Output --  Net 295 ml      Pressure Injury 02/07/22 Coccyx Medial Stage 1 -  Intact skin with non-blanchable redness of a localized area usually over a bony prominence. (Active)  02/07/22 1327  Location: Coccyx  Location Orientation: Medial  Staging: Stage 1 -  Intact skin with non-blanchable redness of a localized area usually over a bony prominence.  Wound Description (Comments):   Present on Admission: Yes    Physical Exam: Vital Signs Blood pressure 131/66, pulse 67, temperature 98 F (36.7 C), temperature source Oral, resp. rate 20, height 5\' 5"  (1.651 m), weight 64.5 kg, SpO2 97 %.   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 Does nod Y/N. Moving BL LE and LUE in bed. Flaccid RUE. Does not follow commands for strength testing consistently.   Antigravity RLE     Responsive and localizing to pain except in  RUE.      Assessment/Plan: 1. Functional deficits which require 3+ hours per day of interdisciplinary therapy in a comprehensive inpatient rehab setting. Physiatrist is providing close team supervision and 24 hour management of active medical problems listed below. Physiatrist and rehab team continue to assess barriers to discharge/monitor patient progress toward functional and medical goals  Care Tool:  Bathing    Body parts bathed by patient: Chest, Face, Abdomen, Right upper leg, Left upper leg   Body parts bathed by helper: Right arm, Left arm, Buttocks, Front perineal area, Right lower leg, Left lower leg     Bathing assist Assist Level: Moderate Assistance - Patient 50 - 74%     Upper Body Dressing/Undressing Upper body dressing   What is the patient wearing?: Pull over shirt    Upper body assist Assist Level: Moderate Assistance - Patient 50 - 74%    Lower Body Dressing/Undressing Lower body dressing      What is the patient wearing?: Incontinence brief, Pants     Lower body assist Assist for lower body dressing: Maximal Assistance - Patient 25 - 49%     Toileting Toileting    Toileting assist  Assist for toileting: Total Assistance - Patient < 25%     Transfers Chair/bed transfer  Transfers assist  Chair/bed transfer activity did not occur: Safety/medical concerns  Chair/bed transfer assist level: Moderate Assistance - Patient 50 - 74%     Locomotion Ambulation   Ambulation assist   Ambulation activity did not occur: Safety/medical concerns          Walk 10 feet activity   Assist  Walk 10 feet activity did not occur: Safety/medical concerns        Walk 50 feet activity   Assist Walk 50 feet with 2 turns activity did not occur: Safety/medical concerns         Walk 150 feet activity   Assist Walk 150 feet activity did not occur: Safety/medical concerns         Walk 10 feet on uneven surface  activity   Assist Walk 10  feet on uneven surfaces activity did not occur: Safety/medical concerns         Wheelchair     Assist Is the patient using a wheelchair?: Yes (Did not use one PTA) Type of Wheelchair: Manual Wheelchair activity did not occur: Safety/medical concerns         Wheelchair 50 feet with 2 turns activity    Assist    Wheelchair 50 feet with 2 turns activity did not occur: Safety/medical concerns       Wheelchair 150 feet activity     Assist  Wheelchair 150 feet activity did not occur: Safety/medical concerns       Blood pressure 131/66, pulse 67, temperature 98 F (36.7 C), temperature source Oral, resp. rate 20, height 5\' 5"  (1.651 m), weight 64.5 kg, SpO2 97 %.  Medical Problem List and Plan: 1. 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  Antithrombotics: -DVT/anticoagulation:  Pharmaceutical: Lovenox             -antiplatelet therapy: DAPT X 3 months followed by ASA alone.  3. Pain: continue Tylenol prn.  CVA related pain developing tone in RIght finger an wrist flexors, cont ROM, premedicate with tylenol discussed with RN and OT- trial Voltaren gel   4. Mood/Behavior/Sleep: LCSW to follow for evaluation and support.              -antipsychotic agents: N/A 5. Neuropsych/cognition: This patient is not fully capable of making decisions on his own behalf due to aphasia. 6. Skin/Wound Care: Routine pressure relief measures.  7. Fluids/Electrolytes/Nutrition: Monitor I/O         HypoK+ resolved on supplements     Latest Ref Rng & Units 03/03/2022    8:00 AM 02/25/2022    6:38 AM 02/21/2022    7:54 AM  BMP  Glucose 70 - 99 mg/dL 04/23/2022  742    BUN 8 - 23 mg/dL 25  24    Creatinine 595 - 1.24 mg/dL 6.38  7.56    Sodium 4.33 - 145 mmol/L 134  134    Potassium 3.5 - 5.1  mmol/L 3.8  4.2  4.2   Chloride 98 - 111 mmol/L 100  97    CO2  22 - 32 mmol/L 27  26    Calcium 8.9 - 10.3 mg/dL 9.0  8.5      HypoK+ will supplement , not on diuretics but had several stools on 8/27  9/2- last K+ 4.2- doing well 8. L-MCA infarct with hemorrhagic conversion: DAPT X 90 day followed by ASA alone 9. HTN: Monitor BP TID. BP remains labile. Continue Proscar.  --Continue lopressor to 12.5mg  BID given hypotension  Vitals:   03/03/22 1942 03/04/22 0336  BP: (!) 113/56 131/66  Pulse: 90 67  Resp: 19 20  Temp: 98.5 F (36.9 C) 98 F (36.7 C)  SpO2: 97% 97%   Controlled 9/12 10. Dysphagia: Continue D1, honey thick liquids. Needs assistance for feeding and supervision for safety.  --hypernatremia/AKI likely due to dysphagia diet 11. BPH: Monitor for any voiding difficulties. Will order PVR checks as off his meds.  --Proscar was not resumed. Flomax was d/c on 08/20.   13. Covid + asymptomatic,finished 10d in room quarantine on 9/8    Latest Ref Rng & Units 03/03/2022    8:00 AM 02/27/2022    5:35 AM 02/26/2022    6:42 AM  CBC  WBC 4.0 - 10.5 K/uL 7.3  9.4  11.1   Hemoglobin 13.0 - 17.0 g/dL 84.1  66.0  63.0   Hematocrit 39.0 - 52.0 % 35.1  32.4  34.4   Platelets 150 - 400 K/uL 395  345  340   Leukocytosis resolved  14. Apneic episodes on continuous pulse ox while sleeping, does recover on own, consulted RT - aphasic cannot ask about am HA or other symptoms    LOS: 15 days A FACE TO FACE EVALUATION WAS PERFORMED  Erick Colace 03/04/2022, 8:10 AM

## 2022-03-04 NOTE — Progress Notes (Signed)
   03/04/22 1525  Clinical Encounter Type  Visited With Patient;Health care provider  Visit Type Initial  Referral From Social work  Consult/Referral To [facility] spoke with Nurse [place] to confirm patient's competence. and called Foye Clock, Child psychotherapist, regarding the limitations regarding establishing a HCPOA. Foye Clock indicated that while info was communicated to patient's son Casimiro Needle) regarding our inability to execute due to competency issues, additional questions were raised. Chaplain attempted to contact son and left a message on the mobile phone [phone] to call Jon Gills for discussion.   Jon Gills, Resident Chaplain [phone]

## 2022-03-05 ENCOUNTER — Inpatient Hospital Stay (HOSPITAL_COMMUNITY): Payer: Medicare Other

## 2022-03-05 NOTE — Progress Notes (Signed)
Modified Barium Swallow Progress Note  Patient Details  Name: EWELL BENASSI MRN: [id number] Date of Birth: 12-04-1925  Today's Date: 03/05/2022  Modified Barium Swallow completed.  Full report located under Chart Review in the Imaging Section.  Brief recommendations include the following:  Clinical Impression  Pt was seen for a repeat MBS and presenting with moderate-to-severe oropharyngeal dysphagia. Pt exhibited overall improved oral function as evidenced by improved oral control and less significant oral delay. Pharyngeal deficits appeared mostly consistent as compared to previous MBS obtained on 02/13/2022. Oral deficits are characterized as anterior loss of bolus on right without awareness, decreased lingual manipulation, delayed oral transit, piecemeal swallow with thicker viscosities, prolonged mastication, and premature spillage to the pyriforms with all liquid consistencies. Premature spillage resulted in silent aspiration x1 before the swallow with nectar thick liquid by cup following dys 2 bolus; cued cough response appeared ineffective in clearing aspirates. Pharyngeal deficits are characterized by delayed swallow initiation at the pyriforms with all tested consistencies, reduced tongue base retraction, decreased epiglottic inversion, and reduced laryngeal closure resulting in sensed aspiration of trace amount with thin liquids during the swallow. There were min vallecular and pyriform sinus residuals with thin, NTL, HTL, and puree. Suspect there was a component of fatigue toward end of study. Pt had reduced ability to follow commands for compensation secondary to aphasia. Given these instrumental findings and pt's risk for silent aspiration as observed during today's study, recommend continuation of dysphagia 1 diet with honey thick liquids, meds crushed in puree, and full supervision/1:1 feeding assist. May advance solids at bedside as tolerated. SLP may provide nectar thick liquids by tsp  during continued SLP intervention. Given risk for dehydration with long-term use of thickened liquids and decreased quality of life, may consider water protocol of ice chips (crushed ice) and thin water via tsp sips only between meals and following extensive oral care.   Swallow Evaluation Recommendations       SLP Diet Recommendations: Dysphagia 1 (Puree) solids;Honey thick liquids   Liquid Administration via: Cup;Spoon   Medication Administration: Crushed with puree   Supervision: Patient able to self feed;Staff to assist with self feeding;Full supervision/cueing for compensatory strategies   Compensations: Minimize environmental distractions;Slow rate;Lingual sweep for clearance of pocketing;Monitor for anterior loss;Multiple dry swallows after each bite/sip;Clear throat intermittently;Small sips/bites   Postural Changes: Seated upright at 90 degrees   Oral Care Recommendations: Oral care before and after PO;Staff/trained caregiver to provide oral care   Other Recommendations: Prohibited food (jello, ice cream, thin soups);Order thickener from pharmacy;Remove water pitcher   Tamala Ser 03/05/2022,3:33 PM

## 2022-03-05 NOTE — Progress Notes (Signed)
Occupational Therapy Session Note  Patient Details  Name: Paul Bradshaw MRN: [id number] Date of Birth: 1926/05/04  Today's Date: 03/05/2022 OT Individual Time: 1115-1200 OT Individual Time Calculation (min): 45 min    Short Term Goals: Week 1:  OT Short Term Goal 1 (Week 1): Pt will be able to sit to stand to prep for LB dressing/ toileting with min A. OT Short Term Goal 1 - Progress (Week 1): Not progressing OT Short Term Goal 2 (Week 1): Pt will be able to stand pivot to [facility] with mod A. OT Short Term Goal 2 - Progress (Week 1): Not progressing OT Short Term Goal 3 (Week 1): Pt will be able to stand upright with min A to increase safety with standing during clothing management. OT Short Term Goal 3 - Progress (Week 1): Not progressing OT Short Term Goal 4 (Week 1): Pt will don shirt with mod A. OT Short Term Goal 4 - Progress (Week 1): Not progressing OT Short Term Goal 5 (Week 1): Pt will demonstrate awareness of RUE by moving R arm into position with LUE when cued. OT Short Term Goal 5 - Progress (Week 1): Not progressing Week 2:  OT Short Term Goal 1 (Week 2): Pt will be able to sit to EOB with mod A to prep for self care. OT Short Term Goal 2 (Week 2): Pt will be able to sit to stand from EOB with mod A to prep transfer. OT Short Term Goal 3 (Week 2): Pt will tolerate standing with mod A to enable caregives to A him with pericare post toileting. OT Short Term Goal 4 (Week 2): Pt will transfer to [facility] with mod A.      Skilled Therapeutic Interventions/Progress Updates:    Pt received in bed with son and 2 friends present. Discussed with son that we will continue working with him for 1 week to try to get him to a higher level of independence but that he will likely need physical A at home.  Son said he can walk at home without his power scooter, his SO is able to do quite a bit physically despite her PMH and she has a daughter that is a CNA that plans to help.  Discussed having a  hospital bed at home for pt. Pt's head of bed slightly elevated, pt able to scoot to EOB with only min A.  Pt the stood and transferred to wc with stand pivot with min A  and then to toilet min A,  he sat to void but only had gas. Pt does need full A to stabilize R arm as it tends to hang down when he stands.   Time did not allow for a shower, pt had brief and shorts donned with mod A.  Had pt return to room to demonstrate hemi dressing techniques to son. Pt was using a tighter tank top like he uses at home.  This is more difficult for him to don a regular tshirt due to tight fit.  Will try regular tshirt tomorrow.  Overall, good initiation and ability to follow one step directions.  Placed his hearing aids in and pt said "yes" when asked if he could hear better.   Pt's wife arrived with 2 family members. Pt got very excited, smiling, and crying.  Asked pt to stand up and give his wife a hug and kiss. He was able to do so with min A.  Pt resting in wc with belt alarm on  and pillow to support arm (could not fit arm rest on).  Visiting with family.    Therapy Documentation Precautions:  Precautions Precautions: Fall Precaution Comments: R hemipareisis, expressive>receptive aphasia Restrictions Weight Bearing Restrictions: No Pain: Pain Assessment Pain Scale: 0-10 Pain Score: 0-No pain    Therapy/Group: Individual Therapy  Maziah Keeling 03/05/2022, 9:49 AM

## 2022-03-05 NOTE — Progress Notes (Signed)
PROGRESS NOTE   Subjective/Complaints:  Pt looks brighter today , has not had breakfast   ROS: Limited due to language/communication   Objective:   No results found. Recent Labs    03/03/22 0800  WBC 7.3  HGB 11.6*  HCT 35.1*  PLT 395      Recent Labs    03/03/22 0800  NA 134*  K 3.8  CL 100  CO2 27  GLUCOSE 128*  BUN 25*  CREATININE 0.83  CALCIUM 9.0      Intake/Output Summary (Last 24 hours) at 03/05/2022 0827 Last data filed at 03/05/2022 0700 Gross per 24 hour  Intake 427 ml  Output --  Net 427 ml      Pressure Injury 02/07/22 Coccyx Medial Stage 1 -  Intact skin with non-blanchable redness of a localized area usually over a bony prominence. (Active)  02/07/22 1327  Location: Coccyx  Location Orientation: Medial  Staging: Stage 1 -  Intact skin with non-blanchable redness of a localized area usually over a bony prominence.  Wound Description (Comments):   Present on Admission: Yes    Physical Exam: Vital Signs Blood pressure (!) 101/56, pulse 69, temperature 97.7 F (36.5 C), resp. rate 16, height $RemoveBe'5\' 5"'lFKLCsJev$  (1.651 m), weight 64.5 kg, SpO2 97 %.   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 Does nod Y/N. Moving BL LE and LUE in bed. Flaccid RUE. Does not follow commands for strength testing consistently.   Antigravity RLE     Responsive and localizing  to pain except in RUE.      Assessment/Plan: 1. Functional deficits which require 3+ hours per day of interdisciplinary therapy in a comprehensive inpatient rehab setting. Physiatrist is providing close team supervision and 24 hour management of active medical problems listed below. Physiatrist and rehab team continue to assess barriers to discharge/monitor patient progress toward functional and medical goals  Care Tool:  Bathing    Body parts bathed by patient: Chest, Face, Abdomen, Right upper leg, Left upper leg   Body parts bathed by helper: Right arm, Left arm, Buttocks, Front perineal area, Right lower leg, Left lower leg     Bathing assist Assist Level: Moderate Assistance - Patient 50 - 74%     Upper Body Dressing/Undressing Upper body dressing   What is the patient wearing?: Pull over shirt    Upper body assist Assist Level: Moderate Assistance - Patient 50 - 74%    Lower Body Dressing/Undressing Lower body dressing      What is the patient wearing?: Incontinence brief, Pants     Lower body assist Assist for lower body dressing: Maximal Assistance - Patient 25 - 49%  Toileting Toileting    Toileting assist Assist for toileting: Total Assistance - Patient < 25%     Transfers Chair/bed transfer  Transfers assist  Chair/bed transfer activity did not occur: Safety/medical concerns  Chair/bed transfer assist level: Moderate Assistance - Patient 50 - 74%     Locomotion Ambulation   Ambulation assist   Ambulation activity did not occur: Safety/medical concerns          Walk 10 feet activity   Assist  Walk 10 feet activity did not occur: Safety/medical concerns        Walk 50 feet activity   Assist Walk 50 feet with 2 turns activity did not occur: Safety/medical concerns         Walk 150 feet activity   Assist Walk 150 feet activity did not occur: Safety/medical concerns         Walk 10 feet on uneven surface   activity   Assist Walk 10 feet on uneven surfaces activity did not occur: Safety/medical concerns         Wheelchair     Assist Is the patient using a wheelchair?: Yes (Did not use one PTA) Type of Wheelchair: Manual Wheelchair activity did not occur: Safety/medical concerns         Wheelchair 50 feet with 2 turns activity    Assist    Wheelchair 50 feet with 2 turns activity did not occur: Safety/medical concerns       Wheelchair 150 feet activity     Assist  Wheelchair 150 feet activity did not occur: Safety/medical concerns       Blood pressure (!) 101/56, pulse 69, temperature 97.7 F (36.5 C), resp. rate 16, height $RemoveBe'5\' 5"'pQhSuEGZE$  (1.651 m), weight 64.5 kg, SpO2 97 %.  Medical Problem List and Plan: 1. Functional deficits secondary to left MCA/ICA stroke with dense RUE HP and expressive aphasia-              -patient may shower             -ELOS/Goals: 9/15  -Continue CIR therapies including PT, OT, and SLP  Team conference today please see physician documentation under team conference tab, met with team  to discuss problems,progress, and goals. Formulized individual treatment plan based on medical history, underlying problem and comorbidities.              -WHO for RUE 2.  Antithrombotics: -DVT/anticoagulation:  Pharmaceutical: Lovenox             -antiplatelet therapy: DAPT X 3 months followed by ASA alone.  3. Pain: continue Tylenol prn.  CVA related pain developing tone in RIght finger an wrist flexors, cont ROM, premedicate with tylenol discussed with RN and OT- trial Voltaren gel   4. Mood/Behavior/Sleep: LCSW to follow for evaluation and support.              -antipsychotic agents: N/A 5. Neuropsych/cognition: This patient is not fully capable of making decisions on his own behalf due to aphasia. 6. Skin/Wound Care: Routine pressure relief measures.  7. Fluids/Electrolytes/Nutrition: Monitor I/O- oral intake ok          HypoK+ resolved on  supplements     Latest Ref Rng & Units 03/03/2022    8:00 AM 02/25/2022    6:38 AM 02/21/2022    7:54 AM  BMP  Glucose 70 - 99 mg/dL 128  117    BUN 8 - 23 mg/dL 25  24    Creatinine 0.61 - 1.24  mg/dL 0.83  1.01    Sodium 135 - 145 mmol/L 134  134    Potassium 3.5 - 5.1 mmol/L 3.8  4.2  4.2   Chloride 98 - 111 mmol/L 100  97    CO2 22 - 32 mmol/L 27  26    Calcium 8.9 - 10.3 mg/dL 9.0  8.5      HypoK+ will supplement , not on diuretics but had several stools on 8/27  9/2- last K+ 4.2- doing well 8. L-MCA infarct with hemorrhagic conversion: DAPT X 90 day followed by ASA alone 9. HTN: Monitor BP TID. BP remains labile. Continue Proscar.  --Continue lopressor to 12.$RemoveBeforeD'5mg'NgafHwXCzoWFfM$  BID given hypotension  Vitals:   03/04/22 1955 03/05/22 0543  BP: (!) 119/51 (!) 101/56  Pulse: 82 69  Resp: 18 16  Temp: 98.3 F (36.8 C) 97.7 F (36.5 C)  SpO2: 100% 97%   Controlled 9/13 10. Dysphagia: Continue D1, honey thick liquids. Needs assistance for feeding and supervision for safety.  --hypernatremia/AKI likely due to dysphagia diet 11. BPH: Monitor for any voiding difficulties. Will order PVR checks as off his meds.  --Proscar was not resumed. Flomax was d/c on 08/20.   13. Covid + asymptomatic,finished 10d in room quarantine on 9/8    Latest Ref Rng & Units 03/03/2022    8:00 AM 02/27/2022    5:35 AM 02/26/2022    6:42 AM  CBC  WBC 4.0 - 10.5 K/uL 7.3  9.4  11.1   Hemoglobin 13.0 - 17.0 g/dL 11.6  10.7  11.3   Hematocrit 39.0 - 52.0 % 35.1  32.4  34.4   Platelets 150 - 400 K/uL 395  345  340   Leukocytosis resolved  14. Apneic episodes on continuous pulse ox while sleeping, does recover on own, consulted RT - aphasic cannot ask about am HA or other symptoms    LOS: 16 days A FACE TO FACE EVALUATION WAS PERFORMED  Charlett Blake 03/05/2022, 8:27 AM

## 2022-03-05 NOTE — Progress Notes (Signed)
Patient ID: Paul Bradshaw, male   DOB: 1925-10-18, 88 y.o.   MRN: [id number]  Sw made attempt to contact patient son to provide team conference update, left detailed VM.

## 2022-03-05 NOTE — Patient Care Conference (Signed)
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 Claudette Laws Social Worker Present: Lavera Guise, BSW Nurse Present: Chana Bode, RN PT Present: Grier Rocher, PT OT Present: Primitivo Gauze, OT SLP Present: Eilene Ghazi, SLP PPS Coordinator present : Fae Pippin, SLP     Current Status/Progress Goal Weekly Team Focus  Bowel/Bladder   Pt is incontinent of bowel/bladder  Pt will become continent of bowel/bladder  Will assess qshift and PRN   Swallow/Nutrition/ Hydration   D1 diet and HTL - plans for MBSS; Max A  mod A  MBSS   ADL's   moderate assist for UB bathing & dressing, total assist for LB dressing/bathing/toileting, and fluctuating between minimal assist with ambulatory transfer - moderate assits for SPT's d/t varying fatigue levels  mod  A overall (goals downgraded)  RUE NMR, enurance, transfers, ADL training, visual awareness & attention, functional strengthening, pt/famility education   Mobility   Since coming off precautions and son with GF visiting daily, pt's mood and participation is improving. However aphasia fluctuates and continues to require very simple cueing with dificulty in educating on required techniques.Performance varies:  Bed mobility = CGA to Mod/  MaxA, Transfers = MinA to Mod/MaxA, ambulation = up to ~15-33ft using RW with R hand saddle splint, MinA for R lean, RW mgmt, intermittent initiation of RLE with weight shift facilitation required.  overall CGA/ MinA -not downgraded yet as pt demos intermittent improvements in mobility with need for extended time in rehab  will require continued family ed throughout to determine family's LOA during ambulation/ transfers, son and gf not able to provide heavy physical assist, continued NMR for R hemibody, motor planning, proprioception, balance, and understanding; improving LOA with transfers, gait training, stair training if possible, family ed not completed   Communication   Max to Total A multimodal communication, Mod-Max A for comprehension of basic, one-step directives  mod A basic auditory comprehension, max A expression through multimodal means  yes/no responses, gestural communication, following 1-step directives, vocalizations   Safety/Cognition/ Behavioral Observations  Difficult to assess  NA at this time  N/A   Pain   Pt shows no sign of pain  Pt will remain pain free  Will assess qshift and PRN   Skin   Pt has a stage 1 on sacrum  Pt's sacrum will heal  Will assess qshift and PRN     Discharge Planning:  Discharging home with his son, Casimiro Needle and friend. Family education completed with son. DME reccs?   Team Discussion: Patient with aphasia and right UE weakness. Family has not had physical assistance practice with limited visitation while on precautions.   Patient on target to meet rehab goals: no, patient currently needs mod assist for upper body care and mod - max assist for lower body care.  Function fluctuates due to fatigue but he has better participation since precautions lifted and he can come out of the room. Continue to note impaired swallowing and cognition issues. Limited vocalizations with non - intelligible responses. Needs max - total assist to express needs and mod  assist for comprehension.    *See Care Plan and progress notes for long and short-term goals.   Revisions to Treatment Plan:  MBS 03/05/22 = puree/honey diet Downgraded to min assist for PT goals and downgraded to mod assist for OT goals   Teaching Needs: Safety, medications, dietary modifications, transfers, toileting, etc.  Current Barriers to Discharge: Home enviroment access/layout and Lack of/limited family support  Possible Resolutions to Barriers: Family education HH follow up services DME: [facility]     Medical Summary Current Status: slow improvement with mobility, missed alot of therapy with Covid  Barriers to Discharge: Medical stability  Barriers to Discharge Comments: complicated family situation Possible Resolutions to [employer] Focus: may do family ed now that isolation has d/ced   Continued Need for Acute Rehabilitation Level of Care: The patient requires daily medical management by a physician with specialized training in physical medicine and rehabilitation for the following reasons: Direction of a multidisciplinary physical rehabilitation program to maximize functional independence : Yes Medical management of patient stability for increased activity during participation in an intensive rehabilitation regime.: Yes Analysis of laboratory values and/or radiology reports with any subsequent need for medication adjustment and/or medical intervention. : Yes   I attest that I was present, lead the team conference, and concur with the assessment and plan of the team.   Chana Bode B 03/05/2022, 2:22 PM

## 2022-03-05 NOTE — Plan of Care (Signed)
  Problem: RH Balance Goal: LTG Patient will maintain dynamic standing balance (PT) Description: LTG:  Patient will maintain dynamic standing balance with assistance during mobility activities (PT) Flowsheets (Taken 03/05/2022 0938) LTG: Pt will maintain dynamic standing balance during mobility activities with:: Minimal Assistance - Patient > 75% Note: Downgraded due to slow progress   Problem: Sit to Stand Goal: LTG:  Patient will perform sit to stand with assistance level (PT) Description: LTG:  Patient will perform sit to stand with assistance level (PT) Flowsheets (Taken 03/05/2022 0938) LTG: PT will perform sit to stand in preparation for functional mobility with assistance level: Contact Guard/Touching assist   Problem: RH Bed Mobility Goal: LTG Patient will perform bed mobility with assist (PT) Description: LTG: Patient will perform bed mobility with assistance, with/without cues (PT). Flowsheets (Taken 03/05/2022 [phone]) LTG: Pt will perform bed mobility with assistance level of: Minimal Assistance - Patient > 75% Note: Downgraded due to slow progress   Problem: RH Bed to Chair Transfers Goal: LTG Patient will perform bed/chair transfers w/assist (PT) Description: LTG: Patient will perform bed to chair transfers with assistance (PT). Flowsheets (Taken 03/05/2022 [phone]) LTG: Pt will perform Bed to Chair Transfers with assistance level: Minimal Assistance - Patient > 75%   Problem: RH Car Transfers Goal: LTG Patient will perform car transfers with assist (PT) Description: LTG: Patient will perform car transfers with assistance (PT). Flowsheets (Taken 03/05/2022 [phone]) LTG: Pt will perform car transfers with assist:: Minimal Assistance - Patient > 75% Note: Downgraded due to slow progress   Problem: RH Furniture Transfers Goal: LTG Patient will perform furniture transfers w/assist (OT/PT) Description: LTG: Patient will perform furniture transfers  with assistance (OT/PT). Flowsheets  (Taken 03/05/2022 [phone]) LTG: Pt will perform furniture transfers with assist:: Minimal Assistance - Patient > 75% Note: Downgraded due to slow progress   Problem: RH Ambulation Goal: LTG Patient will ambulate in controlled environment (PT) Description: LTG: Patient will ambulate in a controlled environment, # of feet with assistance (PT). Flowsheets (Taken 03/05/2022 [phone]) LTG: Pt will ambulate in controlled environ  assist needed:: Minimal Assistance - Patient > 75% LTG: Ambulation distance in controlled environment: 42ft with LRAD Note: Downgraded due to slow progress Goal: LTG Patient will ambulate in home environment (PT) Description: LTG: Patient will ambulate in home environment, # of feet with assistance (PT). Flowsheets (Taken 03/05/2022 0938) LTG: Pt will ambulate in home environ  assist needed:: Minimal Assistance - Patient > 75% LTG: Ambulation distance in home environment: 34ft with LRAD Note: Downgraded due to slow progress

## 2022-03-05 NOTE — Progress Notes (Signed)
Physical Therapy Session Note  Patient Details  Name: Paul Bradshaw MRN: [id number] Date of Birth: Mar 08, 1926  Today's Date: 03/05/2022 PT Individual Time: 1605-1700 PT Individual Time Calculation (min): 55 min   Short Term Goals:  Week 2:  PT Short Term Goal 1 (Week 2): Pt will perform bed mobility with overall CGA and using no bed features. PT Short Term Goal 2 (Week 2): Pt will perform all functional transfers with light MinA/ CGA and LRAD. PT Short Term Goal 3 (Week 2): Pt will ambulate at least 40 ft using LRAD with CGA/ MinA. PT Short Term Goal 4 (Week 2): Pt will initiate stair training.  Skilled Therapeutic Interventions/Progress Updates:   Pt received supine in bed and agreeable to PT. Bed mobility with min-mod assist and increased time. Sitting balance EOB with supervision assist from PT for safety while obtaining clothes from drawer.   Sit<>stand with min assist from PT to assist donning pants in standing. Additional sit<>stand with min assist throughout session with RUE supported on RW in hand splint.   Gait training with RW x 43f with min assist and then mod assist for AD management with 180deg turn to sit on mat table.   Standing NMR to preform reciprocal stepping R and L x 10 with mutimodal visual and tactile instruction. .   Stair management training x 4 steps with min assist ascend and mod assist desent pt performed step over step ascent and step to descent with LLE, despite cues for step to gait pattern. .Venida Jarvisreciprocal movement training x 346m +3 min with max assist from PT on the RUE to stabilize on hand grip and maintain [facility] to [facility]. .   Pt returned to room and performed stand pivot transfer to bed with min assist ant LUE on bed rial. Sit>supine completed with min assist on the RUE/RLE, and left supine in bed with call bell in reach and all needs met.        Therapy Documentation Precautions:  Precautions Precautions: Fall Precaution  Comments: R hemipareisis, expressive>receptive aphasia Restrictions Weight Bearing Restrictions: No General:   Vital Signs: Therapy Vitals Temp: 97.8 F (36.6 C) Temp Source: Oral Pulse Rate: 70 Resp: 17 BP: (!) 104/54 Patient Position (if appropriate): Lying Oxygen Therapy SpO2: 98 % O2 Device: Room Air Pain: FACES: NO PAIN     Therapy/Group: Individual Therapy  AuLorie Phenix/13/2023, 5:03 PM

## 2022-03-06 NOTE — Plan of Care (Signed)
  Problem: RH Balance Goal: LTG: Patient will maintain dynamic sitting balance (OT) Description: LTG:  Patient will maintain dynamic sitting balance with assistance during activities of daily living (OT) Flowsheets (Taken 03/06/2022 1245) LTG: Pt will maintain dynamic sitting balance during ADLs with: (upgraded due to progress) Contact Guard/Touching assist Note: upgraded due to progress   Problem: Sit to Stand Goal: LTG:  Patient will perform sit to stand in prep for activites of daily living with assistance level (OT) Description: LTG:  Patient will perform sit to stand in prep for activites of daily living with assistance level (OT) Flowsheets (Taken 03/06/2022 1245) LTG: PT will perform sit to stand in prep for activites of daily living with assistance level: (upgraded due to progress) Contact Guard/Touching assist Note: upgraded due to progress   Problem: RH Toilet Transfers Goal: LTG Patient will perform toilet transfers w/assist (OT) Description: LTG: Patient will perform toilet transfers with assist, with/without cues using equipment (OT) Flowsheets (Taken 03/06/2022 1245) LTG: Pt will perform toilet transfers with assistance level of: (upgraded due to progress) Contact Guard/Touching assist Note: upgraded due to progress   Problem: RH Tub/Shower Transfers Goal: LTG Patient will perform tub/shower transfers w/assist (OT) Description: LTG: Patient will perform tub/shower transfers with assist, with/without cues using equipment (OT) Flowsheets (Taken 03/06/2022 1245) LTG: Pt will perform tub/shower stall transfers with assistance level of: (upgraded due to progress) Minimal Assistance - Patient > 75% Note: upgraded due to progress

## 2022-03-06 NOTE — Progress Notes (Addendum)
Patient ID: Paul Bradshaw, male   DOB: 1926/02/22, 88 y.o.   MRN: [id number]  [facility] referral sent to [facility]. Patient approved for PT/OT/SLP

## 2022-03-06 NOTE — Progress Notes (Signed)
Patient ID: Paul Bradshaw, male   DOB: 1925-11-16, 88 y.o.   MRN: [id number]  Bedside Commode, Transfer Bench, Rolling Walker and Suction Kit ordered through [employer].

## 2022-03-06 NOTE — Progress Notes (Signed)
PROGRESS NOTE   Subjective/Complaints:  Remains severely aphasic   ROS: Limited due to language/communication   Objective:   DG Swallowing Func-Speech Pathology  Result Date: 03/05/2022 Table formatting from the original result was not included. Objective Swallowing Evaluation: Type of Study: MBS-Modified Barium Swallow Study  Patient Details Name: Paul Bradshaw MRN: [id number] Date of Birth: 09/09/1925 Today's Date: 03/05/2022 Past Medical History: Past Medical History: Diagnosis Date  Aortic regurgitation   Moderate  Arthritis   BPH (benign prostatic hyperplasia)   Cervical disc disease   Cervical radiculopathy   Hyperlipidemia   Hypertension   Prediabetes 02/16/2021  Staphylococcus aureus bacteremia 08/09/2012  TEE negative for vegetation or thrombus February 2014  Stroke [facility]) 2005 or 2006  3  Symptomatic carotid artery stenosis with infarction [facility]) 2005 or 2006  Status post right carotid endarterectomy Past Surgical History: Past Surgical History: Procedure Laterality Date  BACK SURGERY    CAROTID ENDARTERECTOMY    CATARACT EXTRACTION W/PHACO Left 02/15/2015  Procedure: CATARACT EXTRACTION PHACO AND INTRAOCULAR LENS PLACEMENT LEFT EYE CDE=30.93;  Surgeon: Gemma Payor, MD;  Location: [facility] ORS;  Service: Ophthalmology;  Laterality: Left;  CHOLECYSTECTOMY    EYE SURGERY    KIDNEY STONE SURGERY    LUMBAR LAMINECTOMY/DECOMPRESSION MICRODISCECTOMY Bilateral 01/23/2014  Procedure: LUMBAR LAMINECTOMY/DECOMPRESSION MICRODISCECTOMY 1 LEVEL L5-S1;  Surgeon: Temple Pacini, MD;  Location: [facility] NEURO OR[facility];  Service: Neurosurgery;  Laterality: Bilateral;  LUMBAR LAMINECTOMY/DECOMPRESSION MICRODISCECTOMY 1 LEVEL L5-S1  TEE WITHOUT CARDIOVERSION N/A 08/12/2012  Procedure: TRANSESOPHAGEAL ECHOCARDIOGRAM (TEE);  Surgeon: Wendall Stade, MD;  Location: [facility] ENDO SUITE;  Service: Cardiovascular;  Laterality: N/A;  TEE WITHOUT CARDIOVERSION N/A 06/24/2013  Procedure:  TRANSESOPHAGEAL ECHOCARDIOGRAM (TEE);  Surgeon: Jonelle Sidle, MD;  Location: [facility] END[facility] SU[facility];  Service: Endoscopy;  Laterality: N/A; HPI: CHISTOPHER Bradshaw is a 88 y.o. male with medical history significant of hypertension, BPH, hyperlipidemia, history of carotid artery disease and chronic back pain with radiculopathy; who presented to the emergency department secondary to aphasia, inability to walk and right-sided weakness.  Patient was last seen normal prior to bed on 02/06/2022 (around 9:10 PM). noted to have MCA infarct and several occlusions on LVO. Pt has been participating in skilled ST intervention in CIR since 08/29.  Subjective: Nonverbal  Recommendations for follow up therapy are one component of a multi-disciplinary discharge planning process, led by the attending physician.  Recommendations may be updated based on patient status, additional functional criteria and insurance authorization. Assessment / Plan / Recommendation   03/05/2022  12:39 PM Clinical Impressions Clinical Impression Pt was seen for a repeat MBS with overall improved oral function and mostly consistent pharyngeal deficits as compared to previous MBS obtained on 02/13/2022. Oral deficits are characterized as anterior loss of bolus on right without awareness, decreased lingual manipulation, delayed oral transit, piecemeal swallow with thicker viscosities, prolonged mastication, and premature spillage to the pyriforms with all liquid consistencies. Premature spillage resulted in silent aspiration x1 before the swallow with nectar thick liquid by cup following dys 2 bolus; cued cough response appeared ineffective in clearing aspirates. Pharyngeal deficits are characterized by delayed swallow initiation at the pyriforms with all tested consistencies, reduced tongue base  retraction, decreased epiglottic inversion, and reduced laryngeal closure resulting in sensed aspiration of trace amount with thin liquids during the swallow. There were min  vallecular and pyriform sinus residuals with thin, NTL, HTL, and puree. Suspect there was a component of fatigue toward end of study. Pt had reduced ability to follow commands for compensation secondary to aphasia. Given these instrumental findings and pt's risk for silent aspiration as observed during today's study, recommend continuation of dysphagia 1 diet with honey thick liquids, meds crushed in puree, and full supervision/1:1 feeding assist. May advance solids at bedside as tolerated. SLP may provide nectar thick liquids by tsp during continued SLP intervention. Given risk for dehydration with long-term use of thickened liquids and decreased quality of life, may consider water protocol of ice chips (crushed ice) and thin water via tsp sips only between meals and following extensive oral care. SLP Visit Diagnosis Dysphagia, oropharyngeal phase (R13.12) Impact on safety and function Moderate aspiration risk;Risk for inadequate nutrition/hydration     03/05/2022  12:39 PM Treatment Recommendations Treatment Recommendations Therapy as outlined in treatment plan below     03/05/2022   1:06 PM Prognosis Prognosis for Safe Diet Advancement Fair Barriers to Reach Goals Language deficits;Severity of deficits   03/05/2022  12:39 PM Diet Recommendations SLP Diet Recommendations Dysphagia 1 (Puree) solids;Honey thick liquids Liquid Administration via Cup;Spoon Medication Administration Crushed with puree Compensations Minimize environmental distractions;Slow rate;Lingual sweep for clearance of pocketing;Monitor for anterior loss;Multiple dry swallows after each bite/sip;Clear throat intermittently;Small sips/bites Postural Changes Seated upright at 90 degrees     03/05/2022  12:39 PM Other Recommendations Oral Care Recommendations Oral care before and after PO;Staff/trained caregiver to provide oral care Other Recommendations Prohibited food (jello, ice cream, thin soups);Order thickener from pharmacy;Remove water pitcher  Follow Up Recommendations Acute inpatient rehab (3hours/day) Assistance recommended at discharge Frequent or constant Supervision/Assistance Functional Status Assessment Patient has had a recent decline in their functional status and/or demonstrates limited ability to make significant improvements in function in a reasonable and predictable amount of time   02/13/2022  12:00 PM Frequency and Duration  Speech Therapy Frequency (ACUTE ONLY) min 2x/week Treatment Duration 1 week     03/05/2022  10:04 AM Oral Phase Oral Phase Impaired Oral - Pudding Teaspoon NT Oral - Pudding Cup NT Oral - Honey Teaspoon NT Oral - Honey Cup Delayed oral transit;Weak lingual manipulation;Premature spillage Oral - Nectar Teaspoon Weak lingual manipulation;Delayed oral transit;Premature spillage;Decreased bolus cohesion Oral - Nectar Cup Weak lingual manipulation;Incomplete tongue to palate contact;Delayed oral transit;Decreased bolus cohesion;Premature spillage Oral - Nectar Straw NT Oral - Thin Teaspoon Weak lingual manipulation;Incomplete tongue to palate contact;Reduced posterior propulsion;Lingual/palatal residue;Delayed oral transit;Decreased bolus cohesion;Premature spillage Oral - Thin Cup NT Oral - Thin Straw NT Oral - Puree Weak lingual manipulation;Delayed oral transit;Decreased bolus cohesion Oral - Mech Soft Impaired mastication;Weak lingual manipulation;Delayed oral transit;Decreased bolus cohesion Oral - Regular NT Oral - Multi-Consistency NT Oral - Pill NT    03/05/2022  12:24 PM Pharyngeal Phase Pharyngeal- Nectar Teaspoon Delayed swallow initiation-pyriform sinuses;Reduced epiglottic inversion;Reduced airway/laryngeal closure;Pharyngeal residue - pyriform;Pharyngeal residue - valleculae;Pharyngeal residue - posterior pharnyx Pharyngeal Material does not enter airway Pharyngeal- Nectar Cup Delayed swallow initiation-pyriform sinuses;Reduced epiglottic inversion;Reduced airway/laryngeal closure;Penetration/Aspiration before  swallow;Pharyngeal residue - valleculae;Pharyngeal residue - pyriform Pharyngeal Material enters airway, passes BELOW cords without attempt by patient to eject out (silent aspiration);Material enters airway, passes BELOW cords and not ejected out despite cough attempt by patient Pharyngeal- Thin Teaspoon Delayed swallow initiation-pyriform sinuses;Penetration/Aspiration during swallow;Trace  aspiration;Reduced airway/laryngeal closure;Reduced epiglottic inversion;Pharyngeal residue - valleculae Pharyngeal Material enters airway, passes BELOW cords without attempt by patient to eject out (silent aspiration) Pharyngeal- Puree Reduced epiglottic inversion;Reduced tongue base retraction;Pharyngeal residue - valleculae;Pharyngeal residue - pyriform;Pharyngeal residue - cp segment;Delayed swallow initiation-pyriform sinuses Pharyngeal Material does not enter airway    03/05/2022  12:39 PM Cervical Esophageal Phase  Cervical Esophageal Phase [facility] Sonny Masters, M.S., CCC-SLP Brianne T Garretson 03/05/2022, 3:40 PM                     Recent Labs    03/03/22 0800  WBC 7.3  HGB 11.6*  HCT 35.1*  PLT 395      Recent Labs    03/03/22 0800  NA 134*  K 3.8  CL 100  CO2 27  GLUCOSE 128*  BUN 25*  CREATININE 0.83  CALCIUM 9.0      Intake/Output Summary (Last 24 hours) at 03/06/2022 5621 Last data filed at 03/05/2022 1700 Gross per 24 hour  Intake 491 ml  Output --  Net 491 ml      Pressure Injury 02/07/22 Coccyx Medial Stage 1 -  Intact skin with non-blanchable redness of a localized area usually over a bony prominence. (Active)  02/07/22 1327  Location: Coccyx  Location Orientation: Medial  Staging: Stage 1 -  Intact skin with non-blanchable redness of a localized area usually over a bony prominence.  Wound Description (Comments):   Present on Admission: Yes    Physical Exam: Vital Signs Blood pressure 111/67, pulse 65, temperature 98.1 F (36.7 C), temperature source Oral, resp. rate  18, height 5\' 5"  (1.651 m), weight 64.5 kg, SpO2 100 %.   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 Does nod Y/N. Moving BL LE and LUE in bed. Flaccid RUE. Does not follow commands for strength testing consistently.   Antigravity RLE     Responsive and localizing to pain except in RUE.      Assessment/Plan: 1. Functional deficits which require 3+ hours per day of interdisciplinary therapy in a comprehensive inpatient rehab setting. Physiatrist is providing close team supervision and 24 hour management of active medical problems listed below. Physiatrist and rehab team continue to assess barriers to discharge/monitor patient progress toward functional and medical goals  Care Tool:  Bathing    Body parts bathed by patient: Chest, Face, Abdomen, Right upper leg, Left upper leg   Body parts bathed by helper: Right arm, Left arm, Buttocks, Front perineal area, Right lower leg, Left lower leg     Bathing assist Assist Level: Moderate Assistance - Patient 50 - 74%     Upper Body Dressing/Undressing Upper body dressing   What is the patient wearing?: Pull over shirt    Upper body assist Assist Level: Moderate Assistance - Patient 50 - 74%    Lower Body Dressing/Undressing Lower body dressing      What is the patient wearing?: Incontinence brief, Pants     Lower body assist Assist for  lower body dressing: Maximal Assistance - Patient  25 - 49%     Toileting Toileting    Toileting assist Assist for toileting: Total Assistance - Patient < 25%     Transfers Chair/bed transfer  Transfers assist  Chair/bed transfer activity did not occur: Safety/medical concerns  Chair/bed transfer assist level: Moderate Assistance - Patient 50 - 74%     Locomotion Ambulation   Ambulation assist   Ambulation activity did not occur: Safety/medical concerns          Walk 10 feet activity   Assist  Walk 10 feet activity did not occur: Safety/medical concerns        Walk 50 feet activity   Assist Walk 50 feet with 2 turns activity did not occur: Safety/medical concerns         Walk 150 feet activity   Assist Walk 150 feet activity did not occur: Safety/medical concerns         Walk 10 feet on uneven surface  activity   Assist Walk 10 feet on uneven surfaces activity did not occur: Safety/medical concerns         Wheelchair     Assist Is the patient using a wheelchair?: Yes (Did not use one PTA) Type of Wheelchair: Manual Wheelchair activity did not occur: Safety/medical concerns         Wheelchair 50 feet with 2 turns activity    Assist    Wheelchair 50 feet with 2 turns activity did not occur: Safety/medical concerns       Wheelchair 150 feet activity     Assist  Wheelchair 150 feet activity did not occur: Safety/medical concerns       Blood pressure 111/67, pulse 65, temperature 98.1 F (36.7 C), temperature source Oral, resp. rate 18, height 5\' 5"  (1.651 m), weight 64.5 kg, SpO2 100 %.  Medical Problem List and Plan: 1. Functional deficits secondary to left MCA/ICA stroke with dense RUE HP and expressive aphasia-              -patient may shower             -ELOS/Goals: 9/15 changed D/C to next week to make up for time in quarantine and to allow family training   -Continue CIR therapies including PT, OT, and SLP                -WHO for RUE 2.   Antithrombotics: -DVT/anticoagulation:  Pharmaceutical: Lovenox             -antiplatelet therapy: DAPT X 3 months followed by ASA alone.  3. Pain: continue Tylenol prn.  CVA related pain developing tone in RIght finger an wrist flexors, cont ROM, premedicate with tylenol discussed with RN and OT- trial Voltaren gel   4. Mood/Behavior/Sleep: LCSW to follow for evaluation and support.              -antipsychotic agents: N/A 5. Neuropsych/cognition: This patient is not fully capable of making decisions on his own behalf due to aphasia. 6. Skin/Wound Care: Routine pressure relief measures.  7. Fluids/Electrolytes/Nutrition: Monitor I/O- oral intake ok          HypoK+ resolved on supplements     Latest Ref Rng & Units 03/03/2022    8:00 AM 02/25/2022    6:38 AM 02/21/2022    7:54 AM  BMP  Glucose 70 - 99 mg/dL 161128  096117    BUN 8 - 23 mg/dL 25  24    Creatinine  0.61 - 1.24 mg/dL 5.64  3.32    Sodium 951 - 145 mmol/L 134  134    Potassium 3.5 - 5.1 mmol/L 3.8  4.2  4.2   Chloride 98 - 111 mmol/L 100  97    CO2 22 - 32 mmol/L 27  26    Calcium 8.9 - 10.3 mg/dL 9.0  8.5      HypoK+ will supplement , not on diuretics but had several stools on 8/27  9/2- last K+ 4.2- doing well 8. L-MCA infarct with hemorrhagic conversion: DAPT X 90 day followed by ASA alone 9. HTN: Monitor BP TID. BP remains labile. Continue Proscar.  --Continue lopressor to 12.5mg  BID given hypotension  Vitals:   03/05/22 2015 03/06/22 0408  BP: (!) 109/46 111/67  Pulse: 93 65  Resp: 16 18  Temp: 98.6 F (37 C) 98.1 F (36.7 C)  SpO2: 96% 100%   Controlled 9/14 10. Dysphagia: Continue D1, honey thick liquids. Needs assistance for feeding and supervision for safety.  --hypernatremia/AKI likely due to dysphagia diet 11. BPH: Monitor for any voiding difficulties. Will order PVR checks as off his meds.  --Proscar was not resumed. Flomax was d/c on 08/20.   13. Covid + asymptomatic,finished 10d in room quarantine on  9/8    Latest Ref Rng & Units 03/03/2022    8:00 AM 02/27/2022    5:35 AM 02/26/2022    6:42 AM  CBC  WBC 4.0 - 10.5 K/uL 7.3  9.4  11.1   Hemoglobin 13.0 - 17.0 g/dL 88.4  16.6  06.3   Hematocrit 39.0 - 52.0 % 35.1  32.4  34.4   Platelets 150 - 400 K/uL 395  345  340   Leukocytosis resolved  14. Apneic episodes on continuous pulse ox while sleeping, does recover on own, consulted RT - aphasic cannot ask about am HA or other symptoms    LOS: 17 days A FACE TO FACE EVALUATION WAS PERFORMED  Erick Colace 03/06/2022, 6:52 AM

## 2022-03-06 NOTE — Progress Notes (Addendum)
Physical Therapy Session Note  Patient Details  Name: Paul Bradshaw MRN: [id number] Date of Birth: 29-Aug-1925  Today's Date: 03/06/2022 PT Individual Time: 1500-1539 PT Individual Time Calculation (min): 39 min   Short Term Goals: Week 2:  PT Short Term Goal 1 (Week 2): Pt will perform bed mobility with overall CGA and using no bed features. PT Short Term Goal 2 (Week 2): Pt will perform all functional transfers with light MinA/ CGA and LRAD. PT Short Term Goal 3 (Week 2): Pt will ambulate at least 40 ft using LRAD with CGA/ MinA. PT Short Term Goal 4 (Week 2): Pt will initiate stair training.  Skilled Therapeutic Interventions/Progress Updates: Pt presented in bed sleeping but easily aroused. Pt noted to be in NAD and nodded head in agreement to therapy. Pt noted to not have any pants on. Performed supine to sit with modA for roll to L, BLE management, and truncal support. PTA threaded pants and donned shoes total A for time management. Performed Sit to stand with modA and max cues for L hand placement. PTA pulled pants over hips and secured button. Performed ambulatory transfer to R into w/c with modA for RW management and safety. Pt transported to rehab gym and ambulated 23f with RW and minA to high/low mat. Pt noted to ambulate with forward flexed posture, shortened uneven step length, and poor foot clearance. Participated in sitting balance activity of placing clothes pin on basketball net to L for R oblique stretch and trunk extension. Pt then performed same activity in standing but reaching to R with RUE for weight shifting to R. Pt was minA for Sit to stand but required max cues for hand placement. Pt then ambulated ~215fback to w/c with minA and improved R step length and cadence. Pt transported back to room and performed ambulatory transfer to bed. Pt performed Sit to standr from EOB with minA to allow PTA to doff pants. Performed sit to supine via sidelying with modA but pt was able to  cent self in bed via lateral bridging with supervision. Pt left in bed at end of session with bed alarm on, call bell within reach and needs met.      Therapy Documentation Precautions:  Precautions Precautions: Fall Precaution Comments: R hemipareisis, expressive>receptive aphasia Restrictions Weight Bearing Restrictions: No General: PT Missed Treatment Reason:  (scheduling conflict) Vital Signs: Therapy Vitals Temp: (!) 97.5 F (36.4 C) Temp Source: Oral Pulse Rate: 88 Resp: 17 BP: 113/64 Patient Position (if appropriate): Sitting Oxygen Therapy SpO2: 97 % O2 Device: Room Air Pain: Pain Assessment Pain Score: 0-No pain (grimacing to touch at R wrist only) Mobility:   Locomotion :    Trunk/Postural Assessment :    Balance:   Exercises:   Other Treatments:      Therapy/Group: Individual Therapy  Delphia Kaylor 03/06/2022, 3:48 PM

## 2022-03-06 NOTE — Progress Notes (Addendum)
Occupational Therapy Weekly Progress Note  Patient Details  Name: Paul Bradshaw MRN: [id number] Date of Birth: [birth year]  Beginning of progress report period: February 26, 2022 End of progress report period: March 06, 2022  Today's Date: 03/06/2022 OT Individual Time: 1045-1200 OT Individual Time Calculation (min): 75 min    Patient has met 4 of 4 short term goals.  Last week, when pt had covid and was on contact restrictions he was not participating as well. Some of his LTGs were downgraded. This week he is demonstrating more initiation, effort, and overall progress with his mobility so some of his goals have been upgraded. He continues to need a significant amount of assist with bathing, dressing, toileting but his functional mobility is progressing well.   Patient continues to demonstrate the following deficits: muscle weakness and muscle joint tightness, abnormal tone and unbalanced muscle activation, decreased attention to right, delayed processing, and decreased sitting balance, decreased standing balance, decreased postural control, hemiplegia, and decreased balance strategies and therefore will continue to benefit from skilled OT intervention to enhance overall performance with BADL.  Patient progressing toward long term goals..  Plan of care revisions: ,. Problem: RH Balance Goal: LTG: Patient will maintain dynamic sitting balance (OT) Description: LTG:  Patient will maintain dynamic sitting balance with assistance during activities of daily living (OT) Flowsheets (Taken 03/06/2022 1245) LTG: Pt will maintain dynamic sitting balance during ADLs with: (upgraded due to progress) Contact Guard/Touching assist Note: upgraded due to progress   Problem: Sit to Stand Goal: LTG:  Patient will perform sit to stand in prep for activites of daily living with assistance level (OT) Description: LTG:  Patient will perform sit to stand in prep for activites of daily living with assistance  level (OT) Flowsheets (Taken 03/06/2022 1245) LTG: PT will perform sit to stand in prep for activites of daily living with assistance level: (upgraded due to progress) Contact Guard/Touching assist Note: upgraded due to progress   Problem: RH Toilet Transfers Goal: LTG Patient will perform toilet transfers w/assist (OT) Description: LTG: Patient will perform toilet transfers with assist, with/without cues using equipment (OT) Flowsheets (Taken 03/06/2022 1245) LTG: Pt will perform toilet transfers with assistance level of: (upgraded due to progress) Contact Guard/Touching assist Note: upgraded due to progress   Problem: RH Tub/Shower Transfers Goal: LTG Patient will perform tub/shower transfers w/assist (OT) Description: LTG: Patient will perform tub/shower transfers with assist, with/without cues using equipment (OT) Flowsheets (Taken 03/06/2022 1245) LTG: Pt will perform tub/shower stall transfers with assistance level of: (upgraded due to progress) Minimal Assistance - Patient > 75% Note: upgraded due to progress    OT Short Term Goals Week 1:  OT Short Term Goal 1 (Week 1): Pt will be able to sit to stand to prep for LB dressing/ toileting with min A. OT Short Term Goal 1 - Progress (Week 1): Not progressing OT Short Term Goal 2 (Week 1): Pt will be able to stand pivot to [facility] with mod A. OT Short Term Goal 2 - Progress (Week 1): Not progressing OT Short Term Goal 3 (Week 1): Pt will be able to stand upright with min A to increase safety with standing during clothing management. OT Short Term Goal 3 - Progress (Week 1): Not progressing OT Short Term Goal 4 (Week 1): Pt will don shirt with mod A. OT Short Term Goal 4 - Progress (Week 1): Not progressing OT Short Term Goal 5 (Week 1): Pt will demonstrate awareness of RUE by moving  R arm into position with LUE when cued. OT Short Term Goal 5 - Progress (Week 1): Not progressing Week 2:  OT Short Term Goal 1 (Week 2): Pt will be able to  sit to EOB with mod A to prep for self care. OT Short Term Goal 1 - Progress (Week 2): Met OT Short Term Goal 2 (Week 2): Pt will be able to sit to stand from EOB with mod A to prep transfer. OT Short Term Goal 2 - Progress (Week 2): Met OT Short Term Goal 3 (Week 2): Pt will tolerate standing with mod A to enable caregives to A him with pericare post toileting. OT Short Term Goal 3 - Progress (Week 2): Met OT Short Term Goal 4 (Week 2): Pt will transfer to [facility] with mod A. OT Short Term Goal 4 - Progress (Week 2): Met Week 3:  OT Short Term Goal 1 (Week 3): STGs = LTGs  Skilled Therapeutic Interventions/Progress Updates:    Pt received in wc and agreeable to a shower.  Doffed his clothing and pt positioned on toilet (min with stand pivots with mod -max cues).  He then transferred to shower.  See ADL documentation below.  He is continuing to need significant A with dressing and toileting due to RUE hemiplegia, limited balance.  Pt very fatigued after standing multiple times during the session. Pt pointing to the bed.  Pt transferred to bed min and moved to supine min A.   From supine, worked on trunk lengthening with head resting on 1 pillow. Gentle ROM for RUE. He continues to have pain at wrist.  Tried to facilitate movement but unable to elicit movement on command. In the shower, he did actively lift arm to side about 30 degrees to wash under arm.    Pt resting in bed with all needs met and arm supported by pillows.   Therapy Documentation Precautions:  Precautions Precautions: Fall Precaution Comments: R hemipareisis, expressive>receptive aphasia Restrictions Weight Bearing Restrictions: No   Pain: Pain Assessment Pain Scale: 0-10 Pain Score: 0-No pain (grimacing to touch at R wrist only) ADL: ADL Eating: Moderate assistance Grooming: Moderate cueing, Supervision/safety Where Assessed-Grooming: Edge of bed Upper Body Bathing: Minimal assistance Where Assessed-Upper Body Bathing:  Shower Lower Body Bathing: Moderate assistance, Moderate cueing (Pt able to bathe 2.5/5 body parts while seated on tub-bench in the shower and standing with minimal assist with use of grab bars while therapist provides assist to bathe buttocks. Pt able to bathe B thighs & front peri-area with mod VCs for sequencing) Where Assessed-Lower Body Bathing: Shower Upper Body Dressing: Maximal assistance (Pt requires maximal assistance to doff a pull-over and button up shirt while seated on tub-bench. OT provides hand-over-hand and VCs for education on hemi-dressing techniques. Pt dons a gown after shower with maximal assist due to fatigue.) Where Assessed-Upper Body Dressing:  (tub-bench) Lower Body Dressing: Maximal assistance (Pt requires maximal assistance to doff pants and brief while standing with minimal assist and use of grab bar in the shower while pulling clothing down.) Where Assessed-Lower Body Dressing:  (tub-bench) Toileting: Dependent Where Assessed-Toileting:  (shower) Toilet Transfer: Minimal assistance Toilet Transfer Method: Stand pivot Science writer: Raised toilet seat, Grab bars Social research officer, government: Minimal assistance Social research officer, government Method: Radiographer, therapeutic: Radio broadcast assistant, Grab bars ADL Comments: Pt has been improving with ADL mobility   Therapy/Group: Individual Therapy  Humphrey Guerreiro 03/06/2022, 12:57 PM

## 2022-03-06 NOTE — Progress Notes (Signed)
Speech Language Pathology Weekly Progress and Session Note  Patient Details  Name: Paul Bradshaw MRN: [id number] Date of Birth: 1925-07-16  Beginning of progress report period: February 25, 2022 End of progress report period: March 06, 2022  Today's Date: 03/06/2022 SLP Individual Time: 0905-1000 SLP Individual Time Calculation (min): 55 min  Short Term Goals: Week 2: SLP Short Term Goal 1 (Week 2): STG=LTG due to ELOS SLP Short Term Goal 1 - Progress (Week 2): Progressing toward goal  New Short Term Goals: Week 3: SLP Short Term Goal 1 (Week 3): STG=LTG due to ELOS  Weekly Progress Updates: Pt has demonstrated functional gains and is slowly progressing toward long-term goals in regards to expressive/receptive language and swallowing goals. Pt continues to be limited by severity of deficits impacting functional communication, however, pt demonstrates improved intentional vocalizations, expressiveness with face, and has begun initiating use of gestures. Pt's receptive language appears to be improving as evidenced by responding to yes/no questions and following basic commands. Pt continues to communicate needs through non-verbal means. Pt demonstrates slow improvements with oropharyngeal swallow function as evidenced by recent modified barium swallow study (MBS) obtained on 03/05/2022 revealing a moderate-to-severe oropharyngeal dysphagia. SLP recommends continuation of dysphagia 1 textures with honey thick liquids with continued PO trials with SLP. Pt and family education ongoing re: diet recommendations, supportive communication interventions, and ST POC. Continue to recommend ST intervention during CIR admission, as well as ST f/u upon d/c. Recommend 24/7 supervision and assistance at time of discharge.     Intensity: Minumum of 1-2 x/day, 30 to 90 minutes Frequency: 3 to 5 out of 7 days Duration/Length of Stay: 9/21 Treatment/Interventions: Cognitive remediation/compensation;Cueing  hierarchy;Dysphagia/aspiration precaution training;Functional tasks;Internal/external aids;Multimodal communication approach;Patient/family education;Speech/Language facilitation;Therapeutic Activities  Daily Session Skilled Therapeutic Interventions: Skilled ST treatment focused on language goals. Pt greeted semi reclined in bed on arrival. Pt appeared in good spirits today with improved eye contact and expressiveness with face. Pt also tearful throughout session. Pt transferred from bed to wheelchair with min A to transfer, and min-to-mod A for following basic 1-step instructions to execute transfer. SLP navigated pt through hallway and pt was observed waving and smiling to familiar faces with mumbled vocalizations. SLP facilitated session outside and pt appeared to exhibit enjoyment by smiling and placing his hand over his heart. Pt made many vocalization attempts, however most were mumbled and unintelligible. Pt remained in good spirits and occasionally giggled at unsuccessful attempts. SLP facilitated singing tasks including "happy birthday", "amazing grace", "jingle bells", and "we wish you a merry christmas." Pt verbalized 25-50% of happy birthday with accurate intoning, and ~10% of words with the other songs. Pt benefited from cloze phrase and expectant look to fill in the blank at the end of song lines. SLP facilitated counting 1-10 in which pt verbally approximated up to 30%. SLP facilitated written expression with pen + paper. Pt initiated a capital "B" for his name followed by "b b b b." Pt was able to trace his first name with max fading to mod A to initiate each letter. Subjectively, pt appeared to understand comments from SLP throughout session as evidenced by nodding his head and exuding appropriate facial expressions and reactions. Pt appeared highly motivated to participate throughout session. Upon returning to room, pt initiated gesture by pointing to bathroom and nodded head "yes" when asked  if he needed to go to the bathroom. SLP notified NT. Patient was left in wheelchair with alarm activated and immediate needs within reach at end  of session. Continue per current plan of care.       General    Pain Pain Assessment Pain Scale: 0-10 Pain Score: 0-No pain (grimacing to touch at R wrist only)  Therapy/Group: Individual Therapy  Tamala Ser 03/06/2022, 1:03 PM

## 2022-03-06 NOTE — Discharge Instructions (Signed)
Inpatient Rehab Discharge Instructions  Paul Bradshaw Discharge date and time:  03/13/22  Activities/Precautions/ Functional Status: Activity: activity as tolerated with assistance Diet: cardiac diet--pureed, honey thick  Wound Care: none needed   Functional status:  ___ No restrictions     ___ Walk up steps independently _X__ 24/7 supervision/assistance   ___ Walk up steps with assistance ___ Intermittent supervision/assistance  ___ Bathe/dress independently ___ Walk with walker     _X__ Bathe/dress with assistance ___ Walk Independently    ___ Shower independently ___ Walk with assistance    ___ Shower with assistance _X__ No alcohol     ___ Return to work/school ________   COMMUNITY REFERRALS UPON DISCHARGE:    Home Health:   PT     OT     ST                   Agency: [facility]  Phone: [phone]    Medical Equipment/Items Ordered: Bedside Commode, Transfer Bench, Hemi Walker, Wheelchair, [place], Suction Kit                                                 Agency/Supplier: [employer] [id number]   Special Instructions:  STROKE/TIA DISCHARGE INSTRUCTIONS SMOKING Cigarette smoking nearly doubles your risk of having a stroke & is the single most alterable risk factor  If you smoke or have smoked in the last 12 months, you are advised to quit smoking for your health. Most of the excess cardiovascular risk related to smoking disappears within a year of stopping. Ask you doctor about anti-smoking medications [facility] Quit Line: 1-800-QUIT NOW Free Smoking Cessation Classes [phone]  CHOLESTEROL Know your levels; limit fat & cholesterol in your diet  Lipid Panel     Component Value Date/Time   CHOL 105 02/08/2022 0540   CHOL 114 02/13/2021 1150   TRIG 47 02/08/2022 0540   HDL 62 02/08/2022 0540   HDL 53 02/13/2021 1150   CHOLHDL 1.7 02/08/2022 0540   VLDL 9 02/08/2022 0540   LDLCALC 34 02/08/2022 0540   LDLCALC 43 02/13/2021 1150     Many patients benefit from  treatment even if their cholesterol is at goal. Goal: Total Cholesterol (CHOL) less than 160 Goal:  Triglycerides (TRIG) less than 150 Goal:  HDL greater than 40 Goal:  LDL (LDLCALC) less than 100   BLOOD PRESSURE American Stroke Association blood pressure target is less that 120/80 mm/Hg  Your discharge blood pressure is:  BP: 124/61 Monitor your blood pressure Limit your salt and alcohol intake Many individuals will require more than one medication for high blood pressure  DIABETES (A1c is a blood sugar average for last 3 months) Goal HGBA1c is under 7% (HBGA1c is blood sugar average for last 3 months)  Diabetes: No known diagnosis of diabetes    Lab Results  Component Value Date   HGBA1C 5.4 02/07/2022    Your HGBA1c can be lowered with medications, healthy diet, and exercise. Check your blood sugar as directed by your physician Call your physician if you experience unexplained or low blood sugars.  PHYSICAL ACTIVITY/REHABILITATION Goal is 30 minutes at least 4 days per week  Activity: Increase activity slowly, and No driving, Therapies: see above Return to work: N/A Activity decreases your risk of heart attack and stroke and makes your heart stronger.  It helps control  your weight and blood pressure; helps you relax and can improve your mood. Participate in a regular exercise program. Talk with your doctor about the best form of exercise for you (dancing, walking, swimming, cycling).  DIET/WEIGHT Goal is to maintain a healthy weight  Your discharge diet is:  Diet Order             DIET DYS 2 Room service appropriate? Yes; Fluid consistency: Honey Thick  Diet effective 0500                   liquids Your height is:  Height: $Remove'5\' 5"'xyXoPSC$  (165.1 cm) Your current weight is: Weight: 64.5 kg Your Body Mass Index (BMI) is:  BMI (Calculated): 23.66 Following the type of diet specifically designed for you will help prevent another stroke. You are at goal weight   Your goal Body Mass  Index (BMI) is 19-24. Healthy food habits can help reduce 3 risk factors for stroke:  High cholesterol, hypertension, and excess weight.  RESOURCES Stroke/Support Group:  Call [phone]   STROKE EDUCATION PROVIDED/REVIEWED AND GIVEN TO PATIENT Stroke warning signs and symptoms How to activate emergency medical system (call 911). Medications prescribed at discharge. Need for follow-up after discharge. Personal risk factors for stroke. Pneumonia vaccine given:  Flu vaccine given:  My questions have been answered, the writing is legible, and I understand these instructions.  I will adhere to these goals & educational materials that have been provided to me after my discharge from the hospital.       My questions have been answered and I understand these instructions. I will adhere to these goals and the provided educational materials after my discharge from the hospital.  Patient/Caregiver Signature _______________________________ Date __________  Clinician Signature _______________________________________ Date __________  Please bring this form and your medication list with you to all your follow-up doctor's appointments.

## 2022-03-07 ENCOUNTER — Inpatient Hospital Stay (HOSPITAL_COMMUNITY): Payer: Medicare Other

## 2022-03-07 NOTE — Progress Notes (Signed)
Patient ID: Paul Bradshaw, male   DOB: [birth year], 88 y.o.   MRN: [id number]  Additional family education scheduled 9/19 and 9/20 1-4 PM

## 2022-03-07 NOTE — Progress Notes (Signed)
PROGRESS NOTE   Subjective/Complaints:  Pt seen and evaluated at bedside. Aphasic, responds to commands in the LEs but not the UEs.   Per OT, when evaluating pt's R wrist, she asked if he fell on it to which he excitedly responded "yes yes yes!". Wrist has been exquisitely tender on the dorsal aspect. No imaging per chart review.   ROS: Limited due to language/communication   Objective:   No results found. No results for input(s): "WBC", "HGB", "HCT", "PLT" in the last 72 hours.    No results for input(s): "NA", "K", "CL", "CO2", "GLUCOSE", "BUN", "CREATININE", "CALCIUM" in the last 72 hours.    Intake/Output Summary (Last 24 hours) at 03/07/2022 1027 Last data filed at 03/07/2022 0855 Gross per 24 hour  Intake 594 ml  Output --  Net 594 ml      Pressure Injury 02/07/22 Coccyx Medial Stage 1 -  Intact skin with non-blanchable redness of a localized area usually over a bony prominence. (Active)  02/07/22 1327  Location: Coccyx  Location Orientation: Medial  Staging: Stage 1 -  Intact skin with non-blanchable redness of a localized area usually over a bony prominence.  Wound Description (Comments):   Present on Admission: Yes    Physical Exam: Vital Signs Blood pressure 121/64, pulse 81, temperature 97.6 F (36.4 C), temperature source Oral, resp. rate 16, height 5\' 5"  (1.651 m), weight 64.5 kg, SpO2 97 %.   General: No acute distress Psych: Mood and affect are appropriate Heart: Regular rate and rhythm no rubs murmurs or extra sounds Lungs: Clear to auscultation, breathing unlabored, no rales or wheezes Abdomen: Positive bowel sounds, soft nontender to palpation, nondistended Extremities: No clubbing, cyanosis, or edema Skin: No evidence of breakdown, no evidence of rash  MSK- moves BL LE on command, LUE in bed; no purposeful movement of RUE.   Neurologic: Awake, alert, does not respond to orientation  quesitons. Does nod Y/N.  Responsive and localizing to pain except in RUE.      Assessment/Plan: 1. Functional deficits which require 3+ hours per day of interdisciplinary therapy in a comprehensive inpatient rehab setting. Physiatrist is providing close team supervision and 24 hour management of active medical problems listed below. Physiatrist and rehab team continue to assess barriers to discharge/monitor patient progress toward functional and medical goals  Care Tool:  Bathing    Body parts bathed by patient: Chest, Face, Abdomen, Right upper leg, Left upper leg   Body parts bathed by helper: Right arm, Left arm, Buttocks, Front perineal area, Right lower leg, Left lower leg     Bathing assist Assist Level: Moderate Assistance - Patient 50 - 74%     Upper Body Dressing/Undressing Upper body dressing   What is the patient wearing?: Pull over shirt    Upper body assist Assist Level: Moderate Assistance - Patient 50 - 74%    Lower Body Dressing/Undressing Lower body dressing      What is the patient wearing?: Incontinence brief, Pants     Lower body assist Assist for lower body dressing: Maximal Assistance - Patient 25 - 49%     Toileting Toileting    Toileting assist Assist for toileting:  Total Assistance - Patient < 25%     Transfers Chair/bed transfer  Transfers assist  Chair/bed transfer activity did not occur: Safety/medical concerns  Chair/bed transfer assist level: Moderate Assistance - Patient 50 - 74%     Locomotion Ambulation   Ambulation assist   Ambulation activity did not occur: Safety/medical concerns          Walk 10 feet activity   Assist  Walk 10 feet activity did not occur: Safety/medical concerns        Walk 50 feet activity   Assist Walk 50 feet with 2 turns activity did not occur: Safety/medical concerns         Walk 150 feet activity   Assist Walk 150 feet activity did not occur: Safety/medical concerns          Walk 10 feet on uneven surface  activity   Assist Walk 10 feet on uneven surfaces activity did not occur: Safety/medical concerns         Wheelchair     Assist Is the patient using a wheelchair?: Yes (Did not use one PTA) Type of Wheelchair: Manual Wheelchair activity did not occur: Safety/medical concerns         Wheelchair 50 feet with 2 turns activity    Assist    Wheelchair 50 feet with 2 turns activity did not occur: Safety/medical concerns       Wheelchair 150 feet activity     Assist  Wheelchair 150 feet activity did not occur: Safety/medical concerns       Blood pressure 121/64, pulse 81, temperature 97.6 F (36.4 C), temperature source Oral, resp. rate 16, height 5\' 5"  (1.651 m), weight 64.5 kg, SpO2 97 %.  Medical Problem List and Plan: 1. 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  Antithrombotics: -DVT/anticoagulation:  Pharmaceutical: Lovenox             -antiplatelet therapy: DAPT X 3 months followed by ASA alone.  3. Pain: continue Tylenol prn.  CVA related pain developing tone in RIght finger an wrist flexors, cont ROM, premedicate with tylenol discussed with RN and OT- trial Voltaren gel               - R wrist xray ordered; NWB and in resting splint until fracture can be ruled out 9/15  4. Mood/Behavior/Sleep: LCSW to follow for evaluation and support.              -antipsychotic agents: N/A 5. Neuropsych/cognition: This patient is not fully capable of making decisions on his own behalf due to aphasia. 6. Skin/Wound Care: Routine pressure relief measures.  7. Fluids/Electrolytes/Nutrition: Monitor I/O- oral intake ok          HypoK+ resolved  on supplements     Latest Ref Rng & Units 03/03/2022    8:00 AM 02/25/2022    6:38 AM 02/21/2022    7:54 AM  BMP  Glucose 70 - 99 mg/dL 04/23/2022  419    BUN 8 - 23 mg/dL 25  24    Creatinine 0.61 - 1.24 mg/dL 1.53  7.94    Sodium 327 - 145 mmol/L 134  134    Potassium 3.5 - 5.1 mmol/L 3.8  4.2  4.2   Chloride 98 - 111 mmol/L 100  97    CO2 22 - 32 mmol/L 27  26    Calcium 8.9 - 10.3 mg/dL 9.0  8.5      HypoK+ will supplement , not on diuretics but had several stools on 8/27  9/2- last K+ 4.2- doing well 8. L-MCA infarct with hemorrhagic conversion: DAPT X 90 day followed by ASA alone 9. HTN: Monitor BP TID. BP remains labile. Continue Proscar.  --Continue lopressor to 12.5mg  BID given hypotension  Vitals:   03/06/22 1938 03/07/22 0433  BP: (!) 119/58 121/64  Pulse: 87 81  Resp: 17 16  Temp: 98.2 F (36.8 C) 97.6 F (36.4 C)  SpO2: 100% 97%   Controlled 9/14 10. Dysphagia: Continue D1, honey thick liquids. Needs assistance for feeding and supervision for safety.  --hypernatremia/AKI likely due to dysphagia diet 11. BPH: Monitor for any voiding difficulties. Will order PVR checks as off his meds.  --Proscar was not resumed. Flomax was d/c on 08/20.   13. Covid + asymptomatic,finished 10d in room quarantine on 9/8    Latest Ref Rng & Units 03/03/2022    8:00 AM 02/27/2022    5:35 AM 02/26/2022    6:42 AM  CBC  WBC 4.0 - 10.5 K/uL 7.3  9.4  11.1   Hemoglobin 13.0 - 17.0 g/dL 61.4  70.9  29.5   Hematocrit 39.0 - 52.0 % 35.1  32.4  34.4   Platelets 150 - 400 K/uL 395  345  340   Leukocytosis resolved  14. Apneic episodes on continuous pulse ox while sleeping, does recover on own, consulted RT - aphasic cannot ask about am HA or other symptoms    LOS: 18 days A FACE TO FACE EVALUATION WAS PERFORMED  Paul Bradshaw 03/07/2022, 10:27 AM

## 2022-03-07 NOTE — Progress Notes (Signed)
Slept very well last night. Remains incontinent. Toileted and made comfortable. No new changes to report. Safety ensured at all times.

## 2022-03-07 NOTE — Progress Notes (Addendum)
Speech Language Pathology Daily Session Note  Patient Details  Name: [name] MRN: [id number] Date of Birth: [birth year]  Today's Date: 03/07/2022 SLP Individual Time: 0904-1000 SLP Individual Time Calculation (min): 56 min  Short Term Goals: Week 3: SLP Short Term Goal 1 (Week 3): STG=LTG due to ELOS  Skilled Therapeutic Interventions: Skilled ST treatment focused on language and swallowing goals. Pt greeted upright in wheelchair on arrival and waved to SLP upon entry. Pt was alert throughout session however appeared less interactive as compared to previous session.  SLP faciliated object identification using field of 2 common objects from the [place]. Pt achieved 30% accuracy given max A multimodal cues including verbal prompt, semantic description, gestures, tactile cues, written word, yes/no questions, and extended time. It appeared pt benefited from placing items well into left visual field. Pt had tendency to select items placed on far left therefore there is a possibility for some false positives. Similar to previous sessions, pt appeared confused by task expectations and required max A verbal/visual demonstration/tactile cues to use left hand to point to/select objects, as well as for following 1-step commands.  SLP facilitated singing tasks with max A multimodal cues in order for pt to elicit vocalization x1 following cloze phrase and expectant look. Pt with no other attempts to vocalize this date. Vocal quality was perceived as rough, breathy, and low vocal intensity which is consistent with previous encounters.   Pt performed oral care using suction toothbrush and sponge with max A. Donned upper denture with max A. Unable to facilitate PO trials secondary to time constraints.  Patient was left in wheelchair with alarm activated and immediate needs within reach at end of session. Continue per current plan of care.       Pain No evidence of pain or discomfort throughout session.    Therapy/Group: Individual Therapy  Patty Sermons 03/07/2022, 9:21 AM

## 2022-03-07 NOTE — Progress Notes (Signed)
Physical Therapy Session Note  Patient Details  Name: Paul Bradshaw MRN: [id number] Date of Birth: [birth year]  Today's Date: 03/07/2022 PT Individual Time: 0831-0900 PT Individual Time Calculation (min): 29 min   Short Term Goals:  Week 2:  PT Short Term Goal 1 (Week 2): Pt will perform bed mobility with overall CGA and using no bed features. PT Short Term Goal 2 (Week 2): Pt will perform all functional transfers with light MinA/ CGA and LRAD. PT Short Term Goal 3 (Week 2): Pt will ambulate at least 40 ft using LRAD with CGA/ MinA. PT Short Term Goal 4 (Week 2): Pt will initiate stair training.  Skilled Therapeutic Interventions/Progress Updates:   Pt received supine in bed and agreeable to PT. Supine>sit transfer with mod assist at trunk on the R side of bed. Scooting EOB with min assist with cues for reciprocal pattern with poor follow through for reciprocal pattern. Ambulatory transfer to [facility] over toilet with RW and min assist for balance and mod assist for AD management and R hand splint. Mild discomfort noted in R wrist with positioning of hand in splint. Pt able to void bowel and bladder sitting on toilet. Cllothing management and pericare completed by PT  Siting balance on toilet without BLE support and supervision assist for safety. Ambulatory transfer to [facility] with RW and hand splint x 12 ft with min assist from PT for balance and RW management. Pt sitting in WC with call bell in reach and all needs met.        Therapy Documentation Precautions:  Precautions Precautions: Fall Precaution Comments: R hemipareisis, expressive>receptive aphasia Restrictions Weight Bearing Restrictions: No  Pain:  Faces: hurts a little. R wrist. Hand repositioned   Therapy/Group: Individual Therapy  Lorie Phenix 03/07/2022, 9:02 AM

## 2022-03-07 NOTE — Progress Notes (Signed)
Occupational Therapy Session Note  Patient Details  Name: Paul Bradshaw MRN: [id number] Date of Birth: 13-Apr-1926  Today's Date: 03/07/2022 OT Individual Time: 1045-1200 OT Individual Time Calculation (min): 75 min    Short Term Goals: Week 3:  OT Short Term Goal 1 (Week 3): STGs = LTGs  Skilled Therapeutic Interventions/Progress Updates:    Pt  received in wc, dressed and ready for the day.  Pt taken to gym to transfer to mat with stand pivot with min A.  Used a long sheet as a sling to support arm during transfer and during standing activities.  Worked on static stands trying to have pt lift his head to look forward into the mirror, but pt stayed in a hunched position.   In seated, placed arm on table for a/arom of RUE.  If his wrist was slightly touched, he grimaced a great deal and could not tolerate having his hand lifted without grimacing. Tried to facilitate hand over hand guiding of grasping cones but when his hand was slid over the cone he grimaced again. (Notified MD). Pt is essentially non verbal and only makes grunts and will say yes.  When I asked him he had fallen on his R side when he had his stroke, he got very excited and animated and said "yes, yes, yes" pointing at me and smiling as if I had finally figured out why he was having the pain.   Focused on proximal movement using table top slides of arm on towel on table.  Pt was able to elicit some proximal control to move arm forward slightly and bend elbow slightly.  Pt seemed uncomfortable sitting so asked if he needed to toilet. Pt worked on stand pivot transfers to wc to toilet and back with increased cues today and min A.  Pt sat for a few minutes but no void.    His current lap tray does not fit on his wc well so wrapped blankets to create an arm support for pt.    Nurse tech needed to get pt into bed so cued her how to facilitate patient.  Assisted her with scooting him up in bed.    Pt in bed with nurse tech for  bladder scan.   Therapy Documentation Precautions:  Precautions Precautions: Fall Precaution Comments: R hemipareisis, expressive>receptive aphasia Restrictions Weight Bearing Restrictions: No   Pain: significant grimacing when R wrist is touched or moved, informed RN and MD   ADL: ADL Eating: Moderate assistance Grooming: Moderate cueing, Supervision/safety Where Assessed-Grooming: Edge of bed Upper Body Bathing: Minimal assistance Where Assessed-Upper Body Bathing: Shower Lower Body Bathing: Moderate assistance, Moderate cueing (Pt able to bathe 2.5/5 body parts while seated on tub-bench in the shower and standing with minimal assist with use of grab bars while therapist provides assist to bathe buttocks. Pt able to bathe B thighs & front peri-area with mod VCs for sequencing) Where Assessed-Lower Body Bathing: Shower Upper Body Dressing: Maximal assistance (Pt requires maximal assistance to doff a pull-over and button up shirt while seated on tub-bench. OT provides hand-over-hand and VCs for education on hemi-dressing techniques. Pt dons a gown after shower with maximal assist due to fatigue.) Where Assessed-Upper Body Dressing:  (tub-bench) Lower Body Dressing: Maximal assistance (Pt requires maximal assistance to doff pants and brief while standing with minimal assist and use of grab bar in the shower while pulling clothing down.) Where Assessed-Lower Body Dressing:  (tub-bench) Toileting: Dependent Where Assessed-Toileting:  (shower) Toilet Transfer: Minimal assistance Toilet  Transfer Method: Stand pivot Acupuncturist: Raised toilet seat, Acupuncturist: Minimal assistance Film/video editor Method: Warden/ranger: Emergency planning/management officer, Grab bars ADL Comments: Pt has been improving with ADL mobility    Therapy/Group: Individual Therapy  Lakeith Careaga 03/07/2022, 12:09 PM

## 2022-03-07 NOTE — Progress Notes (Signed)
Orthopedic Tech Progress Note Patient Details:  Paul Bradshaw [birth year] [id number]  Patient ID: Paul Bradshaw, male   DOB: 11-Jul-1925, 88 y.o.   MRN: [id number] Order placed with [facility] for wrist cock up splint. Darleen Crocker 03/07/2022, 5:20 PM

## 2022-03-07 NOTE — Progress Notes (Signed)
Patient son called with Bathroom measurements for home. Door on 24inches Door off 26inches

## 2022-03-07 NOTE — Progress Notes (Signed)
Physical Therapy Session Note  Patient Details  Name: Paul Bradshaw MRN: [id number] Date of Birth: 11-30-1925  Today's Date: 03/07/2022 PT Individual Time: 6063-0160 PT Individual Time Calculation (min): 48 min   Short Term Goals: Week 1:  PT Short Term Goal 1 (Week 1): Pt will perform bed mobility with overall CGA and using no bed features. PT Short Term Goal 1 - Progress (Week 1): Progressing toward goal PT Short Term Goal 2 (Week 1): Pt will perform all functional transfers with light MinA/ CGA and LRAD. PT Short Term Goal 2 - Progress (Week 1): Progressing toward goal PT Short Term Goal 3 (Week 1): Pt will ambulate at least 40 ft using LRAD with CGA/ MinA. PT Short Term Goal 3 - Progress (Week 1): Progressing toward goal PT Short Term Goal 4 (Week 1): Pt will initiate stair training. PT Short Term Goal 4 - Progress (Week 1): Progressing toward goal PT Short Term Goal 5 (Week 1): Pt will perform Berg Balance test. PT Short Term Goal 5 - Progress (Week 1): Progressing toward goal  Skilled Therapeutic Interventions/Progress Updates:  Patient supine in bed on entrance to room. NT present and taking vitals. Short stretch wrap acquired to provide support to R wrist on request form OT who appreciated pt's pain earlier in day and ascertained that pt sustained a fall when he had his stroke. Potential for R wrist injury causing pain with all movement and palpation. Patient alert and agreeable to PT session.   Patient with no pain complaint at start of session until R wrist is touched for application of supportive wrap.   Therapeutic Activity: Bed Mobility: Pt performed supine --> sit with close supervision and vc/ tc required for initiation and technique. Requires extra time and bed features to complete. Is able to demo forward scoot with CGA.  Transfers: Pt performed sit<>stand transfers throughout session from EOB with ModA initially and improving to MinA. Provided verbal cues for technique. R  wrist limiting pt's performance just d/t increased pain from movement during application of supportive wrap with short stretch wrap. Attempted stand pivot with pt unable to perform step with RLE and moving to sit at EOB each attempt.   Neuromuscular Re-ed: NMR facilitated during session with focus on sitting balance and standing balance. Pt guided in seated reaches to targets placed at edge of BOS in order to improve forward reach/ lean to improve transfers. In standing pt guided in upright posture with Bil scap sets with pt only able to perform x4 prior to fatigue. Time required with vc/ tc for proper technique. Pt continues to demo pain at R wrist throughout. Attempt for pt to step R foot forward to initiate stand pivot but pt steps small step fwd/ bkwd x3 then moves to sit.  NMR performed for improvements in motor control and coordination, balance, sequencing, judgement, and self confidence/ efficacy in performing all aspects of mobility at highest level of independence.   Pt demonstrating fatigue with close of eyes and move to return to bed.   Patient supine at end of session with brakes locked, bed alarm set, and all needs within reach. RUE placed on pillow prop with wrap loosened for comfort and continued support.   Appreciate imaging report noting no bony injury, however on inspection of imaging, space noted in image with potential for soft tissue ligamentous stretch/ injury to ulnar/ carpal side of wrist. Pt will require continued wrist support to prevent any further injury.   Pt missed 27 min  of skilled therapy due to pain and fatigue. Will re-attempt as schedule and pt availability permits.   Therapy Documentation Precautions:  Precautions Precautions: Fall Precaution Comments: R hemipareisis, expressive>receptive aphasia Restrictions Weight Bearing Restrictions: No General:   Vital Signs: Therapy Vitals Temp: 98.3 F (36.8 C) Temp Source: Oral Pulse Rate: 82 Resp: 18 BP: (!)  96/57 Patient Position (if appropriate): Lying Oxygen Therapy SpO2: 99 % O2 Device: Room Air Pain: Significant grimacing when R wrist is palpated or moved, informed RN of wrap placed on wrist and to provide pt with fluids soon.    Therapy/Group: Individual Therapy  Loel Dubonnet PT, DPT, CSRS 03/07/2022, 5:36 PM

## 2022-03-09 NOTE — Progress Notes (Signed)
PROGRESS NOTE   Subjective/Complaints:  Pt seen and evaluated at bedside. Aphasic, holding R hand and withdrawing, making pained sounds when it is moved to try and place in [facility].   LBM 9/17.   ROS: Limited due to language/communication   Objective:   DG Wrist 2 Views Right  Result Date: 03/07/2022 CLINICAL DATA:  Right wrist pain EXAM: RIGHT WRIST - 2 VIEW COMPARISON:  None Available. FINDINGS: No acute fracture or dislocation. Generalized osteopenia. Severe osteoarthritis of the first [facility] joint. Moderate osteoarthritis of the scaphotrapeziotrapezoid joint. Mild osteoarthritis of the first MCP joint. Moderate osteoarthritis of the first IP joint. Mild osteoarthritis of the distal radioulnar joint. Soft tissues are unremarkable. Peripheral vascular atherosclerotic disease. IMPRESSION: 1. No acute osseous injury of the right wrist. 2. Osteoarthritis of the right wrist as described above. Electronically Signed   By: Elige Ko M.D.   On: 03/07/2022 12:41   No results for input(s): "WBC", "HGB", "HCT", "PLT" in the last 72 hours.    No results for input(s): "NA", "K", "CL", "CO2", "GLUCOSE", "BUN", "CREATININE", "CALCIUM" in the last 72 hours.    Intake/Output Summary (Last 24 hours) at 03/09/2022 0743 Last data filed at 03/08/2022 1840 Gross per 24 hour  Intake 660 ml  Output --  Net 660 ml      Pressure Injury 02/07/22 Coccyx Medial Stage 1 -  Intact skin with non-blanchable redness of a localized area usually over a bony prominence. (Active)  02/07/22 1327  Location: Coccyx  Location Orientation: Medial  Staging: Stage 1 -  Intact skin with non-blanchable redness of a localized area usually over a bony prominence.  Wound Description (Comments):   Present on Admission: Yes    Physical Exam: Vital Signs Blood pressure (!) 120/58, pulse 72, temperature 98.5 F (36.9 C), temperature source Oral, resp. rate 18, height 5'  5" (1.651 m), weight 64.5 kg, SpO2 96 %.   General: No acute distress Psych: Mood and affect are appropriate Heart: Regular rate and rhythm no rubs murmurs or extra sounds Lungs: Clear to auscultation, breathing unlabored, no rales or wheezes Abdomen: Positive bowel sounds, soft nontender to palpation, nondistended Extremities: Nonpitting edema R hand, wrist.  Skin: No evidence of breakdown, no evidence of rash. No erythema or warmth.  Neurologic: Awake, alert, does not respond to orientation quesitons. Does nod Y/N.      Assessment/Plan: 1. Functional deficits which require 3+ hours per day of interdisciplinary therapy in a comprehensive inpatient rehab setting. Physiatrist is providing close team supervision and 24 hour management of active medical problems listed below. Physiatrist and rehab team continue to assess barriers to discharge/monitor patient progress toward functional and medical goals  Care Tool:  Bathing    Body parts bathed by patient: Chest, Face, Abdomen, Right upper leg, Left upper leg   Body parts bathed by helper: Right arm, Left arm, Buttocks, Front perineal area, Right lower leg, Left lower leg     Bathing assist Assist Level: Moderate Assistance - Patient 50 - 74%     Upper Body Dressing/Undressing Upper body dressing   What is the patient wearing?: Pull over shirt    Upper body assist Assist Level:  Moderate Assistance - Patient 50 - 74%    Lower Body Dressing/Undressing Lower body dressing      What is the patient wearing?: Incontinence brief, Pants     Lower body assist Assist for lower body dressing: Maximal Assistance - Patient 25 - 49%     Toileting Toileting    Toileting assist Assist for toileting: Total Assistance - Patient < 25%     Transfers Chair/bed transfer  Transfers assist  Chair/bed transfer activity did not occur: Safety/medical concerns  Chair/bed transfer assist level: Moderate Assistance - Patient 50 - 74%      Locomotion Ambulation   Ambulation assist   Ambulation activity did not occur: Safety/medical concerns          Walk 10 feet activity   Assist  Walk 10 feet activity did not occur: Safety/medical concerns        Walk 50 feet activity   Assist Walk 50 feet with 2 turns activity did not occur: Safety/medical concerns         Walk 150 feet activity   Assist Walk 150 feet activity did not occur: Safety/medical concerns         Walk 10 feet on uneven surface  activity   Assist Walk 10 feet on uneven surfaces activity did not occur: Safety/medical concerns         Wheelchair     Assist Is the patient using a wheelchair?: Yes (Did not use one PTA) Type of Wheelchair: Manual Wheelchair activity did not occur: Safety/medical concerns         Wheelchair 50 feet with 2 turns activity    Assist    Wheelchair 50 feet with 2 turns activity did not occur: Safety/medical concerns       Wheelchair 150 feet activity     Assist  Wheelchair 150 feet activity did not occur: Safety/medical concerns       Blood pressure (!) 120/58, pulse 72, temperature 98.5 F (36.9 C), temperature source Oral, resp. rate 18, height 5\' 5"  (1.651 m), weight 64.5 kg, SpO2 96 %.  Medical Problem List and Plan: 1. 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  Antithrombotics: -DVT/anticoagulation:  Pharmaceutical: Lovenox             -antiplatelet therapy: DAPT X 3 months followed by ASA alone.  3. Pain: continue Tylenol prn.  CVA related pain developing tone in RIght finger an wrist flexors, cont ROM, premedicate with tylenol discussed with RN and OT- trial  Voltaren gel               - R wrist xray ordered; NWB and in resting splint until fracture can be ruled out 9/15              - 9/16 - Xray with arthritis, worse at first [facility] joint. No Fx. Placed order for cock up splint.               - 9/17 - increased discomfort in R hand;  ordered compression garment 4. Mood/Behavior/Sleep: LCSW to follow for evaluation and support.              -antipsychotic agents: N/A 5. Neuropsych/cognition: This patient is not fully capable of making decisions on his own behalf due to aphasia. 6. Skin/Wound Care: Routine pressure relief measures.  7. Fluids/Electrolytes/Nutrition: Monitor I/O- oral intake ok          HypoK+ resolved on supplements     Latest Ref Rng & Units 03/03/2022    8:00 AM 02/25/2022    6:38 AM 02/21/2022    7:54 AM  BMP  Glucose 70 - 99 mg/dL 128  117    BUN 8 - 23 mg/dL 25  24    Creatinine 0.61 - 1.24 mg/dL 0.83  1.01    Sodium 135 - 145 mmol/L 134  134    Potassium 3.5 - 5.1 mmol/L 3.8  4.2  4.2   Chloride 98 - 111 mmol/L 100  97    CO2 22 - 32 mmol/L 27  26    Calcium 8.9 - 10.3 mg/dL 9.0  8.5      HypoK+ will supplement , not on diuretics but had several stools on 8/27  9/2- last K+ 4.2- doing well 8. L-MCA infarct with hemorrhagic conversion: DAPT X 90 day followed by ASA alone 9. HTN: Monitor BP TID. BP remains labile. Continue Proscar.  --Continue lopressor to 12.5mg  BID given hypotension  Vitals:   03/08/22 1937 03/09/22 0437  BP: 128/61 (!) 120/58  Pulse: 81 72  Resp: 20 18  Temp: 98.4 F (36.9 C) 98.5 F (36.9 C)  SpO2: 100% 96%   Controlled 9/14  10. Dysphagia: Continue D1, honey thick liquids. Needs assistance for feeding and supervision for safety.  --hypernatremia/AKI likely due to dysphagia diet 11. BPH: Monitor for any voiding difficulties. Will order PVR checks as off his meds.  --Proscar was not resumed. Flomax was d/c on 08/20.   13. Covid + asymptomatic,finished 10d in room quarantine on 9/8    Latest  Ref Rng & Units 03/03/2022    8:00 AM 02/27/2022    5:35 AM 02/26/2022    6:42 AM  CBC  WBC 4.0 - 10.5 K/uL 7.3  9.4  11.1   Hemoglobin 13.0 - 17.0 g/dL 11.6  10.7  11.3   Hematocrit 39.0 - 52.0 % 35.1  32.4  34.4   Platelets 150 - 400 K/uL 395  345  340   Leukocytosis resolved   14. Apneic episodes on continuous pulse ox while sleeping, does recover on own, consulted RT - aphasic cannot ask about am HA or other symptoms    LOS: 20 days A FACE TO [place] 03/09/2022, 7:43 AM

## 2022-03-09 NOTE — Progress Notes (Signed)
Speech Language Pathology Daily Session Note  Patient Details  Name: Paul Bradshaw MRN: [id number] Date of Birth: 07/02/1925  Today's Date: 03/09/2022 SLP Individual Time: 1040-1120 SLP Individual Time Calculation (min): 40 min  Short Term Goals: Week 3: SLP Short Term Goal 1 (Week 3): STG=LTG due to ELOS  Skilled Therapeutic Interventions:  Pt was seen for skilled ST targeting goals for functional communication.  Pt was awake, alert, and upright for today's treatment session.  Pt remained essentially nonverbal throughout today's therapy sessions but had 1-2 instances of initiating vocalizations in an attempt to convey something to therapist.  Pt needed max to total assist to match object to word from a field of two.  When asked to write his name pt could trace 2 out of 5 letters of his name legibly in context, which improved to 5 out of 5 legibility with hand over hand assist and following extensive practice of prewriting skills (drawing lines and shapes needed to make targeted letters in isolation, ie diagonal lines, curved lines, vertical lines, and circles) and practice at the letter level.  Pt was left in bed with bed alarm set and call bell within reach.  Continue per current plan of care.    Pain Pain Assessment Pain Scale: 0-10 Pain Score: 0-No pain  Therapy/Group: Individual Therapy  Samanth Mirkin, Selinda Orion 03/09/2022, 12:48 PM

## 2022-03-10 LAB — CBC
HCT: 32.8 % — ABNORMAL LOW (ref 39.0–52.0)
Hemoglobin: 10.7 g/dL — ABNORMAL LOW (ref 13.0–17.0)
MCH: 31.5 pg (ref 26.0–34.0)
MCHC: 32.6 g/dL (ref 30.0–36.0)
MCV: 96.5 fL (ref 80.0–100.0)
Platelets: 237 10*3/uL (ref 150–400)
RBC: 3.4 MIL/uL — ABNORMAL LOW (ref 4.22–5.81)
RDW: 12.3 % (ref 11.5–15.5)
WBC: 7.5 10*3/uL (ref 4.0–10.5)
nRBC: 0 % (ref 0.0–0.2)

## 2022-03-10 LAB — BASIC METABOLIC PANEL
Anion gap: 8 (ref 5–15)
BUN: 24 mg/dL — ABNORMAL HIGH (ref 8–23)
CO2: 27 mmol/L (ref 22–32)
Calcium: 8.6 mg/dL — ABNORMAL LOW (ref 8.9–10.3)
Chloride: 100 mmol/L (ref 98–111)
Creatinine, Ser: 0.87 mg/dL (ref 0.61–1.24)
GFR, Estimated: >60 mL/min (ref >60)
Glucose, Bld: 104 mg/dL — ABNORMAL HIGH (ref 70–99)
Potassium: 3.7 mmol/L (ref 3.5–5.1)
Sodium: 135 mmol/L (ref 135–145)

## 2022-03-10 NOTE — Progress Notes (Signed)
Physical Therapy Weekly Progress Note  Patient Details  Name: Paul Bradshaw MRN: [id number] Date of Birth: Sep 10, 1925  Beginning of progress report period: March 01, 2022 End of progress report period: March 10, 2022  Today's Date: 03/10/2022 PT Individual Time: 1103-1202 PT Individual Time Calculation (min): 59 min   Patient has partly met 3 of 4 short term goals.  Pt making appropriate progress towards goals and is on track to meet LTG. He has had variable amounts of motivation, fatigue that limites full participation in each day's therapies. But demos consistent ability to initiate participation. Pt is also limited by RUE wrist/ hand pain. He can complete bed mobility with up to supervision, but consistently requires MinA. Can perform sit<>stand transfers with up to [place] but is consistent with MinA. Stand<>pivot transfers performance is improved with decreased pain and increased attention. Can perform at [facility] level consistently for balance and min cues for sequence. Ambulation with RW is inconsistent d/t R wrist pain. New cock-up splint has improved pt's pain with palpation and by reducing extraneous movement, however cannot hold to RW handle. HW attempted this day and will require continued practice for improvement. Step negotiation has required up to [facility] and will require add'l training prior to d/c. He continues to be primarily limited by age, decreased dynamic standing balance, RUE pain, RUE and RLE weakness, and gait impairments. Family is to participate in family education to prepare for d/c home over next 2 days.   Patient continues to demonstrate the following deficits muscle weakness, decreased cardiorespiratoy endurance, impaired timing and sequencing, unbalanced muscle activation, and decreased coordination, decreased midline orientation, decreased attention to right, and decreased motor planning, decreased awareness, decreased problem solving, decreased safety awareness, decreased  memory, and delayed processing, and decreased standing balance, hemiplegia, and decreased balance strategies and therefore will continue to benefit from skilled PT intervention to increase functional independence with mobility.  Patient progressing toward long term goals..  Continue plan of care.  PT Short Term Goals Week 2:  PT Short Term Goal 1 (Week 2): Pt will perform bed mobility with overall CGA and using no bed features. PT Short Term Goal 1 - Progress (Week 2): Partly met PT Short Term Goal 2 (Week 2): Pt will perform all functional transfers with light MinA/ CGA and LRAD. PT Short Term Goal 2 - Progress (Week 2): Partly met PT Short Term Goal 3 (Week 2): Pt will ambulate at least 40 ft using LRAD with CGA/ MinA. PT Short Term Goal 3 - Progress (Week 2): Partly met PT Short Term Goal 4 (Week 2): Pt will initiate stair training. PT Short Term Goal 4 - Progress (Week 2): Progressing toward goal Week 3:  PT Short Term Goal 1 (Week 3): STG = LTG d/t ELOS  Skilled Therapeutic Interventions/Progress Updates:  Patient seated upright in w/c on entrance to room. Patient alert and agreeable to PT session. 16x16" w/c provided over weekend and pt fits w/c size well. Should be able to fit through home doorways.   Patient with no pain complaint at start of session. Wrist cockup splint has been loosely donned and is adjusted for improved support during session.   Therapeutic Activity: Transfers: Pt performed sit<>stand transfers with CGA and stand pivot transfers throughout session with CGA/ minA. All perofrmed with either HHA to LUE, hallway HR to LUE or HW to LUE. Provided verbal cues for hand placement and initiation.  Gait Training:  Pt ambulated 38 ft using hallway HR with CGA and then  introduced to [facility] using wall as LUE block to position HW for learning. Ambulates 38 ft with HW and CGA/ MinA for balance and walker mgmt. Demonstrated trunk flexion throughout. Provided vc/ tc for upright posture,  sequencing, HW placement and technique. +2 used for w/c follow.   Neuromuscular Re-ed: NMR facilitated during session with focus on standing balance, muscle activation, motor control, proprioception. Pt guided in toe touches to 2" step with RLE, then LLE and then reciprocally. Requires MinA intially and improves to CGA. Also guided in minisquats for improved positioning and body orientation. NMR performed for improvements in motor control and coordination, balance, sequencing, judgement, and self confidence/ efficacy in performing all aspects of mobility at highest level of independence.   Patient seated upright at end of session with brakes locked, belt alarm set, and all needs within reach.   Therapy Documentation Precautions:  Precautions Precautions: Fall Precaution Comments: R hemipareisis, expressive>receptive aphasia Restrictions Weight Bearing Restrictions: No General:   Vital Signs:  Pain: Pain Assessment Pain Score:  (c/o R hand/wrist pain to touch and movement)  Therapy/Group: Individual Therapy  Alger Simons PT, DPT, CSRS 03/10/2022, 12:34 PM

## 2022-03-10 NOTE — Progress Notes (Signed)
Occupational Therapy Session Note  Patient Details  Name: Paul Bradshaw MRN: [id number] Date of Birth: 12/18/1925  Today's Date: 03/10/2022 OT Individual Time: 2353-6144 OT Individual Time Calculation (min): 60 min    Short Term Goals: Week 3:  OT Short Term Goal 1 (Week 3): STGs = LTGs  Skilled Therapeutic Interventions/Progress Updates:    Pt received in bed. Worked on getting out of bed to his R side with mod A. Pt will need to get out of bed on that side.  Trialed using RW to ambulate pt to toilet. Pt needed max A and was pushing walker too far forward. It is NOT a safe way for pt to mobilize at home. He will need to do a stand pivot transfer to toilet.   Pt's briefs were wet and soiled with bowel movement. Pt sat to toilet and voided more bowel in toilet (informed NT who recorded in flow sheets).  Placed wc directly in front of pt so he could use the arm rest for support to stand up from the toilet and maintain standing for me to cleanse him and don brief.  Pt sat back down and wc repositioned so he could pivot to w/c. (Used an additional gait belt as a arm sling that supported his RUE well) In room, positioned pt next to bed with elevated rail for pt to use with L hand to stand for therapist to manage clothing over his hips. Pt continues to need max A with dressing due to postural impairments, R hemiplegia, decreased motor planning.   Pt set up at the sink to use electric razor, but he was unable to motor plan.  Total A with shaving sides of face. Razor did not work well enough for chin.   Pt did show increased R proximal movement today, spontaneously lifting arm to side.  He has difficulty executing on command.   Pt resting in wc with belt alarm and lap tray on. Pt continues with severe expressive aphasia.    Therapy Documentation Precautions:  Precautions Precautions: Fall Precaution Comments: R hemipareisis, expressive>receptive aphasia Restrictions Weight Bearing Restrictions:  No    Pain: Pain Assessment Pain Score:  (c/o R hand/wrist pain to touch and movement)       Therapy/Group: Individual Therapy  [place] 03/10/2022, 12:25 PM

## 2022-03-10 NOTE — Progress Notes (Addendum)
Patient ID: Paul Bradshaw, male   DOB: Mar 26, 1926, 88 y.o.   MRN: [id number]   Wheelchair and Hospital bed ordered through [employer]

## 2022-03-10 NOTE — Progress Notes (Signed)
PROGRESS NOTE   Subjective/Complaints:  Eating pureed breakfast no coughing , remains severely aphasic   LBM 9/17 incont.   ROS: Limited due to language/communication   Objective:   No results found. Recent Labs    03/10/22 0506  WBC 7.5  HGB 10.7*  HCT 32.8*  PLT 237      Recent Labs    03/10/22 0506  NA 135  K 3.7  CL 100  CO2 27  GLUCOSE 104*  BUN 24*  CREATININE 0.87  CALCIUM 8.6*      Intake/Output Summary (Last 24 hours) at 03/10/2022 0746 Last data filed at 03/09/2022 2009 Gross per 24 hour  Intake 540 ml  Output --  Net 540 ml      Pressure Injury 02/07/22 Coccyx Medial Stage 1 -  Intact skin with non-blanchable redness of a localized area usually over a bony prominence. (Active)  02/07/22 1327  Location: Coccyx  Location Orientation: Medial  Staging: Stage 1 -  Intact skin with non-blanchable redness of a localized area usually over a bony prominence.  Wound Description (Comments):   Present on Admission: Yes    Physical Exam: Vital Signs Blood pressure (!) 117/52, pulse 66, temperature 98 F (36.7 C), temperature source Oral, resp. rate 20, height 5\' 5"  (1.651 m), weight 64.5 kg, SpO2 98 %.   General: No acute distress Psych: Mood and affect are appropriate Heart: Regular rate and rhythm no rubs murmurs or extra sounds Lungs: Clear to auscultation, breathing unlabored, no rales or wheezes Abdomen: Positive bowel sounds, soft nontender to palpation, nondistended Extremities: Nonpitting edema R hand, wrist. MSK no evidence of right wrist or finger erythema, ecchymosis, pain with passive wrist ext   Skin: No evidence of breakdown, no evidence of rash. No erythema or warmth.  Neurologic: Awake, alert, does not respond to orientation quesitons. Does nod Y/N.  No hypersensitivity to touch RIght hand 0/5 motors RUE  4/5 RLE    Assessment/Plan: 1. Functional deficits which require  3+ hours per day of interdisciplinary therapy in a comprehensive inpatient rehab setting. Physiatrist is providing close team supervision and 24 hour management of active medical problems listed below. Physiatrist and rehab team continue to assess barriers to discharge/monitor patient progress toward functional and medical goals  Care Tool:  Bathing    Body parts bathed by patient: Chest, Face, Abdomen, Right upper leg, Left upper leg   Body parts bathed by helper: Right arm, Left arm, Buttocks, Front perineal area, Right lower leg, Left lower leg     Bathing assist Assist Level: Moderate Assistance - Patient 50 - 74%     Upper Body Dressing/Undressing Upper body dressing   What is the patient wearing?: Pull over shirt    Upper body assist Assist Level: Moderate Assistance - Patient 50 - 74%    Lower Body Dressing/Undressing Lower body dressing      What is the patient wearing?: Incontinence brief, Pants     Lower body assist Assist for lower body dressing: Maximal Assistance - Patient 25 - 49%     Toileting Toileting    Toileting assist Assist for toileting: Total Assistance - Patient < 25%  Transfers Chair/bed transfer  Transfers assist  Chair/bed transfer activity did not occur: Safety/medical concerns  Chair/bed transfer assist level: Moderate Assistance - Patient 50 - 74%     Locomotion Ambulation   Ambulation assist   Ambulation activity did not occur: Safety/medical concerns          Walk 10 feet activity   Assist  Walk 10 feet activity did not occur: Safety/medical concerns        Walk 50 feet activity   Assist Walk 50 feet with 2 turns activity did not occur: Safety/medical concerns         Walk 150 feet activity   Assist Walk 150 feet activity did not occur: Safety/medical concerns         Walk 10 feet on uneven surface  activity   Assist Walk 10 feet on uneven surfaces activity did not occur: Safety/medical  concerns         Wheelchair     Assist Is the patient using a wheelchair?: Yes (Did not use one PTA) Type of Wheelchair: Manual Wheelchair activity did not occur: Safety/medical concerns         Wheelchair 50 feet with 2 turns activity    Assist    Wheelchair 50 feet with 2 turns activity did not occur: Safety/medical concerns       Wheelchair 150 feet activity     Assist  Wheelchair 150 feet activity did not occur: Safety/medical concerns       Blood pressure (!) 117/52, pulse 66, temperature 98 F (36.7 C), temperature source Oral, resp. rate 20, height 5\' 5"  (1.651 m), weight 64.5 kg, SpO2 98 %.  Medical Problem List and Plan: 1. 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  Antithrombotics: -DVT/anticoagulation:  Pharmaceutical: Lovenox             -antiplatelet therapy: DAPT X 3 months followed by ASA alone.  3. Pain: continue Tylenol prn.  CVA related pain developing tone in RIght finger an wrist flexors, cont ROM, premedicate with tylenol discussed with RN and OT- trial Voltaren gel               - R wrist xray ordered; NWB and in resting splint until fracture can be ruled out 9/15              - 9/16 - Xray with arthritis, worse at first [facility] joint. No Fx. Placed order for cock up splint.             Has pain with ROM RUE cont voltaren gel, wrist splint at noc, prn wrist splint during the day  4. Mood/Behavior/Sleep: LCSW to follow for evaluation and support.              -antipsychotic agents: N/A 5. Neuropsych/cognition: This patient is not fully capable of making decisions on his own behalf due to aphasia. 6. Skin/Wound Care: Routine pressure relief  measures.  7. Fluids/Electrolytes/Nutrition: Monitor I/O- oral intake ok  HypoK+ resolved on supplements     Latest Ref Rng & Units 03/10/2022    5:06 AM 03/03/2022    8:00 AM 02/25/2022    6:38 AM  BMP  Glucose 70 - 99 mg/dL 104  128  117   BUN 8 - 23 mg/dL 24  25  24    Creatinine 0.61 - 1.24 mg/dL 0.87  0.83  1.01   Sodium 135 - 145 mmol/L 135  134  134   Potassium 3.5 - 5.1 mmol/L 3.7  3.8  4.2   Chloride 98 - 111 mmol/L 100  100  97   CO2 22 - 32 mmol/L 27  27  26    Calcium 8.9 - 10.3 mg/dL 8.6  9.0  8.5     HypoK+ will supplement , not on diuretics but had several stools on 8/27  9/2- last K+ 4.2- doing well 8. L-MCA infarct with hemorrhagic conversion: DAPT X 90 day followed by ASA alone 9. HTN: Monitor BP TID. BP remains labile. Continue Proscar.  --Continue lopressor to 12.5mg  BID given hypotension  Vitals:   03/09/22 2000 03/10/22 0547  BP: (!) 120/58 (!) 117/52  Pulse: 84 66  Resp: 18 20  Temp: 98.8 F (37.1 C) 98 F (36.7 C)  SpO2: 98% 98%   Controlled 9/18  10. Dysphagia: Continue D1, honey thick liquids. Needs assistance for feeding and supervision for safety.  --hypernatremia/AKI likely due to dysphagia diet 11. BPH: Monitor for any voiding difficulties. Will order PVR checks as off his meds.  --Proscar was not resumed. Flomax was d/c on 08/20.   13. Covid + asymptomatic,finished 10d in room quarantine on 9/8    Latest Ref Rng & Units 03/10/2022    5:06 AM 03/03/2022    8:00 AM 02/27/2022    5:35 AM  CBC  WBC 4.0 - 10.5 K/uL 7.5  7.3  9.4   Hemoglobin 13.0 - 17.0 g/dL 10.7  11.6  10.7   Hematocrit 39.0 - 52.0 % 32.8  35.1  32.4   Platelets 150 - 400 K/uL 237  395  345   Leukocytosis resolved   14. Apneic episodes on continuous pulse ox while sleeping, does recover on own, consulted RT - aphasic cannot ask about am HA or other symptoms    LOS: 21 days A FACE TO FACE EVALUATION WAS PERFORMED  Charlett Blake 03/10/2022, 7:46 AM

## 2022-03-10 NOTE — Progress Notes (Signed)
Nutrition Follow-up  DOCUMENTATION CODES:   Severe malnutrition in context of acute illness/injury  INTERVENTION:   -Continue feeding assistance with meals and encourage adequate PO intake -Continue Magic cup TID with meals, each supplement provides 290 kcal and 9 grams of protein  -Continue MVI with minerals daily  NUTRITION DIAGNOSIS:   Severe Malnutrition (in the context of acute illness) related to  (inadequate energy intake) as evidenced by moderate fat depletion, moderate muscle depletion, percent weight loss (11% x 1 month).  Ongoing  GOAL:   Patient will meet greater than or equal to 90% of their needs  Progressing   MONITOR:   PO intake, Labs, I & O's, Supplement acceptance, Diet advancement  REASON FOR ASSESSMENT:   Malnutrition Screening Tool    ASSESSMENT:   Pt with hx of HTN, HLD, pre-diabetes, and hx of CVAs admitted to [facility] after presenting to [facility] on 8/18 with right sided weakness and inability to talk. 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  9/13- s/p MBSS- dysphagia 1 diet with honey thick liquids  Reviewed I/O's: +540 ml x 24 hours and +7.6 L since 02/24/22   Pt sitting up in wheelchair at time of visit. He is working with PT. He remains HOH and aphasic.   Pt continues with good oral intake. Noted meal completions 90-1200%. Pt enjoys Magic Cup supplements.   Wt has been stable since admission.   Per MD notes, plan for discharge next week.   Medications reviewed and include lovenox.   Labs reviewed: CBGS: 127 (inpatient orders for glycemic control are none).    Diet Order:   Diet Order             DIET DYS 2 Room service appropriate? Yes; Fluid consistency: Honey Thick  Diet effective 0500           DIET - DYS 1 Room service appropriate? Yes; Fluid consistency: Honey Thick  Diet effective now                   EDUCATION NEEDS:   Not appropriate for education at this time  Skin:  Skin Assessment: Skin Integrity Issues: Skin  Integrity Issues:: Stage I Stage I: perineum area  Last BM:  03/09/22  Height:   Ht Readings from Last 1 Encounters:  02/17/22 5\' 5"  (1.651 m)    Weight:   Wt Readings from Last 1 Encounters:  03/03/22 64.5 kg    Ideal Body Weight:  61.8 kg  BMI:  Body mass index is 23.66 kg/m.  Estimated Nutritional Needs:   Kcal:  1600-1800 kcal/d  Protein:  80-95g/d  Fluid:  1.8-2L/d    Loistine Chance, RD, LDN, [place] Registered Dietitian II Certified Diabetes Care and Education Specialist Please refer to AMION for RD and/or RD on-call/weekend/after hours pager

## 2022-03-10 NOTE — Progress Notes (Signed)
Speech Language Pathology Daily Session Note  Patient Details  Name: Paul Bradshaw MRN: [id number] Date of Birth: 28-Jun-1925  Today's Date: 03/10/2022 SLP Individual Time: 0900-1000 SLP Individual Time Calculation (min): 60 min  Short Term Goals: Week 3: SLP Short Term Goal 1 (Week 3): STG=LTG due to ELOS  Skilled Therapeutic Interventions: Skilled ST treatment focused on dysphagia and language goals. Pt was greeted upright in wheelchair on arrival and greeted therapist with a wave. Pt maintained eye contact with SLP throughout session during communication exchange and made attempts to vocalize x3 however each attempt was incomprehensible. Pt followed basic instruction with mod A verbal/visual cues, and nodded head inconsistently to yes/no questions.   SLP facilitated therapeutic PO trials with dysphagia 2 textures. Pt exhibited improved mastication timeliness and effectiveness, improved oral control, minimal oral residuals, minimal anterior labial spillage on right, and without any pharyngeal symptoms concerning for airway invasion. Pt required mod A verbal/visual cues to implement swallowing compensations including wiping mouth d/t anterior spillage, and taking small bites/sips. Moderate feeding assist needed. Performance was demonstrative of improved oral function. SLP recommends dysphagia 2 trial tray during upcoming session(s) for ongoing efforts toward diet advancement. Pt consumed honey thick liquids by cup with mild anterior labial spillage and throat clear x1. Pt performed oral care using suction toothbrush and sponge with mod A for thoroughness; total A for denture care; pt donned upper denture with max A. Donned hearing aids with total A.   Patient was left in wheelchair and passed off to PT at end of session. Continue per current plan of care.      Pain No pain behaviors observed during session.  Therapy/Group: Individual Therapy  Shamiya Demeritt T Yovanny Coats 03/10/2022, 11:03 AM

## 2022-03-10 NOTE — Discharge Summary (Signed)
Physician Discharge Summary  Patient ID: Paul Bradshaw MRN: [id number] DOB/AGE: 86/10/1925 86 y.o.  Admit date: 02/17/2022 Discharge date: 03/13/2022  Discharge Diagnoses:  Principal Problem:   Acute ischemic left middle cerebral artery (MCA) stroke (HCC) Active Problems:   Essential hypertension   Protein-calorie malnutrition, severe   Dysphagia due to recent stroke   Aphasia due to recent cerebral infarction   Decreased hearing, bilateral   COVID-19   Prerenal azotemia   Anemia   Discharged Condition: stable  Significant Diagnostic Studies: DG Wrist 2 Views Right  Result Date: 03/07/2022 CLINICAL DATA:  Right wrist pain EXAM: RIGHT WRIST - 2 VIEW COMPARISON:  None Available. FINDINGS: No acute fracture or dislocation. Generalized osteopenia. Severe osteoarthritis of the first [facility] joint. Moderate osteoarthritis of the scaphotrapeziotrapezoid joint. Mild osteoarthritis of the first MCP joint. Moderate osteoarthritis of the first IP joint. Mild osteoarthritis of the distal radioulnar joint. Soft tissues are unremarkable. Peripheral vascular atherosclerotic disease. IMPRESSION: 1. No acute osseous injury of the right wrist. 2. Osteoarthritis of the right wrist as described above. Electronically Signed   By: Kathreen Devoid M.D.   On: 03/07/2022 12:41   DG Swallowing Func-Speech Pathology  Result Date: 03/05/2022 Table formatting from the original result was not included. Objective Swallowing Evaluation: Type of Study: MBS-Modified Barium Swallow Study  Patient Details Name: Paul Bradshaw MRN: [id number] Date of Birth: 1925-07-20 Today's Date: 03/05/2022 Past Medical History: Past Medical History: Diagnosis Date  Aortic regurgitation   Moderate  Arthritis   BPH (benign prostatic hyperplasia)   Cervical disc disease   Cervical radiculopathy   Hyperlipidemia   Hypertension   Prediabetes 02/16/2021  Staphylococcus aureus bacteremia 08/09/2012  TEE negative for vegetation or thrombus February 2014   Stroke [facility]) 2005 or 2006  3  Symptomatic carotid artery stenosis with infarction [facility]) 2005 or 2006  Status post right carotid endarterectomy Past Surgical History: Past Surgical History: Procedure Laterality Date  BACK SURGERY    CAROTID ENDARTERECTOMY    CATARACT EXTRACTION W/PHACO Left 02/15/2015  Procedure: CATARACT EXTRACTION PHACO AND INTRAOCULAR LENS PLACEMENT LEFT EYE CDE=30.93;  Surgeon: Tonny Branch, MD;  Location: [facility];  Service: Ophthalmology;  Laterality: Left;  CHOLECYSTECTOMY    EYE SURGERY    KIDNEY STONE SURGERY    LUMBAR LAMINECTOMY/DECOMPRESSION MICRODISCECTOMY Bilateral 01/23/2014  Procedure: LUMBAR LAMINECTOMY/DECOMPRESSION MICRODISCECTOMY 1 LEVEL L5-S1;  Surgeon: Charlie Pitter, MD;  Location: [place] [facility]UR[facility] ORS;  Service: Neurosurgery;  Laterality: Bilateral;  LUMBAR LAMINECTOMY/DECOMPRESSION MICRODISCECTOMY 1 LEVEL L5-S1  TEE WITHOUT CARDIOVERSION N/A 08/12/2012  Procedure: TRANSESOPHAGEAL ECHOCARDIOGRAM (TEE);  Surgeon: Josue Hector, MD;  Location: [facility];  Service: Cardiovascular;  Laterality: N/A;  TEE WITHOUT CARDIOVERSION N/A 06/24/2013  Procedure: TRANSESOPHAGEAL ECHOCARDIOGRAM (TEE);  Surgeon: Satira Sark, MD;  Location: [facility];  Service: Endoscopy;  Laterality: N/A; HPI: Paul Bradshaw is a 88 y.o. male with medical history significant of hypertension, BPH, hyperlipidemia, history of carotid artery disease and chronic back pain with radiculopathy; who presented to the emergency department secondary to aphasia, inability to walk and right-sided weakness.  Patient was last seen normal prior to bed on 02/06/2022 (around 9:10 PM). noted to have MCA infarct and several occlusions on LVO. Pt has been participating in skilled ST intervention in [place] since 08/29.  Subjective: Nonverbal  Recommendations for follow up therapy are one component of a multi-disciplinary discharge planning process, led by the attending physician.  Recommendations may be updated based on patient status,  additional functional criteria and  insurance authorization. Assessment / Plan / Recommendation   03/05/2022  12:39 PM Clinical Impressions Clinical Impression Pt was seen for a repeat MBS with overall improved oral function and mostly consistent pharyngeal deficits as compared to previous MBS obtained on 02/13/2022. Oral deficits are characterized as anterior loss of bolus on right without awareness, decreased lingual manipulation, delayed oral transit, piecemeal swallow with thicker viscosities, prolonged mastication, and premature spillage to the pyriforms with all liquid consistencies. Premature spillage resulted in silent aspiration x1 before the swallow with nectar thick liquid by cup following dys 2 bolus; cued cough response appeared ineffective in clearing aspirates. Pharyngeal deficits are characterized by delayed swallow initiation at the pyriforms with all tested consistencies, reduced tongue base retraction, decreased epiglottic inversion, and reduced laryngeal closure resulting in sensed aspiration of trace amount with thin liquids during the swallow. There were min vallecular and pyriform sinus residuals with thin, NTL, HTL, and puree. Suspect there was a component of fatigue toward end of study. Pt had reduced ability to follow commands for compensation secondary to aphasia. Given these instrumental findings and pt's risk for silent aspiration as observed during today's study, recommend continuation of dysphagia 1 diet with honey thick liquids, meds crushed in puree, and full supervision/1:1 feeding assist. May advance solids at bedside as tolerated. SLP may provide nectar thick liquids by tsp during continued SLP intervention. Given risk for dehydration with long-term use of thickened liquids and decreased quality of life, may consider water protocol of ice chips (crushed ice) and thin water via tsp sips only between meals and following extensive oral care. SLP Visit Diagnosis Dysphagia, oropharyngeal  phase (R13.12) Impact on safety and function Moderate aspiration risk;Risk for inadequate nutrition/hydration     03/05/2022  12:39 PM Treatment Recommendations Treatment Recommendations Therapy as outlined in treatment plan below     03/05/2022   1:06 PM Prognosis Prognosis for Safe Diet Advancement Fair Barriers to Reach Goals Language deficits;Severity of deficits   03/05/2022  12:39 PM Diet Recommendations SLP Diet Recommendations Dysphagia 1 (Puree) solids;Honey thick liquids Liquid Administration via Cup;Spoon Medication Administration Crushed with puree Compensations Minimize environmental distractions;Slow rate;Lingual sweep for clearance of pocketing;Monitor for anterior loss;Multiple dry swallows after each bite/sip;Clear throat intermittently;Small sips/bites Postural Changes Seated upright at 90 degrees     03/05/2022  12:39 PM Other Recommendations Oral Care Recommendations Oral care before and after PO;Staff/trained caregiver to provide oral care Other Recommendations Prohibited food (jello, ice cream, thin soups);Order thickener from pharmacy;Remove water pitcher Follow Up Recommendations Acute inpatient rehab (3hours/day) Assistance recommended at discharge Frequent or constant Supervision/Assistance Functional Status Assessment Patient has had a recent decline in their functional status and/or demonstrates limited ability to make significant improvements in function in a reasonable and predictable amount of time   02/13/2022  12:00 PM Frequency and Duration  Speech Therapy Frequency (ACUTE ONLY) min 2x/week Treatment Duration 1 week     03/05/2022  10:04 AM Oral Phase Oral Phase Impaired Oral - Pudding Teaspoon NT Oral - Pudding Cup NT Oral - Honey Teaspoon NT Oral - Honey Cup Delayed oral transit;Weak lingual manipulation;Premature spillage Oral - Nectar Teaspoon Weak lingual manipulation;Delayed oral transit;Premature spillage;Decreased bolus cohesion Oral - Nectar Cup Weak lingual  manipulation;Incomplete tongue to palate contact;Delayed oral transit;Decreased bolus cohesion;Premature spillage Oral - Nectar Straw NT Oral - Thin Teaspoon Weak lingual manipulation;Incomplete tongue to palate contact;Reduced posterior propulsion;Lingual/palatal residue;Delayed oral transit;Decreased bolus cohesion;Premature spillage Oral - Thin Cup NT Oral - Thin Straw NT Oral - Puree Weak lingual  manipulation;Delayed oral transit;Decreased bolus cohesion Oral - Mech Soft Impaired mastication;Weak lingual manipulation;Delayed oral transit;Decreased bolus cohesion Oral - Regular NT Oral - Multi-Consistency NT Oral - Pill NT    03/05/2022  12:24 PM Pharyngeal Phase Pharyngeal- Nectar Teaspoon Delayed swallow initiation-pyriform sinuses;Reduced epiglottic inversion;Reduced airway/laryngeal closure;Pharyngeal residue - pyriform;Pharyngeal residue - valleculae;Pharyngeal residue - posterior pharnyx Pharyngeal Material does not enter airway Pharyngeal- Nectar Cup Delayed swallow initiation-pyriform sinuses;Reduced epiglottic inversion;Reduced airway/laryngeal closure;Penetration/Aspiration before swallow;Pharyngeal residue - valleculae;Pharyngeal residue - pyriform Pharyngeal Material enters airway, passes BELOW cords without attempt by patient to eject out (silent aspiration);Material enters airway, passes BELOW cords and not ejected out despite cough attempt by patient Pharyngeal- Thin Teaspoon Delayed swallow initiation-pyriform sinuses;Penetration/Aspiration during swallow;Trace aspiration;Reduced airway/laryngeal closure;Reduced epiglottic inversion;Pharyngeal residue - valleculae Pharyngeal Material enters airway, passes BELOW cords without attempt by patient to eject out (silent aspiration) Pharyngeal- Puree Reduced epiglottic inversion;Reduced tongue base retraction;Pharyngeal residue - valleculae;Pharyngeal residue - pyriform;Pharyngeal residue - cp segment;Delayed swallow initiation-pyriform sinuses Pharyngeal  Material does not enter airway    03/05/2022  12:39 PM Cervical Esophageal Phase  Cervical Esophageal Phase [facility] Sonny Masters, M.S., CCC-SLP Brianne T Garretson 03/05/2022, 3:40 PM                     CT HEAD WO CONTRAST ( )  Result Date: 02/25/2022 CLINICAL DATA:  Stroke follow-up. Increased left gave his preference EXAM: CT HEAD WITHOUT CONTRAST TECHNIQUE: Contiguous axial images were obtained from the base of the skull through the vertex without intravenous contrast. RADIATION DOSE REDUCTION: This exam was performed according to the departmental dose-optimization program which includes automated exposure control, adjustment of the mA and/or kV according to patient size and/or use of iterative reconstruction technique. COMPARISON:  Head CT 02/09/2022 FINDINGS: Brain: Interval fogging and early mineralization of left frontal cortically based infarct when compared to prior. No new infarct is seen. No hemorrhage, hydrocephalus, or masslike finding. Brain atrophy and chronic small vessel ischemia. Vascular: No hyperdense vessel. Extensive atheromatous calcification of the intracranial ICA. Skull: Normal. Negative for fracture or focal lesion. Sinuses/Orbits: No acute finding. IMPRESSION: Interval evolution of the recent left MCA branch infarct. No new or acute finding. Electronically Signed   By: Tiburcio Pea M.D.   On: 02/25/2022 11:31   DG CHEST PORT 1 VIEW  Result Date: 02/24/2022 CLINICAL DATA:  Fever EXAM: PORTABLE CHEST 1 VIEW COMPARISON:  02/09/2022 FINDINGS: Transverse diameter of heart is increased. There are no signs of pulmonary edema or focal pulmonary consolidation. There is no pleural effusion or pneumothorax. There is moderate sized fixed hiatal hernia. IMPRESSION: No focal pulmonary infiltrates are seen.  Fixed hiatal hernia. Electronically Signed   By: Ernie Avena M.D.   On: 02/24/2022 12:46    Labs:  Basic Metabolic Panel:    Latest Ref Rng & Units 03/10/2022    5:06 AM  03/03/2022    8:00 AM 02/25/2022    6:38 AM  BMP  Glucose 70 - 99 mg/dL 161  096  045   BUN 8 - 23 mg/dL Creatinine 0.61 - 1.24 mg/dL 4.09  8.11  9.14   Sodium 135 - 145 mmol/L 135  134  134   Potassium 3.5 - 5.1 mmol/L 3.7  3.8  4.2   Chloride 98 - 111 mmol/L 100  100  97   CO2 22 - 32 mmol/L Calcium 8.9 - 10.3 mg/dL 8.6  9.0  8.5  CBC:    Latest Ref Rng & Units 03/10/2022    5:06 AM 03/03/2022    8:00 AM 02/27/2022    5:35 AM  CBC  WBC 4.0 - 10.5 K/uL 7.5  7.3  9.4   Hemoglobin 13.0 - 17.0 g/dL 13.010.7  86.511.6  78.410.7   Hematocrit 39.0 - 52.0 % 32.8  35.1  32.4   Platelets 150 - 400 K/uL 237  395  345      CBG: No results for input(s): "GLUCAP" in the last 168 hours.  Brief HPI:   Paul Bradshaw is a 88 y.o. male with history of BPH, prediabetes, chronic back pain with radiculopathy who was admitted via [facility] on 02/07/2022 with right-sided weakness and inability to talk secondary to acute left MCA stroke with core infarct.  He was found to have near occlusion left-ICA with acute thrombus left siphon with severe stenosis and multiple MCA branch occlusions.  Patient was felt to be a poor candidate for intervention therefore made DNR and no interventions pursued.   Neurology recommended DAPT x3 months followed by aspirin alone.  He has had issues with fevers as well as leukocytosis.  He was showing improvement in level of alertness and was neurologically stable but continued to be limited by RUE weakness, right facial droop and expressive greater than receptive aphasia.  CIR was recommended due to functional decline.    Hospital Course: Paul Bradshaw was admitted to rehab 02/17/2022 for inpatient therapies to consist of PT, ST and OT at least three hours five days a week. Past admission physiatrist, therapy team and rehab RN have worked together to provide customized collaborative inpatient rehab. Low grade fevers have resolved. Family member reported  being positive for covid day past admission and he was also found to be positive. He was placed on Covid precautions X 10 days and monitored for symptoms.  He was maintained on DAPT throughout his stay and advised to discontinue Plavix after 30 days or as advised by neurology.  Follow-up CBC did show some drop in H&H without any signs of bleeding and question due to hemodilution with improvement in renal status.    He was maintained on dysphagia 1 with honey liquids throughout his stay.  Hypokalemia has resolved with supplementation.  Transient hyponatremia has resolved.  Prerenal azotemia has improved with encouragement of p.o. fluid intake.  His diet was advanced to dysphagia 2 which she is tolerating without S/S of aspiration. His blood pressures were monitored on TID basis and has been stable.  Peripheral as well as resting hand splint was ordered for support due to dense right hemiplegia.    He was reported to have significant left gaze preference on 09/05 and repeat CT of head showed interval evolution of left MCA infarct without new or subacute findings.  He was also noted to be febrile with temp at 100.4 with leukocytosis and ID was consulted for input on initiation of antibiotics.  Antibiotics held as this was felt to be due to aspiration or chemical pneumonitis and patient was asymptomatic with negative CXR.  His respiratory status has been stable and reactive leukocytosis has resolved.  He started developing CVA related pain with developing tone right fingers. [facility] ordered for support with Voltaren gel for pain relief.  Edema right hand has decreased and family was advised to use elevation as well as brace after discharge.  His p.o. intake  has been good and mood has been stable. He has made slow, steady gains but continues to be limited by aphasia as well as right hemiplegia. He will continue to receive follow up HHPT, HHOT,  HHST and HHRN by [facility] after discharge.   Rehab course: During patient's stay in rehab weekly team conferences were held to monitor patient's progress, set goals and discuss barriers to discharge. At admission, patient required mod assist with mobility and total assist with basic self care tasks.  He exhibited severe expressive and moderate receptive aphasia with communication by head nods, was able to follow Y/N questions with 61% accuracy as well as question of oral apraxia.  He  has had improvement in activity tolerance, balance, postural control as well as ability to compensate for deficits. He has had improvement in functional use RUE  and RLE as well as improvement in awareness.   He requires mod assist with self care tasks and min assist with transfers.  He requires min assist with max cues for sit to stand transfers.  He is able to ambulate 62' with use of HW and  min assist.He  continues to be limited by severity of aphasia and requires mod assist with multimodal cues to express basic wants/needs. Diet has been advanced to D2, honey liquids with mod assist/cues for compensatory strategies. Family education has been completed.    Disposition: Home  Diet: D2, honey liquids. Monitor for pocketing.    Special Instructions: Stop Plavix 05/10/22 and continue ASA daily or as advised by neurology.  Family to assist patient with meals. Full supervision for safety. Meds crushed with puree.  Repeat BMET/CBC in 7-10 days to monitor H/H and renal status.   Discharge Instructions     Ambulatory referral to Neurology   Complete by: As directed    An appointment is requested in approximately: 2-4 weeks   Ambulatory referral to Physical Medicine Rehab   Complete by: As directed        Allergies as of 03/13/2022       Reactions   Amlodipine Swelling   Swelling of lips.   Lisinopril Other (See Comments)   Elevated BP higher & causes a burning feeling on the inside.          Medication List     STOP taking these medications    cetirizine 10 MG tablet Commonly known as: ZYRTEC   HYDROcodone-acetaminophen 7.5-325 MG tablet Commonly known as: Norco   metoprolol succinate 50 MG 24 hr tablet Commonly known as: Toprol XL   pantoprazole 40 MG tablet Commonly known as: Protonix   senna-docusate 8.6-50 MG tablet Commonly known as: Senokot-S   tamsulosin 0.4 MG Caps capsule Commonly known as: FLOMAX   Turmeric 1053 MG Tabs       TAKE these medications    acetaminophen 325 MG tablet Commonly known as: TYLENOL Take 1-2 tablets (325-650 mg total) by mouth every 4 (four) hours as needed for mild pain. What changed:  medication strength how much to take when to take this reasons to take this   albuterol 108 (90 Base) MCG/ACT inhaler Commonly known as: VENTOLIN HFA Inhale 2 puffs into the lungs every 6 (six) hours as needed for shortness of breath or wheezing.   Aspirin Low Dose 81 MG tablet Generic drug: aspirin EC Take 1 tablet (81 mg total) by mouth daily with breakfast. What changed: additional instructions   atorvastatin 10 MG tablet Commonly known as: LIPITOR Take 1 tablet (10 mg total)  by mouth daily.   brimonidine 0.2 % ophthalmic solution Commonly known as: ALPHAGAN Place 1 drop into both eyes 3 (three) times daily.   clopidogrel 75 MG tablet Commonly known as: PLAVIX Take 1 tablet (75 mg total) by mouth daily with breakfast. What changed: additional instructions   dorzolamide-timolol 22.3-6.8 MG/ML ophthalmic solution Commonly known as: COSOPT Place 1 drop into both eyes 2 (two) times daily.   famotidine 20 MG tablet Commonly known as: PEPCID Take 1 tablet (20 mg total) by mouth 2 (two) times daily.   finasteride 5 MG tablet Commonly known as: PROSCAR Take 1 tablet (5 mg total) by mouth daily.   latanoprost 0.005 % ophthalmic solution Commonly known as: XALATAN Place 1 drop into both eyes every evening.    metoprolol tartrate 25 MG tablet Commonly known as: LOPRESSOR Take 1/2 tablet (12.5 mg total) by mouth 2 (two) times daily.   mouth rinse Liqd solution 15 mLs by Mouth Rinse route 4 (four) times daily - after meals and at bedtime.   MULTIVITAMIN PO Take 1 tablet by mouth daily.   SimplyThick Easy Mix Gel Generic drug: Xanthan Gum Take 1 packet (15 g total) by mouth as needed.        Follow-up Information     Rakes, Doralee Albino, FNP Follow up.   Specialty: Family Medicine Why: Call in 1-2 days for post hospital follow up Contact information: [address] [place] [address] [phone]         Erick Colace, MD Follow up.   Specialty: Physical Medicine and Rehabilitation Why: office will call you with follow up appointment Contact information: [address] [place] [address] [phone]                 Signed: Jacquelynn Cree 03/16/2022, 4:38 PM

## 2022-03-11 NOTE — Plan of Care (Signed)
  Problem: RH Toileting Goal: LTG Patient will perform toileting task (3/3 steps) with assistance level (OT) Description: LTG: Patient will perform toileting task (3/3 steps) with assistance level (OT)  Flowsheets (Taken 03/11/2022 0829) LTG: Pt will perform toileting task (3/3 steps) with assistance level: (LTG downgraded. Pt will tolerate standing for 3 min to enable caregiver time to cleanse pt and manage his clothing pre and post toileting.) Total Assistance - Patient < 25% Note: LTG downgraded. Pt will tolerate standing for 3 min to enable caregiver time to cleanse pt and manage his clothing pre and post toileting.   Problem: RH Functional Use of Upper Extremity Goal: LTG Patient will use RT/LT upper extremity as a (OT) Description: LTG: Patient will use right/left upper extremity as a stabilizer/gross assist/diminished/nondominant/dominant level with assist, with/without cues during functional activity (OT) Flowsheets (Taken 03/11/2022 0829) LTG: Pt will use upper extremity in functional activity with assistance level of: (goal downgraded as pt has limited attention to RUE) Moderate Assistance - Patient 50 - 74% Note: goal downgraded as pt has limited attention to RUE

## 2022-03-11 NOTE — Progress Notes (Signed)
Occupational Therapy Session Note  Patient Details  Name: Paul Bradshaw MRN: [id number] Date of Birth: [birth year]  Today's Date: 03/11/2022 OT Individual Time: 9798-9211 OT Individual Time Calculation (min): 53 min    Short Term Goals: Week 3:  OT Short Term Goal 1 (Week 3): STGs = LTGs  Skilled Therapeutic Interventions/Progress Updates:  Pt awake in w/c with family present upon OT arrival to the room. Pt's DIL reports, "Oh, I went through this since I had a stroke before." Pt in agreement for OT session.  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  Pain: Pain Assessment Pain Scale: 0-10 Pain Score: 0-No   ADL: Toileting: Dependent (Pt requires total assistance to complete 3/3 toileting tasks after having a BM. Pt able to maintain standing balance with LUE on arm rest of w/c while OT provides assistance for toileting tasks.) Where Assessed-Toileting: Bedside Commode (BSC placed over toilet) Toilet Transfer: Minimal assistance Toilet Transfer Method: Stand pivot Toilet Transfer Equipment: Bedside commode, Grab bars (BSC placed over toilet) ADL Comments: Pt able to perform SPT form w/c <> BSC placed over toilet with minimal assistance - CGA (pt requires increased assist transferring to [facility] due to performing SPT to the R). Pt then able to perform SPT to the R from w/c > EOB with use of hemi-walker with CGA. Pt able to perform sit > supine transfer with minimal assistance to guide trunk to supine position.  Family Training: Pt's son and DIL present for family training. OT provides education and provides picture/handout that pt's primary therapist created for information regarding toilet transfers/shower transfers and instructions for after DC. OT provides verbal  education and skilled demo on techniques to position w/c and provide adequate assistance for toileting and toilet transfer within the home environment. OT provides education and simulation for techniques within the home environment in the bathroom in pt's room. Pt able to perform SPT from w/c <> BSC placed over toilet with minimal assistance and requires total assistance for toileting hygiene. However, pt able to maintain standing balance using LUE on arm rest of w/c while OT provides assistance for toileting tasks. Pt's DIL able to provide adequate CGA for pt's SPT from [facility] > w/c with DIL provided appropriate VC's to pt to improve safety. OT provides education to pt's family on wearing schedule for splints and provides demo on techniques to doff and don splints. Pt's family reports no other concerns at this time.   Pt returned to bed at end of session. Pt left resting comfortably in bed with personal belongings and call light within reach, bed alarm on and activated, bed in low position, 3 bed rails up, support RUE on pillow, and comfort needs attended to.   Therapy/Group: Individual Therapy  [name] 03/11/2022, 4:48 PM

## 2022-03-11 NOTE — Progress Notes (Signed)
Physical Therapy Session Note  Patient Details  Name: Paul Bradshaw MRN: [id number] Date of Birth: 09-03-1925  Today's Date: 03/11/2022 PT Individual Time: 1301-1401 PT Individual Time Calculation (min): 60 min   Short Term Goals: Week 1:  PT Short Term Goal 1 (Week 1): Pt will perform bed mobility with overall CGA and using no bed features. PT Short Term Goal 1 - Progress (Week 1): Progressing toward goal PT Short Term Goal 2 (Week 1): Pt will perform all functional transfers with light MinA/ CGA and LRAD. PT Short Term Goal 2 - Progress (Week 1): Progressing toward goal PT Short Term Goal 3 (Week 1): Pt will ambulate at least 40 ft using LRAD with CGA/ MinA. PT Short Term Goal 3 - Progress (Week 1): Progressing toward goal PT Short Term Goal 4 (Week 1): Pt will initiate stair training. PT Short Term Goal 4 - Progress (Week 1): Progressing toward goal PT Short Term Goal 5 (Week 1): Pt will perform Berg Balance test. PT Short Term Goal 5 - Progress (Week 1): Progressing toward goal Week 2:  PT Short Term Goal 1 (Week 2): Pt will perform bed mobility with overall CGA and using no bed features. PT Short Term Goal 1 - Progress (Week 2): Partly met PT Short Term Goal 2 (Week 2): Pt will perform all functional transfers with light MinA/ CGA and LRAD. PT Short Term Goal 2 - Progress (Week 2): Partly met PT Short Term Goal 3 (Week 2): Pt will ambulate at least 40 ft using LRAD with CGA/ MinA. PT Short Term Goal 3 - Progress (Week 2): Partly met PT Short Term Goal 4 (Week 2): Pt will initiate stair training. PT Short Term Goal 4 - Progress (Week 2): Progressing toward goal Week 3:  PT Short Term Goal 1 (Week 3): STG = LTG d/t ELOS  Skilled Therapeutic Interventions/Progress Updates:  Patient seated upright in w/c on entrance to room. Patient alert and agreeable to PT session. Son and girlfriend present for family education.   Patient with no pain complaint at start of session. Wrist cockup  splint applied to RUE.   Therapeutic Activity: Transfers: Pt performed sit<>stand and stand pivot transfers throughout session using HW with CGA. Provided instruction on positioning and provision of CGA for safety with verbal cues for son's girlfriend to perform. She is able to return demo safe CGA throughout session. Son is able to participate in one instance of stand pivot transfer prior to need to sit to catch breath with O2 use. Discussion with son re: need to manage O2 line at home outside of pt's transfer space.   Stand pivot transfer improved with use of HW. Son's girlfriend provides good cueing for sequencing steps and maintaining turn until back of legs reach seated surface. Instructed son to remove w/c from transfer space if in way of gf providing appropriate CGA for pt. Related that pt sometimes requires MinA and related hand position at very low back if not under pt's seat in order to assist with boost. GF very attentive to positioning prior to sit.   Gait Training:  Pt ambulated short distances in room and in therapy gym using [place] with [place]. Son and girlfriend instructed to transport pt throughout home using w/c until HHPT clears pt to ambulate with them. Demonstrated surprisingly good use of HW despite only being introduced to pt yesterday. Provided vc/ tc for HW positioning and sequencing of steps.  Discussion of need to continue family education next day with  focus on bed mobility and car transfer.   Son's girlfriend relates car unexpectedly in shop and won't be ready until sometime on Thu.   Patient seated upright in w/c at end of session with brakes locked, no alarm set as ST on way to start family education, and all needs within reach.   Therapy Documentation Precautions:  Precautions Precautions: Fall Precaution Comments: R hemipareisis, expressive>receptive aphasia Restrictions Weight Bearing Restrictions: No General:   Vital Signs:   Pain: Pain Assessment Pain Scale:  0-10 Pain Score: 0-No pain  Therapy/Group: Individual Therapy  Alger Simons PT, DPT, CSRS 03/11/2022, 5:47 PM

## 2022-03-11 NOTE — Progress Notes (Signed)
Speech Language Pathology Daily Session Note  Patient Details  Name: Paul Bradshaw MRN: [id number] Date of Birth: 1926/05/27  Today's Date: 03/11/2022 SLP Individual Time: 0805-0900 SLP Individual Time Calculation (min): 55 min  Short Term Goals: Week 3: SLP Short Term Goal 1 (Week 3): STG=LTG due to ELOS  Skilled Therapeutic Interventions: Skilled ST treatment focused on dysphagia and language goals. Pt received semi reclined in bed on arrival and greeted SLP with a subtle wave. SLP elevated HOB to optimal positioning for consumption of dysphagia 2 trial tray. Pt initiated self feeding and persisted to task with overall min A for scooping food onto spoon and alternating between various food choices, which is significant improvement as compared to max feeding assist required last week with minimal-to-no observed initiation. Pt followed simple commands throughout meal with mod A verbal/visual cues. Pt responded to yes/no questions re: preferences (syrup on waffle, salt and pepper on eggs & grits) with min A for clarification. There were no vocalization attempts made.    Pt consumed dysphagia 2 textures with prolonged yet more effective mastication, mild-to-moderate buccal stasis, mild anterior labial spillage on right, and without pharyngeal symptoms concerning for airway invasion. Pt was able to clear right buccal stasis with cued lingual sweep and alternating between bites and cup sips of honey thick liquids. Following initial cues, pt was observed to spend additional time clearing buccal cavity between bites requiring overall mod A verbal cues to ensure this was being implemented, in addition to slow rate, and small bite sizes. Pt did not demonstrate any signs of fatigue during meal and was able to consume 50% of meal in an appropriate amount of time given additional time needed for thorough mastication and oral clearance.  SLP recommends diet advancement to dysphagia 2 textures with honey thick  liquids. Continue full supervision with ongoing cues to assess R buccal cavity for pocketing, clearance of anterior spillage, slow rate, small bites, and upright positioning. Continue crushed meds. Will communicate diet advancement, as well as ongoing education with family scheduled this afternoon. Patient was left in bed with alarm activated and immediate needs within reach at end of session. Continue per current plan of care.    Pain No pain behaviors observed during session.   Therapy/Group: Individual Therapy  Patty Sermons 03/11/2022, 8:33 AM

## 2022-03-11 NOTE — Progress Notes (Signed)
Speech Language Pathology Daily Session Note  Patient Details  Name: Marley L Holaday MRN: [id number] Date of Birth: 07/15/1925  Today's Date: 03/11/2022 SLP Individual Time: 1407-1500 SLP Individual Time Calculation (min): 53 min  Short Term Goals: Week 3: SLP Short Term Goal 1 (Week 3): STG=LTG due to ELOS  Skilled Therapeutic Interventions: Skilled ST treatment focused on education with pt's care partners. SLP facilitated verbal and written education re: aphasia and apraxia, communication strategies, dysphagia, diet recommendations, swallowing precautions and strategies, thickened liquids, ordering information for thickener, suction kit instructions + demonstration on use, and oral care guidelines. Family returned understanding through teach back. Handouts were provided to family to take home. Also provided with honey thick packets and suction kit items (suction tubing, yaunkauer, suction sponges, suction toothbrushes). Patient was left in wheelchair with alarm activated and immediate needs within reach at end of session. Continue per current plan of care.      Pain  No pain behaviors demonstrated  Therapy/Group: Individual Therapy  Brianne T Garretson 03/11/2022, 3:40 PM 

## 2022-03-11 NOTE — Progress Notes (Signed)
Patient ID: Paul Bradshaw, male   DOB: 20-Jan-1926, 88 y.o.   MRN: [id number]  [facility] referral sent to [facility]

## 2022-03-11 NOTE — Progress Notes (Signed)
Physical Therapy Session Note  Patient Details  Name: Paul Bradshaw MRN: [id number] Date of Birth: [birth year]  Today's Date: 03/11/2022 PT Individual Time: 1030-1100 PT Individual Time Calculation (min): 30 min   Short Term Goals: Week 3:  PT Short Term Goal 1 (Week 3): STG = LTG d/t ELOS  Skilled Therapeutic Interventions/Progress Updates:     Patient in bed asleep upon PT arrival. Patient slow to arouse and max stimulation to arouse and agreeable to PT session. Patient denied pain during session.  Placed patient's hearing aids in and patient indicated, using yes/no questions, that he slept poorly overnight and confirmed by LPN.   Therapeutic Activity: Bed Mobility: Patient performed supine to/from sit with min A-CGA for trunk control in a flat bed without use of bed rails. Provided verbal cues for initiation, use of upper extremities, and management of R upper extremity. Transfers: Patient performed sit to/from stand x1 and stand pivot bed<>w/c with mod-max A with R lean and decreased R gluteal/quad activation in standing. Provided verbal cues for initiation, hand placement, forward weight shift, R hemi-body elongation, and completion of turn prior to sitting. Facilitated R knee/foot management throughout.  Patient required increased time for initiation, cuing, communication due to expressive aphasia, rest breaks, and for completion of tasks throughout session. Utilized therapeutic use of self throughout to promote efficiency.   Patient in bed per patient request due to fatigue at end of session with breaks locked, bed alarm set, and all needs within reach.   Therapy Documentation Precautions:  Precautions Precautions: Fall Precaution Comments: R hemipareisis, expressive>receptive aphasia Restrictions Weight Bearing Restrictions: No    Therapy/Group: Individual Therapy  Ellington Greenslade L Justyce Yeater PT, DPT, NCS, CBIS  03/11/2022, 12:39 PM

## 2022-03-11 NOTE — Progress Notes (Signed)
PROGRESS NOTE   Subjective/Complaints: Aphasic working with SLP, D2 trial going ok thus far   ROS: Limited due to language/communication   Objective:   No results found. Recent Labs    03/10/22 0506  WBC 7.5  HGB 10.7*  HCT 32.8*  PLT 237      Recent Labs    03/10/22 0506  NA 135  K 3.7  CL 100  CO2 27  GLUCOSE 104*  BUN 24*  CREATININE 0.87  CALCIUM 8.6*      Intake/Output Summary (Last 24 hours) at 03/11/2022 0815 Last data filed at 03/10/2022 1904 Gross per 24 hour  Intake 240 ml  Output --  Net 240 ml      Pressure Injury 02/07/22 Coccyx Medial Stage 1 -  Intact skin with non-blanchable redness of a localized area usually over a bony prominence. (Active)  02/07/22 1327  Location: Coccyx  Location Orientation: Medial  Staging: Stage 1 -  Intact skin with non-blanchable redness of a localized area usually over a bony prominence.  Wound Description (Comments):   Present on Admission: Yes    Physical Exam: Vital Signs Blood pressure 130/77, pulse 76, temperature 98.5 F (36.9 C), temperature source Oral, resp. rate 18, height 5\' 5"  (1.651 m), weight 64.5 kg, SpO2 100 %.   General: No acute distress Psych: Mood and affect are appropriate Heart: Regular rate and rhythm no rubs murmurs or extra sounds Lungs: Clear to auscultation, breathing unlabored, no rales or wheezes Abdomen: Positive bowel sounds, soft nontender to palpation, nondistended Extremities: Nonpitting edema R hand, wrist. MSK no evidence of right wrist or finger erythema, ecchymosis, pain with passive wrist ext   Skin: No evidence of breakdown, no evidence of rash. No erythema or warmth.  Neurologic: Awake, alert, does not respond to orientation quesitons. Does nod Y/N.  No hypersensitivity to touch RIght hand 0/5 motors RUE  4/5 RLE    Assessment/Plan: 1. Functional deficits which require 3+ hours per day of  interdisciplinary therapy in a comprehensive inpatient rehab setting. Physiatrist is providing close team supervision and 24 hour management of active medical problems listed below. Physiatrist and rehab team continue to assess barriers to discharge/monitor patient progress toward functional and medical goals  Care Tool:  Bathing    Body parts bathed by patient: Chest, Face, Abdomen, Right upper leg, Left upper leg   Body parts bathed by helper: Right arm, Left arm, Buttocks, Front perineal area, Right lower leg, Left lower leg     Bathing assist Assist Level: Moderate Assistance - Patient 50 - 74%     Upper Body Dressing/Undressing Upper body dressing   What is the patient wearing?: Pull over shirt    Upper body assist Assist Level: Moderate Assistance - Patient 50 - 74%    Lower Body Dressing/Undressing Lower body dressing      What is the patient wearing?: Incontinence brief, Pants     Lower body assist Assist for lower body dressing: Maximal Assistance - Patient 25 - 49%     Toileting Toileting    Toileting assist Assist for toileting: Total Assistance - Patient < 25%     Transfers Chair/bed transfer  Transfers  assist  Chair/bed transfer activity did not occur: Safety/medical concerns  Chair/bed transfer assist level: Minimal Assistance - Patient > 75%     Locomotion Ambulation   Ambulation assist   Ambulation activity did not occur: Safety/medical concerns          Walk 10 feet activity   Assist  Walk 10 feet activity did not occur: Safety/medical concerns        Walk 50 feet activity   Assist Walk 50 feet with 2 turns activity did not occur: Safety/medical concerns         Walk 150 feet activity   Assist Walk 150 feet activity did not occur: Safety/medical concerns         Walk 10 feet on uneven surface  activity   Assist Walk 10 feet on uneven surfaces activity did not occur: Safety/medical concerns          Wheelchair     Assist Is the patient using a wheelchair?: Yes (Did not use one PTA) Type of Wheelchair: Manual Wheelchair activity did not occur: Safety/medical concerns         Wheelchair 50 feet with 2 turns activity    Assist    Wheelchair 50 feet with 2 turns activity did not occur: Safety/medical concerns       Wheelchair 150 feet activity     Assist  Wheelchair 150 feet activity did not occur: Safety/medical concerns       Blood pressure 130/77, pulse 76, temperature 98.5 F (36.9 C), temperature source Oral, resp. rate 18, height 5\' 5"  (1.651 m), weight 64.5 kg, SpO2 100 %.  Medical Problem List and Plan: 1. 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  Antithrombotics: -DVT/anticoagulation:  Pharmaceutical: Lovenox             -antiplatelet therapy: DAPT X 3 months followed by ASA alone.  3. Pain: continue Tylenol prn.  CVA related pain developing tone in RIght finger an wrist flexors, cont ROM, premedicate with tylenol discussed with RN and OT- trial Voltaren gel               - R wrist xray ordered; NWB and in resting splint until fracture can be ruled out 9/15              - 9/16 - Xray with arthritis, worse at first [facility] joint. No Fx. Placed order for cock up splint.             Has pain with ROM RUE cont voltaren gel, wrist splint at noc, prn wrist splint during the day  4. Mood/Behavior/Sleep: LCSW to follow for evaluation and support.              -antipsychotic agents: N/A 5. Neuropsych/cognition: This patient is not fully capable of making decisions on his own behalf due to aphasia. 6. Skin/Wound Care: Routine pressure relief  measures.  7. Fluids/Electrolytes/Nutrition: Monitor I/O- oral intake ok  HypoK+ resolved on supplements     Latest Ref Rng & Units 03/10/2022    5:06 AM 03/03/2022    8:00 AM 02/25/2022    6:38 AM  BMP  Glucose 70 - 99 mg/dL 696  295  284   BUN 8 - 23 mg/dL 24  25  24    Creatinine 0.61 - 1.24 mg/dL  1.32  4.40   Sodium 135 - 145 mmol/L 135  134  134   Potassium 3.5 - 5.1 mmol/L 3.7  3.8  4.2   Chloride 98 - 111 mmol/L 100  100  97   CO2 22 - 32 mmol/L 27  27  26    Calcium 8.9 - 10.3 mg/dL 8.6  9.0  8.5     HypoK+ will supplement , not on diuretics but had several stools on 8/27  9/2- last K+ 4.2- doing well 8. L-MCA infarct with hemorrhagic conversion: DAPT X 90 day followed by ASA alone 9. HTN: Monitor BP TID. BP remains labile. Continue Proscar.  --Continue lopressor to 12.5mg  BID given hypotension  Vitals:   03/10/22 1929 03/11/22 0428  BP: (!) 106/50 130/77  Pulse: 84 76  Resp: 16 18  Temp: 98.8 F (37.1 C) 98.5 F (36.9 C)  SpO2: 94% 100%   Controlled 9/19  10. Dysphagia: Continue D1, honey thick liquids. Needs assistance for feeding and supervision for safety.  --hypernatremia/AKI likely due to dysphagia diet 11. BPH: Monitor for any voiding difficulties. Will order PVR checks as off his meds.  --Proscar was not resumed. Flomax was d/c on 08/20.   13. Covid + asymptomatic,finished 10d in room quarantine on 9/8    Latest Ref Rng & Units 03/10/2022    5:06 AM 03/03/2022    8:00 AM 02/27/2022    5:35 AM  CBC  WBC 4.0 - 10.5 K/uL 7.5  7.3  9.4   Hemoglobin 13.0 - 17.0 g/dL 05/03/2022  04/29/2022  72.5   Hematocrit 39.0 - 52.0 % 32.8  35.1  32.4   Platelets 150 - 400 K/uL 237  395  345   Leukocytosis resolved   14. Apneic episodes on continuous pulse ox while sleeping, does recover on own, consulted RT - aphasic cannot ask about am HA or other symptoms    LOS: 22 days A FACE TO FACE EVALUATION WAS PERFORMED  36.6 03/11/2022, 8:15 AM

## 2022-03-11 NOTE — Plan of Care (Signed)
Patient remains incontinent of bowel and bladder post stroke despite toileting protocol activation

## 2022-03-11 NOTE — Progress Notes (Signed)
Speech Language Pathology Discharge Summary  Patient Details  Name: Paul Bradshaw MRN: [id number] Date of Birth: [birth year]  Date of Discharge from SLP service:March 12, 2022  {chl ip rehab slp time calculations:304100500}  Skilled Therapeutic Interventions:  ***   Patient has met 4 of 5 long term goals.  Patient to discharge at [facility] Max;Mod level.  Reasons goals not met: Severity of deficits   Clinical Impression/Discharge Summary: Pt has demonstrated slow yet functional progress towards swallowing and communication, meeting 4 out of 5 long-term goals this admission. Patient is currently comprehending basic auditory information with moderate assist, and communicating functional needs through multimodal means with max A. Pt continues to be limited by severity of deficits. Pt demonstrates mild progress with oropharyngeal swallow function and is currently consuming a dysphagia 2 diet with honey thick liquids, crushed medications, and full supervision with ongoing (moderate A) cues to assess R buccal cavity for pocketing, clearance of anterior spillage, slow rate, small bites, and upright positioning. Pt/family education completed. Care partners are independent to provide the necessary physical and cognitive-communicative assistance at discharge. Pt will require 24 hour care and is recommended for ongoing ST intervention at next level of care in home health setting  to maximize communication and swallow function and functional independence.   Care Partner:  Caregiver Able to Provide Assistance: Yes  Type of Caregiver Assistance: Cognitive;Physical  Recommendation:  24 hour supervision/assistance;Home Health SLP  Rationale for SLP Follow Up: Maximize functional communication;Maximize swallowing safety;Reduce caregiver burden   Equipment: Thickener packets provided   Reasons for discharge: Discharged from hospital   Patient/Family Agrees with Progress Made and Goals Achieved: Yes     Patty Sermons 03/11/2022, 12:55 PM

## 2022-03-12 ENCOUNTER — Other Ambulatory Visit (HOSPITAL_COMMUNITY): Payer: Self-pay

## 2022-03-12 DIAGNOSIS — I69320 Aphasia following cerebral infarction: Secondary | ICD-10-CM

## 2022-03-12 DIAGNOSIS — I69391 Dysphagia following cerebral infarction: Secondary | ICD-10-CM

## 2022-03-12 MED ORDER — XANTHAN GUM PO GEL
1.0000 | ORAL | 1 refills | Status: DC | PRN
  Filled 2022-03-12: qty 100, 30d supply, fill #0
  Filled 2022-03-12: qty 100, fill #0

## 2022-03-12 MED ORDER — FINASTERIDE 5 MG PO TABS
5.0000 mg | ORAL_TABLET | Freq: Every day | ORAL | 0 refills | Status: DC
  Filled 2022-03-12: qty 30, 30d supply, fill #0

## 2022-03-12 MED ORDER — ACETAMINOPHEN 325 MG PO TABS
325.0000 mg | ORAL_TABLET | ORAL | 0 refills | Status: DC | PRN
  Filled 2022-03-12: qty 100, 9d supply, fill #0

## 2022-03-12 MED ORDER — ATORVASTATIN CALCIUM 10 MG PO TABS
10.0000 mg | ORAL_TABLET | Freq: Every day | ORAL | 0 refills | Status: DC
  Filled 2022-03-12: qty 30, 30d supply, fill #0

## 2022-03-12 MED ORDER — METOPROLOL TARTRATE 25 MG PO TABS
12.5000 mg | ORAL_TABLET | Freq: Two times a day (BID) | ORAL | 0 refills | Status: DC
  Filled 2022-03-12: qty 30, 30d supply, fill #0

## 2022-03-12 MED ORDER — FAMOTIDINE 20 MG PO TABS
20.0000 mg | ORAL_TABLET | Freq: Two times a day (BID) | ORAL | 0 refills | Status: DC
  Filled 2022-03-12: qty 60, 30d supply, fill #0

## 2022-03-12 MED ORDER — CLOPIDOGREL BISULFATE 75 MG PO TABS
75.0000 mg | ORAL_TABLET | Freq: Every day | ORAL | 0 refills | Status: DC
  Filled 2022-03-12: qty 60, 60d supply, fill #0

## 2022-03-12 MED ORDER — ORAL CARE MOUTH RINSE: 15.0000 mL | Freq: Three times a day (TID) | OROMUCOSAL | 0 refills | Status: DC

## 2022-03-12 MED ORDER — PANTOPRAZOLE SODIUM 40 MG PO PACK
40.0000 mg | PACK | Freq: Every day | ORAL | 0 refills | Status: DC
  Filled 2022-03-12: qty 30, 30d supply, fill #0

## 2022-03-12 MED ORDER — ASPIRIN 81 MG PO TBEC
81.0000 mg | DELAYED_RELEASE_TABLET | Freq: Every day | ORAL | 12 refills | Status: DC
  Filled 2022-03-12: qty 30, 30d supply, fill #0

## 2022-03-12 MED ORDER — FAMOTIDINE 20 MG PO TABS
20.0000 mg | ORAL_TABLET | Freq: Two times a day (BID) | ORAL | Status: DC
  Administered 2022-03-12 – 2022-03-13 (×2): 20 mg via ORAL
  Filled 2022-03-12 (×2): qty 1

## 2022-03-12 NOTE — Progress Notes (Signed)
Occupational Therapy Session Note  Patient Details  Name: Paul Bradshaw MRN: [id number] Date of Birth: 09-03-1925  Today's Date: 03/12/2022 OT Individual Time: 0900-1000 OT Individual Time Calculation (min): 60 min    Short Term Goals: Week 3:  OT Short Term Goal 1 (Week 3): STGs = LTGs  Skilled Therapeutic Interventions/Progress Updates:    Pt received in bed agreeable to OT. Pt practiced getting out of bed to his R side with min A to fully roll and push up.  Used hemiwalker to stand pivot to w/c with min A.   Pt taken to toilet, his brief was soaked with urine but once sitting on toilet he had a bowel movement.   Total A with cleansing, but pt able to hold his balance with CGA while standing and holding onto hemiwalker with L hand.  Used gait belt as a sling for R arm to prevent it from hanging.  Pt transferred back to wc and then to tub bench.  In shower, bathed with mod A then transferred to chair to dress. He continues to need significant A with dressing due to apraxia, difficulty managing arm,  decreased initiation with trying to do it himself.  Mod -max cues today for all mobility with frequent cues to keep feet under knees prior to standing as he pushes feet forward.  Cues for forward lean and then to find his midline.  Family will be coming back in this afternoon to practice assisting him with mobility more.  Pt resting in w/c with belt alarm on.  Call light in reach.   Therapy Documentation Precautions:  Precautions Precautions: Fall Precaution Comments: R hemipareisis, expressive>receptive aphasia Restrictions Weight Bearing Restrictions: No   Pain:  No c/o pain as his wrist was well supported in wrist splint ADL: ADL Eating: Moderate assistance Grooming: Moderate cueing, Supervision/safety Where Assessed-Grooming: Edge of bed Upper Body Bathing: Minimal assistance Where Assessed-Upper Body Bathing: Shower Lower Body Bathing: Moderate assistance, Moderate cueing (Pt able to  bathe 2.5/5 body parts while seated on tub-bench in the shower and standing with minimal assist with use of grab bars while therapist provides assist to bathe buttocks. Pt able to bathe B thighs & front peri-area with mod VCs for sequencing) Where Assessed-Lower Body Bathing: Shower Upper Body Dressing: Moderate assistance (mod cues) Where Assessed-Upper Body Dressing: Wheelchair (w/c) Lower Body Dressing:  (pt holds onto hemi walker or w.c arm rest for support) Where Assessed-Lower Body Dressing: Wheelchair (w/c) Toileting: Dependent (Pt requires total assistance to complete 3/3 toileting tasks after having a BM. Pt able to maintain standing balance with LUE on arm rest of w/c while OT provides assistance for toileting tasks.) Where Assessed-Toileting: Bedside Commode (BSC placed over toilet) Toilet Transfer: Minimal assistance Toilet Transfer Method: Stand pivot Toilet Transfer Equipment: Bedside commode, Grab bars (BSC placed over toilet) Gaffer Transfer: Minimal assistance Social research officer, government Method: Radiographer, therapeutic: Radio broadcast assistant, Grab bars ADL Comments: .   Therapy/Group: Individual Therapy  Anjannette Gauger 03/12/2022, 10:14 AM

## 2022-03-12 NOTE — Plan of Care (Signed)
  Problem: RH Expression Communication Goal: LTG Patient will express needs/wants via multi-modal(SLP) Description: LTG:  Patient will express needs/wants via multi-modal communication (gestures/written, etc) with cues (SLP) Outcome: Not Met (add Reason)   Problem: RH Swallowing Goal: LTG Patient will consume least restrictive diet using compensatory strategies with assistance (SLP) Description: LTG:  Patient will consume least restrictive diet using compensatory strategies with assistance (SLP) Outcome: Completed/Met Goal: LTG Patient will participate in dysphagia therapy to increase swallow function with assistance (SLP) Description: LTG:  Patient will participate in dysphagia therapy to increase swallow function with assistance (SLP) Outcome: Completed/Met Goal: LTG Pt will demonstrate functional change in swallow as evidenced by bedside/clinical objective assessment (SLP) Description: LTG: Patient will demonstrate functional change in swallow as evidenced by bedside/clinical objective assessment (SLP) Outcome: Completed/Met   Problem: RH Comprehension Communication Goal: LTG Patient will comprehend basic/complex auditory (SLP) Description: LTG: Patient will comprehend basic/complex auditory information with cues (SLP). Outcome: Completed/Met

## 2022-03-12 NOTE — Progress Notes (Signed)
Inpatient Rehabilitation Care Coordinator Discharge Note   Patient Details  Name: Paul Bradshaw MRN: [id number] Date of Birth: August 14, 1925   Discharge location: Home  Length of Stay: 24 Days  Discharge activity level: Mod/Max  Home/community participation: Son and friend  Patient response OI:ZTIWPY Literacy - How often do you need to have someone help you when you read instructions, pamphlets, or other written material from your doctor or pharmacy?: Patient unable to respond  Patient response KD:XIPJAS Isolation - How often do you feel lonely or isolated from those around you?: Patient unable to respond  Services provided included: MD, RD, PT, OT, SLP, CM, RN, Pharmacy, TR, SW  Financial Services:  Charity fundraiser Utilized: [place] offered to/list presented to: Patient son  Follow-up services arranged:  [place]: [place]         Patient response to transportation need: Is the patient able to respond to transportation needs?: Yes In the past 12 months, has lack of transportation kept you from medical appointments or from getting medications?: No In the past 12 months, has lack of transportation kept you from meetings, work, or from getting things needed for daily living?: No    Comments (or additional information):  Patient/Family verbalized understanding of follow-up arrangements:  Yes  Individual responsible for coordination of the follow-up plan: Legrand Como [phone]  Confirmed correct DME delivered: Dyanne Iha 03/12/2022    Dyanne Iha

## 2022-03-12 NOTE — Plan of Care (Signed)
  Problem: RH Balance Goal: LTG Patient will maintain dynamic standing balance (PT) Description: LTG:  Patient will maintain dynamic standing balance with assistance during mobility activities (PT) Outcome: Completed/Met Flowsheets (Taken 03/05/2022 0938 by Lorie Phenix, PT) LTG: Pt will maintain dynamic standing balance during mobility activities with:: Minimal Assistance - Patient > 75%   Problem: Sit to Stand Goal: LTG:  Patient will perform sit to stand with assistance level (PT) Description: LTG:  Patient will perform sit to stand with assistance level (PT) Outcome: Completed/Met Flowsheets (Taken 03/05/2022 0938 by Lorie Phenix, PT) LTG: PT will perform sit to stand in preparation for functional mobility with assistance level: Contact Guard/Touching assist Note: Can perform up to supervision level.   Problem: RH Bed Mobility Goal: LTG Patient will perform bed mobility with assist (PT) Description: LTG: Patient will perform bed mobility with assistance, with/without cues (PT). Outcome: Completed/Met Flowsheets (Taken 03/12/2022 1729) LTG: Pt will perform bed mobility with assistance level of: Supervision/Verbal cueing   Problem: RH Bed to Chair Transfers Goal: LTG Patient will perform bed/chair transfers w/assist (PT) Description: LTG: Patient will perform bed to chair transfers with assistance (PT). Outcome: Completed/Met Flowsheets (Taken 03/12/2022 1729) LTG: Pt will perform Bed to Chair Transfers with assistance level: Contact Guard/Touching assist   Problem: RH Car Transfers Goal: LTG Patient will perform car transfers with assist (PT) Description: LTG: Patient will perform car transfers with assistance (PT). Outcome: Completed/Met Flowsheets (Taken 03/12/2022 1729) LTG: Pt will perform car transfers with assist:: Contact Guard/Touching assist Note: Increased cues for pivot to sit with ot without use of HW   Problem: RH Furniture Transfers Goal: LTG Patient will  perform furniture transfers w/assist (OT/PT) Description: LTG: Patient will perform furniture transfers  with assistance (OT/PT). Outcome: Completed/Met Flowsheets (Taken 03/05/2022 0938 by Lorie Phenix, PT) LTG: Pt will perform furniture transfers with assist:: Minimal Assistance - Patient > 75%   Problem: RH Ambulation Goal: LTG Patient will ambulate in controlled environment (PT) Description: LTG: Patient will ambulate in a controlled environment, # of feet with assistance (PT). Outcome: Completed/Met Flowsheets Taken 03/12/2022 1729 by Alger Simons, PT LTG: Pt will ambulate in controlled environ  assist needed:: Minimal Assistance - Patient > 75% Taken 03/05/2022 0938 by Lorie Phenix, PT LTG: Ambulation distance in controlled environment: 70ft with LRAD Goal: LTG Patient will ambulate in home environment (PT) Description: LTG: Patient will ambulate in home environment, # of feet with assistance (PT). Outcome: Completed/Met Flowsheets (Taken 03/05/2022 0938 by Lorie Phenix, PT) LTG: Pt will ambulate in home environ  assist needed:: Minimal Assistance - Patient > 75% LTG: Ambulation distance in home environment: 77ft with LRAD   Problem: RH Stairs Goal: LTG Patient will ambulate up and down stairs w/assist (PT) Description: LTG: Patient will ambulate up and down # of stairs with assistance (PT) Outcome: Not Applicable Note: Pt discharging to home with level entry. Goal not applicable.

## 2022-03-12 NOTE — Progress Notes (Signed)
PROGRESS NOTE   Subjective/Complaints: Remains aphasic   ROS: Limited due to language/communication   Objective:   No results found. Recent Labs    03/10/22 0506  WBC 7.5  HGB 10.7*  HCT 32.8*  PLT 237      Recent Labs    03/10/22 0506  NA 135  K 3.7  CL 100  CO2 27  GLUCOSE 104*  BUN 24*  CREATININE 0.87  CALCIUM 8.6*      Intake/Output Summary (Last 24 hours) at 03/12/2022 0801 Last data filed at 03/12/2022 0700 Gross per 24 hour  Intake 681 ml  Output 200 ml  Net 481 ml      Pressure Injury 02/07/22 Coccyx Medial Stage 1 -  Intact skin with non-blanchable redness of a localized area usually over a bony prominence. (Active)  02/07/22 1327  Location: Coccyx  Location Orientation: Medial  Staging: Stage 1 -  Intact skin with non-blanchable redness of a localized area usually over a bony prominence.  Wound Description (Comments):   Present on Admission: Yes    Physical Exam: Vital Signs Blood pressure 124/61, pulse 80, temperature 98.4 F (36.9 C), temperature source Oral, resp. rate 16, height 5\' 5"  (1.651 m), weight 64.5 kg, SpO2 95 %.   General: No acute distress Psych: Mood and affect are appropriate Heart: Regular rate and rhythm no rubs murmurs or extra sounds Lungs: Clear to auscultation, breathing unlabored, no rales or wheezes Abdomen: Positive bowel sounds, soft nontender to palpation, nondistended Extremities: Nonpitting edema R hand, wrist. MSK no evidence of right wrist or finger erythema, ecchymosis, no pain with passive wrist ext   Skin: No evidence of breakdown, no evidence of rash. No erythema or warmth.  Neurologic: Awake, alert, does not respond to orientation quesitons. Does nod Y/N.  No hypersensitivity to touch RIght hand 0/5 motors RUE  4/5 RLE    Assessment/Plan: 1. Functional deficits which require 3+ hours per day of interdisciplinary therapy in a comprehensive  inpatient rehab setting. Physiatrist is providing close team supervision and 24 hour management of active medical problems listed below. Physiatrist and rehab team continue to assess barriers to discharge/monitor patient progress toward functional and medical goals  Care Tool:  Bathing    Body parts bathed by patient: Chest, Face, Abdomen, Right upper leg, Left upper leg   Body parts bathed by helper: Right arm, Left arm, Buttocks, Front perineal area, Right lower leg, Left lower leg     Bathing assist Assist Level: Moderate Assistance - Patient 50 - 74%     Upper Body Dressing/Undressing Upper body dressing   What is the patient wearing?: Pull over shirt    Upper body assist Assist Level: Moderate Assistance - Patient 50 - 74%    Lower Body Dressing/Undressing Lower body dressing      What is the patient wearing?: Incontinence brief, Pants     Lower body assist Assist for lower body dressing: Maximal Assistance - Patient 25 - 49%     Toileting Toileting    Toileting assist Assist for toileting: Total Assistance - Patient < 25%     Transfers Chair/bed transfer  Transfers assist  Chair/bed transfer activity did  not occur: Safety/medical concerns  Chair/bed transfer assist level: Minimal Assistance - Patient > 75%     Locomotion Ambulation   Ambulation assist   Ambulation activity did not occur: Safety/medical concerns          Walk 10 feet activity   Assist  Walk 10 feet activity did not occur: Safety/medical concerns        Walk 50 feet activity   Assist Walk 50 feet with 2 turns activity did not occur: Safety/medical concerns         Walk 150 feet activity   Assist Walk 150 feet activity did not occur: Safety/medical concerns         Walk 10 feet on uneven surface  activity   Assist Walk 10 feet on uneven surfaces activity did not occur: Safety/medical concerns         Wheelchair     Assist Is the patient using a  wheelchair?: Yes (Did not use one PTA) Type of Wheelchair: Manual Wheelchair activity did not occur: Safety/medical concerns         Wheelchair 50 feet with 2 turns activity    Assist    Wheelchair 50 feet with 2 turns activity did not occur: Safety/medical concerns       Wheelchair 150 feet activity     Assist  Wheelchair 150 feet activity did not occur: Safety/medical concerns       Blood pressure 124/61, pulse 80, temperature 98.4 F (36.9 C), temperature source Oral, resp. rate 16, height 5\' 5"  (1.651 m), weight 64.5 kg, SpO2 95 %.  Medical Problem List and Plan: 1. 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  Antithrombotics: -DVT/anticoagulation:  Pharmaceutical: Lovenox             -antiplatelet therapy: DAPT X 3 months followed by ASA alone.  3. Pain: continue Tylenol prn.  CVA related pain developing tone in RIght finger an wrist flexors, cont ROM, premedicate with tylenol discussed with RN and OT- trial Voltaren gel               - R wrist xray ordered; NWB and in resting splint until fracture can be ruled out 9/15              - 9/16 - Xray with arthritis, worse at first [facility] joint. No Fx. Placed order for cock up splint.             Has pain with ROM RUE cont voltaren gel, wrist splint at noc, prn wrist splint during the day  4. Mood/Behavior/Sleep: LCSW to follow for evaluation and support.              -antipsychotic agents: N/A 5. Neuropsych/cognition: This patient is not fully capable of making decisions on his own behalf due to aphasia. 6. Skin/Wound Care: Routine pressure relief measures.  7. Fluids/Electrolytes/Nutrition: Monitor I/O-  oral intake ok          HypoK+ resolved on supplements  Latest Ref Rng & Units 03/10/2022    5:06 AM 03/03/2022    8:00 AM 02/25/2022    6:38 AM  BMP  Glucose 70 - 99 mg/dL 104  128  117   BUN 8 - 23 mg/dL 24  25  24    Creatinine 0.61 - 1.24 mg/dL 0.87  0.83  1.01   Sodium 135 - 145 mmol/L 135  134  134   Potassium 3.5 - 5.1 mmol/L 3.7  3.8  4.2   Chloride 98 - 111 mmol/L 100  100  97   CO2 22 - 32 mmol/L 27  27  26    Calcium 8.9 - 10.3 mg/dL 8.6  9.0  8.5     HypoK+ will supplement , not on diuretics but had several stools on 8/27  9/2- last K+ 4.2- doing well 8. L-MCA infarct with hemorrhagic conversion: DAPT X 90 day followed by ASA alone 9. HTN: Monitor BP TID. BP remains labile. Continue Proscar.  --Continue lopressor to 12.5mg  BID given hypotension  Vitals:   03/11/22 1937 03/12/22 0537  BP: (!) 102/52 124/61  Pulse: 73 80  Resp: 17 16  Temp: 98.4 F (36.9 C) 98.4 F (36.9 C)  SpO2: 98% 95%   Controlled 9/20  10. Dysphagia: Continue D1, honey thick liquids. Needs assistance for feeding and supervision for safety.  --hypernatremia/AKI likely due to dysphagia diet 11. BPH: Monitor for any voiding difficulties. Will order PVR checks as off his meds.  --Proscar was not resumed. Flomax was d/c on 08/20.   13. Covid + asymptomatic,finished 10d in room quarantine on 9/8    Latest Ref Rng & Units 03/10/2022    5:06 AM 03/03/2022    8:00 AM 02/27/2022    5:35 AM  CBC  WBC 4.0 - 10.5 K/uL 7.5  7.3  9.4   Hemoglobin 13.0 - 17.0 g/dL 10.7  11.6  10.7   Hematocrit 39.0 - 52.0 % 32.8  35.1  32.4   Platelets 150 - 400 K/uL 237  395  345   Leukocytosis resolved   14. Apneic episodes on continuous pulse ox while sleeping, does recover on own, consulted RT - aphasic cannot ask about am HA or other symptoms    LOS: 23 days A FACE TO FACE EVALUATION WAS PERFORMED  Charlett Blake 03/12/2022, 8:01 AM

## 2022-03-12 NOTE — Plan of Care (Signed)
  Problem: RH Grooming Goal: LTG Patient will perform grooming w/assist,cues/equip (OT) Description: LTG: Patient will perform grooming with assist, with/without cues using equipment (OT) Outcome: Not Progressing Flowsheets (Taken 03/12/2022 1233) LTG: Pt will perform grooming with assistance level of: (pt continues to need mod A (visual impairments, decreased motor planning)) -- Note: pt continues to need mod A (visual impairments, decreased motor planning

## 2022-03-12 NOTE — Progress Notes (Signed)
Occupational Therapy Session Note  Patient Details  Name: Paul Bradshaw MRN: [id number] Date of Birth: 08/28/1925  Today's Date: 03/12/2022 OT Individual Time: 0938-1829 OT Individual Time Calculation (min): 50 min    Short Term Goals: Week 3:  OT Short Term Goal 1 (Week 3): STGs = LTGs  Skilled Therapeutic Interventions/Progress Updates:  Pt awake in w/c with family present upon OT arrival to the room. Pt provides no verbalizations during session, however, pt attempts to communicate verbally. Son's friend (pt's DIL) reports, "I had a stroke on this side and a seizure on the other. I sometimes have balance issues." Pt in agreement for OT session. Son's friend (pt's DIL) primarily involved in family education.   Therapy Documentation Precautions:  Precautions Precautions: Fall Precaution Comments: R hemipareisis, expressive>receptive aphasia Restrictions Weight Bearing Restrictions: No Other Position/Activity Restrictions: using R wrist cock up splint for support of arthritic wrist Vital Signs: Please see "Flowsheet" for most recent vitals charted by nursing staff.  Pain: Pain Assessment Pain Scale: 0-10 Pain Score: 0-No pain  ADL: Pt declines need to perform ADLs at this time.   Family Training: Pt's son and son's friend (pt's DIL?) present for family education. OT discussed with pt's primary OT to discuss plan for family training. OT requested to practice transfers to [facility] placed over toilet and then to tub-bench to best simulate home environment. OT arranged bathroom in ADL apartment to closely simulate home to aid in DC planning. OT provides recommendation for caregiver to remove leg rests in the hallway, place self in front of w/c, and then pull w/c in the bathroom to allow caregiver to be placed in front of pt to provide assist with step-pivot transfers. OT provides education on recommendation to perform step-pivot transfer from w/c > BSC placed over toilet > tub-bench in order  to improve safety with tub-bench transfers when planning to shower and decrease fall risks. OT recommends that pt can get undressed/dressed while seated on BSC placed over the toilet before/after shower and then replace gait belt prior to performing mobility. Pt demo's increased motor planning difficulties with performing step-pivot transfer from w/c > BSC > tub-bench due to pt stepping and pivoting to R side. OT then provides demo on techniques to elevate BLE over edge of tub and scoot in to to the shower. Pt then able to perform step-pivot transfer to the L from tub-bench > BSC > w/c with son's friend (pt's DIL?) providing adequate CGA with pt using LUE on arm rests. OT reinforced the importance of performing pivots to a surface and not having pt solely turn to the L with no surface to quickly sit on d/t pt will intermittently sit prematurely which could lead to fall risks. OT provided skilled demo on technique to position RUE using gait belt around waist as well to decrease the amount of objects that could cause increased difficulty with caregiver providing adequate assistance. Family verbalizes understanding. Pt's son and son's friend (pt's DIL?) reports no other self-care concerns at this time.    Pt requested to stay in the w/c at end of session. Pt left sitting comfortably in the w/c with personal belongings and call light within reach, family present in room, and comfort needs attended to.   Therapy/Group: Individual Therapy  [name] 03/12/2022, 2:10 PM

## 2022-03-12 NOTE — Plan of Care (Signed)
  Problem: RH Balance Goal: LTG: Patient will maintain dynamic sitting balance (OT) Description: LTG:  Patient will maintain dynamic sitting balance with assistance during activities of daily living (OT) Outcome: Adequate for Discharge Flowsheets (Taken 03/12/2022 1233) LTG: Pt will maintain dynamic sitting balance during ADLs with: (pt continues to need min A due to kyphosis and decreased attention to his balance) -- Note: pt continues to need min A due to kyphosis and decreased attention to his balance   Problem: RH Dressing Goal: LTG Patient will perform upper body dressing (OT) Description: LTG Patient will perform upper body dressing with assist, with/without cues (OT). Outcome: Adequate for Discharge Note: Pt continues to need mod A as he uses tight underwear tank tops and button up shirts.    Problem: RH Toilet Transfers Goal: LTG Patient will perform toilet transfers w/assist (OT) Description: LTG: Patient will perform toilet transfers with assist, with/without cues using equipment (OT) Outcome: Adequate for Discharge Flowsheets (Taken 03/12/2022 1233) LTG: Pt will perform toilet transfers with assistance level of: (Pt needs closer to min A, but can be CGA when he is attending well to task.) -- Note: Pt needs closer to min A, but can be CGA when he is attending well to task.

## 2022-03-12 NOTE — Patient Care Conference (Signed)
Inpatient RehabilitationTeam Conference and Plan of Care Update Date: 03/12/2022   Time: 10:07 AM    Patient Name: Paul Bradshaw      Medical Record Number: [id number]  Date of Birth: 30-Jul-1925 Sex: Male         Room/Bed: 4M01C/4M01C-01 Payor Info: Payor: Marine scientist / Plan: [employer] MEDICARE / Product Type: *No Product type* /    Admit Date/Time:  02/17/2022  1:00 PM  Primary Diagnosis:  Acute ischemic left middle cerebral artery (MCA) stroke [facility])  Hospital Problems: Principal Problem:   Acute ischemic left middle cerebral artery (MCA) stroke ([place]) Active Problems:   Essential hypertension   Protein-calorie malnutrition, severe   Dysphagia due to recent stroke   Aphasia due to recent cerebral infarction    Expected Discharge Date: Expected Discharge Date: 03/13/22  Team Members Present: Physician leading conference: Dr. Alysia Penna Social Worker Present: Erlene Quan, BSW Nurse Present: Dorien Chihuahua, RN PT Present: Barrie Folk, PT OT Present: Meriel Pica, OT SLP Present: Sherren Kerns, SLP PPS Coordinator present : Gunnar Fusi, SLP     Current Status/Progress Goal Weekly Team Focus  Bowel/Bladder   Pt is incontinent of bowel/bladder  Pt will become continent of bowel/bladder  Will assess qshift and PRN   Swallow/Nutrition/ Hydration   Dys 2 diet and HTL - mod A  mod A  tolerance of dys 2 diet, HTL, family edu   ADL's   mod - max stand balance depending on day, mod UB dressing, max -total LB dressing, mod bathing, total toileting, toilet transfers min A  mod A with stand balance during LB self care, bathing; min UB dressing; max LB dressing; total A Toileting with goal of maintaining standing for 3 min to allow caregiver time to cleanse pt and do clothing management  family education, ADL training, functional mobility, RUE management to reduce pain   Mobility   Pt performing bed mobility from MinA up to supervision, transfers with HW and  MinA up to [place], ambulation not recommended with family until proressed and cleared from [place], improved ambulation on Tues using HW.  overall CGA/ MinA  - dependent on pt's fatigue level - should reach/ surpass LTGs  grad day scheduled for today, however pt's son's girlfriend does not have access to her car until later on Thu and would prefer to transport home via car rather than non-emergency transport.   Communication   mod A comprehension of basic commands and yes/no questions, max-to-total A for communication of functional needs  mod A basic auditory comprehension, max A expression through multimodal means  multimodal communication, following commands, vocalizations, yes/no responses, family edu   Safety/Cognition/ Behavioral Observations            Pain   Pt shows no sign of pain  Pt will remain pain free  Will assess qshift and PRN   Skin   Pt has a stage 1 on sacrum  Pt's sacrum will heal  Will assess qshift and PRN     Discharge Planning:  Discharging home with son on Thursday. HH:PT/OT/SLP. Education schuedled this week with patient son and friend.   Team Discussion: Patient with wrist pain; better with splint and Voltaren gel prn. Some spontaneous response post left MCA CVA but little carryover of education.   Patient on target to meet rehab goals: Currently need CGA for ambulation using a RW. Able to hold standing balance to decrease caregiver's burden but function is inconsistent due to fatigue and poor carry over.  Needs max assist - total assist to communicate needs;  can be continent with toileting routine. Needs mod assist for comprehension.   *See Care Plan and progress notes for long and short-term goals.   Revisions to Treatment Plan:  MBS; aspiration; thickener ordered for discharge and changed to a D2 - Honey diet Downgraded OT and PT goals   Teaching Needs: Safety, medications, dietary modifications, skin care, transfers, toileting routine, etc  Current Barriers to  Discharge: Home enviroment access/layout and Lack of/limited family support  Possible Resolutions to Barriers: Family education HH follow up services DME: Hemi Walker, W/C, half lap tray, BSC and TTB, suction kit      Medical Summary Current Status: no medical issues, RIght wrist doing  better,aphasic  Barriers to Discharge: Medical stability   Possible Resolutions to Barriers/Weekly Focus: prepare for d/c in 2 d, family training and equipment ordering   Continued Need for Acute Rehabilitation Level of Care: The patient requires daily medical management by a physician with specialized training in physical medicine and rehabilitation for the following reasons: Direction of a multidisciplinary physical rehabilitation program to maximize functional independence : Yes Medical management of patient stability for increased activity during participation in an intensive rehabilitation regime.: Yes Analysis of laboratory values and/or radiology reports with any subsequent need for medication adjustment and/or medical intervention. : Yes   I attest that I was present, lead the team conference, and concur with the assessment and plan of the team.   Margarito Liner 03/12/2022, 2:28 PM

## 2022-03-12 NOTE — Progress Notes (Signed)
Patient ID: Paul Bradshaw, male   DOB: [birth year], 88 y.o.   MRN: [id number]  Team Conference Report to Patient/Family  Team Conference discussion was reviewed with the patient and caregiver, including goals, any changes in plan of care and target discharge date.  Patient and caregiver express understanding and are in agreement.  The patient has a target discharge date of 03/13/22.  SW spoke with patient son, Legrand Como and provided team conference updates. Patient son plans to be presented tomorrow to pick patient up for discharge. No additional questions or concerns.  Dyanne Iha 03/12/2022, 1:30 PM

## 2022-03-12 NOTE — Progress Notes (Signed)
Physical Therapy Discharge Summary  Patient Details  Name: Paul Bradshaw MRN: [id number] Date of Birth: Nov 10, 1925  Date of Discharge from PT service:March 12, 2022  Today's Date: 03/12/2022 PT Individual Time: 6546-5035 PT Individual Time Calculation (min): 56 min    Patient has met 8 of 9 long term goals due to improved activity tolerance, improved balance, improved postural control, increased strength, and functional use of  right lower extremity.  Patient to discharge at a wheelchair level  CGA to [facility] when fatigued .   Patient's care partner is independent to provide the necessary physical and cognitive assistance at discharge.  Reasons goals not met: Stair goal deemed no longer applicable as pt is discharging to a location with no steps and level entry.   Recommendation:  Patient will benefit from ongoing skilled PT services in home health setting to continue to advance safe functional mobility, address ongoing impairments in strength, coordination, balance, activity tolerance, cognition, safety awareness, and to minimize fall risk.  Equipment: 16x16" hemi-height w/c with half lap tray, hemiwalker  Reasons for discharge: treatment goals met and discharge from hospital  Patient/family agrees with progress made and goals achieved: Yes  PT Discharge Precautions/Restrictions Precautions Precautions: Fall Precaution Comments: R hemipareisis, expressive>receptive aphasia Restrictions Weight Bearing Restrictions: No Other Position/Activity Restrictions: using R wrist cock up splint during day for support of arthritic wrist; resting hand splint overnight Vital Signs Therapy Vitals Temp: 98.3 F (36.8 C) Pulse Rate: 64 Resp: 14 BP: (!) 123/47 Patient Position (if appropriate): Lying Oxygen Therapy SpO2: 99 % O2 Device: Room Air Pain Pain Assessment Pain Scale: 0-10 Pain Score: 0-No pain Pain Interference Pain Interference Pain Effect on Sleep: 1. Rarely or not at  all Pain Interference with Therapy Activities: 2. Occasionally Pain Interference with Day-to-Day Activities: 2. Occasionally Vision/Perception  Vision - History Ability to See in Adequate Light: 1 Impaired Vision - Assessment Eye Alignment: Within Functional Limits Ocular Range of Motion: Within Functional Limits Alignment/Gaze Preference: Within Defined Limits Perception Perception: Impaired Inattention/Neglect: Does not attend to right visual field Praxis Praxis: Impaired Praxis Impairment Details: Motor planning;Initiation Praxis-Other Comments: Pt continues to require vc for safety and improved techniques during mobility  Cognition Overall Cognitive Status: Impaired/Different from baseline Arousal/Alertness: Awake/alert Orientation Level: Oriented to person Year:  (difficult to assess 2/2 severe aphasia impacting verbal expression, comprehension, reading, and writing) Month:  (difficult to assess 2/2 severe aphasia impacting verbal expression, comprehension, reading, and writing) Day of Week:  (difficult to assess 2/2 severe aphasia impacting verbal expression, comprehension, reading, and writing) Attention: Focused;Sustained Focused Attention: Appears intact Sustained Attention: Appears intact Memory: Impaired Memory Impairment: Decreased recall of new information Problem Solving: Impaired Safety/Judgment: Impaired Comments: difficult to assess 2/2 severe aphasia impacting verbal expression, comprehension, reading, and writing Sensation Sensation Light Touch: Impaired by gross assessment Coordination Gross Motor Movements are Fluid and Coordinated: No Fine Motor Movements are Fluid and Coordinated: No Heel Shin Test: unable to follow verbal instructions; improved with verbal and visual Motor  Motor Motor: Hemiplegia;Motor apraxia Motor - Discharge Observations: improved from eval d/t improved motor control, strength, and learned movements  Mobility Bed Mobility Bed  Mobility: Sit to Supine;Supine to Sit Supine to Sit: Supervision/Verbal cueing Sit to Supine: Supervision/Verbal cueing Transfers Transfers: Sit to Stand;Stand to Sit;Stand Pivot Transfers Sit to Stand: Contact Guard/Touching assist Stand to Sit: Contact Guard/Touching assist;Supervision/Verbal cueing Stand Pivot Transfers: Minimal Assistance - Patient > 75%;Contact Guard/Touching assist Stand Pivot Transfer Details: Verbal cues for safe use of DME/AE;Verbal  cues for precautions/safety;Tactile cues for placement;Verbal cues for technique;Verbal cues for gait pattern;Verbal cues for sequencing;Tactile cues for sequencing Transfer (Assistive device): Hemi-walker (RW to LUE and HHA to RUE) Locomotion  Gait Ambulation: Yes Gait Distance (Feet): 40 Feet Assistive device: Hemi-walker Gait Gait: Yes Gait Pattern: Step-to pattern;Decreased step length - right;Decreased stance time - right;Decreased hip/knee flexion - right;Trunk flexed;Wide base of support Gait velocity: decreased Stairs / Additional Locomotion Stairs: No Wheelchair Mobility Wheelchair Mobility: Yes Wheelchair Assistance: Development worker, international aid: Both lower extermities Wheelchair Parts Management: Needs assistance Distance: 75 ft  Trunk/Postural Assessment  Cervical Assessment Cervical Assessment: Exceptions to [facility] (forward head with forward flexion) Thoracic Assessment Thoracic Assessment: Exceptions to [facility] (rounded shoulders with kyphosis) Lumbar Assessment Lumbar Assessment: Exceptions to [facility] (posterior pelvic tilt) Postural Control Postural Control: Deficits on evaluation Trunk Control: Pt initiates stance in postural flexion, mildly improves with time following vc for upright stance  Balance Balance Balance Assessed: Yes Static Sitting Balance Static Sitting - Level of Assistance: 5: Stand by assistance Dynamic Sitting Balance Dynamic Sitting - Level of Assistance: Other (comment)  (CGA) Static Standing Balance Static Standing - Level of Assistance: 4: Min assist;5: Stand by assistance Dynamic Standing Balance Dynamic Standing - Level of Assistance: 4: Min assist;5: Stand by assistance Extremity Assessment      RLE Assessment RLE Assessment: Exceptions to [facility] RLE Strength RLE Overall Strength: Deficits Right Hip Flexion: 4-/5 Right Hip Extension: 4-/5 Right Hip ABduction: 4+/5 Right Hip ADduction: 4/5 Right Knee Flexion: 4-/5 Right Knee Extension: 4/5 Right Ankle Dorsiflexion: 4+/5 Right Ankle Plantar Flexion: 4/5 LLE Assessment LLE Assessment: Exceptions to WFL LLE Strength LLE Overall Strength: Deficits Left Hip Flexion: 4+/5 Left Hip Extension: 4/5 Left Hip ABduction: 4+/5 Left Hip ADduction: 4+/5 Left Knee Flexion: 4-/5 Left Knee Extension: 4+/5 Left Ankle Dorsiflexion: 4+/5 Left Ankle Plantar Flexion: 4/5  Skilled Intervention:  Patient seated upright in w/c on entrance to room. Son and girlfriend present for family education session. Patient alert and agreeable to PT session.   Patient with no pain complaint at start of session.   Therapeutic Activity: Bed Mobility: Pt performed supine <> sit at R side of bed with supervision and use of bed rails. Son's girlfriend close by to provide vc for technique throughout. Pt requires vc/tc to bring LE into hooklying but then is able to push with BLE and use elbows to push self up higher in bed for improved positioning with supervision. Son's girlfriend providing good cues for pt to perform on own.  Transfers: Pt performed sit<>stand and stand pivot transfers throughout session with CGA and some need for vc/ tc in order to continue corect sequence of stepping BLE to complete pivot turns. Car transfer initiated with son's girlfriend providing CGA throughout and vc/tc for sequencing of pivot stepping. On initial attempt, pt steps forward into footwell with LLE first and despite corrective vc/ tc, he is able to  maintain RLE knee extension to hold self halfway to seat. Completes with CGA. Return to w/c with CGA. Second attempt following visual demonstration with verbal instruction and pt able to produce pivot turn with CGA. All vc provided work and reminded son's girlfriend to use tc to leg you want to move if vc isn't enough.  Educated both to find vc that work for pt and continue to use same cues for consistency.   Son's girlfriend demos ability to set up brakes, position w/c, and remove/ don leg rests to w/c.   Patient supine  in bed at end of session asleep with brakes locked, bed alarm set, and all needs within reach.   Alger Simons PT, DPT, CSRS 03/12/2022, 4:59 PM

## 2022-03-12 NOTE — Progress Notes (Signed)
Occupational Therapy Discharge Summary  Patient Details  Name: Paul Bradshaw MRN: [id number] Date of Birth: 01-25-1926  Date of Discharge from OT service:March 12, 2022   Patient has met 9 of 13 long term goals due to improved balance, functional use of  RIGHT lower extremity, improved attention, and improved awareness.  Patient to discharge at overall Mod Assist level with self care and min A level with transfers.  Patient's care partner is independent to provide the necessary physical and cognitive assistance at discharge.  Pt's son and daughter in law attended 3 in person sessions to learn how to assist with self care and transfers.   Reasons goals not met: Pt continues to need increased A with balance, grooming, UB dressing, and toilet transfers.  Due to progress, many of his goals were upgraded but then he was not able to meet all upgraded goals.   Recommendation:  Patient will benefit from ongoing skilled OT services in home health setting to continue to advance functional skills in the area of BADL and Reduce care partner burden.  Equipment: BSC and tub transfer bench   Reasons for discharge: treatment goals met  Patient/family agrees with progress made and goals achieved: Yes  OT Discharge Precautions/Restrictions  Precautions Precautions: Fall Precaution Comments: R hemipareisis, expressive>receptive aphasia Restrictions Weight Bearing Restrictions: No Other Position/Activity Restrictions: using R wrist cock up splint for support of arthritic wrist   ADL ADL Eating: Moderate assistance Grooming: Moderate cueing, Supervision/safety Where Assessed-Grooming: Edge of bed Upper Body Bathing: Minimal assistance Where Assessed-Upper Body Bathing: Shower Lower Body Bathing: Moderate assistance, Moderate cueing (Pt able to bathe 2.5/5 body parts while seated on tub-bench in the shower and standing with minimal assist with use of grab bars while therapist provides assist to  bathe buttocks. Pt able to bathe B thighs & front peri-area with mod VCs for sequencing) Where Assessed-Lower Body Bathing: Shower Upper Body Dressing: Moderate assistance (mod cues) Where Assessed-Upper Body Dressing: Wheelchair (w/c) Lower Body Dressing:  (pt holds onto hemi walker or w.c arm rest for support) Where Assessed-Lower Body Dressing: Wheelchair (w/c) Toileting: Dependent (Pt requires total assistance to complete 3/3 toileting tasks after having a BM. Pt able to maintain standing balance with LUE on arm rest of w/c while OT provides assistance for toileting tasks.) Where Assessed-Toileting: Bedside Commode (BSC placed over toilet) Toilet Transfer: Minimal assistance Toilet Transfer Method: Stand pivot Toilet Transfer Equipment: Bedside commode, Grab bars (BSC placed over toilet) Gaffer Transfer: Minimal assistance Social research officer, government Method: Radiographer, therapeutic: Transfer tub bench, Grab bars Tub bench transfers:  min -mod A  Vision Baseline Vision/History: 1 Wears glasses Patient Visual Report: Other (comment) (pt unable to respond) Alignment/Gaze Preference: Gaze left Tracking/Visual Pursuits: Other (comment) (unable to follow directions enough for accurate testing) Visual Fields: Other (comment) (unable to follow directions enough for accurate testing and limited due to aphasia) Perception  Perception: Impaired Inattention/Neglect: Does not attend to right visual field;Does not attend to right side of body Praxis Praxis: Impaired Praxis Impairment Details: Motor planning;Initiation Praxis-Other Comments: pt continues to need constant cues for mobility techniques Cognition Cognition Overall Cognitive Status: Impaired/Different from baseline Arousal/Alertness: Awake/alert Orientation Level: Nonverbal/unable to assess Memory: Impaired Memory Impairment: Decreased recall of new information Problem Solving: Impaired Problem Solving  Impairment: Functional basic Brief Interview for Mental Status (BIMS) Repetition of Three Words (First Attempt): No answer (expressive aphasia, non verbal) Temporal Orientation: Year: No answer Temporal Orientation: Month: No answer Temporal Orientation: Day: No  answer Recall: "Sock": No answer Recall: "Blue": No answer BIMS Summary Score: 99 Sensation Sensation Light Touch: Impaired by gross assessment Hot/Cold: Impaired by gross assessment Proprioception: Impaired by gross assessment Stereognosis: Impaired by gross assessment Coordination Gross Motor Movements are Fluid and Coordinated: No Fine Motor Movements are Fluid and Coordinated: No Coordination and Movement Description: dense RUE hemiplegia, decreased strength RLE Finger Nose Finger Test: unable to assess Motor  Motor Motor: Hemiplegia;Motor apraxia Mobility    Min A bed mobility, CGA sit to stand, min to CGA stand pivot transfers Trunk/Postural Assessment  Thoracic Assessment Thoracic Assessment:  (mod imp due due severe kyphosis) Postural Control Postural Control: Deficits on evaluation Trunk Control: Pt initiates stance in significant postural flexion, mildly improves with time following vc for upright stance  Balance Static Sitting Balance Static Sitting - Level of Assistance: 5: Stand by assistance Dynamic Sitting Balance Dynamic Sitting - Level of Assistance: 4: Min assist Static Standing Balance Static Standing - Level of Assistance: 4: Min assist Dynamic Standing Balance Dynamic Standing - Level of Assistance: 3: Mod assist Extremity/Trunk Assessment RUE Assessment Active Range of Motion (AROM) Comments: pt is having spontaneous sh flex and abd to 30 degrees; can not lift arm on command; will do it on his own when he needs to lift arm during bathing and dressing Brunstrum level for arm: Stage II Synergy is developing Brunstrum level for hand: Stage I Flaccidity RUE Tone RUE Tone: Hypotonic  LUE WFL     Rashmi Tallent 03/12/2022, 12:47 PM

## 2022-03-13 ENCOUNTER — Other Ambulatory Visit (HOSPITAL_COMMUNITY): Payer: Self-pay

## 2022-03-13 NOTE — Progress Notes (Signed)
Inpatient Rehabilitation Discharge Medication Review by a Pharmacist  A complete drug regimen review was completed for this patient to identify any potential clinically significant medication issues.  High Risk Drug Classes Is patient taking? Indication by Medication  Antipsychotic No   Anticoagulant No   Antibiotic No   Opioid No   Antiplatelet Yes ASA/Plavix x 90 days (until 05/09/22), then ASA alone - stroke  Hypoglycemics/insulin No   Vasoactive Medication Yes Metoprolol - HTN  Chemotherapy No   Other Yes Pepcid - GERD Atorvastatin - HLD Proscar - BPH     Type of Medication Issue Identified Description of Issue Recommendation(s)  Drug Interaction(s) (clinically significant)     Duplicate Therapy     Allergy     No Medication Administration End Date     Incorrect Dose     Additional Drug Therapy Needed     Significant med changes from prior encounter (inform family/care partners about these prior to discharge).    Other       Clinically significant medication issues were identified that warrant physician communication and completion of prescribed/recommended actions by midnight of the next day:  No  Name of provider notified for urgent issues identified:   Provider Method of Notification:   Pharmacist comments:   Time spent performing this drug regimen review (minutes):  [place], PharmD, BCPS 03/12/2022 3:17 PM

## 2022-03-13 NOTE — Progress Notes (Signed)
PROGRESS NOTE   Subjective/Complaints:   No issues overnite remains aphasic   ROS: Limited due to language/communication   Objective:   No results found. No results for input(s): "WBC", "HGB", "HCT", "PLT" in the last 72 hours.    No results for input(s): "NA", "K", "CL", "CO2", "GLUCOSE", "BUN", "CREATININE", "CALCIUM" in the last 72 hours.    Intake/Output Summary (Last 24 hours) at 03/13/2022 0754 Last data filed at 03/12/2022 1700 Gross per 24 hour  Intake 110 ml  Output --  Net 110 ml      Pressure Injury 02/07/22 Coccyx Medial Stage 1 -  Intact skin with non-blanchable redness of a localized area usually over a bony prominence. (Active)  02/07/22 1327  Location: Coccyx  Location Orientation: Medial  Staging: Stage 1 -  Intact skin with non-blanchable redness of a localized area usually over a bony prominence.  Wound Description (Comments):   Present on Admission: Yes    Physical Exam: Vital Signs Blood pressure (!) 128/52, pulse 69, temperature 97.9 F (36.6 C), temperature source Oral, resp. rate 16, height 5\' 5"  (1.651 m), weight 64.5 kg, SpO2 96 %.   General: No acute distress Psych: Mood and affect are appropriate Heart: Regular rate and rhythm no rubs murmurs or extra sounds Lungs: Clear to auscultation, breathing unlabored, no rales or wheezes Abdomen: Positive bowel sounds, soft nontender to palpation, nondistended Extremities: Nonpitting edema R hand, wrist. MSK no evidence of right wrist or finger erythema, ecchymosis, no pain with passive wrist ext   Skin: No evidence of breakdown, no evidence of rash. No erythema or warmth.  Neurologic: Awake, alert, does not respond to orientation quesitons. Does nod Y/N.  No hypersensitivity to touch RIght hand 0/5 motors RUE  4/5 RLE    Assessment/Plan: 1. Functional deficits  Stable for D/C today F/u PCP in 3-4 weeks F/u PM&R 2 weeks F/u neuro  1-2 mo See D/C summary See D/C instructions  Care Tool:  Bathing    Body parts bathed by patient: Chest, Face, Abdomen, Right upper leg, Left upper leg, Front perineal area, Left arm   Body parts bathed by helper: Right lower leg, Left lower leg, Right arm, Buttocks     Bathing assist Assist Level: Moderate Assistance - Patient 50 - 74%     Upper Body Dressing/Undressing Upper body dressing   What is the patient wearing?: Pull over shirt, Button up shirt    Upper body assist Assist Level: Moderate Assistance - Patient 50 - 74%    Lower Body Dressing/Undressing Lower body dressing      What is the patient wearing?: Incontinence brief, Pants     Lower body assist Assist for lower body dressing: Maximal Assistance - Patient 25 - 49%     Toileting Toileting    Toileting assist Assist for toileting: Total Assistance - Patient < 25%     Transfers Chair/bed transfer  Transfers assist  Chair/bed transfer activity did not occur: Safety/medical concerns  Chair/bed transfer assist level: Minimal Assistance - Patient > 75%     Locomotion Ambulation   Ambulation assist   Ambulation activity did not occur: Safety/medical concerns  Walk 10 feet activity   Assist  Walk 10 feet activity did not occur: Safety/medical concerns        Walk 50 feet activity   Assist Walk 50 feet with 2 turns activity did not occur: Safety/medical concerns         Walk 150 feet activity   Assist Walk 150 feet activity did not occur: Safety/medical concerns         Walk 10 feet on uneven surface  activity   Assist Walk 10 feet on uneven surfaces activity did not occur: Safety/medical concerns         Wheelchair     Assist Is the patient using a wheelchair?: Yes (Did not use one PTA) Type of Wheelchair: Manual Wheelchair activity did not occur: Safety/medical concerns         Wheelchair 50 feet with 2 turns activity    Assist     Wheelchair 50 feet with 2 turns activity did not occur: Safety/medical concerns       Wheelchair 150 feet activity     Assist  Wheelchair 150 feet activity did not occur: Safety/medical concerns       Blood pressure (!) 128/52, pulse 69, temperature 97.9 F (36.6 C), temperature source Oral, resp. rate 16, height 5\' 5"  (1.651 m), weight 64.5 kg, SpO2 96 %.  Medical Problem List and Plan: 1. Functional deficits secondary to left MCA/ICA stroke with dense RUE HP and expressive aphasia-              -patient may shower             -ELOS/Goals: 9/21   2.  Antithrombotics: -DVT/anticoagulation:  Pharmaceutical: Lovenox             -antiplatelet therapy: DAPT X 3 months followed by ASA alone.  3. Pain: continue Tylenol prn.  CVA related pain developing tone in RIght finger an wrist flexors, cont ROM, premedicate with tylenol discussed with RN and OT- trial Voltaren gel               - R wrist xray ordered; NWB and in resting splint until fracture can be ruled out 9/15              - 9/16 - Xray with arthritis, worse at first [facility] joint. No Fx. Placed order for cock up splint.             Has pain with ROM RUE cont voltaren gel, wrist splint at noc, prn wrist splint during the day  4. Mood/Behavior/Sleep: LCSW to follow for evaluation and support.              -antipsychotic agents: N/A 5. Neuropsych/cognition: This patient is not fully capable of making decisions on his own behalf due to aphasia. 6. Skin/Wound Care: Routine pressure relief measures.  7. Fluids/Electrolytes/Nutrition: Monitor I/O- oral intake ok          HypoK+ resolved on supplements     Latest Ref Rng & Units 03/10/2022    5:06 AM 03/03/2022    8:00 AM 02/25/2022    6:38 AM  BMP  Glucose 70 - 99 mg/dL 04/27/2022  625  638   BUN 8 - 23 mg/dL 24  25  24    Creatinine 0.61 - 1.24 mg/dL 937   3.42   Sodium 135 - 145 mmol/L 135  134  134   Potassium 3.5 - 5.1 mmol/L 3.7  3.8  4.2   Chloride 98 -  111 mmol/L 100  100   97   CO2 22 - 32 mmol/L 27  27  26    Calcium 8.9 - 10.3 mg/dL 8.6  9.0  8.5     HypoK+ will supplement , not on diuretics but had several stools on 8/27  9/2- last K+ 4.2- doing well 8. L-MCA infarct with hemorrhagic conversion: DAPT X 90 day followed by ASA alone 9. HTN: Monitor BP TID. BP remains labile. Continue Proscar.  --Continue lopressor to 12.5mg  BID given hypotension  Vitals:   03/12/22 1938 03/13/22 0436  BP: (!) 121/58 (!) 128/52  Pulse: 77 69  Resp: 16 16  Temp: 98 F (36.7 C) 97.9 F (36.6 C)  SpO2: 98% 96%   Controlled 9/21  10. Dysphagia: Continue D1, honey thick liquids. Needs assistance for feeding and supervision for safety.  --hypernatremia/AKI likely due to dysphagia diet 11. BPH: Monitor for any voiding difficulties. Will order PVR checks as off his meds.  --Proscar was not resumed. Flomax was d/c on 08/20.   13. Covid + asymptomatic,finished 10d in room quarantine on 9/8    Latest Ref Rng & Units 03/10/2022    5:06 AM 03/03/2022    8:00 AM 02/27/2022    5:35 AM  CBC  WBC 4.0 - 10.5 K/uL 7.5  7.3  9.4   Hemoglobin 13.0 - 17.0 g/dL 04/29/2022  95.2  84.1   Hematocrit 39.0 - 52.0 % 32.8  35.1  32.4   Platelets 150 - 400 K/uL 237  395  345   Leukocytosis resolved      LOS: 24 days A FACE TO FACE EVALUATION WAS PERFORMED  32.4 Paul Bradshaw 03/13/2022, 7:54 AM

## 2022-03-14 ENCOUNTER — Telehealth: Payer: Self-pay | Admitting: Family Medicine

## 2022-03-14 ENCOUNTER — Telehealth: Payer: Self-pay

## 2022-03-14 NOTE — Telephone Encounter (Signed)
Deb called from [facility] stating that pt was discharged from hospital and needs [place]. Referral was supposed to be sent to [facility] by pts social worker Erlene Quan but says [facility] never received the referral.  Please order and send ASAP.

## 2022-03-14 NOTE — Patient Outreach (Signed)
  Care Coordination TOC Note Transition Care Management Follow-up Telephone Call Date of discharge and from where: [facility] 02/17/22-03/13/22 How have you been since you were released from the hospital? Per patient's son, Legrand Como, patient is doing well, he ate a good lunch but he has trouble getting around" Any questions or concerns? No  Items Reviewed: Did the pt receive and understand the discharge instructions provided? Yes  Medications obtained and verified? Yes  Other? No  Any new allergies since your discharge? No  Dietary orders reviewed? Yes Do you have support at home? Yes   Home Care and Equipment/Supplies: Were home health services ordered? yes If so, what is the name of the agency? Per chart, patient had referral to [facility] sent on 03/11/22 and this RNCM called to verify they would be setting up time to come to home.   [facility] shared they do not have a referral for this patient.  Called Darla Lesches, NP at [facility] and spoke to Ayden.  Explained that the referral for patient was not received by [facility].  She will speak to MD and will send referral in for patient. Has the agency set up a time to come to the patient's home? no Were any new equipment or medical supplies ordered?  No What is the name of the medical supply agency? N/A Were you able to get the supplies/equipment? not applicable Do you have any questions related to the use of the equipment or supplies? No  Functional Questionnaire: (I = Independent and D = Dependent) ADLs: D  Bathing/Dressing- D  Meal Prep- D  Eating- D  Maintaining continence- D  Transferring/Ambulation- D  Managing Meds- D  Follow up appointments reviewed:  PCP Hospital f/u appt confirmed? Yes  Scheduled to see Darla Lesches NP on 04/16/22 @ 2:05. [facility] f/u appt confirmed? No   Are transportation arrangements needed? No  If their condition worsens, is the pt aware to call  PCP or go to the Emergency Dept.? Yes Was the patient provided with contact information for the PCP's office or ED? Yes Was to pt encouraged to call back with questions or concerns? Yes  SDOH assessments and interventions completed:   Yes  Care Coordination Interventions Activated:  Yes   Care Coordination Interventions:  Referred for Care Coordination Services:  RN Care Coordinator   Encounter Outcome:  Pt. Visit Completed

## 2022-03-16 DIAGNOSIS — H9193 Unspecified hearing loss, bilateral: Secondary | ICD-10-CM

## 2022-03-16 DIAGNOSIS — D649 Anemia, unspecified: Secondary | ICD-10-CM

## 2022-03-16 DIAGNOSIS — U071 COVID-19: Secondary | ICD-10-CM

## 2022-03-16 DIAGNOSIS — R7989 Other specified abnormal findings of blood chemistry: Secondary | ICD-10-CM

## 2022-03-17 DIAGNOSIS — E785 Hyperlipidemia, unspecified: Secondary | ICD-10-CM | POA: Diagnosis not present

## 2022-03-17 DIAGNOSIS — R7303 Prediabetes: Secondary | ICD-10-CM | POA: Diagnosis not present

## 2022-03-17 DIAGNOSIS — Z9181 History of falling: Secondary | ICD-10-CM | POA: Diagnosis not present

## 2022-03-17 DIAGNOSIS — I69391 Dysphagia following cerebral infarction: Secondary | ICD-10-CM | POA: Diagnosis not present

## 2022-03-17 DIAGNOSIS — M5412 Radiculopathy, cervical region: Secondary | ICD-10-CM | POA: Diagnosis not present

## 2022-03-17 DIAGNOSIS — M5030 Other cervical disc degeneration, unspecified cervical region: Secondary | ICD-10-CM | POA: Diagnosis not present

## 2022-03-17 DIAGNOSIS — I69392 Facial weakness following cerebral infarction: Secondary | ICD-10-CM | POA: Diagnosis not present

## 2022-03-17 DIAGNOSIS — Z993 Dependence on wheelchair: Secondary | ICD-10-CM | POA: Diagnosis not present

## 2022-03-17 DIAGNOSIS — I69320 Aphasia following cerebral infarction: Secondary | ICD-10-CM | POA: Diagnosis not present

## 2022-03-17 DIAGNOSIS — Z7902 Long term (current) use of antithrombotics/antiplatelets: Secondary | ICD-10-CM | POA: Diagnosis not present

## 2022-03-17 DIAGNOSIS — I351 Nonrheumatic aortic (valve) insufficiency: Secondary | ICD-10-CM | POA: Diagnosis not present

## 2022-03-17 DIAGNOSIS — I700 Atherosclerosis of aorta: Secondary | ICD-10-CM | POA: Diagnosis not present

## 2022-03-17 DIAGNOSIS — I69351 Hemiplegia and hemiparesis following cerebral infarction affecting right dominant side: Secondary | ICD-10-CM | POA: Diagnosis not present

## 2022-03-17 DIAGNOSIS — Z7982 Long term (current) use of aspirin: Secondary | ICD-10-CM | POA: Diagnosis not present

## 2022-03-17 DIAGNOSIS — E43 Unspecified severe protein-calorie malnutrition: Secondary | ICD-10-CM | POA: Diagnosis not present

## 2022-03-17 DIAGNOSIS — G8929 Other chronic pain: Secondary | ICD-10-CM | POA: Diagnosis not present

## 2022-03-17 DIAGNOSIS — Z981 Arthrodesis status: Secondary | ICD-10-CM | POA: Diagnosis not present

## 2022-03-17 DIAGNOSIS — U071 COVID-19: Secondary | ICD-10-CM | POA: Diagnosis not present

## 2022-03-17 DIAGNOSIS — M1990 Unspecified osteoarthritis, unspecified site: Secondary | ICD-10-CM | POA: Diagnosis not present

## 2022-03-17 DIAGNOSIS — R1310 Dysphagia, unspecified: Secondary | ICD-10-CM | POA: Diagnosis not present

## 2022-03-17 DIAGNOSIS — I10 Essential (primary) hypertension: Secondary | ICD-10-CM | POA: Diagnosis not present

## 2022-03-17 DIAGNOSIS — H409 Unspecified glaucoma: Secondary | ICD-10-CM | POA: Diagnosis not present

## 2022-03-17 DIAGNOSIS — Z9049 Acquired absence of other specified parts of digestive tract: Secondary | ICD-10-CM | POA: Diagnosis not present

## 2022-03-17 NOTE — Telephone Encounter (Signed)
There was no face to face encounter under other orders for [facility] from the hospital. PCP has to see pt for a F2F visit in person or by video to place orders, hospital discharge also wanted pt seen in 1-2 days. Appte for video visit on 03/18/22.

## 2022-03-18 ENCOUNTER — Encounter: Payer: Self-pay | Admitting: Family Medicine

## 2022-03-18 ENCOUNTER — Telehealth (INDEPENDENT_AMBULATORY_CARE_PROVIDER_SITE_OTHER): Payer: Medicare Other | Admitting: Family Medicine

## 2022-03-18 DIAGNOSIS — I63512 Cerebral infarction due to unspecified occlusion or stenosis of left middle cerebral artery: Secondary | ICD-10-CM

## 2022-03-18 DIAGNOSIS — E43 Unspecified severe protein-calorie malnutrition: Secondary | ICD-10-CM | POA: Diagnosis not present

## 2022-03-18 DIAGNOSIS — I69391 Dysphagia following cerebral infarction: Secondary | ICD-10-CM | POA: Diagnosis not present

## 2022-03-18 DIAGNOSIS — I69320 Aphasia following cerebral infarction: Secondary | ICD-10-CM

## 2022-03-18 NOTE — Progress Notes (Signed)
Virtual Visit via MyChart Video Note Due to COVID-19 pandemic this visit was conducted virtually. This visit type was conducted due to national recommendations for restrictions regarding the COVID-19 Pandemic (e.g. social distancing, sheltering in place) in an effort to limit this patient's exposure and mitigate transmission in our community. All issues noted in this document were discussed and addressed.  A physical exam was not performed with this format.   I connected with [name]'s son on 03/18/2022 at 80 by MyChart Video and verified that I am speaking with the correct person using two identifiers. Paul Bradshaw is currently located at home and family is currently with them during visit. The provider, Monia Pouch, FNP is located in their office at time of visit.  I discussed the limitations, risks, security and privacy concerns of performing an evaluation and management service by virtual visit and the availability of in person appointments. I also discussed with the patient that there may be a patient responsible charge related to this service. The patient expressed understanding and agreed to proceed.  Subjective:  Patient ID: Paul Bradshaw, male    DOB: May 12, 1926, 88 y.o.   MRN: [id number]  Chief Complaint:  Hospitalization Follow-up   HPI: RANSON Bradshaw is a 88 y.o. male presenting on 03/18/2022 for Hospitalization Follow-up  Pt was admitted to [facility] on 02/07/2022 for acute L-MCA stroke and near occlusion of L-ICA with acute thrombus with multiple MCA branch occlusions. He was not considered a good candidate for intervention and was started on DAPT therapy. He has residual weakness, malaise, aphasia, and dysphagia. He was admitted to [facility] after discharge from hospital on 02/17/2022 and then home on 03/13/2022. Son is with him today. States he has to assist him with all ADLs including feeding, bathing, using restroom, and clothing. States he is  not eating much and sleeps all of the time. He was made a DNR during hospital admission. Offered hospice services but refused. Discussed this with him in detail today and he would like more information. Pt does need in home health, PT, and OT.     Relevant past medical, surgical, family, and social history reviewed and updated as indicated.  Allergies and medications reviewed and updated.   Past Medical History:  Diagnosis Date   Aortic regurgitation    Moderate   Arthritis    BPH (benign prostatic hyperplasia)    Cervical disc disease    Cervical radiculopathy    Hyperlipidemia    Hypertension    Prediabetes 02/16/2021   Staphylococcus aureus bacteremia 08/09/2012   TEE negative for vegetation or thrombus February 2014   Stroke [facility]) 2005 or 2006   3   Symptomatic carotid artery stenosis with infarction [facility]) 2005 or 2006   Status post right carotid endarterectomy    Past Surgical History:  Procedure Laterality Date   BACK SURGERY     CAROTID ENDARTERECTOMY     CATARACT EXTRACTION W/PHACO Left 02/15/2015   Procedure: CATARACT EXTRACTION PHACO AND INTRAOCULAR LENS PLACEMENT LEFT EYE CDE=30.93;  Surgeon: Tonny Branch, MD;  Location: [facility] ORS;  Service: Ophthalmology;  Laterality: Left;   CHOLECYSTECTOMY     EYE SURGERY     KIDNEY STONE SURGERY     LUMBAR LAMINECTOMY/DECOMPRESSION MICRODISCECTOMY Bilateral 01/23/2014   Procedure: LUMBAR LAMINECTOMY/DECOMPRESSION MICRODISCECTOMY 1 LEVEL L5-S1;  Surgeon: Charlie Pitter, MD;  Location: [facility] OR[facility];  Service: Neurosurgery;  Laterality: Bilateral;  LUMBAR LAMINECTOMY/DECOMPRESSION MICRODISCECTOMY 1 LEVEL L5-S1   TEE  WITHOUT CARDIOVERSION N/A 08/12/2012   Procedure: TRANSESOPHAGEAL ECHOCARDIOGRAM (TEE);  Surgeon: Josue Hector, MD;  Location: [facility] ENDO SUITE;  Service: Cardiovascular;  Laterality: N/A;   TEE WITHOUT CARDIOVERSION N/A 06/24/2013   Procedure: TRANSESOPHAGEAL ECHOCARDIOGRAM (TEE);  Surgeon: Satira Sark, MD;  Location:  [facility] END[facility];  Service: Endoscopy;  Laterality: N/A;    Social History   Socioeconomic History   Marital status: Married    Spouse name: Not on file   Number of children: Not on file   Years of education: Not on file   Highest education level: Not on file  Occupational History   Not on file  Tobacco Use   Smoking status: Never   Smokeless tobacco: Never  Vaping Use   Vaping Use: Never used  Substance and Sexual Activity   Alcohol use: No   Drug use: No   Sexual activity: Not Currently  Other Topics Concern   Not on file  Social History Narrative   Not on file   Social Determinants of Health   Financial Resource Strain: Not on file  Food Insecurity: Not on file  Transportation Needs: No Transportation Needs (03/14/2022)   PRAPARE - Transportation    Lack of Transportation (Medical): No    Lack of Transportation (Non-Medical): No  Physical Activity: Not on file  Stress: Not on file  Social Connections: Not on file  Intimate Partner Violence: Not on file    Outpatient Encounter Medications as of 03/18/2022  Medication Sig   acetaminophen (TYLENOL) 325 MG tablet Take 1-2 tablets (325-650 mg total) by mouth every 4 (four) hours as needed for mild pain.   albuterol (VENTOLIN HFA) 108 (90 Base) MCG/ACT inhaler Inhale 2 puffs into the lungs every 6 (six) hours as needed for shortness of breath or wheezing.   aspirin EC 81 MG tablet Take 1 tablet (81 mg total) by mouth daily with breakfast.   atorvastatin (LIPITOR) 10 MG tablet Take 1 tablet (10 mg total) by mouth daily.   brimonidine (ALPHAGAN) 0.2 % ophthalmic solution Place 1 drop into both eyes 3 (three) times daily.   clopidogrel (PLAVIX) 75 MG tablet Take 1 tablet (75 mg total) by mouth daily with breakfast.   dorzolamide-timolol (COSOPT) 22.3-6.8 MG/ML ophthalmic solution Place 1 drop into both eyes 2 (two) times daily.   famotidine (PEPCID) 20 MG tablet Take 1 tablet (20 mg total) by mouth 2 (two) times daily.    finasteride (PROSCAR) 5 MG tablet Take 1 tablet (5 mg total) by mouth daily.   latanoprost (XALATAN) 0.005 % ophthalmic solution Place 1 drop into both eyes every evening.   metoprolol tartrate (LOPRESSOR) 25 MG tablet Take 1/2 tablet (12.5 mg total) by mouth 2 (two) times daily.   Mouthwashes (MOUTH RINSE) LIQD solution 15 mLs by Mouth Rinse route 4 (four) times daily - after meals and at bedtime.   Multiple Vitamins-Minerals (MULTIVITAMIN PO) Take 1 tablet by mouth daily.   Xanthan Gum GEL Take 1 packet (15 g total) by mouth as needed.   No facility-administered encounter medications on file as of 03/18/2022.    Allergies  Allergen Reactions   Amlodipine Swelling    Swelling of lips.   Lisinopril Other (See Comments)    Elevated BP higher & causes a burning feeling on the inside.     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  Pt sitting on couch, son at side. Pt not speaking, eyes closed frequently.    Assessment and Plan: Jahzier was seen today for hospitalization follow-up.  Diagnoses and all orders for this visit:  Acute ischemic left middle cerebral artery (MCA) stroke (HCC) Protein-calorie malnutrition, severe Aphasia due to recent cerebral infarction Dysphagia due to recent stroke Pt unable to perform any ADLs on own. Needs home health, PT, and OT. Discussed hospice with son, would like more information. Pt will need repeat labs, will have home health nurse collect these.  -     CMP14+EGFR -     CBC with Differential/Platelet -     Ambulatory referral to Home Health     Follow Up Instructions: Return in about 4 weeks (around 04/15/2022), or if symptoms worsen or fail to improve.    I discussed the assessment and treatment plan with the patient. The patient was provided an opportunity to ask questions and all were  answered. The patient agreed with the plan and demonstrated an understanding of the instructions.   The patient was advised to call back or seek an in-person evaluation if the symptoms worsen or if the condition fails to improve as anticipated.  The above assessment and management plan was discussed with the patient. The patient verbalized understanding of and has agreed to the management plan. Patient is aware to call the clinic if they develop any new symptoms or if symptoms persist or worsen. Patient is aware when to return to the clinic for a follow-up visit. Patient educated on when it is appropriate to go to the emergency department.    I provided 30 minutes of time during this MyChart Video encounter.   Monia Pouch, FNP-C [facility] [address] [place], [place] [id number] [phone] 03/18/2022

## 2022-03-19 ENCOUNTER — Telehealth: Payer: Self-pay | Admitting: Family Medicine

## 2022-03-19 DIAGNOSIS — I351 Nonrheumatic aortic (valve) insufficiency: Secondary | ICD-10-CM | POA: Diagnosis not present

## 2022-03-19 DIAGNOSIS — Z981 Arthrodesis status: Secondary | ICD-10-CM | POA: Diagnosis not present

## 2022-03-19 DIAGNOSIS — I69351 Hemiplegia and hemiparesis following cerebral infarction affecting right dominant side: Secondary | ICD-10-CM | POA: Diagnosis not present

## 2022-03-19 DIAGNOSIS — R1310 Dysphagia, unspecified: Secondary | ICD-10-CM | POA: Diagnosis not present

## 2022-03-19 DIAGNOSIS — I700 Atherosclerosis of aorta: Secondary | ICD-10-CM | POA: Diagnosis not present

## 2022-03-19 DIAGNOSIS — I10 Essential (primary) hypertension: Secondary | ICD-10-CM | POA: Diagnosis not present

## 2022-03-19 DIAGNOSIS — Z9049 Acquired absence of other specified parts of digestive tract: Secondary | ICD-10-CM | POA: Diagnosis not present

## 2022-03-19 DIAGNOSIS — E43 Unspecified severe protein-calorie malnutrition: Secondary | ICD-10-CM | POA: Diagnosis not present

## 2022-03-19 DIAGNOSIS — Z7982 Long term (current) use of aspirin: Secondary | ICD-10-CM | POA: Diagnosis not present

## 2022-03-19 DIAGNOSIS — H409 Unspecified glaucoma: Secondary | ICD-10-CM | POA: Diagnosis not present

## 2022-03-19 DIAGNOSIS — I69392 Facial weakness following cerebral infarction: Secondary | ICD-10-CM | POA: Diagnosis not present

## 2022-03-19 DIAGNOSIS — Z9181 History of falling: Secondary | ICD-10-CM | POA: Diagnosis not present

## 2022-03-19 DIAGNOSIS — I69391 Dysphagia following cerebral infarction: Secondary | ICD-10-CM | POA: Diagnosis not present

## 2022-03-19 DIAGNOSIS — M5412 Radiculopathy, cervical region: Secondary | ICD-10-CM | POA: Diagnosis not present

## 2022-03-19 DIAGNOSIS — M5030 Other cervical disc degeneration, unspecified cervical region: Secondary | ICD-10-CM | POA: Diagnosis not present

## 2022-03-19 DIAGNOSIS — Z993 Dependence on wheelchair: Secondary | ICD-10-CM | POA: Diagnosis not present

## 2022-03-19 DIAGNOSIS — E785 Hyperlipidemia, unspecified: Secondary | ICD-10-CM | POA: Diagnosis not present

## 2022-03-19 DIAGNOSIS — Z7902 Long term (current) use of antithrombotics/antiplatelets: Secondary | ICD-10-CM | POA: Diagnosis not present

## 2022-03-19 DIAGNOSIS — U071 COVID-19: Secondary | ICD-10-CM | POA: Diagnosis not present

## 2022-03-19 DIAGNOSIS — G8929 Other chronic pain: Secondary | ICD-10-CM | POA: Diagnosis not present

## 2022-03-19 DIAGNOSIS — R7303 Prediabetes: Secondary | ICD-10-CM | POA: Diagnosis not present

## 2022-03-19 DIAGNOSIS — I69320 Aphasia following cerebral infarction: Secondary | ICD-10-CM | POA: Diagnosis not present

## 2022-03-19 DIAGNOSIS — M1990 Unspecified osteoarthritis, unspecified site: Secondary | ICD-10-CM | POA: Diagnosis not present

## 2022-03-19 NOTE — Telephone Encounter (Signed)
New Orders -   1 time a week for 1 week and 2 times a week for 7 weeks for aphasia an dysphagia

## 2022-03-20 ENCOUNTER — Telehealth: Payer: Self-pay | Admitting: Family Medicine

## 2022-03-20 DIAGNOSIS — G8929 Other chronic pain: Secondary | ICD-10-CM | POA: Diagnosis not present

## 2022-03-20 DIAGNOSIS — I69392 Facial weakness following cerebral infarction: Secondary | ICD-10-CM | POA: Diagnosis not present

## 2022-03-20 DIAGNOSIS — H409 Unspecified glaucoma: Secondary | ICD-10-CM | POA: Diagnosis not present

## 2022-03-20 DIAGNOSIS — M5412 Radiculopathy, cervical region: Secondary | ICD-10-CM | POA: Diagnosis not present

## 2022-03-20 DIAGNOSIS — Z7902 Long term (current) use of antithrombotics/antiplatelets: Secondary | ICD-10-CM | POA: Diagnosis not present

## 2022-03-20 DIAGNOSIS — E785 Hyperlipidemia, unspecified: Secondary | ICD-10-CM | POA: Diagnosis not present

## 2022-03-20 DIAGNOSIS — I351 Nonrheumatic aortic (valve) insufficiency: Secondary | ICD-10-CM | POA: Diagnosis not present

## 2022-03-20 DIAGNOSIS — I69351 Hemiplegia and hemiparesis following cerebral infarction affecting right dominant side: Secondary | ICD-10-CM | POA: Diagnosis not present

## 2022-03-20 DIAGNOSIS — E43 Unspecified severe protein-calorie malnutrition: Secondary | ICD-10-CM | POA: Diagnosis not present

## 2022-03-20 DIAGNOSIS — M1990 Unspecified osteoarthritis, unspecified site: Secondary | ICD-10-CM | POA: Diagnosis not present

## 2022-03-20 DIAGNOSIS — Z9181 History of falling: Secondary | ICD-10-CM | POA: Diagnosis not present

## 2022-03-20 DIAGNOSIS — R1310 Dysphagia, unspecified: Secondary | ICD-10-CM | POA: Diagnosis not present

## 2022-03-20 DIAGNOSIS — I700 Atherosclerosis of aorta: Secondary | ICD-10-CM | POA: Diagnosis not present

## 2022-03-20 DIAGNOSIS — I69391 Dysphagia following cerebral infarction: Secondary | ICD-10-CM | POA: Diagnosis not present

## 2022-03-20 DIAGNOSIS — I10 Essential (primary) hypertension: Secondary | ICD-10-CM | POA: Diagnosis not present

## 2022-03-20 DIAGNOSIS — R7303 Prediabetes: Secondary | ICD-10-CM | POA: Diagnosis not present

## 2022-03-20 DIAGNOSIS — Z981 Arthrodesis status: Secondary | ICD-10-CM | POA: Diagnosis not present

## 2022-03-20 DIAGNOSIS — Z7982 Long term (current) use of aspirin: Secondary | ICD-10-CM | POA: Diagnosis not present

## 2022-03-20 DIAGNOSIS — Z993 Dependence on wheelchair: Secondary | ICD-10-CM | POA: Diagnosis not present

## 2022-03-20 DIAGNOSIS — U071 COVID-19: Secondary | ICD-10-CM | POA: Diagnosis not present

## 2022-03-20 DIAGNOSIS — M5030 Other cervical disc degeneration, unspecified cervical region: Secondary | ICD-10-CM | POA: Diagnosis not present

## 2022-03-20 DIAGNOSIS — I69320 Aphasia following cerebral infarction: Secondary | ICD-10-CM | POA: Diagnosis not present

## 2022-03-20 DIAGNOSIS — Z9049 Acquired absence of other specified parts of digestive tract: Secondary | ICD-10-CM | POA: Diagnosis not present

## 2022-03-20 NOTE — Telephone Encounter (Signed)
LMOVM for [place] ST w/ [facility] VO for frequency for ST

## 2022-03-21 NOTE — Telephone Encounter (Signed)
LMOVM for [place] OT w/ [facility], VO given. Needs order for OT for 2 week 3 and one week 5. [place] aide 2 week 4, one week 3

## 2022-03-25 DIAGNOSIS — I69392 Facial weakness following cerebral infarction: Secondary | ICD-10-CM | POA: Diagnosis not present

## 2022-03-25 DIAGNOSIS — Z7902 Long term (current) use of antithrombotics/antiplatelets: Secondary | ICD-10-CM | POA: Diagnosis not present

## 2022-03-25 DIAGNOSIS — M5030 Other cervical disc degeneration, unspecified cervical region: Secondary | ICD-10-CM | POA: Diagnosis not present

## 2022-03-25 DIAGNOSIS — I69351 Hemiplegia and hemiparesis following cerebral infarction affecting right dominant side: Secondary | ICD-10-CM | POA: Diagnosis not present

## 2022-03-25 DIAGNOSIS — Z993 Dependence on wheelchair: Secondary | ICD-10-CM | POA: Diagnosis not present

## 2022-03-25 DIAGNOSIS — E43 Unspecified severe protein-calorie malnutrition: Secondary | ICD-10-CM | POA: Diagnosis not present

## 2022-03-25 DIAGNOSIS — E785 Hyperlipidemia, unspecified: Secondary | ICD-10-CM | POA: Diagnosis not present

## 2022-03-25 DIAGNOSIS — Z981 Arthrodesis status: Secondary | ICD-10-CM | POA: Diagnosis not present

## 2022-03-25 DIAGNOSIS — I69391 Dysphagia following cerebral infarction: Secondary | ICD-10-CM | POA: Diagnosis not present

## 2022-03-25 DIAGNOSIS — M1990 Unspecified osteoarthritis, unspecified site: Secondary | ICD-10-CM | POA: Diagnosis not present

## 2022-03-25 DIAGNOSIS — R7303 Prediabetes: Secondary | ICD-10-CM | POA: Diagnosis not present

## 2022-03-25 DIAGNOSIS — Z9049 Acquired absence of other specified parts of digestive tract: Secondary | ICD-10-CM | POA: Diagnosis not present

## 2022-03-25 DIAGNOSIS — I700 Atherosclerosis of aorta: Secondary | ICD-10-CM | POA: Diagnosis not present

## 2022-03-25 DIAGNOSIS — Z9181 History of falling: Secondary | ICD-10-CM | POA: Diagnosis not present

## 2022-03-25 DIAGNOSIS — M5412 Radiculopathy, cervical region: Secondary | ICD-10-CM | POA: Diagnosis not present

## 2022-03-25 DIAGNOSIS — I351 Nonrheumatic aortic (valve) insufficiency: Secondary | ICD-10-CM | POA: Diagnosis not present

## 2022-03-25 DIAGNOSIS — G8929 Other chronic pain: Secondary | ICD-10-CM | POA: Diagnosis not present

## 2022-03-25 DIAGNOSIS — I10 Essential (primary) hypertension: Secondary | ICD-10-CM | POA: Diagnosis not present

## 2022-03-25 DIAGNOSIS — H409 Unspecified glaucoma: Secondary | ICD-10-CM | POA: Diagnosis not present

## 2022-03-25 DIAGNOSIS — R1310 Dysphagia, unspecified: Secondary | ICD-10-CM | POA: Diagnosis not present

## 2022-03-25 DIAGNOSIS — U071 COVID-19: Secondary | ICD-10-CM | POA: Diagnosis not present

## 2022-03-25 DIAGNOSIS — I69320 Aphasia following cerebral infarction: Secondary | ICD-10-CM | POA: Diagnosis not present

## 2022-03-25 DIAGNOSIS — Z7982 Long term (current) use of aspirin: Secondary | ICD-10-CM | POA: Diagnosis not present

## 2022-03-26 DIAGNOSIS — I351 Nonrheumatic aortic (valve) insufficiency: Secondary | ICD-10-CM | POA: Diagnosis not present

## 2022-03-26 DIAGNOSIS — Z7982 Long term (current) use of aspirin: Secondary | ICD-10-CM | POA: Diagnosis not present

## 2022-03-26 DIAGNOSIS — I69391 Dysphagia following cerebral infarction: Secondary | ICD-10-CM | POA: Diagnosis not present

## 2022-03-26 DIAGNOSIS — Z9181 History of falling: Secondary | ICD-10-CM | POA: Diagnosis not present

## 2022-03-26 DIAGNOSIS — U071 COVID-19: Secondary | ICD-10-CM | POA: Diagnosis not present

## 2022-03-26 DIAGNOSIS — Z7902 Long term (current) use of antithrombotics/antiplatelets: Secondary | ICD-10-CM | POA: Diagnosis not present

## 2022-03-26 DIAGNOSIS — I10 Essential (primary) hypertension: Secondary | ICD-10-CM | POA: Diagnosis not present

## 2022-03-26 DIAGNOSIS — R7303 Prediabetes: Secondary | ICD-10-CM | POA: Diagnosis not present

## 2022-03-26 DIAGNOSIS — I700 Atherosclerosis of aorta: Secondary | ICD-10-CM | POA: Diagnosis not present

## 2022-03-26 DIAGNOSIS — M5030 Other cervical disc degeneration, unspecified cervical region: Secondary | ICD-10-CM | POA: Diagnosis not present

## 2022-03-26 DIAGNOSIS — H409 Unspecified glaucoma: Secondary | ICD-10-CM | POA: Diagnosis not present

## 2022-03-26 DIAGNOSIS — Z981 Arthrodesis status: Secondary | ICD-10-CM | POA: Diagnosis not present

## 2022-03-26 DIAGNOSIS — G8929 Other chronic pain: Secondary | ICD-10-CM | POA: Diagnosis not present

## 2022-03-26 DIAGNOSIS — M1990 Unspecified osteoarthritis, unspecified site: Secondary | ICD-10-CM | POA: Diagnosis not present

## 2022-03-26 DIAGNOSIS — R1310 Dysphagia, unspecified: Secondary | ICD-10-CM | POA: Diagnosis not present

## 2022-03-26 DIAGNOSIS — E43 Unspecified severe protein-calorie malnutrition: Secondary | ICD-10-CM | POA: Diagnosis not present

## 2022-03-26 DIAGNOSIS — I69320 Aphasia following cerebral infarction: Secondary | ICD-10-CM | POA: Diagnosis not present

## 2022-03-26 DIAGNOSIS — M5412 Radiculopathy, cervical region: Secondary | ICD-10-CM | POA: Diagnosis not present

## 2022-03-26 DIAGNOSIS — E785 Hyperlipidemia, unspecified: Secondary | ICD-10-CM | POA: Diagnosis not present

## 2022-03-26 DIAGNOSIS — Z9049 Acquired absence of other specified parts of digestive tract: Secondary | ICD-10-CM | POA: Diagnosis not present

## 2022-03-26 DIAGNOSIS — Z993 Dependence on wheelchair: Secondary | ICD-10-CM | POA: Diagnosis not present

## 2022-03-26 DIAGNOSIS — I69392 Facial weakness following cerebral infarction: Secondary | ICD-10-CM | POA: Diagnosis not present

## 2022-03-26 DIAGNOSIS — I69351 Hemiplegia and hemiparesis following cerebral infarction affecting right dominant side: Secondary | ICD-10-CM | POA: Diagnosis not present

## 2022-03-27 ENCOUNTER — Encounter: Payer: Medicare Other | Attending: Registered Nurse | Admitting: Registered Nurse

## 2022-03-28 DIAGNOSIS — E785 Hyperlipidemia, unspecified: Secondary | ICD-10-CM | POA: Diagnosis not present

## 2022-03-28 DIAGNOSIS — Z993 Dependence on wheelchair: Secondary | ICD-10-CM | POA: Diagnosis not present

## 2022-03-28 DIAGNOSIS — I69320 Aphasia following cerebral infarction: Secondary | ICD-10-CM | POA: Diagnosis not present

## 2022-03-28 DIAGNOSIS — I69351 Hemiplegia and hemiparesis following cerebral infarction affecting right dominant side: Secondary | ICD-10-CM | POA: Diagnosis not present

## 2022-03-28 DIAGNOSIS — E43 Unspecified severe protein-calorie malnutrition: Secondary | ICD-10-CM | POA: Diagnosis not present

## 2022-03-28 DIAGNOSIS — G8929 Other chronic pain: Secondary | ICD-10-CM | POA: Diagnosis not present

## 2022-03-28 DIAGNOSIS — I69391 Dysphagia following cerebral infarction: Secondary | ICD-10-CM | POA: Diagnosis not present

## 2022-03-28 DIAGNOSIS — M5412 Radiculopathy, cervical region: Secondary | ICD-10-CM | POA: Diagnosis not present

## 2022-03-28 DIAGNOSIS — I351 Nonrheumatic aortic (valve) insufficiency: Secondary | ICD-10-CM | POA: Diagnosis not present

## 2022-03-28 DIAGNOSIS — Z7982 Long term (current) use of aspirin: Secondary | ICD-10-CM | POA: Diagnosis not present

## 2022-03-28 DIAGNOSIS — Z7902 Long term (current) use of antithrombotics/antiplatelets: Secondary | ICD-10-CM | POA: Diagnosis not present

## 2022-03-28 DIAGNOSIS — U071 COVID-19: Secondary | ICD-10-CM | POA: Diagnosis not present

## 2022-03-28 DIAGNOSIS — I69392 Facial weakness following cerebral infarction: Secondary | ICD-10-CM | POA: Diagnosis not present

## 2022-03-28 DIAGNOSIS — Z981 Arthrodesis status: Secondary | ICD-10-CM | POA: Diagnosis not present

## 2022-03-28 DIAGNOSIS — M1990 Unspecified osteoarthritis, unspecified site: Secondary | ICD-10-CM | POA: Diagnosis not present

## 2022-03-28 DIAGNOSIS — Z9181 History of falling: Secondary | ICD-10-CM | POA: Diagnosis not present

## 2022-03-28 DIAGNOSIS — H409 Unspecified glaucoma: Secondary | ICD-10-CM | POA: Diagnosis not present

## 2022-03-28 DIAGNOSIS — R7303 Prediabetes: Secondary | ICD-10-CM | POA: Diagnosis not present

## 2022-03-28 DIAGNOSIS — I700 Atherosclerosis of aorta: Secondary | ICD-10-CM | POA: Diagnosis not present

## 2022-03-28 DIAGNOSIS — R1310 Dysphagia, unspecified: Secondary | ICD-10-CM | POA: Diagnosis not present

## 2022-03-28 DIAGNOSIS — I10 Essential (primary) hypertension: Secondary | ICD-10-CM | POA: Diagnosis not present

## 2022-03-28 DIAGNOSIS — Z9049 Acquired absence of other specified parts of digestive tract: Secondary | ICD-10-CM | POA: Diagnosis not present

## 2022-03-28 DIAGNOSIS — M5030 Other cervical disc degeneration, unspecified cervical region: Secondary | ICD-10-CM | POA: Diagnosis not present

## 2022-03-31 ENCOUNTER — Other Ambulatory Visit: Payer: Self-pay | Admitting: *Deleted

## 2022-03-31 DIAGNOSIS — Z981 Arthrodesis status: Secondary | ICD-10-CM | POA: Diagnosis not present

## 2022-03-31 DIAGNOSIS — I69391 Dysphagia following cerebral infarction: Secondary | ICD-10-CM | POA: Diagnosis not present

## 2022-03-31 DIAGNOSIS — Z9049 Acquired absence of other specified parts of digestive tract: Secondary | ICD-10-CM | POA: Diagnosis not present

## 2022-03-31 DIAGNOSIS — I69320 Aphasia following cerebral infarction: Secondary | ICD-10-CM | POA: Diagnosis not present

## 2022-03-31 DIAGNOSIS — Z7982 Long term (current) use of aspirin: Secondary | ICD-10-CM | POA: Diagnosis not present

## 2022-03-31 DIAGNOSIS — M5030 Other cervical disc degeneration, unspecified cervical region: Secondary | ICD-10-CM | POA: Diagnosis not present

## 2022-03-31 DIAGNOSIS — I700 Atherosclerosis of aorta: Secondary | ICD-10-CM | POA: Diagnosis not present

## 2022-03-31 DIAGNOSIS — I351 Nonrheumatic aortic (valve) insufficiency: Secondary | ICD-10-CM | POA: Diagnosis not present

## 2022-03-31 DIAGNOSIS — Z9181 History of falling: Secondary | ICD-10-CM | POA: Diagnosis not present

## 2022-03-31 DIAGNOSIS — G8929 Other chronic pain: Secondary | ICD-10-CM | POA: Diagnosis not present

## 2022-03-31 DIAGNOSIS — I10 Essential (primary) hypertension: Secondary | ICD-10-CM | POA: Diagnosis not present

## 2022-03-31 DIAGNOSIS — R7303 Prediabetes: Secondary | ICD-10-CM | POA: Diagnosis not present

## 2022-03-31 DIAGNOSIS — Z8673 Personal history of transient ischemic attack (TIA), and cerebral infarction without residual deficits: Secondary | ICD-10-CM

## 2022-03-31 DIAGNOSIS — E43 Unspecified severe protein-calorie malnutrition: Secondary | ICD-10-CM | POA: Diagnosis not present

## 2022-03-31 DIAGNOSIS — Z993 Dependence on wheelchair: Secondary | ICD-10-CM | POA: Diagnosis not present

## 2022-03-31 DIAGNOSIS — R1310 Dysphagia, unspecified: Secondary | ICD-10-CM | POA: Diagnosis not present

## 2022-03-31 DIAGNOSIS — U071 COVID-19: Secondary | ICD-10-CM | POA: Diagnosis not present

## 2022-03-31 DIAGNOSIS — Z7902 Long term (current) use of antithrombotics/antiplatelets: Secondary | ICD-10-CM | POA: Diagnosis not present

## 2022-03-31 DIAGNOSIS — M1990 Unspecified osteoarthritis, unspecified site: Secondary | ICD-10-CM | POA: Diagnosis not present

## 2022-03-31 DIAGNOSIS — E785 Hyperlipidemia, unspecified: Secondary | ICD-10-CM | POA: Diagnosis not present

## 2022-03-31 DIAGNOSIS — M5412 Radiculopathy, cervical region: Secondary | ICD-10-CM | POA: Diagnosis not present

## 2022-03-31 DIAGNOSIS — H409 Unspecified glaucoma: Secondary | ICD-10-CM | POA: Diagnosis not present

## 2022-03-31 DIAGNOSIS — I69392 Facial weakness following cerebral infarction: Secondary | ICD-10-CM | POA: Diagnosis not present

## 2022-03-31 DIAGNOSIS — I69351 Hemiplegia and hemiparesis following cerebral infarction affecting right dominant side: Secondary | ICD-10-CM | POA: Diagnosis not present

## 2022-03-31 MED ORDER — METOPROLOL TARTRATE 25 MG PO TABS: 12.5000 mg | ORAL_TABLET | Freq: Two times a day (BID) | ORAL | 0 refills | Status: DC

## 2022-03-31 MED ORDER — CLOPIDOGREL BISULFATE 75 MG PO TABS: 75.0000 mg | ORAL_TABLET | Freq: Every day | ORAL | 0 refills | Status: DC

## 2022-03-31 MED ORDER — FAMOTIDINE 20 MG PO TABS: 20.0000 mg | ORAL_TABLET | Freq: Two times a day (BID) | ORAL | 0 refills | Status: DC

## 2022-03-31 NOTE — Telephone Encounter (Signed)
TC from [facility] Pt requesting refills,meds have not been filled at pharmacy before Clopidogrel, Metoprolol & Famotidine Last OV 03/18/22 Rxd by Reesa Chew, PAC at hospital Next OV 04/16/22 Please advise

## 2022-04-02 ENCOUNTER — Ambulatory Visit (INDEPENDENT_AMBULATORY_CARE_PROVIDER_SITE_OTHER): Payer: Medicare Other

## 2022-04-02 DIAGNOSIS — U071 COVID-19: Secondary | ICD-10-CM

## 2022-04-02 DIAGNOSIS — Z993 Dependence on wheelchair: Secondary | ICD-10-CM | POA: Diagnosis not present

## 2022-04-02 DIAGNOSIS — I69351 Hemiplegia and hemiparesis following cerebral infarction affecting right dominant side: Secondary | ICD-10-CM

## 2022-04-02 DIAGNOSIS — R1310 Dysphagia, unspecified: Secondary | ICD-10-CM

## 2022-04-02 DIAGNOSIS — I69392 Facial weakness following cerebral infarction: Secondary | ICD-10-CM | POA: Diagnosis not present

## 2022-04-02 DIAGNOSIS — R7303 Prediabetes: Secondary | ICD-10-CM | POA: Diagnosis not present

## 2022-04-02 DIAGNOSIS — H409 Unspecified glaucoma: Secondary | ICD-10-CM | POA: Diagnosis not present

## 2022-04-02 DIAGNOSIS — I10 Essential (primary) hypertension: Secondary | ICD-10-CM | POA: Diagnosis not present

## 2022-04-02 DIAGNOSIS — I351 Nonrheumatic aortic (valve) insufficiency: Secondary | ICD-10-CM

## 2022-04-02 DIAGNOSIS — Z9049 Acquired absence of other specified parts of digestive tract: Secondary | ICD-10-CM | POA: Diagnosis not present

## 2022-04-02 DIAGNOSIS — I700 Atherosclerosis of aorta: Secondary | ICD-10-CM | POA: Diagnosis not present

## 2022-04-02 DIAGNOSIS — I69391 Dysphagia following cerebral infarction: Secondary | ICD-10-CM

## 2022-04-02 DIAGNOSIS — M1990 Unspecified osteoarthritis, unspecified site: Secondary | ICD-10-CM

## 2022-04-02 DIAGNOSIS — G8929 Other chronic pain: Secondary | ICD-10-CM | POA: Diagnosis not present

## 2022-04-02 DIAGNOSIS — I69320 Aphasia following cerebral infarction: Secondary | ICD-10-CM

## 2022-04-02 DIAGNOSIS — Z7902 Long term (current) use of antithrombotics/antiplatelets: Secondary | ICD-10-CM | POA: Diagnosis not present

## 2022-04-02 DIAGNOSIS — Z9181 History of falling: Secondary | ICD-10-CM | POA: Diagnosis not present

## 2022-04-02 DIAGNOSIS — M5030 Other cervical disc degeneration, unspecified cervical region: Secondary | ICD-10-CM

## 2022-04-02 DIAGNOSIS — Z7982 Long term (current) use of aspirin: Secondary | ICD-10-CM | POA: Diagnosis not present

## 2022-04-02 DIAGNOSIS — Z981 Arthrodesis status: Secondary | ICD-10-CM | POA: Diagnosis not present

## 2022-04-02 DIAGNOSIS — E785 Hyperlipidemia, unspecified: Secondary | ICD-10-CM | POA: Diagnosis not present

## 2022-04-02 DIAGNOSIS — E43 Unspecified severe protein-calorie malnutrition: Secondary | ICD-10-CM | POA: Diagnosis not present

## 2022-04-02 DIAGNOSIS — M5412 Radiculopathy, cervical region: Secondary | ICD-10-CM

## 2022-04-03 ENCOUNTER — Telehealth: Payer: Self-pay | Admitting: Family Medicine

## 2022-04-03 NOTE — Telephone Encounter (Signed)
Pts son called stating that he needs PCP to write a letter stating that pt is not able to handle his banking affairs due to his health conditions so that he can send it to the social services office so that they will start sending pts direct deposit to the new account he has created for pt.   Please advise.

## 2022-04-04 NOTE — Telephone Encounter (Signed)
TC to son, SSA 74 Statement of pt's capability to manage benefits form (form SSA-787) has been faxed to the [facility] office at [phone] and he can pick up the form as well.

## 2022-04-07 DIAGNOSIS — I69320 Aphasia following cerebral infarction: Secondary | ICD-10-CM | POA: Diagnosis not present

## 2022-04-07 DIAGNOSIS — M5030 Other cervical disc degeneration, unspecified cervical region: Secondary | ICD-10-CM | POA: Diagnosis not present

## 2022-04-07 DIAGNOSIS — U071 COVID-19: Secondary | ICD-10-CM | POA: Diagnosis not present

## 2022-04-07 DIAGNOSIS — Z9049 Acquired absence of other specified parts of digestive tract: Secondary | ICD-10-CM | POA: Diagnosis not present

## 2022-04-07 DIAGNOSIS — G8929 Other chronic pain: Secondary | ICD-10-CM | POA: Diagnosis not present

## 2022-04-07 DIAGNOSIS — M1990 Unspecified osteoarthritis, unspecified site: Secondary | ICD-10-CM | POA: Diagnosis not present

## 2022-04-07 DIAGNOSIS — M5412 Radiculopathy, cervical region: Secondary | ICD-10-CM | POA: Diagnosis not present

## 2022-04-07 DIAGNOSIS — I700 Atherosclerosis of aorta: Secondary | ICD-10-CM | POA: Diagnosis not present

## 2022-04-07 DIAGNOSIS — Z9181 History of falling: Secondary | ICD-10-CM | POA: Diagnosis not present

## 2022-04-07 DIAGNOSIS — Z7982 Long term (current) use of aspirin: Secondary | ICD-10-CM | POA: Diagnosis not present

## 2022-04-07 DIAGNOSIS — I351 Nonrheumatic aortic (valve) insufficiency: Secondary | ICD-10-CM | POA: Diagnosis not present

## 2022-04-07 DIAGNOSIS — Z981 Arthrodesis status: Secondary | ICD-10-CM | POA: Diagnosis not present

## 2022-04-07 DIAGNOSIS — I69392 Facial weakness following cerebral infarction: Secondary | ICD-10-CM | POA: Diagnosis not present

## 2022-04-07 DIAGNOSIS — I69351 Hemiplegia and hemiparesis following cerebral infarction affecting right dominant side: Secondary | ICD-10-CM | POA: Diagnosis not present

## 2022-04-07 DIAGNOSIS — E43 Unspecified severe protein-calorie malnutrition: Secondary | ICD-10-CM | POA: Diagnosis not present

## 2022-04-07 DIAGNOSIS — I69391 Dysphagia following cerebral infarction: Secondary | ICD-10-CM | POA: Diagnosis not present

## 2022-04-07 DIAGNOSIS — E785 Hyperlipidemia, unspecified: Secondary | ICD-10-CM | POA: Diagnosis not present

## 2022-04-07 DIAGNOSIS — Z993 Dependence on wheelchair: Secondary | ICD-10-CM | POA: Diagnosis not present

## 2022-04-07 DIAGNOSIS — R1310 Dysphagia, unspecified: Secondary | ICD-10-CM | POA: Diagnosis not present

## 2022-04-07 DIAGNOSIS — Z7902 Long term (current) use of antithrombotics/antiplatelets: Secondary | ICD-10-CM | POA: Diagnosis not present

## 2022-04-07 DIAGNOSIS — I10 Essential (primary) hypertension: Secondary | ICD-10-CM | POA: Diagnosis not present

## 2022-04-07 DIAGNOSIS — R7303 Prediabetes: Secondary | ICD-10-CM | POA: Diagnosis not present

## 2022-04-07 DIAGNOSIS — H409 Unspecified glaucoma: Secondary | ICD-10-CM | POA: Diagnosis not present

## 2022-04-09 DIAGNOSIS — H409 Unspecified glaucoma: Secondary | ICD-10-CM | POA: Diagnosis not present

## 2022-04-09 DIAGNOSIS — I69392 Facial weakness following cerebral infarction: Secondary | ICD-10-CM | POA: Diagnosis not present

## 2022-04-09 DIAGNOSIS — M1990 Unspecified osteoarthritis, unspecified site: Secondary | ICD-10-CM | POA: Diagnosis not present

## 2022-04-09 DIAGNOSIS — Z9049 Acquired absence of other specified parts of digestive tract: Secondary | ICD-10-CM | POA: Diagnosis not present

## 2022-04-09 DIAGNOSIS — M5412 Radiculopathy, cervical region: Secondary | ICD-10-CM | POA: Diagnosis not present

## 2022-04-09 DIAGNOSIS — I69351 Hemiplegia and hemiparesis following cerebral infarction affecting right dominant side: Secondary | ICD-10-CM | POA: Diagnosis not present

## 2022-04-09 DIAGNOSIS — R1310 Dysphagia, unspecified: Secondary | ICD-10-CM | POA: Diagnosis not present

## 2022-04-09 DIAGNOSIS — Z7902 Long term (current) use of antithrombotics/antiplatelets: Secondary | ICD-10-CM | POA: Diagnosis not present

## 2022-04-09 DIAGNOSIS — E785 Hyperlipidemia, unspecified: Secondary | ICD-10-CM | POA: Diagnosis not present

## 2022-04-09 DIAGNOSIS — Z981 Arthrodesis status: Secondary | ICD-10-CM | POA: Diagnosis not present

## 2022-04-09 DIAGNOSIS — U071 COVID-19: Secondary | ICD-10-CM | POA: Diagnosis not present

## 2022-04-09 DIAGNOSIS — I10 Essential (primary) hypertension: Secondary | ICD-10-CM | POA: Diagnosis not present

## 2022-04-09 DIAGNOSIS — Z7982 Long term (current) use of aspirin: Secondary | ICD-10-CM | POA: Diagnosis not present

## 2022-04-09 DIAGNOSIS — I700 Atherosclerosis of aorta: Secondary | ICD-10-CM | POA: Diagnosis not present

## 2022-04-09 DIAGNOSIS — I69391 Dysphagia following cerebral infarction: Secondary | ICD-10-CM | POA: Diagnosis not present

## 2022-04-09 DIAGNOSIS — I69320 Aphasia following cerebral infarction: Secondary | ICD-10-CM | POA: Diagnosis not present

## 2022-04-09 DIAGNOSIS — G8929 Other chronic pain: Secondary | ICD-10-CM | POA: Diagnosis not present

## 2022-04-09 DIAGNOSIS — I351 Nonrheumatic aortic (valve) insufficiency: Secondary | ICD-10-CM | POA: Diagnosis not present

## 2022-04-09 DIAGNOSIS — Z9181 History of falling: Secondary | ICD-10-CM | POA: Diagnosis not present

## 2022-04-09 DIAGNOSIS — Z993 Dependence on wheelchair: Secondary | ICD-10-CM | POA: Diagnosis not present

## 2022-04-09 DIAGNOSIS — R7303 Prediabetes: Secondary | ICD-10-CM | POA: Diagnosis not present

## 2022-04-09 DIAGNOSIS — M5030 Other cervical disc degeneration, unspecified cervical region: Secondary | ICD-10-CM | POA: Diagnosis not present

## 2022-04-09 DIAGNOSIS — E43 Unspecified severe protein-calorie malnutrition: Secondary | ICD-10-CM | POA: Diagnosis not present

## 2022-04-09 DIAGNOSIS — I63512 Cerebral infarction due to unspecified occlusion or stenosis of left middle cerebral artery: Secondary | ICD-10-CM | POA: Diagnosis not present

## 2022-04-10 ENCOUNTER — Telehealth: Payer: Self-pay | Admitting: *Deleted

## 2022-04-10 ENCOUNTER — Telehealth: Payer: Self-pay | Admitting: Family Medicine

## 2022-04-10 DIAGNOSIS — Z993 Dependence on wheelchair: Secondary | ICD-10-CM | POA: Diagnosis not present

## 2022-04-10 DIAGNOSIS — I700 Atherosclerosis of aorta: Secondary | ICD-10-CM | POA: Diagnosis not present

## 2022-04-10 DIAGNOSIS — I69392 Facial weakness following cerebral infarction: Secondary | ICD-10-CM | POA: Diagnosis not present

## 2022-04-10 DIAGNOSIS — I69351 Hemiplegia and hemiparesis following cerebral infarction affecting right dominant side: Secondary | ICD-10-CM | POA: Diagnosis not present

## 2022-04-10 DIAGNOSIS — Z7982 Long term (current) use of aspirin: Secondary | ICD-10-CM | POA: Diagnosis not present

## 2022-04-10 DIAGNOSIS — R1310 Dysphagia, unspecified: Secondary | ICD-10-CM | POA: Diagnosis not present

## 2022-04-10 DIAGNOSIS — M1990 Unspecified osteoarthritis, unspecified site: Secondary | ICD-10-CM | POA: Diagnosis not present

## 2022-04-10 DIAGNOSIS — I10 Essential (primary) hypertension: Secondary | ICD-10-CM | POA: Diagnosis not present

## 2022-04-10 DIAGNOSIS — E785 Hyperlipidemia, unspecified: Secondary | ICD-10-CM | POA: Diagnosis not present

## 2022-04-10 DIAGNOSIS — Z981 Arthrodesis status: Secondary | ICD-10-CM | POA: Diagnosis not present

## 2022-04-10 DIAGNOSIS — M5030 Other cervical disc degeneration, unspecified cervical region: Secondary | ICD-10-CM | POA: Diagnosis not present

## 2022-04-10 DIAGNOSIS — E43 Unspecified severe protein-calorie malnutrition: Secondary | ICD-10-CM | POA: Diagnosis not present

## 2022-04-10 DIAGNOSIS — I69320 Aphasia following cerebral infarction: Secondary | ICD-10-CM | POA: Diagnosis not present

## 2022-04-10 DIAGNOSIS — I351 Nonrheumatic aortic (valve) insufficiency: Secondary | ICD-10-CM | POA: Diagnosis not present

## 2022-04-10 DIAGNOSIS — R7303 Prediabetes: Secondary | ICD-10-CM | POA: Diagnosis not present

## 2022-04-10 DIAGNOSIS — H409 Unspecified glaucoma: Secondary | ICD-10-CM | POA: Diagnosis not present

## 2022-04-10 DIAGNOSIS — I69391 Dysphagia following cerebral infarction: Secondary | ICD-10-CM | POA: Diagnosis not present

## 2022-04-10 DIAGNOSIS — M5412 Radiculopathy, cervical region: Secondary | ICD-10-CM | POA: Diagnosis not present

## 2022-04-10 DIAGNOSIS — Z9049 Acquired absence of other specified parts of digestive tract: Secondary | ICD-10-CM | POA: Diagnosis not present

## 2022-04-10 DIAGNOSIS — U071 COVID-19: Secondary | ICD-10-CM | POA: Diagnosis not present

## 2022-04-10 DIAGNOSIS — G8929 Other chronic pain: Secondary | ICD-10-CM | POA: Diagnosis not present

## 2022-04-10 DIAGNOSIS — Z9181 History of falling: Secondary | ICD-10-CM | POA: Diagnosis not present

## 2022-04-10 DIAGNOSIS — I63512 Cerebral infarction due to unspecified occlusion or stenosis of left middle cerebral artery: Secondary | ICD-10-CM | POA: Diagnosis not present

## 2022-04-10 DIAGNOSIS — Z7902 Long term (current) use of antithrombotics/antiplatelets: Secondary | ICD-10-CM | POA: Diagnosis not present

## 2022-04-10 NOTE — Telephone Encounter (Signed)
TC from [place] PTA w/ [facility] Seeing pt now, he is not eating, has not had a BM in 5 days, has a fever of 101, urine is dark brown. Video visit made for tomorrow w/ PCP, urine specimen cup left at front desk for family member to pick up.

## 2022-04-10 NOTE — Telephone Encounter (Signed)
Video visit made

## 2022-04-11 ENCOUNTER — Telehealth (INDEPENDENT_AMBULATORY_CARE_PROVIDER_SITE_OTHER): Payer: Medicare Other | Admitting: Family Medicine

## 2022-04-11 ENCOUNTER — Encounter: Payer: Self-pay | Admitting: Family Medicine

## 2022-04-11 DIAGNOSIS — R7303 Prediabetes: Secondary | ICD-10-CM | POA: Diagnosis not present

## 2022-04-11 DIAGNOSIS — R1310 Dysphagia, unspecified: Secondary | ICD-10-CM | POA: Diagnosis not present

## 2022-04-11 DIAGNOSIS — I69351 Hemiplegia and hemiparesis following cerebral infarction affecting right dominant side: Secondary | ICD-10-CM | POA: Diagnosis not present

## 2022-04-11 DIAGNOSIS — I69320 Aphasia following cerebral infarction: Secondary | ICD-10-CM | POA: Diagnosis not present

## 2022-04-11 DIAGNOSIS — Z981 Arthrodesis status: Secondary | ICD-10-CM | POA: Diagnosis not present

## 2022-04-11 DIAGNOSIS — I69392 Facial weakness following cerebral infarction: Secondary | ICD-10-CM | POA: Diagnosis not present

## 2022-04-11 DIAGNOSIS — M1990 Unspecified osteoarthritis, unspecified site: Secondary | ICD-10-CM | POA: Diagnosis not present

## 2022-04-11 DIAGNOSIS — N3001 Acute cystitis with hematuria: Secondary | ICD-10-CM | POA: Diagnosis not present

## 2022-04-11 DIAGNOSIS — Z993 Dependence on wheelchair: Secondary | ICD-10-CM | POA: Diagnosis not present

## 2022-04-11 DIAGNOSIS — Z7982 Long term (current) use of aspirin: Secondary | ICD-10-CM | POA: Diagnosis not present

## 2022-04-11 DIAGNOSIS — Z7902 Long term (current) use of antithrombotics/antiplatelets: Secondary | ICD-10-CM | POA: Diagnosis not present

## 2022-04-11 DIAGNOSIS — H409 Unspecified glaucoma: Secondary | ICD-10-CM | POA: Diagnosis not present

## 2022-04-11 DIAGNOSIS — E43 Unspecified severe protein-calorie malnutrition: Secondary | ICD-10-CM | POA: Diagnosis not present

## 2022-04-11 DIAGNOSIS — G8929 Other chronic pain: Secondary | ICD-10-CM | POA: Diagnosis not present

## 2022-04-11 DIAGNOSIS — Z9181 History of falling: Secondary | ICD-10-CM | POA: Diagnosis not present

## 2022-04-11 DIAGNOSIS — I69391 Dysphagia following cerebral infarction: Secondary | ICD-10-CM | POA: Diagnosis not present

## 2022-04-11 DIAGNOSIS — U071 COVID-19: Secondary | ICD-10-CM | POA: Diagnosis not present

## 2022-04-11 DIAGNOSIS — I10 Essential (primary) hypertension: Secondary | ICD-10-CM | POA: Diagnosis not present

## 2022-04-11 DIAGNOSIS — Z7401 Bed confinement status: Secondary | ICD-10-CM | POA: Insufficient documentation

## 2022-04-11 DIAGNOSIS — I700 Atherosclerosis of aorta: Secondary | ICD-10-CM | POA: Diagnosis not present

## 2022-04-11 DIAGNOSIS — Z9049 Acquired absence of other specified parts of digestive tract: Secondary | ICD-10-CM | POA: Diagnosis not present

## 2022-04-11 DIAGNOSIS — R300 Dysuria: Secondary | ICD-10-CM

## 2022-04-11 DIAGNOSIS — E785 Hyperlipidemia, unspecified: Secondary | ICD-10-CM | POA: Diagnosis not present

## 2022-04-11 DIAGNOSIS — I351 Nonrheumatic aortic (valve) insufficiency: Secondary | ICD-10-CM | POA: Diagnosis not present

## 2022-04-11 DIAGNOSIS — M5412 Radiculopathy, cervical region: Secondary | ICD-10-CM | POA: Diagnosis not present

## 2022-04-11 DIAGNOSIS — M5030 Other cervical disc degeneration, unspecified cervical region: Secondary | ICD-10-CM | POA: Diagnosis not present

## 2022-04-11 LAB — URINALYSIS, ROUTINE W REFLEX MICROSCOPIC
Bilirubin, UA: NEGATIVE
Glucose, UA: NEGATIVE
Nitrite, UA: POSITIVE — AB
Specific Gravity, UA: 1.025 (ref 1.005–1.030)
Urobilinogen, Ur: 2 mg/dL — ABNORMAL HIGH (ref 0.2–1.0)
pH, UA: 5.5 (ref 5.0–7.5)

## 2022-04-11 LAB — MICROSCOPIC EXAMINATION
Epithelial Cells (non renal): NONE SEEN /hpf (ref 0–10)
RBC, Urine: NONE SEEN /hpf (ref 0–2)
Renal Epithel, UA: NONE SEEN /hpf

## 2022-04-11 MED ORDER — SULFAMETHOXAZOLE-TRIMETHOPRIM 800-160 MG PO TABS: 1.0000 | ORAL_TABLET | Freq: Two times a day (BID) | ORAL | 0 refills | Status: AC

## 2022-04-11 NOTE — Progress Notes (Signed)
Virtual Visit via MyChart Video Note Due to COVID-19 pandemic this visit was conducted virtually. This visit type was conducted due to national recommendations for restrictions regarding the COVID-19 Pandemic (e.g. social distancing, sheltering in place) in an effort to limit this patient's exposure and mitigate transmission in our community. All issues noted in this document were discussed and addressed.  A physical exam was not performed with this format.   I connected with Paul Bradshaw on 04/11/2022 at 1120 by MyChart Video and verified that I am speaking with the correct person using two identifiers. Paul Bradshaw is currently located at home and family is currently with them during visit. The provider, Kari Baars, FNP is located in their office at time of visit.  I discussed the limitations, risks, security and privacy concerns of performing an evaluation and management service by virtual visit and the availability of in person appointments. I also discussed with the patient that there may be a patient responsible charge related to this service. The patient expressed understanding and agreed to proceed.  Subjective:  Patient ID: Paul Bradshaw, male    DOB: 07-05-1925, 88 y.o.   MRN: [id number]  Chief Complaint:  Urinary Tract Infection   HPI: Paul Bradshaw is a 88 y.o. male presenting on 04/11/2022 for Urinary Tract Infection   Son reports he feels father has an UTI again. States urine is foul and pt has had a low grade fever. Pt continues to decline since having stroke. He is bedridden and aphasic. Son reports he now has some swelling in his arms, denies erythema. He is completely dependent with all ADLs. Has to have thickened liquids. Son reports decreased oral intake over the last 3 days, last BM 5 days ago.      Relevant past medical, surgical, family, and social history reviewed and updated as indicated.  Allergies and medications reviewed and updated.   Past Medical  History:  Diagnosis Date   Aortic regurgitation    Moderate   Arthritis    BPH (benign prostatic hyperplasia)    Cervical disc disease    Cervical radiculopathy    Hyperlipidemia    Hypertension    Prediabetes 02/16/2021   Staphylococcus aureus bacteremia 08/09/2012   TEE negative for vegetation or thrombus February 2014   Stroke [facility]) 2005 or 2006   3   Symptomatic carotid artery stenosis with infarction [facility]) 2005 or 2006   Status post right carotid endarterectomy    Past Surgical History:  Procedure Laterality Date   BACK SURGERY     CAROTID ENDARTERECTOMY     CATARACT EXTRACTION W/PHACO Left 02/15/2015   Procedure: CATARACT EXTRACTION PHACO AND INTRAOCULAR LENS PLACEMENT LEFT EYE CDE=30.93;  Surgeon: Gemma Payor, MD;  Location: [facility];  Service: Ophthalmology;  Laterality: Left;   CHOLECYSTECTOMY     EYE SURGERY     KIDNEY STONE SURGERY     LUMBAR LAMINECTOMY/DECOMPRESSION MICRODISCECTOMY Bilateral 01/23/2014   Procedure: LUMBAR LAMINECTOMY/DECOMPRESSION MICRODISCECTOMY 1 LEVEL L5-S1;  Surgeon: Temple Pacini, MD;  Location: [facility];  Service: Neurosurgery;  Laterality: Bilateral;  LUMBAR LAMINECTOMY/DECOMPRESSION MICRODISCECTOMY 1 LEVEL L5-S1   TEE WITHOUT CARDIOVERSION N/A 08/12/2012   Procedure: TRANSESOPHAGEAL ECHOCARDIOGRAM (TEE);  Surgeon: Wendall Stade, MD;  Location: [facility];  Service: Cardiovascular;  Laterality: N/A;   TEE WITHOUT CARDIOVERSION N/A 06/24/2013   Procedure: TRANSESOPHAGEAL ECHOCARDIOGRAM (TEE);  Surgeon: Jonelle Sidle, MD;  Location: [facility];  Service: Endoscopy;  Laterality: N/A;  Social History   Socioeconomic History   Marital status: Married    Spouse name: Not on file   Number of children: Not on file   Years of education: Not on file   Highest education level: Not on file  Occupational History   Not on file  Tobacco Use   Smoking status: Never   Smokeless tobacco: Never  Vaping Use   Vaping Use: Never used   Substance and Sexual Activity   Alcohol use: No   Drug use: No   Sexual activity: Not Currently  Other Topics Concern   Not on file  Social History Narrative   Not on file   Social Determinants of Health   Financial Resource Strain: Not on file  Food Insecurity: Not on file  Transportation Needs: No Transportation Needs (03/14/2022)   PRAPARE - Transportation    Lack of Transportation (Medical): No    Lack of Transportation (Non-Medical): No  Physical Activity: Not on file  Stress: Not on file  Social Connections: Not on file  Intimate Partner Violence: Not on file    Outpatient Encounter Medications as of 04/11/2022  Medication Sig   sulfamethoxazole-trimethoprim (BACTRIM DS) 800-160 MG tablet Take 1 tablet by mouth 2 (two) times daily for 7 days.   acetaminophen (TYLENOL) 325 MG tablet Take 1-2 tablets (325-650 mg total) by mouth every 4 (four) hours as needed for mild pain.   albuterol (VENTOLIN HFA) 108 (90 Base) MCG/ACT inhaler Inhale 2 puffs into the lungs every 6 (six) hours as needed for shortness of breath or wheezing.   aspirin EC 81 MG tablet Take 1 tablet (81 mg total) by mouth daily with breakfast.   atorvastatin (LIPITOR) 10 MG tablet Take 1 tablet (10 mg total) by mouth daily.   brimonidine (ALPHAGAN) 0.2 % ophthalmic solution Place 1 drop into both eyes 3 (three) times daily.   clopidogrel (PLAVIX) 75 MG tablet Take 1 tablet (75 mg total) by mouth daily with breakfast.   dorzolamide-timolol (COSOPT) 22.3-6.8 MG/ML ophthalmic solution Place 1 drop into both eyes 2 (two) times daily.   famotidine (PEPCID) 20 MG tablet Take 1 tablet (20 mg total) by mouth 2 (two) times daily.   finasteride (PROSCAR) 5 MG tablet Take 1 tablet (5 mg total) by mouth daily.   latanoprost (XALATAN) 0.005 % ophthalmic solution Place 1 drop into both eyes every evening.   metoprolol tartrate (LOPRESSOR) 25 MG tablet Take 1/2 tablet (12.5 mg total) by mouth 2 (two) times daily.    Mouthwashes (MOUTH RINSE) LIQD solution 15 mLs by Mouth Rinse route 4 (four) times daily - after meals and at bedtime.   Multiple Vitamins-Minerals (MULTIVITAMIN PO) Take 1 tablet by mouth daily.   tamsulosin (FLOMAX) 0.4 MG CAPS capsule Take 0.4 mg by mouth daily.   Xanthan Gum GEL Take 1 packet (15 g total) by mouth as needed.   No facility-administered encounter medications on file as of 04/11/2022.    Allergies  Allergen Reactions   Amlodipine Swelling    Swelling of lips.   Lisinopril Other (See Comments)    Elevated BP higher & causes a burning feeling on the inside.     Review of Systems  Constitutional:  Positive for activity change, appetite change, fatigue and fever. Negative for diaphoresis and unexpected weight change.  Respiratory:  Negative for cough and shortness of breath.   Cardiovascular:  Positive for leg swelling. Negative for chest pain and palpitations.  Gastrointestinal:  Positive for constipation.  Genitourinary:  Positive for decreased urine volume and difficulty urinating.  Neurological:  Positive for speech difficulty and weakness.  Psychiatric/Behavioral:  Positive for confusion.          Observations/Objective: No vital signs or physical exam, this was a virtual health encounter.  Pt alert and oriented, answers all questions appropriately, and able to speak in full sentences.    Assessment and Plan: Kinsler was seen today for urinary tract infection.  Diagnoses and all orders for this visit:  Dysuria Acute cystitis with hematuria Urinalysis with 1+ leukocytes, + nitrites, 1+ protein, 1+ blood, and many bacteria. Culture pending. Bactrim as prescribed. Son aware of red flags which require emergent evaluation and treatment.  -     Urinalysis, Routine w reflex microscopic -     Urine Culture -     Microscopic Examination -     sulfamethoxazole-trimethoprim (BACTRIM DS) 800-160 MG tablet; Take 1 tablet by mouth 2 (two) times daily for 7  days.  Aphasia due to recent cerebral infarction Dysphagia due to recent stroke Protein-calorie malnutrition, severe Bedridden Pt status continues to decline. Will reorder home health for assistance with ADLs. Discussed hospice and palliative care today, son declines again but states he will continue to think about it.  Prognosis poor.  -     Ambulatory referral to Home Health     Follow Up Instructions: Return if symptoms worsen or fail to improve.    I discussed the assessment and treatment plan with the patient. The patient was provided an opportunity to ask questions and all were answered. The patient agreed with the plan and demonstrated an understanding of the instructions.   The patient was advised to call back or seek an in-person evaluation if the symptoms worsen or if the condition fails to improve as anticipated.  The above assessment and management plan was discussed with the patient. The patient verbalized understanding of and has agreed to the management plan. Patient is aware to call the clinic if they develop any new symptoms or if symptoms persist or worsen. Patient is aware when to return to the clinic for a follow-up visit. Patient educated on when it is appropriate to go to the emergency department.    I provided 30 minutes of time during this MyChart Video encounter.   Kari Baars, FNP-C [facility] [address] [place], [place] [address] [phone] 04/11/2022

## 2022-04-15 ENCOUNTER — Telehealth: Payer: Medicare Other | Admitting: Family Medicine

## 2022-04-15 DIAGNOSIS — G8929 Other chronic pain: Secondary | ICD-10-CM | POA: Diagnosis not present

## 2022-04-15 DIAGNOSIS — Z7982 Long term (current) use of aspirin: Secondary | ICD-10-CM | POA: Diagnosis not present

## 2022-04-15 DIAGNOSIS — E43 Unspecified severe protein-calorie malnutrition: Secondary | ICD-10-CM | POA: Diagnosis not present

## 2022-04-15 DIAGNOSIS — M5030 Other cervical disc degeneration, unspecified cervical region: Secondary | ICD-10-CM | POA: Diagnosis not present

## 2022-04-15 DIAGNOSIS — I69392 Facial weakness following cerebral infarction: Secondary | ICD-10-CM | POA: Diagnosis not present

## 2022-04-15 DIAGNOSIS — Z9049 Acquired absence of other specified parts of digestive tract: Secondary | ICD-10-CM | POA: Diagnosis not present

## 2022-04-15 DIAGNOSIS — Z981 Arthrodesis status: Secondary | ICD-10-CM | POA: Diagnosis not present

## 2022-04-15 DIAGNOSIS — I69351 Hemiplegia and hemiparesis following cerebral infarction affecting right dominant side: Secondary | ICD-10-CM | POA: Diagnosis not present

## 2022-04-15 DIAGNOSIS — R7303 Prediabetes: Secondary | ICD-10-CM | POA: Diagnosis not present

## 2022-04-15 DIAGNOSIS — Z7902 Long term (current) use of antithrombotics/antiplatelets: Secondary | ICD-10-CM | POA: Diagnosis not present

## 2022-04-15 DIAGNOSIS — I10 Essential (primary) hypertension: Secondary | ICD-10-CM | POA: Diagnosis not present

## 2022-04-15 DIAGNOSIS — Z993 Dependence on wheelchair: Secondary | ICD-10-CM | POA: Diagnosis not present

## 2022-04-15 DIAGNOSIS — I351 Nonrheumatic aortic (valve) insufficiency: Secondary | ICD-10-CM | POA: Diagnosis not present

## 2022-04-15 DIAGNOSIS — I69320 Aphasia following cerebral infarction: Secondary | ICD-10-CM | POA: Diagnosis not present

## 2022-04-15 DIAGNOSIS — Z9181 History of falling: Secondary | ICD-10-CM | POA: Diagnosis not present

## 2022-04-15 DIAGNOSIS — I700 Atherosclerosis of aorta: Secondary | ICD-10-CM | POA: Diagnosis not present

## 2022-04-15 DIAGNOSIS — I69391 Dysphagia following cerebral infarction: Secondary | ICD-10-CM | POA: Diagnosis not present

## 2022-04-15 DIAGNOSIS — R1310 Dysphagia, unspecified: Secondary | ICD-10-CM | POA: Diagnosis not present

## 2022-04-15 DIAGNOSIS — U071 COVID-19: Secondary | ICD-10-CM | POA: Diagnosis not present

## 2022-04-15 DIAGNOSIS — M1990 Unspecified osteoarthritis, unspecified site: Secondary | ICD-10-CM | POA: Diagnosis not present

## 2022-04-15 DIAGNOSIS — E785 Hyperlipidemia, unspecified: Secondary | ICD-10-CM | POA: Diagnosis not present

## 2022-04-15 DIAGNOSIS — H409 Unspecified glaucoma: Secondary | ICD-10-CM | POA: Diagnosis not present

## 2022-04-15 DIAGNOSIS — M5412 Radiculopathy, cervical region: Secondary | ICD-10-CM | POA: Diagnosis not present

## 2022-04-15 LAB — URINE CULTURE

## 2022-04-16 ENCOUNTER — Ambulatory Visit: Payer: Medicare Other | Admitting: Family Medicine

## 2022-04-16 DIAGNOSIS — M5412 Radiculopathy, cervical region: Secondary | ICD-10-CM | POA: Diagnosis not present

## 2022-04-16 DIAGNOSIS — I10 Essential (primary) hypertension: Secondary | ICD-10-CM | POA: Diagnosis not present

## 2022-04-16 DIAGNOSIS — Z7982 Long term (current) use of aspirin: Secondary | ICD-10-CM | POA: Diagnosis not present

## 2022-04-16 DIAGNOSIS — R1310 Dysphagia, unspecified: Secondary | ICD-10-CM | POA: Diagnosis not present

## 2022-04-16 DIAGNOSIS — E785 Hyperlipidemia, unspecified: Secondary | ICD-10-CM | POA: Diagnosis not present

## 2022-04-16 DIAGNOSIS — H409 Unspecified glaucoma: Secondary | ICD-10-CM | POA: Diagnosis not present

## 2022-04-16 DIAGNOSIS — M5030 Other cervical disc degeneration, unspecified cervical region: Secondary | ICD-10-CM | POA: Diagnosis not present

## 2022-04-16 DIAGNOSIS — I69320 Aphasia following cerebral infarction: Secondary | ICD-10-CM | POA: Diagnosis not present

## 2022-04-16 DIAGNOSIS — U071 COVID-19: Secondary | ICD-10-CM | POA: Diagnosis not present

## 2022-04-16 DIAGNOSIS — I69391 Dysphagia following cerebral infarction: Secondary | ICD-10-CM | POA: Diagnosis not present

## 2022-04-16 DIAGNOSIS — R7303 Prediabetes: Secondary | ICD-10-CM | POA: Diagnosis not present

## 2022-04-16 DIAGNOSIS — I351 Nonrheumatic aortic (valve) insufficiency: Secondary | ICD-10-CM | POA: Diagnosis not present

## 2022-04-16 DIAGNOSIS — G8929 Other chronic pain: Secondary | ICD-10-CM | POA: Diagnosis not present

## 2022-04-16 DIAGNOSIS — Z7902 Long term (current) use of antithrombotics/antiplatelets: Secondary | ICD-10-CM | POA: Diagnosis not present

## 2022-04-16 DIAGNOSIS — Z9049 Acquired absence of other specified parts of digestive tract: Secondary | ICD-10-CM | POA: Diagnosis not present

## 2022-04-16 DIAGNOSIS — Z981 Arthrodesis status: Secondary | ICD-10-CM | POA: Diagnosis not present

## 2022-04-16 DIAGNOSIS — Z993 Dependence on wheelchair: Secondary | ICD-10-CM | POA: Diagnosis not present

## 2022-04-16 DIAGNOSIS — Z9181 History of falling: Secondary | ICD-10-CM | POA: Diagnosis not present

## 2022-04-16 DIAGNOSIS — I69392 Facial weakness following cerebral infarction: Secondary | ICD-10-CM | POA: Diagnosis not present

## 2022-04-16 DIAGNOSIS — I69351 Hemiplegia and hemiparesis following cerebral infarction affecting right dominant side: Secondary | ICD-10-CM | POA: Diagnosis not present

## 2022-04-16 DIAGNOSIS — I700 Atherosclerosis of aorta: Secondary | ICD-10-CM | POA: Diagnosis not present

## 2022-04-16 DIAGNOSIS — E43 Unspecified severe protein-calorie malnutrition: Secondary | ICD-10-CM | POA: Diagnosis not present

## 2022-04-16 DIAGNOSIS — M1990 Unspecified osteoarthritis, unspecified site: Secondary | ICD-10-CM | POA: Diagnosis not present

## 2022-04-17 DIAGNOSIS — E43 Unspecified severe protein-calorie malnutrition: Secondary | ICD-10-CM | POA: Diagnosis not present

## 2022-04-17 DIAGNOSIS — G8929 Other chronic pain: Secondary | ICD-10-CM | POA: Diagnosis not present

## 2022-04-17 DIAGNOSIS — Z993 Dependence on wheelchair: Secondary | ICD-10-CM | POA: Diagnosis not present

## 2022-04-17 DIAGNOSIS — M1990 Unspecified osteoarthritis, unspecified site: Secondary | ICD-10-CM | POA: Diagnosis not present

## 2022-04-17 DIAGNOSIS — Z7982 Long term (current) use of aspirin: Secondary | ICD-10-CM | POA: Diagnosis not present

## 2022-04-17 DIAGNOSIS — Z9049 Acquired absence of other specified parts of digestive tract: Secondary | ICD-10-CM | POA: Diagnosis not present

## 2022-04-17 DIAGNOSIS — Z7902 Long term (current) use of antithrombotics/antiplatelets: Secondary | ICD-10-CM | POA: Diagnosis not present

## 2022-04-17 DIAGNOSIS — M5030 Other cervical disc degeneration, unspecified cervical region: Secondary | ICD-10-CM | POA: Diagnosis not present

## 2022-04-17 DIAGNOSIS — E785 Hyperlipidemia, unspecified: Secondary | ICD-10-CM | POA: Diagnosis not present

## 2022-04-17 DIAGNOSIS — Z981 Arthrodesis status: Secondary | ICD-10-CM | POA: Diagnosis not present

## 2022-04-17 DIAGNOSIS — M5412 Radiculopathy, cervical region: Secondary | ICD-10-CM | POA: Diagnosis not present

## 2022-04-17 DIAGNOSIS — Z9181 History of falling: Secondary | ICD-10-CM | POA: Diagnosis not present

## 2022-04-17 DIAGNOSIS — I69320 Aphasia following cerebral infarction: Secondary | ICD-10-CM | POA: Diagnosis not present

## 2022-04-17 DIAGNOSIS — I10 Essential (primary) hypertension: Secondary | ICD-10-CM | POA: Diagnosis not present

## 2022-04-17 DIAGNOSIS — I700 Atherosclerosis of aorta: Secondary | ICD-10-CM | POA: Diagnosis not present

## 2022-04-17 DIAGNOSIS — R1310 Dysphagia, unspecified: Secondary | ICD-10-CM | POA: Diagnosis not present

## 2022-04-17 DIAGNOSIS — I69391 Dysphagia following cerebral infarction: Secondary | ICD-10-CM | POA: Diagnosis not present

## 2022-04-17 DIAGNOSIS — R7303 Prediabetes: Secondary | ICD-10-CM | POA: Diagnosis not present

## 2022-04-17 DIAGNOSIS — I69392 Facial weakness following cerebral infarction: Secondary | ICD-10-CM | POA: Diagnosis not present

## 2022-04-17 DIAGNOSIS — U071 COVID-19: Secondary | ICD-10-CM | POA: Diagnosis not present

## 2022-04-17 DIAGNOSIS — H409 Unspecified glaucoma: Secondary | ICD-10-CM | POA: Diagnosis not present

## 2022-04-17 DIAGNOSIS — I69351 Hemiplegia and hemiparesis following cerebral infarction affecting right dominant side: Secondary | ICD-10-CM | POA: Diagnosis not present

## 2022-04-17 DIAGNOSIS — I351 Nonrheumatic aortic (valve) insufficiency: Secondary | ICD-10-CM | POA: Diagnosis not present

## 2022-04-18 DIAGNOSIS — Z7982 Long term (current) use of aspirin: Secondary | ICD-10-CM | POA: Diagnosis not present

## 2022-04-18 DIAGNOSIS — M5030 Other cervical disc degeneration, unspecified cervical region: Secondary | ICD-10-CM | POA: Diagnosis not present

## 2022-04-18 DIAGNOSIS — M5412 Radiculopathy, cervical region: Secondary | ICD-10-CM | POA: Diagnosis not present

## 2022-04-18 DIAGNOSIS — R7303 Prediabetes: Secondary | ICD-10-CM | POA: Diagnosis not present

## 2022-04-18 DIAGNOSIS — I351 Nonrheumatic aortic (valve) insufficiency: Secondary | ICD-10-CM | POA: Diagnosis not present

## 2022-04-18 DIAGNOSIS — I69391 Dysphagia following cerebral infarction: Secondary | ICD-10-CM | POA: Diagnosis not present

## 2022-04-18 DIAGNOSIS — E43 Unspecified severe protein-calorie malnutrition: Secondary | ICD-10-CM | POA: Diagnosis not present

## 2022-04-18 DIAGNOSIS — M1990 Unspecified osteoarthritis, unspecified site: Secondary | ICD-10-CM | POA: Diagnosis not present

## 2022-04-18 DIAGNOSIS — G8929 Other chronic pain: Secondary | ICD-10-CM | POA: Diagnosis not present

## 2022-04-18 DIAGNOSIS — I69351 Hemiplegia and hemiparesis following cerebral infarction affecting right dominant side: Secondary | ICD-10-CM | POA: Diagnosis not present

## 2022-04-18 DIAGNOSIS — H409 Unspecified glaucoma: Secondary | ICD-10-CM | POA: Diagnosis not present

## 2022-04-18 DIAGNOSIS — E785 Hyperlipidemia, unspecified: Secondary | ICD-10-CM | POA: Diagnosis not present

## 2022-04-18 DIAGNOSIS — Z9181 History of falling: Secondary | ICD-10-CM | POA: Diagnosis not present

## 2022-04-18 DIAGNOSIS — I10 Essential (primary) hypertension: Secondary | ICD-10-CM | POA: Diagnosis not present

## 2022-04-18 DIAGNOSIS — Z981 Arthrodesis status: Secondary | ICD-10-CM | POA: Diagnosis not present

## 2022-04-18 DIAGNOSIS — R1310 Dysphagia, unspecified: Secondary | ICD-10-CM | POA: Diagnosis not present

## 2022-04-18 DIAGNOSIS — U071 COVID-19: Secondary | ICD-10-CM | POA: Diagnosis not present

## 2022-04-18 DIAGNOSIS — I69392 Facial weakness following cerebral infarction: Secondary | ICD-10-CM | POA: Diagnosis not present

## 2022-04-18 DIAGNOSIS — Z993 Dependence on wheelchair: Secondary | ICD-10-CM | POA: Diagnosis not present

## 2022-04-18 DIAGNOSIS — I700 Atherosclerosis of aorta: Secondary | ICD-10-CM | POA: Diagnosis not present

## 2022-04-18 DIAGNOSIS — I69320 Aphasia following cerebral infarction: Secondary | ICD-10-CM | POA: Diagnosis not present

## 2022-04-18 DIAGNOSIS — Z7902 Long term (current) use of antithrombotics/antiplatelets: Secondary | ICD-10-CM | POA: Diagnosis not present

## 2022-04-18 DIAGNOSIS — Z9049 Acquired absence of other specified parts of digestive tract: Secondary | ICD-10-CM | POA: Diagnosis not present

## 2022-04-21 DIAGNOSIS — I10 Essential (primary) hypertension: Secondary | ICD-10-CM | POA: Diagnosis not present

## 2022-04-21 DIAGNOSIS — I351 Nonrheumatic aortic (valve) insufficiency: Secondary | ICD-10-CM | POA: Diagnosis not present

## 2022-04-21 DIAGNOSIS — M5030 Other cervical disc degeneration, unspecified cervical region: Secondary | ICD-10-CM | POA: Diagnosis not present

## 2022-04-21 DIAGNOSIS — Z7902 Long term (current) use of antithrombotics/antiplatelets: Secondary | ICD-10-CM | POA: Diagnosis not present

## 2022-04-21 DIAGNOSIS — R7303 Prediabetes: Secondary | ICD-10-CM | POA: Diagnosis not present

## 2022-04-21 DIAGNOSIS — Z981 Arthrodesis status: Secondary | ICD-10-CM | POA: Diagnosis not present

## 2022-04-21 DIAGNOSIS — Z9181 History of falling: Secondary | ICD-10-CM | POA: Diagnosis not present

## 2022-04-21 DIAGNOSIS — M1990 Unspecified osteoarthritis, unspecified site: Secondary | ICD-10-CM | POA: Diagnosis not present

## 2022-04-21 DIAGNOSIS — I69320 Aphasia following cerebral infarction: Secondary | ICD-10-CM | POA: Diagnosis not present

## 2022-04-21 DIAGNOSIS — H409 Unspecified glaucoma: Secondary | ICD-10-CM | POA: Diagnosis not present

## 2022-04-21 DIAGNOSIS — G8929 Other chronic pain: Secondary | ICD-10-CM | POA: Diagnosis not present

## 2022-04-21 DIAGNOSIS — Z993 Dependence on wheelchair: Secondary | ICD-10-CM | POA: Diagnosis not present

## 2022-04-21 DIAGNOSIS — Z7982 Long term (current) use of aspirin: Secondary | ICD-10-CM | POA: Diagnosis not present

## 2022-04-21 DIAGNOSIS — E43 Unspecified severe protein-calorie malnutrition: Secondary | ICD-10-CM | POA: Diagnosis not present

## 2022-04-21 DIAGNOSIS — E785 Hyperlipidemia, unspecified: Secondary | ICD-10-CM | POA: Diagnosis not present

## 2022-04-21 DIAGNOSIS — I69391 Dysphagia following cerebral infarction: Secondary | ICD-10-CM | POA: Diagnosis not present

## 2022-04-21 DIAGNOSIS — R1310 Dysphagia, unspecified: Secondary | ICD-10-CM | POA: Diagnosis not present

## 2022-04-21 DIAGNOSIS — I700 Atherosclerosis of aorta: Secondary | ICD-10-CM | POA: Diagnosis not present

## 2022-04-21 DIAGNOSIS — M5412 Radiculopathy, cervical region: Secondary | ICD-10-CM | POA: Diagnosis not present

## 2022-04-21 DIAGNOSIS — Z9049 Acquired absence of other specified parts of digestive tract: Secondary | ICD-10-CM | POA: Diagnosis not present

## 2022-04-21 DIAGNOSIS — I69351 Hemiplegia and hemiparesis following cerebral infarction affecting right dominant side: Secondary | ICD-10-CM | POA: Diagnosis not present

## 2022-04-21 DIAGNOSIS — I69392 Facial weakness following cerebral infarction: Secondary | ICD-10-CM | POA: Diagnosis not present

## 2022-04-21 DIAGNOSIS — U071 COVID-19: Secondary | ICD-10-CM | POA: Diagnosis not present

## 2022-04-22 DIAGNOSIS — I10 Essential (primary) hypertension: Secondary | ICD-10-CM | POA: Diagnosis not present

## 2022-04-22 DIAGNOSIS — E785 Hyperlipidemia, unspecified: Secondary | ICD-10-CM | POA: Diagnosis not present

## 2022-04-22 DIAGNOSIS — Z9181 History of falling: Secondary | ICD-10-CM | POA: Diagnosis not present

## 2022-04-22 DIAGNOSIS — Z7982 Long term (current) use of aspirin: Secondary | ICD-10-CM | POA: Diagnosis not present

## 2022-04-22 DIAGNOSIS — I69320 Aphasia following cerebral infarction: Secondary | ICD-10-CM | POA: Diagnosis not present

## 2022-04-22 DIAGNOSIS — M5030 Other cervical disc degeneration, unspecified cervical region: Secondary | ICD-10-CM | POA: Diagnosis not present

## 2022-04-22 DIAGNOSIS — I69392 Facial weakness following cerebral infarction: Secondary | ICD-10-CM | POA: Diagnosis not present

## 2022-04-22 DIAGNOSIS — I69391 Dysphagia following cerebral infarction: Secondary | ICD-10-CM | POA: Diagnosis not present

## 2022-04-22 DIAGNOSIS — I351 Nonrheumatic aortic (valve) insufficiency: Secondary | ICD-10-CM | POA: Diagnosis not present

## 2022-04-22 DIAGNOSIS — I700 Atherosclerosis of aorta: Secondary | ICD-10-CM | POA: Diagnosis not present

## 2022-04-22 DIAGNOSIS — E43 Unspecified severe protein-calorie malnutrition: Secondary | ICD-10-CM | POA: Diagnosis not present

## 2022-04-22 DIAGNOSIS — I69351 Hemiplegia and hemiparesis following cerebral infarction affecting right dominant side: Secondary | ICD-10-CM | POA: Diagnosis not present

## 2022-04-22 DIAGNOSIS — G8929 Other chronic pain: Secondary | ICD-10-CM | POA: Diagnosis not present

## 2022-04-22 DIAGNOSIS — R7303 Prediabetes: Secondary | ICD-10-CM | POA: Diagnosis not present

## 2022-04-22 DIAGNOSIS — M5412 Radiculopathy, cervical region: Secondary | ICD-10-CM | POA: Diagnosis not present

## 2022-04-22 DIAGNOSIS — U071 COVID-19: Secondary | ICD-10-CM | POA: Diagnosis not present

## 2022-04-22 DIAGNOSIS — M1990 Unspecified osteoarthritis, unspecified site: Secondary | ICD-10-CM | POA: Diagnosis not present

## 2022-04-22 DIAGNOSIS — Z993 Dependence on wheelchair: Secondary | ICD-10-CM | POA: Diagnosis not present

## 2022-04-22 DIAGNOSIS — Z981 Arthrodesis status: Secondary | ICD-10-CM | POA: Diagnosis not present

## 2022-04-22 DIAGNOSIS — Z7902 Long term (current) use of antithrombotics/antiplatelets: Secondary | ICD-10-CM | POA: Diagnosis not present

## 2022-04-22 DIAGNOSIS — R1310 Dysphagia, unspecified: Secondary | ICD-10-CM | POA: Diagnosis not present

## 2022-04-22 DIAGNOSIS — H409 Unspecified glaucoma: Secondary | ICD-10-CM | POA: Diagnosis not present

## 2022-04-22 DIAGNOSIS — Z9049 Acquired absence of other specified parts of digestive tract: Secondary | ICD-10-CM | POA: Diagnosis not present

## 2022-04-23 DIAGNOSIS — I69320 Aphasia following cerebral infarction: Secondary | ICD-10-CM | POA: Diagnosis not present

## 2022-04-23 DIAGNOSIS — I10 Essential (primary) hypertension: Secondary | ICD-10-CM | POA: Diagnosis not present

## 2022-04-23 DIAGNOSIS — M5030 Other cervical disc degeneration, unspecified cervical region: Secondary | ICD-10-CM | POA: Diagnosis not present

## 2022-04-23 DIAGNOSIS — Z993 Dependence on wheelchair: Secondary | ICD-10-CM | POA: Diagnosis not present

## 2022-04-23 DIAGNOSIS — E43 Unspecified severe protein-calorie malnutrition: Secondary | ICD-10-CM | POA: Diagnosis not present

## 2022-04-23 DIAGNOSIS — H409 Unspecified glaucoma: Secondary | ICD-10-CM | POA: Diagnosis not present

## 2022-04-23 DIAGNOSIS — G8929 Other chronic pain: Secondary | ICD-10-CM | POA: Diagnosis not present

## 2022-04-23 DIAGNOSIS — Z7902 Long term (current) use of antithrombotics/antiplatelets: Secondary | ICD-10-CM | POA: Diagnosis not present

## 2022-04-23 DIAGNOSIS — U071 COVID-19: Secondary | ICD-10-CM | POA: Diagnosis not present

## 2022-04-23 DIAGNOSIS — I351 Nonrheumatic aortic (valve) insufficiency: Secondary | ICD-10-CM | POA: Diagnosis not present

## 2022-04-23 DIAGNOSIS — Z9181 History of falling: Secondary | ICD-10-CM | POA: Diagnosis not present

## 2022-04-23 DIAGNOSIS — Z981 Arthrodesis status: Secondary | ICD-10-CM | POA: Diagnosis not present

## 2022-04-23 DIAGNOSIS — M5412 Radiculopathy, cervical region: Secondary | ICD-10-CM | POA: Diagnosis not present

## 2022-04-23 DIAGNOSIS — Z7982 Long term (current) use of aspirin: Secondary | ICD-10-CM | POA: Diagnosis not present

## 2022-04-23 DIAGNOSIS — I69392 Facial weakness following cerebral infarction: Secondary | ICD-10-CM | POA: Diagnosis not present

## 2022-04-23 DIAGNOSIS — R1310 Dysphagia, unspecified: Secondary | ICD-10-CM | POA: Diagnosis not present

## 2022-04-23 DIAGNOSIS — I69351 Hemiplegia and hemiparesis following cerebral infarction affecting right dominant side: Secondary | ICD-10-CM | POA: Diagnosis not present

## 2022-04-23 DIAGNOSIS — E785 Hyperlipidemia, unspecified: Secondary | ICD-10-CM | POA: Diagnosis not present

## 2022-04-23 DIAGNOSIS — I700 Atherosclerosis of aorta: Secondary | ICD-10-CM | POA: Diagnosis not present

## 2022-04-23 DIAGNOSIS — Z9049 Acquired absence of other specified parts of digestive tract: Secondary | ICD-10-CM | POA: Diagnosis not present

## 2022-04-23 DIAGNOSIS — M1990 Unspecified osteoarthritis, unspecified site: Secondary | ICD-10-CM | POA: Diagnosis not present

## 2022-04-23 DIAGNOSIS — I69391 Dysphagia following cerebral infarction: Secondary | ICD-10-CM | POA: Diagnosis not present

## 2022-04-23 DIAGNOSIS — R7303 Prediabetes: Secondary | ICD-10-CM | POA: Diagnosis not present

## 2022-04-24 ENCOUNTER — Inpatient Hospital Stay: Payer: Medicare Other | Admitting: Neurology

## 2022-04-24 DIAGNOSIS — I69320 Aphasia following cerebral infarction: Secondary | ICD-10-CM | POA: Diagnosis not present

## 2022-04-24 DIAGNOSIS — M5030 Other cervical disc degeneration, unspecified cervical region: Secondary | ICD-10-CM | POA: Diagnosis not present

## 2022-04-24 DIAGNOSIS — M1990 Unspecified osteoarthritis, unspecified site: Secondary | ICD-10-CM | POA: Diagnosis not present

## 2022-04-24 DIAGNOSIS — E43 Unspecified severe protein-calorie malnutrition: Secondary | ICD-10-CM | POA: Diagnosis not present

## 2022-04-24 DIAGNOSIS — U071 COVID-19: Secondary | ICD-10-CM | POA: Diagnosis not present

## 2022-04-24 DIAGNOSIS — I10 Essential (primary) hypertension: Secondary | ICD-10-CM | POA: Diagnosis not present

## 2022-04-24 DIAGNOSIS — Z7902 Long term (current) use of antithrombotics/antiplatelets: Secondary | ICD-10-CM | POA: Diagnosis not present

## 2022-04-24 DIAGNOSIS — H409 Unspecified glaucoma: Secondary | ICD-10-CM | POA: Diagnosis not present

## 2022-04-24 DIAGNOSIS — Z993 Dependence on wheelchair: Secondary | ICD-10-CM | POA: Diagnosis not present

## 2022-04-24 DIAGNOSIS — R7303 Prediabetes: Secondary | ICD-10-CM | POA: Diagnosis not present

## 2022-04-24 DIAGNOSIS — I69391 Dysphagia following cerebral infarction: Secondary | ICD-10-CM | POA: Diagnosis not present

## 2022-04-24 DIAGNOSIS — I69351 Hemiplegia and hemiparesis following cerebral infarction affecting right dominant side: Secondary | ICD-10-CM | POA: Diagnosis not present

## 2022-04-24 DIAGNOSIS — E785 Hyperlipidemia, unspecified: Secondary | ICD-10-CM | POA: Diagnosis not present

## 2022-04-24 DIAGNOSIS — I69392 Facial weakness following cerebral infarction: Secondary | ICD-10-CM | POA: Diagnosis not present

## 2022-04-24 DIAGNOSIS — Z7982 Long term (current) use of aspirin: Secondary | ICD-10-CM | POA: Diagnosis not present

## 2022-04-24 DIAGNOSIS — M5412 Radiculopathy, cervical region: Secondary | ICD-10-CM | POA: Diagnosis not present

## 2022-04-24 DIAGNOSIS — G8929 Other chronic pain: Secondary | ICD-10-CM | POA: Diagnosis not present

## 2022-04-24 DIAGNOSIS — I700 Atherosclerosis of aorta: Secondary | ICD-10-CM | POA: Diagnosis not present

## 2022-04-24 DIAGNOSIS — I351 Nonrheumatic aortic (valve) insufficiency: Secondary | ICD-10-CM | POA: Diagnosis not present

## 2022-04-24 DIAGNOSIS — Z9049 Acquired absence of other specified parts of digestive tract: Secondary | ICD-10-CM | POA: Diagnosis not present

## 2022-04-24 DIAGNOSIS — Z9181 History of falling: Secondary | ICD-10-CM | POA: Diagnosis not present

## 2022-04-24 DIAGNOSIS — Z981 Arthrodesis status: Secondary | ICD-10-CM | POA: Diagnosis not present

## 2022-04-24 DIAGNOSIS — R1310 Dysphagia, unspecified: Secondary | ICD-10-CM | POA: Diagnosis not present

## 2022-04-25 DIAGNOSIS — H409 Unspecified glaucoma: Secondary | ICD-10-CM | POA: Diagnosis not present

## 2022-04-25 DIAGNOSIS — M5412 Radiculopathy, cervical region: Secondary | ICD-10-CM | POA: Diagnosis not present

## 2022-04-25 DIAGNOSIS — E43 Unspecified severe protein-calorie malnutrition: Secondary | ICD-10-CM | POA: Diagnosis not present

## 2022-04-25 DIAGNOSIS — R1310 Dysphagia, unspecified: Secondary | ICD-10-CM | POA: Diagnosis not present

## 2022-04-25 DIAGNOSIS — Z981 Arthrodesis status: Secondary | ICD-10-CM | POA: Diagnosis not present

## 2022-04-25 DIAGNOSIS — G8929 Other chronic pain: Secondary | ICD-10-CM | POA: Diagnosis not present

## 2022-04-25 DIAGNOSIS — I69391 Dysphagia following cerebral infarction: Secondary | ICD-10-CM | POA: Diagnosis not present

## 2022-04-25 DIAGNOSIS — R7303 Prediabetes: Secondary | ICD-10-CM | POA: Diagnosis not present

## 2022-04-25 DIAGNOSIS — I69351 Hemiplegia and hemiparesis following cerebral infarction affecting right dominant side: Secondary | ICD-10-CM | POA: Diagnosis not present

## 2022-04-25 DIAGNOSIS — Z7982 Long term (current) use of aspirin: Secondary | ICD-10-CM | POA: Diagnosis not present

## 2022-04-25 DIAGNOSIS — E785 Hyperlipidemia, unspecified: Secondary | ICD-10-CM | POA: Diagnosis not present

## 2022-04-25 DIAGNOSIS — M1990 Unspecified osteoarthritis, unspecified site: Secondary | ICD-10-CM | POA: Diagnosis not present

## 2022-04-25 DIAGNOSIS — Z7902 Long term (current) use of antithrombotics/antiplatelets: Secondary | ICD-10-CM | POA: Diagnosis not present

## 2022-04-25 DIAGNOSIS — I351 Nonrheumatic aortic (valve) insufficiency: Secondary | ICD-10-CM | POA: Diagnosis not present

## 2022-04-25 DIAGNOSIS — Z9181 History of falling: Secondary | ICD-10-CM | POA: Diagnosis not present

## 2022-04-25 DIAGNOSIS — Z9049 Acquired absence of other specified parts of digestive tract: Secondary | ICD-10-CM | POA: Diagnosis not present

## 2022-04-25 DIAGNOSIS — I10 Essential (primary) hypertension: Secondary | ICD-10-CM | POA: Diagnosis not present

## 2022-04-25 DIAGNOSIS — I700 Atherosclerosis of aorta: Secondary | ICD-10-CM | POA: Diagnosis not present

## 2022-04-25 DIAGNOSIS — M5030 Other cervical disc degeneration, unspecified cervical region: Secondary | ICD-10-CM | POA: Diagnosis not present

## 2022-04-25 DIAGNOSIS — I69320 Aphasia following cerebral infarction: Secondary | ICD-10-CM | POA: Diagnosis not present

## 2022-04-25 DIAGNOSIS — Z993 Dependence on wheelchair: Secondary | ICD-10-CM | POA: Diagnosis not present

## 2022-04-25 DIAGNOSIS — U071 COVID-19: Secondary | ICD-10-CM | POA: Diagnosis not present

## 2022-04-25 DIAGNOSIS — I69392 Facial weakness following cerebral infarction: Secondary | ICD-10-CM | POA: Diagnosis not present

## 2022-04-29 ENCOUNTER — Telehealth: Payer: Self-pay | Admitting: *Deleted

## 2022-04-29 ENCOUNTER — Ambulatory Visit: Payer: Medicare Other | Admitting: Nurse Practitioner

## 2022-04-29 DIAGNOSIS — I69320 Aphasia following cerebral infarction: Secondary | ICD-10-CM | POA: Diagnosis not present

## 2022-04-29 DIAGNOSIS — Z993 Dependence on wheelchair: Secondary | ICD-10-CM | POA: Diagnosis not present

## 2022-04-29 DIAGNOSIS — R7303 Prediabetes: Secondary | ICD-10-CM | POA: Diagnosis not present

## 2022-04-29 DIAGNOSIS — I69351 Hemiplegia and hemiparesis following cerebral infarction affecting right dominant side: Secondary | ICD-10-CM | POA: Diagnosis not present

## 2022-04-29 DIAGNOSIS — R1310 Dysphagia, unspecified: Secondary | ICD-10-CM | POA: Diagnosis not present

## 2022-04-29 DIAGNOSIS — E43 Unspecified severe protein-calorie malnutrition: Secondary | ICD-10-CM | POA: Diagnosis not present

## 2022-04-29 DIAGNOSIS — H409 Unspecified glaucoma: Secondary | ICD-10-CM | POA: Diagnosis not present

## 2022-04-29 DIAGNOSIS — Z981 Arthrodesis status: Secondary | ICD-10-CM | POA: Diagnosis not present

## 2022-04-29 DIAGNOSIS — Z7902 Long term (current) use of antithrombotics/antiplatelets: Secondary | ICD-10-CM | POA: Diagnosis not present

## 2022-04-29 DIAGNOSIS — I10 Essential (primary) hypertension: Secondary | ICD-10-CM | POA: Diagnosis not present

## 2022-04-29 DIAGNOSIS — Z9049 Acquired absence of other specified parts of digestive tract: Secondary | ICD-10-CM | POA: Diagnosis not present

## 2022-04-29 DIAGNOSIS — I69391 Dysphagia following cerebral infarction: Secondary | ICD-10-CM | POA: Diagnosis not present

## 2022-04-29 DIAGNOSIS — I351 Nonrheumatic aortic (valve) insufficiency: Secondary | ICD-10-CM | POA: Diagnosis not present

## 2022-04-29 DIAGNOSIS — G8929 Other chronic pain: Secondary | ICD-10-CM | POA: Diagnosis not present

## 2022-04-29 DIAGNOSIS — Z7982 Long term (current) use of aspirin: Secondary | ICD-10-CM | POA: Diagnosis not present

## 2022-04-29 DIAGNOSIS — M5030 Other cervical disc degeneration, unspecified cervical region: Secondary | ICD-10-CM | POA: Diagnosis not present

## 2022-04-29 DIAGNOSIS — M1990 Unspecified osteoarthritis, unspecified site: Secondary | ICD-10-CM | POA: Diagnosis not present

## 2022-04-29 DIAGNOSIS — I69392 Facial weakness following cerebral infarction: Secondary | ICD-10-CM | POA: Diagnosis not present

## 2022-04-29 DIAGNOSIS — E785 Hyperlipidemia, unspecified: Secondary | ICD-10-CM | POA: Diagnosis not present

## 2022-04-29 DIAGNOSIS — Z9181 History of falling: Secondary | ICD-10-CM | POA: Diagnosis not present

## 2022-04-29 DIAGNOSIS — M5412 Radiculopathy, cervical region: Secondary | ICD-10-CM | POA: Diagnosis not present

## 2022-04-29 DIAGNOSIS — I700 Atherosclerosis of aorta: Secondary | ICD-10-CM | POA: Diagnosis not present

## 2022-04-29 DIAGNOSIS — U071 COVID-19: Secondary | ICD-10-CM | POA: Diagnosis not present

## 2022-04-29 NOTE — Telephone Encounter (Signed)
TC from [place] [facility]/ [place] Seeing pt today, he has a fever of 100.5, not eating or drinking. She was able to get him to drink a little & take his medications. Made a video visit for in the morning w/ his PCP. Family member will come by to pick up a urine specimen cup.

## 2022-04-30 ENCOUNTER — Encounter: Payer: Self-pay | Admitting: Family Medicine

## 2022-04-30 ENCOUNTER — Telehealth (INDEPENDENT_AMBULATORY_CARE_PROVIDER_SITE_OTHER): Payer: Medicare Other | Admitting: Family Medicine

## 2022-04-30 DIAGNOSIS — E785 Hyperlipidemia, unspecified: Secondary | ICD-10-CM | POA: Diagnosis not present

## 2022-04-30 DIAGNOSIS — Z9181 History of falling: Secondary | ICD-10-CM | POA: Diagnosis not present

## 2022-04-30 DIAGNOSIS — R300 Dysuria: Secondary | ICD-10-CM

## 2022-04-30 DIAGNOSIS — I69392 Facial weakness following cerebral infarction: Secondary | ICD-10-CM | POA: Diagnosis not present

## 2022-04-30 DIAGNOSIS — I69320 Aphasia following cerebral infarction: Secondary | ICD-10-CM | POA: Diagnosis not present

## 2022-04-30 DIAGNOSIS — I10 Essential (primary) hypertension: Secondary | ICD-10-CM | POA: Diagnosis not present

## 2022-04-30 DIAGNOSIS — Z8673 Personal history of transient ischemic attack (TIA), and cerebral infarction without residual deficits: Secondary | ICD-10-CM | POA: Diagnosis not present

## 2022-04-30 DIAGNOSIS — E43 Unspecified severe protein-calorie malnutrition: Secondary | ICD-10-CM | POA: Diagnosis not present

## 2022-04-30 DIAGNOSIS — M5412 Radiculopathy, cervical region: Secondary | ICD-10-CM | POA: Diagnosis not present

## 2022-04-30 DIAGNOSIS — I69391 Dysphagia following cerebral infarction: Secondary | ICD-10-CM

## 2022-04-30 DIAGNOSIS — Z7401 Bed confinement status: Secondary | ICD-10-CM | POA: Diagnosis not present

## 2022-04-30 DIAGNOSIS — G8929 Other chronic pain: Secondary | ICD-10-CM | POA: Diagnosis not present

## 2022-04-30 DIAGNOSIS — I700 Atherosclerosis of aorta: Secondary | ICD-10-CM | POA: Diagnosis not present

## 2022-04-30 DIAGNOSIS — R627 Adult failure to thrive: Secondary | ICD-10-CM

## 2022-04-30 DIAGNOSIS — N3090 Cystitis, unspecified without hematuria: Secondary | ICD-10-CM | POA: Diagnosis not present

## 2022-04-30 DIAGNOSIS — H409 Unspecified glaucoma: Secondary | ICD-10-CM | POA: Diagnosis not present

## 2022-04-30 DIAGNOSIS — Z993 Dependence on wheelchair: Secondary | ICD-10-CM | POA: Diagnosis not present

## 2022-04-30 DIAGNOSIS — R7303 Prediabetes: Secondary | ICD-10-CM | POA: Diagnosis not present

## 2022-04-30 DIAGNOSIS — Z7902 Long term (current) use of antithrombotics/antiplatelets: Secondary | ICD-10-CM | POA: Diagnosis not present

## 2022-04-30 DIAGNOSIS — Z7982 Long term (current) use of aspirin: Secondary | ICD-10-CM | POA: Diagnosis not present

## 2022-04-30 DIAGNOSIS — I69351 Hemiplegia and hemiparesis following cerebral infarction affecting right dominant side: Secondary | ICD-10-CM | POA: Diagnosis not present

## 2022-04-30 DIAGNOSIS — U071 COVID-19: Secondary | ICD-10-CM | POA: Diagnosis not present

## 2022-04-30 DIAGNOSIS — M5030 Other cervical disc degeneration, unspecified cervical region: Secondary | ICD-10-CM | POA: Diagnosis not present

## 2022-04-30 DIAGNOSIS — R1310 Dysphagia, unspecified: Secondary | ICD-10-CM | POA: Diagnosis not present

## 2022-04-30 DIAGNOSIS — I351 Nonrheumatic aortic (valve) insufficiency: Secondary | ICD-10-CM | POA: Diagnosis not present

## 2022-04-30 DIAGNOSIS — M1990 Unspecified osteoarthritis, unspecified site: Secondary | ICD-10-CM | POA: Diagnosis not present

## 2022-04-30 DIAGNOSIS — Z9049 Acquired absence of other specified parts of digestive tract: Secondary | ICD-10-CM | POA: Diagnosis not present

## 2022-04-30 DIAGNOSIS — Z981 Arthrodesis status: Secondary | ICD-10-CM | POA: Diagnosis not present

## 2022-04-30 NOTE — Progress Notes (Signed)
Virtual Visit via MyChart Video Note Due to COVID-19 pandemic this visit was conducted virtually. This visit type was conducted due to national recommendations for restrictions regarding the COVID-19 Pandemic (e.g. social distancing, sheltering in place) in an effort to limit this patient's exposure and mitigate transmission in our community. All issues noted in this document were discussed and addressed.  A physical exam was not performed with this format.   I connected with Paul Bradshaw's son on 04/30/2022 at 0935 by MyChart Video and verified that I am speaking with the correct person using two identifiers. Paul Bradshaw is currently located at home and family is currently with them during visit. The provider, Kari Baars, FNP is located in their office at time of visit.  I discussed the limitations, risks, security and privacy concerns of performing an evaluation and management service by virtual visit and the availability of in person appointments. I also discussed with the patient that there may be a patient responsible charge related to this service. The patient expressed understanding and agreed to proceed.  Subjective:  Patient ID: Paul Bradshaw, male    DOB: 02-20-1926, 88 y.o.   MRN: [id number]  Chief Complaint:  Dysuria   HPI: Paul Bradshaw is a 88 y.o. male presenting on 04/30/2022 for Dysuria   Pts son reports a fever yesterday. Concerned about recurrent UTI. He will try to collect a sample and bring to office. Son also reports pts condition continues to decline. He is not eating or drinking, moans and groans in pain, unable to perform any ADLs. Reports it is getting more difficult to care for him at home and feels it is time to get help caring for him. Would like to make him more comfortable.      Relevant past medical, surgical, family, and social history reviewed and updated as indicated.  Allergies and medications reviewed and updated.   Past Medical History:   Diagnosis Date   Aortic regurgitation    Moderate   Arthritis    BPH (benign prostatic hyperplasia)    Cervical disc disease    Cervical radiculopathy    Hyperlipidemia    Hypertension    Prediabetes 02/16/2021   Staphylococcus aureus bacteremia 08/09/2012   TEE negative for vegetation or thrombus February 2014   Stroke [facility]) 2005 or 2006   3   Symptomatic carotid artery stenosis with infarction [facility]) 2005 or 2006   Status post right carotid endarterectomy    Past Surgical History:  Procedure Laterality Date   BACK SURGERY     CAROTID ENDARTERECTOMY     CATARACT EXTRACTION W/PHACO Left 02/15/2015   Procedure: CATARACT EXTRACTION PHACO AND INTRAOCULAR LENS PLACEMENT LEFT EYE CDE=30.93;  Surgeon: Gemma Payor, MD;  Location: [facility];  Service: Ophthalmology;  Laterality: Left;   CHOLECYSTECTOMY     EYE SURGERY     KIDNEY STONE SURGERY     LUMBAR LAMINECTOMY/DECOMPRESSION MICRODISCECTOMY Bilateral 01/23/2014   Procedure: LUMBAR LAMINECTOMY/DECOMPRESSION MICRODISCECTOMY 1 LEVEL L5-S1;  Surgeon: Temple Pacini, MD;  Location: [facility];  Service: Neurosurgery;  Laterality: Bilateral;  LUMBAR LAMINECTOMY/DECOMPRESSION MICRODISCECTOMY 1 LEVEL L5-S1   TEE WITHOUT CARDIOVERSION N/A 08/12/2012   Procedure: TRANSESOPHAGEAL ECHOCARDIOGRAM (TEE);  Surgeon: Wendall Stade, MD;  [facility];  Service: Cardiovascular;  Laterality: N/A;   TEE WITHOUT CARDIOVERSION N/A 06/24/2013   Procedure: TRANSESOPHAGEAL ECHOCARDIOGRAM (TEE);  Surgeon: Jonelle Sidle, MD;  Location: [facility];  Service: Endoscopy;  Laterality: N/A;  Social History   Socioeconomic History   Marital status: Married    Spouse name: Not on file   Number of children: Not on file   Years of education: Not on file   Highest education level: Not on file  Occupational History   Not on file  Tobacco Use   Smoking status: Never   Smokeless tobacco: Never  Vaping Use   Vaping Use: Never used  Substance  and Sexual Activity   Alcohol use: No   Drug use: No   Sexual activity: Not Currently  Other Topics Concern   Not on file  Social History Narrative   Not on file   Social Determinants of Health   Financial Resource Strain: Not on file  Food Insecurity: Not on file  Transportation Needs: No Transportation Needs (03/14/2022)   PRAPARE - Transportation    Lack of Transportation (Medical): No    Lack of Transportation (Non-Medical): No  Physical Activity: Not on file  Stress: Not on file  Social Connections: Not on file  Intimate Partner Violence: Not on file    Outpatient Encounter Medications as of 04/30/2022  Medication Sig   acetaminophen (TYLENOL) 325 MG tablet Take 1-2 tablets (325-650 mg total) by mouth every 4 (four) hours as needed for mild pain.   albuterol (VENTOLIN HFA) 108 (90 Base) MCG/ACT inhaler Inhale 2 puffs into the lungs every 6 (six) hours as needed for shortness of breath or wheezing.   aspirin EC 81 MG tablet Take 1 tablet (81 mg total) by mouth daily with breakfast.   atorvastatin (LIPITOR) 10 MG tablet Take 1 tablet (10 mg total) by mouth daily.   brimonidine (ALPHAGAN) 0.2 % ophthalmic solution Place 1 drop into both eyes 3 (three) times daily.   clopidogrel (PLAVIX) 75 MG tablet Take 1 tablet (75 mg total) by mouth daily with breakfast.   dorzolamide-timolol (COSOPT) 22.3-6.8 MG/ML ophthalmic solution Place 1 drop into both eyes 2 (two) times daily.   famotidine (PEPCID) 20 MG tablet Take 1 tablet (20 mg total) by mouth 2 (two) times daily.   finasteride (PROSCAR) 5 MG tablet Take 1 tablet (5 mg total) by mouth daily.   latanoprost (XALATAN) 0.005 % ophthalmic solution Place 1 drop into both eyes every evening.   metoprolol tartrate (LOPRESSOR) 25 MG tablet Take 1/2 tablet (12.5 mg total) by mouth 2 (two) times daily.   Mouthwashes (MOUTH RINSE) LIQD solution 15 mLs by Mouth Rinse route 4 (four) times daily - after meals and at bedtime.   Multiple  Vitamins-Minerals (MULTIVITAMIN PO) Take 1 tablet by mouth daily.   tamsulosin (FLOMAX) 0.4 MG CAPS capsule Take 0.4 mg by mouth daily.   Xanthan Gum GEL Take 1 packet (15 g total) by mouth as needed.   No facility-administered encounter medications on file as of 04/30/2022.    Allergies  Allergen Reactions   Amlodipine Swelling    Swelling of lips.   Lisinopril Other (See Comments)    Elevated BP higher & causes a burning feeling on the inside.     Review of Systems  Unable to perform ROS: Mental status change (ROS per son)  Constitutional:  Positive for activity change, appetite change and fatigue.  Genitourinary:  Positive for dysuria.  Neurological:  Positive for weakness.  Psychiatric/Behavioral:  Positive for confusion.          Observations/Objective: No vital signs or physical exam, this was a virtual health encounter.  Pt nonverbal during video, lying in bed, not opening  eyes.    Assessment and Plan: Ketch was seen today for dysuria.  Diagnoses and all orders for this visit:  Dysuria Son to bring urine sample, will treat if warranted.  -     Urine Culture -     Urinalysis  Failure to thrive in adult Dysphagia due to recent stroke Protein-calorie malnutrition, severe Bedridden History of CVA (cerebrovascular accident) After another discussion about pt status and poor prognosis, son finally agrees to hospice referral, referral placed today.  -     Ambulatory referral to Hospice     Follow Up Instructions: Return if symptoms worsen or fail to improve.    I discussed the assessment and treatment plan with the patient. The patient was provided an opportunity to ask questions and all were answered. The patient agreed with the plan and demonstrated an understanding of the instructions.   The patient was advised to call back or seek an in-person evaluation if the symptoms worsen or if the condition fails to improve as anticipated.  The above assessment and  management plan was discussed with the patient. The patient verbalized understanding of and has agreed to the management plan. Patient is aware to call the clinic if they develop any new symptoms or if symptoms persist or worsen. Patient is aware when to return to the clinic for a follow-up visit. Patient educated on when it is appropriate to go to the emergency department.    I provided 20 minutes of time during this MyChart encounter.   Monia Pouch, FNP-C [facility] [address] [place], [place] [address] [phone] 04/30/2022

## 2022-05-01 ENCOUNTER — Ambulatory Visit: Payer: Medicare Other | Admitting: Family Medicine

## 2022-05-01 ENCOUNTER — Telehealth: Payer: Self-pay | Admitting: *Deleted

## 2022-05-01 ENCOUNTER — Other Ambulatory Visit: Payer: Medicare Other

## 2022-05-01 DIAGNOSIS — Z7902 Long term (current) use of antithrombotics/antiplatelets: Secondary | ICD-10-CM | POA: Diagnosis not present

## 2022-05-01 DIAGNOSIS — I351 Nonrheumatic aortic (valve) insufficiency: Secondary | ICD-10-CM | POA: Diagnosis not present

## 2022-05-01 DIAGNOSIS — M5030 Other cervical disc degeneration, unspecified cervical region: Secondary | ICD-10-CM | POA: Diagnosis not present

## 2022-05-01 DIAGNOSIS — E785 Hyperlipidemia, unspecified: Secondary | ICD-10-CM | POA: Diagnosis not present

## 2022-05-01 DIAGNOSIS — U071 COVID-19: Secondary | ICD-10-CM | POA: Diagnosis not present

## 2022-05-01 DIAGNOSIS — E43 Unspecified severe protein-calorie malnutrition: Secondary | ICD-10-CM | POA: Diagnosis not present

## 2022-05-01 DIAGNOSIS — Z9181 History of falling: Secondary | ICD-10-CM | POA: Diagnosis not present

## 2022-05-01 DIAGNOSIS — Z993 Dependence on wheelchair: Secondary | ICD-10-CM | POA: Diagnosis not present

## 2022-05-01 DIAGNOSIS — Z981 Arthrodesis status: Secondary | ICD-10-CM | POA: Diagnosis not present

## 2022-05-01 DIAGNOSIS — I69391 Dysphagia following cerebral infarction: Secondary | ICD-10-CM | POA: Diagnosis not present

## 2022-05-01 DIAGNOSIS — H409 Unspecified glaucoma: Secondary | ICD-10-CM | POA: Diagnosis not present

## 2022-05-01 DIAGNOSIS — Z9049 Acquired absence of other specified parts of digestive tract: Secondary | ICD-10-CM | POA: Diagnosis not present

## 2022-05-01 DIAGNOSIS — I69351 Hemiplegia and hemiparesis following cerebral infarction affecting right dominant side: Secondary | ICD-10-CM | POA: Diagnosis not present

## 2022-05-01 DIAGNOSIS — G8929 Other chronic pain: Secondary | ICD-10-CM | POA: Diagnosis not present

## 2022-05-01 DIAGNOSIS — R7303 Prediabetes: Secondary | ICD-10-CM | POA: Diagnosis not present

## 2022-05-01 DIAGNOSIS — I69392 Facial weakness following cerebral infarction: Secondary | ICD-10-CM | POA: Diagnosis not present

## 2022-05-01 DIAGNOSIS — M5412 Radiculopathy, cervical region: Secondary | ICD-10-CM | POA: Diagnosis not present

## 2022-05-01 DIAGNOSIS — I10 Essential (primary) hypertension: Secondary | ICD-10-CM | POA: Diagnosis not present

## 2022-05-01 DIAGNOSIS — I700 Atherosclerosis of aorta: Secondary | ICD-10-CM | POA: Diagnosis not present

## 2022-05-01 DIAGNOSIS — M1990 Unspecified osteoarthritis, unspecified site: Secondary | ICD-10-CM | POA: Diagnosis not present

## 2022-05-01 DIAGNOSIS — R1310 Dysphagia, unspecified: Secondary | ICD-10-CM | POA: Diagnosis not present

## 2022-05-01 DIAGNOSIS — Z7982 Long term (current) use of aspirin: Secondary | ICD-10-CM | POA: Diagnosis not present

## 2022-05-01 DIAGNOSIS — R300 Dysuria: Secondary | ICD-10-CM | POA: Diagnosis not present

## 2022-05-01 DIAGNOSIS — I69320 Aphasia following cerebral infarction: Secondary | ICD-10-CM | POA: Diagnosis not present

## 2022-05-01 LAB — URINALYSIS
Bilirubin, UA: NEGATIVE
Glucose, UA: NEGATIVE
Ketones, UA: NEGATIVE
Leukocytes,UA: NEGATIVE
Nitrite, UA: NEGATIVE
RBC, UA: NEGATIVE
Specific Gravity, UA: 1.01 (ref 1.005–1.030)
Urobilinogen, Ur: 2 mg/dL — ABNORMAL HIGH (ref 0.2–1.0)
pH, UA: >9 — ABNORMAL HIGH (ref 5.0–7.5)

## 2022-05-01 NOTE — Telephone Encounter (Signed)
Called patient regarding urine results and spoke with son. Son states he mentioned possible gout in patients left wrist and hand and knuckles during tele visit yesterday. He is asking if something can be sent in for that.

## 2022-05-01 NOTE — Telephone Encounter (Signed)
Aware and verbalizes understanding.  

## 2022-05-02 ENCOUNTER — Encounter: Payer: Self-pay | Admitting: Nurse Practitioner

## 2022-05-02 ENCOUNTER — Ambulatory Visit (INDEPENDENT_AMBULATORY_CARE_PROVIDER_SITE_OTHER): Payer: Medicare Other | Admitting: Nurse Practitioner

## 2022-05-02 ENCOUNTER — Other Ambulatory Visit: Payer: Self-pay | Admitting: Family Medicine

## 2022-05-02 DIAGNOSIS — M10041 Idiopathic gout, right hand: Secondary | ICD-10-CM | POA: Diagnosis not present

## 2022-05-02 MED ORDER — COLCHICINE 0.6 MG PO TABS: ORAL_TABLET | ORAL | 0 refills | Status: DC

## 2022-05-02 NOTE — Progress Notes (Signed)
   Virtual Visit  Note Due to COVID-19 pandemic this visit was conducted virtually. This visit type was conducted due to national recommendations for restrictions regarding the COVID-19 Pandemic (e.g. social distancing, sheltering in place) in an effort to limit this patient's exposure and mitigate transmission in our community. All issues noted in this document were discussed and addressed.  A physical exam was not performed with this format.  I connected with Paul Bradshaw on 05/02/22 at 1:17 by telephone and verified that I am speaking with the correct person using two identifiers. Paul Bradshaw is currently located at home and his son is currently with him during visit. The provider, [name] Daphine Deutscher, FNP is located in their office at time of visit.  I discussed the limitations, risks, security and privacy concerns of performing an evaluation and management service by telephone and the availability of in person appointments. I also discussed with the patient that there may be a patient responsible charge related to this service. The patient expressed understanding and agreed to proceed.   History and Present Illness:  Had to speak with pateints son, because patient cannot hear on phone. Left hand started swelling and hurting about 2 weeks ago. Got better , then flaredup again a couple of days ago. He has a history of gout. The lightest touch on hand hurts.      Review of Systems  Constitutional:  Negative for chills and fever.  Musculoskeletal:  Positive for joint pain (right hand pain and swelling).     Observations/Objective: Alert and oriented- answers all questions appropriately No distress Reports right hand swollen and hot to touch Lab Results  Component Value Date   CREATININE 0.87 03/10/2022    Assessment and Plan: Paul Bradshaw in today with chief complaint of No chief complaint on file.   1. Acute idiopathic gout of right hand Ice bid May sure takes meds a s  prescribed  Meds ordered this encounter  Medications   colchicine 0.6 MG tablet    Sig: 2 tablets at pain onset, may repeat 1 tablet 1 hour later. No more then 3 tablets in 24 hours    Dispense:  15 tablet    Refill:  0    Order Specific Question:   Supervising Provider    Answer:   [facility] Care A [[id number]]     Follow Up Instructions: prn    I discussed the assessment and treatment plan with the patient. The patient was provided an opportunity to ask questions and all were answered. The patient agreed with the plan and demonstrated an understanding of the instructions.   The patient was advised to call back or seek an in-person evaluation if the symptoms worsen or if the condition fails to improve as anticipated.  The above assessment and management plan was discussed with the patient. The patient verbalized understanding of and has agreed to the management plan. Patient is aware to call the clinic if symptoms persist or worsen. Patient is aware when to return to the clinic for a follow-up visit. Patient educated on when it is appropriate to go to the emergency department.   Time call ended: 1 :30  I provided 13 minutes of  non face-to-face time during this encounter.    [name] Daphine Deutscher, FNP

## 2022-05-02 NOTE — Patient Instructions (Signed)
Gout  Gout is a condition that causes painful swelling of the joints. Gout is a type of inflammation of the joints (arthritis). This condition is caused by having too much uric acid in the body. Uric acid is a chemical that forms when the body breaks down substances called purines. Purines are important for building body proteins. When the body has too much uric acid, sharp crystals can form and build up inside the joints. This causes pain and swelling. Gout attacks can happen quickly and may be very painful (acute gout). Over time, the attacks can affect more joints and become more frequent (chronic gout). Gout can also cause uric acid to build up under the skin and inside the kidneys. What are the causes? This condition is caused by too much uric acid in your blood. This can happen because: Your kidneys do not remove enough uric acid from your blood. This is the most common cause. Your body makes too much uric acid. This can happen with some cancers and cancer treatments. It can also occur if your body is breaking down too many red blood cells (hemolytic anemia). You eat too many foods that are high in purines. These foods include organ meats and some seafood. Alcohol, especially beer, is also high in purines. A gout attack may be triggered by trauma or stress. What increases the risk? The following factors may make you more likely to develop this condition: Having a family history of gout. Being male and middle-aged. Being male and having gone through menopause. Taking certain medicines, including aspirin, cyclosporine, diuretics, levodopa, and niacin. Having an organ transplant. Having certain conditions, such as: Being obese. Lead poisoning. Kidney disease. A skin condition called psoriasis. Other factors include: Losing weight too quickly. Being dehydrated. Frequently drinking alcohol, especially beer. Frequently drinking beverages that are sweetened with a type of sugar called  fructose. What are the signs or symptoms? An attack of acute gout happens quickly. It usually occurs in just one joint. The most common place is the big toe. Attacks often start at night. Other joints that may be affected include joints of the feet, ankle, knee, fingers, wrist, or elbow. Symptoms of this condition may include: Severe pain. Warmth. Swelling. Stiffness. Tenderness. The affected joint may be very painful to touch. Shiny, red, or purple skin. Chills and fever. Chronic gout may cause symptoms more frequently. More joints may be involved. You may also have white or yellow lumps (tophi) on your hands or feet or in other areas near your joints. How is this diagnosed? This condition is diagnosed based on your symptoms, your medical history, and a physical exam. You may have tests, such as: Blood tests to measure uric acid levels. Removal of joint fluid with a thin needle (aspiration) to look for uric acid crystals. X-rays to look for joint damage. How is this treated? Treatment for this condition has two phases: treating an acute attack and preventing future attacks. Acute gout treatment may include medicines to reduce pain and swelling, including: NSAIDs, such as ibuprofen. Steroids. These are strong anti-inflammatory medicines that can be taken by mouth (orally) or injected into a joint. Colchicine. This medicine relieves pain and swelling when it is taken soon after an attack. It can be given by mouth or through an IV. Preventive treatment may include: Daily use of smaller doses of NSAIDs or colchicine. Use of a medicine that reduces uric acid levels in your blood, such as allopurinol. Changes to your diet. You may need to see   a dietitian about what to eat and drink to prevent gout. Follow these instructions at home: During a gout attack  If directed, put ice on the affected area. To do this: Put ice in a plastic bag. Place a towel between your skin and the bag. Leave the  ice on for 20 minutes, 2-3 times a day. Remove the ice if your skin turns bright red. This is very important. If you cannot feel pain, heat, or cold, you have a greater risk of damage to the area. Raise (elevate) the affected joint above the level of your heart as often as possible. Rest the joint as much as possible. If the affected joint is in your leg, you may be given crutches to use. Follow instructions from your health care provider about eating or drinking restrictions. Avoiding future gout attacks Follow a low-purine diet as told by your dietitian or health care provider. Avoid foods and drinks that are high in purines, including liver, kidney, anchovies, asparagus, herring, mushrooms, mussels, and beer. Maintain a healthy weight or lose weight if you are overweight. If you want to lose weight, talk with your health care provider. Do not lose weight too quickly. Start or maintain an exercise program as told by your health care provider. Eating and drinking Avoid drinking beverages that contain fructose. Drink enough fluids to keep your urine pale yellow. If you drink alcohol: Limit how much you have to: 0-1 drink a day for women who are not pregnant. 0-2 drinks a day for men. Know how much alcohol is in a drink. In the [place] one drink equals one 12 oz bottle of beer (355 mL), one 5 oz glass of wine (148 mL), or one 1 oz glass of hard liquor (44 mL). General instructions Take over-the-counter and prescription medicines only as told by your health care provider. Ask your health care provider if the medicine prescribed to you requires you to avoid driving or using machinery. Return to your normal activities as told by your health care provider. Ask your health care provider what activities are safe for you. Keep all follow-up visits. This is important. Where to find more information National Institutes of Health: www.niams.nih.gov Contact a health care provider if you have: Another  gout attack. Continuing symptoms of a gout attack after 10 days of treatment. Side effects from your medicines. Chills or a fever. Burning pain when you urinate. Pain in your lower back or abdomen. Get help right away if you: Have severe or uncontrolled pain. Cannot urinate. Summary Gout is painful swelling of the joints caused by having too much uric acid in the body. The most common site for gout to occur is in the big toe, but it can affect other joints in the body. Medicines and dietary changes can help to prevent and treat gout attacks. This information is not intended to replace advice given to you by your health care provider. Make sure you discuss any questions you have with your health care provider. Document Revised: 03/13/2021 Document Reviewed: 03/13/2021 Elsevier Patient Education  2023 Elsevier Inc.  

## 2022-05-04 LAB — URINE CULTURE

## 2022-05-05 MED ORDER — CIPROFLOXACIN HCL 500 MG PO TABS: 500.0000 mg | ORAL_TABLET | Freq: Two times a day (BID) | ORAL | 0 refills | Status: DC

## 2022-05-05 NOTE — Addendum Note (Signed)
Addended by: Sonny Masters on: 05/05/2022 08:22 AM   Modules accepted: Orders

## 2022-05-06 DIAGNOSIS — I69320 Aphasia following cerebral infarction: Secondary | ICD-10-CM | POA: Diagnosis not present

## 2022-05-06 DIAGNOSIS — R7303 Prediabetes: Secondary | ICD-10-CM | POA: Diagnosis not present

## 2022-05-06 DIAGNOSIS — I69392 Facial weakness following cerebral infarction: Secondary | ICD-10-CM | POA: Diagnosis not present

## 2022-05-06 DIAGNOSIS — I351 Nonrheumatic aortic (valve) insufficiency: Secondary | ICD-10-CM | POA: Diagnosis not present

## 2022-05-06 DIAGNOSIS — I700 Atherosclerosis of aorta: Secondary | ICD-10-CM | POA: Diagnosis not present

## 2022-05-06 DIAGNOSIS — I69391 Dysphagia following cerebral infarction: Secondary | ICD-10-CM | POA: Diagnosis not present

## 2022-05-06 DIAGNOSIS — Z9181 History of falling: Secondary | ICD-10-CM | POA: Diagnosis not present

## 2022-05-06 DIAGNOSIS — I10 Essential (primary) hypertension: Secondary | ICD-10-CM | POA: Diagnosis not present

## 2022-05-06 DIAGNOSIS — U071 COVID-19: Secondary | ICD-10-CM | POA: Diagnosis not present

## 2022-05-06 DIAGNOSIS — Z7902 Long term (current) use of antithrombotics/antiplatelets: Secondary | ICD-10-CM | POA: Diagnosis not present

## 2022-05-06 DIAGNOSIS — Z993 Dependence on wheelchair: Secondary | ICD-10-CM | POA: Diagnosis not present

## 2022-05-06 DIAGNOSIS — M1990 Unspecified osteoarthritis, unspecified site: Secondary | ICD-10-CM | POA: Diagnosis not present

## 2022-05-06 DIAGNOSIS — E785 Hyperlipidemia, unspecified: Secondary | ICD-10-CM | POA: Diagnosis not present

## 2022-05-06 DIAGNOSIS — M5412 Radiculopathy, cervical region: Secondary | ICD-10-CM | POA: Diagnosis not present

## 2022-05-06 DIAGNOSIS — Z981 Arthrodesis status: Secondary | ICD-10-CM | POA: Diagnosis not present

## 2022-05-06 DIAGNOSIS — R1310 Dysphagia, unspecified: Secondary | ICD-10-CM | POA: Diagnosis not present

## 2022-05-06 DIAGNOSIS — E43 Unspecified severe protein-calorie malnutrition: Secondary | ICD-10-CM | POA: Diagnosis not present

## 2022-05-06 DIAGNOSIS — H409 Unspecified glaucoma: Secondary | ICD-10-CM | POA: Diagnosis not present

## 2022-05-06 DIAGNOSIS — G8929 Other chronic pain: Secondary | ICD-10-CM | POA: Diagnosis not present

## 2022-05-06 DIAGNOSIS — M5030 Other cervical disc degeneration, unspecified cervical region: Secondary | ICD-10-CM | POA: Diagnosis not present

## 2022-05-06 DIAGNOSIS — Z7982 Long term (current) use of aspirin: Secondary | ICD-10-CM | POA: Diagnosis not present

## 2022-05-06 DIAGNOSIS — Z9049 Acquired absence of other specified parts of digestive tract: Secondary | ICD-10-CM | POA: Diagnosis not present

## 2022-05-06 DIAGNOSIS — I69351 Hemiplegia and hemiparesis following cerebral infarction affecting right dominant side: Secondary | ICD-10-CM | POA: Diagnosis not present

## 2022-05-07 DIAGNOSIS — Z7902 Long term (current) use of antithrombotics/antiplatelets: Secondary | ICD-10-CM | POA: Diagnosis not present

## 2022-05-07 DIAGNOSIS — I69351 Hemiplegia and hemiparesis following cerebral infarction affecting right dominant side: Secondary | ICD-10-CM | POA: Diagnosis not present

## 2022-05-07 DIAGNOSIS — I69320 Aphasia following cerebral infarction: Secondary | ICD-10-CM | POA: Diagnosis not present

## 2022-05-07 DIAGNOSIS — I69391 Dysphagia following cerebral infarction: Secondary | ICD-10-CM | POA: Diagnosis not present

## 2022-05-07 DIAGNOSIS — R1310 Dysphagia, unspecified: Secondary | ICD-10-CM | POA: Diagnosis not present

## 2022-05-07 DIAGNOSIS — M5412 Radiculopathy, cervical region: Secondary | ICD-10-CM | POA: Diagnosis not present

## 2022-05-07 DIAGNOSIS — H409 Unspecified glaucoma: Secondary | ICD-10-CM | POA: Diagnosis not present

## 2022-05-07 DIAGNOSIS — U071 COVID-19: Secondary | ICD-10-CM | POA: Diagnosis not present

## 2022-05-07 DIAGNOSIS — E785 Hyperlipidemia, unspecified: Secondary | ICD-10-CM | POA: Diagnosis not present

## 2022-05-07 DIAGNOSIS — M1990 Unspecified osteoarthritis, unspecified site: Secondary | ICD-10-CM | POA: Diagnosis not present

## 2022-05-07 DIAGNOSIS — Z993 Dependence on wheelchair: Secondary | ICD-10-CM | POA: Diagnosis not present

## 2022-05-07 DIAGNOSIS — Z981 Arthrodesis status: Secondary | ICD-10-CM | POA: Diagnosis not present

## 2022-05-07 DIAGNOSIS — R7303 Prediabetes: Secondary | ICD-10-CM | POA: Diagnosis not present

## 2022-05-07 DIAGNOSIS — Z7982 Long term (current) use of aspirin: Secondary | ICD-10-CM | POA: Diagnosis not present

## 2022-05-07 DIAGNOSIS — Z9181 History of falling: Secondary | ICD-10-CM | POA: Diagnosis not present

## 2022-05-07 DIAGNOSIS — I10 Essential (primary) hypertension: Secondary | ICD-10-CM | POA: Diagnosis not present

## 2022-05-07 DIAGNOSIS — I351 Nonrheumatic aortic (valve) insufficiency: Secondary | ICD-10-CM | POA: Diagnosis not present

## 2022-05-07 DIAGNOSIS — G8929 Other chronic pain: Secondary | ICD-10-CM | POA: Diagnosis not present

## 2022-05-07 DIAGNOSIS — E43 Unspecified severe protein-calorie malnutrition: Secondary | ICD-10-CM | POA: Diagnosis not present

## 2022-05-07 DIAGNOSIS — M5030 Other cervical disc degeneration, unspecified cervical region: Secondary | ICD-10-CM | POA: Diagnosis not present

## 2022-05-07 DIAGNOSIS — I69392 Facial weakness following cerebral infarction: Secondary | ICD-10-CM | POA: Diagnosis not present

## 2022-05-07 DIAGNOSIS — Z9049 Acquired absence of other specified parts of digestive tract: Secondary | ICD-10-CM | POA: Diagnosis not present

## 2022-05-07 DIAGNOSIS — I700 Atherosclerosis of aorta: Secondary | ICD-10-CM | POA: Diagnosis not present

## 2022-05-08 DIAGNOSIS — G8929 Other chronic pain: Secondary | ICD-10-CM | POA: Diagnosis not present

## 2022-05-08 DIAGNOSIS — I700 Atherosclerosis of aorta: Secondary | ICD-10-CM | POA: Diagnosis not present

## 2022-05-08 DIAGNOSIS — Z7902 Long term (current) use of antithrombotics/antiplatelets: Secondary | ICD-10-CM | POA: Diagnosis not present

## 2022-05-08 DIAGNOSIS — M1990 Unspecified osteoarthritis, unspecified site: Secondary | ICD-10-CM | POA: Diagnosis not present

## 2022-05-08 DIAGNOSIS — U071 COVID-19: Secondary | ICD-10-CM | POA: Diagnosis not present

## 2022-05-08 DIAGNOSIS — R1310 Dysphagia, unspecified: Secondary | ICD-10-CM | POA: Diagnosis not present

## 2022-05-08 DIAGNOSIS — M5412 Radiculopathy, cervical region: Secondary | ICD-10-CM | POA: Diagnosis not present

## 2022-05-08 DIAGNOSIS — E785 Hyperlipidemia, unspecified: Secondary | ICD-10-CM | POA: Diagnosis not present

## 2022-05-08 DIAGNOSIS — I69320 Aphasia following cerebral infarction: Secondary | ICD-10-CM | POA: Diagnosis not present

## 2022-05-08 DIAGNOSIS — I351 Nonrheumatic aortic (valve) insufficiency: Secondary | ICD-10-CM | POA: Diagnosis not present

## 2022-05-08 DIAGNOSIS — E43 Unspecified severe protein-calorie malnutrition: Secondary | ICD-10-CM | POA: Diagnosis not present

## 2022-05-08 DIAGNOSIS — R7303 Prediabetes: Secondary | ICD-10-CM | POA: Diagnosis not present

## 2022-05-08 DIAGNOSIS — Z9049 Acquired absence of other specified parts of digestive tract: Secondary | ICD-10-CM | POA: Diagnosis not present

## 2022-05-08 DIAGNOSIS — I69391 Dysphagia following cerebral infarction: Secondary | ICD-10-CM | POA: Diagnosis not present

## 2022-05-08 DIAGNOSIS — Z981 Arthrodesis status: Secondary | ICD-10-CM | POA: Diagnosis not present

## 2022-05-08 DIAGNOSIS — Z993 Dependence on wheelchair: Secondary | ICD-10-CM | POA: Diagnosis not present

## 2022-05-08 DIAGNOSIS — I69392 Facial weakness following cerebral infarction: Secondary | ICD-10-CM | POA: Diagnosis not present

## 2022-05-08 DIAGNOSIS — Z9181 History of falling: Secondary | ICD-10-CM | POA: Diagnosis not present

## 2022-05-08 DIAGNOSIS — Z7982 Long term (current) use of aspirin: Secondary | ICD-10-CM | POA: Diagnosis not present

## 2022-05-08 DIAGNOSIS — H409 Unspecified glaucoma: Secondary | ICD-10-CM | POA: Diagnosis not present

## 2022-05-08 DIAGNOSIS — M5030 Other cervical disc degeneration, unspecified cervical region: Secondary | ICD-10-CM | POA: Diagnosis not present

## 2022-05-08 DIAGNOSIS — I69351 Hemiplegia and hemiparesis following cerebral infarction affecting right dominant side: Secondary | ICD-10-CM | POA: Diagnosis not present

## 2022-05-08 DIAGNOSIS — I10 Essential (primary) hypertension: Secondary | ICD-10-CM | POA: Diagnosis not present

## 2022-05-09 ENCOUNTER — Telehealth: Payer: Self-pay | Admitting: Family Medicine

## 2022-05-09 NOTE — Telephone Encounter (Signed)
Aware and verbalizes understanding.  

## 2022-05-09 NOTE — Telephone Encounter (Signed)
Calling to get verbal for new orders. Please call back

## 2022-05-09 NOTE — Telephone Encounter (Signed)
Extend home health OT once a week for 6 weeks

## 2022-05-09 NOTE — Telephone Encounter (Signed)
Lmtcb  What kind of verbal orders??

## 2022-05-10 ENCOUNTER — Other Ambulatory Visit: Payer: Self-pay

## 2022-05-10 ENCOUNTER — Emergency Department (HOSPITAL_COMMUNITY): Payer: Medicare Other

## 2022-05-10 ENCOUNTER — Observation Stay (HOSPITAL_COMMUNITY)
Admission: EM | Admit: 2022-05-10 | Discharge: 2022-05-12 | Disposition: A | Discharge status: Home Health | Payer: Medicare Other | Attending: Family Medicine | Admitting: Family Medicine

## 2022-05-10 DIAGNOSIS — R0789 Other chest pain: Secondary | ICD-10-CM | POA: Diagnosis not present

## 2022-05-10 DIAGNOSIS — H409 Unspecified glaucoma: Secondary | ICD-10-CM | POA: Diagnosis not present

## 2022-05-10 DIAGNOSIS — I10 Essential (primary) hypertension: Secondary | ICD-10-CM | POA: Insufficient documentation

## 2022-05-10 DIAGNOSIS — R1310 Dysphagia, unspecified: Secondary | ICD-10-CM | POA: Diagnosis not present

## 2022-05-10 DIAGNOSIS — Z981 Arthrodesis status: Secondary | ICD-10-CM | POA: Diagnosis not present

## 2022-05-10 DIAGNOSIS — I69351 Hemiplegia and hemiparesis following cerebral infarction affecting right dominant side: Secondary | ICD-10-CM | POA: Diagnosis not present

## 2022-05-10 DIAGNOSIS — I69320 Aphasia following cerebral infarction: Secondary | ICD-10-CM | POA: Diagnosis not present

## 2022-05-10 DIAGNOSIS — Z7982 Long term (current) use of aspirin: Secondary | ICD-10-CM | POA: Diagnosis not present

## 2022-05-10 DIAGNOSIS — Z8673 Personal history of transient ischemic attack (TIA), and cerebral infarction without residual deficits: Secondary | ICD-10-CM | POA: Insufficient documentation

## 2022-05-10 DIAGNOSIS — E43 Unspecified severe protein-calorie malnutrition: Secondary | ICD-10-CM | POA: Diagnosis not present

## 2022-05-10 DIAGNOSIS — Z9181 History of falling: Secondary | ICD-10-CM | POA: Diagnosis not present

## 2022-05-10 DIAGNOSIS — R000 Tachycardia, unspecified: Secondary | ICD-10-CM | POA: Diagnosis not present

## 2022-05-10 DIAGNOSIS — I700 Atherosclerosis of aorta: Secondary | ICD-10-CM | POA: Diagnosis not present

## 2022-05-10 DIAGNOSIS — N400 Enlarged prostate without lower urinary tract symptoms: Secondary | ICD-10-CM | POA: Diagnosis present

## 2022-05-10 DIAGNOSIS — E782 Mixed hyperlipidemia: Secondary | ICD-10-CM | POA: Diagnosis present

## 2022-05-10 DIAGNOSIS — J40 Bronchitis, not specified as acute or chronic: Secondary | ICD-10-CM

## 2022-05-10 DIAGNOSIS — I2510 Atherosclerotic heart disease of native coronary artery without angina pectoris: Secondary | ICD-10-CM | POA: Insufficient documentation

## 2022-05-10 DIAGNOSIS — M5030 Other cervical disc degeneration, unspecified cervical region: Secondary | ICD-10-CM | POA: Diagnosis not present

## 2022-05-10 DIAGNOSIS — I69392 Facial weakness following cerebral infarction: Secondary | ICD-10-CM | POA: Diagnosis not present

## 2022-05-10 DIAGNOSIS — Z79899 Other long term (current) drug therapy: Secondary | ICD-10-CM | POA: Diagnosis not present

## 2022-05-10 DIAGNOSIS — E785 Hyperlipidemia, unspecified: Secondary | ICD-10-CM | POA: Diagnosis not present

## 2022-05-10 DIAGNOSIS — G451 Carotid artery syndrome (hemispheric): Secondary | ICD-10-CM | POA: Diagnosis not present

## 2022-05-10 DIAGNOSIS — I471 Supraventricular tachycardia, unspecified: Principal | ICD-10-CM | POA: Insufficient documentation

## 2022-05-10 DIAGNOSIS — Z9049 Acquired absence of other specified parts of digestive tract: Secondary | ICD-10-CM | POA: Diagnosis not present

## 2022-05-10 DIAGNOSIS — R079 Chest pain, unspecified: Secondary | ICD-10-CM | POA: Diagnosis not present

## 2022-05-10 DIAGNOSIS — M5412 Radiculopathy, cervical region: Secondary | ICD-10-CM | POA: Diagnosis not present

## 2022-05-10 DIAGNOSIS — G8929 Other chronic pain: Secondary | ICD-10-CM | POA: Diagnosis not present

## 2022-05-10 DIAGNOSIS — R61 Generalized hyperhidrosis: Secondary | ICD-10-CM | POA: Diagnosis not present

## 2022-05-10 DIAGNOSIS — I69391 Dysphagia following cerebral infarction: Secondary | ICD-10-CM | POA: Diagnosis not present

## 2022-05-10 DIAGNOSIS — I779 Disorder of arteries and arterioles, unspecified: Secondary | ICD-10-CM | POA: Diagnosis present

## 2022-05-10 DIAGNOSIS — Z993 Dependence on wheelchair: Secondary | ICD-10-CM | POA: Diagnosis not present

## 2022-05-10 DIAGNOSIS — Z7902 Long term (current) use of antithrombotics/antiplatelets: Secondary | ICD-10-CM | POA: Diagnosis not present

## 2022-05-10 DIAGNOSIS — U071 COVID-19: Secondary | ICD-10-CM | POA: Diagnosis not present

## 2022-05-10 DIAGNOSIS — I63512 Cerebral infarction due to unspecified occlusion or stenosis of left middle cerebral artery: Secondary | ICD-10-CM | POA: Diagnosis not present

## 2022-05-10 DIAGNOSIS — I351 Nonrheumatic aortic (valve) insufficiency: Secondary | ICD-10-CM | POA: Diagnosis not present

## 2022-05-10 DIAGNOSIS — M1990 Unspecified osteoarthritis, unspecified site: Secondary | ICD-10-CM | POA: Diagnosis not present

## 2022-05-10 DIAGNOSIS — R7303 Prediabetes: Secondary | ICD-10-CM | POA: Diagnosis not present

## 2022-05-10 LAB — TROPONIN I (HIGH SENSITIVITY)
Troponin I (High Sensitivity): 12 ng/L (ref <18)
Troponin I (High Sensitivity): 30 ng/L — ABNORMAL HIGH (ref <18)
Troponin I (High Sensitivity): 84 ng/L — ABNORMAL HIGH (ref <18)

## 2022-05-10 LAB — BASIC METABOLIC PANEL
Anion gap: 11 (ref 5–15)
BUN: 23 mg/dL (ref 8–23)
CO2: 25 mmol/L (ref 22–32)
Calcium: 9.2 mg/dL (ref 8.9–10.3)
Chloride: 99 mmol/L (ref 98–111)
Creatinine, Ser: 0.97 mg/dL (ref 0.61–1.24)
GFR, Estimated: >60 mL/min (ref >60)
Glucose, Bld: 135 mg/dL — ABNORMAL HIGH (ref 70–99)
Potassium: 4 mmol/L (ref 3.5–5.1)
Sodium: 135 mmol/L (ref 135–145)

## 2022-05-10 LAB — CBC
HCT: 41.4 % (ref 39.0–52.0)
Hemoglobin: 13.3 g/dL (ref 13.0–17.0)
MCH: 29.9 pg (ref 26.0–34.0)
MCHC: 32.1 g/dL (ref 30.0–36.0)
MCV: 93 fL (ref 80.0–100.0)
Platelets: 274 10*3/uL (ref 150–400)
RBC: 4.45 MIL/uL (ref 4.22–5.81)
RDW: 14.2 % (ref 11.5–15.5)
WBC: 5.8 10*3/uL (ref 4.0–10.5)
nRBC: 0 % (ref 0.0–0.2)

## 2022-05-10 LAB — CBG MONITORING, ED: Glucose-Capillary: 125 mg/dL — ABNORMAL HIGH (ref 70–99)

## 2022-05-10 MED ORDER — FINASTERIDE 5 MG PO TABS
5.0000 mg | ORAL_TABLET | Freq: Every day | ORAL | Status: DC
  Administered 2022-05-10 – 2022-05-12 (×3): 5 mg via ORAL
  Filled 2022-05-10 (×3): qty 1

## 2022-05-10 MED ORDER — ONDANSETRON HCL 4 MG PO TABS: 4.0000 mg | ORAL_TABLET | Freq: Four times a day (QID) | ORAL | Status: DC | PRN

## 2022-05-10 MED ORDER — FAMOTIDINE 20 MG PO TABS
20.0000 mg | ORAL_TABLET | Freq: Two times a day (BID) | ORAL | Status: DC
  Administered 2022-05-10 – 2022-05-12 (×4): 20 mg via ORAL
  Filled 2022-05-10 (×4): qty 1

## 2022-05-10 MED ORDER — METOPROLOL TARTRATE 25 MG PO TABS
25.0000 mg | ORAL_TABLET | Freq: Once | ORAL | Status: AC
  Administered 2022-05-10: 25 mg via ORAL
  Filled 2022-05-10: qty 1

## 2022-05-10 MED ORDER — ASPIRIN 81 MG PO TBEC
81.0000 mg | DELAYED_RELEASE_TABLET | Freq: Every day | ORAL | Status: DC
  Administered 2022-05-11 – 2022-05-12 (×2): 81 mg via ORAL
  Filled 2022-05-10 (×2): qty 1

## 2022-05-10 MED ORDER — METOPROLOL TARTRATE 25 MG PO TABS
25.0000 mg | ORAL_TABLET | Freq: Two times a day (BID) | ORAL | Status: DC
  Administered 2022-05-11 – 2022-05-12 (×3): 25 mg via ORAL
  Filled 2022-05-10 (×3): qty 1

## 2022-05-10 MED ORDER — ALBUTEROL SULFATE (2.5 MG/3ML) 0.083% IN NEBU: 3.0000 mL | INHALATION_SOLUTION | Freq: Four times a day (QID) | RESPIRATORY_TRACT | Status: DC | PRN

## 2022-05-10 MED ORDER — TAMSULOSIN HCL 0.4 MG PO CAPS
0.4000 mg | ORAL_CAPSULE | Freq: Every day | ORAL | Status: DC
  Administered 2022-05-10 – 2022-05-11 (×2): 0.4 mg via ORAL
  Filled 2022-05-10 (×2): qty 1

## 2022-05-10 MED ORDER — ENOXAPARIN SODIUM 40 MG/0.4ML IJ SOSY
40.0000 mg | PREFILLED_SYRINGE | INTRAMUSCULAR | Status: DC
  Administered 2022-05-10 – 2022-05-11 (×2): 40 mg via SUBCUTANEOUS
  Filled 2022-05-10 (×2): qty 0.4

## 2022-05-10 MED ORDER — CLOPIDOGREL BISULFATE 75 MG PO TABS
75.0000 mg | ORAL_TABLET | Freq: Every day | ORAL | Status: DC
  Administered 2022-05-11 – 2022-05-12 (×2): 75 mg via ORAL
  Filled 2022-05-10 (×2): qty 1

## 2022-05-10 MED ORDER — ONDANSETRON HCL 4 MG/2ML IJ SOLN: 4.0000 mg | Freq: Four times a day (QID) | INTRAMUSCULAR | Status: DC | PRN

## 2022-05-10 MED ORDER — ATORVASTATIN CALCIUM 10 MG PO TABS
10.0000 mg | ORAL_TABLET | Freq: Every day | ORAL | Status: DC
  Administered 2022-05-10 – 2022-05-11 (×2): 10 mg via ORAL
  Filled 2022-05-10 (×2): qty 1

## 2022-05-10 NOTE — ED Triage Notes (Signed)
Pt grabbed at his chest at home. Pt has had previous stroke and words are hard to understand. [facility] stated pt was in SVT upon arrival at 170 and pt converted hisself in the truck.

## 2022-05-10 NOTE — ED Notes (Signed)
Pt swallowed po meds whole without issues.

## 2022-05-10 NOTE — ED Notes (Signed)
Pt unable to sign MSE 

## 2022-05-10 NOTE — H&P (Signed)
History and Physical    Patient: Paul Bradshaw [id number] DOB: 09/08/1925 DOA: 05/10/2022 DOS: the patient was seen and examined on 05/10/2022 PCP: Baruch Gouty, FNP  Patient coming from: Home  Chief Complaint:  Chief Complaint  Patient presents with   Chest Pain   HPI: Paul Bradshaw is a 88 y.o. 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  History complicated by patient's aphasia and history is obtained by patient's son on the medical record.  Patient's that his chest earlier today while at home and was presumed that he was having chest pain.  EMS was called evaluated by the.  He was noted to be in SVT with a heart rate of 170.  In route to the hospital, the patient spontaneously cardioverted.  Here in the emergency department, his heart rate has varied between 90 and 120.  His blood pressure has remained stable.  Review of Systems: As mentioned in the history of present illness. All other systems reviewed and are negative. Past Medical History:  Diagnosis Date   Aortic regurgitation    Moderate   Arthritis    BPH (benign prostatic hyperplasia)    Cervical disc disease    Cervical radiculopathy    Hyperlipidemia    Hypertension    Prediabetes 02/16/2021   Staphylococcus aureus bacteremia 08/09/2012   TEE negative for vegetation or thrombus February 2014   Stroke [facility]) 2005 or 2006   3   Symptomatic carotid artery stenosis with infarction [facility] 2005 or 2006   Status post right carotid endarterectomy   Past Surgical History:  Procedure Laterality Date   BACK SURGERY     CAROTID ENDARTERECTOMY     CATARACT EXTRACTION W/PHACO Left 02/15/2015   Procedure: CATARACT EXTRACTION PHACO AND INTRAOCULAR LENS PLACEMENT LEFT EYE CDE=30.93;  Surgeon: Tonny Branch, MD;  Location: [facility];  Service: Ophthalmology;  Laterality: Left;   CHOLECYSTECTOMY     EYE SURGERY      KIDNEY STONE SURGERY     LUMBAR LAMINECTOMY/DECOMPRESSION MICRODISCECTOMY Bilateral 01/23/2014   Procedure: LUMBAR LAMINECTOMY/DECOMPRESSION MICRODISCECTOMY 1 LEVEL L5-S1;  Surgeon: Charlie Pitter, MD;  Location: [facility];  Service: Neurosurgery;  Laterality: Bilateral;  LUMBAR LAMINECTOMY/DECOMPRESSION MICRODISCECTOMY 1 LEVEL L5-S1   TEE WITHOUT CARDIOVERSION N/A 08/12/2012   Procedure: TRANSESOPHAGEAL ECHOCARDIOGRAM (TEE);  Surgeon: Josue Hector, MD;  Location: [facility];  Service: Cardiovascular;  Laterality: N/A;   TEE WITHOUT CARDIOVERSION N/A 06/24/2013   Procedure: TRANSESOPHAGEAL ECHOCARDIOGRAM (TEE);  Surgeon: Satira Sark, MD;  Location: [facility];  Service: Endoscopy;  Laterality: N/A;   Social History:  reports that he has never smoked. He has never used smokeless tobacco. He reports that he does not drink alcohol and does not use drugs.  Allergies  Allergen Reactions   Amlodipine Swelling    Swelling of lips.   Lisinopril Other (See Comments)    Elevated BP higher & causes a burning feeling on the inside.     Family History  Problem Relation Age of Onset   Emphysema Father    Alzheimer's disease Mother    Heart disease Brother    Heart attack Brother    COPD Son     Prior to Admission medications   Medication Sig Start Date End Date Taking? Authorizing Provider  acetaminophen (TYLENOL) 325 MG tablet Take 1-2 tablets (325-650 mg total) by mouth every 4 (four) hours as needed for  mild pain. 03/12/22   Love, Ivan Anchors, PA-C  albuterol (VENTOLIN HFA) 108 (90 Base) MCG/ACT inhaler Inhale 2 puffs into the lungs every 6 (six) hours as needed for shortness of breath or wheezing. 08/20/21   Loman Brooklyn, FNP  aspirin EC 81 MG tablet Take 1 tablet (81 mg total) by mouth daily with breakfast. 03/12/22   Love, Ivan Anchors, PA-C  atorvastatin (LIPITOR) 10 MG tablet Take 1 tablet (10 mg total) by mouth daily. 03/12/22   Love, Ivan Anchors, PA-C  brimonidine (ALPHAGAN) 0.2 %  ophthalmic solution Place 1 drop into both eyes 3 (three) times daily. 10/08/21   [provider]  ciprofloxacin (CIPRO) 500 MG tablet Take 1 tablet (500 mg total) by mouth 2 (two) times daily for 7 days. 05/05/22 05/12/22  Baruch Gouty, FNP  clopidogrel (PLAVIX) 75 MG tablet Take 1 tablet (75 mg total) by mouth daily with breakfast. 03/31/22   Rakes, Connye Burkitt, FNP  colchicine 0.6 MG tablet 2 tablets at pain onset, may repeat 1 tablet 1 hour later. No more then 3 tablets in 24 hours 05/02/22   Hassell Done, [name], FNP  dorzolamide-timolol (COSOPT) 22.3-6.8 MG/ML ophthalmic solution Place 1 drop into both eyes 2 (two) times daily. 10/08/21   [provider]  famotidine (PEPCID) 20 MG tablet TAKE ONE TABLET TWICE DAILY 05/02/22   Baruch Gouty, FNP  finasteride (PROSCAR) 5 MG tablet Take 1 tablet (5 mg total) by mouth daily. 03/12/22   Love, Ivan Anchors, PA-C  latanoprost (XALATAN) 0.005 % ophthalmic solution Place 1 drop into both eyes every evening. 01/31/22   [provider]  metoprolol tartrate (LOPRESSOR) 25 MG tablet TAKE 1/2 TABLET TWICE DAILY 05/02/22   Rakes, Connye Burkitt, FNP  Mouthwashes (MOUTH RINSE) LIQD solution 15 mLs by Mouth Rinse route 4 (four) times daily - after meals and at bedtime. 03/12/22   Love, Ivan Anchors, PA-C  Multiple Vitamins-Minerals (MULTIVITAMIN PO) Take 1 tablet by mouth daily.    [provider]  tamsulosin (FLOMAX) 0.4 MG CAPS capsule Take 0.4 mg by mouth daily. 03/27/22   [provider]  Xanthan Gum GEL Take 1 packet (15 g total) by mouth as needed. 03/12/22   Bary Leriche, PA-C    Physical Exam: Vitals:   05/10/22 1303 05/10/22 1400 05/10/22 1430 05/10/22 1500  BP:  120/62 121/68 (!) 109/58  Pulse:  100 91 92  Resp:  16 15 15   Temp: 98 F (36.7 C)     TempSrc: Oral     SpO2:  98% 98% 100%  Weight:      Height:       General: Elderly male. Awake and alert and oriented x3. No acute cardiopulmonary distress.  HEENT:  Normocephalic atraumatic.  Right and left ears normal in appearance.  Pupils equal, round, reactive to light. Extraocular muscles are intact. Sclerae anicteric and noninjected.  Moist mucosal membranes. No mucosal lesions.  Neck: Neck supple without lymphadenopathy. No carotid bruits. No masses palpated.  Cardiovascular: Tachycardic regular rate with normal S1-S2 sounds. No murmurs, rubs, gallops auscultated. No JVD.  Respiratory: Good respiratory effort with no wheezes, rales, rhonchi. Lungs clear to auscultation bilaterally.  No accessory muscle use. Abdomen: Soft, nontender, nondistended. Active bowel sounds. No masses or hepatosplenomegaly  Skin: No rashes, lesions, or ulcerations.  Dry, warm to touch. 2+ dorsalis pedis and radial pulses. Musculoskeletal: No calf or leg pain. All major joints not erythematous nontender.  No upper or lower joint deformation.  Good ROM.  No contractures  Psychiatric: Intact judgment and insight. Pleasant and cooperative. Neurologic: No focal neurological deficits. Strength is 5/5 and symmetric in upper and lower extremities.  Cranial nerves II through XII are grossly intact.  Data Reviewed: Results for orders placed or performed during the hospital encounter of 05/10/22 (from the past 24 hour(s))  Basic metabolic panel     Status: Abnormal   Collection Time: 05/10/22  1:02 PM  Result Value Ref Range   Sodium 135 135 - 145 mmol/L   Potassium 4.0 3.5 - 5.1 mmol/L   Chloride 99 98 - 111 mmol/L   CO2 25 22 - 32 mmol/L   Glucose, Bld 135 (H) 70 - 99 mg/dL   BUN 23 8 - 23 mg/dL   Creatinine, Ser 6.38 0.61 - 1.24 mg/dL   Calcium 9.2 8.9 - 75.6 mg/dL   GFR, Estimated >43 >32 mL/min   Anion gap 11 5 - 15  CBC     Status: None   Collection Time: 05/10/22  1:02 PM  Result Value Ref Range   WBC 5.8 4.0 - 10.5 K/uL   RBC 4.45 4.22 - 5.81 MIL/uL   Hemoglobin 13.3 13.0 - 17.0 g/dL   HCT 95.1 88.4 - 16.6 %   MCV 93.0 80.0 - 100.0 fL   MCH 29.9 26.0 - 34.0 pg    MCHC 32.1 30.0 - 36.0 g/dL   RDW 06.3 01.6 - 01.0 %   Platelets 274 150 - 400 K/uL   nRBC 0.0 0.0 - 0.2 %  Troponin I (High Sensitivity)     Status: None   Collection Time: 05/10/22  1:02 PM  Result Value Ref Range   Troponin I (High Sensitivity) 12 <18 ng/L  CBG monitoring, ED     Status: Abnormal   Collection Time: 05/10/22  1:05 PM  Result Value Ref Range   Glucose-Capillary 125 (H) 70 - 99 mg/dL  Troponin I (High Sensitivity)     Status: Abnormal   Collection Time: 05/10/22  2:43 PM  Result Value Ref Range   Troponin I (High Sensitivity) 30 (H) <18 ng/L   DG Chest Port 1 View  Result Date: 05/10/2022 CLINICAL DATA:  Acute chest pain. EXAM: PORTABLE CHEST 1 VIEW COMPARISON:  02/24/2022 and prior studies FINDINGS: The cardiomediastinal silhouette is unremarkable. There is no evidence of focal airspace disease, pulmonary edema, suspicious pulmonary nodule/mass, pleural effusion, or pneumothorax. No acute bony abnormalities are identified. IMPRESSION: No active disease. Electronically Signed   By: Harmon Pier M.D.   On: 05/10/2022 13:36     Assessment and Plan: No notes have been filed under this hospital service. Service: Hospitalist  Principal Problem:   SVT (supraventricular tachycardia) Active Problems:   BPH (benign prostatic hyperplasia)   History of CVA (cerebrovascular accident)   Mixed hyperlipidemia   Carotid artery disease (HCC)   Essential hypertension   Glaucoma  SVT with elevated troponin Obs on tele Rpt troponin q6hrs Echo in AM Increase lopressor to 25mg  BID HTN Lopressor 25mg  BID.  Watch sbp CAD H/o CVA with residual deficit  BPH Continue tamsulosin and proscar   Advance Care Planning:   Code Status: Prior DNR confirmed by patient's son  Consults: none  Family Communication: son by phone  Severity of Illness: The appropriate patient status for this patient is OBSERVATION. Observation status is judged to be reasonable and necessary in order to  provide the required intensity of service to ensure the patient's safety. The patient's presenting symptoms,  physical exam findings, and initial radiographic and laboratory data in the context of their medical condition is felt to place them at decreased risk for further clinical deterioration. Furthermore, it is anticipated that the patient will be medically stable for discharge from the hospital within 2 midnights of admission.   Author: Truett Mainland, DO 05/10/2022 3:38 PM  For on call review [email or web address].

## 2022-05-10 NOTE — ED Notes (Signed)
Son aware of pt admission.

## 2022-05-10 NOTE — ED Provider Notes (Signed)
[facility] EMERGENCY DEPARTMENT Provider Note   CSN: [id number] Arrival date & time: 05/10/22  1241     History {Add pertinent medical, surgical, social history, OB history to HPI:1} Chief Complaint  Patient presents with   Chest Pain    Paul Bradshaw is a 88 y.o. male.  Patient has a history of stroke and has difficulty communicating he was grabbing his chest as if he was having significant pain.  Paramedics stated that he had a heart rate that was 170 and in the truck it converted..  Patient has a history of elevated lipids and hypertension  The history is provided by the EMS personnel. No language interpreter was used.  Chest Pain Pain location:  L chest Pain quality: aching   Pain radiates to:  Does not radiate Pain severity:  Moderate Onset quality:  Sudden Timing:  Intermittent Chronicity:  New Context: not breathing   Relieved by:  Nothing Worsened by:  Nothing Ineffective treatments:  None tried Associated symptoms: no abdominal pain, no back pain, no cough, no fatigue and no headache        Home Medications Prior to Admission medications   Medication Sig Start Date End Date Taking? Authorizing Provider  acetaminophen (TYLENOL) 325 MG tablet Take 1-2 tablets (325-650 mg total) by mouth every 4 (four) hours as needed for mild pain. 03/12/22   Love, Evlyn Kanner, PA-C  albuterol (VENTOLIN HFA) 108 (90 Base) MCG/ACT inhaler Inhale 2 puffs into the lungs every 6 (six) hours as needed for shortness of breath or wheezing. 08/20/21   Gwenlyn Fudge, FNP  aspirin EC 81 MG tablet Take 1 tablet (81 mg total) by mouth daily with breakfast. 03/12/22   Love, Evlyn Kanner, PA-C  atorvastatin (LIPITOR) 10 MG tablet Take 1 tablet (10 mg total) by mouth daily. 03/12/22   Love, Evlyn Kanner, PA-C  brimonidine (ALPHAGAN) 0.2 % ophthalmic solution Place 1 drop into both eyes 3 (three) times daily. 10/08/21   [provider]  ciprofloxacin (CIPRO) 500 MG tablet Take 1 tablet (500 mg  total) by mouth 2 (two) times daily for 7 days. 05/05/22 05/12/22  Sonny Masters, FNP  clopidogrel (PLAVIX) 75 MG tablet Take 1 tablet (75 mg total) by mouth daily with breakfast. 03/31/22   Rakes, Doralee Albino, FNP  colchicine 0.6 MG tablet 2 tablets at pain onset, may repeat 1 tablet 1 hour later. No more then 3 tablets in 24 hours 05/02/22   Daphine Deutscher, [name], FNP  dorzolamide-timolol (COSOPT) 22.3-6.8 MG/ML ophthalmic solution Place 1 drop into both eyes 2 (two) times daily. 10/08/21   [provider]  famotidine (PEPCID) 20 MG tablet TAKE ONE TABLET TWICE DAILY 05/02/22   Sonny Masters, FNP  finasteride (PROSCAR) 5 MG tablet Take 1 tablet (5 mg total) by mouth daily. 03/12/22   Love, Evlyn Kanner, PA-C  latanoprost (XALATAN) 0.005 % ophthalmic solution Place 1 drop into both eyes every evening. 01/31/22   [provider]  metoprolol tartrate (LOPRESSOR) 25 MG tablet TAKE 1/2 TABLET TWICE DAILY 05/02/22   Rakes, Doralee Albino, FNP  Mouthwashes (MOUTH RINSE) LIQD solution 15 mLs by Mouth Rinse route 4 (four) times daily - after meals and at bedtime. 03/12/22   Love, Evlyn Kanner, PA-C  Multiple Vitamins-Minerals (MULTIVITAMIN PO) Take 1 tablet by mouth daily.    [provider]  tamsulosin (FLOMAX) 0.4 MG CAPS capsule Take 0.4 mg by mouth daily. 03/27/22   [provider]  Xanthan Gum GEL Take 1  packet (15 g total) by mouth as needed. 03/12/22   Love, Evlyn Kanner, PA-C      Allergies    Amlodipine and Lisinopril    Review of Systems   Review of Systems  Constitutional:  Negative for appetite change and fatigue.  HENT:  Negative for congestion, ear discharge and sinus pressure.   Eyes:  Negative for discharge.  Respiratory:  Negative for cough.   Cardiovascular:  Positive for chest pain.  Gastrointestinal:  Negative for abdominal pain and diarrhea.  Genitourinary:  Negative for frequency and hematuria.  Musculoskeletal:  Negative for back pain.  Skin:  Negative for rash.   Neurological:  Negative for seizures and headaches.  Psychiatric/Behavioral:  Negative for hallucinations.     Physical Exam Updated Vital Signs BP (!) 109/58   Pulse 92   Temp 98 F (36.7 C) (Oral)   Resp 15   Ht 5\' 8"  (1.727 m)   Wt 65.8 kg   SpO2 100%   BMI 22.05 kg/m  Physical Exam Vitals and nursing note reviewed.  Constitutional:      Appearance: He is well-developed.  HENT:     Head: Normocephalic.     Nose: Nose normal.  Eyes:     General: No scleral icterus.    Conjunctiva/sclera: Conjunctivae normal.  Neck:     Thyroid: No thyromegaly.  Cardiovascular:     Rate and Rhythm: Normal rate and regular rhythm.     Heart sounds: No murmur heard.    No friction rub. No gallop.  Pulmonary:     Breath sounds: No stridor. No wheezing or rales.  Chest:     Chest wall: No tenderness.  Abdominal:     General: There is no distension.     Tenderness: There is no abdominal tenderness. There is no rebound.  Musculoskeletal:        General: Normal range of motion.     Cervical back: Neck supple.  Lymphadenopathy:     Cervical: No cervical adenopathy.  Skin:    Findings: No erythema or rash.  Neurological:     Mental Status: He is alert and oriented to person, place, and time.     Motor: No abnormal muscle tone.     Coordination: Coordination normal.  Psychiatric:        Behavior: Behavior normal.     ED Results / Procedures / Treatments   Labs (all labs ordered are listed, but only abnormal results are displayed) Labs Reviewed  BASIC METABOLIC PANEL - Abnormal; Notable for the following components:      Result Value   Glucose, Bld 135 (*)    All other components within normal limits  CBG MONITORING, ED - Abnormal; Notable for the following components:   Glucose-Capillary 125 (*)    All other components within normal limits  TROPONIN I (HIGH SENSITIVITY) - Abnormal; Notable for the following components:   Troponin I (High Sensitivity) 30 (*)    All other  components within normal limits  CBC  TROPONIN I (HIGH SENSITIVITY)    EKG EKG Interpretation  Date/Time:  Saturday May 10 2022 12:56:46 EST Ventricular Rate:  98 PR Interval:  204 QRS Duration: 88 QT Interval:  360 QTC Calculation: 460 R Axis:   -49 Text Interpretation: Sinus rhythm LAD, consider left anterior fascicular block Low voltage, extremity leads Abnormal R-wave progression, late transition Confirmed by [id number] [phone]) on 05/10/2022 3:21:31 PM  Radiology DG Chest Port 1 View  Result Date: 05/10/2022 CLINICAL DATA:  Acute chest pain. EXAM: PORTABLE CHEST 1 VIEW COMPARISON:  02/24/2022 and prior studies FINDINGS: The cardiomediastinal silhouette is unremarkable. There is no evidence of focal airspace disease, pulmonary edema, suspicious pulmonary nodule/mass, pleural effusion, or pneumothorax. No acute bony abnormalities are identified. IMPRESSION: No active disease. Electronically Signed   By: Harmon Pier M.D.   On: 05/10/2022 13:36    Procedures Procedures  {Document cardiac monitor, telemetry assessment procedure when appropriate:1}  Medications Ordered in ED Medications - No data to display  ED Course/ Medical Decision Making/ A&P  Patient with tachycardia and chest pain.  The tachycardia has improved.  It seems like his pain is improved 2.  His second troponin is mildly elevated at 30.  He will be admitted to the hospitalist for observation and chest pain                         Medical Decision Making Amount and/or Complexity of Data Reviewed Labs: ordered. Radiology: ordered.   Chest pain and palpitations and tachycardia.  {Document critical care time when appropriate:1} {Document review of labs and clinical decision tools ie heart score, Chads2Vasc2 etc:1}  {Document your independent review of radiology images, and any outside records:1} {Document your discussion with family members, caretakers, and with consultants:1} {Document social  determinants of health affecting pt's care:1} {Document your decision making why or why not admission, treatments were needed:1} Final Clinical Impression(s) / ED Diagnoses Final diagnoses:  None    Rx / DC Orders ED Discharge Orders     None

## 2022-05-11 ENCOUNTER — Other Ambulatory Visit (HOSPITAL_COMMUNITY): Payer: Self-pay | Admitting: *Deleted

## 2022-05-11 ENCOUNTER — Observation Stay (HOSPITAL_BASED_OUTPATIENT_CLINIC_OR_DEPARTMENT_OTHER): Payer: Medicare Other

## 2022-05-11 DIAGNOSIS — I63512 Cerebral infarction due to unspecified occlusion or stenosis of left middle cerebral artery: Secondary | ICD-10-CM | POA: Diagnosis not present

## 2022-05-11 DIAGNOSIS — R079 Chest pain, unspecified: Secondary | ICD-10-CM

## 2022-05-11 DIAGNOSIS — I471 Supraventricular tachycardia, unspecified: Secondary | ICD-10-CM

## 2022-05-11 DIAGNOSIS — R778 Other specified abnormalities of plasma proteins: Secondary | ICD-10-CM | POA: Diagnosis not present

## 2022-05-11 LAB — RENAL FUNCTION PANEL
Albumin: 3 g/dL — ABNORMAL LOW (ref 3.5–5.0)
Anion gap: 10 (ref 5–15)
BUN: 23 mg/dL (ref 8–23)
CO2: 24 mmol/L (ref 22–32)
Calcium: 8.7 mg/dL — ABNORMAL LOW (ref 8.9–10.3)
Chloride: 104 mmol/L (ref 98–111)
Creatinine, Ser: 0.78 mg/dL (ref 0.61–1.24)
GFR, Estimated: >60 mL/min (ref >60)
Glucose, Bld: 113 mg/dL — ABNORMAL HIGH (ref 70–99)
Phosphorus: 3.5 mg/dL (ref 2.5–4.6)
Potassium: 3.4 mmol/L — ABNORMAL LOW (ref 3.5–5.1)
Sodium: 138 mmol/L (ref 135–145)

## 2022-05-11 LAB — ECHOCARDIOGRAM COMPLETE
AR max vel: 1.48 cm2
AV Area VTI: 1.43 cm2
AV Area mean vel: 1.59 cm2
AV Mean grad: 9 mmHg
AV Peak grad: 18.2 mmHg
Ao pk vel: 2.13 m/s
Area-P 1/2: 2.54 cm2
Height: 68 in
MV VTI: 2 cm2
P 1/2 time: 499 msec
S' Lateral: 1.7 cm
Weight: 2320 oz

## 2022-05-11 LAB — MAGNESIUM: Magnesium: 1.6 mg/dL — ABNORMAL LOW (ref 1.7–2.4)

## 2022-05-11 LAB — TROPONIN I (HIGH SENSITIVITY)
Troponin I (High Sensitivity): 117 ng/L (ref <18)
Troponin I (High Sensitivity): 87 ng/L — ABNORMAL HIGH (ref <18)

## 2022-05-11 LAB — TSH: TSH: 3.874 u[IU]/mL (ref 0.350–4.500)

## 2022-05-11 MED ORDER — MAGNESIUM SULFATE 2 GM/50ML IV SOLN: 2.0000 g | Freq: Once | INTRAVENOUS | Status: DC

## 2022-05-11 MED ORDER — POTASSIUM CHLORIDE CRYS ER 20 MEQ PO TBCR
40.0000 meq | EXTENDED_RELEASE_TABLET | ORAL | Status: AC
  Administered 2022-05-11 (×2): 40 meq via ORAL
  Filled 2022-05-11 (×2): qty 2

## 2022-05-11 MED ORDER — POTASSIUM CHLORIDE 10 MEQ/100ML IV SOLN: 10.0000 meq | INTRAVENOUS | Status: DC

## 2022-05-11 MED ORDER — MAGNESIUM SULFATE 4 GM/100ML IV SOLN
4.0000 g | Freq: Once | INTRAVENOUS | Status: AC
  Administered 2022-05-11: 4 g via INTRAVENOUS
  Filled 2022-05-11: qty 100

## 2022-05-11 NOTE — Progress Notes (Signed)
PROGRESS NOTE     Paul Bradshaw, is a 88 y.o. male, DOB - [birth year], PIR:[id number]  Admit date - 05/10/2022   Admitting Physician Levie Heritage, DO  Outpatient Primary MD for the patient is Rakes, Doralee Albino, FNP  LOS - 0  Chief Complaint  Patient presents with   Chest Pain        Brief Narrative:   88 y.o. male with medical history significant of h/o CVA with residual deficits of right arm weakness and aphasia, hypertension, prediabetes, coronary artery disease admitted on 05/10/22 with SVT in the setting of Hypokalemia and Hypomagnesemia    -Assessment and Plan: 1) SVT with elevated troponins in the setting of hypokalemia and hypomagnesemia -Patient had chest discomfort most likely due to SVT/tachyarrhythmia -Elevated troponin is most likely due to demand ischemia in the setting of tachyarrhythmia -Replace potassium and magnesium -Appears back in sinus rhythm at this time 12-lead EKG sinus rhythm 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, it was also present back on 02/07/22 -Repeat echo pending   2)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 -Continue modified diet--- Dysphagia 2 (puree);Necktar-thick liquid Medication Administration: Crushed with puree -on 02/17/22 Patient was advised to continue  Aspirin 81 mg daily along with Plavix 75 mg daily for 90 days then after that STOP the Plavix  and continue ONLY Aspirin 81 mg daily indefinitely--for secondary stroke Prevention (Per The multicenter SAMMPRIS trial)  -So around May 23, 2022 patient can stop Plavix and just continue aspirin monotherapy -Continue Lipitor  3)BPH with LUTs-continue Flomax and finasteride  4)Social/Ethics--- as per patient's son and daughter in law patient remains at DNR/DNI  5) neurocognitive deficits--- as per family members patient becoming more forgetful and having difficulty recognizing family -Get palliative care consult -May benefit  from ongoing palliative care involvement as outpatient  6)HTN-stable continue Lopressor  Disposition/Need for in-Hospital Stay- patient unable to be discharged at this time due to -elevated troponins, episode of SVT with tachycardia and chest discomfort and electrolyte abnormalities requiring replacements-  Disposition: The patient is from: Home              Anticipated d/c is to: Home with [facility]              Anticipated d/c date is: 1 day              Patient currently is not medically stable to d/c. Barriers: Not Clinically Stable-   Code Status :  -  Code Status: DNR   Family Communication:   son and daughter-in-law visited  DVT Prophylaxis  :   - SCDs   enoxaparin (LOVENOX) injection 40 mg Start: 05/10/22 2200   Lab Results  Component Value Date   PLT 274 05/10/2022    Inpatient Medications  Scheduled Meds:  aspirin EC  81 mg Oral Q breakfast   atorvastatin  10 mg Oral q1800   clopidogrel  75 mg Oral Q breakfast   enoxaparin (LOVENOX) injection  40 mg Subcutaneous Q24H   famotidine  20 mg Oral BID   finasteride  5 mg Oral Daily   metoprolol tartrate  25 mg Oral BID   tamsulosin  0.4 mg Oral QHS   Continuous Infusions:  magnesium sulfate bolus IVPB     potassium chloride     PRN Meds:.albuterol, ondansetron **OR** ondansetron (ZOFRAN) IV   Anti-infectives (From admission, onward)    None         Subjective: Paul Cliche  Bradshaw today has no fevers, no emesis,  No chest pain,  -No further chest pains -Son and daughter-in-law visited  Objective: Vitals:   05/10/22 2200 05/11/22 0018 05/11/22 0359 05/11/22 0422  BP: (!) 102/46 (!) 99/40 (!) 108/57 (!) 152/59  Pulse: 67 76 77 73  Resp: 20   18  Temp: 97.9 F (36.6 C) 98.5 F (36.9 C) 98.4 F (36.9 C) (!) 97.5 F (36.4 C)  TempSrc: Oral Oral Oral Oral  SpO2: 99% 99% 97% 99%  Weight:      Height:       No intake or output data in the 24 hours ending 05/11/22 1349 Filed Weights   05/10/22 1300  Weight: 65.8  kg    Physical Exam  Gen:- Awake Alert, in no acute distress HEENT:- [place].AT, No sclera icterus Neck-Supple Neck,No JVD,.  Lungs-  CTAB , fair symmetrical air movement CV- S1, S2 normal, regular  Abd-  +ve B.Sounds, Abd Soft, No tenderness,    Extremity/Skin:- No  edema, pedal pulses present  Psych-affect is flat, oriented x 1 Neuro-facial asymmetry, motor aphasia, right-sided hemiparesis no additional new focal deficits, no tremors  Data Reviewed: I have personally reviewed following labs and imaging studies  CBC: Recent Labs  Lab 05/10/22 1302  WBC 5.8  HGB 13.3  HCT 41.4  MCV 93.0  PLT 274   Basic Metabolic Panel: Recent Labs  Lab 05/10/22 1302 05/11/22 0536  NA 135 138  K 4.0 3.4*  CL 99 104  CO2 25 24  GLUCOSE 135* 113*  BUN 23 23  CREATININE 0.97 0.78  CALCIUM 9.2 8.7*  MG  --  1.6*  PHOS  --  3.5   GFR: Estimated Creatinine Clearance: 52.5 mL/min (by C-G formula based on SCr of 0.78 mg/dL). Liver Function Tests: Recent Labs  Lab 05/11/22 0536  ALBUMIN 3.0*   Radiology Studies: [facility] Chest Port 1 View  Result Date: 05/10/2022 CLINICAL DATA:  Acute chest pain. EXAM: PORTABLE CHEST 1 VIEW COMPARISON:  02/24/2022 and prior studies FINDINGS: The cardiomediastinal silhouette is unremarkable. There is no evidence of focal airspace disease, pulmonary edema, suspicious pulmonary nodule/mass, pleural effusion, or pneumothorax. No acute bony abnormalities are identified. IMPRESSION: No active disease. Electronically Signed   By: Harmon Pier M.D.   On: 05/10/2022 13:36     Scheduled Meds:  aspirin EC  81 mg Oral Q breakfast   atorvastatin  10 mg Oral q1800   clopidogrel  75 mg Oral Q breakfast   enoxaparin (LOVENOX) injection  40 mg Subcutaneous Q24H   famotidine  20 mg Oral BID   finasteride  5 mg Oral Daily   metoprolol tartrate  25 mg Oral BID   tamsulosin  0.4 mg Oral QHS   Continuous Infusions:  magnesium sulfate bolus IVPB     potassium chloride       LOS: 0 days    Shon Hale M.D on 05/11/2022 at 1:49 PM  Go to [email or web address] - for contact info  [facility] - Office  [phone]  If 7PM-7AM, please contact night-coverage [email or web address] 05/11/2022, 1:49 PM

## 2022-05-11 NOTE — Progress Notes (Signed)
*  PRELIMINARY RESULTS* Echocardiogram 2D Echocardiogram has been performed.  Stacey Drain 05/11/2022, 4:39 PM

## 2022-05-11 NOTE — Progress Notes (Addendum)
Transferred patient to [facility] with 1 person stand and pivot assist.  Patient tolerated transfer and proceeded to void and have a BM.  Patient became unresponsive and slumped to the right and became clammy.  Staff with patient entire episode, and staff 2 person lifted patient back to bed.  Patient alert once lying down.  VS documented, ECG strips reviewed and showed a burst of ST @ 120's.  No bradycardia noted in any leads, however 3 single PVC's were present during this time.  On call provider notified.

## 2022-05-11 NOTE — Progress Notes (Signed)
Date and time results received: 05/11/22 01:11  Test: Troponin Critical Value: 117  Name of Provider Notified: Thomes Dinning

## 2022-05-11 NOTE — TOC Progression Note (Signed)
  Transition of Care [facility]) Screening Note   Patient Details  Name: Paul Bradshaw Date of Birth: Oct 23, 1925   Transition of Care [facility]) CM/SW Contact:    Leitha Bleak, RN Phone Number: 05/11/2022, 4:00 PM  DC home later, pending echo results.   Transition of Care Department [facility]) has reviewed patient and no TOC needs have been identified at this time. We will continue to monitor patient advancement through interdisciplinary progression rounds. If new patient transition needs arise, please place a TOC consult.    Expected Discharge Plan: Home/Self Care Barriers to Discharge: Continued Medical Work up  Expected Discharge Plan and Services Expected Discharge Plan: Home/Self Care

## 2022-05-11 NOTE — Care Management Obs Status (Signed)
MEDICARE OBSERVATION STATUS NOTIFICATION   Patient Details  Name: Paul Bradshaw MRN: [id number] Date of Birth: 25-Aug-1925   Medicare Observation Status Notification Given:  Yes    Leitha Bleak, RN 05/11/2022, 3:58 PM

## 2022-05-12 DIAGNOSIS — Z8673 Personal history of transient ischemic attack (TIA), and cerebral infarction without residual deficits: Secondary | ICD-10-CM | POA: Diagnosis not present

## 2022-05-12 DIAGNOSIS — G451 Carotid artery syndrome (hemispheric): Secondary | ICD-10-CM | POA: Diagnosis not present

## 2022-05-12 DIAGNOSIS — I10 Essential (primary) hypertension: Secondary | ICD-10-CM | POA: Diagnosis not present

## 2022-05-12 DIAGNOSIS — Z79899 Other long term (current) drug therapy: Secondary | ICD-10-CM | POA: Diagnosis not present

## 2022-05-12 DIAGNOSIS — I471 Supraventricular tachycardia, unspecified: Secondary | ICD-10-CM | POA: Diagnosis not present

## 2022-05-12 DIAGNOSIS — Z7982 Long term (current) use of aspirin: Secondary | ICD-10-CM | POA: Diagnosis not present

## 2022-05-12 DIAGNOSIS — Z7902 Long term (current) use of antithrombotics/antiplatelets: Secondary | ICD-10-CM | POA: Diagnosis not present

## 2022-05-12 DIAGNOSIS — H409 Unspecified glaucoma: Secondary | ICD-10-CM | POA: Diagnosis not present

## 2022-05-12 DIAGNOSIS — I2510 Atherosclerotic heart disease of native coronary artery without angina pectoris: Secondary | ICD-10-CM | POA: Diagnosis not present

## 2022-05-12 LAB — RENAL FUNCTION PANEL
Albumin: 3.3 g/dL — ABNORMAL LOW (ref 3.5–5.0)
Anion gap: 6 (ref 5–15)
BUN: 20 mg/dL (ref 8–23)
CO2: 25 mmol/L (ref 22–32)
Calcium: 8.8 mg/dL — ABNORMAL LOW (ref 8.9–10.3)
Chloride: 105 mmol/L (ref 98–111)
Creatinine, Ser: 0.73 mg/dL (ref 0.61–1.24)
GFR, Estimated: >60 mL/min (ref >60)
Glucose, Bld: 97 mg/dL (ref 70–99)
Phosphorus: 3 mg/dL (ref 2.5–4.6)
Potassium: 4.2 mmol/L (ref 3.5–5.1)
Sodium: 136 mmol/L (ref 135–145)

## 2022-05-12 LAB — MAGNESIUM: Magnesium: 2.3 mg/dL (ref 1.7–2.4)

## 2022-05-12 LAB — TROPONIN I (HIGH SENSITIVITY): Troponin I (High Sensitivity): 32 ng/L — ABNORMAL HIGH (ref <18)

## 2022-05-12 MED ORDER — METOPROLOL TARTRATE 25 MG PO TABS: 25.0000 mg | ORAL_TABLET | Freq: Two times a day (BID) | ORAL | 5 refills | Status: DC

## 2022-05-12 MED ORDER — ASPIRIN 81 MG PO TBEC: 81.0000 mg | DELAYED_RELEASE_TABLET | Freq: Every day | ORAL | 3 refills | Status: DC

## 2022-05-12 MED ORDER — ATORVASTATIN CALCIUM 20 MG PO TABS: 20.0000 mg | ORAL_TABLET | Freq: Every day | ORAL | 3 refills | Status: DC

## 2022-05-12 MED ORDER — ALBUTEROL SULFATE HFA 108 (90 BASE) MCG/ACT IN AERS: 2.0000 | INHALATION_SPRAY | Freq: Four times a day (QID) | RESPIRATORY_TRACT | 2 refills | Status: DC | PRN

## 2022-05-12 NOTE — Discharge Summary (Signed)
Paul Bradshaw, is a 88 y.o. male  DOB Oct 19, 1925  MRN [id number].  Admission date:  05/10/2022  Admitting Physician  Levie Heritage, DO  Discharge Date:  05/12/2022   Primary MD  Sonny Masters, FNP  Recommendations for primary care physician for things to follow:   1)Continue modified diet--- Dysphagia 2 (puree);Necktar-thick liquid Medication Administration: Crushed with puree  2)-on 02/17/22 Patient was advised to continue  Aspirin 81 mg daily along with Plavix 75 mg daily for 90 days then after that STOP the Plavix  and continue ONLY Aspirin 81 mg daily indefinitely--for secondary stroke Prevention  -So around May 23, 2022 patient can stop Plavix and just continue aspirin 81 mg monotherapy -Please take atorvastatin/Lipitor as prescribed to prevent strokes  3)You will continue to get physical therapy, Occupational Therapy and speech therapy at home as previously discussed--   Admission Diagnosis  SVT (supraventricular tachycardia) [I47.10]   Discharge Diagnosis  SVT (supraventricular tachycardia) [I47.10]    Principal Problem:   SVT (supraventricular tachycardia) Active Problems:   BPH (benign prostatic hyperplasia)   History of CVA (cerebrovascular accident)   Mixed hyperlipidemia   Carotid artery disease (HCC)   Essential hypertension   Glaucoma      Past Medical History:  Diagnosis Date   Aortic regurgitation    Moderate   Arthritis    BPH (benign prostatic hyperplasia)    Cervical disc disease    Cervical radiculopathy    Hyperlipidemia    Hypertension    Prediabetes 02/16/2021   Staphylococcus aureus bacteremia 08/09/2012   TEE negative for vegetation or thrombus February 2014   Stroke [facility]) 2005 or 2006   3   Symptomatic carotid artery stenosis with infarction (HCC) 2005 or 2006   Status post right carotid endarterectomy    Past Surgical History:  Procedure  Laterality Date   BACK SURGERY     CAROTID ENDARTERECTOMY     CATARACT EXTRACTION W/PHACO Left 02/15/2015   Procedure: CATARACT EXTRACTION PHACO AND INTRAOCULAR LENS PLACEMENT LEFT EYE CDE=30.93;  Surgeon: Gemma Payor, MD;  Location: [facility]S;  Service: Ophthalmology;  Laterality: Left;   CHOLECYSTECTOMY     EYE SURGERY     KIDNEY STONE SURGERY     LUMBAR LAMINECTOMY/DECOMPRESSION MICRODISCECTOMY Bilateral 01/23/2014   Procedure: LUMBAR LAMINECTOMY/DECOMPRESSION MICRODISCECTOMY 1 LEVEL L5-S1;  Surgeon: Temple Pacini, MD;  Location: [facility];  Service: Neurosurgery;  Laterality: Bilateral;  LUMBAR LAMINECTOMY/DECOMPRESSION MICRODISCECTOMY 1 LEVEL L5-S1   TEE WITHOUT CARDIOVERSION N/A 08/12/2012   Procedure: TRANSESOPHAGEAL ECHOCARDIOGRAM (TEE);  Surgeon: Wendall Stade, MD;  Location: [facility];  Service: Cardiovascular;  Laterality: N/A;   TEE WITHOUT CARDIOVERSION N/A 06/24/2013   Procedure: TRANSESOPHAGEAL ECHOCARDIOGRAM (TEE);  Surgeon: Jonelle Sidle, MD;  Location: [facility];  Service: Endoscopy;  Laterality: N/A;     HPI  from the history and physical done on the day of admission:   HPI: Paul Bradshaw is a 88 y.o. male with medical history significant of h/o CVA with  residual deficits of right arm weakness and aphasia, hypertension, prediabetes, coronary artery disease.  History complicated by patient's aphasia and history is obtained by patient's son on the medical record.  Patient's that his chest earlier today while at home and was presumed that he was having chest pain.  EMS was called evaluated by the.  He was noted to be in SVT with a heart rate of 170.  In route to the hospital, the patient spontaneously cardioverted.  Here in the emergency department, his heart rate has varied between 90 and 120.  His blood pressure has remained stable.   Review of Systems: As mentioned in the history of present illness. All other systems reviewed and are negative.    Hospital Course:    Brief Narrative:   88 y.o. male with medical history significant of h/o CVA with residual deficits of right arm weakness and aphasia, hypertension, prediabetes, coronary artery disease admitted on 05/10/22 with SVT in the setting of Hypokalemia and Hypomagnesemia     -Assessment and Plan: 1) SVT with elevated troponins in the setting of hypokalemia and hypomagnesemia -Patient had chest discomfort most likely due to SVT/tachyarrhythmia -Elevated troponin is most likely due to demand ischemia in the setting of tachyarrhythmia -Replaced potassium and magnesium - back in sinus rhythm at this time 12-lead EKG sinus rhythm 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, it was also present back on 02/07/22 -Repeat echo with EF of 60 to 65%, no significant wall motion abnormalities, grade 1 diastolic dysfunction noted, no aortic stenosis     2)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 -Continue modified diet--- Dysphagia 2 (puree);Necktar-thick liquid Medication Administration: Crushed with puree -on 02/17/22 Patient was advised to continue  Aspirin 81 mg daily along with Plavix 75 mg daily for 90 days then after that STOP the Plavix  and continue ONLY Aspirin 81 mg daily indefinitely--for secondary stroke Prevention (Per The multicenter SAMMPRIS trial)  -So around May 23, 2022 patient can stop Plavix and just continue aspirin monotherapy -Continue Lipitor   3)BPH with LUTs-continue Flomax and finasteride   4)Social/Ethics--- as per patient's son and daughter in law patient remains at DNR/DNI   5) neurocognitive deficits--- as per family members patient becoming more forgetful and having difficulty recognizing family --May benefit from ongoing palliative care involvement as outpatient PCP can arrange as outpatient when family is ready   6)HTN-stable continue Lopressor   Disposition: The patient is from: Home              Anticipated d/c  is to: Home with [facility]   Discharge Condition: Stable  Follow UP--PCP  Diet and Activity recommendation:  As advised  Discharge Instructions    Discharge Instructions     Call MD for:  difficulty breathing, headache or visual disturbances   Complete by: As directed    Call MD for:  persistant dizziness or light-headedness   Complete by: As directed    Call MD for:  persistant nausea and vomiting   Complete by: As directed    Call MD for:  temperature >100.4   Complete by: As directed    Diet - low sodium heart healthy   Complete by: As directed    Continue modified diet--- Dysphagia 2 (puree);Necktar-thick liquid Medication Administration: Crushed with puree   Discharge instructions   Complete by: As directed    1)Continue modified diet--- Dysphagia 2 (puree);Necktar-thick liquid Medication Administration: Crushed with puree  2)-on 02/17/22 Patient was  advised to continue  Aspirin 81 mg daily along with Plavix 75 mg daily for 90 days then after that STOP the Plavix  and continue ONLY Aspirin 81 mg daily indefinitely--for secondary stroke Prevention  -So around May 23, 2022 patient can stop Plavix and just continue aspirin 81 mg monotherapy -Please take atorvastatin/Lipitor as prescribed to prevent strokes  3)You will continue to get physical therapy, Occupational Therapy and speech therapy at home as previously discussed--   Increase activity slowly   Complete by: As directed        Discharge Medications     Allergies as of 05/12/2022       Reactions   Amlodipine Swelling   Swelling of lips.   Lisinopril Other (See Comments)   Elevated BP higher & causes a burning feeling on the inside.         Medication List     STOP taking these medications    ciprofloxacin 500 MG tablet Commonly known as: Cipro   dorzolamide-timolol 2-0.5 % ophthalmic solution Commonly known as: COSOPT   finasteride 5 MG tablet Commonly known as: PROSCAR   mouth rinse Liqd  solution   SimplyThick Easy Mix Gel Generic drug: Xanthan Gum   tamsulosin 0.4 MG Caps capsule Commonly known as: FLOMAX       TAKE these medications    acetaminophen 325 MG tablet Commonly known as: TYLENOL Take 1-2 tablets (325-650 mg total) by mouth every 4 (four) hours as needed for mild pain.   albuterol 108 (90 Base) MCG/ACT inhaler Commonly known as: VENTOLIN HFA Inhale 2 puffs into the lungs every 6 (six) hours as needed for shortness of breath or wheezing.   aspirin EC 81 MG tablet Take 1 tablet (81 mg total) by mouth daily with breakfast.   atorvastatin 20 MG tablet Commonly known as: LIPITOR Take 1 tablet (20 mg total) by mouth daily. What changed:  medication strength how much to take   brimonidine 0.2 % ophthalmic solution Commonly known as: ALPHAGAN Place 1 drop into both eyes 3 (three) times daily.   clopidogrel 75 MG tablet Commonly known as: PLAVIX Take 1 tablet (75 mg total) by mouth daily with breakfast.   famotidine 20 MG tablet Commonly known as: PEPCID TAKE ONE TABLET TWICE DAILY   latanoprost 0.005 % ophthalmic solution Commonly known as: XALATAN Place 1 drop into both eyes every evening.   metoprolol tartrate 25 MG tablet Commonly known as: LOPRESSOR Take 1 tablet (25 mg total) by mouth 2 (two) times daily. What changed: how much to take        Major procedures and Radiology Reports - PLEASE review detailed and final reports for all details, in brief -   ECHOCARDIOGRAM COMPLETE  Result Date: 05/11/2022    ECHOCARDIOGRAM REPORT   Patient Name:   Paul Bradshaw Date of Exam: 05/11/2022 Medical Rec #:  [id number]     Height:       68.0 in Accession #:    [id number]    Weight:       145.0 lb Date of Birth:  1926/02/09     BSA:          1.783 m Patient Age:    89 years      BP:           152/59 mmHg Patient Gender: M             HR:           73 bpm. Exam  [facility] Procedure: 2D Echo, Cardiac Doppler and Color Doppler  Indications:    Elevated Troponin  History:        Patient has prior history of Echocardiogram examinations, most                 recent 02/08/2022. Stroke; Risk Factors:Dyslipidemia,                 Hypertension and Prediabetes (From Hx).  Sonographer:    Celesta Gentile RCS Referring Phys: [phone] JACOB J STINSON  Sonographer Comments: No subcostal window. IMPRESSIONS  1. Left ventricular ejection fraction, by estimation, is 60 to 65%. The left ventricle has normal function. The left ventricle has no regional wall motion abnormalities. Left ventricular diastolic parameters are consistent with Grade I diastolic dysfunction (impaired relaxation). Elevated left ventricular end-diastolic pressure.  2. Right ventricular systolic function is normal. The right ventricular size is normal.  3. The mitral valve is degenerative. No evidence of mitral valve regurgitation. No evidence of mitral stenosis. Moderate mitral annular calcification.  4. The aortic valve is tricuspid. There is moderate calcification of the aortic valve. There is moderate thickening of the aortic valve. Aortic valve regurgitation is mild to moderate. Aortic valve sclerosis/calcification is present, without any evidence of aortic stenosis. Aortic regurgitation PHT measures 499 msec.  5. Aortic dilatation noted. There is mild dilatation of the aortic root, measuring 43 mm.  6. The inferior vena cava is normal in size with greater than 50% respiratory variability, suggesting right atrial pressure of 3 mmHg. FINDINGS  Left Ventricle: Left ventricular ejection fraction, by estimation, is 60 to 65%. The left ventricle has normal function. The left ventricle has no regional wall motion abnormalities. The left ventricular internal cavity size was normal in size. There is  no left ventricular hypertrophy. Left ventricular diastolic parameters are consistent with Grade I diastolic dysfunction (impaired relaxation). Elevated left ventricular end-diastolic pressure.  Right Ventricle: The right ventricular size is normal. No increase in right ventricular wall thickness. Right ventricular systolic function is normal. Left Atrium: Left atrial size was normal in size. Right Atrium: Right atrial size was normal in size. Pericardium: There is no evidence of pericardial effusion. Mitral Valve: The mitral valve is degenerative in appearance. There is moderate thickening of the mitral valve leaflet(s). There is mild calcification of the mitral valve leaflet(s). Moderate mitral annular calcification. No evidence of mitral valve regurgitation. No evidence of mitral valve stenosis. MV peak gradient, 7.8 mmHg. The mean mitral valve gradient is 2.0 mmHg. Tricuspid Valve: The tricuspid valve is normal in structure. Tricuspid valve regurgitation is trivial. No evidence of tricuspid stenosis. Aortic Valve: The aortic valve is tricuspid. There is moderate calcification of the aortic valve. There is moderate thickening of the aortic valve. Aortic valve regurgitation is mild to moderate. Aortic regurgitation PHT measures 499 msec. Aortic valve sclerosis/calcification is present, without any evidence of aortic stenosis. Aortic valve mean gradient measures 9.0 mmHg. Aortic valve peak gradient measures 18.2 mmHg. Aortic valve area, by VTI measures 1.43 cm. Pulmonic Valve: The pulmonic valve was normal in structure. Pulmonic valve regurgitation is not visualized. No evidence of pulmonic stenosis. Aorta: Aortic dilatation noted. There is mild dilatation of the aortic root, measuring 43 mm. Venous: The inferior vena cava is normal in size with greater than 50% respiratory variability, suggesting right atrial pressure of 3 mmHg. IAS/Shunts: No atrial level shunt detected by color flow Doppler.  LEFT VENTRICLE PLAX 2D LVIDd:  3.50 cm   Diastology LVIDs:         1.70 cm   LV e' medial:    4.24 cm/s LV PW:         1.30 cm   LV E/e' medial:  14.7 LV IVS:        1.00 cm   LV e' lateral:   4.13 cm/s  LVOT diam:     1.90 cm   LV E/e' lateral: 15.1 LV SV:         70 LV SV Index:   39 LVOT Area:     2.84 cm  RIGHT VENTRICLE RV S prime:     10.90 cm/s TAPSE (M-mode): 2.2 cm LEFT ATRIUM             Index        RIGHT ATRIUM           Index LA diam:        4.50 cm 2.52 cm/m   RA Area:     14.10 cm LA Vol (A2C):   48.9 ml 27.43 ml/m  RA Volume:   27.20 ml  15.26 ml/m LA Vol (A4C):   65.8 ml 36.91 ml/m LA Biplane Vol: 60.1 ml 33.71 ml/m  AORTIC VALVE AV Area (Vmax):    1.48 cm AV Area (Vmean):   1.59 cm AV Area (VTI):     1.43 cm AV Vmax:           213.33 cm/s AV Vmean:          133.333 cm/s AV VTI:            0.489 m AV Peak Grad:      18.2 mmHg AV Mean Grad:      9.0 mmHg LVOT Vmax:         111.00 cm/s LVOT Vmean:        74.900 cm/s LVOT VTI:          0.247 m LVOT/AV VTI ratio: 0.51 AI PHT:            499 msec  AORTA Ao Root diam: 4.30 cm MITRAL VALVE                TRICUSPID VALVE MV Area (PHT): 2.54 cm     TR Peak grad:   21.9 mmHg MV Area VTI:   2.00 cm     TR Vmax:        234.00 cm/s MV Peak grad:  7.8 mmHg MV Mean grad:  2.0 mmHg     SHUNTS MV Vmax:       1.40 m/s     Systemic VTI:  0.25 m MV Vmean:      56.5 cm/s    Systemic Diam: 1.90 cm MV Decel Time: 299 msec MV E velocity: 62.40 cm/s MV A velocity: 148.00 cm/s MV E/A ratio:  0.42 Armanda Magic MD Electronically signed by Armanda Magic MD Signature Date/Time: 05/11/2022/6:16:34 PM    Final    DG Chest Port 1 View  Result Date: 05/10/2022 CLINICAL DATA:  Acute chest pain. EXAM: PORTABLE CHEST 1 VIEW COMPARISON:  02/24/2022 and prior studies FINDINGS: The cardiomediastinal silhouette is unremarkable. There is no evidence of focal airspace disease, pulmonary edema, suspicious pulmonary nodule/mass, pleural effusion, or pneumothorax. No acute bony abnormalities are identified. IMPRESSION: No active disease. Electronically Signed   By: Harmon Pier M.D.   On: 05/10/2022 13:36    Today   Subjective    Carollee Massed today  has no new  complaints Tolerating modified diet okay -No behavioral concerns at this time   Patient has been seen and examined prior to discharge   Objective   Blood pressure (!) 110/53, pulse (!) 59, temperature 98 F (36.7 C), temperature source Oral, resp. rate 19, height 5\' 8"  (1.727 m), weight 65.8 kg, SpO2 99 %.   Intake/Output Summary (Last 24 hours) at 05/12/2022 1210 Last data filed at 05/11/2022 1546 Gross per 24 hour  Intake 49.19 ml  Output --  Net 49.19 ml     Exam-  -General -awake Alert, in no acute distress HEENT:- Vincent.AT, No sclera icterus Neck-Supple Neck,No JVD,.  Lungs-  CTAB , fair symmetrical air movement CV- S1, S2 normal, regular  Abd-  +ve B.Sounds, Abd Soft, No tenderness,    Extremity/Skin:- No  edema, pedal pulses present  Psych-affect is flat, oriented x 1 Neuro-facial asymmetry, motor aphasia, right-sided hemiparesis no additional new focal deficits, no tremors    Data Review   CBC w Diff:  Lab Results  Component Value Date   WBC 5.8 05/10/2022   HGB 13.3 05/10/2022   HGB 12.1 (L) 08/20/2021   HCT 41.4 05/10/2022   HCT 36.5 (L) 08/20/2021   PLT 274 05/10/2022   PLT 202 08/20/2021   LYMPHOPCT 15 02/27/2022   MONOPCT 9 02/27/2022   EOSPCT 0 02/27/2022   BASOPCT 0 02/27/2022    CMP:  Lab Results  Component Value Date   NA 136 05/12/2022   NA 137 08/20/2021   K 4.2 05/12/2022   CL 105 05/12/2022   CO2 25 05/12/2022   BUN 20 05/12/2022   BUN 13 08/20/2021   CREATININE 0.73 05/12/2022   PROT 5.5 (L) 02/18/2022   PROT 6.2 08/20/2021   ALBUMIN 3.3 (L) 05/12/2022   ALBUMIN 4.3 08/20/2021   BILITOT 0.6 02/18/2022   BILITOT 0.4 08/20/2021   ALKPHOS 56 02/18/2022   AST 46 (H) 02/18/2022   ALT 30 02/18/2022  .  Total Discharge time is about 33 minutes  Shon Haleourage Teiara Baria M.D on 05/12/2022 at 12:10 PM  Go to [email or web address] -  for contact info  [facility] - Office  [phone]

## 2022-05-12 NOTE — TOC Transition Note (Signed)
Transition of Care [facility]) - CM/SW Discharge Note   Patient Details  Name: Paul Bradshaw MRN: [id number] Date of Birth: 08-09-1925  Transition of Care [facility] CM/SW Contact:  Karn Cassis, LCSW Phone Number: 05/12/2022, 11:58 AM   Clinical Narrative:  Pt d/c today. Discussed resuming home health with son who is agreeable. Pt was active with PT, OT, ST, and aid with [facility]. Clifton Custard with [facility] notified of d/c. No orders needed as pt in observation. No other needs reported.       Final next level of care: Home w Home Health Services Barriers to Discharge: Barriers Resolved   Patient Goals and CMS Choice Patient states their goals for this hospitalization and ongoing recovery are:: return home   Choice offered to / list presented to : Adult Children  Discharge Placement                  Name of family member notified: son Patient and family notified of of transfer: 05/12/22  Discharge Plan and Services                          HH Arranged: PT, OT, Speech Therapy, Nurse's Aide HH Agency: [facility] Date [facility] Agency Contacted: 05/12/22 Time HH Agency Contacted: 1158 Representative spoke with at [facility] Agency: Clifton Custard  Social Determinants of Health (SDOH) Interventions     Readmission Risk Interventions     No data to display

## 2022-05-12 NOTE — Progress Notes (Signed)
Patient rested through this shift. Patient does not appear to be in any distress at this time.No prn medications given.

## 2022-05-13 DIAGNOSIS — U071 COVID-19: Secondary | ICD-10-CM | POA: Diagnosis not present

## 2022-05-13 DIAGNOSIS — Z9049 Acquired absence of other specified parts of digestive tract: Secondary | ICD-10-CM | POA: Diagnosis not present

## 2022-05-13 DIAGNOSIS — I700 Atherosclerosis of aorta: Secondary | ICD-10-CM | POA: Diagnosis not present

## 2022-05-13 DIAGNOSIS — Z7902 Long term (current) use of antithrombotics/antiplatelets: Secondary | ICD-10-CM | POA: Diagnosis not present

## 2022-05-13 DIAGNOSIS — R7303 Prediabetes: Secondary | ICD-10-CM | POA: Diagnosis not present

## 2022-05-13 DIAGNOSIS — Z993 Dependence on wheelchair: Secondary | ICD-10-CM | POA: Diagnosis not present

## 2022-05-13 DIAGNOSIS — Z981 Arthrodesis status: Secondary | ICD-10-CM | POA: Diagnosis not present

## 2022-05-13 DIAGNOSIS — M5412 Radiculopathy, cervical region: Secondary | ICD-10-CM | POA: Diagnosis not present

## 2022-05-13 DIAGNOSIS — G8929 Other chronic pain: Secondary | ICD-10-CM | POA: Diagnosis not present

## 2022-05-13 DIAGNOSIS — Z9181 History of falling: Secondary | ICD-10-CM | POA: Diagnosis not present

## 2022-05-13 DIAGNOSIS — M5030 Other cervical disc degeneration, unspecified cervical region: Secondary | ICD-10-CM | POA: Diagnosis not present

## 2022-05-13 DIAGNOSIS — I69320 Aphasia following cerebral infarction: Secondary | ICD-10-CM | POA: Diagnosis not present

## 2022-05-13 DIAGNOSIS — I69392 Facial weakness following cerebral infarction: Secondary | ICD-10-CM | POA: Diagnosis not present

## 2022-05-13 DIAGNOSIS — E43 Unspecified severe protein-calorie malnutrition: Secondary | ICD-10-CM | POA: Diagnosis not present

## 2022-05-13 DIAGNOSIS — M1990 Unspecified osteoarthritis, unspecified site: Secondary | ICD-10-CM | POA: Diagnosis not present

## 2022-05-13 DIAGNOSIS — I10 Essential (primary) hypertension: Secondary | ICD-10-CM | POA: Diagnosis not present

## 2022-05-13 DIAGNOSIS — E785 Hyperlipidemia, unspecified: Secondary | ICD-10-CM | POA: Diagnosis not present

## 2022-05-13 DIAGNOSIS — R1310 Dysphagia, unspecified: Secondary | ICD-10-CM | POA: Diagnosis not present

## 2022-05-13 DIAGNOSIS — I351 Nonrheumatic aortic (valve) insufficiency: Secondary | ICD-10-CM | POA: Diagnosis not present

## 2022-05-13 DIAGNOSIS — Z7982 Long term (current) use of aspirin: Secondary | ICD-10-CM | POA: Diagnosis not present

## 2022-05-13 DIAGNOSIS — I69391 Dysphagia following cerebral infarction: Secondary | ICD-10-CM | POA: Diagnosis not present

## 2022-05-13 DIAGNOSIS — H409 Unspecified glaucoma: Secondary | ICD-10-CM | POA: Diagnosis not present

## 2022-05-13 DIAGNOSIS — I69351 Hemiplegia and hemiparesis following cerebral infarction affecting right dominant side: Secondary | ICD-10-CM | POA: Diagnosis not present

## 2022-05-14 ENCOUNTER — Telehealth: Payer: Self-pay | Admitting: Family Medicine

## 2022-05-14 DIAGNOSIS — I351 Nonrheumatic aortic (valve) insufficiency: Secondary | ICD-10-CM | POA: Diagnosis not present

## 2022-05-14 DIAGNOSIS — Z981 Arthrodesis status: Secondary | ICD-10-CM | POA: Diagnosis not present

## 2022-05-14 DIAGNOSIS — I69351 Hemiplegia and hemiparesis following cerebral infarction affecting right dominant side: Secondary | ICD-10-CM | POA: Diagnosis not present

## 2022-05-14 DIAGNOSIS — H409 Unspecified glaucoma: Secondary | ICD-10-CM | POA: Diagnosis not present

## 2022-05-14 DIAGNOSIS — Z7982 Long term (current) use of aspirin: Secondary | ICD-10-CM | POA: Diagnosis not present

## 2022-05-14 DIAGNOSIS — I10 Essential (primary) hypertension: Secondary | ICD-10-CM | POA: Diagnosis not present

## 2022-05-14 DIAGNOSIS — I700 Atherosclerosis of aorta: Secondary | ICD-10-CM | POA: Diagnosis not present

## 2022-05-14 DIAGNOSIS — Z993 Dependence on wheelchair: Secondary | ICD-10-CM | POA: Diagnosis not present

## 2022-05-14 DIAGNOSIS — E43 Unspecified severe protein-calorie malnutrition: Secondary | ICD-10-CM | POA: Diagnosis not present

## 2022-05-14 DIAGNOSIS — I69391 Dysphagia following cerebral infarction: Secondary | ICD-10-CM | POA: Diagnosis not present

## 2022-05-14 DIAGNOSIS — I69320 Aphasia following cerebral infarction: Secondary | ICD-10-CM | POA: Diagnosis not present

## 2022-05-14 DIAGNOSIS — M1990 Unspecified osteoarthritis, unspecified site: Secondary | ICD-10-CM | POA: Diagnosis not present

## 2022-05-14 DIAGNOSIS — G8929 Other chronic pain: Secondary | ICD-10-CM | POA: Diagnosis not present

## 2022-05-14 DIAGNOSIS — E785 Hyperlipidemia, unspecified: Secondary | ICD-10-CM | POA: Diagnosis not present

## 2022-05-14 DIAGNOSIS — R1310 Dysphagia, unspecified: Secondary | ICD-10-CM | POA: Diagnosis not present

## 2022-05-14 DIAGNOSIS — I69392 Facial weakness following cerebral infarction: Secondary | ICD-10-CM | POA: Diagnosis not present

## 2022-05-14 DIAGNOSIS — M5030 Other cervical disc degeneration, unspecified cervical region: Secondary | ICD-10-CM | POA: Diagnosis not present

## 2022-05-14 DIAGNOSIS — U071 COVID-19: Secondary | ICD-10-CM | POA: Diagnosis not present

## 2022-05-14 DIAGNOSIS — Z7902 Long term (current) use of antithrombotics/antiplatelets: Secondary | ICD-10-CM | POA: Diagnosis not present

## 2022-05-14 DIAGNOSIS — Z9181 History of falling: Secondary | ICD-10-CM | POA: Diagnosis not present

## 2022-05-14 DIAGNOSIS — M5412 Radiculopathy, cervical region: Secondary | ICD-10-CM | POA: Diagnosis not present

## 2022-05-14 DIAGNOSIS — R7303 Prediabetes: Secondary | ICD-10-CM | POA: Diagnosis not present

## 2022-05-14 DIAGNOSIS — Z9049 Acquired absence of other specified parts of digestive tract: Secondary | ICD-10-CM | POA: Diagnosis not present

## 2022-05-19 ENCOUNTER — Other Ambulatory Visit: Payer: Self-pay | Admitting: Nurse Practitioner

## 2022-05-19 NOTE — Telephone Encounter (Signed)
TC w/ [place] OT w/ [facility] HH VO given to extend OT Also gave her VO to extend PT, she will give this to [place]

## 2022-05-21 DIAGNOSIS — M1990 Unspecified osteoarthritis, unspecified site: Secondary | ICD-10-CM | POA: Diagnosis not present

## 2022-05-21 DIAGNOSIS — I700 Atherosclerosis of aorta: Secondary | ICD-10-CM | POA: Diagnosis not present

## 2022-05-21 DIAGNOSIS — Z9181 History of falling: Secondary | ICD-10-CM | POA: Diagnosis not present

## 2022-05-21 DIAGNOSIS — E785 Hyperlipidemia, unspecified: Secondary | ICD-10-CM | POA: Diagnosis not present

## 2022-05-21 DIAGNOSIS — G8929 Other chronic pain: Secondary | ICD-10-CM | POA: Diagnosis not present

## 2022-05-21 DIAGNOSIS — M5412 Radiculopathy, cervical region: Secondary | ICD-10-CM | POA: Diagnosis not present

## 2022-05-21 DIAGNOSIS — U071 COVID-19: Secondary | ICD-10-CM | POA: Diagnosis not present

## 2022-05-21 DIAGNOSIS — R1310 Dysphagia, unspecified: Secondary | ICD-10-CM | POA: Diagnosis not present

## 2022-05-21 DIAGNOSIS — Z7982 Long term (current) use of aspirin: Secondary | ICD-10-CM | POA: Diagnosis not present

## 2022-05-21 DIAGNOSIS — Z7902 Long term (current) use of antithrombotics/antiplatelets: Secondary | ICD-10-CM | POA: Diagnosis not present

## 2022-05-21 DIAGNOSIS — Z993 Dependence on wheelchair: Secondary | ICD-10-CM | POA: Diagnosis not present

## 2022-05-21 DIAGNOSIS — I351 Nonrheumatic aortic (valve) insufficiency: Secondary | ICD-10-CM | POA: Diagnosis not present

## 2022-05-21 DIAGNOSIS — I69391 Dysphagia following cerebral infarction: Secondary | ICD-10-CM | POA: Diagnosis not present

## 2022-05-21 DIAGNOSIS — M5030 Other cervical disc degeneration, unspecified cervical region: Secondary | ICD-10-CM | POA: Diagnosis not present

## 2022-05-21 DIAGNOSIS — Z981 Arthrodesis status: Secondary | ICD-10-CM | POA: Diagnosis not present

## 2022-05-21 DIAGNOSIS — H409 Unspecified glaucoma: Secondary | ICD-10-CM | POA: Diagnosis not present

## 2022-05-21 DIAGNOSIS — E43 Unspecified severe protein-calorie malnutrition: Secondary | ICD-10-CM | POA: Diagnosis not present

## 2022-05-21 DIAGNOSIS — I10 Essential (primary) hypertension: Secondary | ICD-10-CM | POA: Diagnosis not present

## 2022-05-21 DIAGNOSIS — I69351 Hemiplegia and hemiparesis following cerebral infarction affecting right dominant side: Secondary | ICD-10-CM | POA: Diagnosis not present

## 2022-05-21 DIAGNOSIS — R7303 Prediabetes: Secondary | ICD-10-CM | POA: Diagnosis not present

## 2022-05-21 DIAGNOSIS — I69320 Aphasia following cerebral infarction: Secondary | ICD-10-CM | POA: Diagnosis not present

## 2022-05-21 DIAGNOSIS — Z9049 Acquired absence of other specified parts of digestive tract: Secondary | ICD-10-CM | POA: Diagnosis not present

## 2022-05-21 DIAGNOSIS — I69392 Facial weakness following cerebral infarction: Secondary | ICD-10-CM | POA: Diagnosis not present

## 2022-05-23 ENCOUNTER — Ambulatory Visit (INDEPENDENT_AMBULATORY_CARE_PROVIDER_SITE_OTHER): Payer: Medicare Other | Admitting: Family Medicine

## 2022-05-23 ENCOUNTER — Encounter: Payer: Self-pay | Admitting: Family Medicine

## 2022-05-23 DIAGNOSIS — E43 Unspecified severe protein-calorie malnutrition: Secondary | ICD-10-CM | POA: Diagnosis not present

## 2022-05-23 DIAGNOSIS — M5412 Radiculopathy, cervical region: Secondary | ICD-10-CM | POA: Diagnosis not present

## 2022-05-23 DIAGNOSIS — M5030 Other cervical disc degeneration, unspecified cervical region: Secondary | ICD-10-CM | POA: Diagnosis not present

## 2022-05-23 DIAGNOSIS — M10032 Idiopathic gout, left wrist: Secondary | ICD-10-CM | POA: Diagnosis not present

## 2022-05-23 DIAGNOSIS — Z7902 Long term (current) use of antithrombotics/antiplatelets: Secondary | ICD-10-CM | POA: Diagnosis not present

## 2022-05-23 DIAGNOSIS — I69391 Dysphagia following cerebral infarction: Secondary | ICD-10-CM | POA: Diagnosis not present

## 2022-05-23 DIAGNOSIS — M1990 Unspecified osteoarthritis, unspecified site: Secondary | ICD-10-CM | POA: Diagnosis not present

## 2022-05-23 DIAGNOSIS — R7303 Prediabetes: Secondary | ICD-10-CM | POA: Diagnosis not present

## 2022-05-23 DIAGNOSIS — I69320 Aphasia following cerebral infarction: Secondary | ICD-10-CM | POA: Diagnosis not present

## 2022-05-23 DIAGNOSIS — H409 Unspecified glaucoma: Secondary | ICD-10-CM | POA: Diagnosis not present

## 2022-05-23 DIAGNOSIS — Z9181 History of falling: Secondary | ICD-10-CM | POA: Diagnosis not present

## 2022-05-23 DIAGNOSIS — I10 Essential (primary) hypertension: Secondary | ICD-10-CM | POA: Diagnosis not present

## 2022-05-23 DIAGNOSIS — I700 Atherosclerosis of aorta: Secondary | ICD-10-CM | POA: Diagnosis not present

## 2022-05-23 DIAGNOSIS — R1310 Dysphagia, unspecified: Secondary | ICD-10-CM | POA: Diagnosis not present

## 2022-05-23 DIAGNOSIS — I351 Nonrheumatic aortic (valve) insufficiency: Secondary | ICD-10-CM | POA: Diagnosis not present

## 2022-05-23 DIAGNOSIS — G8929 Other chronic pain: Secondary | ICD-10-CM | POA: Diagnosis not present

## 2022-05-23 DIAGNOSIS — U071 COVID-19: Secondary | ICD-10-CM | POA: Diagnosis not present

## 2022-05-23 DIAGNOSIS — Z9049 Acquired absence of other specified parts of digestive tract: Secondary | ICD-10-CM | POA: Diagnosis not present

## 2022-05-23 DIAGNOSIS — E785 Hyperlipidemia, unspecified: Secondary | ICD-10-CM | POA: Diagnosis not present

## 2022-05-23 DIAGNOSIS — I69351 Hemiplegia and hemiparesis following cerebral infarction affecting right dominant side: Secondary | ICD-10-CM | POA: Diagnosis not present

## 2022-05-23 DIAGNOSIS — I69392 Facial weakness following cerebral infarction: Secondary | ICD-10-CM | POA: Diagnosis not present

## 2022-05-23 DIAGNOSIS — Z7982 Long term (current) use of aspirin: Secondary | ICD-10-CM | POA: Diagnosis not present

## 2022-05-23 DIAGNOSIS — Z981 Arthrodesis status: Secondary | ICD-10-CM | POA: Diagnosis not present

## 2022-05-23 DIAGNOSIS — Z993 Dependence on wheelchair: Secondary | ICD-10-CM | POA: Diagnosis not present

## 2022-05-23 MED ORDER — PREDNISONE 20 MG PO TABS: 40.0000 mg | ORAL_TABLET | Freq: Every day | ORAL | 0 refills | Status: AC

## 2022-05-23 NOTE — Progress Notes (Signed)
Virtual Visit via telephone Note Due to COVID-19 pandemic this visit was conducted virtually. This visit type was conducted due to national recommendations for restrictions regarding the COVID-19 Pandemic (e.g. social distancing, sheltering in place) in an effort to limit this patient's exposure and mitigate transmission in our community. All issues noted in this document were discussed and addressed.  A physical exam was not performed with this format.   I connected with Paul Bradshaw's son on 05/23/2022 at 1100 by telephone and verified that I am speaking with the correct person using two identifiers. Paul Bradshaw is currently located at home and family is currently with them during visit. The provider, Kari Baars, FNP is located in their office at time of visit.  I discussed the limitations, risks, security and privacy concerns of performing an evaluation and management service by virtual visit and the availability of in person appointments. I also discussed with the patient that there may be a patient responsible charge related to this service. The patient expressed understanding and agreed to proceed.  Subjective:  Patient ID: Paul Bradshaw, male    DOB: March 21, 1926, 88 y.o.   MRN: [id number]  Chief Complaint:  Joint Swelling   HPI: Paul Bradshaw is a 88 y.o. male presenting on 05/23/2022 for Joint Swelling   Pts son reports swelling, erythema, and increased heat to left wrist. Ongoing for the last 4 days. No systemic fever. Known history of gout. No reported injuries. No wounds per son. States physical therapy was at the house today and pt was able to stand and take a few steps for them. He is slowly improving per sons report.      Relevant past medical, surgical, family, and social history reviewed and updated as indicated.  Allergies and medications reviewed and updated.   Past Medical History:  Diagnosis Date   Aortic regurgitation    Moderate   Arthritis    BPH (benign  prostatic hyperplasia)    Cervical disc disease    Cervical radiculopathy    Hyperlipidemia    Hypertension    Prediabetes 02/16/2021   Staphylococcus aureus bacteremia 08/09/2012   TEE negative for vegetation or thrombus February 2014   Stroke [facility]) 2005 or 2006   3   Symptomatic carotid artery stenosis with infarction [facility]) 2005 or 2006   Status post right carotid endarterectomy    Past Surgical History:  Procedure Laterality Date   BACK SURGERY     CAROTID ENDARTERECTOMY     CATARACT EXTRACTION W/PHACO Left 02/15/2015   Procedure: CATARACT EXTRACTION PHACO AND INTRAOCULAR LENS PLACEMENT LEFT EYE CDE=30.93;  Surgeon: Gemma Payor, MD;  Location: [facility];  Service: Ophthalmology;  Laterality: Left;   CHOLECYSTECTOMY     EYE SURGERY     KIDNEY STONE SURGERY     LUMBAR LAMINECTOMY/DECOMPRESSION MICRODISCECTOMY Bilateral 01/23/2014   Procedure: LUMBAR LAMINECTOMY/DECOMPRESSION MICRODISCECTOMY 1 LEVEL L5-S1;  Surgeon: Temple Pacini, MD;  Location: [facility] NEURO [facility];  Service: Neurosurgery;  Laterality: Bilateral;  LUMBAR LAMINECTOMY/DECOMPRESSION MICRODISCECTOMY 1 LEVEL L5-S1   TEE WITHOUT CARDIOVERSION N/A 08/12/2012   Procedure: TRANSESOPHAGEAL ECHOCARDIOGRAM (TEE);  Surgeon: Wendall Stade, MD;  Location: [facility] END[facility] SU[facility];  Service: Cardiovascular;  Laterality: N/A;   TEE WITHOUT CARDIOVERSION N/A 06/24/2013   Procedure: TRANSESOPHAGEAL ECHOCARDIOGRAM (TEE);  Surgeon: Jonelle Sidle, MD;  Location: [facility] END[facility];  Service: Endoscopy;  Laterality: N/A;    Social History   Socioeconomic History   Marital status: Married    Spouse name:  Not on file   Number of children: Not on file   Years of education: Not on file   Highest education level: Not on file  Occupational History   Not on file  Tobacco Use   Smoking status: Never   Smokeless tobacco: Never  Vaping Use   Vaping Use: Never used  Substance and Sexual Activity   Alcohol use: No   Drug use: No   Sexual activity: Not  Currently  Other Topics Concern   Not on file  Social History Narrative   Not on file   Social Determinants of Health   Financial Resource Strain: Not on file  Food Insecurity: No Food Insecurity (05/10/2022)   Hunger Vital Sign    Worried About Running Out of Food in the Last Year: Never true    Ran Out of Food in the Last Year: Never true  Transportation Needs: No Transportation Needs (05/10/2022)   PRAPARE - Administrator, Civil Service (Medical): No    Lack of Transportation (Non-Medical): No  Physical Activity: Not on file  Stress: Not on file  Social Connections: Not on file  Intimate Partner Violence: Not At Risk (05/10/2022)   Humiliation, Afraid, Rape, and Kick questionnaire    Fear of Current or Ex-Partner: No    Emotionally Abused: No    Physically Abused: No    Sexually Abused: No    Outpatient Encounter Medications as of 05/23/2022  Medication Sig   predniSONE (DELTASONE) 20 MG tablet Take 2 tablets (40 mg total) by mouth daily with breakfast for 5 days. 2 po daily for 5 days   acetaminophen (TYLENOL) 325 MG tablet Take 1-2 tablets (325-650 mg total) by mouth every 4 (four) hours as needed for mild pain.   albuterol (VENTOLIN HFA) 108 (90 Base) MCG/ACT inhaler Inhale 2 puffs into the lungs every 6 (six) hours as needed for shortness of breath or wheezing.   aspirin EC 81 MG tablet Take 1 tablet (81 mg total) by mouth daily with breakfast.   atorvastatin (LIPITOR) 20 MG tablet Take 1 tablet (20 mg total) by mouth daily.   brimonidine (ALPHAGAN) 0.2 % ophthalmic solution Place 1 drop into both eyes 3 (three) times daily.   clopidogrel (PLAVIX) 75 MG tablet Take 1 tablet (75 mg total) by mouth daily with breakfast.   famotidine (PEPCID) 20 MG tablet TAKE ONE TABLET TWICE DAILY   latanoprost (XALATAN) 0.005 % ophthalmic solution Place 1 drop into both eyes every evening.   metoprolol tartrate (LOPRESSOR) 25 MG tablet Take 1 tablet (25 mg total) by mouth 2  (two) times daily.   No facility-administered encounter medications on file as of 05/23/2022.    Allergies  Allergen Reactions   Amlodipine Swelling    Swelling of lips.   Lisinopril Other (See Comments)    Elevated BP higher & causes a burning feeling on the inside.     Review of Systems  Unable to perform ROS: Mental status change (ROS per son)  Musculoskeletal:  Positive for arthralgias and joint swelling.         Observations/Objective: No vital signs or physical exam, this was a virtual health encounter.  Pt alert and oriented, answers all questions appropriately, and able to speak in full sentences.    Assessment and Plan: Wisam was seen today for joint swelling.  Diagnoses and all orders for this visit:  Acute idiopathic gout of left wrist Reported symptoms consistent with gout. Will burst with steroids to see  if beneficial. Son aware to report, new, worsening, or persistent symptoms. If symptoms persist, pt will need to be seen in office.  -     predniSONE (DELTASONE) 20 MG tablet; Take 2 tablets (40 mg total) by mouth daily with breakfast for 5 days. 2 po daily for 5 days    Follow Up Instructions: Return if symptoms worsen or fail to improve.    I discussed the assessment and treatment plan with the patient. The patient was provided an opportunity to ask questions and all were answered. The patient agreed with the plan and demonstrated an understanding of the instructions.   The patient was advised to call back or seek an in-person evaluation if the symptoms worsen or if the condition fails to improve as anticipated.  The above assessment and management plan was discussed with the patient. The patient verbalized understanding of and has agreed to the management plan. Patient is aware to call the clinic if they develop any new symptoms or if symptoms persist or worsen. Patient is aware when to return to the clinic for a follow-up visit. Patient educated on when it  is appropriate to go to the emergency department.    I provided 12 minutes of time during this telephone encounter.   Kari Baars, FNP-C [facility] Medicine [address] [place], [place] [address] [phone] 05/23/2022

## 2022-05-26 DIAGNOSIS — E785 Hyperlipidemia, unspecified: Secondary | ICD-10-CM | POA: Diagnosis not present

## 2022-05-26 DIAGNOSIS — Z981 Arthrodesis status: Secondary | ICD-10-CM | POA: Diagnosis not present

## 2022-05-26 DIAGNOSIS — Z7982 Long term (current) use of aspirin: Secondary | ICD-10-CM | POA: Diagnosis not present

## 2022-05-26 DIAGNOSIS — G8929 Other chronic pain: Secondary | ICD-10-CM | POA: Diagnosis not present

## 2022-05-26 DIAGNOSIS — H409 Unspecified glaucoma: Secondary | ICD-10-CM | POA: Diagnosis not present

## 2022-05-26 DIAGNOSIS — I700 Atherosclerosis of aorta: Secondary | ICD-10-CM | POA: Diagnosis not present

## 2022-05-26 DIAGNOSIS — M5030 Other cervical disc degeneration, unspecified cervical region: Secondary | ICD-10-CM | POA: Diagnosis not present

## 2022-05-26 DIAGNOSIS — I351 Nonrheumatic aortic (valve) insufficiency: Secondary | ICD-10-CM | POA: Diagnosis not present

## 2022-05-26 DIAGNOSIS — E43 Unspecified severe protein-calorie malnutrition: Secondary | ICD-10-CM | POA: Diagnosis not present

## 2022-05-26 DIAGNOSIS — Z7902 Long term (current) use of antithrombotics/antiplatelets: Secondary | ICD-10-CM | POA: Diagnosis not present

## 2022-05-26 DIAGNOSIS — Z993 Dependence on wheelchair: Secondary | ICD-10-CM | POA: Diagnosis not present

## 2022-05-26 DIAGNOSIS — I10 Essential (primary) hypertension: Secondary | ICD-10-CM | POA: Diagnosis not present

## 2022-05-26 DIAGNOSIS — I69320 Aphasia following cerebral infarction: Secondary | ICD-10-CM | POA: Diagnosis not present

## 2022-05-26 DIAGNOSIS — I69391 Dysphagia following cerebral infarction: Secondary | ICD-10-CM | POA: Diagnosis not present

## 2022-05-26 DIAGNOSIS — M1990 Unspecified osteoarthritis, unspecified site: Secondary | ICD-10-CM | POA: Diagnosis not present

## 2022-05-26 DIAGNOSIS — Z9049 Acquired absence of other specified parts of digestive tract: Secondary | ICD-10-CM | POA: Diagnosis not present

## 2022-05-26 DIAGNOSIS — U071 COVID-19: Secondary | ICD-10-CM | POA: Diagnosis not present

## 2022-05-26 DIAGNOSIS — R1310 Dysphagia, unspecified: Secondary | ICD-10-CM | POA: Diagnosis not present

## 2022-05-26 DIAGNOSIS — I69351 Hemiplegia and hemiparesis following cerebral infarction affecting right dominant side: Secondary | ICD-10-CM | POA: Diagnosis not present

## 2022-05-26 DIAGNOSIS — I69392 Facial weakness following cerebral infarction: Secondary | ICD-10-CM | POA: Diagnosis not present

## 2022-05-26 DIAGNOSIS — Z9181 History of falling: Secondary | ICD-10-CM | POA: Diagnosis not present

## 2022-05-26 DIAGNOSIS — R7303 Prediabetes: Secondary | ICD-10-CM | POA: Diagnosis not present

## 2022-05-26 DIAGNOSIS — M5412 Radiculopathy, cervical region: Secondary | ICD-10-CM | POA: Diagnosis not present

## 2022-05-27 ENCOUNTER — Inpatient Hospital Stay: Payer: Medicare Other | Admitting: Neurology

## 2022-05-27 ENCOUNTER — Encounter: Payer: Self-pay | Admitting: Neurology

## 2022-05-28 DIAGNOSIS — I700 Atherosclerosis of aorta: Secondary | ICD-10-CM | POA: Diagnosis not present

## 2022-05-28 DIAGNOSIS — I69391 Dysphagia following cerebral infarction: Secondary | ICD-10-CM | POA: Diagnosis not present

## 2022-05-28 DIAGNOSIS — R7303 Prediabetes: Secondary | ICD-10-CM | POA: Diagnosis not present

## 2022-05-28 DIAGNOSIS — Z9049 Acquired absence of other specified parts of digestive tract: Secondary | ICD-10-CM | POA: Diagnosis not present

## 2022-05-28 DIAGNOSIS — E43 Unspecified severe protein-calorie malnutrition: Secondary | ICD-10-CM | POA: Diagnosis not present

## 2022-05-28 DIAGNOSIS — R1310 Dysphagia, unspecified: Secondary | ICD-10-CM | POA: Diagnosis not present

## 2022-05-28 DIAGNOSIS — I351 Nonrheumatic aortic (valve) insufficiency: Secondary | ICD-10-CM | POA: Diagnosis not present

## 2022-05-28 DIAGNOSIS — Z7902 Long term (current) use of antithrombotics/antiplatelets: Secondary | ICD-10-CM | POA: Diagnosis not present

## 2022-05-28 DIAGNOSIS — I10 Essential (primary) hypertension: Secondary | ICD-10-CM | POA: Diagnosis not present

## 2022-05-28 DIAGNOSIS — Z993 Dependence on wheelchair: Secondary | ICD-10-CM | POA: Diagnosis not present

## 2022-05-28 DIAGNOSIS — I69351 Hemiplegia and hemiparesis following cerebral infarction affecting right dominant side: Secondary | ICD-10-CM | POA: Diagnosis not present

## 2022-05-28 DIAGNOSIS — I69320 Aphasia following cerebral infarction: Secondary | ICD-10-CM | POA: Diagnosis not present

## 2022-05-28 DIAGNOSIS — E785 Hyperlipidemia, unspecified: Secondary | ICD-10-CM | POA: Diagnosis not present

## 2022-05-28 DIAGNOSIS — M5030 Other cervical disc degeneration, unspecified cervical region: Secondary | ICD-10-CM | POA: Diagnosis not present

## 2022-05-28 DIAGNOSIS — Z9181 History of falling: Secondary | ICD-10-CM | POA: Diagnosis not present

## 2022-05-28 DIAGNOSIS — Z981 Arthrodesis status: Secondary | ICD-10-CM | POA: Diagnosis not present

## 2022-05-28 DIAGNOSIS — M5412 Radiculopathy, cervical region: Secondary | ICD-10-CM | POA: Diagnosis not present

## 2022-05-28 DIAGNOSIS — G8929 Other chronic pain: Secondary | ICD-10-CM | POA: Diagnosis not present

## 2022-05-28 DIAGNOSIS — H409 Unspecified glaucoma: Secondary | ICD-10-CM | POA: Diagnosis not present

## 2022-05-28 DIAGNOSIS — U071 COVID-19: Secondary | ICD-10-CM | POA: Diagnosis not present

## 2022-05-28 DIAGNOSIS — M1990 Unspecified osteoarthritis, unspecified site: Secondary | ICD-10-CM | POA: Diagnosis not present

## 2022-05-28 DIAGNOSIS — Z7982 Long term (current) use of aspirin: Secondary | ICD-10-CM | POA: Diagnosis not present

## 2022-05-28 DIAGNOSIS — I69392 Facial weakness following cerebral infarction: Secondary | ICD-10-CM | POA: Diagnosis not present

## 2022-05-29 DIAGNOSIS — I351 Nonrheumatic aortic (valve) insufficiency: Secondary | ICD-10-CM | POA: Diagnosis not present

## 2022-05-29 DIAGNOSIS — E785 Hyperlipidemia, unspecified: Secondary | ICD-10-CM | POA: Diagnosis not present

## 2022-05-29 DIAGNOSIS — U071 COVID-19: Secondary | ICD-10-CM | POA: Diagnosis not present

## 2022-05-29 DIAGNOSIS — M5030 Other cervical disc degeneration, unspecified cervical region: Secondary | ICD-10-CM | POA: Diagnosis not present

## 2022-05-29 DIAGNOSIS — E43 Unspecified severe protein-calorie malnutrition: Secondary | ICD-10-CM | POA: Diagnosis not present

## 2022-05-29 DIAGNOSIS — Z7982 Long term (current) use of aspirin: Secondary | ICD-10-CM | POA: Diagnosis not present

## 2022-05-29 DIAGNOSIS — M5412 Radiculopathy, cervical region: Secondary | ICD-10-CM | POA: Diagnosis not present

## 2022-05-29 DIAGNOSIS — I700 Atherosclerosis of aorta: Secondary | ICD-10-CM | POA: Diagnosis not present

## 2022-05-29 DIAGNOSIS — Z7902 Long term (current) use of antithrombotics/antiplatelets: Secondary | ICD-10-CM | POA: Diagnosis not present

## 2022-05-29 DIAGNOSIS — I69391 Dysphagia following cerebral infarction: Secondary | ICD-10-CM | POA: Diagnosis not present

## 2022-05-29 DIAGNOSIS — Z993 Dependence on wheelchair: Secondary | ICD-10-CM | POA: Diagnosis not present

## 2022-05-29 DIAGNOSIS — R7303 Prediabetes: Secondary | ICD-10-CM | POA: Diagnosis not present

## 2022-05-29 DIAGNOSIS — G8929 Other chronic pain: Secondary | ICD-10-CM | POA: Diagnosis not present

## 2022-05-29 DIAGNOSIS — Z981 Arthrodesis status: Secondary | ICD-10-CM | POA: Diagnosis not present

## 2022-05-29 DIAGNOSIS — I69320 Aphasia following cerebral infarction: Secondary | ICD-10-CM | POA: Diagnosis not present

## 2022-05-29 DIAGNOSIS — Z9049 Acquired absence of other specified parts of digestive tract: Secondary | ICD-10-CM | POA: Diagnosis not present

## 2022-05-29 DIAGNOSIS — I10 Essential (primary) hypertension: Secondary | ICD-10-CM | POA: Diagnosis not present

## 2022-05-29 DIAGNOSIS — M1990 Unspecified osteoarthritis, unspecified site: Secondary | ICD-10-CM | POA: Diagnosis not present

## 2022-05-29 DIAGNOSIS — I69351 Hemiplegia and hemiparesis following cerebral infarction affecting right dominant side: Secondary | ICD-10-CM | POA: Diagnosis not present

## 2022-05-29 DIAGNOSIS — R1310 Dysphagia, unspecified: Secondary | ICD-10-CM | POA: Diagnosis not present

## 2022-05-29 DIAGNOSIS — Z9181 History of falling: Secondary | ICD-10-CM | POA: Diagnosis not present

## 2022-05-29 DIAGNOSIS — I69392 Facial weakness following cerebral infarction: Secondary | ICD-10-CM | POA: Diagnosis not present

## 2022-05-29 DIAGNOSIS — H409 Unspecified glaucoma: Secondary | ICD-10-CM | POA: Diagnosis not present

## 2022-05-30 DIAGNOSIS — R1310 Dysphagia, unspecified: Secondary | ICD-10-CM | POA: Diagnosis not present

## 2022-05-30 DIAGNOSIS — I69392 Facial weakness following cerebral infarction: Secondary | ICD-10-CM | POA: Diagnosis not present

## 2022-05-30 DIAGNOSIS — G8929 Other chronic pain: Secondary | ICD-10-CM | POA: Diagnosis not present

## 2022-05-30 DIAGNOSIS — Z981 Arthrodesis status: Secondary | ICD-10-CM | POA: Diagnosis not present

## 2022-05-30 DIAGNOSIS — I351 Nonrheumatic aortic (valve) insufficiency: Secondary | ICD-10-CM | POA: Diagnosis not present

## 2022-05-30 DIAGNOSIS — H409 Unspecified glaucoma: Secondary | ICD-10-CM | POA: Diagnosis not present

## 2022-05-30 DIAGNOSIS — Z7982 Long term (current) use of aspirin: Secondary | ICD-10-CM | POA: Diagnosis not present

## 2022-05-30 DIAGNOSIS — I69351 Hemiplegia and hemiparesis following cerebral infarction affecting right dominant side: Secondary | ICD-10-CM | POA: Diagnosis not present

## 2022-05-30 DIAGNOSIS — E43 Unspecified severe protein-calorie malnutrition: Secondary | ICD-10-CM | POA: Diagnosis not present

## 2022-05-30 DIAGNOSIS — M1990 Unspecified osteoarthritis, unspecified site: Secondary | ICD-10-CM | POA: Diagnosis not present

## 2022-05-30 DIAGNOSIS — Z9181 History of falling: Secondary | ICD-10-CM | POA: Diagnosis not present

## 2022-05-30 DIAGNOSIS — Z9049 Acquired absence of other specified parts of digestive tract: Secondary | ICD-10-CM | POA: Diagnosis not present

## 2022-05-30 DIAGNOSIS — I69320 Aphasia following cerebral infarction: Secondary | ICD-10-CM | POA: Diagnosis not present

## 2022-05-30 DIAGNOSIS — I10 Essential (primary) hypertension: Secondary | ICD-10-CM | POA: Diagnosis not present

## 2022-05-30 DIAGNOSIS — Z993 Dependence on wheelchair: Secondary | ICD-10-CM | POA: Diagnosis not present

## 2022-05-30 DIAGNOSIS — M5412 Radiculopathy, cervical region: Secondary | ICD-10-CM | POA: Diagnosis not present

## 2022-05-30 DIAGNOSIS — Z7902 Long term (current) use of antithrombotics/antiplatelets: Secondary | ICD-10-CM | POA: Diagnosis not present

## 2022-05-30 DIAGNOSIS — I69391 Dysphagia following cerebral infarction: Secondary | ICD-10-CM | POA: Diagnosis not present

## 2022-05-30 DIAGNOSIS — E785 Hyperlipidemia, unspecified: Secondary | ICD-10-CM | POA: Diagnosis not present

## 2022-05-30 DIAGNOSIS — I700 Atherosclerosis of aorta: Secondary | ICD-10-CM | POA: Diagnosis not present

## 2022-05-30 DIAGNOSIS — U071 COVID-19: Secondary | ICD-10-CM | POA: Diagnosis not present

## 2022-05-30 DIAGNOSIS — R7303 Prediabetes: Secondary | ICD-10-CM | POA: Diagnosis not present

## 2022-05-30 DIAGNOSIS — M5030 Other cervical disc degeneration, unspecified cervical region: Secondary | ICD-10-CM | POA: Diagnosis not present

## 2022-06-02 DIAGNOSIS — Z993 Dependence on wheelchair: Secondary | ICD-10-CM | POA: Diagnosis not present

## 2022-06-02 DIAGNOSIS — I69392 Facial weakness following cerebral infarction: Secondary | ICD-10-CM | POA: Diagnosis not present

## 2022-06-02 DIAGNOSIS — G8929 Other chronic pain: Secondary | ICD-10-CM | POA: Diagnosis not present

## 2022-06-02 DIAGNOSIS — R1310 Dysphagia, unspecified: Secondary | ICD-10-CM | POA: Diagnosis not present

## 2022-06-02 DIAGNOSIS — U071 COVID-19: Secondary | ICD-10-CM | POA: Diagnosis not present

## 2022-06-02 DIAGNOSIS — Z7902 Long term (current) use of antithrombotics/antiplatelets: Secondary | ICD-10-CM | POA: Diagnosis not present

## 2022-06-02 DIAGNOSIS — Z9181 History of falling: Secondary | ICD-10-CM | POA: Diagnosis not present

## 2022-06-02 DIAGNOSIS — Z7982 Long term (current) use of aspirin: Secondary | ICD-10-CM | POA: Diagnosis not present

## 2022-06-02 DIAGNOSIS — E785 Hyperlipidemia, unspecified: Secondary | ICD-10-CM | POA: Diagnosis not present

## 2022-06-02 DIAGNOSIS — H409 Unspecified glaucoma: Secondary | ICD-10-CM | POA: Diagnosis not present

## 2022-06-02 DIAGNOSIS — M1990 Unspecified osteoarthritis, unspecified site: Secondary | ICD-10-CM | POA: Diagnosis not present

## 2022-06-02 DIAGNOSIS — M5412 Radiculopathy, cervical region: Secondary | ICD-10-CM | POA: Diagnosis not present

## 2022-06-02 DIAGNOSIS — I69391 Dysphagia following cerebral infarction: Secondary | ICD-10-CM | POA: Diagnosis not present

## 2022-06-02 DIAGNOSIS — I69320 Aphasia following cerebral infarction: Secondary | ICD-10-CM | POA: Diagnosis not present

## 2022-06-02 DIAGNOSIS — I10 Essential (primary) hypertension: Secondary | ICD-10-CM | POA: Diagnosis not present

## 2022-06-02 DIAGNOSIS — E43 Unspecified severe protein-calorie malnutrition: Secondary | ICD-10-CM | POA: Diagnosis not present

## 2022-06-02 DIAGNOSIS — I351 Nonrheumatic aortic (valve) insufficiency: Secondary | ICD-10-CM | POA: Diagnosis not present

## 2022-06-02 DIAGNOSIS — Z981 Arthrodesis status: Secondary | ICD-10-CM | POA: Diagnosis not present

## 2022-06-02 DIAGNOSIS — M5030 Other cervical disc degeneration, unspecified cervical region: Secondary | ICD-10-CM | POA: Diagnosis not present

## 2022-06-02 DIAGNOSIS — R7303 Prediabetes: Secondary | ICD-10-CM | POA: Diagnosis not present

## 2022-06-02 DIAGNOSIS — Z9049 Acquired absence of other specified parts of digestive tract: Secondary | ICD-10-CM | POA: Diagnosis not present

## 2022-06-02 DIAGNOSIS — I700 Atherosclerosis of aorta: Secondary | ICD-10-CM | POA: Diagnosis not present

## 2022-06-02 DIAGNOSIS — I69351 Hemiplegia and hemiparesis following cerebral infarction affecting right dominant side: Secondary | ICD-10-CM | POA: Diagnosis not present

## 2022-06-04 DIAGNOSIS — H409 Unspecified glaucoma: Secondary | ICD-10-CM | POA: Diagnosis not present

## 2022-06-04 DIAGNOSIS — R7303 Prediabetes: Secondary | ICD-10-CM | POA: Diagnosis not present

## 2022-06-04 DIAGNOSIS — R1310 Dysphagia, unspecified: Secondary | ICD-10-CM | POA: Diagnosis not present

## 2022-06-04 DIAGNOSIS — I10 Essential (primary) hypertension: Secondary | ICD-10-CM | POA: Diagnosis not present

## 2022-06-04 DIAGNOSIS — M5412 Radiculopathy, cervical region: Secondary | ICD-10-CM | POA: Diagnosis not present

## 2022-06-04 DIAGNOSIS — Z981 Arthrodesis status: Secondary | ICD-10-CM | POA: Diagnosis not present

## 2022-06-04 DIAGNOSIS — E43 Unspecified severe protein-calorie malnutrition: Secondary | ICD-10-CM | POA: Diagnosis not present

## 2022-06-04 DIAGNOSIS — Z9049 Acquired absence of other specified parts of digestive tract: Secondary | ICD-10-CM | POA: Diagnosis not present

## 2022-06-04 DIAGNOSIS — I351 Nonrheumatic aortic (valve) insufficiency: Secondary | ICD-10-CM | POA: Diagnosis not present

## 2022-06-04 DIAGNOSIS — U071 COVID-19: Secondary | ICD-10-CM | POA: Diagnosis not present

## 2022-06-04 DIAGNOSIS — Z7982 Long term (current) use of aspirin: Secondary | ICD-10-CM | POA: Diagnosis not present

## 2022-06-04 DIAGNOSIS — G8929 Other chronic pain: Secondary | ICD-10-CM | POA: Diagnosis not present

## 2022-06-04 DIAGNOSIS — E785 Hyperlipidemia, unspecified: Secondary | ICD-10-CM | POA: Diagnosis not present

## 2022-06-04 DIAGNOSIS — I69391 Dysphagia following cerebral infarction: Secondary | ICD-10-CM | POA: Diagnosis not present

## 2022-06-04 DIAGNOSIS — I69392 Facial weakness following cerebral infarction: Secondary | ICD-10-CM | POA: Diagnosis not present

## 2022-06-04 DIAGNOSIS — Z9181 History of falling: Secondary | ICD-10-CM | POA: Diagnosis not present

## 2022-06-04 DIAGNOSIS — Z993 Dependence on wheelchair: Secondary | ICD-10-CM | POA: Diagnosis not present

## 2022-06-04 DIAGNOSIS — I69320 Aphasia following cerebral infarction: Secondary | ICD-10-CM | POA: Diagnosis not present

## 2022-06-04 DIAGNOSIS — M5030 Other cervical disc degeneration, unspecified cervical region: Secondary | ICD-10-CM | POA: Diagnosis not present

## 2022-06-04 DIAGNOSIS — I69351 Hemiplegia and hemiparesis following cerebral infarction affecting right dominant side: Secondary | ICD-10-CM | POA: Diagnosis not present

## 2022-06-04 DIAGNOSIS — I700 Atherosclerosis of aorta: Secondary | ICD-10-CM | POA: Diagnosis not present

## 2022-06-04 DIAGNOSIS — M1990 Unspecified osteoarthritis, unspecified site: Secondary | ICD-10-CM | POA: Diagnosis not present

## 2022-06-04 DIAGNOSIS — Z7902 Long term (current) use of antithrombotics/antiplatelets: Secondary | ICD-10-CM | POA: Diagnosis not present

## 2022-06-05 DIAGNOSIS — U071 COVID-19: Secondary | ICD-10-CM | POA: Diagnosis not present

## 2022-06-05 DIAGNOSIS — E43 Unspecified severe protein-calorie malnutrition: Secondary | ICD-10-CM | POA: Diagnosis not present

## 2022-06-05 DIAGNOSIS — R1310 Dysphagia, unspecified: Secondary | ICD-10-CM | POA: Diagnosis not present

## 2022-06-05 DIAGNOSIS — M5030 Other cervical disc degeneration, unspecified cervical region: Secondary | ICD-10-CM | POA: Diagnosis not present

## 2022-06-05 DIAGNOSIS — Z7982 Long term (current) use of aspirin: Secondary | ICD-10-CM | POA: Diagnosis not present

## 2022-06-05 DIAGNOSIS — Z9049 Acquired absence of other specified parts of digestive tract: Secondary | ICD-10-CM | POA: Diagnosis not present

## 2022-06-05 DIAGNOSIS — Z7902 Long term (current) use of antithrombotics/antiplatelets: Secondary | ICD-10-CM | POA: Diagnosis not present

## 2022-06-05 DIAGNOSIS — I69392 Facial weakness following cerebral infarction: Secondary | ICD-10-CM | POA: Diagnosis not present

## 2022-06-05 DIAGNOSIS — I700 Atherosclerosis of aorta: Secondary | ICD-10-CM | POA: Diagnosis not present

## 2022-06-05 DIAGNOSIS — I10 Essential (primary) hypertension: Secondary | ICD-10-CM | POA: Diagnosis not present

## 2022-06-05 DIAGNOSIS — R7303 Prediabetes: Secondary | ICD-10-CM | POA: Diagnosis not present

## 2022-06-05 DIAGNOSIS — H409 Unspecified glaucoma: Secondary | ICD-10-CM | POA: Diagnosis not present

## 2022-06-05 DIAGNOSIS — Z993 Dependence on wheelchair: Secondary | ICD-10-CM | POA: Diagnosis not present

## 2022-06-05 DIAGNOSIS — G8929 Other chronic pain: Secondary | ICD-10-CM | POA: Diagnosis not present

## 2022-06-05 DIAGNOSIS — Z9181 History of falling: Secondary | ICD-10-CM | POA: Diagnosis not present

## 2022-06-05 DIAGNOSIS — M1990 Unspecified osteoarthritis, unspecified site: Secondary | ICD-10-CM | POA: Diagnosis not present

## 2022-06-05 DIAGNOSIS — E785 Hyperlipidemia, unspecified: Secondary | ICD-10-CM | POA: Diagnosis not present

## 2022-06-05 DIAGNOSIS — I351 Nonrheumatic aortic (valve) insufficiency: Secondary | ICD-10-CM | POA: Diagnosis not present

## 2022-06-05 DIAGNOSIS — I69320 Aphasia following cerebral infarction: Secondary | ICD-10-CM | POA: Diagnosis not present

## 2022-06-05 DIAGNOSIS — I69351 Hemiplegia and hemiparesis following cerebral infarction affecting right dominant side: Secondary | ICD-10-CM | POA: Diagnosis not present

## 2022-06-05 DIAGNOSIS — I69391 Dysphagia following cerebral infarction: Secondary | ICD-10-CM | POA: Diagnosis not present

## 2022-06-05 DIAGNOSIS — Z981 Arthrodesis status: Secondary | ICD-10-CM | POA: Diagnosis not present

## 2022-06-05 DIAGNOSIS — M5412 Radiculopathy, cervical region: Secondary | ICD-10-CM | POA: Diagnosis not present

## 2022-06-06 DIAGNOSIS — I351 Nonrheumatic aortic (valve) insufficiency: Secondary | ICD-10-CM | POA: Diagnosis not present

## 2022-06-06 DIAGNOSIS — H409 Unspecified glaucoma: Secondary | ICD-10-CM | POA: Diagnosis not present

## 2022-06-06 DIAGNOSIS — Z9049 Acquired absence of other specified parts of digestive tract: Secondary | ICD-10-CM | POA: Diagnosis not present

## 2022-06-06 DIAGNOSIS — Z9181 History of falling: Secondary | ICD-10-CM | POA: Diagnosis not present

## 2022-06-06 DIAGNOSIS — I69391 Dysphagia following cerebral infarction: Secondary | ICD-10-CM | POA: Diagnosis not present

## 2022-06-06 DIAGNOSIS — Z7982 Long term (current) use of aspirin: Secondary | ICD-10-CM | POA: Diagnosis not present

## 2022-06-06 DIAGNOSIS — I700 Atherosclerosis of aorta: Secondary | ICD-10-CM | POA: Diagnosis not present

## 2022-06-06 DIAGNOSIS — I69351 Hemiplegia and hemiparesis following cerebral infarction affecting right dominant side: Secondary | ICD-10-CM | POA: Diagnosis not present

## 2022-06-06 DIAGNOSIS — E785 Hyperlipidemia, unspecified: Secondary | ICD-10-CM | POA: Diagnosis not present

## 2022-06-06 DIAGNOSIS — Z993 Dependence on wheelchair: Secondary | ICD-10-CM | POA: Diagnosis not present

## 2022-06-06 DIAGNOSIS — I69392 Facial weakness following cerebral infarction: Secondary | ICD-10-CM | POA: Diagnosis not present

## 2022-06-06 DIAGNOSIS — E43 Unspecified severe protein-calorie malnutrition: Secondary | ICD-10-CM | POA: Diagnosis not present

## 2022-06-06 DIAGNOSIS — R7303 Prediabetes: Secondary | ICD-10-CM | POA: Diagnosis not present

## 2022-06-06 DIAGNOSIS — M5412 Radiculopathy, cervical region: Secondary | ICD-10-CM | POA: Diagnosis not present

## 2022-06-06 DIAGNOSIS — G8929 Other chronic pain: Secondary | ICD-10-CM | POA: Diagnosis not present

## 2022-06-06 DIAGNOSIS — Z981 Arthrodesis status: Secondary | ICD-10-CM | POA: Diagnosis not present

## 2022-06-06 DIAGNOSIS — M5030 Other cervical disc degeneration, unspecified cervical region: Secondary | ICD-10-CM | POA: Diagnosis not present

## 2022-06-06 DIAGNOSIS — I10 Essential (primary) hypertension: Secondary | ICD-10-CM | POA: Diagnosis not present

## 2022-06-06 DIAGNOSIS — M1990 Unspecified osteoarthritis, unspecified site: Secondary | ICD-10-CM | POA: Diagnosis not present

## 2022-06-06 DIAGNOSIS — R1310 Dysphagia, unspecified: Secondary | ICD-10-CM | POA: Diagnosis not present

## 2022-06-06 DIAGNOSIS — U071 COVID-19: Secondary | ICD-10-CM | POA: Diagnosis not present

## 2022-06-06 DIAGNOSIS — Z7902 Long term (current) use of antithrombotics/antiplatelets: Secondary | ICD-10-CM | POA: Diagnosis not present

## 2022-06-06 DIAGNOSIS — I69320 Aphasia following cerebral infarction: Secondary | ICD-10-CM | POA: Diagnosis not present

## 2022-06-09 DIAGNOSIS — E785 Hyperlipidemia, unspecified: Secondary | ICD-10-CM | POA: Diagnosis not present

## 2022-06-09 DIAGNOSIS — E43 Unspecified severe protein-calorie malnutrition: Secondary | ICD-10-CM | POA: Diagnosis not present

## 2022-06-09 DIAGNOSIS — I351 Nonrheumatic aortic (valve) insufficiency: Secondary | ICD-10-CM | POA: Diagnosis not present

## 2022-06-09 DIAGNOSIS — Z9049 Acquired absence of other specified parts of digestive tract: Secondary | ICD-10-CM | POA: Diagnosis not present

## 2022-06-09 DIAGNOSIS — I63512 Cerebral infarction due to unspecified occlusion or stenosis of left middle cerebral artery: Secondary | ICD-10-CM | POA: Diagnosis not present

## 2022-06-09 DIAGNOSIS — I69392 Facial weakness following cerebral infarction: Secondary | ICD-10-CM | POA: Diagnosis not present

## 2022-06-09 DIAGNOSIS — Z7982 Long term (current) use of aspirin: Secondary | ICD-10-CM | POA: Diagnosis not present

## 2022-06-09 DIAGNOSIS — M5030 Other cervical disc degeneration, unspecified cervical region: Secondary | ICD-10-CM | POA: Diagnosis not present

## 2022-06-09 DIAGNOSIS — Z9181 History of falling: Secondary | ICD-10-CM | POA: Diagnosis not present

## 2022-06-09 DIAGNOSIS — M5412 Radiculopathy, cervical region: Secondary | ICD-10-CM | POA: Diagnosis not present

## 2022-06-09 DIAGNOSIS — I69320 Aphasia following cerebral infarction: Secondary | ICD-10-CM | POA: Diagnosis not present

## 2022-06-09 DIAGNOSIS — I700 Atherosclerosis of aorta: Secondary | ICD-10-CM | POA: Diagnosis not present

## 2022-06-09 DIAGNOSIS — I69391 Dysphagia following cerebral infarction: Secondary | ICD-10-CM | POA: Diagnosis not present

## 2022-06-09 DIAGNOSIS — R7303 Prediabetes: Secondary | ICD-10-CM | POA: Diagnosis not present

## 2022-06-09 DIAGNOSIS — I69351 Hemiplegia and hemiparesis following cerebral infarction affecting right dominant side: Secondary | ICD-10-CM | POA: Diagnosis not present

## 2022-06-09 DIAGNOSIS — U071 COVID-19: Secondary | ICD-10-CM | POA: Diagnosis not present

## 2022-06-09 DIAGNOSIS — H409 Unspecified glaucoma: Secondary | ICD-10-CM | POA: Diagnosis not present

## 2022-06-09 DIAGNOSIS — R1310 Dysphagia, unspecified: Secondary | ICD-10-CM | POA: Diagnosis not present

## 2022-06-09 DIAGNOSIS — Z993 Dependence on wheelchair: Secondary | ICD-10-CM | POA: Diagnosis not present

## 2022-06-09 DIAGNOSIS — Z981 Arthrodesis status: Secondary | ICD-10-CM | POA: Diagnosis not present

## 2022-06-09 DIAGNOSIS — Z7902 Long term (current) use of antithrombotics/antiplatelets: Secondary | ICD-10-CM | POA: Diagnosis not present

## 2022-06-09 DIAGNOSIS — I10 Essential (primary) hypertension: Secondary | ICD-10-CM | POA: Diagnosis not present

## 2022-06-09 DIAGNOSIS — G8929 Other chronic pain: Secondary | ICD-10-CM | POA: Diagnosis not present

## 2022-06-09 DIAGNOSIS — M1990 Unspecified osteoarthritis, unspecified site: Secondary | ICD-10-CM | POA: Diagnosis not present

## 2022-06-10 DIAGNOSIS — I63512 Cerebral infarction due to unspecified occlusion or stenosis of left middle cerebral artery: Secondary | ICD-10-CM | POA: Diagnosis not present

## 2022-06-11 DIAGNOSIS — E785 Hyperlipidemia, unspecified: Secondary | ICD-10-CM | POA: Diagnosis not present

## 2022-06-11 DIAGNOSIS — Z9181 History of falling: Secondary | ICD-10-CM | POA: Diagnosis not present

## 2022-06-11 DIAGNOSIS — M1990 Unspecified osteoarthritis, unspecified site: Secondary | ICD-10-CM | POA: Diagnosis not present

## 2022-06-11 DIAGNOSIS — R7303 Prediabetes: Secondary | ICD-10-CM | POA: Diagnosis not present

## 2022-06-11 DIAGNOSIS — E43 Unspecified severe protein-calorie malnutrition: Secondary | ICD-10-CM | POA: Diagnosis not present

## 2022-06-11 DIAGNOSIS — Z993 Dependence on wheelchair: Secondary | ICD-10-CM | POA: Diagnosis not present

## 2022-06-11 DIAGNOSIS — I351 Nonrheumatic aortic (valve) insufficiency: Secondary | ICD-10-CM | POA: Diagnosis not present

## 2022-06-11 DIAGNOSIS — I10 Essential (primary) hypertension: Secondary | ICD-10-CM | POA: Diagnosis not present

## 2022-06-11 DIAGNOSIS — M5412 Radiculopathy, cervical region: Secondary | ICD-10-CM | POA: Diagnosis not present

## 2022-06-11 DIAGNOSIS — R1310 Dysphagia, unspecified: Secondary | ICD-10-CM | POA: Diagnosis not present

## 2022-06-11 DIAGNOSIS — I700 Atherosclerosis of aorta: Secondary | ICD-10-CM | POA: Diagnosis not present

## 2022-06-11 DIAGNOSIS — I69320 Aphasia following cerebral infarction: Secondary | ICD-10-CM | POA: Diagnosis not present

## 2022-06-11 DIAGNOSIS — G8929 Other chronic pain: Secondary | ICD-10-CM | POA: Diagnosis not present

## 2022-06-11 DIAGNOSIS — Z7982 Long term (current) use of aspirin: Secondary | ICD-10-CM | POA: Diagnosis not present

## 2022-06-11 DIAGNOSIS — I69351 Hemiplegia and hemiparesis following cerebral infarction affecting right dominant side: Secondary | ICD-10-CM | POA: Diagnosis not present

## 2022-06-11 DIAGNOSIS — Z7902 Long term (current) use of antithrombotics/antiplatelets: Secondary | ICD-10-CM | POA: Diagnosis not present

## 2022-06-11 DIAGNOSIS — Z981 Arthrodesis status: Secondary | ICD-10-CM | POA: Diagnosis not present

## 2022-06-11 DIAGNOSIS — H409 Unspecified glaucoma: Secondary | ICD-10-CM | POA: Diagnosis not present

## 2022-06-11 DIAGNOSIS — I69391 Dysphagia following cerebral infarction: Secondary | ICD-10-CM | POA: Diagnosis not present

## 2022-06-11 DIAGNOSIS — U071 COVID-19: Secondary | ICD-10-CM | POA: Diagnosis not present

## 2022-06-11 DIAGNOSIS — M5030 Other cervical disc degeneration, unspecified cervical region: Secondary | ICD-10-CM | POA: Diagnosis not present

## 2022-06-11 DIAGNOSIS — I69392 Facial weakness following cerebral infarction: Secondary | ICD-10-CM | POA: Diagnosis not present

## 2022-06-11 DIAGNOSIS — Z9049 Acquired absence of other specified parts of digestive tract: Secondary | ICD-10-CM | POA: Diagnosis not present

## 2022-06-12 DIAGNOSIS — G8929 Other chronic pain: Secondary | ICD-10-CM | POA: Diagnosis not present

## 2022-06-12 DIAGNOSIS — R7303 Prediabetes: Secondary | ICD-10-CM | POA: Diagnosis not present

## 2022-06-12 DIAGNOSIS — M5030 Other cervical disc degeneration, unspecified cervical region: Secondary | ICD-10-CM | POA: Diagnosis not present

## 2022-06-12 DIAGNOSIS — M1990 Unspecified osteoarthritis, unspecified site: Secondary | ICD-10-CM | POA: Diagnosis not present

## 2022-06-12 DIAGNOSIS — H409 Unspecified glaucoma: Secondary | ICD-10-CM | POA: Diagnosis not present

## 2022-06-12 DIAGNOSIS — I69391 Dysphagia following cerebral infarction: Secondary | ICD-10-CM | POA: Diagnosis not present

## 2022-06-12 DIAGNOSIS — Z9181 History of falling: Secondary | ICD-10-CM | POA: Diagnosis not present

## 2022-06-12 DIAGNOSIS — I69351 Hemiplegia and hemiparesis following cerebral infarction affecting right dominant side: Secondary | ICD-10-CM | POA: Diagnosis not present

## 2022-06-12 DIAGNOSIS — I69320 Aphasia following cerebral infarction: Secondary | ICD-10-CM | POA: Diagnosis not present

## 2022-06-12 DIAGNOSIS — R1310 Dysphagia, unspecified: Secondary | ICD-10-CM | POA: Diagnosis not present

## 2022-06-12 DIAGNOSIS — Z993 Dependence on wheelchair: Secondary | ICD-10-CM | POA: Diagnosis not present

## 2022-06-12 DIAGNOSIS — M5412 Radiculopathy, cervical region: Secondary | ICD-10-CM | POA: Diagnosis not present

## 2022-06-12 DIAGNOSIS — E785 Hyperlipidemia, unspecified: Secondary | ICD-10-CM | POA: Diagnosis not present

## 2022-06-12 DIAGNOSIS — Z981 Arthrodesis status: Secondary | ICD-10-CM | POA: Diagnosis not present

## 2022-06-12 DIAGNOSIS — I351 Nonrheumatic aortic (valve) insufficiency: Secondary | ICD-10-CM | POA: Diagnosis not present

## 2022-06-12 DIAGNOSIS — I10 Essential (primary) hypertension: Secondary | ICD-10-CM | POA: Diagnosis not present

## 2022-06-12 DIAGNOSIS — Z7902 Long term (current) use of antithrombotics/antiplatelets: Secondary | ICD-10-CM | POA: Diagnosis not present

## 2022-06-12 DIAGNOSIS — I69392 Facial weakness following cerebral infarction: Secondary | ICD-10-CM | POA: Diagnosis not present

## 2022-06-12 DIAGNOSIS — Z7982 Long term (current) use of aspirin: Secondary | ICD-10-CM | POA: Diagnosis not present

## 2022-06-12 DIAGNOSIS — Z9049 Acquired absence of other specified parts of digestive tract: Secondary | ICD-10-CM | POA: Diagnosis not present

## 2022-06-12 DIAGNOSIS — I700 Atherosclerosis of aorta: Secondary | ICD-10-CM | POA: Diagnosis not present

## 2022-06-12 DIAGNOSIS — U071 COVID-19: Secondary | ICD-10-CM | POA: Diagnosis not present

## 2022-06-12 DIAGNOSIS — E43 Unspecified severe protein-calorie malnutrition: Secondary | ICD-10-CM | POA: Diagnosis not present

## 2022-06-13 DIAGNOSIS — I69351 Hemiplegia and hemiparesis following cerebral infarction affecting right dominant side: Secondary | ICD-10-CM | POA: Diagnosis not present

## 2022-06-13 DIAGNOSIS — Z7982 Long term (current) use of aspirin: Secondary | ICD-10-CM | POA: Diagnosis not present

## 2022-06-13 DIAGNOSIS — I700 Atherosclerosis of aorta: Secondary | ICD-10-CM | POA: Diagnosis not present

## 2022-06-13 DIAGNOSIS — H409 Unspecified glaucoma: Secondary | ICD-10-CM | POA: Diagnosis not present

## 2022-06-13 DIAGNOSIS — G8929 Other chronic pain: Secondary | ICD-10-CM | POA: Diagnosis not present

## 2022-06-13 DIAGNOSIS — U071 COVID-19: Secondary | ICD-10-CM | POA: Diagnosis not present

## 2022-06-13 DIAGNOSIS — I69320 Aphasia following cerebral infarction: Secondary | ICD-10-CM | POA: Diagnosis not present

## 2022-06-13 DIAGNOSIS — M5412 Radiculopathy, cervical region: Secondary | ICD-10-CM | POA: Diagnosis not present

## 2022-06-13 DIAGNOSIS — E43 Unspecified severe protein-calorie malnutrition: Secondary | ICD-10-CM | POA: Diagnosis not present

## 2022-06-13 DIAGNOSIS — Z7902 Long term (current) use of antithrombotics/antiplatelets: Secondary | ICD-10-CM | POA: Diagnosis not present

## 2022-06-13 DIAGNOSIS — I10 Essential (primary) hypertension: Secondary | ICD-10-CM | POA: Diagnosis not present

## 2022-06-13 DIAGNOSIS — Z993 Dependence on wheelchair: Secondary | ICD-10-CM | POA: Diagnosis not present

## 2022-06-13 DIAGNOSIS — Z9049 Acquired absence of other specified parts of digestive tract: Secondary | ICD-10-CM | POA: Diagnosis not present

## 2022-06-13 DIAGNOSIS — Z981 Arthrodesis status: Secondary | ICD-10-CM | POA: Diagnosis not present

## 2022-06-13 DIAGNOSIS — Z9181 History of falling: Secondary | ICD-10-CM | POA: Diagnosis not present

## 2022-06-13 DIAGNOSIS — M1990 Unspecified osteoarthritis, unspecified site: Secondary | ICD-10-CM | POA: Diagnosis not present

## 2022-06-13 DIAGNOSIS — R1310 Dysphagia, unspecified: Secondary | ICD-10-CM | POA: Diagnosis not present

## 2022-06-13 DIAGNOSIS — I69392 Facial weakness following cerebral infarction: Secondary | ICD-10-CM | POA: Diagnosis not present

## 2022-06-13 DIAGNOSIS — R7303 Prediabetes: Secondary | ICD-10-CM | POA: Diagnosis not present

## 2022-06-13 DIAGNOSIS — M5030 Other cervical disc degeneration, unspecified cervical region: Secondary | ICD-10-CM | POA: Diagnosis not present

## 2022-06-13 DIAGNOSIS — I351 Nonrheumatic aortic (valve) insufficiency: Secondary | ICD-10-CM | POA: Diagnosis not present

## 2022-06-13 DIAGNOSIS — E785 Hyperlipidemia, unspecified: Secondary | ICD-10-CM | POA: Diagnosis not present

## 2022-06-13 DIAGNOSIS — I69391 Dysphagia following cerebral infarction: Secondary | ICD-10-CM | POA: Diagnosis not present

## 2022-06-17 ENCOUNTER — Telehealth: Payer: Self-pay | Admitting: Family Medicine

## 2022-06-17 DIAGNOSIS — Z993 Dependence on wheelchair: Secondary | ICD-10-CM | POA: Diagnosis not present

## 2022-06-17 DIAGNOSIS — E43 Unspecified severe protein-calorie malnutrition: Secondary | ICD-10-CM | POA: Diagnosis not present

## 2022-06-17 DIAGNOSIS — I351 Nonrheumatic aortic (valve) insufficiency: Secondary | ICD-10-CM | POA: Diagnosis not present

## 2022-06-17 DIAGNOSIS — Z7902 Long term (current) use of antithrombotics/antiplatelets: Secondary | ICD-10-CM | POA: Diagnosis not present

## 2022-06-17 DIAGNOSIS — R1310 Dysphagia, unspecified: Secondary | ICD-10-CM | POA: Diagnosis not present

## 2022-06-17 DIAGNOSIS — I69320 Aphasia following cerebral infarction: Secondary | ICD-10-CM | POA: Diagnosis not present

## 2022-06-17 DIAGNOSIS — E785 Hyperlipidemia, unspecified: Secondary | ICD-10-CM | POA: Diagnosis not present

## 2022-06-17 DIAGNOSIS — I69351 Hemiplegia and hemiparesis following cerebral infarction affecting right dominant side: Secondary | ICD-10-CM | POA: Diagnosis not present

## 2022-06-17 DIAGNOSIS — I700 Atherosclerosis of aorta: Secondary | ICD-10-CM | POA: Diagnosis not present

## 2022-06-17 DIAGNOSIS — M5030 Other cervical disc degeneration, unspecified cervical region: Secondary | ICD-10-CM | POA: Diagnosis not present

## 2022-06-17 DIAGNOSIS — R7303 Prediabetes: Secondary | ICD-10-CM | POA: Diagnosis not present

## 2022-06-17 DIAGNOSIS — I10 Essential (primary) hypertension: Secondary | ICD-10-CM | POA: Diagnosis not present

## 2022-06-17 DIAGNOSIS — Z7982 Long term (current) use of aspirin: Secondary | ICD-10-CM | POA: Diagnosis not present

## 2022-06-17 DIAGNOSIS — Z981 Arthrodesis status: Secondary | ICD-10-CM | POA: Diagnosis not present

## 2022-06-17 DIAGNOSIS — H409 Unspecified glaucoma: Secondary | ICD-10-CM | POA: Diagnosis not present

## 2022-06-17 DIAGNOSIS — M5412 Radiculopathy, cervical region: Secondary | ICD-10-CM | POA: Diagnosis not present

## 2022-06-17 DIAGNOSIS — U071 COVID-19: Secondary | ICD-10-CM | POA: Diagnosis not present

## 2022-06-17 DIAGNOSIS — G8929 Other chronic pain: Secondary | ICD-10-CM | POA: Diagnosis not present

## 2022-06-17 DIAGNOSIS — Z9049 Acquired absence of other specified parts of digestive tract: Secondary | ICD-10-CM | POA: Diagnosis not present

## 2022-06-17 DIAGNOSIS — I69392 Facial weakness following cerebral infarction: Secondary | ICD-10-CM | POA: Diagnosis not present

## 2022-06-17 DIAGNOSIS — M1990 Unspecified osteoarthritis, unspecified site: Secondary | ICD-10-CM | POA: Diagnosis not present

## 2022-06-17 DIAGNOSIS — I69391 Dysphagia following cerebral infarction: Secondary | ICD-10-CM | POA: Diagnosis not present

## 2022-06-17 DIAGNOSIS — Z9181 History of falling: Secondary | ICD-10-CM | POA: Diagnosis not present

## 2022-06-18 ENCOUNTER — Telehealth (INDEPENDENT_AMBULATORY_CARE_PROVIDER_SITE_OTHER): Payer: Medicare Other | Admitting: Family Medicine

## 2022-06-18 ENCOUNTER — Encounter: Payer: Self-pay | Admitting: Family Medicine

## 2022-06-18 DIAGNOSIS — U071 COVID-19: Secondary | ICD-10-CM | POA: Diagnosis not present

## 2022-06-18 DIAGNOSIS — R7303 Prediabetes: Secondary | ICD-10-CM | POA: Diagnosis not present

## 2022-06-18 DIAGNOSIS — Z7982 Long term (current) use of aspirin: Secondary | ICD-10-CM | POA: Diagnosis not present

## 2022-06-18 DIAGNOSIS — I69391 Dysphagia following cerebral infarction: Secondary | ICD-10-CM | POA: Diagnosis not present

## 2022-06-18 DIAGNOSIS — F4321 Adjustment disorder with depressed mood: Secondary | ICD-10-CM | POA: Diagnosis not present

## 2022-06-18 DIAGNOSIS — E43 Unspecified severe protein-calorie malnutrition: Secondary | ICD-10-CM | POA: Diagnosis not present

## 2022-06-18 DIAGNOSIS — H409 Unspecified glaucoma: Secondary | ICD-10-CM | POA: Diagnosis not present

## 2022-06-18 DIAGNOSIS — Z981 Arthrodesis status: Secondary | ICD-10-CM | POA: Diagnosis not present

## 2022-06-18 DIAGNOSIS — I10 Essential (primary) hypertension: Secondary | ICD-10-CM | POA: Diagnosis not present

## 2022-06-18 DIAGNOSIS — Z9181 History of falling: Secondary | ICD-10-CM | POA: Diagnosis not present

## 2022-06-18 DIAGNOSIS — G8929 Other chronic pain: Secondary | ICD-10-CM | POA: Diagnosis not present

## 2022-06-18 DIAGNOSIS — Z993 Dependence on wheelchair: Secondary | ICD-10-CM | POA: Diagnosis not present

## 2022-06-18 DIAGNOSIS — R1310 Dysphagia, unspecified: Secondary | ICD-10-CM | POA: Diagnosis not present

## 2022-06-18 DIAGNOSIS — I351 Nonrheumatic aortic (valve) insufficiency: Secondary | ICD-10-CM | POA: Diagnosis not present

## 2022-06-18 DIAGNOSIS — E785 Hyperlipidemia, unspecified: Secondary | ICD-10-CM | POA: Diagnosis not present

## 2022-06-18 DIAGNOSIS — M5030 Other cervical disc degeneration, unspecified cervical region: Secondary | ICD-10-CM | POA: Diagnosis not present

## 2022-06-18 DIAGNOSIS — I69392 Facial weakness following cerebral infarction: Secondary | ICD-10-CM | POA: Diagnosis not present

## 2022-06-18 DIAGNOSIS — I69320 Aphasia following cerebral infarction: Secondary | ICD-10-CM | POA: Diagnosis not present

## 2022-06-18 DIAGNOSIS — Z7902 Long term (current) use of antithrombotics/antiplatelets: Secondary | ICD-10-CM | POA: Diagnosis not present

## 2022-06-18 DIAGNOSIS — Z9049 Acquired absence of other specified parts of digestive tract: Secondary | ICD-10-CM | POA: Diagnosis not present

## 2022-06-18 DIAGNOSIS — M1990 Unspecified osteoarthritis, unspecified site: Secondary | ICD-10-CM | POA: Diagnosis not present

## 2022-06-18 DIAGNOSIS — M5412 Radiculopathy, cervical region: Secondary | ICD-10-CM | POA: Diagnosis not present

## 2022-06-18 DIAGNOSIS — I69351 Hemiplegia and hemiparesis following cerebral infarction affecting right dominant side: Secondary | ICD-10-CM | POA: Diagnosis not present

## 2022-06-18 DIAGNOSIS — I700 Atherosclerosis of aorta: Secondary | ICD-10-CM | POA: Diagnosis not present

## 2022-06-18 NOTE — Telephone Encounter (Signed)
Appointment scheduled.

## 2022-06-18 NOTE — Progress Notes (Signed)
Virtual Visit via MyChart Video Note Due to COVID-19 pandemic this visit was conducted virtually. This visit type was conducted due to national recommendations for restrictions regarding the COVID-19 Pandemic (e.g. social distancing, sheltering in place) in an effort to limit this patient's exposure and mitigate transmission in our community. All issues noted in this document were discussed and addressed.  A physical exam was not performed with this format.   I connected with Paul Bradshaw on 06/18/2022 at 1118 by MyChart Video and verified that I am speaking with the correct person using two identifiers. Paul Bradshaw is currently located at home and  son  is currently with them during visit. The provider, Kari Baars, FNP is located in their office at time of visit.  I discussed the limitations, risks, security and privacy concerns of performing an evaluation and management service by virtual visit and the availability of in person appointments. I also discussed with the patient that there may be a patient responsible charge related to this service. The patient expressed understanding and agreed to proceed.  Subjective:  Patient ID: Paul Bradshaw, male    DOB: 05-31-1926, 88 y.o.   MRN: [id number]  Chief Complaint:  Grief   HPI: SIXTO Bradshaw is a 88 y.o. male presenting on 06/18/2022 for Grief   Son reports pts wife passed away yesterday and the pt has been upset. States he can be agitated at times. Pt remains nonverbal, residual from recent CVA. Pt is sitting on chair during visit. No aggression or agitation noted.      Relevant past medical, surgical, family, and social history reviewed and updated as indicated.  Allergies and medications reviewed and updated.   Past Medical History:  Diagnosis Date   Aortic regurgitation    Moderate   Arthritis    BPH (benign prostatic hyperplasia)    Cervical disc disease    Cervical radiculopathy    Hyperlipidemia    Hypertension     Prediabetes 02/16/2021   Staphylococcus aureus bacteremia 08/09/2012   TEE negative for vegetation or thrombus February 2014   Stroke [facility]) 2005 or 2006   3   Symptomatic carotid artery stenosis with infarction [facility]) 2005 or 2006   Status post right carotid endarterectomy    Past Surgical History:  Procedure Laterality Date   BACK SURGERY     CAROTID ENDARTERECTOMY     CATARACT EXTRACTION W/PHACO Left 02/15/2015   Procedure: CATARACT EXTRACTION PHACO AND INTRAOCULAR LENS PLACEMENT LEFT EYE CDE=30.93;  Surgeon: Gemma Payor, MD;  Location: [facility];  Service: Ophthalmology;  Laterality: Left;   CHOLECYSTECTOMY     EYE SURGERY     KIDNEY STONE SURGERY     LUMBAR LAMINECTOMY/DECOMPRESSION MICRODISCECTOMY Bilateral 01/23/2014   Procedure: LUMBAR LAMINECTOMY/DECOMPRESSION MICRODISCECTOMY 1 LEVEL L5-S1;  Surgeon: Temple Pacini, MD;  Location: [facility] NE[facility];  Service: Neurosurgery;  Laterality: Bilateral;  LUMBAR LAMINECTOMY/DECOMPRESSION MICRODISCECTOMY 1 LEVEL L5-S1   TEE WITHOUT CARDIOVERSION N/A 08/12/2012   Procedure: TRANSESOPHAGEAL ECHOCARDIOGRAM (TEE);  Surgeon: Wendall Stade, MD;  Location[facility];  Service: Cardiovascular;  Laterality: N/A;   TEE WITHOUT CARDIOVERSION N/A 06/24/2013   Procedure: TRANSESOPHAGEAL ECHOCARDIOGRAM (TEE);  Surgeon: Jonelle Sidle, MD;  Location: [facility];  Service: Endoscopy;  Laterality: N/A;    Social History   Socioeconomic History   Marital status: Married    Spouse name: Not on file   Number of children: Not on file   Years of education: Not on file  Highest education level: Not on file  Occupational History   Not on file  Tobacco Use   Smoking status: Never   Smokeless tobacco: Never  Vaping Use   Vaping Use: Never used  Substance and Sexual Activity   Alcohol use: No   Drug use: No   Sexual activity: Not Currently  Other Topics Concern   Not on file  Social History Narrative   Not on file   Social Determinants of  Health   Financial Resource Strain: Not on file  Food Insecurity: No Food Insecurity (05/10/2022)   Hunger Vital Sign    Worried About Running Out of Food in the Last Year: Never true    Ran Out of Food in the Last Year: Never true  Transportation Needs: No Transportation Needs (05/10/2022)   PRAPARE - Administrator, Civil Service (Medical): No    Lack of Transportation (Non-Medical): No  Physical Activity: Not on file  Stress: Not on file  Social Connections: Not on file  Intimate Partner Violence: Not At Risk (05/10/2022)   Humiliation, Afraid, Rape, and Kick questionnaire    Fear of Current or Ex-Partner: No    Emotionally Abused: No    Physically Abused: No    Sexually Abused: No    Outpatient Encounter Medications as of 06/18/2022  Medication Sig   acetaminophen (TYLENOL) 325 MG tablet Take 1-2 tablets (325-650 mg total) by mouth every 4 (four) hours as needed for mild pain.   albuterol (VENTOLIN HFA) 108 (90 Base) MCG/ACT inhaler Inhale 2 puffs into the lungs every 6 (six) hours as needed for shortness of breath or wheezing.   aspirin EC 81 MG tablet Take 1 tablet (81 mg total) by mouth daily with breakfast.   atorvastatin (LIPITOR) 20 MG tablet Take 1 tablet (20 mg total) by mouth daily.   brimonidine (ALPHAGAN) 0.2 % ophthalmic solution Place 1 drop into both eyes 3 (three) times daily.   clopidogrel (PLAVIX) 75 MG tablet Take 1 tablet (75 mg total) by mouth daily with breakfast.   famotidine (PEPCID) 20 MG tablet TAKE ONE TABLET TWICE DAILY   latanoprost (XALATAN) 0.005 % ophthalmic solution Place 1 drop into both eyes every evening.   metoprolol tartrate (LOPRESSOR) 25 MG tablet Take 1 tablet (25 mg total) by mouth 2 (two) times daily.   No facility-administered encounter medications on file as of 06/18/2022.    Allergies  Allergen Reactions   Amlodipine Swelling    Swelling of lips.   Lisinopril Other (See Comments)    Elevated BP higher & causes a  burning feeling on the inside.     Review of Systems  Unable to perform ROS: Patient nonverbal (per son)  Psychiatric/Behavioral:  Positive for agitation and behavioral problems.          Observations/Objective: No vital signs or physical exam, this was a virtual health encounter.  Pt alert, nonverbal. Not agitated during visit.    Assessment and Plan: Jospeh was seen today for grief.  Diagnoses and all orders for this visit:  Grief reaction Discussed the stages of grief in detail with son. Aware to notify provider if symptoms progress or worsen.     Follow Up Instructions: Return if symptoms worsen or fail to improve.    I discussed the assessment and treatment plan with the patient. The patient was provided an opportunity to ask questions and all were answered. The patient agreed with the plan and demonstrated an understanding of the instructions.  The patient was advised to call back or seek an in-person evaluation if the symptoms worsen or if the condition fails to improve as anticipated.  The above assessment and management plan was discussed with the patient. The patient verbalized understanding of and has agreed to the management plan. Patient is aware to call the clinic if they develop any new symptoms or if symptoms persist or worsen. Patient is aware when to return to the clinic for a follow-up visit. Patient educated on when it is appropriate to go to the emergency department.    I provided 15 minutes of time during this MyChart Video encounter.   Kari Baars, FNP-C [facility] Medicine [address] [place], [place] [address] [phone] 06/18/2022

## 2022-06-20 DIAGNOSIS — I69392 Facial weakness following cerebral infarction: Secondary | ICD-10-CM | POA: Diagnosis not present

## 2022-06-20 DIAGNOSIS — I700 Atherosclerosis of aorta: Secondary | ICD-10-CM | POA: Diagnosis not present

## 2022-06-20 DIAGNOSIS — I69351 Hemiplegia and hemiparesis following cerebral infarction affecting right dominant side: Secondary | ICD-10-CM | POA: Diagnosis not present

## 2022-06-20 DIAGNOSIS — R7303 Prediabetes: Secondary | ICD-10-CM | POA: Diagnosis not present

## 2022-06-20 DIAGNOSIS — I69391 Dysphagia following cerebral infarction: Secondary | ICD-10-CM | POA: Diagnosis not present

## 2022-06-20 DIAGNOSIS — Z7902 Long term (current) use of antithrombotics/antiplatelets: Secondary | ICD-10-CM | POA: Diagnosis not present

## 2022-06-20 DIAGNOSIS — Z9049 Acquired absence of other specified parts of digestive tract: Secondary | ICD-10-CM | POA: Diagnosis not present

## 2022-06-20 DIAGNOSIS — I69320 Aphasia following cerebral infarction: Secondary | ICD-10-CM | POA: Diagnosis not present

## 2022-06-20 DIAGNOSIS — Z981 Arthrodesis status: Secondary | ICD-10-CM | POA: Diagnosis not present

## 2022-06-20 DIAGNOSIS — M1990 Unspecified osteoarthritis, unspecified site: Secondary | ICD-10-CM | POA: Diagnosis not present

## 2022-06-20 DIAGNOSIS — I10 Essential (primary) hypertension: Secondary | ICD-10-CM | POA: Diagnosis not present

## 2022-06-20 DIAGNOSIS — U071 COVID-19: Secondary | ICD-10-CM | POA: Diagnosis not present

## 2022-06-20 DIAGNOSIS — M5030 Other cervical disc degeneration, unspecified cervical region: Secondary | ICD-10-CM | POA: Diagnosis not present

## 2022-06-20 DIAGNOSIS — M5412 Radiculopathy, cervical region: Secondary | ICD-10-CM | POA: Diagnosis not present

## 2022-06-20 DIAGNOSIS — Z7982 Long term (current) use of aspirin: Secondary | ICD-10-CM | POA: Diagnosis not present

## 2022-06-20 DIAGNOSIS — R1310 Dysphagia, unspecified: Secondary | ICD-10-CM | POA: Diagnosis not present

## 2022-06-20 DIAGNOSIS — Z993 Dependence on wheelchair: Secondary | ICD-10-CM | POA: Diagnosis not present

## 2022-06-20 DIAGNOSIS — E43 Unspecified severe protein-calorie malnutrition: Secondary | ICD-10-CM | POA: Diagnosis not present

## 2022-06-20 DIAGNOSIS — Z9181 History of falling: Secondary | ICD-10-CM | POA: Diagnosis not present

## 2022-06-20 DIAGNOSIS — E785 Hyperlipidemia, unspecified: Secondary | ICD-10-CM | POA: Diagnosis not present

## 2022-06-20 DIAGNOSIS — G8929 Other chronic pain: Secondary | ICD-10-CM | POA: Diagnosis not present

## 2022-06-20 DIAGNOSIS — H409 Unspecified glaucoma: Secondary | ICD-10-CM | POA: Diagnosis not present

## 2022-06-20 DIAGNOSIS — I351 Nonrheumatic aortic (valve) insufficiency: Secondary | ICD-10-CM | POA: Diagnosis not present

## 2022-06-24 DIAGNOSIS — U071 COVID-19: Secondary | ICD-10-CM | POA: Diagnosis not present

## 2022-06-24 DIAGNOSIS — R7303 Prediabetes: Secondary | ICD-10-CM | POA: Diagnosis not present

## 2022-06-24 DIAGNOSIS — M5412 Radiculopathy, cervical region: Secondary | ICD-10-CM | POA: Diagnosis not present

## 2022-06-24 DIAGNOSIS — E785 Hyperlipidemia, unspecified: Secondary | ICD-10-CM | POA: Diagnosis not present

## 2022-06-24 DIAGNOSIS — E43 Unspecified severe protein-calorie malnutrition: Secondary | ICD-10-CM | POA: Diagnosis not present

## 2022-06-24 DIAGNOSIS — Z993 Dependence on wheelchair: Secondary | ICD-10-CM | POA: Diagnosis not present

## 2022-06-24 DIAGNOSIS — M5030 Other cervical disc degeneration, unspecified cervical region: Secondary | ICD-10-CM | POA: Diagnosis not present

## 2022-06-24 DIAGNOSIS — I69392 Facial weakness following cerebral infarction: Secondary | ICD-10-CM | POA: Diagnosis not present

## 2022-06-24 DIAGNOSIS — Z7902 Long term (current) use of antithrombotics/antiplatelets: Secondary | ICD-10-CM | POA: Diagnosis not present

## 2022-06-24 DIAGNOSIS — R1310 Dysphagia, unspecified: Secondary | ICD-10-CM | POA: Diagnosis not present

## 2022-06-24 DIAGNOSIS — I351 Nonrheumatic aortic (valve) insufficiency: Secondary | ICD-10-CM | POA: Diagnosis not present

## 2022-06-24 DIAGNOSIS — M1990 Unspecified osteoarthritis, unspecified site: Secondary | ICD-10-CM | POA: Diagnosis not present

## 2022-06-24 DIAGNOSIS — I69320 Aphasia following cerebral infarction: Secondary | ICD-10-CM | POA: Diagnosis not present

## 2022-06-24 DIAGNOSIS — Z9181 History of falling: Secondary | ICD-10-CM | POA: Diagnosis not present

## 2022-06-24 DIAGNOSIS — I69351 Hemiplegia and hemiparesis following cerebral infarction affecting right dominant side: Secondary | ICD-10-CM | POA: Diagnosis not present

## 2022-06-24 DIAGNOSIS — G8929 Other chronic pain: Secondary | ICD-10-CM | POA: Diagnosis not present

## 2022-06-24 DIAGNOSIS — Z9049 Acquired absence of other specified parts of digestive tract: Secondary | ICD-10-CM | POA: Diagnosis not present

## 2022-06-24 DIAGNOSIS — I700 Atherosclerosis of aorta: Secondary | ICD-10-CM | POA: Diagnosis not present

## 2022-06-24 DIAGNOSIS — I10 Essential (primary) hypertension: Secondary | ICD-10-CM | POA: Diagnosis not present

## 2022-06-24 DIAGNOSIS — Z981 Arthrodesis status: Secondary | ICD-10-CM | POA: Diagnosis not present

## 2022-06-24 DIAGNOSIS — H409 Unspecified glaucoma: Secondary | ICD-10-CM | POA: Diagnosis not present

## 2022-06-24 DIAGNOSIS — Z7982 Long term (current) use of aspirin: Secondary | ICD-10-CM | POA: Diagnosis not present

## 2022-06-24 DIAGNOSIS — I69391 Dysphagia following cerebral infarction: Secondary | ICD-10-CM | POA: Diagnosis not present

## 2022-06-25 DIAGNOSIS — G8929 Other chronic pain: Secondary | ICD-10-CM | POA: Diagnosis not present

## 2022-06-25 DIAGNOSIS — H409 Unspecified glaucoma: Secondary | ICD-10-CM | POA: Diagnosis not present

## 2022-06-25 DIAGNOSIS — I69351 Hemiplegia and hemiparesis following cerebral infarction affecting right dominant side: Secondary | ICD-10-CM | POA: Diagnosis not present

## 2022-06-25 DIAGNOSIS — I10 Essential (primary) hypertension: Secondary | ICD-10-CM | POA: Diagnosis not present

## 2022-06-25 DIAGNOSIS — I69391 Dysphagia following cerebral infarction: Secondary | ICD-10-CM | POA: Diagnosis not present

## 2022-06-25 DIAGNOSIS — M5030 Other cervical disc degeneration, unspecified cervical region: Secondary | ICD-10-CM | POA: Diagnosis not present

## 2022-06-25 DIAGNOSIS — I69392 Facial weakness following cerebral infarction: Secondary | ICD-10-CM | POA: Diagnosis not present

## 2022-06-25 DIAGNOSIS — R7303 Prediabetes: Secondary | ICD-10-CM | POA: Diagnosis not present

## 2022-06-25 DIAGNOSIS — Z7902 Long term (current) use of antithrombotics/antiplatelets: Secondary | ICD-10-CM | POA: Diagnosis not present

## 2022-06-25 DIAGNOSIS — R1310 Dysphagia, unspecified: Secondary | ICD-10-CM | POA: Diagnosis not present

## 2022-06-25 DIAGNOSIS — E785 Hyperlipidemia, unspecified: Secondary | ICD-10-CM | POA: Diagnosis not present

## 2022-06-25 DIAGNOSIS — M5412 Radiculopathy, cervical region: Secondary | ICD-10-CM | POA: Diagnosis not present

## 2022-06-25 DIAGNOSIS — I351 Nonrheumatic aortic (valve) insufficiency: Secondary | ICD-10-CM | POA: Diagnosis not present

## 2022-06-25 DIAGNOSIS — E43 Unspecified severe protein-calorie malnutrition: Secondary | ICD-10-CM | POA: Diagnosis not present

## 2022-06-25 DIAGNOSIS — I700 Atherosclerosis of aorta: Secondary | ICD-10-CM | POA: Diagnosis not present

## 2022-06-25 DIAGNOSIS — Z993 Dependence on wheelchair: Secondary | ICD-10-CM | POA: Diagnosis not present

## 2022-06-25 DIAGNOSIS — I69320 Aphasia following cerebral infarction: Secondary | ICD-10-CM | POA: Diagnosis not present

## 2022-06-25 DIAGNOSIS — Z981 Arthrodesis status: Secondary | ICD-10-CM | POA: Diagnosis not present

## 2022-06-25 DIAGNOSIS — Z9049 Acquired absence of other specified parts of digestive tract: Secondary | ICD-10-CM | POA: Diagnosis not present

## 2022-06-25 DIAGNOSIS — M1990 Unspecified osteoarthritis, unspecified site: Secondary | ICD-10-CM | POA: Diagnosis not present

## 2022-06-25 DIAGNOSIS — Z9181 History of falling: Secondary | ICD-10-CM | POA: Diagnosis not present

## 2022-06-25 DIAGNOSIS — U071 COVID-19: Secondary | ICD-10-CM | POA: Diagnosis not present

## 2022-06-25 DIAGNOSIS — Z7982 Long term (current) use of aspirin: Secondary | ICD-10-CM | POA: Diagnosis not present

## 2022-06-26 DIAGNOSIS — Z9049 Acquired absence of other specified parts of digestive tract: Secondary | ICD-10-CM | POA: Diagnosis not present

## 2022-06-26 DIAGNOSIS — E43 Unspecified severe protein-calorie malnutrition: Secondary | ICD-10-CM | POA: Diagnosis not present

## 2022-06-26 DIAGNOSIS — Z9181 History of falling: Secondary | ICD-10-CM | POA: Diagnosis not present

## 2022-06-26 DIAGNOSIS — E785 Hyperlipidemia, unspecified: Secondary | ICD-10-CM | POA: Diagnosis not present

## 2022-06-26 DIAGNOSIS — I69320 Aphasia following cerebral infarction: Secondary | ICD-10-CM | POA: Diagnosis not present

## 2022-06-26 DIAGNOSIS — H409 Unspecified glaucoma: Secondary | ICD-10-CM | POA: Diagnosis not present

## 2022-06-26 DIAGNOSIS — I69391 Dysphagia following cerebral infarction: Secondary | ICD-10-CM | POA: Diagnosis not present

## 2022-06-26 DIAGNOSIS — G8929 Other chronic pain: Secondary | ICD-10-CM | POA: Diagnosis not present

## 2022-06-26 DIAGNOSIS — Z993 Dependence on wheelchair: Secondary | ICD-10-CM | POA: Diagnosis not present

## 2022-06-26 DIAGNOSIS — M5030 Other cervical disc degeneration, unspecified cervical region: Secondary | ICD-10-CM | POA: Diagnosis not present

## 2022-06-26 DIAGNOSIS — M5412 Radiculopathy, cervical region: Secondary | ICD-10-CM | POA: Diagnosis not present

## 2022-06-26 DIAGNOSIS — Z7902 Long term (current) use of antithrombotics/antiplatelets: Secondary | ICD-10-CM | POA: Diagnosis not present

## 2022-06-26 DIAGNOSIS — U071 COVID-19: Secondary | ICD-10-CM | POA: Diagnosis not present

## 2022-06-26 DIAGNOSIS — R1310 Dysphagia, unspecified: Secondary | ICD-10-CM | POA: Diagnosis not present

## 2022-06-26 DIAGNOSIS — I10 Essential (primary) hypertension: Secondary | ICD-10-CM | POA: Diagnosis not present

## 2022-06-26 DIAGNOSIS — R7303 Prediabetes: Secondary | ICD-10-CM | POA: Diagnosis not present

## 2022-06-26 DIAGNOSIS — Z7982 Long term (current) use of aspirin: Secondary | ICD-10-CM | POA: Diagnosis not present

## 2022-06-26 DIAGNOSIS — I351 Nonrheumatic aortic (valve) insufficiency: Secondary | ICD-10-CM | POA: Diagnosis not present

## 2022-06-26 DIAGNOSIS — I69392 Facial weakness following cerebral infarction: Secondary | ICD-10-CM | POA: Diagnosis not present

## 2022-06-26 DIAGNOSIS — M1990 Unspecified osteoarthritis, unspecified site: Secondary | ICD-10-CM | POA: Diagnosis not present

## 2022-06-26 DIAGNOSIS — Z981 Arthrodesis status: Secondary | ICD-10-CM | POA: Diagnosis not present

## 2022-06-26 DIAGNOSIS — I700 Atherosclerosis of aorta: Secondary | ICD-10-CM | POA: Diagnosis not present

## 2022-06-26 DIAGNOSIS — I69351 Hemiplegia and hemiparesis following cerebral infarction affecting right dominant side: Secondary | ICD-10-CM | POA: Diagnosis not present

## 2022-06-27 ENCOUNTER — Telehealth: Payer: Self-pay | Admitting: Family Medicine

## 2022-06-27 NOTE — Telephone Encounter (Signed)
Returned call and spoke with son about dad.

## 2022-06-27 NOTE — Telephone Encounter (Signed)
Patients son called requesting to speak with PCP or nurse. Says pt is going through a lot right now with his wife passing and now he is with people who dont know anything about his health conditions or medicines that he needs to take and son is very worried.

## 2022-06-30 ENCOUNTER — Telehealth: Payer: Self-pay | Admitting: Family Medicine

## 2022-06-30 NOTE — Telephone Encounter (Signed)
Needs clarification on medications that patient is supposed to be taking

## 2022-06-30 NOTE — Telephone Encounter (Signed)
I advised Paul Bradshaw she isn't on DPR so I can't discuss any healthcare with her.

## 2022-07-02 ENCOUNTER — Ambulatory Visit: Payer: Medicare Other | Admitting: Family Medicine

## 2022-07-02 ENCOUNTER — Telehealth: Payer: Self-pay | Admitting: Family Medicine

## 2022-07-02 NOTE — Telephone Encounter (Signed)
Advised that we are unable to work in today.  Appointment time was changed tomorrow to 3:20 pm.

## 2022-07-02 NOTE — Telephone Encounter (Signed)
Pts step daughter called to make sure that pt still had an appt today. Confirmed with her that appt had to be rescheduled for tomorrow due to provider being out of office.  Wants to know if anyone can see him today because she may not be able to bring him tomorrow. Advised her that we only have televisit/video visits open for today. She wants pt to be seen in person.  She is aware that she is not on his HIPAA so not much can be discussed with her.  Can anyone work pt in today?

## 2022-07-03 ENCOUNTER — Telehealth: Payer: Self-pay | Admitting: Family Medicine

## 2022-07-03 ENCOUNTER — Ambulatory Visit: Payer: Medicare Other

## 2022-07-03 ENCOUNTER — Ambulatory Visit: Payer: Medicare Other | Admitting: Family Medicine

## 2022-07-03 NOTE — Telephone Encounter (Signed)
Patient's son is calling to discuss his father's care.  He is concerned for his dad's wellbeing.  He states that other people are trying to take advantage of him and are not helping the situation.  I advised son to seek legal help with this.

## 2022-07-03 NOTE — Telephone Encounter (Signed)
Returned call and answered all questions

## 2022-07-04 ENCOUNTER — Telehealth: Payer: Self-pay | Admitting: Family Medicine

## 2022-07-07 ENCOUNTER — Other Ambulatory Visit: Payer: Self-pay | Admitting: Family Medicine

## 2022-07-07 DIAGNOSIS — Z8673 Personal history of transient ischemic attack (TIA), and cerebral infarction without residual deficits: Secondary | ICD-10-CM

## 2022-07-08 ENCOUNTER — Encounter: Payer: Self-pay | Admitting: Family Medicine

## 2022-07-09 DIAGNOSIS — I700 Atherosclerosis of aorta: Secondary | ICD-10-CM | POA: Diagnosis not present

## 2022-07-09 DIAGNOSIS — G8929 Other chronic pain: Secondary | ICD-10-CM | POA: Diagnosis not present

## 2022-07-09 DIAGNOSIS — I69392 Facial weakness following cerebral infarction: Secondary | ICD-10-CM | POA: Diagnosis not present

## 2022-07-09 DIAGNOSIS — Z7982 Long term (current) use of aspirin: Secondary | ICD-10-CM | POA: Diagnosis not present

## 2022-07-09 DIAGNOSIS — Z9049 Acquired absence of other specified parts of digestive tract: Secondary | ICD-10-CM | POA: Diagnosis not present

## 2022-07-09 DIAGNOSIS — Z9181 History of falling: Secondary | ICD-10-CM | POA: Diagnosis not present

## 2022-07-09 DIAGNOSIS — I10 Essential (primary) hypertension: Secondary | ICD-10-CM | POA: Diagnosis not present

## 2022-07-09 DIAGNOSIS — E785 Hyperlipidemia, unspecified: Secondary | ICD-10-CM | POA: Diagnosis not present

## 2022-07-09 DIAGNOSIS — Z981 Arthrodesis status: Secondary | ICD-10-CM | POA: Diagnosis not present

## 2022-07-09 DIAGNOSIS — E43 Unspecified severe protein-calorie malnutrition: Secondary | ICD-10-CM | POA: Diagnosis not present

## 2022-07-09 DIAGNOSIS — M1990 Unspecified osteoarthritis, unspecified site: Secondary | ICD-10-CM | POA: Diagnosis not present

## 2022-07-09 DIAGNOSIS — I69391 Dysphagia following cerebral infarction: Secondary | ICD-10-CM | POA: Diagnosis not present

## 2022-07-09 DIAGNOSIS — I69320 Aphasia following cerebral infarction: Secondary | ICD-10-CM | POA: Diagnosis not present

## 2022-07-09 DIAGNOSIS — I69351 Hemiplegia and hemiparesis following cerebral infarction affecting right dominant side: Secondary | ICD-10-CM | POA: Diagnosis not present

## 2022-07-09 DIAGNOSIS — M5010 Cervical disc disorder with radiculopathy, unspecified cervical region: Secondary | ICD-10-CM | POA: Diagnosis not present

## 2022-07-09 DIAGNOSIS — I351 Nonrheumatic aortic (valve) insufficiency: Secondary | ICD-10-CM | POA: Diagnosis not present

## 2022-07-09 DIAGNOSIS — H409 Unspecified glaucoma: Secondary | ICD-10-CM | POA: Diagnosis not present

## 2022-07-09 DIAGNOSIS — R7303 Prediabetes: Secondary | ICD-10-CM | POA: Diagnosis not present

## 2022-07-09 DIAGNOSIS — Z8616 Personal history of COVID-19: Secondary | ICD-10-CM | POA: Diagnosis not present

## 2022-07-09 DIAGNOSIS — Z7902 Long term (current) use of antithrombotics/antiplatelets: Secondary | ICD-10-CM | POA: Diagnosis not present

## 2022-07-10 ENCOUNTER — Telehealth: Payer: Self-pay | Admitting: Family Medicine

## 2022-07-10 DIAGNOSIS — I63512 Cerebral infarction due to unspecified occlusion or stenosis of left middle cerebral artery: Secondary | ICD-10-CM | POA: Diagnosis not present

## 2022-07-10 NOTE — Telephone Encounter (Signed)
Mallory called from [place] wanting verbal orders to recertify for OT with patient next week since there were unsuccessful visits this week.  Explained to Mims that normally we could give verbal ok but with this patient, we have received a record request from [facility] in [facility] because pt will no longer be a patient here. He is establishing care with them. Mallory voiced understanding. Wants to know if Sharyn Lull is still able to order this for him or no?

## 2022-07-10 NOTE — Telephone Encounter (Signed)
A family member called to see if pt could be seen tomorrow. Tried asking if pt was still a pt with [place] because we received a record request and have sent his records to another office. The lady got very upset and stated that we were not supposed to send any of his records per [facility]. This lady was very mad and is calling back tomorrow to speak with office manager.  There is no documentation in patients chart stating that we can't send his records. We received a records request with patients signature so how would we know if the patient actually signed the release form or not unless we were at [facility] with him when he signed it? Pt does not have a POA on file so no one is in charge of him until documentation is legally provided.

## 2022-07-11 ENCOUNTER — Ambulatory Visit (INDEPENDENT_AMBULATORY_CARE_PROVIDER_SITE_OTHER): Payer: Medicare Other | Admitting: Family

## 2022-07-11 ENCOUNTER — Encounter: Payer: Self-pay | Admitting: Family

## 2022-07-11 VITALS — BP 105/41 | HR 57 | Temp 97.8°F | Ht 68.0 in | Wt 142.0 lb

## 2022-07-11 DIAGNOSIS — I63512 Cerebral infarction due to unspecified occlusion or stenosis of left middle cerebral artery: Secondary | ICD-10-CM | POA: Diagnosis not present

## 2022-07-11 DIAGNOSIS — J069 Acute upper respiratory infection, unspecified: Secondary | ICD-10-CM | POA: Diagnosis not present

## 2022-07-11 DIAGNOSIS — G8929 Other chronic pain: Secondary | ICD-10-CM | POA: Diagnosis not present

## 2022-07-11 DIAGNOSIS — M25512 Pain in left shoulder: Secondary | ICD-10-CM

## 2022-07-11 DIAGNOSIS — J029 Acute pharyngitis, unspecified: Secondary | ICD-10-CM | POA: Diagnosis not present

## 2022-07-11 LAB — CULTURE, GROUP A STREP

## 2022-07-11 LAB — RAPID STREP SCREEN (MED CTR MEBANE ONLY): Strep Gp A Ag, IA W/Reflex: NEGATIVE

## 2022-07-11 MED ORDER — CETIRIZINE HCL 5 MG PO TABS: 5.0000 mg | ORAL_TABLET | Freq: Every day | ORAL | 1 refills | Status: DC

## 2022-07-11 MED ORDER — FLUTICASONE PROPIONATE 50 MCG/ACT NA SUSP: 2.0000 | Freq: Every day | NASAL | 6 refills | Status: DC

## 2022-07-11 NOTE — Telephone Encounter (Signed)
TC w/ Malori w/ [place] She is not able to see pt this w/ needing VO to do recert next week, gave VO for next week, then PCP would like fax to sign for further orders d/t pt signing records release to transfer care to [facility] in [facility].

## 2022-07-11 NOTE — Telephone Encounter (Signed)
I spoke with patient's son and this patient is not competent enough to sign a release of records.  Patient's son, Arkin "Ronalee Belts" Mcphearson says patient is not going to switch to [place] and please rescend the request.  I will contact [employer] and make them aware of this situation and have them to shred the records.

## 2022-07-11 NOTE — Patient Instructions (Signed)

## 2022-07-11 NOTE — Progress Notes (Signed)
Subjective:    Patient ID: Paul Bradshaw, male    DOB: 05-Mar-1926, 89 y.o.   MRN: [id number]  Chief Complaint  Patient presents with   Cough   Sore Throat    Cough This is a new problem. Pertinent negatives include no ear pain, headaches or shortness of breath.  Sore Throat  This is a new problem. The current episode started yesterday. The problem has been unchanged. There has been no fever. The pain is mild. Associated symptoms include coughing. Pertinent negatives include no ear pain, headaches, shortness of breath or trouble swallowing. He has tried NSAIDs for the symptoms. The treatment provided mild relief.  Shoulder Pain  This is a new problem. The current episode started more than 1 month ago. History of extremity trauma: hx of CVA. The pain is moderate. Associated symptoms include a limited range of motion, stiffness and tingling. He has tried acetaminophen for the symptoms. The treatment provided mild relief.      Review of Systems  HENT:  Negative for ear pain and trouble swallowing.   Respiratory:  Positive for cough. Negative for shortness of breath.   Musculoskeletal:  Positive for stiffness.  Neurological:  Positive for tingling. Negative for headaches.  All other systems reviewed and are negative.      Objective:   Physical Exam Vitals reviewed.  Constitutional:      General: He is not in acute distress.    Appearance: He is well-developed.  HENT:     Head: Normocephalic.     Mouth/Throat:     Pharynx: Posterior oropharyngeal erythema present.  Eyes:     General:        Right eye: No discharge.        Left eye: No discharge.     Pupils: Pupils are equal, round, and reactive to light.  Neck:     Thyroid: No thyromegaly.  Cardiovascular:     Rate and Rhythm: Normal rate and regular rhythm.     Heart sounds: Normal heart sounds. No murmur heard. Pulmonary:     Effort: Pulmonary effort is normal. No respiratory distress.     Breath sounds: Normal breath  sounds. No wheezing.  Abdominal:     General: Bowel sounds are normal. There is no distension.     Palpations: Abdomen is soft.     Tenderness: There is no abdominal tenderness.  Musculoskeletal:        General: No tenderness.     Cervical back: Normal range of motion and neck supple.  Skin:    General: Skin is warm and dry.     Findings: No erythema or rash.  Neurological:     Mental Status: He is alert and oriented to person, place, and time.     Cranial Nerves: No cranial nerve deficit.     Deep Tendon Reflexes: Reflexes are normal and symmetric.  Psychiatric:        Behavior: Behavior normal.        Thought Content: Thought content normal.        Judgment: Judgment normal.       BP (!) 105/41   Pulse (!) 57   Temp 97.8 F (36.6 C) (Temporal)   Ht 5\' 8"  (1.727 m)   Wt 142 lb (64.4 kg)   SpO2 100%   BMI 21.59 kg/m      Assessment & Plan:  Paul Bradshaw comes in today with chief complaint of Cough and Sore Throat   Diagnosis and  orders addressed:  1. Sore throat - Rapid Strep Screen ([facility] ONLY)  2. Viral URI - Take meds as prescribed - Use a cool mist humidifier  -Use saline nose sprays frequently -Force fluids -For any cough or congestion  Use plain Mucinex- regular strength or max strength is fine -For fever or aces or pains- take tylenol or ibuprofen. -Throat lozenges if help -Follow up if symptoms worsen or do not improve  - cetirizine (ZYRTEC) 5 MG tablet; Take 1 tablet (5 mg total) by mouth daily.  Dispense: 90 tablet; Refill: 1 - fluticasone (FLONASE) 50 MCG/ACT nasal spray; Place 2 sprays into both nostrils daily.  Dispense: 16 g; Refill: 6  3. Chronic left shoulder pain Continue tylenol  Continue PT   Evelina Dun, FNP

## 2022-07-14 ENCOUNTER — Telehealth: Payer: Self-pay | Admitting: Family Medicine

## 2022-07-14 DIAGNOSIS — I69392 Facial weakness following cerebral infarction: Secondary | ICD-10-CM | POA: Diagnosis not present

## 2022-07-14 DIAGNOSIS — M5010 Cervical disc disorder with radiculopathy, unspecified cervical region: Secondary | ICD-10-CM | POA: Diagnosis not present

## 2022-07-14 DIAGNOSIS — I10 Essential (primary) hypertension: Secondary | ICD-10-CM | POA: Diagnosis not present

## 2022-07-14 DIAGNOSIS — G8929 Other chronic pain: Secondary | ICD-10-CM | POA: Diagnosis not present

## 2022-07-14 DIAGNOSIS — E43 Unspecified severe protein-calorie malnutrition: Secondary | ICD-10-CM | POA: Diagnosis not present

## 2022-07-14 DIAGNOSIS — I69351 Hemiplegia and hemiparesis following cerebral infarction affecting right dominant side: Secondary | ICD-10-CM | POA: Diagnosis not present

## 2022-07-14 DIAGNOSIS — I700 Atherosclerosis of aorta: Secondary | ICD-10-CM | POA: Diagnosis not present

## 2022-07-14 DIAGNOSIS — Z8616 Personal history of COVID-19: Secondary | ICD-10-CM | POA: Diagnosis not present

## 2022-07-14 DIAGNOSIS — I69391 Dysphagia following cerebral infarction: Secondary | ICD-10-CM | POA: Diagnosis not present

## 2022-07-14 DIAGNOSIS — M1990 Unspecified osteoarthritis, unspecified site: Secondary | ICD-10-CM | POA: Diagnosis not present

## 2022-07-14 DIAGNOSIS — I351 Nonrheumatic aortic (valve) insufficiency: Secondary | ICD-10-CM | POA: Diagnosis not present

## 2022-07-14 DIAGNOSIS — Z9049 Acquired absence of other specified parts of digestive tract: Secondary | ICD-10-CM | POA: Diagnosis not present

## 2022-07-14 DIAGNOSIS — R7303 Prediabetes: Secondary | ICD-10-CM | POA: Diagnosis not present

## 2022-07-14 DIAGNOSIS — E785 Hyperlipidemia, unspecified: Secondary | ICD-10-CM | POA: Diagnosis not present

## 2022-07-14 DIAGNOSIS — H409 Unspecified glaucoma: Secondary | ICD-10-CM | POA: Diagnosis not present

## 2022-07-14 DIAGNOSIS — Z7982 Long term (current) use of aspirin: Secondary | ICD-10-CM | POA: Diagnosis not present

## 2022-07-14 DIAGNOSIS — Z9181 History of falling: Secondary | ICD-10-CM | POA: Diagnosis not present

## 2022-07-14 DIAGNOSIS — I69320 Aphasia following cerebral infarction: Secondary | ICD-10-CM | POA: Diagnosis not present

## 2022-07-14 DIAGNOSIS — Z7902 Long term (current) use of antithrombotics/antiplatelets: Secondary | ICD-10-CM | POA: Diagnosis not present

## 2022-07-14 DIAGNOSIS — Z981 Arthrodesis status: Secondary | ICD-10-CM | POA: Diagnosis not present

## 2022-07-14 NOTE — Telephone Encounter (Signed)
TC w/ Paul Bradshaw PT w/ [facility], she is on her way to see pt now Calling to see if any changes w/ pt after visit on Friday Pt was seen for URI and shoulder pain, documented to continue Tylenol & PT This was helpful since the family dynamics is complicated and there is difficulty in getting accurate information.

## 2022-07-15 ENCOUNTER — Other Ambulatory Visit: Payer: Self-pay | Admitting: Family Medicine

## 2022-07-15 NOTE — Telephone Encounter (Signed)
pharmacy: PT NOW TAKING 1 TABLET 2 TIMES DAILY PER YOUR OFFICE NEED NEW RX THANKS  Please advise

## 2022-07-15 NOTE — Telephone Encounter (Signed)
Please advise 

## 2022-07-17 ENCOUNTER — Encounter: Payer: Self-pay | Admitting: Family Medicine

## 2022-07-17 ENCOUNTER — Ambulatory Visit (INDEPENDENT_AMBULATORY_CARE_PROVIDER_SITE_OTHER): Payer: Medicare Other | Admitting: Family Medicine

## 2022-07-17 VITALS — BP 122/64 | HR 73 | Temp 97.9°F

## 2022-07-17 DIAGNOSIS — I10 Essential (primary) hypertension: Secondary | ICD-10-CM | POA: Diagnosis not present

## 2022-07-17 DIAGNOSIS — Z8673 Personal history of transient ischemic attack (TIA), and cerebral infarction without residual deficits: Secondary | ICD-10-CM | POA: Diagnosis not present

## 2022-07-17 DIAGNOSIS — F99 Mental disorder, not otherwise specified: Secondary | ICD-10-CM | POA: Diagnosis not present

## 2022-07-17 DIAGNOSIS — E782 Mixed hyperlipidemia: Secondary | ICD-10-CM | POA: Diagnosis not present

## 2022-07-17 DIAGNOSIS — Z87898 Personal history of other specified conditions: Secondary | ICD-10-CM | POA: Diagnosis not present

## 2022-07-17 DIAGNOSIS — R739 Hyperglycemia, unspecified: Secondary | ICD-10-CM | POA: Diagnosis not present

## 2022-07-17 DIAGNOSIS — I69320 Aphasia following cerebral infarction: Secondary | ICD-10-CM

## 2022-07-17 LAB — BAYER DCA HB A1C WAIVED: HB A1C (BAYER DCA - WAIVED): 5.9 % — ABNORMAL HIGH (ref 4.8–5.6)

## 2022-07-17 NOTE — Progress Notes (Signed)
Subjective:  Patient ID: Paul Bradshaw, male    DOB: Sep 14, 1925, 89 y.o.   MRN: [id number]  Patient Care Team: Baruch Gouty, FNP as PCP - General (Family Medicine)   Chief Complaint:  MMSE   HPI: Paul Bradshaw is a 89 y.o. male presenting on 07/17/2022 for MMSE   Pt presents today with his paternal granddaughter, Paul Bradshaw, and DSS/APS for a MMSE exam to determine competency. Pt is a 89 year old caucasian male who was healthy until he had a stroke 01/2022. He was completely independent until this time. He now has significant residual effects from the stroke including dysphagia aphasia, and right sided weakness. He has been living with he son and his son's wife since the stroke. There has been a concern about POA rights and this brought DSS into the picture. During previous visits, I have spoke with Ronalee Belts and [place]. When asked if pt is doing ok today, he will nod his head yes and make a sound. When asked if he likes living with Ronalee Belts, he will nod his head yes. When ask if he is hurting, he points to his left knee. When ask if he is ok, he will nod head yes. When ask if he needs anything, he nods head no.  Unable to due MMSE as pt can not communicate verbally or written.       Relevant past medical, surgical, family, and social history reviewed and updated as indicated.  Allergies and medications reviewed and updated. Data reviewed: Chart in Epic.   Past Medical History:  Diagnosis Date   Aortic regurgitation    Moderate   Arthritis    BPH (benign prostatic hyperplasia)    Cervical disc disease    Cervical radiculopathy    Hyperlipidemia    Hypertension    Prediabetes 02/16/2021   Staphylococcus aureus bacteremia 08/09/2012   TEE negative for vegetation or thrombus February 2014   Stroke [facility]) 2005 or 2006   3   Symptomatic carotid artery stenosis with infarction [facility]) 2005 or 2006   Status post right carotid endarterectomy    Past Surgical History:  Procedure  Laterality Date   BACK SURGERY     CAROTID ENDARTERECTOMY     CATARACT EXTRACTION W/PHACO Left 02/15/2015   Procedure: CATARACT EXTRACTION PHACO AND INTRAOCULAR LENS PLACEMENT LEFT EYE CDE=30.93;  Surgeon: Tonny Branch, MD;  Location: [facility] ORS;  Service: Ophthalmology;  Laterality: Left;   CHOLECYSTECTOMY     EYE SURGERY     KIDNEY STONE SURGERY     LUMBAR LAMINECTOMY/DECOMPRESSION MICRODISCECTOMY Bilateral 01/23/2014   Procedure: LUMBAR LAMINECTOMY/DECOMPRESSION MICRODISCECTOMY 1 LEVEL L5-S1;  Surgeon: Charlie Pitter, MD;  Location: [place] NEURO ORS;  Service: Neurosurgery;  Laterality: Bilateral;  LUMBAR LAMINECTOMY/DECOMPRESSION MICRODISCECTOMY 1 LEVEL L5-S1   TEE WITHOUT CARDIOVERSION N/A 08/12/2012   Procedure: TRANSESOPHAGEAL ECHOCARDIOGRAM (TEE);  Surgeon: Josue Hector, MD;  Location: [facility] END[facility] SU[facility];  Service: Cardiovascular;  Laterality: N/A;   TEE WITHOUT CARDIOVERSION N/A 06/24/2013   Procedure: TRANSESOPHAGEAL ECHOCARDIOGRAM (TEE);  Surgeon: Satira Sark, MD;  Location: [facility] ENDO SUITE;  Service: Endoscopy;  Laterality: N/A;    Social History   Socioeconomic History   Marital status: Married    Spouse name: Not on file   Number of children: Not on file   Years of education: Not on file   Highest education level: Not on file  Occupational History   Not on file  Tobacco Use   Smoking status: Never   Smokeless  tobacco: Never  Vaping Use   Vaping Use: Never used  Substance and Sexual Activity   Alcohol use: No   Drug use: No   Sexual activity: Not Currently  Other Topics Concern   Not on file  Social History Narrative   Not on file   Social Determinants of Health   Financial Resource Strain: Not on file  Food Insecurity: No Food Insecurity (05/10/2022)   Hunger Vital Sign    Worried About Running Out of Food in the Last Year: Never true    Ran Out of Food in the Last Year: Never true  Transportation Needs: No Transportation Needs (05/10/2022)   PRAPARE -  Administrator, Civil Service (Medical): No    Lack of Transportation (Non-Medical): No  Physical Activity: Not on file  Stress: Not on file  Social Connections: Not on file  Intimate Partner Violence: Not At Risk (05/10/2022)   Humiliation, Afraid, Rape, and Kick questionnaire    Fear of Current or Ex-Partner: No    Emotionally Abused: No    Physically Abused: No    Sexually Abused: No    Outpatient Encounter Medications as of 07/17/2022  Medication Sig   cetirizine (ZYRTEC) 5 MG tablet Take 1 tablet (5 mg total) by mouth daily.   clopidogrel (PLAVIX) 75 MG tablet TAKE ONE TABLET EVERY DAY WITH BREAKFAST   famotidine (PEPCID) 20 MG tablet TAKE ONE TABLET TWICE DAILY   metoprolol tartrate (LOPRESSOR) 25 MG tablet TAKE 1/2 TABLET TWICE DAILY   [DISCONTINUED] albuterol (VENTOLIN HFA) 108 (90 Base) MCG/ACT inhaler Inhale 2 puffs into the lungs every 6 (six) hours as needed for shortness of breath or wheezing. (Patient not taking: Reported on 07/11/2022)   [DISCONTINUED] fluticasone (FLONASE) 50 MCG/ACT nasal spray Place 2 sprays into both nostrils daily. (Patient not taking: Reported on 07/17/2022)   No facility-administered encounter medications on file as of 07/17/2022.    Allergies  Allergen Reactions   Amlodipine Swelling    Swelling of lips.   Lisinopril Other (See Comments)    Elevated BP higher & causes a burning feeling on the inside.     Review of Systems  Unable to perform ROS: Patient nonverbal        Objective:  BP 122/64   Pulse 73   Temp 97.9 F (36.6 C) (Temporal)   SpO2 98%    Wt Readings from Last 3 Encounters:  07/11/22 142 lb (64.4 kg)  05/10/22 145 lb (65.8 kg)  03/03/22 142 lb 3.2 oz (64.5 kg)    Physical Exam Vitals and nursing note reviewed.  Constitutional:      General: He is not in acute distress.    Appearance: He is ill-appearing (chronically ill). He is not toxic-appearing or diaphoretic.  HENT:     Head: Normocephalic and  atraumatic.     Mouth/Throat:     Mouth: Mucous membranes are moist.  Eyes:     Conjunctiva/sclera: Conjunctivae normal.     Pupils: Pupils are equal, round, and reactive to light.  Cardiovascular:     Rate and Rhythm: Normal rate and regular rhythm.  Pulmonary:     Effort: Pulmonary effort is normal.     Breath sounds: Normal breath sounds.  Abdominal:     General: Bowel sounds are normal.     Palpations: Abdomen is soft.  Musculoskeletal:     Right lower leg: No edema.     Left lower leg: No edema.  Skin:    General:  Skin is warm and dry.     Capillary Refill: Capillary refill takes less than 2 seconds.  Neurological:     Mental Status: He is alert.     Motor: Weakness (RUE, RLE) present.     Gait: Gait abnormal (in wheelchair).  Psychiatric:        Attention and Perception: Attention normal.        Behavior: Behavior is cooperative.     Results for orders placed or performed in visit on 07/17/22  Bayer DCA Hb A1c Waived  Result Value Ref Range   HB A1C (BAYER DCA - WAIVED) 5.9 (H) 4.8 - 5.6 %       Pertinent labs & imaging results that were available during my care of the patient were reviewed by me and considered in my medical decision making.  Assessment & Plan:  Khoi was seen today for mmse.  Diagnoses and all orders for this visit:  Abnormal MMSE Pt has aphasia and RUE weakness from CVA. He was unable to complete MMSE as he can not communicate verbally and he can not write. Will answer some questions appropriately with nodding head yes and not.   History of CVA (cerebrovascular accident) Aphasia due to recent cerebral infarction Has HH, PT and OT in the home. Will check labs today. No changes to medications.  -     CMP14+EGFR -     CBC with Differential/Platelet -     Thyroid Panel With TSH -     Lipid panel -     Bayer DCA Hb A1c Waived  Mixed hyperlipidemia Labs pending.  -     CMP14+EGFR -     CBC with Differential/Platelet -     Thyroid Panel  With TSH -     Lipid panel -     Bayer DCA Hb A1c Waived  Essential hypertension BP well controlled. Changes were not made in regimen today. Goal BP is 130/80. Pt aware to report any persistent high or low readings. DASH diet and exercise encouraged. Exercise at least 150 minutes per week and increase as tolerated. Goal BMI > 25. Stress management encouraged. Avoid nicotine and tobacco product use. Avoid excessive alcohol and NSAID's. Avoid more than 2000 mg of sodium daily. Medications as prescribed. Follow up as scheduled.  -     CMP14+EGFR -     CBC with Differential/Platelet -     Thyroid Panel With TSH -     Lipid panel -     Bayer DCA Hb A1c Waived  History of prediabetes Labs pending. Diet and exercise encouraged.  -     CMP14+EGFR -     CBC with Differential/Platelet -     Thyroid Panel With TSH -     Lipid panel -     Bayer DCA Hb A1c Waived     Continue all other maintenance medications.  Follow up plan: Return in about 6 months (around 01/15/2023), or if symptoms worsen or fail to improve.   Continue healthy lifestyle choices, including diet (rich in fruits, vegetables, and lean proteins, and low in salt and simple carbohydrates) and exercise (at least 30 minutes of moderate physical activity daily).  The above assessment and management plan was discussed with the patient. The patient verbalized understanding of and has agreed to the management plan. Patient is aware to call the clinic if they develop any new symptoms or if symptoms persist or worsen. Patient is aware when to return to the clinic for a  follow-up visit. Patient educated on when it is appropriate to go to the emergency department.   Monia Pouch, FNP-C [facility] [phone]

## 2022-07-18 ENCOUNTER — Telehealth: Payer: Self-pay | Admitting: *Deleted

## 2022-07-18 DIAGNOSIS — I700 Atherosclerosis of aorta: Secondary | ICD-10-CM | POA: Diagnosis not present

## 2022-07-18 DIAGNOSIS — I69391 Dysphagia following cerebral infarction: Secondary | ICD-10-CM | POA: Diagnosis not present

## 2022-07-18 DIAGNOSIS — M1990 Unspecified osteoarthritis, unspecified site: Secondary | ICD-10-CM | POA: Diagnosis not present

## 2022-07-18 DIAGNOSIS — R7303 Prediabetes: Secondary | ICD-10-CM | POA: Diagnosis not present

## 2022-07-18 DIAGNOSIS — I69392 Facial weakness following cerebral infarction: Secondary | ICD-10-CM | POA: Diagnosis not present

## 2022-07-18 DIAGNOSIS — Z9049 Acquired absence of other specified parts of digestive tract: Secondary | ICD-10-CM | POA: Diagnosis not present

## 2022-07-18 DIAGNOSIS — Z981 Arthrodesis status: Secondary | ICD-10-CM | POA: Diagnosis not present

## 2022-07-18 DIAGNOSIS — I351 Nonrheumatic aortic (valve) insufficiency: Secondary | ICD-10-CM | POA: Diagnosis not present

## 2022-07-18 DIAGNOSIS — Z7982 Long term (current) use of aspirin: Secondary | ICD-10-CM | POA: Diagnosis not present

## 2022-07-18 DIAGNOSIS — Z9181 History of falling: Secondary | ICD-10-CM | POA: Diagnosis not present

## 2022-07-18 DIAGNOSIS — I69320 Aphasia following cerebral infarction: Secondary | ICD-10-CM | POA: Diagnosis not present

## 2022-07-18 DIAGNOSIS — G8929 Other chronic pain: Secondary | ICD-10-CM | POA: Diagnosis not present

## 2022-07-18 DIAGNOSIS — I10 Essential (primary) hypertension: Secondary | ICD-10-CM | POA: Diagnosis not present

## 2022-07-18 DIAGNOSIS — H409 Unspecified glaucoma: Secondary | ICD-10-CM | POA: Diagnosis not present

## 2022-07-18 DIAGNOSIS — Z8616 Personal history of COVID-19: Secondary | ICD-10-CM | POA: Diagnosis not present

## 2022-07-18 DIAGNOSIS — I69351 Hemiplegia and hemiparesis following cerebral infarction affecting right dominant side: Secondary | ICD-10-CM | POA: Diagnosis not present

## 2022-07-18 DIAGNOSIS — E785 Hyperlipidemia, unspecified: Secondary | ICD-10-CM | POA: Diagnosis not present

## 2022-07-18 DIAGNOSIS — E43 Unspecified severe protein-calorie malnutrition: Secondary | ICD-10-CM | POA: Diagnosis not present

## 2022-07-18 DIAGNOSIS — Z7902 Long term (current) use of antithrombotics/antiplatelets: Secondary | ICD-10-CM | POA: Diagnosis not present

## 2022-07-18 DIAGNOSIS — M5010 Cervical disc disorder with radiculopathy, unspecified cervical region: Secondary | ICD-10-CM | POA: Diagnosis not present

## 2022-07-18 LAB — CMP14+EGFR
ALT: 8 IU/L (ref 0–44)
AST: 15 IU/L (ref 0–40)
Albumin/Globulin Ratio: 1.8 (ref 1.2–2.2)
Albumin: 3.7 g/dL (ref 3.6–4.6)
Alkaline Phosphatase: 92 IU/L (ref 44–121)
BUN/Creatinine Ratio: 13 (ref 10–24)
BUN: 16 mg/dL (ref 10–36)
Bilirubin Total: 0.4 mg/dL (ref 0.0–1.2)
CO2: 23 mmol/L (ref 20–29)
Calcium: 9 mg/dL (ref 8.6–10.2)
Chloride: 101 mmol/L (ref 96–106)
Creatinine, Ser: 1.2 mg/dL (ref 0.76–1.27)
Globulin, Total: 2.1 g/dL (ref 1.5–4.5)
Glucose: 112 mg/dL — ABNORMAL HIGH (ref 70–99)
Potassium: 4.3 mmol/L (ref 3.5–5.2)
Sodium: 140 mmol/L (ref 134–144)
Total Protein: 5.8 g/dL — ABNORMAL LOW (ref 6.0–8.5)
eGFR: 56 mL/min/{1.73_m2} — ABNORMAL LOW (ref >59)

## 2022-07-18 LAB — LIPID PANEL
Chol/HDL Ratio: 2.9 ratio (ref 0.0–5.0)
Cholesterol, Total: 158 mg/dL (ref 100–199)
HDL: 55 mg/dL (ref >39)
LDL Chol Calc (NIH): 79 mg/dL (ref 0–99)
Triglycerides: 135 mg/dL (ref 0–149)
VLDL Cholesterol Cal: 24 mg/dL (ref 5–40)

## 2022-07-18 LAB — THYROID PANEL WITH TSH
Free Thyroxine Index: 1.9 (ref 1.2–4.9)
T3 Uptake Ratio: 30 % (ref 24–39)
T4, Total: 6.3 ug/dL (ref 4.5–12.0)
TSH: 2.71 u[IU]/mL (ref 0.450–4.500)

## 2022-07-18 LAB — CBC WITH DIFFERENTIAL/PLATELET
Basophils Absolute: 0 10*3/uL (ref 0.0–0.2)
Basos: 0 %
EOS (ABSOLUTE): 0.2 10*3/uL (ref 0.0–0.4)
Eos: 2 %
Hematocrit: 38.1 % (ref 37.5–51.0)
Hemoglobin: 12.5 g/dL — ABNORMAL LOW (ref 13.0–17.7)
Immature Grans (Abs): 0 10*3/uL (ref 0.0–0.1)
Immature Granulocytes: 1 %
Lymphocytes Absolute: 1.3 10*3/uL (ref 0.7–3.1)
Lymphs: 18 %
MCH: 30.9 pg (ref 26.6–33.0)
MCHC: 32.8 g/dL (ref 31.5–35.7)
MCV: 94 fL (ref 79–97)
Monocytes Absolute: 0.6 10*3/uL (ref 0.1–0.9)
Monocytes: 8 %
Neutrophils Absolute: 5.3 10*3/uL (ref 1.4–7.0)
Neutrophils: 71 %
Platelets: 249 10*3/uL (ref 150–450)
RBC: 4.05 x10E6/uL — ABNORMAL LOW (ref 4.14–5.80)
RDW: 13.7 % (ref 11.6–15.4)
WBC: 7.5 10*3/uL (ref 3.4–10.8)

## 2022-07-18 NOTE — Telephone Encounter (Signed)
TC from [place] Pt is going on Pill packaging Metoprolol 25 mg refill on 07/15/22 had the following question to it: pharmacy: PT NOW TAKING 1 TABLET 2 TIMES DAILY PER YOUR OFFICE NEED NEW RX THANKS  In looking at chart the Metoprolol looks to have been changed by Dr. Denton Brick back 05/12/22 Also wanting to know about Atorvastatin, this is not on his med list, family reported at 07/11/22 visit that he was not taking  Please advise on these 2 medications. He Pill packaging is scheduled to go out on Wed 07/23/22

## 2022-07-19 ENCOUNTER — Encounter (HOSPITAL_COMMUNITY): Payer: Self-pay

## 2022-07-19 ENCOUNTER — Emergency Department (HOSPITAL_COMMUNITY): Payer: Medicare Other

## 2022-07-19 ENCOUNTER — Inpatient Hospital Stay (HOSPITAL_COMMUNITY)
Admission: EM | Admit: 2022-07-19 | Discharge: 2022-07-21 | DRG: 194 | Disposition: A | Discharge status: Home Health | Payer: Medicare Other | Attending: Internal Medicine | Admitting: Internal Medicine

## 2022-07-19 ENCOUNTER — Other Ambulatory Visit: Payer: Self-pay

## 2022-07-19 DIAGNOSIS — R100 Acute abdomen: Secondary | ICD-10-CM | POA: Diagnosis not present

## 2022-07-19 DIAGNOSIS — E876 Hypokalemia: Secondary | ICD-10-CM | POA: Diagnosis not present

## 2022-07-19 DIAGNOSIS — Z8249 Family history of ischemic heart disease and other diseases of the circulatory system: Secondary | ICD-10-CM | POA: Diagnosis not present

## 2022-07-19 DIAGNOSIS — I959 Hypotension, unspecified: Secondary | ICD-10-CM | POA: Diagnosis not present

## 2022-07-19 DIAGNOSIS — Z79899 Other long term (current) drug therapy: Secondary | ICD-10-CM

## 2022-07-19 DIAGNOSIS — Z825 Family history of asthma and other chronic lower respiratory diseases: Secondary | ICD-10-CM | POA: Diagnosis not present

## 2022-07-19 DIAGNOSIS — I351 Nonrheumatic aortic (valve) insufficiency: Secondary | ICD-10-CM | POA: Diagnosis not present

## 2022-07-19 DIAGNOSIS — Z743 Need for continuous supervision: Secondary | ICD-10-CM | POA: Diagnosis not present

## 2022-07-19 DIAGNOSIS — E785 Hyperlipidemia, unspecified: Secondary | ICD-10-CM | POA: Diagnosis not present

## 2022-07-19 DIAGNOSIS — R6520 Severe sepsis without septic shock: Secondary | ICD-10-CM

## 2022-07-19 DIAGNOSIS — R918 Other nonspecific abnormal finding of lung field: Secondary | ICD-10-CM | POA: Diagnosis not present

## 2022-07-19 DIAGNOSIS — Z9049 Acquired absence of other specified parts of digestive tract: Secondary | ICD-10-CM | POA: Diagnosis not present

## 2022-07-19 DIAGNOSIS — J90 Pleural effusion, not elsewhere classified: Secondary | ICD-10-CM | POA: Diagnosis not present

## 2022-07-19 DIAGNOSIS — I69351 Hemiplegia and hemiparesis following cerebral infarction affecting right dominant side: Secondary | ICD-10-CM | POA: Diagnosis not present

## 2022-07-19 DIAGNOSIS — A419 Sepsis, unspecified organism: Secondary | ICD-10-CM | POA: Diagnosis not present

## 2022-07-19 DIAGNOSIS — Z820 Family history of epilepsy and other diseases of the nervous system: Secondary | ICD-10-CM | POA: Diagnosis not present

## 2022-07-19 DIAGNOSIS — Z1152 Encounter for screening for COVID-19: Secondary | ICD-10-CM | POA: Diagnosis not present

## 2022-07-19 DIAGNOSIS — R509 Fever, unspecified: Secondary | ICD-10-CM | POA: Diagnosis not present

## 2022-07-19 DIAGNOSIS — R531 Weakness: Secondary | ICD-10-CM | POA: Diagnosis not present

## 2022-07-19 DIAGNOSIS — Z66 Do not resuscitate: Secondary | ICD-10-CM | POA: Diagnosis not present

## 2022-07-19 DIAGNOSIS — Z7902 Long term (current) use of antithrombotics/antiplatelets: Secondary | ICD-10-CM | POA: Diagnosis not present

## 2022-07-19 DIAGNOSIS — R7303 Prediabetes: Secondary | ICD-10-CM | POA: Diagnosis not present

## 2022-07-19 DIAGNOSIS — R059 Cough, unspecified: Secondary | ICD-10-CM | POA: Diagnosis not present

## 2022-07-19 DIAGNOSIS — I69320 Aphasia following cerebral infarction: Secondary | ICD-10-CM | POA: Diagnosis not present

## 2022-07-19 DIAGNOSIS — R404 Transient alteration of awareness: Secondary | ICD-10-CM | POA: Diagnosis not present

## 2022-07-19 DIAGNOSIS — I10 Essential (primary) hypertension: Secondary | ICD-10-CM | POA: Diagnosis present

## 2022-07-19 DIAGNOSIS — N281 Cyst of kidney, acquired: Secondary | ICD-10-CM | POA: Diagnosis not present

## 2022-07-19 DIAGNOSIS — Z888 Allergy status to other drugs, medicaments and biological substances status: Secondary | ICD-10-CM

## 2022-07-19 DIAGNOSIS — N400 Enlarged prostate without lower urinary tract symptoms: Secondary | ICD-10-CM | POA: Diagnosis present

## 2022-07-19 DIAGNOSIS — J189 Pneumonia, unspecified organism: Secondary | ICD-10-CM | POA: Diagnosis not present

## 2022-07-19 LAB — CBC WITH DIFFERENTIAL/PLATELET
Abs Immature Granulocytes: 0.05 10*3/uL (ref 0.00–0.07)
Basophils Absolute: 0 10*3/uL (ref 0.0–0.1)
Basophils Relative: 0 %
Eosinophils Absolute: 0 10*3/uL (ref 0.0–0.5)
Eosinophils Relative: 0 %
HCT: 35.3 % — ABNORMAL LOW (ref 39.0–52.0)
Hemoglobin: 11.4 g/dL — ABNORMAL LOW (ref 13.0–17.0)
Immature Granulocytes: 1 %
Lymphocytes Relative: 9 %
Lymphs Abs: 1 10*3/uL (ref 0.7–4.0)
MCH: 30.4 pg (ref 26.0–34.0)
MCHC: 32.3 g/dL (ref 30.0–36.0)
MCV: 94.1 fL (ref 80.0–100.0)
Monocytes Absolute: 0.8 10*3/uL (ref 0.1–1.0)
Monocytes Relative: 8 %
Neutro Abs: 8.9 10*3/uL — ABNORMAL HIGH (ref 1.7–7.7)
Neutrophils Relative %: 82 %
Platelets: 218 10*3/uL (ref 150–400)
RBC: 3.75 MIL/uL — ABNORMAL LOW (ref 4.22–5.81)
RDW: 14.4 % (ref 11.5–15.5)
WBC: 10.8 10*3/uL — ABNORMAL HIGH (ref 4.0–10.5)
nRBC: 0 % (ref 0.0–0.2)

## 2022-07-19 LAB — COMPREHENSIVE METABOLIC PANEL
ALT: 11 U/L (ref 0–44)
AST: 19 U/L (ref 15–41)
Albumin: 3.2 g/dL — ABNORMAL LOW (ref 3.5–5.0)
Alkaline Phosphatase: 66 U/L (ref 38–126)
Anion gap: 9 (ref 5–15)
BUN: 16 mg/dL (ref 8–23)
CO2: 24 mmol/L (ref 22–32)
Calcium: 8.5 mg/dL — ABNORMAL LOW (ref 8.9–10.3)
Chloride: 100 mmol/L (ref 98–111)
Creatinine, Ser: 0.89 mg/dL (ref 0.61–1.24)
GFR, Estimated: >60 mL/min (ref >60)
Glucose, Bld: 101 mg/dL — ABNORMAL HIGH (ref 70–99)
Potassium: 4 mmol/L (ref 3.5–5.1)
Sodium: 133 mmol/L — ABNORMAL LOW (ref 135–145)
Total Bilirubin: 1.1 mg/dL (ref 0.3–1.2)
Total Protein: 6.1 g/dL — ABNORMAL LOW (ref 6.5–8.1)

## 2022-07-19 LAB — LIPASE, BLOOD: Lipase: 24 U/L (ref 11–51)

## 2022-07-19 LAB — LACTIC ACID, PLASMA
Lactic Acid, Venous: 1 mmol/L (ref 0.5–1.9)
Lactic Acid, Venous: 1.5 mmol/L (ref 0.5–1.9)

## 2022-07-19 LAB — URINALYSIS, ROUTINE W REFLEX MICROSCOPIC
Bilirubin Urine: NEGATIVE
Glucose, UA: NEGATIVE mg/dL
Hgb urine dipstick: NEGATIVE
Ketones, ur: 5 mg/dL — AB
Leukocytes,Ua: NEGATIVE
Nitrite: NEGATIVE
Protein, ur: NEGATIVE mg/dL
Specific Gravity, Urine: 1.017 (ref 1.005–1.030)
pH: 6 (ref 5.0–8.0)

## 2022-07-19 LAB — RESP PANEL BY RT-PCR (RSV, FLU A&B, COVID)  RVPGX2
Influenza A by PCR: NEGATIVE
Influenza B by PCR: NEGATIVE
Resp Syncytial Virus by PCR: NEGATIVE
SARS Coronavirus 2 by RT PCR: NEGATIVE

## 2022-07-19 LAB — GLUCOSE, CAPILLARY: Glucose-Capillary: 85 mg/dL (ref 70–99)

## 2022-07-19 MED ORDER — CLOPIDOGREL BISULFATE 75 MG PO TABS
75.0000 mg | ORAL_TABLET | Freq: Every day | ORAL | Status: DC
  Administered 2022-07-20 – 2022-07-21 (×2): 75 mg via ORAL
  Filled 2022-07-19 (×2): qty 1

## 2022-07-19 MED ORDER — PIPERACILLIN-TAZOBACTAM 3.375 G IVPB
INTRAVENOUS | Status: AC
  Filled 2022-07-19: qty 50

## 2022-07-19 MED ORDER — ACETAMINOPHEN 325 MG PO TABS
650.0000 mg | ORAL_TABLET | Freq: Four times a day (QID) | ORAL | Status: DC | PRN
  Administered 2022-07-20 (×2): 650 mg via ORAL
  Filled 2022-07-19 (×2): qty 2

## 2022-07-19 MED ORDER — FAMOTIDINE 20 MG PO TABS
20.0000 mg | ORAL_TABLET | Freq: Two times a day (BID) | ORAL | Status: DC
  Administered 2022-07-19 – 2022-07-21 (×4): 20 mg via ORAL
  Filled 2022-07-19 (×4): qty 1

## 2022-07-19 MED ORDER — ENOXAPARIN SODIUM 40 MG/0.4ML IJ SOSY
40.0000 mg | PREFILLED_SYRINGE | INTRAMUSCULAR | Status: DC
  Administered 2022-07-19 – 2022-07-20 (×2): 40 mg via SUBCUTANEOUS
  Filled 2022-07-19 (×2): qty 0.4

## 2022-07-19 MED ORDER — CLOPIDOGREL BISULFATE 75 MG PO TABS: 75.0000 mg | ORAL_TABLET | Freq: Every day | ORAL | Status: DC

## 2022-07-19 MED ORDER — SODIUM CHLORIDE 0.9 % IV SOLN: 500.0000 mg | INTRAVENOUS | Status: DC

## 2022-07-19 MED ORDER — LACTATED RINGERS IV BOLUS
30.0000 mL/kg | Freq: Once | INTRAVENOUS | Status: AC
  Administered 2022-07-19: 1932 mL via INTRAVENOUS

## 2022-07-19 MED ORDER — VANCOMYCIN HCL 1500 MG/300ML IV SOLN
1500.0000 mg | Freq: Once | INTRAVENOUS | Status: AC
  Administered 2022-07-19: 1500 mg via INTRAVENOUS
  Filled 2022-07-19: qty 300

## 2022-07-19 MED ORDER — PIPERACILLIN-TAZOBACTAM 3.375 G IVPB 30 MIN
3.3750 g | Freq: Once | INTRAVENOUS | Status: AC
  Administered 2022-07-19: 3.375 g via INTRAVENOUS
  Filled 2022-07-19: qty 50

## 2022-07-19 MED ORDER — ACETAMINOPHEN 325 MG PO TABS
650.0000 mg | ORAL_TABLET | Freq: Once | ORAL | Status: AC
  Administered 2022-07-19: 650 mg via ORAL
  Filled 2022-07-19: qty 2

## 2022-07-19 MED ORDER — LATANOPROST 0.005 % OP SOLN
1.0000 [drp] | Freq: Every day | OPHTHALMIC | Status: DC
  Administered 2022-07-19 – 2022-07-20 (×2): 1 [drp] via OPHTHALMIC
  Filled 2022-07-19: qty 2.5

## 2022-07-19 MED ORDER — ACETAMINOPHEN 650 MG RE SUPP: 650.0000 mg | Freq: Four times a day (QID) | RECTAL | Status: DC | PRN

## 2022-07-19 MED ORDER — SODIUM CHLORIDE 0.9 % IV SOLN
500.0000 mg | INTRAVENOUS | Status: DC
  Administered 2022-07-19 – 2022-07-20 (×2): 500 mg via INTRAVENOUS
  Filled 2022-07-19 (×2): qty 5

## 2022-07-19 MED ORDER — CHLORHEXIDINE GLUCONATE CLOTH 2 % EX PADS
6.0000 | MEDICATED_PAD | Freq: Every day | CUTANEOUS | Status: DC
  Administered 2022-07-19 – 2022-07-21 (×3): 6 via TOPICAL

## 2022-07-19 MED ORDER — IOHEXOL 350 MG/ML SOLN
100.0000 mL | Freq: Once | INTRAVENOUS | Status: AC | PRN
  Administered 2022-07-19: 100 mL via INTRAVENOUS

## 2022-07-19 MED ORDER — SODIUM CHLORIDE 0.9 % IV SOLN
2.0000 g | INTRAVENOUS | Status: DC
  Administered 2022-07-19 – 2022-07-21 (×2): 2 g via INTRAVENOUS
  Filled 2022-07-19 (×2): qty 20

## 2022-07-19 MED ORDER — ONDANSETRON HCL 4 MG/2ML IJ SOLN: 4.0000 mg | Freq: Four times a day (QID) | INTRAMUSCULAR | Status: DC | PRN

## 2022-07-19 MED ORDER — SODIUM CHLORIDE 0.9 % IV SOLN: 2.0000 g | INTRAVENOUS | Status: DC

## 2022-07-19 MED ORDER — VANCOMYCIN HCL IN DEXTROSE 1-5 GM/200ML-% IV SOLN: 1000.0000 mg | INTRAVENOUS | Status: DC

## 2022-07-19 MED ORDER — ONDANSETRON HCL 4 MG PO TABS: 4.0000 mg | ORAL_TABLET | Freq: Four times a day (QID) | ORAL | Status: DC | PRN
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
## 2022-07-19 NOTE — Progress Notes (Signed)
2303 pt arrived to unit on tele, R/A, via stretcher. Pt appears to nod head appropriately but baseline aphasic, pt has no complaints at this time VSS.

## 2022-07-19 NOTE — ED Provider Notes (Signed)
[place] Provider Note   CSN: [id number] Arrival date & time: 07/19/22  1323     History  Chief Complaint  Patient presents with   Fever   Cough   Abdominal Pain    Paul Bradshaw is a 89 y.o. male.  History of CVA with right-sided hemiparesis and aphasia.  Presents the ED today via EMS for several days of fever and cough   Fever Associated symptoms: cough   Cough Associated symptoms: fever   Abdominal Pain Associated symptoms: cough and fever        Home Medications Prior to Admission medications   Medication Sig Start Date End Date Taking? Authorizing Provider  cetirizine (ZYRTEC) 5 MG tablet Take 1 tablet (5 mg total) by mouth daily. 07/11/22   Sharion Balloon, FNP  clopidogrel (PLAVIX) 75 MG tablet TAKE ONE TABLET EVERY DAY WITH BREAKFAST 07/07/22   Baruch Gouty, FNP  famotidine (PEPCID) 20 MG tablet TAKE ONE TABLET TWICE DAILY 05/02/22   Baruch Gouty, FNP  metoprolol tartrate (LOPRESSOR) 25 MG tablet TAKE 1/2 TABLET TWICE DAILY 07/15/22   Baruch Gouty, FNP      Allergies    Amlodipine and Lisinopril    Review of Systems   Review of Systems  Constitutional:  Positive for fever.  Respiratory:  Positive for cough.   Gastrointestinal:  Positive for abdominal pain.    Physical Exam Updated Vital Signs There were no vitals taken for this visit. Physical Exam Vitals and nursing note reviewed.  Constitutional:      General: He is not in acute distress.    Appearance: He is ill-appearing.  HENT:     Head: Normocephalic and atraumatic.  Eyes:     Conjunctiva/sclera: Conjunctivae normal.  Cardiovascular:     Rate and Rhythm: Normal rate and regular rhythm.     Heart sounds: No murmur heard. Pulmonary:     Effort: Pulmonary effort is normal. No respiratory distress.     Breath sounds: Normal breath sounds.  Abdominal:     Palpations: Abdomen is soft.     Tenderness: There is no abdominal tenderness.   Musculoskeletal:        General: No swelling.     Cervical back: Neck supple.  Skin:    General: Skin is warm and dry.     Capillary Refill: Capillary refill takes less than 2 seconds.  Neurological:     Mental Status: He is alert.  Psychiatric:        Mood and Affect: Mood normal.     ED Results / Procedures / Treatments   Labs (all labs ordered are listed, but only abnormal results are displayed) Labs Reviewed  RESP PANEL BY RT-PCR (RSV, FLU A&B, COVID)  RVPGX2  CULTURE, BLOOD (ROUTINE X 2)  CULTURE, BLOOD (ROUTINE X 2)  COMPREHENSIVE METABOLIC PANEL  CBC WITH DIFFERENTIAL/PLATELET  URINALYSIS, ROUTINE W REFLEX MICROSCOPIC  LACTIC ACID, PLASMA  LACTIC ACID, PLASMA    EKG None  Radiology No results found.  Procedures Procedures    Medications Ordered in ED Medications - No data to display  ED Course/ Medical Decision Making/ A&P                             Medical Decision Making This patient presents to the ED for concern of fever, generalized weakness, mild cough, this involves an extensive number of treatment options, and  is a complaint that carries with it a high risk of complications and morbidity.  The differential diagnosis includes pneumonia, influenza, covid-19 sepsis,    Co morbidities that complicate the patient evaluation  Prior CVA, history of SVT   Additional history obtained:  Additional history obtained from EMR External records from outside source obtained and reviewed including prior discharge summary   Lab Tests:  I Ordered, and personally interpreted labs.  The pertinent results include:  negative nasal swab for COVID, flu, RSV.     Imaging Studies ordered:  I ordered chest x-ray, CTA chest with CT abdomen pelvis.I independently visualized and interpreted imaging which showed chest x-ray showed no acute disease, CTA of the chest showed no PE but patient does have possible right upper lobe pneumonia which be consistent with his  cough and fever.  He has small bilateral pleural effusions CT abdomen pelvis shows hiatal hernia no other acute findings I agree with the radiologist interpretation   Cardiac Monitoring: / EKG:  The patient was maintained on a cardiac monitor.  I personally viewed and interpreted the cardiac monitored which showed an underlying rhythm of: Sinus rhythm   Consultations Obtained:  I requested consultation with the hospitalist, Dr. Nehemiah Settle,  and discussed lab and imaging findings as well as pertinent plan - they recommend: Admission    Problem List / ED Course / Critical interventions / Medication management  Fever, malaise and ambulatory dysfunction-patient came in with 2 days of cough, fever that developed today, patient normally is able to ambulate, and per his family he was able to participate in physical therapy yesterday but today had generalized weakness, not able to get up and walk with his walker as usual, not even able to transfer to get into the car on his own.  Patient arrived via EMS, patient was found to be febrile with some mild tachypnea, treated fever with Tylenol which improved, he had some abdominal tenderness on exam, chest x-ray was reassuring.  Blood pressure was initially soft, fluid bolus ordered in light of his normal EF on his last echocardiogram, patient's blood pressure further dropped initially to 80s over 40s and then improved, currently 105/72. CT chest abdomen pelvis was ordered to further evaluate for possible source of infection given otherwise reassuring workup with fever of unknown origin, concern for sepsis including bacteremia.  Patient was treated with empiric antibiotics.  CT of the chest as above shows possible right upper lobe pneumonia which is consistent with patient's symptoms.  Hospitalist consulted for admission  I ordered medication including Tylenol for fever, vancomycin, Zosyn for fever of unknown origin with SIRS criteria-ultimately found to be  likely right upper lobe pneumonia Reevaluation of the patient after these medicines showed that the patient improved I have reviewed the patients home medicines and have made adjustments as needed   Social Determinants of Health:  History of stroke aphasia and right hemiparesis       Amount and/or Complexity of Data Reviewed Labs: ordered. Radiology: ordered.           Final Clinical Impression(s) / ED Diagnoses Final diagnoses:  None    Rx / DC Orders ED Discharge Orders     None         Darci Current 07/19/22 1757    Noemi Chapel, MD 07/20/22 [phone]

## 2022-07-19 NOTE — ED Notes (Signed)
Family verbalized pt is DNR and will allow pt to be intubated.

## 2022-07-19 NOTE — ED Triage Notes (Signed)
Went to dr a week ago and s/s persists with a fever at this time

## 2022-07-19 NOTE — Progress Notes (Addendum)
Pharmacy Antibiotic Note  Paul Bradshaw is a 89 y.o. male admitted on 07/19/2022 with fevers, possible bacteremia.  Pharmacy has been consulted for Vancomycin dosing.  Plan: Vancomycin 1500mg  loading dose Vancomycin 1gm q 24h Monitory renal function, and clinical course  Height: 5\' 8"  (172.7 cm) Weight: 64.4 kg (141 lb 15.6 oz) IBW/kg (Calculated) : 68.4 Ke 0.043, Vd 46, predicted AUC 504  Temp (24hrs), Avg:100.7 F (38.2 C), Min:98.9 F (37.2 C), Max:102.4 F (39.1 C)  Recent Labs  Lab 07/17/22 1000 07/19/22 1350  WBC 7.5 10.8*  CREATININE 1.20 0.89  LATICACIDVEN  --  1.0    Estimated Creatinine Clearance: 46.2 mL/min (by C-G formula based on SCr of 0.89 mg/dL).    Allergies  Allergen Reactions   Amlodipine Swelling    Swelling of lips.   Lisinopril Other (See Comments)    Elevated BP higher & causes a burning feeling on the inside.     Antimicrobials this admission: 1/27 zosyn  Microbiology results: 1/27 BCx: P  Thank you for allowing pharmacy to be a part of this patient's care.  Lorenso Courier, PharmD Clinical Pharmacist 07/19/2022 3:52 PM

## 2022-07-19 NOTE — ED Notes (Signed)
PA aware of soft bp .

## 2022-07-19 NOTE — H&P (Signed)
History and Physical    Patient: Paul Bradshaw AOZ:[id number] DOB: May 15, 1926 DOA: 07/19/2022 DOS: the patient was seen and examined on 07/19/2022 PCP: Sonny Masters, FNP  Patient coming from: Home  Chief Complaint:  Chief Complaint  Patient presents with   Fever   Cough   Abdominal Pain   HPI: Paul Bradshaw is a 89 y.o. 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  Patient presents with fatigue and weakness over the past day.  He has been having fevers, cough, shortness of breath over the past 2 days.  He was brought here by EMS and was found to be febrile initially.  Due to the patient's aphasia, he is not able to contribute to the history taking.  His blood pressure was initially soft.  He received fluid bolus of 30 mL/kg.  Additionally, he was started on broad-spectrum antibiotics for sepsis.  Review of Systems: As mentioned in the history of present illness. All other systems reviewed and are negative. Past Medical History:  Diagnosis Date   Aortic regurgitation    Moderate   Arthritis    BPH (benign prostatic hyperplasia)    Cervical disc disease    Cervical radiculopathy    Hyperlipidemia    Hypertension    Prediabetes 02/16/2021   Staphylococcus aureus bacteremia 08/09/2012   TEE negative for vegetation or thrombus February 2014   Stroke [facility]) 2005 or 2006   3   Symptomatic carotid artery stenosis with infarction [facility]) 2005 or 2006   Status post right carotid endarterectomy   Past Surgical History:  Procedure Laterality Date   BACK SURGERY     CAROTID ENDARTERECTOMY     CATARACT EXTRACTION W/PHACO Left 02/15/2015   Procedure: CATARACT EXTRACTION PHACO AND INTRAOCULAR LENS PLACEMENT LEFT EYE CDE=30.93;  Surgeon: Gemma Payor, MD;  Location: [facility];  Service: Ophthalmology;  Laterality: Left;   CHOLECYSTECTOMY     EYE SURGERY     KIDNEY STONE SURGERY      LUMBAR LAMINECTOMY/DECOMPRESSION MICRODISCECTOMY Bilateral 01/23/2014   Procedure: LUMBAR LAMINECTOMY/DECOMPRESSION MICRODISCECTOMY 1 LEVEL L5-S1;  Surgeon: Temple Pacini, MD;  Location: [facility];  Service: Neurosurgery;  Laterality: Bilateral;  LUMBAR LAMINECTOMY/DECOMPRESSION MICRODISCECTOMY 1 LEVEL L5-S1   TEE WITHOUT CARDIOVERSION N/A 08/12/2012   Procedure: TRANSESOPHAGEAL ECHOCARDIOGRAM (TEE);  Surgeon: Wendall Stade, MD;  Location: [facility];  Service: Cardiovascular;  Laterality: N/A;   TEE WITHOUT CARDIOVERSION N/A 06/24/2013   Procedure: TRANSESOPHAGEAL ECHOCARDIOGRAM (TEE);  Surgeon: Jonelle Sidle, MD;  Location: [facility];  Service: Endoscopy;  Laterality: N/A;   Social History:  reports that he has never smoked. He has never used smokeless tobacco. He reports that he does not drink alcohol and does not use drugs.  Allergies  Allergen Reactions   Amlodipine Swelling    Swelling of lips.   Lisinopril Other (See Comments)    Elevated BP higher & causes a burning feeling on the inside.     Family History  Problem Relation Age of Onset   Emphysema Father    Alzheimer's disease Mother    Heart disease Brother    Heart attack Brother    COPD Son     Prior to Admission medications   Medication Sig Start Date End Date Taking? Authorizing Provider  clopidogrel (PLAVIX) 75 MG tablet TAKE ONE TABLET EVERY DAY WITH BREAKFAST Patient taking differently: Take 75 mg by mouth daily. 07/07/22  Yes Rakes,  Doralee Albino, FNP  famotidine (PEPCID) 20 MG tablet TAKE ONE TABLET TWICE DAILY 05/02/22  Yes Rakes, Doralee Albino, FNP  latanoprost (XALATAN) 0.005 % ophthalmic solution Place 1 drop into both eyes at bedtime. 07/17/22  Yes [provider]  metoprolol tartrate (LOPRESSOR) 25 MG tablet TAKE 1/2 TABLET TWICE DAILY 07/15/22  Yes Rakes, Doralee Albino, FNP  cetirizine (ZYRTEC) 5 MG tablet Take 1 tablet (5 mg total) by mouth daily. Patient not taking: Reported on 07/19/2022 07/11/22    Junie Spencer, FNP    Physical Exam: Vitals:   07/19/22 1715 07/19/22 1730 07/19/22 1733 07/19/22 1800  BP:  (!) 105/39  (!) 103/49  Pulse:  (!) 56  (!) 54  Resp:  15  (!) 21  Temp:   98.7 F (37.1 C)   TempSrc:   Oral   SpO2: 92% (!) 87%  98%  Weight:      Height:       General: Elderly male. Awake and alert and oriented x3. No acute cardiopulmonary distress.  HEENT: Normocephalic atraumatic.  Right and left ears normal in appearance.  Pupils equal, round, reactive to light. Extraocular muscles are intact. Sclerae anicteric and noninjected.  Moist mucosal membranes. No mucosal lesions.  Neck: Neck supple without lymphadenopathy. No carotid bruits. No masses palpated.  Cardiovascular: Regular rate with normal S1-S2 sounds. No murmurs, rubs, gallops auscultated. No JVD.  Respiratory: Good respiratory effort.  Unable to appreciate rales.  No accessory muscle use. Abdomen: Soft, nontender, nondistended. Active bowel sounds. No masses or hepatosplenomegaly  Skin: No rashes, lesions, or ulcerations.  Dry, warm to touch. 2+ dorsalis pedis and radial pulses. Musculoskeletal: No calf or leg pain. All major joints not erythematous nontender.  No upper or lower joint deformation.  Good ROM.  No contractures  Psychiatric: Intact judgment and insight. Pleasant and cooperative. Neurologic: No focal neurological deficits. Strength is 5/5 and symmetric in upper and lower extremities.  Cranial nerves II through XII are grossly intact.  Data Reviewed: Results for orders placed or performed during the hospital encounter of 07/19/22 (from the past 24 hour(s))  Comprehensive metabolic panel     Status: Abnormal   Collection Time: 07/19/22  1:50 PM  Result Value Ref Range   Sodium 133 (L) 135 - 145 mmol/L   Potassium 4.0 3.5 - 5.1 mmol/L   Chloride 100 98 - 111 mmol/L   CO2 24 22 - 32 mmol/L   Glucose, Bld 101 (H) 70 - 99 mg/dL   BUN 16 8 - 23 mg/dL   Creatinine, Ser 8.10 0.61 - 1.24 mg/dL    Calcium 8.5 (L) 8.9 - 10.3 mg/dL   Total Protein 6.1 (L) 6.5 - 8.1 g/dL   Albumin 3.2 (L) 3.5 - 5.0 g/dL   AST 19 15 - 41 U/L   ALT 11 0 - 44 U/L   Alkaline Phosphatase 66 38 - 126 U/L   Total Bilirubin 1.1 0.3 - 1.2 mg/dL   GFR, Estimated >17 >51 mL/min   Anion gap 9 5 - 15  CBC with Differential     Status: Abnormal   Collection Time: 07/19/22  1:50 PM  Result Value Ref Range   WBC 10.8 (H) 4.0 - 10.5 K/uL   RBC 3.75 (L) 4.22 - 5.81 MIL/uL   Hemoglobin 11.4 (L) 13.0 - 17.0 g/dL   HCT 02.5 (L) 85.2 - 77.8 %   MCV 94.1 80.0 - 100.0 fL   MCH 30.4 26.0 - 34.0 pg   MCHC  32.3 30.0 - 36.0 g/dL   RDW 14.4 11.5 - 15.5 %   Platelets 218 150 - 400 K/uL   nRBC 0.0 0.0 - 0.2 %   Neutrophils Relative % 82 %   Neutro Abs 8.9 (H) 1.7 - 7.7 K/uL   Lymphocytes Relative 9 %   Lymphs Abs 1.0 0.7 - 4.0 K/uL   Monocytes Relative 8 %   Monocytes Absolute 0.8 0.1 - 1.0 K/uL   Eosinophils Relative 0 %   Eosinophils Absolute 0.0 0.0 - 0.5 K/uL   Basophils Relative 0 %   Basophils Absolute 0.0 0.0 - 0.1 K/uL   Immature Granulocytes 1 %   Abs Immature Granulocytes 0.05 0.00 - 0.07 K/uL  Blood culture (routine x 2)     Status: None (Preliminary result)   Collection Time: 07/19/22  1:50 PM   Specimen: BLOOD RIGHT FOREARM  Result Value Ref Range   Specimen Description      BLOOD RIGHT FOREARM BOTTLES DRAWN AEROBIC AND ANAEROBIC   Special Requests      Blood Culture adequate volume Performed at [facility], [address], [place], [place] [address]    Culture PENDING    Report Status PENDING   Blood culture (routine x 2)     Status: None (Preliminary result)   Collection Time: 07/19/22  1:50 PM   Specimen: Left Antecubital; Blood  Result Value Ref Range   Specimen Description      LEFT ANTECUBITAL BOTTLES DRAWN AEROBIC AND ANAEROBIC   Special Requests      Blood Culture adequate volume Performed at [facility], [address], [place], [place] [address]    Culture PENDING    Report Status  PENDING   Lactic acid, plasma     Status: None   Collection Time: 07/19/22  1:50 PM  Result Value Ref Range   Lactic Acid, Venous 1.0 0.5 - 1.9 mmol/L  Lipase, blood     Status: None   Collection Time: 07/19/22  1:50 PM  Result Value Ref Range   Lipase 24 11 - 51 U/L  Resp panel by RT-PCR (RSV, Flu A&B, Covid) Anterior Nasal Swab     Status: None   Collection Time: 07/19/22  2:00 PM   Specimen: Anterior Nasal Swab  Result Value Ref Range   SARS Coronavirus 2 by RT PCR NEGATIVE NEGATIVE   Influenza A by PCR NEGATIVE NEGATIVE   Influenza B by PCR NEGATIVE NEGATIVE   Resp Syncytial Virus by PCR NEGATIVE NEGATIVE  Urinalysis, Routine w reflex microscopic -Urine, Catheterized     Status: Abnormal   Collection Time: 07/19/22  2:16 PM  Result Value Ref Range   Color, Urine YELLOW YELLOW   APPearance CLEAR CLEAR   Specific Gravity, Urine 1.017 1.005 - 1.030   pH 6.0 5.0 - 8.0   Glucose, UA NEGATIVE NEGATIVE mg/dL   Hgb urine dipstick NEGATIVE NEGATIVE   Bilirubin Urine NEGATIVE NEGATIVE   Ketones, ur 5 (A) NEGATIVE mg/dL   Protein, ur NEGATIVE NEGATIVE mg/dL   Nitrite NEGATIVE NEGATIVE   Leukocytes,Ua NEGATIVE NEGATIVE  Lactic acid, plasma     Status: None   Collection Time: 07/19/22  3:50 PM  Result Value Ref Range   Lactic Acid, Venous 1.5 0.5 - 1.9 mmol/L   CT Angio Chest PE W and/or Wo Contrast  Result Date: 07/19/2022 CLINICAL DATA:  Pulmonary embolism (PE) suspected, high prob Cough, fever, flu like symptoms. EXAM: CT ANGIOGRAPHY CHEST WITH CONTRAST TECHNIQUE: Multidetector CT imaging of  the chest was performed using the standard protocol during bolus administration of intravenous contrast. Multiplanar CT image reconstructions and MIPs were obtained to evaluate the vascular anatomy. RADIATION DOSE REDUCTION: This exam was performed according to the departmental dose-optimization program which includes automated exposure control, adjustment of the mA and/or kV according to patient  size and/or use of iterative reconstruction technique. CONTRAST:  136mL OMNIPAQUE IOHEXOL 350 MG/ML SOLN COMPARISON:  Radiograph earlier today. FINDINGS: Cardiovascular: There are no filling defects within the pulmonary arteries to suggest pulmonary embolus. Moderate aortic atherosclerosis and tortuosity. No acute aortic findings. Normal heart size with coronary artery calcifications. Trace pericardial effusion. Mediastinum/Nodes: Large hiatal hernia with the majority of the stomach being intrathoracic. No abnormal gastric distension. No mediastinal or hilar adenopathy. Lungs/Pleura: Breathing motion artifact. Faint tree-in-bud and ground-glass nodularity within the right upper lobe. There is moderate diffuse bronchial thickening. Small bilateral pleural effusions, right greater than left. Areas of septal thickening may represent pulmonary edema. No pulmonary mass. No endobronchial lesion. Upper Abdomen: Assessed on concurrent abdominal CT, reported separately. Musculoskeletal: There are no acute or suspicious osseous abnormalities. Thoracic spondylosis with bilateral shoulder osteoarthritis. No chest wall soft tissue abnormalities. Review of the MIP images confirms the above findings. IMPRESSION: 1. No pulmonary embolus. 2. Faint tree-in-bud and ground-glass nodularity within the right upper lobe, likely infectious or inflammatory. 3. Small bilateral pleural effusions, right greater than left. Areas of septal thickening may represent pulmonary edema. 4. Large hiatal hernia with the majority of the stomach being intrathoracic. 5. Aortic atherosclerosis.  Coronary artery calcifications. Aortic Atherosclerosis (ICD10-I70.0). Electronically Signed   By: Keith Rake M.D.   On: 07/19/2022 17:19   CT ABDOMEN PELVIS W CONTRAST  Result Date: 07/19/2022 CLINICAL DATA:  Acute generalized abdominal pain. EXAM: CT ABDOMEN AND PELVIS WITH CONTRAST TECHNIQUE: Multidetector CT imaging of the abdomen and pelvis was  performed using the standard protocol following bolus administration of intravenous contrast. RADIATION DOSE REDUCTION: This exam was performed according to the departmental dose-optimization program which includes automated exposure control, adjustment of the mA and/or kV according to patient size and/or use of iterative reconstruction technique. CONTRAST:  118mL OMNIPAQUE IOHEXOL 350 MG/ML SOLN COMPARISON:  May 06, 2021. FINDINGS: Lower chest: Minimal bilateral pleural effusions are noted with minimal adjacent subsegmental atelectasis. Hepatobiliary: No focal liver abnormality is seen. Status post cholecystectomy. No biliary dilatation. Pancreas: Unremarkable. No pancreatic ductal dilatation or surrounding inflammatory changes. Spleen: Normal in size without focal abnormality. Adrenals/Urinary Tract: Adrenal glands appear normal. Stable right renal cyst is noted for which no further follow-up is required. No hydronephrosis or renal obstruction is noted. Urinary bladder is unremarkable. Stomach/Bowel: Moderate size sliding-type hiatal hernia. There is no evidence of bowel obstruction or inflammation. The appendix is not well visualized. Vascular/Lymphatic: Aortic atherosclerosis. No enlarged abdominal or pelvic lymph nodes. Reproductive: Stable mild prostatic enlargement. Other: No abdominal wall hernia or abnormality. No abdominopelvic ascites. Musculoskeletal: No acute or significant osseous findings. IMPRESSION: Minimal bilateral pleural effusions are noted with minimal adjacent subsegmental atelectasis. Moderate size sliding-type hiatal hernia. Stable mild prostatic enlargement. No acute abnormality seen in the abdomen or pelvis. Aortic Atherosclerosis (ICD10-I70.0). Electronically Signed   By: Marijo Conception M.D.   On: 07/19/2022 17:16   DG Chest Portable 1 View  Result Date: 07/19/2022 CLINICAL DATA:  Cough and fever EXAM: PORTABLE CHEST 1 VIEW COMPARISON:  May 10, 2022 FINDINGS: Hiatal hernia.  The heart, hila, mediastinum, lungs, pleura otherwise unremarkable. IMPRESSION: No active disease. Electronically Signed  By: Gerome Sam III M.D.   On: 07/19/2022 14:30     Assessment and Plan: No notes have been filed under this hospital service. Service: Hospitalist  Active Problems:   BPH (benign prostatic hyperplasia)   Essential hypertension   Aphasia due to recent cerebral infarction   Sepsis (HCC)   CAP (community acquired pneumonia)  Sepsis secondary to commune acquired pneumonia Antibiotics: Rocephin and azithromycin Robitussin Blood cultures drawn in the emergency department Sputum cultures CBC tomorrow Strep and Legionella antigen by urine Influenza screen, COVID screen, RSV negative Aphasia secondary to stroke  Hypertension Will hold blood pressure medication for the moment BPH   Advance Care Planning:   Code Status: DNR confirmed by patient's family  Consults: None  Family Communication: Son and great granddaughter present  Severity of Illness: The appropriate patient status for this patient is INPATIENT. Inpatient status is judged to be reasonable and necessary in order to provide the required intensity of service to ensure the patient's safety. The patient's presenting symptoms, physical exam findings, and initial radiographic and laboratory data in the context of their chronic comorbidities is felt to place them at high risk for further clinical deterioration. Furthermore, it is not anticipated that the patient will be medically stable for discharge from the hospital within 2 midnights of admission.   * I certify that at the point of admission it is my clinical judgment that the patient will require inpatient hospital care spanning beyond 2 midnights from the point of admission due to high intensity of service, high risk for further deterioration and high frequency of surveillance required.*  Author: Levie Heritage, DO 07/19/2022 6:21 PM  For on call  review [email or web address].

## 2022-07-20 DIAGNOSIS — J189 Pneumonia, unspecified organism: Secondary | ICD-10-CM

## 2022-07-20 LAB — BASIC METABOLIC PANEL
Anion gap: 11 (ref 5–15)
BUN: 13 mg/dL (ref 8–23)
CO2: 24 mmol/L (ref 22–32)
Calcium: 8.6 mg/dL — ABNORMAL LOW (ref 8.9–10.3)
Chloride: 100 mmol/L (ref 98–111)
Creatinine, Ser: 0.91 mg/dL (ref 0.61–1.24)
GFR, Estimated: >60 mL/min (ref >60)
Glucose, Bld: 95 mg/dL (ref 70–99)
Potassium: 3.3 mmol/L — ABNORMAL LOW (ref 3.5–5.1)
Sodium: 135 mmol/L (ref 135–145)

## 2022-07-20 LAB — URINALYSIS, ROUTINE W REFLEX MICROSCOPIC
Bilirubin Urine: NEGATIVE
Glucose, UA: NEGATIVE mg/dL
Ketones, ur: 20 mg/dL — AB
Leukocytes,Ua: NEGATIVE
Nitrite: NEGATIVE
Protein, ur: NEGATIVE mg/dL
RBC / HPF: >50 RBC/hpf (ref 0–5)
Specific Gravity, Urine: 1.014 (ref 1.005–1.030)
pH: 6 (ref 5.0–8.0)

## 2022-07-20 LAB — CBC
HCT: 36.7 % — ABNORMAL LOW (ref 39.0–52.0)
Hemoglobin: 11.8 g/dL — ABNORMAL LOW (ref 13.0–17.0)
MCH: 30.5 pg (ref 26.0–34.0)
MCHC: 32.2 g/dL (ref 30.0–36.0)
MCV: 94.8 fL (ref 80.0–100.0)
Platelets: 213 10*3/uL (ref 150–400)
RBC: 3.87 MIL/uL — ABNORMAL LOW (ref 4.22–5.81)
RDW: 14.4 % (ref 11.5–15.5)
WBC: 9.8 10*3/uL (ref 4.0–10.5)
nRBC: 0 % (ref 0.0–0.2)

## 2022-07-20 LAB — STREP PNEUMONIAE URINARY ANTIGEN: Strep Pneumo Urinary Antigen: NEGATIVE

## 2022-07-20 LAB — MRSA NEXT GEN BY PCR, NASAL: MRSA by PCR Next Gen: DETECTED — AB

## 2022-07-20 MED ORDER — POTASSIUM CHLORIDE CRYS ER 20 MEQ PO TBCR
40.0000 meq | EXTENDED_RELEASE_TABLET | Freq: Once | ORAL | Status: AC
  Administered 2022-07-20: 40 meq via ORAL
  Filled 2022-07-20: qty 2

## 2022-07-20 NOTE — Plan of Care (Signed)

## 2022-07-20 NOTE — Plan of Care (Signed)
  Problem: Activity: Goal: Ability to tolerate increased activity will improve 07/20/2022 1837 by Cyndra Numbers, RN Outcome: Progressing 07/20/2022 1836 by Cyndra Numbers, RN Outcome: Progressing   Problem: Clinical Measurements: Goal: Ability to maintain a body temperature in the normal range will improve 07/20/2022 1837 by Cyndra Numbers, RN Outcome: Progressing 07/20/2022 1836 by Cyndra Numbers, RN Outcome: Progressing   Problem: Respiratory: Goal: Ability to maintain adequate ventilation will improve 07/20/2022 1837 by Cyndra Numbers, RN Outcome: Progressing 07/20/2022 1836 by Cyndra Numbers, RN Outcome: Progressing Goal: Ability to maintain a clear airway will improve 07/20/2022 1837 by Cyndra Numbers, RN Outcome: Progressing 07/20/2022 1836 by Cyndra Numbers, RN Outcome: Progressing   Problem: Education: Goal: Knowledge of General Education information will improve Description: Including pain rating scale, medication(s)/side effects and non-pharmacologic comfort measures 07/20/2022 1837 by Cyndra Numbers, RN Outcome: Progressing 07/20/2022 1836 by Cyndra Numbers, RN Outcome: Progressing   Problem: Health Behavior/Discharge Planning: Goal: Ability to manage health-related needs will improve 07/20/2022 1837 by Cyndra Numbers, RN Outcome: Progressing 07/20/2022 1836 by Cyndra Numbers, RN Outcome: Progressing   Problem: Clinical Measurements: Goal: Ability to maintain clinical measurements within normal limits will improve 07/20/2022 1837 by Cyndra Numbers, RN Outcome: Progressing 07/20/2022 1836 by Cyndra Numbers, RN Outcome: Progressing Goal: Will remain free from infection 07/20/2022 1837 by Cyndra Numbers, RN Outcome: Progressing 07/20/2022 1836 by Cyndra Numbers, RN Outcome: Progressing Goal: Diagnostic test results will improve 07/20/2022 1837 by Cyndra Numbers, RN Outcome: Progressing 07/20/2022 1836 by Cyndra Numbers, RN Outcome: Progressing Goal: Respiratory complications will improve 07/20/2022 1837 by Cyndra Numbers, RN Outcome:  Progressing 07/20/2022 1836 by Cyndra Numbers, RN Outcome: Progressing Goal: Cardiovascular complication will be avoided 07/20/2022 1837 by Cyndra Numbers, RN Outcome: Progressing 07/20/2022 1836 by Cyndra Numbers, RN Outcome: Progressing   Problem: Activity: Goal: Risk for activity intolerance will decrease 07/20/2022 1837 by Cyndra Numbers, RN Outcome: Progressing 07/20/2022 1836 by Cyndra Numbers, RN Outcome: Progressing   Problem: Nutrition: Goal: Adequate nutrition will be maintained 07/20/2022 1837 by Cyndra Numbers, RN Outcome: Progressing 07/20/2022 1836 by Cyndra Numbers, RN Outcome: Progressing   Problem: Coping: Goal: Level of anxiety will decrease 07/20/2022 1837 by Cyndra Numbers, RN Outcome: Progressing 07/20/2022 1836 by Cyndra Numbers, RN Outcome: Progressing   Problem: Elimination: Goal: Will not experience complications related to bowel motility 07/20/2022 1837 by Cyndra Numbers, RN Outcome: Progressing 07/20/2022 1836 by Cyndra Numbers, RN Outcome: Progressing Goal: Will not experience complications related to urinary retention 07/20/2022 1837 by Cyndra Numbers, RN Outcome: Progressing 07/20/2022 1836 by Cyndra Numbers, RN Outcome: Progressing   Problem: Pain Managment: Goal: General experience of comfort will improve 07/20/2022 1837 by Cyndra Numbers, RN Outcome: Progressing 07/20/2022 1836 by Cyndra Numbers, RN Outcome: Progressing   Problem: Safety: Goal: Ability to remain free from injury will improve 07/20/2022 1837 by Cyndra Numbers, RN Outcome: Progressing 07/20/2022 1836 by Cyndra Numbers, RN Outcome: Progressing   Problem: Skin Integrity: Goal: Risk for impaired skin integrity will decrease 07/20/2022 1837 by Cyndra Numbers, RN Outcome: Progressing 07/20/2022 1836 by Cyndra Numbers, RN Outcome: Progressing

## 2022-07-20 NOTE — Progress Notes (Signed)
PROGRESS NOTE    Paul Bradshaw  [id number] DOB: 1926/03/03 DOA: 07/19/2022 PCP: Baruch Gouty, FNP   Brief Narrative:    Paul Bradshaw is a 89 y.o. 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  Patient presents with fatigue and weakness over the past day.  He has been having fevers, cough, shortness of breath over the past 2 days.  Patient was admitted for sepsis, present on admission secondary to community-acquired pneumonia and has been started on empiric Rocephin and azithromycin.  Assessment & Plan:   Active Problems:   BPH (benign prostatic hyperplasia)   Essential hypertension   Aphasia due to recent cerebral infarction   Sepsis ([place])   CAP (community acquired pneumonia)  Assessment and Plan:  Sepsis secondary to community-acquired pneumonia, POA Antibiotics: Rocephin and azithromycin Robitussin Blood cultures with no growth to date Sputum cultures CBC tomorrow Strep and Legionella antigen by urine pending Influenza screen, COVID screen, RSV negative Aphasia secondary to stroke   Hypertension Okay to resume blood pressure medications as sepsis physiology appears to have resolved BPH Hypokalemia    DVT prophylaxis:Lovenox Code Status: DNR Family Communication: Discussed with son on phone 1/28 Disposition Plan:  Status is: Inpatient Remains inpatient appropriate because: Need for IV medications.  Consultants:  None  Procedures:  None  Antimicrobials:  Anti-infectives (From admission, onward)    Start     Dose/Rate Route Frequency Ordered Stop   07/20/22 1600  vancomycin (VANCOCIN) IVPB 1000 mg/200 mL premix  Status:  Discontinued        1,000 mg 200 mL/hr over 60 Minutes Intravenous Every 24 hours 07/19/22 1550 07/19/22 1925   07/20/22 0000  cefTRIAXone (ROCEPHIN) 2 g in sodium chloride 0.9 % 100 mL IVPB        2 g 200 mL/hr over 30 Minutes  Intravenous Every 24 hours 07/19/22 1906 07/24/22 2359   07/19/22 2200  cefTRIAXone (ROCEPHIN) 2 g in sodium chloride 0.9 % 100 mL IVPB  Status:  Discontinued        2 g 200 mL/hr over 30 Minutes Intravenous Every 24 hours 07/19/22 1859 07/19/22 1906   07/19/22 2000  azithromycin (ZITHROMAX) 500 mg in sodium chloride 0.9 % 250 mL IVPB        500 mg 250 mL/hr over 60 Minutes Intravenous Every 24 hours 07/19/22 1907 07/24/22 1959   07/19/22 1900  azithromycin (ZITHROMAX) 500 mg in sodium chloride 0.9 % 250 mL IVPB  Status:  Discontinued        500 mg 250 mL/hr over 60 Minutes Intravenous Every 24 hours 07/19/22 1859 07/19/22 1907   07/19/22 1600  vancomycin (VANCOREADY) IVPB 1500 mg/300 mL        1,500 mg 150 mL/hr over 120 Minutes Intravenous  Once 07/19/22 1550 07/19/22 1833   07/19/22 1545  piperacillin-tazobactam (ZOSYN) IVPB 3.375 g        3.375 g 100 mL/hr over 30 Minutes Intravenous  Once 07/19/22 1531 07/19/22 1758      Subjective: Patient seen and evaluated today with no new acute complaints or concerns. No acute concerns or events noted overnight.  Objective: Vitals:   07/20/22 0600 07/20/22 0630 07/20/22 0700 07/20/22 0814  BP: (!) 122/58 (!) 134/53 (!) 140/54 (!) 145/68  Pulse: 90 78 77 80  Resp: 18   17  Temp:    98.3 F (36.8 C)  TempSrc:    Oral  SpO2: 93%  96% 94% 98%  Weight:      Height:        Intake/Output Summary (Last 24 hours) at 07/20/2022 1059 Last data filed at 07/20/2022 0121 Gross per 24 hour  Intake 325.95 ml  Output 800 ml  Net -474.05 ml   Filed Weights   07/19/22 1346  Weight: 64.4 kg    Examination:  General exam: Appears calm and comfortable, aphasic Respiratory system: Clear to auscultation. Respiratory effort normal. Cardiovascular system: S1 & S2 heard, RRR.  Gastrointestinal system: Abdomen is soft Central nervous system: Alert and awake Extremities: No edema Skin: No significant lesions noted Psychiatry: Flat  affect.    Data Reviewed: I have personally reviewed following labs and imaging studies  CBC: Recent Labs  Lab 07/17/22 1000 07/19/22 1350 07/20/22 0548  WBC 7.5 10.8* 9.8  NEUTROABS 5.3 8.9*  --   HGB 12.5* 11.4* 11.8*  HCT 38.1 35.3* 36.7*  MCV 94 94.1 94.8  PLT 249 218 213   Basic Metabolic Panel: Recent Labs  Lab 07/17/22 1000 07/19/22 1350 07/20/22 0548  NA 140 133* 135  K 4.3 4.0 3.3*  CL 101 100 100  CO2 23 24 24   GLUCOSE 112* 101* 95  BUN 16 16 13   CREATININE 1.20 0.89 0.91  CALCIUM 9.0 8.5* 8.6*   GFR: Estimated Creatinine Clearance: 45.2 mL/min (by C-G formula based on SCr of 0.91 mg/dL). Liver Function Tests: Recent Labs  Lab 07/17/22 1000 07/19/22 1350  AST 15 19  ALT 8 11  ALKPHOS 92 66  BILITOT 0.4 1.1  PROT 5.8* 6.1*  ALBUMIN 3.7 3.2*   Recent Labs  Lab 07/19/22 1350  LIPASE 24   No results for input(s): "AMMONIA" in the last 168 hours. Coagulation Profile: No results for input(s): "INR", "PROTIME" in the last 168 hours. Cardiac Enzymes: No results for input(s): "CKTOTAL", "CKMB", "CKMBINDEX", "TROPONINI" in the last 168 hours. BNP (last 3 results) No results for input(s): "PROBNP" in the last 8760 hours. HbA1C: No results for input(s): "HGBA1C" in the last 72 hours.  CBG: Recent Labs  Lab 07/19/22 2301  GLUCAP 85   Lipid Profile: No results for input(s): "CHOL", "HDL", "LDLCALC", "TRIG", "CHOLHDL", "LDLDIRECT" in the last 72 hours.  Thyroid Function Tests: No results for input(s): "TSH", "T4TOTAL", "FREET4", "T3FREE", "THYROIDAB" in the last 72 hours.  Anemia Panel: No results for input(s): "VITAMINB12", "FOLATE", "FERRITIN", "TIBC", "IRON", "RETICCTPCT" in the last 72 hours. Sepsis Labs: Recent Labs  Lab 07/19/22 1350 07/19/22 1550  LATICACIDVEN 1.0 1.5    Recent Results (from the past 240 hour(s))  Rapid Strep Screen ([facility] ONLY)     Status: None   Collection Time: 07/11/22  3:14 PM   Specimen: Other    Other  Result Value Ref Range Status   Strep Gp A Ag, IA W/Reflex Negative Negative Final  Culture, Group A Strep     Status: None   Collection Time: 07/11/22  3:14 PM   Other  Result Value Ref Range Status   Strep A Culture CANCELED      Comment: Test not performed  Result canceled by the ancillary.   Blood culture (routine x 2)     Status: None (Preliminary result)   Collection Time: 07/19/22  1:50 PM   Specimen: BLOOD RIGHT FOREARM  Result Value Ref Range Status   Specimen Description   Final    BLOOD RIGHT FOREARM BOTTLES DRAWN AEROBIC AND ANAEROBIC   Special Requests Blood Culture adequate volume  Final   Culture   Final    NO GROWTH < 12 HOURS Performed at [facility], [address] [place], [place] [address]    Report Status PENDING  Incomplete  Blood culture (routine x 2)     Status: None (Preliminary result)   Collection Time: 07/19/22  1:50 PM   Specimen: Left Antecubital; Blood  Result Value Ref Range Status   Specimen Description   Final    LEFT ANTECUBITAL BOTTLES DRAWN AEROBIC AND ANAEROBIC   Special Requests Blood Culture adequate volume  Final   Culture   Final    NO GROWTH < 12 HOURS Performed at [facility], [address] [place], [place] [address]    Report Status PENDING  Incomplete  Resp panel by RT-PCR (RSV, Flu A&B, Covid) Anterior Nasal Swab     Status: None   Collection Time: 07/19/22  2:00 PM   Specimen: Anterior Nasal Swab  Result Value Ref Range Status   SARS Coronavirus 2 by RT PCR NEGATIVE NEGATIVE Final    Comment: (NOTE) SARS-CoV-2 target nucleic acids are NOT DETECTED.  The SARS-CoV-2 RNA is generally detectable in upper respiratory specimens during the acute phase of infection. The lowest concentration of SARS-CoV-2 viral copies this assay can detect is 138 copies/mL. A negative result does not preclude SARS-Cov-2 infection and should not be used as the sole basis for treatment or other patient management decisions. A negative  result may occur with  improper specimen collection/handling, submission of specimen other than nasopharyngeal swab, presence of viral mutation(s) within the areas targeted by this assay, and inadequate number of viral copies(<138 copies/mL). A negative result must be combined with clinical observations, patient history, and epidemiological information. The expected result is Negative.  Fact Sheet for Patients:  BloggerCourse.com  Fact Sheet for Healthcare Providers:  SeriousBroker.it  This test is no t yet approved or cleared by the [place] FDA and  has been authorized for detection and/or diagnosis of SARS-CoV-2 by FDA under an Emergency Use Authorization (EUA). This EUA will remain  in effect (meaning this test can be used) for the duration of the COVID-19 declaration under Section 564(b)(1) of the Act, 21 U.S.C.section 360bbb-3(b)(1), unless the authorization is terminated  or revoked sooner.       Influenza A by PCR NEGATIVE NEGATIVE Final   Influenza B by PCR NEGATIVE NEGATIVE Final    Comment: (NOTE) The Xpert Xpress SARS-CoV-2/FLU/RSV plus assay is intended as an aid in the diagnosis of influenza from Nasopharyngeal swab specimens and should not be used as a sole basis for treatment. Nasal washings and aspirates are unacceptable for Xpert Xpress SARS-CoV-2/FLU/RSV testing.  Fact Sheet for Patients: BloggerCourse.com  Fact Sheet for Healthcare Providers: SeriousBroker.it  This test is not yet approved or cleared by the [place] FDA and has been authorized for detection and/or diagnosis of SARS-CoV-2 by FDA under an Emergency Use Authorization (EUA). This EUA will remain in effect (meaning this test can be used) for the duration of the COVID-19 declaration under Section 564(b)(1) of the Act, 21 U.S.C. section 360bbb-3(b)(1), unless the authorization is  terminated or revoked.     Resp Syncytial Virus by PCR NEGATIVE NEGATIVE Final    Comment: (NOTE) Fact Sheet for Patients: BloggerCourse.com  Fact Sheet for Healthcare Providers: SeriousBroker.it  This test is not yet approved or cleared by the [place] FDA and has been authorized for detection and/or diagnosis of SARS-CoV-2 by FDA under an Emergency Use Authorization (EUA). This EUA  will remain in effect (meaning this test can be used) for the duration of the COVID-19 declaration under Section 564(b)(1) of the Act, 21 U.S.C. section 360bbb-3(b)(1), unless the authorization is terminated or revoked.  Performed at [facility], [address], [place], [place] [address]          Radiology Studies: CT Angio Chest PE W and/or Wo Contrast  Result Date: 07/19/2022 CLINICAL DATA:  Pulmonary embolism (PE) suspected, high prob Cough, fever, flu like symptoms. EXAM: CT ANGIOGRAPHY CHEST WITH CONTRAST TECHNIQUE: Multidetector CT imaging of the chest was performed using the standard protocol during bolus administration of intravenous contrast. Multiplanar CT image reconstructions and MIPs were obtained to evaluate the vascular anatomy. RADIATION DOSE REDUCTION: This exam was performed according to the departmental dose-optimization program which includes automated exposure control, adjustment of the mA and/or kV according to patient size and/or use of iterative reconstruction technique. CONTRAST:  134mL OMNIPAQUE IOHEXOL 350 MG/ML SOLN COMPARISON:  Radiograph earlier today. FINDINGS: Cardiovascular: There are no filling defects within the pulmonary arteries to suggest pulmonary embolus. Moderate aortic atherosclerosis and tortuosity. No acute aortic findings. Normal heart size with coronary artery calcifications. Trace pericardial effusion. Mediastinum/Nodes: Large hiatal hernia with the majority of the stomach being intrathoracic. No abnormal  gastric distension. No mediastinal or hilar adenopathy. Lungs/Pleura: Breathing motion artifact. Faint tree-in-bud and ground-glass nodularity within the right upper lobe. There is moderate diffuse bronchial thickening. Small bilateral pleural effusions, right greater than left. Areas of septal thickening may represent pulmonary edema. No pulmonary mass. No endobronchial lesion. Upper Abdomen: Assessed on concurrent abdominal CT, reported separately. Musculoskeletal: There are no acute or suspicious osseous abnormalities. Thoracic spondylosis with bilateral shoulder osteoarthritis. No chest wall soft tissue abnormalities. Review of the MIP images confirms the above findings. IMPRESSION: 1. No pulmonary embolus. 2. Faint tree-in-bud and ground-glass nodularity within the right upper lobe, likely infectious or inflammatory. 3. Small bilateral pleural effusions, right greater than left. Areas of septal thickening may represent pulmonary edema. 4. Large hiatal hernia with the majority of the stomach being intrathoracic. 5. Aortic atherosclerosis.  Coronary artery calcifications. Aortic Atherosclerosis (ICD10-I70.0). Electronically Signed   By: Keith Rake M.D.   On: 07/19/2022 17:19   CT ABDOMEN PELVIS W CONTRAST  Result Date: 07/19/2022 CLINICAL DATA:  Acute generalized abdominal pain. EXAM: CT ABDOMEN AND PELVIS WITH CONTRAST TECHNIQUE: Multidetector CT imaging of the abdomen and pelvis was performed using the standard protocol following bolus administration of intravenous contrast. RADIATION DOSE REDUCTION: This exam was performed according to the departmental dose-optimization program which includes automated exposure control, adjustment of the mA and/or kV according to patient size and/or use of iterative reconstruction technique. CONTRAST:  152mL OMNIPAQUE IOHEXOL 350 MG/ML SOLN COMPARISON:  May 06, 2021. FINDINGS: Lower chest: Minimal bilateral pleural effusions are noted with minimal adjacent  subsegmental atelectasis. Hepatobiliary: No focal liver abnormality is seen. Status post cholecystectomy. No biliary dilatation. Pancreas: Unremarkable. No pancreatic ductal dilatation or surrounding inflammatory changes. Spleen: Normal in size without focal abnormality. Adrenals/Urinary Tract: Adrenal glands appear normal. Stable right renal cyst is noted for which no further follow-up is required. No hydronephrosis or renal obstruction is noted. Urinary bladder is unremarkable. Stomach/Bowel: Moderate size sliding-type hiatal hernia. There is no evidence of bowel obstruction or inflammation. The appendix is not well visualized. Vascular/Lymphatic: Aortic atherosclerosis. No enlarged abdominal or pelvic lymph nodes. Reproductive: Stable mild prostatic enlargement. Other: No abdominal wall hernia or abnormality. No abdominopelvic ascites. Musculoskeletal: No acute or significant osseous findings. IMPRESSION:  Minimal bilateral pleural effusions are noted with minimal adjacent subsegmental atelectasis. Moderate size sliding-type hiatal hernia. Stable mild prostatic enlargement. No acute abnormality seen in the abdomen or pelvis. Aortic Atherosclerosis (ICD10-I70.0). Electronically Signed   By: Marijo Conception M.D.   On: 07/19/2022 17:16   DG Chest Portable 1 View  Result Date: 07/19/2022 CLINICAL DATA:  Cough and fever EXAM: PORTABLE CHEST 1 VIEW COMPARISON:  May 10, 2022 FINDINGS: Hiatal hernia. The heart, hila, mediastinum, lungs, pleura otherwise unremarkable. IMPRESSION: No active disease. Electronically Signed   By: Dorise Bullion III M.D.   On: 07/19/2022 14:30        Scheduled Meds:  Chlorhexidine Gluconate Cloth  6 each Topical Daily   clopidogrel  75 mg Oral Daily   enoxaparin (LOVENOX) injection  40 mg Subcutaneous Q24H   famotidine  20 mg Oral BID   latanoprost  1 drop Both Eyes QHS   Continuous Infusions:  azithromycin Stopped (07/19/22 2054)   cefTRIAXone (ROCEPHIN)  IV Stopped  (07/20/22 0018)     LOS: 1 day    Time spent: 35 minutes    Americus Scheurich Darleen Crocker, DO [facility]  If 7PM-7AM, please contact night-coverage [email or web address] 07/20/2022, 10:59 AM

## 2022-07-20 NOTE — Progress Notes (Signed)
  Transition of Care (TOC) Screening Note   Patient Details  Name: Paul Bradshaw Date of Birth: 1925/06/26   Transition of Care [facility]) CM/SW Contact:    Iona Beard, [place] Phone Number: 07/20/2022, 10:21 AM    Transition of Care Department [facility]) has reviewed patient and no TOC needs have been identified at this time. We will continue to monitor patient advancement through interdisciplinary progression rounds. If new patient transition needs arise, please place a TOC consult.

## 2022-07-21 DIAGNOSIS — A419 Sepsis, unspecified organism: Secondary | ICD-10-CM | POA: Diagnosis not present

## 2022-07-21 DIAGNOSIS — J189 Pneumonia, unspecified organism: Secondary | ICD-10-CM | POA: Diagnosis not present

## 2022-07-21 LAB — CBC
HCT: 35 % — ABNORMAL LOW (ref 39.0–52.0)
Hemoglobin: 11 g/dL — ABNORMAL LOW (ref 13.0–17.0)
MCH: 30 pg (ref 26.0–34.0)
MCHC: 31.4 g/dL (ref 30.0–36.0)
MCV: 95.4 fL (ref 80.0–100.0)
Platelets: 212 10*3/uL (ref 150–400)
RBC: 3.67 MIL/uL — ABNORMAL LOW (ref 4.22–5.81)
RDW: 14.4 % (ref 11.5–15.5)
WBC: 6.5 10*3/uL (ref 4.0–10.5)
nRBC: 0 % (ref 0.0–0.2)

## 2022-07-21 LAB — BASIC METABOLIC PANEL
Anion gap: 9 (ref 5–15)
BUN: 14 mg/dL (ref 8–23)
CO2: 23 mmol/L (ref 22–32)
Calcium: 8.5 mg/dL — ABNORMAL LOW (ref 8.9–10.3)
Chloride: 105 mmol/L (ref 98–111)
Creatinine, Ser: 0.93 mg/dL (ref 0.61–1.24)
GFR, Estimated: >60 mL/min (ref >60)
Glucose, Bld: 88 mg/dL (ref 70–99)
Potassium: 3.8 mmol/L (ref 3.5–5.1)
Sodium: 137 mmol/L (ref 135–145)

## 2022-07-21 LAB — MAGNESIUM: Magnesium: 1.9 mg/dL (ref 1.7–2.4)

## 2022-07-21 MED ORDER — AMOXICILLIN-POT CLAVULANATE 500-125 MG PO TABS: 1.0000 | ORAL_TABLET | Freq: Three times a day (TID) | ORAL | 0 refills | Status: AC

## 2022-07-21 NOTE — Evaluation (Signed)
Physical Therapy Evaluation Patient Details Name: Paul Bradshaw MRN: [id number] DOB: 11/23/1925 Today's Date: 07/21/2022  History of Present Illness  Paul Bradshaw is a 89 y.o. 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  Patient presents with fatigue and weakness over the past day.  He has been having fevers, cough, shortness of breath over the past 2 days.  He was brought here by EMS and was found to be febrile initially.  Due to the patient's aphasia, he is not able to contribute to the history taking.  His blood pressure was initially soft.  He received fluid bolus of 30 mL/kg.  Additionally, he was started on broad-spectrum antibiotics for sepsis.   Clinical Impression  Patient functioning near baseline for functional mobility and gait demonstrating good return for ambulating in room and hallway using his Hemi-walker without loss of balance.  PLAN:  Patient to be discharged home today and discharged from acute physical therapy to care of nursing for ambulation as tolerated for length of stay with recommendations stated below         Recommendations for follow up therapy are one component of a multi-disciplinary discharge planning process, led by the attending physician.  Recommendations may be updated based on patient status, additional functional criteria and insurance authorization.  Follow Up Recommendations Home health PT      Assistance Recommended at Discharge Set up Supervision/Assistance  Patient can return home with the following  A little help with walking and/or transfers;A little help with bathing/dressing/bathroom;Help with stairs or ramp for entrance;Assistance with cooking/housework    Equipment Recommendations None recommended by PT  Recommendations for Other Services       Functional Status Assessment Patient has had a recent decline in their functional  status and demonstrates the ability to make significant improvements in function in a reasonable and predictable amount of time.     Precautions / Restrictions Precautions Precautions: Fall Restrictions Weight Bearing Restrictions: No      Mobility  Bed Mobility Overal bed mobility: Needs Assistance Bed Mobility: Supine to Sit     Supine to sit: Min assist     General bed mobility comments: increased time, labored movement    Transfers Overall transfer level: Needs assistance Equipment used: Hemi-walker Transfers: Sit to/from Stand, Bed to chair/wheelchair/BSC Sit to Stand: Supervision   Step pivot transfers: Supervision       General transfer comment: good return for transferring to/from chair, bed side    Ambulation/Gait Ambulation/Gait assistance: Supervision Gait Distance (Feet): 50 Feet Assistive device: Hemi-walker Gait Pattern/deviations: Step-to pattern, Decreased step length - left, Decreased stride length, Decreased step length - right, Trunk flexed Gait velocity: decreased     General Gait Details: slightly labored cadence with mostly step-to pattern without loss of balance, limited mostly due to fatigue  Stairs            Wheelchair Mobility    Modified Rankin (Stroke Patients Only)       Balance Overall balance assessment: Needs assistance Sitting-balance support: Feet supported, No upper extremity supported Sitting balance-Leahy Scale: Good Sitting balance - Comments: seated at EOB   Standing balance support: During functional activity, Single extremity supported Standing balance-Leahy Scale: Fair Standing balance comment: fair/good using Hemi-walker                             Pertinent Vitals/Pain Pain Assessment Pain  Assessment: No/denies pain    Home Living Family/patient expects to be discharged to:: Private residence Living Arrangements: Other relatives Available Help at Discharge: Family;Available 24  hours/day Type of Home: House Home Access: Stairs to enter;Ramped entrance Entrance Stairs-Rails: Right Entrance Stairs-Number of Steps: 6   Home Layout: One level Home Equipment: Cane - single point;Shower Land (2 wheels);Wheelchair - Education administrator (comment) Additional Comments: Hemi-walker    Prior Function Prior Level of Function : Needs assist       Physical Assist : Mobility (physical);ADLs (physical) Mobility (physical): Bed mobility;Transfers;Gait;Stairs   Mobility Comments: Supervised household ambulation using Hem-walker ADLs Comments: Assisted by family     Hand Dominance   Dominant Hand: Right    Extremity/Trunk Assessment   Upper Extremity Assessment Upper Extremity Assessment: RUE deficits/detail RUE Deficits / Details: RUE grossly 0/5 due to old CVA RUE: Unable to fully assess due to immobilization RUE Sensation: decreased proprioception RUE Coordination: decreased fine motor;decreased gross motor    Lower Extremity Assessment Lower Extremity Assessment: Generalized weakness    Cervical / Trunk Assessment Cervical / Trunk Assessment: Kyphotic  Communication   Communication: HOH;Expressive difficulties  Cognition Arousal/Alertness: Awake/alert Behavior During Therapy: WFL for tasks assessed/performed Overall Cognitive Status: Within Functional Limits for tasks assessed                                          General Comments      Exercises     Assessment/Plan    PT Assessment All further PT needs can be met in the next venue of care  PT Problem List Decreased strength;Decreased activity tolerance;Decreased balance;Decreased mobility       PT Treatment Interventions      PT Goals (Current goals can be found in the Care Plan section)  Acute Rehab PT Goals Patient Stated Goal: return home with family to assist PT Goal Formulation: With patient/family Time For Goal Achievement: 07/21/22 Potential to Achieve  Goals: Good    Frequency       Co-evaluation               AM-PAC PT "6 Clicks" Mobility  Outcome Measure Help needed turning from your back to your side while in a flat bed without using bedrails?: A Little Help needed moving from lying on your back to sitting on the side of a flat bed without using bedrails?: A Little Help needed moving to and from a bed to a chair (including a wheelchair)?: None Help needed standing up from a chair using your arms (e.g., wheelchair or bedside chair)?: None Help needed to walk in hospital room?: A Little Help needed climbing 3-5 steps with a railing? : A Little 6 Click Score: 20    End of Session   Activity Tolerance: Patient tolerated treatment well;Patient limited by fatigue Patient left: in chair;with call bell/phone within reach;with family/visitor present Nurse Communication: Mobility status PT Visit Diagnosis: Unsteadiness on feet (R26.81);Other abnormalities of gait and mobility (R26.89);Muscle weakness (generalized) (M62.81)    Time: 9211-9417 PT Time Calculation (min) (ACUTE ONLY): 21 min   Charges:   PT Evaluation $PT Eval Moderate Complexity: 1 Mod PT Treatments $Therapeutic Activity: 8-22 mins        12:35 PM, 07/21/22 Lonell Grandchild, MPT Physical Therapist with [facility] [phone] office [phone] mobile phone

## 2022-07-21 NOTE — Care Management Important Message (Signed)
Important Message  Patient Details  Name: Paul Bradshaw MRN: [id number] Date of Birth: 1925/10/09   Medicare Important Message Given:  N/A - LOS <3 / Initial given by admissions     Paul Bradshaw 07/21/2022, 12:58 PM

## 2022-07-21 NOTE — Telephone Encounter (Signed)
TC to [facility], giving this information.  Baruch Gouty, FNP  You3 days ago   Metoprolol is only once daily, he should be on the atorvastatin. If they do not want him to take, we can discontinue this.

## 2022-07-21 NOTE — TOC Transition Note (Addendum)
Transition of Care [facility]) - CM/SW Discharge Note   Patient Details  Name: Paul Bradshaw MRN: [id number] Date of Birth: 07/27/1925  Transition of Care [facility]) CM/SW Contact:  Boneta Lucks, RN Phone Number: 07/21/2022, 12:18 PM   Clinical Narrative:   Patient discharging home, PT is recommending HHPT. Sarah with [facility] will send in to try to get them to accepted for HHPT. TOC will follow.   Addendum : Patient is active with [facility], updated Marjory Lies with new orders.   Final next level of care: Home w Home Health Services Barriers to Discharge: Barriers Resolved   Patient Goals and CMS Choice CMS Medicare.gov Compare Post Acute Care list provided to:: Patient Choice offered to / list presented to : Patient  Discharge Placement    Patient and family notified of of transfer: 07/21/22  Discharge Plan and Services Additional resources added to the After Visit Summary for      [facility] Arranged: PT   Date [facility] Agency Contacted: 07/21/22 Time [place]: 1217 Representative spoke with at [place]: Judson Roch Loss adjuster, chartered)  Social Determinants of Health ([place]) Interventions [place]: No Food Insecurity (05/10/2022)  Housing: Low Risk  (05/10/2022)  Transportation Needs: No Transportation Needs (05/10/2022)  Utilities: Not At Risk (05/10/2022)  Depression (PHQ2-9): Low Risk  (10/09/2021)  Tobacco Use: Low Risk  (07/19/2022)    Readmission Risk Interventions    07/21/2022   12:18 PM  Readmission Risk Prevention Plan  Transportation Screening Complete  PCP or Specialist Appt within 5-7 Days Not Complete  Home Care Screening Complete  Medication Review (RN CM) Complete

## 2022-07-21 NOTE — Discharge Summary (Signed)
Physician Discharge Summary  PASTOR SGRO HGD:[id number] DOB: [birth year] DOA: 07/19/2022  PCP: Sonny Masters, FNP  Admit date: 07/19/2022  Discharge date: 07/21/2022  Admitted From:Home  Disposition:  Home  Recommendations for Outpatient Follow-up:  Follow up with PCP in 1-2 weeks Continue on Augmentin as prescribed for 3 more days to complete total 5-day course of treatment for pneumonia Continue on other home medications as prior  Home Health: Yes with PT  Equipment/Devices: None  Discharge Condition:Stable  CODE STATUS: DNR  Diet recommendation: Heart Healthy  Brief/Interim Summary:  KINTE TRIM is a 89 y.o. 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  Patient presents with fatigue and weakness over the past day.  He has been having fevers, cough, shortness of breath over the past 2 days.  Patient was admitted for sepsis, present on admission secondary to community-acquired pneumonia and has been started on empiric Rocephin and azithromycin.  He is having appropriate oral intake with no further fever noted and improvement on lab work.  He is in stable condition for discharge today with home physical therapy services.  No other acute events or concerns noted and he will remain on antibiotics as noted above for 3 more days.  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  Discharge Instructions  Discharge Instructions     Diet - low sodium heart healthy   Complete by: As directed    Increase activity slowly   Complete by: As directed       Allergies as of 07/21/2022       Reactions   Amlodipine Swelling    Swelling of lips.   Lisinopril Other (See Comments)   Elevated BP higher & causes a burning feeling on the inside.         Medication List     TAKE these medications    amoxicillin-clavulanate 500-125 MG tablet Commonly known as: Augmentin Take 1 tablet by mouth 3 (three) times daily for 3 days.   cetirizine 5 MG tablet Commonly known as: ZYRTEC Take 1 tablet (5 mg total) by mouth daily.   clopidogrel 75 MG tablet Commonly known as: PLAVIX TAKE ONE TABLET EVERY DAY WITH BREAKFAST What changed: See the new instructions.   famotidine 20 MG tablet Commonly known as: PEPCID TAKE ONE TABLET TWICE DAILY   latanoprost 0.005 % ophthalmic solution Commonly known as: XALATAN Place 1 drop into both eyes at bedtime.   metoprolol tartrate 25 MG tablet Commonly known as: LOPRESSOR TAKE 1/2 TABLET TWICE DAILY        Follow-up Information     Sonny Masters, FNP. Schedule an appointment as soon as possible for a visit in 1 week(s).   Specialty: Family Medicine Contact information: [address] [place] [address] [phone]                Allergies  Allergen Reactions   Amlodipine Swelling    Swelling of lips.   Lisinopril Other (See Comments)    Elevated BP higher & causes a burning feeling on the inside.     Consultations: None   Procedures/Studies: CT Angio Chest PE W and/or Wo Contrast  Result Date: 07/19/2022 CLINICAL DATA:  Pulmonary embolism (  PE) suspected, high prob Cough, fever, flu like symptoms. EXAM: CT ANGIOGRAPHY CHEST WITH CONTRAST TECHNIQUE: Multidetector CT imaging of the chest was performed using the standard protocol during bolus administration of intravenous contrast. Multiplanar CT image reconstructions and MIPs were obtained to evaluate the vascular anatomy. RADIATION DOSE REDUCTION: This exam was performed according to the departmental dose-optimization program which includes automated exposure control, adjustment of the mA  and/or kV according to patient size and/or use of iterative reconstruction technique. CONTRAST:  OMNIPAQUE IOHEXOL 350 MG/ML SOLN COMPARISON:  Radiograph earlier today. FINDINGS: Cardiovascular: There are no filling defects within the pulmonary arteries to suggest pulmonary embolus. Moderate aortic atherosclerosis and tortuosity. No acute aortic findings. Normal heart size with coronary artery calcifications. Trace pericardial effusion. Mediastinum/Nodes: Large hiatal hernia with the majority of the stomach being intrathoracic. No abnormal gastric distension. No mediastinal or hilar adenopathy. Lungs/Pleura: Breathing motion artifact. Faint tree-in-bud and ground-glass nodularity within the right upper lobe. There is moderate diffuse bronchial thickening. Small bilateral pleural effusions, right greater than left. Areas of septal thickening may represent pulmonary edema. No pulmonary mass. No endobronchial lesion. Upper Abdomen: Assessed on concurrent abdominal CT, reported separately. Musculoskeletal: There are no acute or suspicious osseous abnormalities. Thoracic spondylosis with bilateral shoulder osteoarthritis. No chest wall soft tissue abnormalities. Review of the MIP images confirms the above findings. IMPRESSION: 1. No pulmonary embolus. 2. Faint tree-in-bud and ground-glass nodularity within the right upper lobe, likely infectious or inflammatory. 3. Small bilateral pleural effusions, right greater than left. Areas of septal thickening may represent pulmonary edema. 4. Large hiatal hernia with the majority of the stomach being intrathoracic. 5. Aortic atherosclerosis.  Coronary artery calcifications. Aortic Atherosclerosis (ICD10-I70.0). Electronically Signed   By: Narda Rutherford M.D.   On: 07/19/2022 17:19   CT ABDOMEN PELVIS W CONTRAST  Result Date: 07/19/2022 CLINICAL DATA:  Acute generalized abdominal pain. EXAM: CT ABDOMEN AND PELVIS WITH CONTRAST TECHNIQUE: Multidetector CT imaging of the  abdomen and pelvis was performed using the standard protocol following bolus administration of intravenous contrast. RADIATION DOSE REDUCTION: This exam was performed according to the departmental dose-optimization program which includes automated exposure control, adjustment of the mA and/or kV according to patient size and/or use of iterative reconstruction technique. CONTRAST:  OMNIPAQUE IOHEXOL 350 MG/ML SOLN COMPARISON:  May 06, 2021. FINDINGS: Lower chest: Minimal bilateral pleural effusions are noted with minimal adjacent subsegmental atelectasis. Hepatobiliary: No focal liver abnormality is seen. Status post cholecystectomy. No biliary dilatation. Pancreas: Unremarkable. No pancreatic ductal dilatation or surrounding inflammatory changes. Spleen: Normal in size without focal abnormality. Adrenals/Urinary Tract: Adrenal glands appear normal. Stable right renal cyst is noted for which no further follow-up is required. No hydronephrosis or renal obstruction is noted. Urinary bladder is unremarkable. Stomach/Bowel: Moderate size sliding-type hiatal hernia. There is no evidence of bowel obstruction or inflammation. The appendix is not well visualized. Vascular/Lymphatic: Aortic atherosclerosis. No enlarged abdominal or pelvic lymph nodes. Reproductive: Stable mild prostatic enlargement. Other: No abdominal wall hernia or abnormality. No abdominopelvic ascites. Musculoskeletal: No acute or significant osseous findings. IMPRESSION: Minimal bilateral pleural effusions are noted with minimal adjacent subsegmental atelectasis. Moderate size sliding-type hiatal hernia. Stable mild prostatic enlargement. No acute abnormality seen in the abdomen or pelvis. Aortic Atherosclerosis (ICD10-I70.0). Electronically Signed   By: Lupita Raider M.D.   On: 07/19/2022 17:16   DG Chest Portable 1 View  Result Date: 07/19/2022 CLINICAL DATA:  Cough and fever EXAM: PORTABLE CHEST 1 VIEW COMPARISON:  May 10, 2022  FINDINGS: Hiatal hernia. The heart, hila, mediastinum, lungs, pleura otherwise unremarkable. IMPRESSION: No active disease. Electronically Signed   By: Gerome Sam III M.D.   On: 07/19/2022 14:30     Discharge Exam: Vitals:   07/21/22 0403 07/21/22 0834  BP: 117/69 104/67  Pulse: 79 73  Resp: 18 18  Temp: (!) 97.5 F (36.4 C) 98.4 F (36.9 C)  SpO2: 98% 99%   Vitals:   07/20/22 1645 07/20/22 2029 07/21/22 0403 07/21/22 0834  BP: (!) 107/46 133/63 117/69 104/67  Pulse: 66 70 79 73  Resp:  18 18 18   Temp:  98 F (36.7 C) (!) 97.5 F (36.4 C) 98.4 F (36.9 C)  TempSrc:    Oral  SpO2: 95% 97% 98% 99%  Weight:      Height:        General: Pt is alert, awake, not in acute distress Cardiovascular: RRR, S1/S2 +, no rubs, no gallops Respiratory: CTA bilaterally, no wheezing, no rhonchi Abdominal: Soft, NT, ND, bowel sounds + Extremities: no edema, no cyanosis    The results of significant diagnostics from this hospitalization (including imaging, microbiology, ancillary and laboratory) are listed below for reference.     Microbiology: Recent Results (from the past 240 hour(s))  Rapid Strep Screen ([facility] ONLY)     Status: None   Collection Time: 07/11/22  3:14 PM   Specimen: Other   Other  Result Value Ref Range Status   Strep Gp A Ag, IA W/Reflex Negative Negative Final  Culture, Group A Strep     Status: None   Collection Time: 07/11/22  3:14 PM   Other  Result Value Ref Range Status   Strep A Culture CANCELED      Comment: Test not performed  Result canceled by the ancillary.   Blood culture (routine x 2)     Status: None (Preliminary result)   Collection Time: 07/19/22  1:50 PM   Specimen: BLOOD RIGHT FOREARM  Result Value Ref Range Status   Specimen Description   Final    BLOOD RIGHT FOREARM BOTTLES DRAWN AEROBIC AND ANAEROBIC   Special Requests Blood Culture adequate volume  Final   Culture   Final    NO GROWTH 2 DAYS Performed at [facility], [address], [place], [place]    Report Status PENDING  Incomplete  Blood culture (routine x 2)     Status: None (Preliminary result)   Collection Time: 07/19/22  1:50 PM   Specimen: Left Antecubital; Blood  Result Value Ref Range Status   Specimen Description   Final    LEFT ANTECUBITAL BOTTLES DRAWN AEROBIC AND ANAEROBIC   Special Requests Blood Culture adequate volume  Final   Culture   Final    NO GROWTH 2 DAYS Performed at [facility], [address], [place], [place]    Report Status PENDING  Incomplete  Resp panel by RT-PCR (RSV, Flu A&B, Covid) Anterior Nasal Swab     Status: None   Collection Time: 07/19/22  2:00 PM   Specimen: Anterior Nasal Swab  Result Value Ref Range Status   SARS Coronavirus 2 by RT PCR NEGATIVE NEGATIVE Final    Comment: (NOTE) SARS-CoV-2 target nucleic acids are NOT DETECTED.  The SARS-CoV-2 RNA is generally detectable in upper respiratory specimens during the acute phase of infection. The lowest concentration of SARS-CoV-2 viral copies this assay can detect is 138 copies/mL. A negative result does not preclude SARS-Cov-2 infection and  should not be used as the sole basis for treatment or other patient management decisions. A negative result may occur with  improper specimen collection/handling, submission of specimen other than nasopharyngeal swab, presence of viral mutation(s) within the areas targeted by this assay, and inadequate number of viral copies(<138 copies/mL). A negative result must be combined with clinical observations, patient history, and epidemiological information. The expected result is Negative.  Fact Sheet for Patients:  EntrepreneurPulse.com.au  Fact Sheet for Healthcare Providers:  IncredibleEmployment.be  This test is no t yet approved or cleared by the [place] FDA and  has been authorized for detection and/or diagnosis of SARS-CoV-2 by FDA under an  Emergency Use Authorization (EUA). This EUA will remain  in effect (meaning this test can be used) for the duration of the COVID-19 declaration under Section 564(b)(1) of the Act, 21 U.S.C.section 360bbb-3(b)(1), unless the authorization is terminated  or revoked sooner.       Influenza A by PCR NEGATIVE NEGATIVE Final   Influenza B by PCR NEGATIVE NEGATIVE Final    Comment: (NOTE) The Xpert Xpress SARS-CoV-2/FLU/RSV plus assay is intended as an aid in the diagnosis of influenza from Nasopharyngeal swab specimens and should not be used as a sole basis for treatment. Nasal washings and aspirates are unacceptable for Xpert Xpress SARS-CoV-2/FLU/RSV testing.  Fact Sheet for Patients: EntrepreneurPulse.com.au  Fact Sheet for Healthcare Providers: IncredibleEmployment.be  This test is not yet approved or cleared by the [place] FDA and has been authorized for detection and/or diagnosis of SARS-CoV-2 by FDA under an Emergency Use Authorization (EUA). This EUA will remain in effect (meaning this test can be used) for the duration of the COVID-19 declaration under Section 564(b)(1) of the Act, 21 U.S.C. section 360bbb-3(b)(1), unless the authorization is terminated or revoked.     Resp Syncytial Virus by PCR NEGATIVE NEGATIVE Final    Comment: (NOTE) Fact Sheet for Patients: EntrepreneurPulse.com.au  Fact Sheet for Healthcare Providers: IncredibleEmployment.be  This test is not yet approved or cleared by the [place] FDA and has been authorized for detection and/or diagnosis of SARS-CoV-2 by FDA under an Emergency Use Authorization (EUA). This EUA will remain in effect (meaning this test can be used) for the duration of the COVID-19 declaration under Section 564(b)(1) of the Act, 21 U.S.C. section 360bbb-3(b)(1), unless the authorization is terminated or revoked.  Performed at [facility],  [address], [place], [place] [address]   MRSA Next Gen by PCR, Nasal     Status: Abnormal   Collection Time: 07/19/22 10:56 PM   Specimen: Nasal Mucosa; Nasal Swab  Result Value Ref Range Status   MRSA by PCR Next Gen DETECTED (A) NOT DETECTED Final    Comment: CRITICAL RESULT CALLED TO, READ BACK BY AND VERIFIED WITH: J. MUSE @ 0017 BTY STEPHTR 07/20/22 (NOTE) The GeneXpert MRSA Assay (FDA approved for NASAL specimens only), is one component of a comprehensive MRSA colonization surveillance program. It is not intended to diagnose MRSA infection nor to guide or monitor treatment for MRSA infections. Test performance is not FDA approved in patients less than 35 years old. Performed at [facility], [address], [place], [place] [address]      Labs: BNP (last 3 results) No results for input(s): "BNP" in the last 8760 hours. Basic Metabolic Panel: Recent Labs  Lab 07/17/22 1000 07/19/22 1350 07/20/22 0548 07/21/22 0500  NA 140 133* 135 137  K 4.3 4.0 3.3* 3.8  CL 101 100 100 105  CO2 23 24 24 23   GLUCOSE 112* 101* 95 88  BUN 16 16 13 14   CREATININE 1.20 0.89 0.91 0.93  CALCIUM 9.0 8.5* 8.6* 8.5*  MG  --   --   --  1.9   Liver Function Tests: Recent Labs  Lab 07/17/22 1000 07/19/22 1350  AST 15 19  ALT 8 11  ALKPHOS 92 66  BILITOT 0.4 1.1  PROT 5.8* 6.1*  ALBUMIN 3.7 3.2*   Recent Labs  Lab 07/19/22 1350  LIPASE 24   No results for input(s): "AMMONIA" in the last 168 hours. CBC: Recent Labs  Lab 07/17/22 1000 07/19/22 1350 07/20/22 0548 07/21/22 0500  WBC 7.5 10.8* 9.8 6.5  NEUTROABS 5.3 8.9*  --   --   HGB 12.5* 11.4* 11.8* 11.0*  HCT 38.1 35.3* 36.7* 35.0*  MCV 94 94.1 94.8 95.4  PLT 249 218 213 212   Cardiac Enzymes: No results for input(s): "CKTOTAL", "CKMB", "CKMBINDEX", "TROPONINI" in the last 168 hours. BNP: Invalid input(s): "POCBNP" CBG: Recent Labs  Lab 07/19/22 2301  GLUCAP 85   D-Dimer No results for input(s): "DDIMER" in the  last 72 hours. Hgb A1c No results for input(s): "HGBA1C" in the last 72 hours. Lipid Profile No results for input(s): "CHOL", "HDL", "LDLCALC", "TRIG", "CHOLHDL", "LDLDIRECT" in the last 72 hours. Thyroid function studies No results for input(s): "TSH", "T4TOTAL", "T3FREE", "THYROIDAB" in the last 72 hours.  Invalid input(s): "FREET3" Anemia work up No results for input(s): "VITAMINB12", "FOLATE", "FERRITIN", "TIBC", "IRON", "RETICCTPCT" in the last 72 hours. Urinalysis    Component Value Date/Time   COLORURINE YELLOW 07/20/2022 1531   APPEARANCEUR CLEAR 07/20/2022 1531   APPEARANCEUR Cloudy (A) 05/01/2022 0820   LABSPEC 1.014 07/20/2022 1531   PHURINE 6.0 07/20/2022 1531   GLUCOSEU NEGATIVE 07/20/2022 1531   HGBUR LARGE (A) 07/20/2022 1531   BILIRUBINUR NEGATIVE 07/20/2022 1531   BILIRUBINUR Negative 05/01/2022 0820   KETONESUR 20 (A) 07/20/2022 1531   PROTEINUR NEGATIVE 07/20/2022 1531   UROBILINOGEN 2.0 (H) 08/08/2012 1104   NITRITE NEGATIVE 07/20/2022 1531   LEUKOCYTESUR NEGATIVE 07/20/2022 1531   Sepsis Labs Recent Labs  Lab 07/17/22 1000 07/19/22 1350 07/20/22 0548 07/21/22 0500  WBC 7.5 10.8* 9.8 6.5   Microbiology Recent Results (from the past 240 hour(s))  Rapid Strep Screen ([facility] ONLY)     Status: None   Collection Time: 07/11/22  3:14 PM   Specimen: Other   Other  Result Value Ref Range Status   Strep Gp A Ag, IA W/Reflex Negative Negative Final  Culture, Group A Strep     Status: None   Collection Time: 07/11/22  3:14 PM   Other  Result Value Ref Range Status   Strep A Culture CANCELED      Comment: Test not performed  Result canceled by the ancillary.   Blood culture (routine x 2)     Status: None (Preliminary result)   Collection Time: 07/19/22  1:50 PM   Specimen: BLOOD RIGHT FOREARM  Result Value Ref Range Status   Specimen Description   Final    BLOOD RIGHT FOREARM BOTTLES DRAWN AEROBIC AND ANAEROBIC   Special Requests Blood  Culture adequate volume  Final   Culture   Final    NO GROWTH 2 DAYS Performed at [facility], [address] [place], [place] [address]    Report Status PENDING  Incomplete  Blood culture (routine x 2)     Status: None (Preliminary result)  Collection Time: 07/19/22  1:50 PM   Specimen: Left Antecubital; Blood  Result Value Ref Range Status   Specimen Description   Final    LEFT ANTECUBITAL BOTTLES DRAWN AEROBIC AND ANAEROBIC   Special Requests Blood Culture adequate volume  Final   Culture   Final    NO GROWTH 2 DAYS Performed at [facility], [address] [place], [place] [address]    Report Status PENDING  Incomplete  Resp panel by RT-PCR (RSV, Flu A&B, Covid) Anterior Nasal Swab     Status: None   Collection Time: 07/19/22  2:00 PM   Specimen: Anterior Nasal Swab  Result Value Ref Range Status   SARS Coronavirus 2 by RT PCR NEGATIVE NEGATIVE Final    Comment: (NOTE) SARS-CoV-2 target nucleic acids are NOT DETECTED.  The SARS-CoV-2 RNA is generally detectable in upper respiratory specimens during the acute phase of infection. The lowest concentration of SARS-CoV-2 viral copies this assay can detect is 138 copies/mL. A negative result does not preclude SARS-Cov-2 infection and should not be used as the sole basis for treatment or other patient management decisions. A negative result may occur with  improper specimen collection/handling, submission of specimen other than nasopharyngeal swab, presence of viral mutation(s) within the areas targeted by this assay, and inadequate number of viral copies(<138 copies/mL). A negative result must be combined with clinical observations, patient history, and epidemiological information. The expected result is Negative.  Fact Sheet for Patients:  BloggerCourse.comhttps://www.fda.gov/media/152166/download  Fact Sheet for Healthcare Providers:  SeriousBroker.ithttps://www.fda.gov/media/152162/download  This test is no t yet approved or cleared by the [place] FDA and  has been authorized for detection and/or diagnosis of SARS-CoV-2 by FDA under an Emergency Use Authorization (EUA). This EUA will remain  in effect (meaning this test can be used) for the duration of the COVID-19 declaration under Section 564(b)(1) of the Act, 21 U.S.C.section 360bbb-3(b)(1), unless the authorization is terminated  or revoked sooner.       Influenza A by PCR NEGATIVE NEGATIVE Final   Influenza B by PCR NEGATIVE NEGATIVE Final    Comment: (NOTE) The Xpert Xpress SARS-CoV-2/FLU/RSV plus assay is intended as an aid in the diagnosis of influenza from Nasopharyngeal swab specimens and should not be used as a sole basis for treatment. Nasal washings and aspirates are unacceptable for Xpert Xpress SARS-CoV-2/FLU/RSV testing.  Fact Sheet for Patients: BloggerCourse.comhttps://www.fda.gov/media/152166/download  Fact Sheet for Healthcare Providers: SeriousBroker.ithttps://www.fda.gov/media/152162/download  This test is not yet approved or cleared by the [place] FDA and has been authorized for detection and/or diagnosis of SARS-CoV-2 by FDA under an Emergency Use Authorization (EUA). This EUA will remain in effect (meaning this test can be used) for the duration of the COVID-19 declaration under Section 564(b)(1) of the Act, 21 U.S.C. section 360bbb-3(b)(1), unless the authorization is terminated or revoked.     Resp Syncytial Virus by PCR NEGATIVE NEGATIVE Final    Comment: (NOTE) Fact Sheet for Patients: BloggerCourse.comhttps://www.fda.gov/media/152166/download  Fact Sheet for Healthcare Providers: SeriousBroker.ithttps://www.fda.gov/media/152162/download  This test is not yet approved or cleared by the [place] FDA and has been authorized for detection and/or diagnosis of SARS-CoV-2 by FDA under an Emergency Use Authorization (EUA). This EUA will remain in effect (meaning this test can be used) for the duration of the COVID-19 declaration under Section 564(b)(1) of the Act, 21 U.S.C. section  360bbb-3(b)(1), unless the authorization is terminated or revoked.  Performed at [facility], [address] [place], [place] [address]   MRSA Next Gen  by PCR, Nasal     Status: Abnormal   Collection Time: 07/19/22 10:56 PM   Specimen: Nasal Mucosa; Nasal Swab  Result Value Ref Range Status   MRSA by PCR Next Gen DETECTED (A) NOT DETECTED Final    Comment: CRITICAL RESULT CALLED TO, READ BACK BY AND VERIFIED WITH: J. MUSE @ 1252 BTY STEPHTR 07/20/22 (NOTE) The GeneXpert MRSA Assay (FDA approved for NASAL specimens only), is one component of a comprehensive MRSA colonization surveillance program. It is not intended to diagnose MRSA infection nor to guide or monitor treatment for MRSA infections. Test performance is not FDA approved in patients less than 87 years old. Performed at [facility], [address] [place], [place] [address]      Time coordinating discharge: 35 minutes  SIGNED:   Erick BlinksPratik D Mahalia Dykes, DO [facility] 07/21/2022, 12:16 PM  If 7PM-7AM, please contact night-coverage [email or web address]

## 2022-07-22 ENCOUNTER — Encounter: Payer: Self-pay | Admitting: *Deleted

## 2022-07-22 ENCOUNTER — Telehealth: Payer: Self-pay | Admitting: *Deleted

## 2022-07-22 LAB — LEGIONELLA PNEUMOPHILA SEROGP 1 UR AG: L. pneumophila Serogp 1 Ur Ag: NEGATIVE

## 2022-07-22 NOTE — Patient Outreach (Signed)
  Care Coordination TOC Note Transition Care Management Follow-up Telephone Call Date of discharge and from where: [name] on 07/21/22 How have you been since you were released from the hospital? Per son, Paul Bradshaw, "he's better but real congested this morning. He's eating and drinking well." Any questions or concerns? No  Items Reviewed: Did the pt receive and understand the discharge instructions provided? Yes  Medications obtained and verified? Yes 3 days of augmentin to complete 5 day course Other? Yes . Encouraged deep breathing exercises and movement to break up congestion and prevent worsening pneumonia. Reviewed and discussed CT and CXR results. Recommended plain mucinex to thin secretions and to drink plenty of fluids. Monitor for s/s of worsening infection Any new allergies since your discharge? No  Dietary orders reviewed? Yes Do you have support at home? Yes   Home Care and Equipment/Supplies: Were home health services ordered? Yes-currently active. New orders were sent If so, what is the name of the agency? [place]  Has the agency set up a time to come to the patient's home? yes Were any new equipment or medical supplies ordered?  No What is the name of the medical supply agency?  Were you able to get the supplies/equipment? not applicable Do you have any questions related to the use of the equipment or supplies? No  Functional Questionnaire: (I = Independent and D = Dependent) ADLs: I  Bathing/Dressing- I  Meal Prep- D  Eating- D  Maintaining continence- I  Transferring/Ambulation- I  Managing Meds- I  Follow up appointments reviewed:  PCP Hospital f/u appt confirmed? Yes  Scheduled to see Darla Lesches, [place] on 07/29/22 at 11:50 [place] [facility] f/u appt confirmed?  N/A   Are transportation arrangements needed? No  If their condition worsens, is the pt aware to call PCP or go to the Emergency Dept.? Yes Was the patient provided with contact information for the  PCP's office or ED? Yes Was to pt encouraged to call back with questions or concerns? Yes  SDOH assessments and interventions completed:   Yes SDOH Interventions Today    Flowsheet Row Most Recent Value  SDOH Interventions   Housing Interventions Intervention Not Indicated  Transportation Interventions Intervention Not Indicated       Care Coordination Interventions:  Interventions outlined above    Encounter Outcome:  Pt. Visit Completed    Paul Bradshaw, BSN, RN-BC RN Care Coordinator [place]: [phone] Main #: [phone]

## 2022-07-24 ENCOUNTER — Telehealth: Payer: Self-pay | Admitting: Family Medicine

## 2022-07-24 LAB — CULTURE, BLOOD (ROUTINE X 2)
Culture: NO GROWTH
Culture: NO GROWTH
Special Requests: ADEQUATE
Special Requests: ADEQUATE

## 2022-07-24 NOTE — Telephone Encounter (Signed)
[facility] (life sciences)) called to let PCP know that they are admitting patient a day early.

## 2022-07-25 DIAGNOSIS — M5442 Lumbago with sciatica, left side: Secondary | ICD-10-CM | POA: Diagnosis not present

## 2022-07-25 DIAGNOSIS — Z7902 Long term (current) use of antithrombotics/antiplatelets: Secondary | ICD-10-CM | POA: Diagnosis not present

## 2022-07-25 DIAGNOSIS — E782 Mixed hyperlipidemia: Secondary | ICD-10-CM | POA: Diagnosis not present

## 2022-07-25 DIAGNOSIS — E43 Unspecified severe protein-calorie malnutrition: Secondary | ICD-10-CM | POA: Diagnosis not present

## 2022-07-25 DIAGNOSIS — D649 Anemia, unspecified: Secondary | ICD-10-CM | POA: Diagnosis not present

## 2022-07-25 DIAGNOSIS — R7303 Prediabetes: Secondary | ICD-10-CM | POA: Diagnosis not present

## 2022-07-25 DIAGNOSIS — I69320 Aphasia following cerebral infarction: Secondary | ICD-10-CM | POA: Diagnosis not present

## 2022-07-25 DIAGNOSIS — H409 Unspecified glaucoma: Secondary | ICD-10-CM | POA: Diagnosis not present

## 2022-07-25 DIAGNOSIS — Z8673 Personal history of transient ischemic attack (TIA), and cerebral infarction without residual deficits: Secondary | ICD-10-CM | POA: Diagnosis not present

## 2022-07-25 DIAGNOSIS — M48061 Spinal stenosis, lumbar region without neurogenic claudication: Secondary | ICD-10-CM | POA: Diagnosis not present

## 2022-07-25 DIAGNOSIS — Z87442 Personal history of urinary calculi: Secondary | ICD-10-CM | POA: Diagnosis not present

## 2022-07-25 DIAGNOSIS — R1310 Dysphagia, unspecified: Secondary | ICD-10-CM | POA: Diagnosis not present

## 2022-07-25 DIAGNOSIS — I10 Essential (primary) hypertension: Secondary | ICD-10-CM | POA: Diagnosis not present

## 2022-07-25 DIAGNOSIS — I350 Nonrheumatic aortic (valve) stenosis: Secondary | ICD-10-CM | POA: Diagnosis not present

## 2022-07-25 DIAGNOSIS — I69391 Dysphagia following cerebral infarction: Secondary | ICD-10-CM | POA: Diagnosis not present

## 2022-07-25 DIAGNOSIS — M5441 Lumbago with sciatica, right side: Secondary | ICD-10-CM | POA: Diagnosis not present

## 2022-07-25 DIAGNOSIS — I69351 Hemiplegia and hemiparesis following cerebral infarction affecting right dominant side: Secondary | ICD-10-CM | POA: Diagnosis not present

## 2022-07-25 DIAGNOSIS — G8929 Other chronic pain: Secondary | ICD-10-CM | POA: Diagnosis not present

## 2022-07-25 DIAGNOSIS — Z8616 Personal history of COVID-19: Secondary | ICD-10-CM | POA: Diagnosis not present

## 2022-07-25 DIAGNOSIS — M5013 Cervical disc disorder with radiculopathy, cervicothoracic region: Secondary | ICD-10-CM | POA: Diagnosis not present

## 2022-07-25 DIAGNOSIS — M1990 Unspecified osteoarthritis, unspecified site: Secondary | ICD-10-CM | POA: Diagnosis not present

## 2022-07-25 DIAGNOSIS — I472 Ventricular tachycardia, unspecified: Secondary | ICD-10-CM | POA: Diagnosis not present

## 2022-07-28 ENCOUNTER — Ambulatory Visit (INDEPENDENT_AMBULATORY_CARE_PROVIDER_SITE_OTHER): Payer: Medicare Other

## 2022-07-28 DIAGNOSIS — I69320 Aphasia following cerebral infarction: Secondary | ICD-10-CM

## 2022-07-28 DIAGNOSIS — M5010 Cervical disc disorder with radiculopathy, unspecified cervical region: Secondary | ICD-10-CM | POA: Diagnosis not present

## 2022-07-28 DIAGNOSIS — I351 Nonrheumatic aortic (valve) insufficiency: Secondary | ICD-10-CM | POA: Diagnosis not present

## 2022-07-28 DIAGNOSIS — M1990 Unspecified osteoarthritis, unspecified site: Secondary | ICD-10-CM

## 2022-07-28 DIAGNOSIS — I700 Atherosclerosis of aorta: Secondary | ICD-10-CM | POA: Diagnosis not present

## 2022-07-28 DIAGNOSIS — E43 Unspecified severe protein-calorie malnutrition: Secondary | ICD-10-CM | POA: Diagnosis not present

## 2022-07-28 DIAGNOSIS — H409 Unspecified glaucoma: Secondary | ICD-10-CM

## 2022-07-28 DIAGNOSIS — I69351 Hemiplegia and hemiparesis following cerebral infarction affecting right dominant side: Secondary | ICD-10-CM

## 2022-07-28 DIAGNOSIS — I69392 Facial weakness following cerebral infarction: Secondary | ICD-10-CM | POA: Diagnosis not present

## 2022-07-28 DIAGNOSIS — I69391 Dysphagia following cerebral infarction: Secondary | ICD-10-CM

## 2022-07-28 DIAGNOSIS — I10 Essential (primary) hypertension: Secondary | ICD-10-CM | POA: Diagnosis not present

## 2022-07-28 DIAGNOSIS — E785 Hyperlipidemia, unspecified: Secondary | ICD-10-CM

## 2022-07-28 DIAGNOSIS — G8929 Other chronic pain: Secondary | ICD-10-CM

## 2022-07-28 DIAGNOSIS — N400 Enlarged prostate without lower urinary tract symptoms: Secondary | ICD-10-CM

## 2022-07-29 ENCOUNTER — Encounter: Payer: Self-pay | Admitting: Family Medicine

## 2022-07-29 ENCOUNTER — Ambulatory Visit (INDEPENDENT_AMBULATORY_CARE_PROVIDER_SITE_OTHER): Payer: Medicare Other | Admitting: Family Medicine

## 2022-07-29 VITALS — BP 124/57 | HR 68 | Temp 96.5°F

## 2022-07-29 DIAGNOSIS — J189 Pneumonia, unspecified organism: Secondary | ICD-10-CM

## 2022-07-29 DIAGNOSIS — A4151 Sepsis due to Escherichia coli [E. coli]: Secondary | ICD-10-CM

## 2022-07-29 DIAGNOSIS — Z09 Encounter for follow-up examination after completed treatment for conditions other than malignant neoplasm: Secondary | ICD-10-CM

## 2022-07-29 NOTE — Progress Notes (Signed)
Subjective:  Patient ID: Paul Bradshaw, male    DOB: Jan 11, 1926, 89 y.o.   MRN: [id number]  Patient Care Team: Sonny Masters, FNP as PCP - General (Family Medicine)   Chief Complaint:  Hospitalization Follow-up (1/27-1/29 [facility]- [facility])   HPI: Paul Bradshaw is a 89 y.o. male presenting on 07/29/2022 for Hospitalization Follow-up (1/27-1/29 [facility])   Pt presents today for hospital discharge follow up. He went to the ED at [facility] 07/[facility]/2022 for fatigue, weakness, and shortness of breath. He was found to have sepsis due to RUL CAP. He was treated with IV antibiotics and discharged home on oral antibiotics. He has completed oral antibiotics and family member/caregiver states he has been doing very well since being home. He is eating and drinking well. No changes in urine output. Doing very well with in home PT and OT.      Relevant past medical, surgical, family, and social history reviewed and updated as indicated.  Allergies and medications reviewed and updated. Data reviewed: Chart in Epic.   Past Medical History:  Diagnosis Date   Aortic regurgitation    Moderate   Arthritis    BPH (benign prostatic hyperplasia)    Cervical disc disease    Cervical radiculopathy    Hyperlipidemia    Hypertension    Prediabetes 02/16/2021   Staphylococcus aureus bacteremia 08/09/2012   TEE negative for vegetation or thrombus February 2014   Stroke [facility]) 2005 or 2006   3   Symptomatic carotid artery stenosis with infarction [facility]) 2005 or 2006   Status post right carotid endarterectomy    Past Surgical History:  Procedure Laterality Date   BACK SURGERY     CAROTID ENDARTERECTOMY     CATARACT EXTRACTION W/PHACO Left 02/15/2015   Procedure: CATARACT EXTRACTION PHACO AND INTRAOCULAR LENS PLACEMENT LEFT EYE CDE=30.93;  Surgeon: Gemma Payor, MD;  Location: [facility] ORS;  Service: Ophthalmology;  Laterality: Left;   CHOLECYSTECTOMY     EYE SURGERY     KIDNEY STONE SURGERY     LUMBAR  LAMINECTOMY/DECOMPRESSION MICRODISCECTOMY Bilateral 01/23/2014   Procedure: LUMBAR LAMINECTOMY/DECOMPRESSION MICRODISCECTOMY 1 LEVEL L5-S1;  Surgeon: Temple Pacini, MD;  Location: [facility] NEURO ORS;  Service: Neurosurgery;  Laterality: Bilateral;  LUMBAR LAMINECTOMY/DECOMPRESSION MICRODISCECTOMY 1 LEVEL L5-S1   TEE WITHOUT CARDIOVERSION N/A 08/12/2012   Procedure: TRANSESOPHAGEAL ECHOCARDIOGRAM (TEE);  Surgeon: Wendall Stade, MD;  Location: [facility] ENDO SUITE;  Service: Cardiovascular;  Laterality: N/A;   TEE WITHOUT CARDIOVERSION N/A 06/24/2013   Procedure: TRANSESOPHAGEAL ECHOCARDIOGRAM (TEE);  Surgeon: Jonelle Sidle, MD;  Location: [facility] ENDO SUITE;  Service: Endoscopy;  Laterality: N/A;    Social History   Socioeconomic History   Marital status: Married    Spouse name: Not on file   Number of children: Not on file   Years of education: Not on file   Highest education level: Not on file  Occupational History   Not on file  Tobacco Use   Smoking status: Never   Smokeless tobacco: Never  Vaping Use   Vaping Use: Never used  Substance and Sexual Activity   Alcohol use: No   Drug use: No   Sexual activity: Not Currently  Other Topics Concern   Not on file  Social History Narrative   Not on file   Social Determinants of Health   Financial Resource Strain: Not on file  Food Insecurity: No Food Insecurity (05/10/2022)   Hunger Vital Sign    Worried About Running Out  of Food in the Last Year: Never true    Ran Out of Food in the Last Year: Never true  Transportation Needs: No Transportation Needs (07/22/2022)   PRAPARE - Administrator, Civil Service (Medical): No    Lack of Transportation (Non-Medical): No  Physical Activity: Not on file  Stress: Not on file  Social Connections: Not on file  Intimate Partner Violence: Not At Risk (05/10/2022)   Humiliation, Afraid, Rape, and Kick questionnaire    Fear of Current or Ex-Partner: No    Emotionally Abused: No    Physically  Abused: No    Sexually Abused: No    Outpatient Encounter Medications as of 07/29/2022  Medication Sig   cetirizine (ZYRTEC) 5 MG tablet Take 1 tablet (5 mg total) by mouth daily.   clopidogrel (PLAVIX) 75 MG tablet TAKE ONE TABLET EVERY DAY WITH BREAKFAST (Patient taking differently: Take 75 mg by mouth daily.)   famotidine (PEPCID) 20 MG tablet TAKE ONE TABLET TWICE DAILY   latanoprost (XALATAN) 0.005 % ophthalmic solution Place 1 drop into both eyes at bedtime.   metoprolol tartrate (LOPRESSOR) 25 MG tablet TAKE 1/2 TABLET TWICE DAILY   No facility-administered encounter medications on file as of 07/29/2022.    Allergies  Allergen Reactions   Amlodipine Swelling    Swelling of lips.   Lisinopril Other (See Comments)    Elevated BP higher & causes a burning feeling on the inside.     Review of Systems  Unable to perform ROS: Patient nonverbal        Objective:  BP (!) 124/57   Pulse 68   Temp (!) 96.5 F (35.8 C) (Temporal)   SpO2 96%    Wt Readings from Last 3 Encounters:  07/19/22 141 lb 15.6 oz (64.4 kg)  07/11/22 142 lb (64.4 kg)  05/10/22 145 lb (65.8 kg)    Physical Exam Vitals and nursing note reviewed.  Constitutional:      Appearance: He is ill-appearing (chronically ill).  HENT:     Head: Normocephalic and atraumatic.     Mouth/Throat:     Mouth: Mucous membranes are moist.  Eyes:     Conjunctiva/sclera: Conjunctivae normal.     Pupils: Pupils are equal, round, and reactive to light.  Cardiovascular:     Rate and Rhythm: Normal rate and regular rhythm.     Heart sounds: Normal heart sounds. No murmur heard.    No friction rub. No gallop.  Pulmonary:     Effort: Pulmonary effort is normal. No respiratory distress.     Breath sounds: No stridor. Rhonchi (RUL, clears with coughing) present. No wheezing or rales.  Chest:     Chest wall: No tenderness.  Musculoskeletal:     Right lower leg: No edema.     Left lower leg: No edema.  Neurological:      Mental Status: He is alert.     Motor: Weakness (RUE and RLE) present.     Gait: Gait abnormal (in wheelchair).  Psychiatric:        Mood and Affect: Affect is tearful.     Comments: Nonverbal since CVA, is able to mumble some sounds and will nod head yes and no to questions     Results for orders placed or performed during the hospital encounter of 07/19/22  Resp panel by RT-PCR (RSV, Flu A&B, Covid) Anterior Nasal Swab   Specimen: Anterior Nasal Swab  Result Value Ref Range   SARS Coronavirus 2  by RT PCR NEGATIVE NEGATIVE   Influenza A by PCR NEGATIVE NEGATIVE   Influenza B by PCR NEGATIVE NEGATIVE   Resp Syncytial Virus by PCR NEGATIVE NEGATIVE  Blood culture (routine x 2)   Specimen: BLOOD RIGHT FOREARM  Result Value Ref Range   Specimen Description      BLOOD RIGHT FOREARM BOTTLES DRAWN AEROBIC AND ANAEROBIC Performed at [facility], [address], [place], [place] [address]    Special Requests      Blood Culture adequate volume Performed at [facility], [address] [place], [place] [address]    Culture      NO GROWTH 5 DAYS Performed at [facility] [address] [place], [place] [address]    Report Status 07/24/2022 FINAL   Blood culture (routine x 2)   Specimen: Left Antecubital; Blood  Result Value Ref Range   Specimen Description      LEFT ANTECUBITAL BOTTLES DRAWN AEROBIC AND ANAEROBIC Performed at [facility], [address] [place], [place] [address]    Special Requests      Blood Culture adequate volume Performed at [facility], [address] [place], [place] [address]    Culture      NO GROWTH 5 DAYS Performed at [facility], [place] [address] [place],  [address]    Report Status 07/24/2022 FINAL   MRSA Next Gen by PCR, Nasal   Specimen: Nasal Mucosa; Nasal Swab  Result Value Ref Range   MRSA by PCR Next Gen DETECTED (A) NOT DETECTED  Comprehensive metabolic panel  Result Value Ref Range   Sodium 133 (L) 135 - 145  mmol/L   Potassium 4.0 3.5 - 5.1 mmol/L   Chloride 100 98 - 111 mmol/L   CO2 24 22 - 32 mmol/L   Glucose, Bld 101 (H) 70 - 99 mg/dL   BUN 16 8 - 23 mg/dL   Creatinine, Ser 0.89 0.61 - 1.24 mg/dL   Calcium 8.5 (L) 8.9 - 10.3 mg/dL   Total Protein 6.1 (L) 6.5 - 8.1 g/dL   Albumin 3.2 (L) 3.5 - 5.0 g/dL   AST 19 15 - 41 U/L   ALT 11 0 - 44 U/L   Alkaline Phosphatase 66 38 - 126 U/L   Total Bilirubin 1.1 0.3 - 1.2 mg/dL   GFR, Estimated >60 >60 mL/min   Anion gap 9 5 - 15  CBC with Differential  Result Value Ref Range   WBC 10.8 (H) 4.0 - 10.5 K/uL   RBC 3.75 (L) 4.22 - 5.81 MIL/uL   Hemoglobin 11.4 (L) 13.0 - 17.0 g/dL   HCT 35.3 (L) 39.0 - 52.0 %   MCV 94.1 80.0 - 100.0 fL   MCH 30.4 26.0 - 34.0 pg   MCHC 32.3 30.0 - 36.0 g/dL   RDW 14.4 11.5 - 15.5 %   Platelets 218 150 - 400 K/uL   nRBC 0.0 0.0 - 0.2 %   Neutrophils Relative % 82 %   Neutro Abs 8.9 (H) 1.7 - 7.7 K/uL   Lymphocytes Relative 9 %   Lymphs Abs 1.0 0.7 - 4.0 K/uL   Monocytes Relative 8 %   Monocytes Absolute 0.8 0.1 - 1.0 K/uL   Eosinophils Relative 0 %   Eosinophils Absolute 0.0 0.0 - 0.5 K/uL   Basophils Relative 0 %   Basophils Absolute 0.0 0.0 - 0.1 K/uL   Immature Granulocytes 1 %   Abs Immature Granulocytes 0.05 0.00 - 0.07 K/uL  Urinalysis, Routine w reflex microscopic -Urine, Catheterized  Result Value Ref Range   Color, Urine YELLOW YELLOW   APPearance CLEAR CLEAR   Specific Gravity, Urine 1.017 1.005 - 1.030   pH 6.0 5.0 - 8.0   Glucose, UA NEGATIVE NEGATIVE mg/dL   Hgb urine dipstick NEGATIVE NEGATIVE   Bilirubin Urine NEGATIVE NEGATIVE   Ketones, ur 5 (A) NEGATIVE mg/dL   Protein, ur NEGATIVE NEGATIVE mg/dL   Nitrite NEGATIVE NEGATIVE   Leukocytes,Ua NEGATIVE NEGATIVE  Lactic acid, plasma  Result Value Ref Range   Lactic Acid, Venous 1.0 0.5 - 1.9 mmol/L  Lactic acid, plasma  Result Value Ref Range   Lactic Acid, Venous 1.5 0.5 - 1.9 mmol/L  Lipase, blood  Result Value Ref Range    Lipase 24 11 - 51 U/L  Basic metabolic panel  Result Value Ref Range   Sodium 135 135 - 145 mmol/L   Potassium 3.3 (L) 3.5 - 5.1 mmol/L   Chloride 100 98 - 111 mmol/L   CO2 24 22 - 32 mmol/L   Glucose, Bld 95 70 - 99 mg/dL   BUN 13 8 - 23 mg/dL   Creatinine, Ser 0.91 0.61 - 1.24 mg/dL   Calcium 8.6 (L) 8.9 - 10.3 mg/dL   GFR, Estimated >60 >60 mL/min   Anion gap 11 5 - 15  CBC  Result Value Ref Range   WBC 9.8 4.0 - 10.5 K/uL   RBC 3.87 (L) 4.22 - 5.81 MIL/uL   Hemoglobin 11.8 (L) 13.0 - 17.0 g/dL   HCT 36.7 (L) 39.0 - 52.0 %   MCV 94.8 80.0 - 100.0 fL   MCH 30.5 26.0 - 34.0 pg   MCHC 32.2 30.0 - 36.0 g/dL   RDW 14.4 11.5 - 15.5 %   Platelets 213 150 - 400 K/uL   nRBC 0.0 0.0 - 0.2 %  Legionella Pneumophila Serogp 1 Ur Ag  Result Value Ref Range   L. pneumophila Serogp 1 Ur Ag Negative Negative   Source of Sample URINE, CLEAN CATCH   Strep pneumoniae urinary antigen  Result Value Ref Range   Strep Pneumo Urinary Antigen NEGATIVE NEGATIVE  Glucose, capillary  Result Value Ref Range   Glucose-Capillary 85 70 - 99 mg/dL  Urinalysis, Routine w reflex microscopic -Urine, Unspecified Source  Result Value Ref Range   Color, Urine YELLOW YELLOW   APPearance CLEAR CLEAR   Specific Gravity, Urine 1.014 1.005 - 1.030   pH 6.0 5.0 - 8.0   Glucose, UA NEGATIVE NEGATIVE mg/dL   Hgb urine dipstick LARGE (A) NEGATIVE   Bilirubin Urine NEGATIVE NEGATIVE   Ketones, ur 20 (A) NEGATIVE mg/dL   Protein, ur NEGATIVE NEGATIVE mg/dL   Nitrite NEGATIVE NEGATIVE   Leukocytes,Ua NEGATIVE NEGATIVE   RBC / HPF >50 0 - 5 RBC/hpf   WBC, UA 0-5 0 - 5 WBC/hpf   Bacteria, UA RARE (A) NONE SEEN   Squamous Epithelial / HPF 0-5 0 - 5 /HPF  Basic metabolic panel  Result Value Ref Range   Sodium 137 135 - 145 mmol/L   Potassium 3.8 3.5 - 5.1 mmol/L   Chloride 105 98 - 111 mmol/L   CO2 23 22 - 32 mmol/L   Glucose, Bld 88 70 - 99 mg/dL   BUN 14 8 - 23 mg/dL   Creatinine, Ser 0.93 0.61 - 1.24 mg/dL    Calcium 8.5 (L) 8.9 - 10.3 mg/dL   GFR, Estimated >60 >60 mL/min   Anion gap 9  5 - 15  Magnesium  Result Value Ref Range   Magnesium 1.9 1.7 - 2.4 mg/dL  CBC  Result Value Ref Range   WBC 6.5 4.0 - 10.5 K/uL   RBC 3.67 (L) 4.22 - 5.81 MIL/uL   Hemoglobin 11.0 (L) 13.0 - 17.0 g/dL   HCT 35.0 (L) 39.0 - 52.0 %   MCV 95.4 80.0 - 100.0 fL   MCH 30.0 26.0 - 34.0 pg   MCHC 31.4 30.0 - 36.0 g/dL   RDW 14.4 11.5 - 15.5 %   Platelets 212 150 - 400 K/uL   nRBC 0.0 0.0 - 0.2 %       Pertinent labs & imaging results that were available during my care of the patient were reviewed by me and considered in my medical decision making.  Assessment & Plan:  Alphonso was seen today for hospitalization follow-up.  Diagnoses and all orders for this visit:  Hospital discharge follow-up Today's visit was for Transitional Care Management. The patient was discharged from [facility] on 07/21/2022 with a primary diagnosis of CAP.  Contact with the patient and/or caregiver, by a clinical staff member, was made on 07/22/2022 and was documented as a telephone encounter within the EMR. Through chart review and discussion with the patient I have determined that management of their condition is of high complexity.   Community acquired pneumonia of right upper lobe of lung Sepsis due to Escherichia coli without acute organ dysfunction [facility]) Doing well since discharge home. Eating and drinking normally. No changes in urine output. Will repeat labs today.  -     BMP8+EGFR -     CBC with Differential/Platelet     Continue all other maintenance medications.  Follow up plan: Return if symptoms worsen or fail to improve.   Continue healthy lifestyle choices, including diet (rich in fruits, vegetables, and lean proteins, and low in salt and simple carbohydrates) and exercise (at least 30 minutes of moderate physical activity daily).  Educational handout given for CAP  The above assessment and management  plan was discussed with the patient. The patient verbalized understanding of and has agreed to the management plan. Patient is aware to call the clinic if they develop any new symptoms or if symptoms persist or worsen. Patient is aware when to return to the clinic for a follow-up visit. Patient educated on when it is appropriate to go to the emergency department.   Monia Pouch, FNP-C [facility] [phone]

## 2022-07-30 LAB — CBC WITH DIFFERENTIAL/PLATELET
Basophils Absolute: 0 10*3/uL (ref 0.0–0.2)
Basos: 0 %
EOS (ABSOLUTE): 0.1 10*3/uL (ref 0.0–0.4)
Eos: 2 %
Hematocrit: 37.7 % (ref 37.5–51.0)
Hemoglobin: 12.1 g/dL — ABNORMAL LOW (ref 13.0–17.7)
Immature Grans (Abs): 0.1 10*3/uL (ref 0.0–0.1)
Immature Granulocytes: 1 %
Lymphocytes Absolute: 1.3 10*3/uL (ref 0.7–3.1)
Lymphs: 18 %
MCH: 30 pg (ref 26.6–33.0)
MCHC: 32.1 g/dL (ref 31.5–35.7)
MCV: 93 fL (ref 79–97)
Monocytes Absolute: 0.5 10*3/uL (ref 0.1–0.9)
Monocytes: 6 %
Neutrophils Absolute: 5.6 10*3/uL (ref 1.4–7.0)
Neutrophils: 73 %
Platelets: 341 10*3/uL (ref 150–450)
RBC: 4.04 x10E6/uL — ABNORMAL LOW (ref 4.14–5.80)
RDW: 13 % (ref 11.6–15.4)
WBC: 7.7 10*3/uL (ref 3.4–10.8)

## 2022-07-30 LAB — BMP8+EGFR
BUN/Creatinine Ratio: 15 (ref 10–24)
BUN: 16 mg/dL (ref 10–36)
CO2: 24 mmol/L (ref 20–29)
Calcium: 8.9 mg/dL (ref 8.6–10.2)
Chloride: 101 mmol/L (ref 96–106)
Creatinine, Ser: 1.08 mg/dL (ref 0.76–1.27)
Glucose: 102 mg/dL — ABNORMAL HIGH (ref 70–99)
Potassium: 4.4 mmol/L (ref 3.5–5.2)
Sodium: 140 mmol/L (ref 134–144)
eGFR: 64 mL/min/{1.73_m2} (ref >59)

## 2022-08-01 DIAGNOSIS — G8929 Other chronic pain: Secondary | ICD-10-CM | POA: Diagnosis not present

## 2022-08-01 DIAGNOSIS — I69351 Hemiplegia and hemiparesis following cerebral infarction affecting right dominant side: Secondary | ICD-10-CM | POA: Diagnosis not present

## 2022-08-01 DIAGNOSIS — Z8616 Personal history of COVID-19: Secondary | ICD-10-CM | POA: Diagnosis not present

## 2022-08-01 DIAGNOSIS — M1990 Unspecified osteoarthritis, unspecified site: Secondary | ICD-10-CM | POA: Diagnosis not present

## 2022-08-01 DIAGNOSIS — M48061 Spinal stenosis, lumbar region without neurogenic claudication: Secondary | ICD-10-CM | POA: Diagnosis not present

## 2022-08-01 DIAGNOSIS — Z87442 Personal history of urinary calculi: Secondary | ICD-10-CM | POA: Diagnosis not present

## 2022-08-01 DIAGNOSIS — M5441 Lumbago with sciatica, right side: Secondary | ICD-10-CM | POA: Diagnosis not present

## 2022-08-01 DIAGNOSIS — Z8673 Personal history of transient ischemic attack (TIA), and cerebral infarction without residual deficits: Secondary | ICD-10-CM | POA: Diagnosis not present

## 2022-08-01 DIAGNOSIS — M5013 Cervical disc disorder with radiculopathy, cervicothoracic region: Secondary | ICD-10-CM | POA: Diagnosis not present

## 2022-08-01 DIAGNOSIS — H409 Unspecified glaucoma: Secondary | ICD-10-CM | POA: Diagnosis not present

## 2022-08-01 DIAGNOSIS — D649 Anemia, unspecified: Secondary | ICD-10-CM | POA: Diagnosis not present

## 2022-08-01 DIAGNOSIS — I472 Ventricular tachycardia, unspecified: Secondary | ICD-10-CM | POA: Diagnosis not present

## 2022-08-01 DIAGNOSIS — I350 Nonrheumatic aortic (valve) stenosis: Secondary | ICD-10-CM | POA: Diagnosis not present

## 2022-08-01 DIAGNOSIS — M5442 Lumbago with sciatica, left side: Secondary | ICD-10-CM | POA: Diagnosis not present

## 2022-08-01 DIAGNOSIS — E782 Mixed hyperlipidemia: Secondary | ICD-10-CM | POA: Diagnosis not present

## 2022-08-01 DIAGNOSIS — I10 Essential (primary) hypertension: Secondary | ICD-10-CM | POA: Diagnosis not present

## 2022-08-01 DIAGNOSIS — E43 Unspecified severe protein-calorie malnutrition: Secondary | ICD-10-CM | POA: Diagnosis not present

## 2022-08-01 DIAGNOSIS — I69320 Aphasia following cerebral infarction: Secondary | ICD-10-CM | POA: Diagnosis not present

## 2022-08-01 DIAGNOSIS — R7303 Prediabetes: Secondary | ICD-10-CM | POA: Diagnosis not present

## 2022-08-01 DIAGNOSIS — I69391 Dysphagia following cerebral infarction: Secondary | ICD-10-CM | POA: Diagnosis not present

## 2022-08-01 DIAGNOSIS — R1310 Dysphagia, unspecified: Secondary | ICD-10-CM | POA: Diagnosis not present

## 2022-08-01 DIAGNOSIS — Z7902 Long term (current) use of antithrombotics/antiplatelets: Secondary | ICD-10-CM | POA: Diagnosis not present

## 2022-08-05 DIAGNOSIS — I69320 Aphasia following cerebral infarction: Secondary | ICD-10-CM | POA: Diagnosis not present

## 2022-08-05 DIAGNOSIS — E43 Unspecified severe protein-calorie malnutrition: Secondary | ICD-10-CM | POA: Diagnosis not present

## 2022-08-05 DIAGNOSIS — M48061 Spinal stenosis, lumbar region without neurogenic claudication: Secondary | ICD-10-CM | POA: Diagnosis not present

## 2022-08-05 DIAGNOSIS — Z87442 Personal history of urinary calculi: Secondary | ICD-10-CM | POA: Diagnosis not present

## 2022-08-05 DIAGNOSIS — Z8673 Personal history of transient ischemic attack (TIA), and cerebral infarction without residual deficits: Secondary | ICD-10-CM | POA: Diagnosis not present

## 2022-08-05 DIAGNOSIS — R7303 Prediabetes: Secondary | ICD-10-CM | POA: Diagnosis not present

## 2022-08-05 DIAGNOSIS — M5013 Cervical disc disorder with radiculopathy, cervicothoracic region: Secondary | ICD-10-CM | POA: Diagnosis not present

## 2022-08-05 DIAGNOSIS — I69351 Hemiplegia and hemiparesis following cerebral infarction affecting right dominant side: Secondary | ICD-10-CM | POA: Diagnosis not present

## 2022-08-05 DIAGNOSIS — I472 Ventricular tachycardia, unspecified: Secondary | ICD-10-CM | POA: Diagnosis not present

## 2022-08-05 DIAGNOSIS — H409 Unspecified glaucoma: Secondary | ICD-10-CM | POA: Diagnosis not present

## 2022-08-05 DIAGNOSIS — Z7902 Long term (current) use of antithrombotics/antiplatelets: Secondary | ICD-10-CM | POA: Diagnosis not present

## 2022-08-05 DIAGNOSIS — E782 Mixed hyperlipidemia: Secondary | ICD-10-CM | POA: Diagnosis not present

## 2022-08-05 DIAGNOSIS — I350 Nonrheumatic aortic (valve) stenosis: Secondary | ICD-10-CM | POA: Diagnosis not present

## 2022-08-05 DIAGNOSIS — Z8616 Personal history of COVID-19: Secondary | ICD-10-CM | POA: Diagnosis not present

## 2022-08-05 DIAGNOSIS — R1310 Dysphagia, unspecified: Secondary | ICD-10-CM | POA: Diagnosis not present

## 2022-08-05 DIAGNOSIS — I10 Essential (primary) hypertension: Secondary | ICD-10-CM | POA: Diagnosis not present

## 2022-08-05 DIAGNOSIS — M5441 Lumbago with sciatica, right side: Secondary | ICD-10-CM | POA: Diagnosis not present

## 2022-08-05 DIAGNOSIS — G8929 Other chronic pain: Secondary | ICD-10-CM | POA: Diagnosis not present

## 2022-08-05 DIAGNOSIS — M1990 Unspecified osteoarthritis, unspecified site: Secondary | ICD-10-CM | POA: Diagnosis not present

## 2022-08-05 DIAGNOSIS — M5442 Lumbago with sciatica, left side: Secondary | ICD-10-CM | POA: Diagnosis not present

## 2022-08-05 DIAGNOSIS — I69391 Dysphagia following cerebral infarction: Secondary | ICD-10-CM | POA: Diagnosis not present

## 2022-08-05 DIAGNOSIS — D649 Anemia, unspecified: Secondary | ICD-10-CM | POA: Diagnosis not present

## 2022-08-06 ENCOUNTER — Other Ambulatory Visit: Payer: Self-pay | Admitting: Family Medicine

## 2022-08-06 DIAGNOSIS — Z87442 Personal history of urinary calculi: Secondary | ICD-10-CM | POA: Diagnosis not present

## 2022-08-06 DIAGNOSIS — I69351 Hemiplegia and hemiparesis following cerebral infarction affecting right dominant side: Secondary | ICD-10-CM | POA: Diagnosis not present

## 2022-08-06 DIAGNOSIS — M5441 Lumbago with sciatica, right side: Secondary | ICD-10-CM | POA: Diagnosis not present

## 2022-08-06 DIAGNOSIS — Z8673 Personal history of transient ischemic attack (TIA), and cerebral infarction without residual deficits: Secondary | ICD-10-CM | POA: Diagnosis not present

## 2022-08-06 DIAGNOSIS — G8929 Other chronic pain: Secondary | ICD-10-CM | POA: Diagnosis not present

## 2022-08-06 DIAGNOSIS — M48061 Spinal stenosis, lumbar region without neurogenic claudication: Secondary | ICD-10-CM | POA: Diagnosis not present

## 2022-08-06 DIAGNOSIS — E782 Mixed hyperlipidemia: Secondary | ICD-10-CM | POA: Diagnosis not present

## 2022-08-06 DIAGNOSIS — Z8616 Personal history of COVID-19: Secondary | ICD-10-CM | POA: Diagnosis not present

## 2022-08-06 DIAGNOSIS — I350 Nonrheumatic aortic (valve) stenosis: Secondary | ICD-10-CM | POA: Diagnosis not present

## 2022-08-06 DIAGNOSIS — M5442 Lumbago with sciatica, left side: Secondary | ICD-10-CM | POA: Diagnosis not present

## 2022-08-06 DIAGNOSIS — M5013 Cervical disc disorder with radiculopathy, cervicothoracic region: Secondary | ICD-10-CM | POA: Diagnosis not present

## 2022-08-06 DIAGNOSIS — Z7902 Long term (current) use of antithrombotics/antiplatelets: Secondary | ICD-10-CM | POA: Diagnosis not present

## 2022-08-06 DIAGNOSIS — R7303 Prediabetes: Secondary | ICD-10-CM | POA: Diagnosis not present

## 2022-08-06 DIAGNOSIS — M1990 Unspecified osteoarthritis, unspecified site: Secondary | ICD-10-CM | POA: Diagnosis not present

## 2022-08-06 DIAGNOSIS — I10 Essential (primary) hypertension: Secondary | ICD-10-CM | POA: Diagnosis not present

## 2022-08-06 DIAGNOSIS — D649 Anemia, unspecified: Secondary | ICD-10-CM | POA: Diagnosis not present

## 2022-08-06 DIAGNOSIS — I472 Ventricular tachycardia, unspecified: Secondary | ICD-10-CM | POA: Diagnosis not present

## 2022-08-06 DIAGNOSIS — H409 Unspecified glaucoma: Secondary | ICD-10-CM | POA: Diagnosis not present

## 2022-08-06 DIAGNOSIS — R1310 Dysphagia, unspecified: Secondary | ICD-10-CM | POA: Diagnosis not present

## 2022-08-06 DIAGNOSIS — I69391 Dysphagia following cerebral infarction: Secondary | ICD-10-CM | POA: Diagnosis not present

## 2022-08-06 DIAGNOSIS — I69320 Aphasia following cerebral infarction: Secondary | ICD-10-CM | POA: Diagnosis not present

## 2022-08-06 DIAGNOSIS — E43 Unspecified severe protein-calorie malnutrition: Secondary | ICD-10-CM | POA: Diagnosis not present

## 2022-08-07 DIAGNOSIS — M48061 Spinal stenosis, lumbar region without neurogenic claudication: Secondary | ICD-10-CM | POA: Diagnosis not present

## 2022-08-07 DIAGNOSIS — R1310 Dysphagia, unspecified: Secondary | ICD-10-CM | POA: Diagnosis not present

## 2022-08-07 DIAGNOSIS — I69320 Aphasia following cerebral infarction: Secondary | ICD-10-CM | POA: Diagnosis not present

## 2022-08-07 DIAGNOSIS — Z8616 Personal history of COVID-19: Secondary | ICD-10-CM | POA: Diagnosis not present

## 2022-08-07 DIAGNOSIS — R7303 Prediabetes: Secondary | ICD-10-CM | POA: Diagnosis not present

## 2022-08-07 DIAGNOSIS — G8929 Other chronic pain: Secondary | ICD-10-CM | POA: Diagnosis not present

## 2022-08-07 DIAGNOSIS — I10 Essential (primary) hypertension: Secondary | ICD-10-CM | POA: Diagnosis not present

## 2022-08-07 DIAGNOSIS — E782 Mixed hyperlipidemia: Secondary | ICD-10-CM | POA: Diagnosis not present

## 2022-08-07 DIAGNOSIS — I472 Ventricular tachycardia, unspecified: Secondary | ICD-10-CM | POA: Diagnosis not present

## 2022-08-07 DIAGNOSIS — M5442 Lumbago with sciatica, left side: Secondary | ICD-10-CM | POA: Diagnosis not present

## 2022-08-07 DIAGNOSIS — M1990 Unspecified osteoarthritis, unspecified site: Secondary | ICD-10-CM | POA: Diagnosis not present

## 2022-08-07 DIAGNOSIS — Z7902 Long term (current) use of antithrombotics/antiplatelets: Secondary | ICD-10-CM | POA: Diagnosis not present

## 2022-08-07 DIAGNOSIS — I350 Nonrheumatic aortic (valve) stenosis: Secondary | ICD-10-CM | POA: Diagnosis not present

## 2022-08-07 DIAGNOSIS — I69391 Dysphagia following cerebral infarction: Secondary | ICD-10-CM | POA: Diagnosis not present

## 2022-08-07 DIAGNOSIS — Z87442 Personal history of urinary calculi: Secondary | ICD-10-CM | POA: Diagnosis not present

## 2022-08-07 DIAGNOSIS — E43 Unspecified severe protein-calorie malnutrition: Secondary | ICD-10-CM | POA: Diagnosis not present

## 2022-08-07 DIAGNOSIS — M5441 Lumbago with sciatica, right side: Secondary | ICD-10-CM | POA: Diagnosis not present

## 2022-08-07 DIAGNOSIS — H409 Unspecified glaucoma: Secondary | ICD-10-CM | POA: Diagnosis not present

## 2022-08-07 DIAGNOSIS — D649 Anemia, unspecified: Secondary | ICD-10-CM | POA: Diagnosis not present

## 2022-08-07 DIAGNOSIS — Z8673 Personal history of transient ischemic attack (TIA), and cerebral infarction without residual deficits: Secondary | ICD-10-CM | POA: Diagnosis not present

## 2022-08-07 DIAGNOSIS — M5013 Cervical disc disorder with radiculopathy, cervicothoracic region: Secondary | ICD-10-CM | POA: Diagnosis not present

## 2022-08-07 DIAGNOSIS — I69351 Hemiplegia and hemiparesis following cerebral infarction affecting right dominant side: Secondary | ICD-10-CM | POA: Diagnosis not present

## 2022-08-10 DIAGNOSIS — I63512 Cerebral infarction due to unspecified occlusion or stenosis of left middle cerebral artery: Secondary | ICD-10-CM | POA: Diagnosis not present

## 2022-08-11 DIAGNOSIS — Z87442 Personal history of urinary calculi: Secondary | ICD-10-CM | POA: Diagnosis not present

## 2022-08-11 DIAGNOSIS — I69351 Hemiplegia and hemiparesis following cerebral infarction affecting right dominant side: Secondary | ICD-10-CM | POA: Diagnosis not present

## 2022-08-11 DIAGNOSIS — E43 Unspecified severe protein-calorie malnutrition: Secondary | ICD-10-CM | POA: Diagnosis not present

## 2022-08-11 DIAGNOSIS — Z8616 Personal history of COVID-19: Secondary | ICD-10-CM | POA: Diagnosis not present

## 2022-08-11 DIAGNOSIS — M1990 Unspecified osteoarthritis, unspecified site: Secondary | ICD-10-CM | POA: Diagnosis not present

## 2022-08-11 DIAGNOSIS — M48061 Spinal stenosis, lumbar region without neurogenic claudication: Secondary | ICD-10-CM | POA: Diagnosis not present

## 2022-08-11 DIAGNOSIS — R7303 Prediabetes: Secondary | ICD-10-CM | POA: Diagnosis not present

## 2022-08-11 DIAGNOSIS — M5441 Lumbago with sciatica, right side: Secondary | ICD-10-CM | POA: Diagnosis not present

## 2022-08-11 DIAGNOSIS — I10 Essential (primary) hypertension: Secondary | ICD-10-CM | POA: Diagnosis not present

## 2022-08-11 DIAGNOSIS — E782 Mixed hyperlipidemia: Secondary | ICD-10-CM | POA: Diagnosis not present

## 2022-08-11 DIAGNOSIS — I69391 Dysphagia following cerebral infarction: Secondary | ICD-10-CM | POA: Diagnosis not present

## 2022-08-11 DIAGNOSIS — I63512 Cerebral infarction due to unspecified occlusion or stenosis of left middle cerebral artery: Secondary | ICD-10-CM | POA: Diagnosis not present

## 2022-08-11 DIAGNOSIS — I69320 Aphasia following cerebral infarction: Secondary | ICD-10-CM | POA: Diagnosis not present

## 2022-08-11 DIAGNOSIS — H409 Unspecified glaucoma: Secondary | ICD-10-CM | POA: Diagnosis not present

## 2022-08-11 DIAGNOSIS — M5013 Cervical disc disorder with radiculopathy, cervicothoracic region: Secondary | ICD-10-CM | POA: Diagnosis not present

## 2022-08-11 DIAGNOSIS — I472 Ventricular tachycardia, unspecified: Secondary | ICD-10-CM | POA: Diagnosis not present

## 2022-08-11 DIAGNOSIS — Z8673 Personal history of transient ischemic attack (TIA), and cerebral infarction without residual deficits: Secondary | ICD-10-CM | POA: Diagnosis not present

## 2022-08-11 DIAGNOSIS — M5442 Lumbago with sciatica, left side: Secondary | ICD-10-CM | POA: Diagnosis not present

## 2022-08-11 DIAGNOSIS — I350 Nonrheumatic aortic (valve) stenosis: Secondary | ICD-10-CM | POA: Diagnosis not present

## 2022-08-11 DIAGNOSIS — G8929 Other chronic pain: Secondary | ICD-10-CM | POA: Diagnosis not present

## 2022-08-11 DIAGNOSIS — D649 Anemia, unspecified: Secondary | ICD-10-CM | POA: Diagnosis not present

## 2022-08-11 DIAGNOSIS — Z7902 Long term (current) use of antithrombotics/antiplatelets: Secondary | ICD-10-CM | POA: Diagnosis not present

## 2022-08-11 DIAGNOSIS — R1310 Dysphagia, unspecified: Secondary | ICD-10-CM | POA: Diagnosis not present

## 2022-08-12 ENCOUNTER — Telehealth: Payer: Self-pay | Admitting: *Deleted

## 2022-08-12 DIAGNOSIS — I10 Essential (primary) hypertension: Secondary | ICD-10-CM | POA: Diagnosis not present

## 2022-08-12 DIAGNOSIS — M5013 Cervical disc disorder with radiculopathy, cervicothoracic region: Secondary | ICD-10-CM | POA: Diagnosis not present

## 2022-08-12 DIAGNOSIS — Z7902 Long term (current) use of antithrombotics/antiplatelets: Secondary | ICD-10-CM | POA: Diagnosis not present

## 2022-08-12 DIAGNOSIS — I472 Ventricular tachycardia, unspecified: Secondary | ICD-10-CM | POA: Diagnosis not present

## 2022-08-12 DIAGNOSIS — I69320 Aphasia following cerebral infarction: Secondary | ICD-10-CM | POA: Diagnosis not present

## 2022-08-12 DIAGNOSIS — M5441 Lumbago with sciatica, right side: Secondary | ICD-10-CM | POA: Diagnosis not present

## 2022-08-12 DIAGNOSIS — I69391 Dysphagia following cerebral infarction: Secondary | ICD-10-CM | POA: Diagnosis not present

## 2022-08-12 DIAGNOSIS — D649 Anemia, unspecified: Secondary | ICD-10-CM | POA: Diagnosis not present

## 2022-08-12 DIAGNOSIS — E43 Unspecified severe protein-calorie malnutrition: Secondary | ICD-10-CM | POA: Diagnosis not present

## 2022-08-12 DIAGNOSIS — Z87442 Personal history of urinary calculi: Secondary | ICD-10-CM | POA: Diagnosis not present

## 2022-08-12 DIAGNOSIS — I69351 Hemiplegia and hemiparesis following cerebral infarction affecting right dominant side: Secondary | ICD-10-CM | POA: Diagnosis not present

## 2022-08-12 DIAGNOSIS — H409 Unspecified glaucoma: Secondary | ICD-10-CM | POA: Diagnosis not present

## 2022-08-12 DIAGNOSIS — Z8616 Personal history of COVID-19: Secondary | ICD-10-CM | POA: Diagnosis not present

## 2022-08-12 DIAGNOSIS — R1310 Dysphagia, unspecified: Secondary | ICD-10-CM | POA: Diagnosis not present

## 2022-08-12 DIAGNOSIS — I350 Nonrheumatic aortic (valve) stenosis: Secondary | ICD-10-CM | POA: Diagnosis not present

## 2022-08-12 DIAGNOSIS — M5442 Lumbago with sciatica, left side: Secondary | ICD-10-CM | POA: Diagnosis not present

## 2022-08-12 DIAGNOSIS — E782 Mixed hyperlipidemia: Secondary | ICD-10-CM | POA: Diagnosis not present

## 2022-08-12 DIAGNOSIS — G8929 Other chronic pain: Secondary | ICD-10-CM | POA: Diagnosis not present

## 2022-08-12 DIAGNOSIS — Z8673 Personal history of transient ischemic attack (TIA), and cerebral infarction without residual deficits: Secondary | ICD-10-CM | POA: Diagnosis not present

## 2022-08-12 DIAGNOSIS — R7303 Prediabetes: Secondary | ICD-10-CM | POA: Diagnosis not present

## 2022-08-12 DIAGNOSIS — M1990 Unspecified osteoarthritis, unspecified site: Secondary | ICD-10-CM | POA: Diagnosis not present

## 2022-08-12 DIAGNOSIS — M48061 Spinal stenosis, lumbar region without neurogenic claudication: Secondary | ICD-10-CM | POA: Diagnosis not present

## 2022-08-12 NOTE — Telephone Encounter (Signed)
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 She did not see any contraindications w/ pt using it, it does like ROM, mirror other hand. If you agree as well that he can use the glove you do not have to call [place] back.

## 2022-08-13 DIAGNOSIS — I10 Essential (primary) hypertension: Secondary | ICD-10-CM | POA: Diagnosis not present

## 2022-08-13 DIAGNOSIS — M5013 Cervical disc disorder with radiculopathy, cervicothoracic region: Secondary | ICD-10-CM | POA: Diagnosis not present

## 2022-08-13 DIAGNOSIS — Z8616 Personal history of COVID-19: Secondary | ICD-10-CM | POA: Diagnosis not present

## 2022-08-13 DIAGNOSIS — Z87442 Personal history of urinary calculi: Secondary | ICD-10-CM | POA: Diagnosis not present

## 2022-08-13 DIAGNOSIS — I472 Ventricular tachycardia, unspecified: Secondary | ICD-10-CM | POA: Diagnosis not present

## 2022-08-13 DIAGNOSIS — R1310 Dysphagia, unspecified: Secondary | ICD-10-CM | POA: Diagnosis not present

## 2022-08-13 DIAGNOSIS — E43 Unspecified severe protein-calorie malnutrition: Secondary | ICD-10-CM | POA: Diagnosis not present

## 2022-08-13 DIAGNOSIS — M48061 Spinal stenosis, lumbar region without neurogenic claudication: Secondary | ICD-10-CM | POA: Diagnosis not present

## 2022-08-13 DIAGNOSIS — I69391 Dysphagia following cerebral infarction: Secondary | ICD-10-CM | POA: Diagnosis not present

## 2022-08-13 DIAGNOSIS — H409 Unspecified glaucoma: Secondary | ICD-10-CM | POA: Diagnosis not present

## 2022-08-13 DIAGNOSIS — Z7902 Long term (current) use of antithrombotics/antiplatelets: Secondary | ICD-10-CM | POA: Diagnosis not present

## 2022-08-13 DIAGNOSIS — E782 Mixed hyperlipidemia: Secondary | ICD-10-CM | POA: Diagnosis not present

## 2022-08-13 DIAGNOSIS — R7303 Prediabetes: Secondary | ICD-10-CM | POA: Diagnosis not present

## 2022-08-13 DIAGNOSIS — M1990 Unspecified osteoarthritis, unspecified site: Secondary | ICD-10-CM | POA: Diagnosis not present

## 2022-08-13 DIAGNOSIS — G8929 Other chronic pain: Secondary | ICD-10-CM | POA: Diagnosis not present

## 2022-08-13 DIAGNOSIS — I69351 Hemiplegia and hemiparesis following cerebral infarction affecting right dominant side: Secondary | ICD-10-CM | POA: Diagnosis not present

## 2022-08-13 DIAGNOSIS — D649 Anemia, unspecified: Secondary | ICD-10-CM | POA: Diagnosis not present

## 2022-08-13 DIAGNOSIS — I350 Nonrheumatic aortic (valve) stenosis: Secondary | ICD-10-CM | POA: Diagnosis not present

## 2022-08-13 DIAGNOSIS — M5441 Lumbago with sciatica, right side: Secondary | ICD-10-CM | POA: Diagnosis not present

## 2022-08-13 DIAGNOSIS — I69320 Aphasia following cerebral infarction: Secondary | ICD-10-CM | POA: Diagnosis not present

## 2022-08-13 DIAGNOSIS — M5442 Lumbago with sciatica, left side: Secondary | ICD-10-CM | POA: Diagnosis not present

## 2022-08-13 DIAGNOSIS — Z8673 Personal history of transient ischemic attack (TIA), and cerebral infarction without residual deficits: Secondary | ICD-10-CM | POA: Diagnosis not present

## 2022-08-18 DIAGNOSIS — M48061 Spinal stenosis, lumbar region without neurogenic claudication: Secondary | ICD-10-CM | POA: Diagnosis not present

## 2022-08-18 DIAGNOSIS — Z8673 Personal history of transient ischemic attack (TIA), and cerebral infarction without residual deficits: Secondary | ICD-10-CM | POA: Diagnosis not present

## 2022-08-18 DIAGNOSIS — I350 Nonrheumatic aortic (valve) stenosis: Secondary | ICD-10-CM | POA: Diagnosis not present

## 2022-08-18 DIAGNOSIS — I472 Ventricular tachycardia, unspecified: Secondary | ICD-10-CM | POA: Diagnosis not present

## 2022-08-18 DIAGNOSIS — H409 Unspecified glaucoma: Secondary | ICD-10-CM | POA: Diagnosis not present

## 2022-08-18 DIAGNOSIS — M5441 Lumbago with sciatica, right side: Secondary | ICD-10-CM | POA: Diagnosis not present

## 2022-08-18 DIAGNOSIS — I10 Essential (primary) hypertension: Secondary | ICD-10-CM | POA: Diagnosis not present

## 2022-08-18 DIAGNOSIS — R7303 Prediabetes: Secondary | ICD-10-CM | POA: Diagnosis not present

## 2022-08-18 DIAGNOSIS — M5442 Lumbago with sciatica, left side: Secondary | ICD-10-CM | POA: Diagnosis not present

## 2022-08-18 DIAGNOSIS — Z87442 Personal history of urinary calculi: Secondary | ICD-10-CM | POA: Diagnosis not present

## 2022-08-18 DIAGNOSIS — I69320 Aphasia following cerebral infarction: Secondary | ICD-10-CM | POA: Diagnosis not present

## 2022-08-18 DIAGNOSIS — Z8616 Personal history of COVID-19: Secondary | ICD-10-CM | POA: Diagnosis not present

## 2022-08-18 DIAGNOSIS — E43 Unspecified severe protein-calorie malnutrition: Secondary | ICD-10-CM | POA: Diagnosis not present

## 2022-08-18 DIAGNOSIS — I69351 Hemiplegia and hemiparesis following cerebral infarction affecting right dominant side: Secondary | ICD-10-CM | POA: Diagnosis not present

## 2022-08-18 DIAGNOSIS — R1310 Dysphagia, unspecified: Secondary | ICD-10-CM | POA: Diagnosis not present

## 2022-08-18 DIAGNOSIS — Z7902 Long term (current) use of antithrombotics/antiplatelets: Secondary | ICD-10-CM | POA: Diagnosis not present

## 2022-08-18 DIAGNOSIS — D649 Anemia, unspecified: Secondary | ICD-10-CM | POA: Diagnosis not present

## 2022-08-18 DIAGNOSIS — I69391 Dysphagia following cerebral infarction: Secondary | ICD-10-CM | POA: Diagnosis not present

## 2022-08-18 DIAGNOSIS — E782 Mixed hyperlipidemia: Secondary | ICD-10-CM | POA: Diagnosis not present

## 2022-08-18 DIAGNOSIS — M1990 Unspecified osteoarthritis, unspecified site: Secondary | ICD-10-CM | POA: Diagnosis not present

## 2022-08-18 DIAGNOSIS — G8929 Other chronic pain: Secondary | ICD-10-CM | POA: Diagnosis not present

## 2022-08-18 DIAGNOSIS — M5013 Cervical disc disorder with radiculopathy, cervicothoracic region: Secondary | ICD-10-CM | POA: Diagnosis not present

## 2022-08-20 DIAGNOSIS — Z8616 Personal history of COVID-19: Secondary | ICD-10-CM | POA: Diagnosis not present

## 2022-08-20 DIAGNOSIS — M48061 Spinal stenosis, lumbar region without neurogenic claudication: Secondary | ICD-10-CM | POA: Diagnosis not present

## 2022-08-20 DIAGNOSIS — Z87442 Personal history of urinary calculi: Secondary | ICD-10-CM | POA: Diagnosis not present

## 2022-08-20 DIAGNOSIS — I10 Essential (primary) hypertension: Secondary | ICD-10-CM | POA: Diagnosis not present

## 2022-08-20 DIAGNOSIS — M5441 Lumbago with sciatica, right side: Secondary | ICD-10-CM | POA: Diagnosis not present

## 2022-08-20 DIAGNOSIS — D649 Anemia, unspecified: Secondary | ICD-10-CM | POA: Diagnosis not present

## 2022-08-20 DIAGNOSIS — I69320 Aphasia following cerebral infarction: Secondary | ICD-10-CM | POA: Diagnosis not present

## 2022-08-20 DIAGNOSIS — R7303 Prediabetes: Secondary | ICD-10-CM | POA: Diagnosis not present

## 2022-08-20 DIAGNOSIS — M5013 Cervical disc disorder with radiculopathy, cervicothoracic region: Secondary | ICD-10-CM | POA: Diagnosis not present

## 2022-08-20 DIAGNOSIS — H409 Unspecified glaucoma: Secondary | ICD-10-CM | POA: Diagnosis not present

## 2022-08-20 DIAGNOSIS — M5442 Lumbago with sciatica, left side: Secondary | ICD-10-CM | POA: Diagnosis not present

## 2022-08-20 DIAGNOSIS — I69391 Dysphagia following cerebral infarction: Secondary | ICD-10-CM | POA: Diagnosis not present

## 2022-08-20 DIAGNOSIS — I472 Ventricular tachycardia, unspecified: Secondary | ICD-10-CM | POA: Diagnosis not present

## 2022-08-20 DIAGNOSIS — I350 Nonrheumatic aortic (valve) stenosis: Secondary | ICD-10-CM | POA: Diagnosis not present

## 2022-08-20 DIAGNOSIS — I69351 Hemiplegia and hemiparesis following cerebral infarction affecting right dominant side: Secondary | ICD-10-CM | POA: Diagnosis not present

## 2022-08-20 DIAGNOSIS — G8929 Other chronic pain: Secondary | ICD-10-CM | POA: Diagnosis not present

## 2022-08-20 DIAGNOSIS — Z7902 Long term (current) use of antithrombotics/antiplatelets: Secondary | ICD-10-CM | POA: Diagnosis not present

## 2022-08-20 DIAGNOSIS — E782 Mixed hyperlipidemia: Secondary | ICD-10-CM | POA: Diagnosis not present

## 2022-08-20 DIAGNOSIS — R1310 Dysphagia, unspecified: Secondary | ICD-10-CM | POA: Diagnosis not present

## 2022-08-20 DIAGNOSIS — M1990 Unspecified osteoarthritis, unspecified site: Secondary | ICD-10-CM | POA: Diagnosis not present

## 2022-08-20 DIAGNOSIS — Z8673 Personal history of transient ischemic attack (TIA), and cerebral infarction without residual deficits: Secondary | ICD-10-CM | POA: Diagnosis not present

## 2022-08-20 DIAGNOSIS — E43 Unspecified severe protein-calorie malnutrition: Secondary | ICD-10-CM | POA: Diagnosis not present

## 2022-08-21 ENCOUNTER — Telehealth: Payer: Self-pay | Admitting: Family Medicine

## 2022-08-21 DIAGNOSIS — M5013 Cervical disc disorder with radiculopathy, cervicothoracic region: Secondary | ICD-10-CM | POA: Diagnosis not present

## 2022-08-21 DIAGNOSIS — D649 Anemia, unspecified: Secondary | ICD-10-CM | POA: Diagnosis not present

## 2022-08-21 DIAGNOSIS — M1990 Unspecified osteoarthritis, unspecified site: Secondary | ICD-10-CM | POA: Diagnosis not present

## 2022-08-21 DIAGNOSIS — G8929 Other chronic pain: Secondary | ICD-10-CM | POA: Diagnosis not present

## 2022-08-21 DIAGNOSIS — E782 Mixed hyperlipidemia: Secondary | ICD-10-CM | POA: Diagnosis not present

## 2022-08-21 DIAGNOSIS — I69351 Hemiplegia and hemiparesis following cerebral infarction affecting right dominant side: Secondary | ICD-10-CM | POA: Diagnosis not present

## 2022-08-21 DIAGNOSIS — I10 Essential (primary) hypertension: Secondary | ICD-10-CM | POA: Diagnosis not present

## 2022-08-21 DIAGNOSIS — R1310 Dysphagia, unspecified: Secondary | ICD-10-CM | POA: Diagnosis not present

## 2022-08-21 DIAGNOSIS — I69391 Dysphagia following cerebral infarction: Secondary | ICD-10-CM | POA: Diagnosis not present

## 2022-08-21 DIAGNOSIS — Z87442 Personal history of urinary calculi: Secondary | ICD-10-CM | POA: Diagnosis not present

## 2022-08-21 DIAGNOSIS — I350 Nonrheumatic aortic (valve) stenosis: Secondary | ICD-10-CM | POA: Diagnosis not present

## 2022-08-21 DIAGNOSIS — Z7902 Long term (current) use of antithrombotics/antiplatelets: Secondary | ICD-10-CM | POA: Diagnosis not present

## 2022-08-21 DIAGNOSIS — Z8616 Personal history of COVID-19: Secondary | ICD-10-CM | POA: Diagnosis not present

## 2022-08-21 DIAGNOSIS — R7303 Prediabetes: Secondary | ICD-10-CM | POA: Diagnosis not present

## 2022-08-21 DIAGNOSIS — H409 Unspecified glaucoma: Secondary | ICD-10-CM | POA: Diagnosis not present

## 2022-08-21 DIAGNOSIS — I472 Ventricular tachycardia, unspecified: Secondary | ICD-10-CM | POA: Diagnosis not present

## 2022-08-21 DIAGNOSIS — M5442 Lumbago with sciatica, left side: Secondary | ICD-10-CM | POA: Diagnosis not present

## 2022-08-21 DIAGNOSIS — Z8673 Personal history of transient ischemic attack (TIA), and cerebral infarction without residual deficits: Secondary | ICD-10-CM | POA: Diagnosis not present

## 2022-08-21 DIAGNOSIS — M5441 Lumbago with sciatica, right side: Secondary | ICD-10-CM | POA: Diagnosis not present

## 2022-08-21 DIAGNOSIS — M48061 Spinal stenosis, lumbar region without neurogenic claudication: Secondary | ICD-10-CM | POA: Diagnosis not present

## 2022-08-21 DIAGNOSIS — E43 Unspecified severe protein-calorie malnutrition: Secondary | ICD-10-CM | POA: Diagnosis not present

## 2022-08-21 DIAGNOSIS — I69320 Aphasia following cerebral infarction: Secondary | ICD-10-CM | POA: Diagnosis not present

## 2022-08-22 NOTE — Telephone Encounter (Signed)
RTC to [facility] PT w/ [place] VO given for discharge.

## 2022-08-25 DIAGNOSIS — G8929 Other chronic pain: Secondary | ICD-10-CM | POA: Diagnosis not present

## 2022-08-25 DIAGNOSIS — I350 Nonrheumatic aortic (valve) stenosis: Secondary | ICD-10-CM | POA: Diagnosis not present

## 2022-08-25 DIAGNOSIS — R1310 Dysphagia, unspecified: Secondary | ICD-10-CM | POA: Diagnosis not present

## 2022-08-25 DIAGNOSIS — M1990 Unspecified osteoarthritis, unspecified site: Secondary | ICD-10-CM | POA: Diagnosis not present

## 2022-08-25 DIAGNOSIS — M5442 Lumbago with sciatica, left side: Secondary | ICD-10-CM | POA: Diagnosis not present

## 2022-08-25 DIAGNOSIS — I472 Ventricular tachycardia, unspecified: Secondary | ICD-10-CM | POA: Diagnosis not present

## 2022-08-25 DIAGNOSIS — M48061 Spinal stenosis, lumbar region without neurogenic claudication: Secondary | ICD-10-CM | POA: Diagnosis not present

## 2022-08-25 DIAGNOSIS — Z87442 Personal history of urinary calculi: Secondary | ICD-10-CM | POA: Diagnosis not present

## 2022-08-25 DIAGNOSIS — D649 Anemia, unspecified: Secondary | ICD-10-CM | POA: Diagnosis not present

## 2022-08-25 DIAGNOSIS — I10 Essential (primary) hypertension: Secondary | ICD-10-CM | POA: Diagnosis not present

## 2022-08-25 DIAGNOSIS — E43 Unspecified severe protein-calorie malnutrition: Secondary | ICD-10-CM | POA: Diagnosis not present

## 2022-08-25 DIAGNOSIS — H409 Unspecified glaucoma: Secondary | ICD-10-CM | POA: Diagnosis not present

## 2022-08-25 DIAGNOSIS — Z7902 Long term (current) use of antithrombotics/antiplatelets: Secondary | ICD-10-CM | POA: Diagnosis not present

## 2022-08-25 DIAGNOSIS — Z8616 Personal history of COVID-19: Secondary | ICD-10-CM | POA: Diagnosis not present

## 2022-08-25 DIAGNOSIS — E782 Mixed hyperlipidemia: Secondary | ICD-10-CM | POA: Diagnosis not present

## 2022-08-25 DIAGNOSIS — R7303 Prediabetes: Secondary | ICD-10-CM | POA: Diagnosis not present

## 2022-08-25 DIAGNOSIS — I69391 Dysphagia following cerebral infarction: Secondary | ICD-10-CM | POA: Diagnosis not present

## 2022-08-25 DIAGNOSIS — M5013 Cervical disc disorder with radiculopathy, cervicothoracic region: Secondary | ICD-10-CM | POA: Diagnosis not present

## 2022-08-25 DIAGNOSIS — M5441 Lumbago with sciatica, right side: Secondary | ICD-10-CM | POA: Diagnosis not present

## 2022-08-25 DIAGNOSIS — I69320 Aphasia following cerebral infarction: Secondary | ICD-10-CM | POA: Diagnosis not present

## 2022-08-25 DIAGNOSIS — Z8673 Personal history of transient ischemic attack (TIA), and cerebral infarction without residual deficits: Secondary | ICD-10-CM | POA: Diagnosis not present

## 2022-08-25 DIAGNOSIS — I69351 Hemiplegia and hemiparesis following cerebral infarction affecting right dominant side: Secondary | ICD-10-CM | POA: Diagnosis not present

## 2022-08-27 DIAGNOSIS — M5441 Lumbago with sciatica, right side: Secondary | ICD-10-CM | POA: Diagnosis not present

## 2022-08-27 DIAGNOSIS — M1990 Unspecified osteoarthritis, unspecified site: Secondary | ICD-10-CM | POA: Diagnosis not present

## 2022-08-27 DIAGNOSIS — M5442 Lumbago with sciatica, left side: Secondary | ICD-10-CM | POA: Diagnosis not present

## 2022-08-27 DIAGNOSIS — Z8673 Personal history of transient ischemic attack (TIA), and cerebral infarction without residual deficits: Secondary | ICD-10-CM | POA: Diagnosis not present

## 2022-08-27 DIAGNOSIS — R1310 Dysphagia, unspecified: Secondary | ICD-10-CM | POA: Diagnosis not present

## 2022-08-27 DIAGNOSIS — I10 Essential (primary) hypertension: Secondary | ICD-10-CM | POA: Diagnosis not present

## 2022-08-27 DIAGNOSIS — I350 Nonrheumatic aortic (valve) stenosis: Secondary | ICD-10-CM | POA: Diagnosis not present

## 2022-08-27 DIAGNOSIS — E43 Unspecified severe protein-calorie malnutrition: Secondary | ICD-10-CM | POA: Diagnosis not present

## 2022-08-27 DIAGNOSIS — Z87442 Personal history of urinary calculi: Secondary | ICD-10-CM | POA: Diagnosis not present

## 2022-08-27 DIAGNOSIS — I69351 Hemiplegia and hemiparesis following cerebral infarction affecting right dominant side: Secondary | ICD-10-CM | POA: Diagnosis not present

## 2022-08-27 DIAGNOSIS — I69320 Aphasia following cerebral infarction: Secondary | ICD-10-CM | POA: Diagnosis not present

## 2022-08-27 DIAGNOSIS — R7303 Prediabetes: Secondary | ICD-10-CM | POA: Diagnosis not present

## 2022-08-27 DIAGNOSIS — D649 Anemia, unspecified: Secondary | ICD-10-CM | POA: Diagnosis not present

## 2022-08-27 DIAGNOSIS — I472 Ventricular tachycardia, unspecified: Secondary | ICD-10-CM | POA: Diagnosis not present

## 2022-08-27 DIAGNOSIS — G8929 Other chronic pain: Secondary | ICD-10-CM | POA: Diagnosis not present

## 2022-08-27 DIAGNOSIS — Z7902 Long term (current) use of antithrombotics/antiplatelets: Secondary | ICD-10-CM | POA: Diagnosis not present

## 2022-08-27 DIAGNOSIS — M48061 Spinal stenosis, lumbar region without neurogenic claudication: Secondary | ICD-10-CM | POA: Diagnosis not present

## 2022-08-27 DIAGNOSIS — Z8616 Personal history of COVID-19: Secondary | ICD-10-CM | POA: Diagnosis not present

## 2022-08-27 DIAGNOSIS — I69391 Dysphagia following cerebral infarction: Secondary | ICD-10-CM | POA: Diagnosis not present

## 2022-08-27 DIAGNOSIS — H409 Unspecified glaucoma: Secondary | ICD-10-CM | POA: Diagnosis not present

## 2022-08-27 DIAGNOSIS — E782 Mixed hyperlipidemia: Secondary | ICD-10-CM | POA: Diagnosis not present

## 2022-08-27 DIAGNOSIS — M5013 Cervical disc disorder with radiculopathy, cervicothoracic region: Secondary | ICD-10-CM | POA: Diagnosis not present

## 2022-08-29 ENCOUNTER — Ambulatory Visit (INDEPENDENT_AMBULATORY_CARE_PROVIDER_SITE_OTHER): Payer: Medicare Other | Admitting: Family Medicine

## 2022-08-29 ENCOUNTER — Encounter: Payer: Self-pay | Admitting: Family Medicine

## 2022-08-29 VITALS — BP 111/67 | HR 91 | Temp 97.7°F | Ht 68.0 in | Wt 144.0 lb

## 2022-08-29 DIAGNOSIS — J069 Acute upper respiratory infection, unspecified: Secondary | ICD-10-CM | POA: Diagnosis not present

## 2022-08-29 LAB — VERITOR FLU A/B WAIVED
Influenza A: NEGATIVE
Influenza B: NEGATIVE

## 2022-08-29 NOTE — Patient Instructions (Signed)

## 2022-08-29 NOTE — Progress Notes (Signed)
   Acute Office Visit  Subjective:     Patient ID: Paul Bradshaw, male    DOB: 07-27-1925, 89 y.o.   MRN: [id number]  Chief Complaint  Patient presents with   Cough    HPI Here with son today. Patient is in today for fatigue, cough, sore throat, nasal congestion, and decreased appetite for 2-3 days. Denies denies fever, vomiting, diarrhea, chills. He has been drinking some boost and has eaten some. They have been working to keep him hydrated. Denies known exposure to illness. He was recently visiting family members however.   ROS As per HPI.      Objective:    BP 111/67   Pulse 91   Temp 97.7 F (36.5 C) (Temporal)   Ht 5\' 8"  (1.727 m)   Wt 144 lb (65.3 kg)   SpO2 97%   BMI 21.90 kg/m    Physical Exam Vitals and nursing note reviewed.  Constitutional:      General: He is not in acute distress.    Appearance: He is ill-appearing (chronically ill). He is not toxic-appearing or diaphoretic.  HENT:     Head: Normocephalic and atraumatic.     Right Ear: Tympanic membrane, ear canal and external ear normal.     Left Ear: Tympanic membrane, ear canal and external ear normal.     Nose: Congestion present.     Mouth/Throat:     Mouth: Mucous membranes are moist.     Pharynx: Posterior oropharyngeal erythema present. No pharyngeal swelling or oropharyngeal exudate.     Tonsils: No tonsillar exudate.  Eyes:     General:        Right eye: No discharge.        Left eye: No discharge.     Conjunctiva/sclera: Conjunctivae normal.  Cardiovascular:     Rate and Rhythm: Normal rate and regular rhythm.     Pulses: Normal pulses.     Heart sounds: Normal heart sounds.  Pulmonary:     Effort: Pulmonary effort is normal. No respiratory distress.     Breath sounds: No wheezing, rhonchi or rales.  Musculoskeletal:     Cervical back: Neck supple. No rigidity.     Right lower leg: No edema.     Left lower leg: No edema.  Lymphadenopathy:     Cervical: No cervical adenopathy.   Skin:    General: Skin is warm and dry.  Neurological:     Mental Status: He is alert and oriented to person, place, and time. Mental status is at baseline.     Motor: Weakness (generalized) present.     Gait: Gait abnormal (arrives in wheelchair).  Psychiatric:        Behavior: Behavior normal.     No results found for any visits on 08/29/22.      Assessment & Plan:   Katelyn was seen today for cough.  Diagnoses and all orders for this visit:  Viral URI Negative rapid flu. They will do a home Covid test and notify the office for a positive result. Discussed symptomatic care and return precautions. Discussed hydration and when to seek emergency care.  -     Veritor Flu A/B Waived  The patient indicates understanding of these issues and agrees with the plan.   Gwenlyn Perking, FNP

## 2022-09-01 DIAGNOSIS — M1990 Unspecified osteoarthritis, unspecified site: Secondary | ICD-10-CM | POA: Diagnosis not present

## 2022-09-01 DIAGNOSIS — I10 Essential (primary) hypertension: Secondary | ICD-10-CM | POA: Diagnosis not present

## 2022-09-01 DIAGNOSIS — I69320 Aphasia following cerebral infarction: Secondary | ICD-10-CM | POA: Diagnosis not present

## 2022-09-01 DIAGNOSIS — I350 Nonrheumatic aortic (valve) stenosis: Secondary | ICD-10-CM | POA: Diagnosis not present

## 2022-09-01 DIAGNOSIS — M5013 Cervical disc disorder with radiculopathy, cervicothoracic region: Secondary | ICD-10-CM | POA: Diagnosis not present

## 2022-09-01 DIAGNOSIS — R7303 Prediabetes: Secondary | ICD-10-CM | POA: Diagnosis not present

## 2022-09-01 DIAGNOSIS — Z7902 Long term (current) use of antithrombotics/antiplatelets: Secondary | ICD-10-CM | POA: Diagnosis not present

## 2022-09-01 DIAGNOSIS — D649 Anemia, unspecified: Secondary | ICD-10-CM | POA: Diagnosis not present

## 2022-09-01 DIAGNOSIS — I69351 Hemiplegia and hemiparesis following cerebral infarction affecting right dominant side: Secondary | ICD-10-CM | POA: Diagnosis not present

## 2022-09-01 DIAGNOSIS — R1310 Dysphagia, unspecified: Secondary | ICD-10-CM | POA: Diagnosis not present

## 2022-09-01 DIAGNOSIS — E43 Unspecified severe protein-calorie malnutrition: Secondary | ICD-10-CM | POA: Diagnosis not present

## 2022-09-01 DIAGNOSIS — H409 Unspecified glaucoma: Secondary | ICD-10-CM | POA: Diagnosis not present

## 2022-09-01 DIAGNOSIS — Z8616 Personal history of COVID-19: Secondary | ICD-10-CM | POA: Diagnosis not present

## 2022-09-01 DIAGNOSIS — I472 Ventricular tachycardia, unspecified: Secondary | ICD-10-CM | POA: Diagnosis not present

## 2022-09-01 DIAGNOSIS — M48061 Spinal stenosis, lumbar region without neurogenic claudication: Secondary | ICD-10-CM | POA: Diagnosis not present

## 2022-09-01 DIAGNOSIS — Z8673 Personal history of transient ischemic attack (TIA), and cerebral infarction without residual deficits: Secondary | ICD-10-CM | POA: Diagnosis not present

## 2022-09-01 DIAGNOSIS — Z87442 Personal history of urinary calculi: Secondary | ICD-10-CM | POA: Diagnosis not present

## 2022-09-01 DIAGNOSIS — M5441 Lumbago with sciatica, right side: Secondary | ICD-10-CM | POA: Diagnosis not present

## 2022-09-01 DIAGNOSIS — I69391 Dysphagia following cerebral infarction: Secondary | ICD-10-CM | POA: Diagnosis not present

## 2022-09-01 DIAGNOSIS — E782 Mixed hyperlipidemia: Secondary | ICD-10-CM | POA: Diagnosis not present

## 2022-09-01 DIAGNOSIS — G8929 Other chronic pain: Secondary | ICD-10-CM | POA: Diagnosis not present

## 2022-09-01 DIAGNOSIS — M5442 Lumbago with sciatica, left side: Secondary | ICD-10-CM | POA: Diagnosis not present

## 2022-09-03 DIAGNOSIS — H409 Unspecified glaucoma: Secondary | ICD-10-CM | POA: Diagnosis not present

## 2022-09-03 DIAGNOSIS — I69391 Dysphagia following cerebral infarction: Secondary | ICD-10-CM | POA: Diagnosis not present

## 2022-09-03 DIAGNOSIS — I69351 Hemiplegia and hemiparesis following cerebral infarction affecting right dominant side: Secondary | ICD-10-CM | POA: Diagnosis not present

## 2022-09-03 DIAGNOSIS — R7303 Prediabetes: Secondary | ICD-10-CM | POA: Diagnosis not present

## 2022-09-03 DIAGNOSIS — M5441 Lumbago with sciatica, right side: Secondary | ICD-10-CM | POA: Diagnosis not present

## 2022-09-03 DIAGNOSIS — I472 Ventricular tachycardia, unspecified: Secondary | ICD-10-CM | POA: Diagnosis not present

## 2022-09-03 DIAGNOSIS — Z87442 Personal history of urinary calculi: Secondary | ICD-10-CM | POA: Diagnosis not present

## 2022-09-03 DIAGNOSIS — R1310 Dysphagia, unspecified: Secondary | ICD-10-CM | POA: Diagnosis not present

## 2022-09-03 DIAGNOSIS — Z7902 Long term (current) use of antithrombotics/antiplatelets: Secondary | ICD-10-CM | POA: Diagnosis not present

## 2022-09-03 DIAGNOSIS — Z8616 Personal history of COVID-19: Secondary | ICD-10-CM | POA: Diagnosis not present

## 2022-09-03 DIAGNOSIS — Z8673 Personal history of transient ischemic attack (TIA), and cerebral infarction without residual deficits: Secondary | ICD-10-CM | POA: Diagnosis not present

## 2022-09-03 DIAGNOSIS — M5013 Cervical disc disorder with radiculopathy, cervicothoracic region: Secondary | ICD-10-CM | POA: Diagnosis not present

## 2022-09-03 DIAGNOSIS — I69320 Aphasia following cerebral infarction: Secondary | ICD-10-CM | POA: Diagnosis not present

## 2022-09-03 DIAGNOSIS — I350 Nonrheumatic aortic (valve) stenosis: Secondary | ICD-10-CM | POA: Diagnosis not present

## 2022-09-03 DIAGNOSIS — D649 Anemia, unspecified: Secondary | ICD-10-CM | POA: Diagnosis not present

## 2022-09-03 DIAGNOSIS — I10 Essential (primary) hypertension: Secondary | ICD-10-CM | POA: Diagnosis not present

## 2022-09-03 DIAGNOSIS — M1990 Unspecified osteoarthritis, unspecified site: Secondary | ICD-10-CM | POA: Diagnosis not present

## 2022-09-03 DIAGNOSIS — E782 Mixed hyperlipidemia: Secondary | ICD-10-CM | POA: Diagnosis not present

## 2022-09-03 DIAGNOSIS — M5442 Lumbago with sciatica, left side: Secondary | ICD-10-CM | POA: Diagnosis not present

## 2022-09-03 DIAGNOSIS — M48061 Spinal stenosis, lumbar region without neurogenic claudication: Secondary | ICD-10-CM | POA: Diagnosis not present

## 2022-09-03 DIAGNOSIS — E43 Unspecified severe protein-calorie malnutrition: Secondary | ICD-10-CM | POA: Diagnosis not present

## 2022-09-03 DIAGNOSIS — G8929 Other chronic pain: Secondary | ICD-10-CM | POA: Diagnosis not present

## 2022-09-04 DIAGNOSIS — M5441 Lumbago with sciatica, right side: Secondary | ICD-10-CM | POA: Diagnosis not present

## 2022-09-04 DIAGNOSIS — I10 Essential (primary) hypertension: Secondary | ICD-10-CM | POA: Diagnosis not present

## 2022-09-04 DIAGNOSIS — I69391 Dysphagia following cerebral infarction: Secondary | ICD-10-CM | POA: Diagnosis not present

## 2022-09-04 DIAGNOSIS — G8929 Other chronic pain: Secondary | ICD-10-CM | POA: Diagnosis not present

## 2022-09-04 DIAGNOSIS — I69320 Aphasia following cerebral infarction: Secondary | ICD-10-CM | POA: Diagnosis not present

## 2022-09-04 DIAGNOSIS — Z8616 Personal history of COVID-19: Secondary | ICD-10-CM | POA: Diagnosis not present

## 2022-09-04 DIAGNOSIS — I472 Ventricular tachycardia, unspecified: Secondary | ICD-10-CM | POA: Diagnosis not present

## 2022-09-04 DIAGNOSIS — I350 Nonrheumatic aortic (valve) stenosis: Secondary | ICD-10-CM | POA: Diagnosis not present

## 2022-09-04 DIAGNOSIS — R7303 Prediabetes: Secondary | ICD-10-CM | POA: Diagnosis not present

## 2022-09-04 DIAGNOSIS — M48061 Spinal stenosis, lumbar region without neurogenic claudication: Secondary | ICD-10-CM | POA: Diagnosis not present

## 2022-09-04 DIAGNOSIS — M1990 Unspecified osteoarthritis, unspecified site: Secondary | ICD-10-CM | POA: Diagnosis not present

## 2022-09-04 DIAGNOSIS — E43 Unspecified severe protein-calorie malnutrition: Secondary | ICD-10-CM | POA: Diagnosis not present

## 2022-09-04 DIAGNOSIS — M5442 Lumbago with sciatica, left side: Secondary | ICD-10-CM | POA: Diagnosis not present

## 2022-09-04 DIAGNOSIS — Z7902 Long term (current) use of antithrombotics/antiplatelets: Secondary | ICD-10-CM | POA: Diagnosis not present

## 2022-09-04 DIAGNOSIS — M5013 Cervical disc disorder with radiculopathy, cervicothoracic region: Secondary | ICD-10-CM | POA: Diagnosis not present

## 2022-09-04 DIAGNOSIS — R1310 Dysphagia, unspecified: Secondary | ICD-10-CM | POA: Diagnosis not present

## 2022-09-04 DIAGNOSIS — E782 Mixed hyperlipidemia: Secondary | ICD-10-CM | POA: Diagnosis not present

## 2022-09-04 DIAGNOSIS — D649 Anemia, unspecified: Secondary | ICD-10-CM | POA: Diagnosis not present

## 2022-09-04 DIAGNOSIS — H409 Unspecified glaucoma: Secondary | ICD-10-CM | POA: Diagnosis not present

## 2022-09-04 DIAGNOSIS — I69351 Hemiplegia and hemiparesis following cerebral infarction affecting right dominant side: Secondary | ICD-10-CM | POA: Diagnosis not present

## 2022-09-04 DIAGNOSIS — Z87442 Personal history of urinary calculi: Secondary | ICD-10-CM | POA: Diagnosis not present

## 2022-09-04 DIAGNOSIS — Z8673 Personal history of transient ischemic attack (TIA), and cerebral infarction without residual deficits: Secondary | ICD-10-CM | POA: Diagnosis not present

## 2022-09-08 ENCOUNTER — Telehealth: Payer: Self-pay | Admitting: Family Medicine

## 2022-09-08 DIAGNOSIS — I63512 Cerebral infarction due to unspecified occlusion or stenosis of left middle cerebral artery: Secondary | ICD-10-CM | POA: Diagnosis not present

## 2022-09-08 DIAGNOSIS — Z8616 Personal history of COVID-19: Secondary | ICD-10-CM | POA: Diagnosis not present

## 2022-09-08 DIAGNOSIS — E43 Unspecified severe protein-calorie malnutrition: Secondary | ICD-10-CM | POA: Diagnosis not present

## 2022-09-08 DIAGNOSIS — I69320 Aphasia following cerebral infarction: Secondary | ICD-10-CM | POA: Diagnosis not present

## 2022-09-08 DIAGNOSIS — M5442 Lumbago with sciatica, left side: Secondary | ICD-10-CM | POA: Diagnosis not present

## 2022-09-08 DIAGNOSIS — Z8673 Personal history of transient ischemic attack (TIA), and cerebral infarction without residual deficits: Secondary | ICD-10-CM | POA: Diagnosis not present

## 2022-09-08 DIAGNOSIS — R7303 Prediabetes: Secondary | ICD-10-CM | POA: Diagnosis not present

## 2022-09-08 DIAGNOSIS — G8929 Other chronic pain: Secondary | ICD-10-CM | POA: Diagnosis not present

## 2022-09-08 DIAGNOSIS — I350 Nonrheumatic aortic (valve) stenosis: Secondary | ICD-10-CM | POA: Diagnosis not present

## 2022-09-08 DIAGNOSIS — M48061 Spinal stenosis, lumbar region without neurogenic claudication: Secondary | ICD-10-CM | POA: Diagnosis not present

## 2022-09-08 DIAGNOSIS — H409 Unspecified glaucoma: Secondary | ICD-10-CM | POA: Diagnosis not present

## 2022-09-08 DIAGNOSIS — I10 Essential (primary) hypertension: Secondary | ICD-10-CM | POA: Diagnosis not present

## 2022-09-08 DIAGNOSIS — M5441 Lumbago with sciatica, right side: Secondary | ICD-10-CM | POA: Diagnosis not present

## 2022-09-08 DIAGNOSIS — E782 Mixed hyperlipidemia: Secondary | ICD-10-CM | POA: Diagnosis not present

## 2022-09-08 DIAGNOSIS — M1990 Unspecified osteoarthritis, unspecified site: Secondary | ICD-10-CM | POA: Diagnosis not present

## 2022-09-08 DIAGNOSIS — R1310 Dysphagia, unspecified: Secondary | ICD-10-CM | POA: Diagnosis not present

## 2022-09-08 DIAGNOSIS — I69391 Dysphagia following cerebral infarction: Secondary | ICD-10-CM | POA: Diagnosis not present

## 2022-09-08 DIAGNOSIS — D649 Anemia, unspecified: Secondary | ICD-10-CM | POA: Diagnosis not present

## 2022-09-08 DIAGNOSIS — Z7902 Long term (current) use of antithrombotics/antiplatelets: Secondary | ICD-10-CM | POA: Diagnosis not present

## 2022-09-08 DIAGNOSIS — M5013 Cervical disc disorder with radiculopathy, cervicothoracic region: Secondary | ICD-10-CM | POA: Diagnosis not present

## 2022-09-08 DIAGNOSIS — I472 Ventricular tachycardia, unspecified: Secondary | ICD-10-CM | POA: Diagnosis not present

## 2022-09-08 DIAGNOSIS — I69351 Hemiplegia and hemiparesis following cerebral infarction affecting right dominant side: Secondary | ICD-10-CM | POA: Diagnosis not present

## 2022-09-08 DIAGNOSIS — Z87442 Personal history of urinary calculi: Secondary | ICD-10-CM | POA: Diagnosis not present

## 2022-09-08 NOTE — Telephone Encounter (Signed)
Calling back to add further care giver training on ADL

## 2022-09-08 NOTE — Telephone Encounter (Signed)
Verbal given 

## 2022-09-09 DIAGNOSIS — I63512 Cerebral infarction due to unspecified occlusion or stenosis of left middle cerebral artery: Secondary | ICD-10-CM | POA: Diagnosis not present

## 2022-09-10 DIAGNOSIS — M5442 Lumbago with sciatica, left side: Secondary | ICD-10-CM | POA: Diagnosis not present

## 2022-09-10 DIAGNOSIS — R1310 Dysphagia, unspecified: Secondary | ICD-10-CM | POA: Diagnosis not present

## 2022-09-10 DIAGNOSIS — D649 Anemia, unspecified: Secondary | ICD-10-CM | POA: Diagnosis not present

## 2022-09-10 DIAGNOSIS — Z8616 Personal history of COVID-19: Secondary | ICD-10-CM | POA: Diagnosis not present

## 2022-09-10 DIAGNOSIS — I10 Essential (primary) hypertension: Secondary | ICD-10-CM | POA: Diagnosis not present

## 2022-09-10 DIAGNOSIS — E782 Mixed hyperlipidemia: Secondary | ICD-10-CM | POA: Diagnosis not present

## 2022-09-10 DIAGNOSIS — M1990 Unspecified osteoarthritis, unspecified site: Secondary | ICD-10-CM | POA: Diagnosis not present

## 2022-09-10 DIAGNOSIS — I69351 Hemiplegia and hemiparesis following cerebral infarction affecting right dominant side: Secondary | ICD-10-CM | POA: Diagnosis not present

## 2022-09-10 DIAGNOSIS — I69391 Dysphagia following cerebral infarction: Secondary | ICD-10-CM | POA: Diagnosis not present

## 2022-09-10 DIAGNOSIS — Z7902 Long term (current) use of antithrombotics/antiplatelets: Secondary | ICD-10-CM | POA: Diagnosis not present

## 2022-09-10 DIAGNOSIS — I350 Nonrheumatic aortic (valve) stenosis: Secondary | ICD-10-CM | POA: Diagnosis not present

## 2022-09-10 DIAGNOSIS — M48061 Spinal stenosis, lumbar region without neurogenic claudication: Secondary | ICD-10-CM | POA: Diagnosis not present

## 2022-09-10 DIAGNOSIS — I69320 Aphasia following cerebral infarction: Secondary | ICD-10-CM | POA: Diagnosis not present

## 2022-09-10 DIAGNOSIS — E43 Unspecified severe protein-calorie malnutrition: Secondary | ICD-10-CM | POA: Diagnosis not present

## 2022-09-10 DIAGNOSIS — M5013 Cervical disc disorder with radiculopathy, cervicothoracic region: Secondary | ICD-10-CM | POA: Diagnosis not present

## 2022-09-10 DIAGNOSIS — Z87442 Personal history of urinary calculi: Secondary | ICD-10-CM | POA: Diagnosis not present

## 2022-09-10 DIAGNOSIS — R7303 Prediabetes: Secondary | ICD-10-CM | POA: Diagnosis not present

## 2022-09-10 DIAGNOSIS — M5441 Lumbago with sciatica, right side: Secondary | ICD-10-CM | POA: Diagnosis not present

## 2022-09-10 DIAGNOSIS — Z8673 Personal history of transient ischemic attack (TIA), and cerebral infarction without residual deficits: Secondary | ICD-10-CM | POA: Diagnosis not present

## 2022-09-10 DIAGNOSIS — I472 Ventricular tachycardia, unspecified: Secondary | ICD-10-CM | POA: Diagnosis not present

## 2022-09-10 DIAGNOSIS — H409 Unspecified glaucoma: Secondary | ICD-10-CM | POA: Diagnosis not present

## 2022-09-10 DIAGNOSIS — G8929 Other chronic pain: Secondary | ICD-10-CM | POA: Diagnosis not present

## 2022-09-15 ENCOUNTER — Other Ambulatory Visit: Payer: Self-pay | Admitting: Family Medicine

## 2022-09-15 DIAGNOSIS — Z8673 Personal history of transient ischemic attack (TIA), and cerebral infarction without residual deficits: Secondary | ICD-10-CM

## 2022-09-17 DIAGNOSIS — I69351 Hemiplegia and hemiparesis following cerebral infarction affecting right dominant side: Secondary | ICD-10-CM | POA: Diagnosis not present

## 2022-09-17 DIAGNOSIS — I472 Ventricular tachycardia, unspecified: Secondary | ICD-10-CM | POA: Diagnosis not present

## 2022-09-17 DIAGNOSIS — M1990 Unspecified osteoarthritis, unspecified site: Secondary | ICD-10-CM | POA: Diagnosis not present

## 2022-09-17 DIAGNOSIS — Z7902 Long term (current) use of antithrombotics/antiplatelets: Secondary | ICD-10-CM | POA: Diagnosis not present

## 2022-09-17 DIAGNOSIS — M5441 Lumbago with sciatica, right side: Secondary | ICD-10-CM | POA: Diagnosis not present

## 2022-09-17 DIAGNOSIS — I350 Nonrheumatic aortic (valve) stenosis: Secondary | ICD-10-CM | POA: Diagnosis not present

## 2022-09-17 DIAGNOSIS — I10 Essential (primary) hypertension: Secondary | ICD-10-CM | POA: Diagnosis not present

## 2022-09-17 DIAGNOSIS — M5442 Lumbago with sciatica, left side: Secondary | ICD-10-CM | POA: Diagnosis not present

## 2022-09-17 DIAGNOSIS — H409 Unspecified glaucoma: Secondary | ICD-10-CM | POA: Diagnosis not present

## 2022-09-17 DIAGNOSIS — D649 Anemia, unspecified: Secondary | ICD-10-CM | POA: Diagnosis not present

## 2022-09-17 DIAGNOSIS — G8929 Other chronic pain: Secondary | ICD-10-CM | POA: Diagnosis not present

## 2022-09-17 DIAGNOSIS — E782 Mixed hyperlipidemia: Secondary | ICD-10-CM | POA: Diagnosis not present

## 2022-09-17 DIAGNOSIS — M5013 Cervical disc disorder with radiculopathy, cervicothoracic region: Secondary | ICD-10-CM | POA: Diagnosis not present

## 2022-09-17 DIAGNOSIS — I69320 Aphasia following cerebral infarction: Secondary | ICD-10-CM | POA: Diagnosis not present

## 2022-09-17 DIAGNOSIS — Z87442 Personal history of urinary calculi: Secondary | ICD-10-CM | POA: Diagnosis not present

## 2022-09-17 DIAGNOSIS — R7303 Prediabetes: Secondary | ICD-10-CM | POA: Diagnosis not present

## 2022-09-17 DIAGNOSIS — Z8673 Personal history of transient ischemic attack (TIA), and cerebral infarction without residual deficits: Secondary | ICD-10-CM | POA: Diagnosis not present

## 2022-09-17 DIAGNOSIS — E43 Unspecified severe protein-calorie malnutrition: Secondary | ICD-10-CM | POA: Diagnosis not present

## 2022-09-17 DIAGNOSIS — R1310 Dysphagia, unspecified: Secondary | ICD-10-CM | POA: Diagnosis not present

## 2022-09-17 DIAGNOSIS — M48061 Spinal stenosis, lumbar region without neurogenic claudication: Secondary | ICD-10-CM | POA: Diagnosis not present

## 2022-09-17 DIAGNOSIS — Z8616 Personal history of COVID-19: Secondary | ICD-10-CM | POA: Diagnosis not present

## 2022-09-17 DIAGNOSIS — I69391 Dysphagia following cerebral infarction: Secondary | ICD-10-CM | POA: Diagnosis not present

## 2022-10-02 IMAGING — DX DG LUMBAR SPINE 2-3V
2 series · 2 of 2 positions shown · non-contrast
Comparison: Intraoperative lumbar spine radiographs 01/23/2014
lumbar spine MRI 12/02/2013, CT abdomen/pelvis [DATE]

CLINICAL DATA: Pain

EXAM:
LUMBAR SPINE - 2-3 VIEW

[l-spine ap]
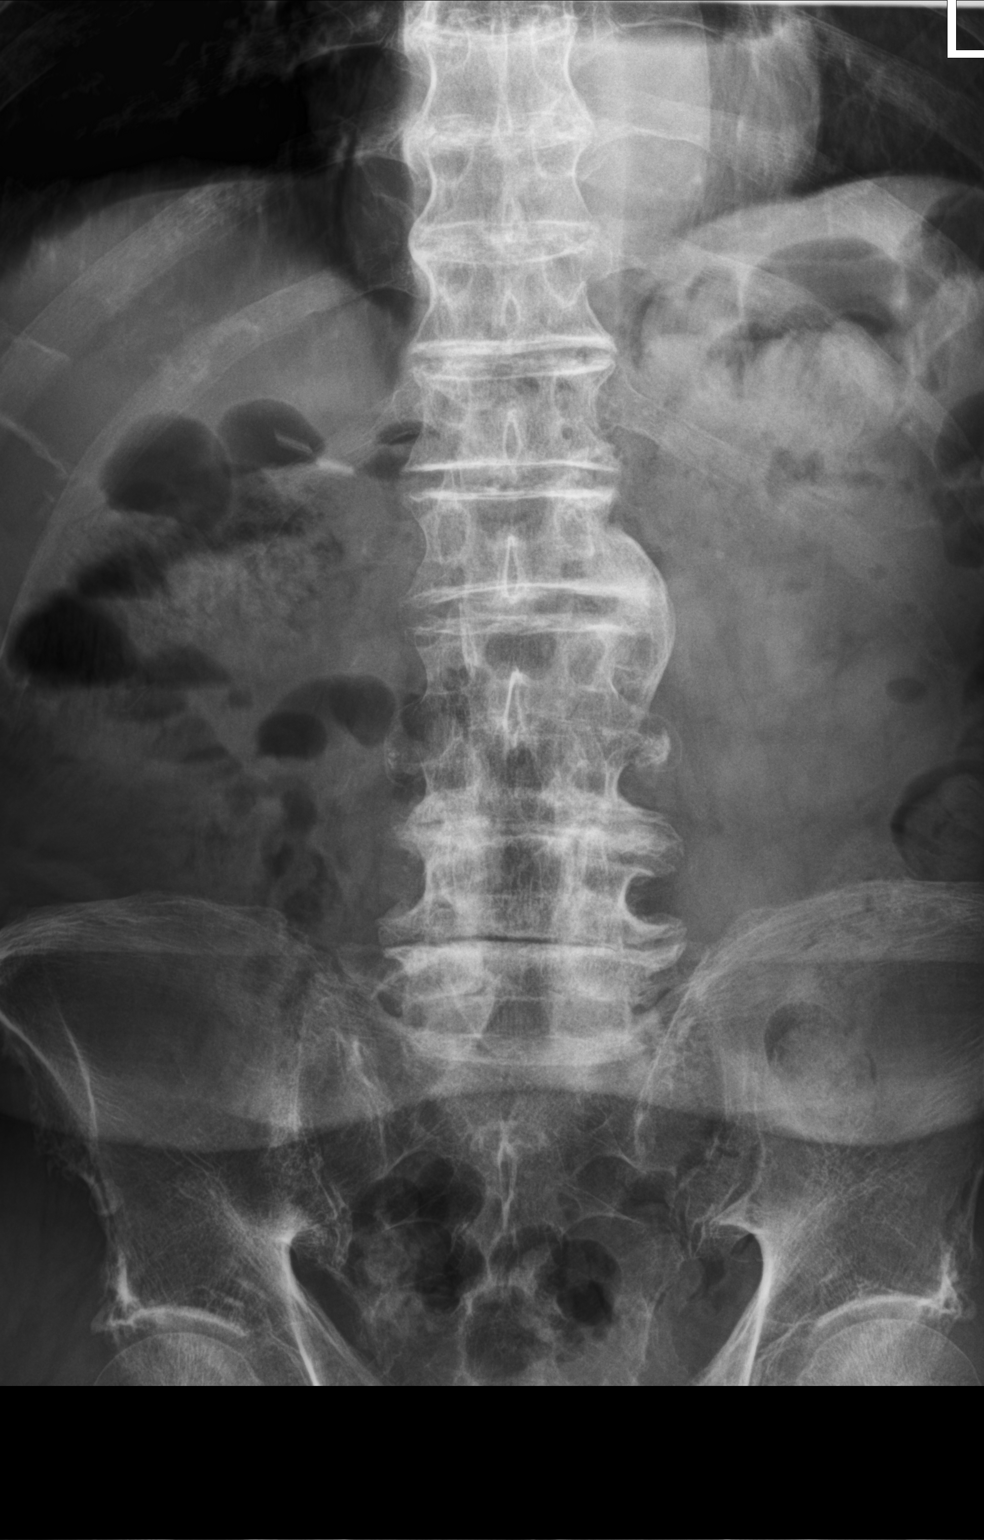

[l-spine lat]
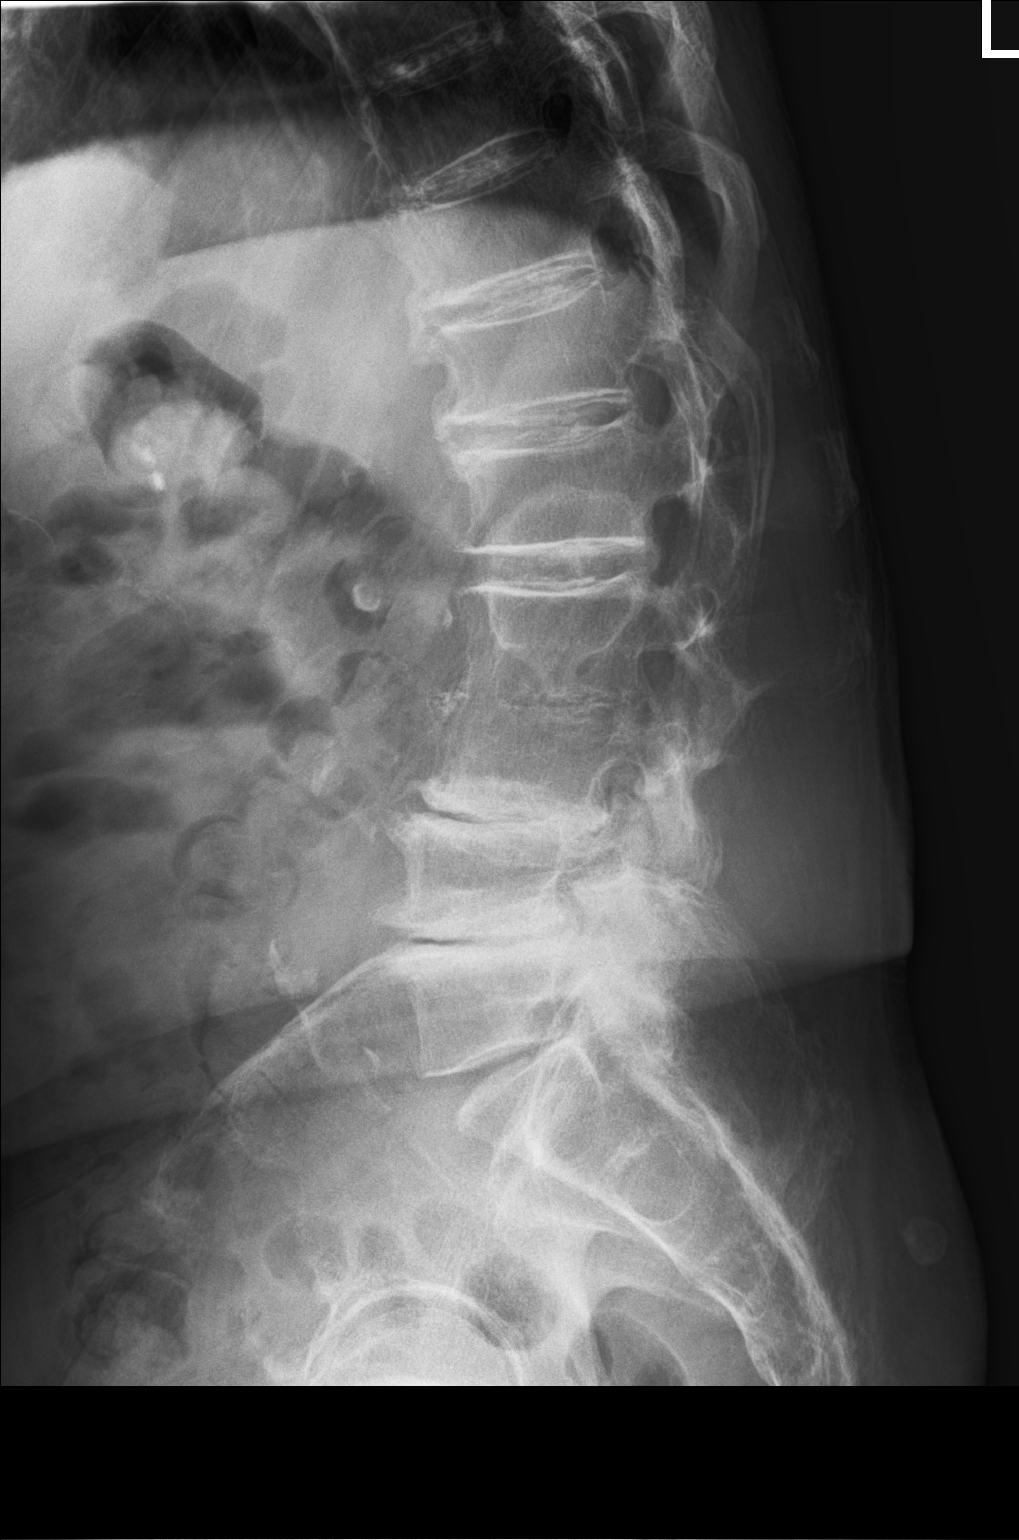

[2 of 2 positions shown; findings below may reference images not displayed]

FINDINGS: There are 5 non-rib-bearing lumbar type vertebral bodies. Vertebral
body heights are preserved, without evidence of acute injury. There
is reversal of the normal lumbar spine curvature at L2. There is
grade 1 retrolisthesis of L3 on L4, similar to the prior MRI from
0865. There is osseous fusion of the L2 and L3 vertebral bodies and
flowing anterior osteophytes in the imaged lower thoracic spine.
There is marked multilevel disc space narrowing at L3-L4 through
L5-S1 with vacuum disc phenomenon at L4-L5. There is associated
degenerative endplate change and advanced facet arthropathy at L4-L5
and L5-S1. Overall, findings appear progressed compared to the
radiographs and MRI from 01/23/2014.

The SI joints are intact.
IMPRESSION: 1. Advanced degenerative changes at L3-L4 through L5-S1 with disc
space narrowing, degenerative endplate change, and facet
arthropathy, overall progressed since [DATE]. Grade 1 retrolisthesis of L3 on L4, similar to 0865.
3. Osseous fusion of the L2 and L3 vertebral bodies, unchanged.
4. Flowing anterior osteophytes in the imaged lower thoracic spine
consistent with diffuse idiopathic skeletal hyperostosis.

## 2022-10-07 ENCOUNTER — Emergency Department (HOSPITAL_COMMUNITY): Payer: Medicare Other

## 2022-10-07 ENCOUNTER — Emergency Department (HOSPITAL_COMMUNITY)
Admission: EM | Admit: 2022-10-07 | Discharge: 2022-10-07 | Disposition: A | Discharge status: Home / Self Care | Payer: Medicare Other | Attending: Emergency Medicine | Admitting: Emergency Medicine

## 2022-10-07 DIAGNOSIS — I10 Essential (primary) hypertension: Secondary | ICD-10-CM | POA: Diagnosis not present

## 2022-10-07 DIAGNOSIS — K3189 Other diseases of stomach and duodenum: Secondary | ICD-10-CM | POA: Diagnosis not present

## 2022-10-07 DIAGNOSIS — Z743 Need for continuous supervision: Secondary | ICD-10-CM | POA: Diagnosis not present

## 2022-10-07 DIAGNOSIS — M542 Cervicalgia: Secondary | ICD-10-CM | POA: Insufficient documentation

## 2022-10-07 DIAGNOSIS — Z7902 Long term (current) use of antithrombotics/antiplatelets: Secondary | ICD-10-CM | POA: Insufficient documentation

## 2022-10-07 DIAGNOSIS — R1084 Generalized abdominal pain: Secondary | ICD-10-CM

## 2022-10-07 DIAGNOSIS — R109 Unspecified abdominal pain: Secondary | ICD-10-CM | POA: Diagnosis not present

## 2022-10-07 DIAGNOSIS — M47812 Spondylosis without myelopathy or radiculopathy, cervical region: Secondary | ICD-10-CM | POA: Diagnosis not present

## 2022-10-07 DIAGNOSIS — K449 Diaphragmatic hernia without obstruction or gangrene: Secondary | ICD-10-CM

## 2022-10-07 LAB — CBC WITH DIFFERENTIAL/PLATELET
Abs Immature Granulocytes: 0.05 10*3/uL (ref 0.00–0.07)
Basophils Absolute: 0 10*3/uL (ref 0.0–0.1)
Basophils Relative: 1 %
Eosinophils Absolute: 0.2 10*3/uL (ref 0.0–0.5)
Eosinophils Relative: 2 %
HCT: 41.8 % (ref 39.0–52.0)
Hemoglobin: 13.2 g/dL (ref 13.0–17.0)
Immature Granulocytes: 1 %
Lymphocytes Relative: 25 %
Lymphs Abs: 1.9 10*3/uL (ref 0.7–4.0)
MCH: 30.3 pg (ref 26.0–34.0)
MCHC: 31.6 g/dL (ref 30.0–36.0)
MCV: 95.9 fL (ref 80.0–100.0)
Monocytes Absolute: 0.8 10*3/uL (ref 0.1–1.0)
Monocytes Relative: 11 %
Neutro Abs: 4.5 10*3/uL (ref 1.7–7.7)
Neutrophils Relative %: 60 %
Platelets: 246 10*3/uL (ref 150–400)
RBC: 4.36 MIL/uL (ref 4.22–5.81)
RDW: 12.9 % (ref 11.5–15.5)
WBC: 7.5 10*3/uL (ref 4.0–10.5)
nRBC: 0 % (ref 0.0–0.2)

## 2022-10-07 LAB — COMPREHENSIVE METABOLIC PANEL
ALT: 9 U/L (ref 0–44)
AST: 21 U/L (ref 15–41)
Albumin: 2.5 g/dL — ABNORMAL LOW (ref 3.5–5.0)
Alkaline Phosphatase: 59 U/L (ref 38–126)
Anion gap: 5 (ref 5–15)
BUN: 16 mg/dL (ref 8–23)
CO2: 20 mmol/L — ABNORMAL LOW (ref 22–32)
Calcium: 6.4 mg/dL — CL (ref 8.9–10.3)
Chloride: 113 mmol/L — ABNORMAL HIGH (ref 98–111)
Creatinine, Ser: 0.82 mg/dL (ref 0.61–1.24)
GFR, Estimated: >60 mL/min (ref >60)
Glucose, Bld: 91 mg/dL (ref 70–99)
Potassium: 3.1 mmol/L — ABNORMAL LOW (ref 3.5–5.1)
Sodium: 138 mmol/L (ref 135–145)
Total Bilirubin: 0.5 mg/dL (ref 0.3–1.2)
Total Protein: 4.7 g/dL — ABNORMAL LOW (ref 6.5–8.1)

## 2022-10-07 LAB — I-STAT CHEM 8, ED
BUN: 17 mg/dL (ref 8–23)
Calcium, Ion: 1.22 mmol/L (ref 1.15–1.40)
Chloride: 99 mmol/L (ref 98–111)
Creatinine, Ser: 1.1 mg/dL (ref 0.61–1.24)
Glucose, Bld: 91 mg/dL (ref 70–99)
HCT: 39 % (ref 39.0–52.0)
Hemoglobin: 13.3 g/dL (ref 13.0–17.0)
Potassium: 4.2 mmol/L (ref 3.5–5.1)
Sodium: 138 mmol/L (ref 135–145)
TCO2: 27 mmol/L (ref 22–32)

## 2022-10-07 LAB — LACTIC ACID, PLASMA: Lactic Acid, Venous: 1.9 mmol/L (ref 0.5–1.9)

## 2022-10-07 LAB — LIPASE, BLOOD: Lipase: 24 U/L (ref 11–51)

## 2022-10-07 MED ORDER — PANTOPRAZOLE SODIUM 20 MG PO TBEC: 20.0000 mg | DELAYED_RELEASE_TABLET | Freq: Every day | ORAL | 2 refills | Status: DC

## 2022-10-07 MED ORDER — SODIUM CHLORIDE 0.9 % IV BOLUS
500.0000 mL | Freq: Once | INTRAVENOUS | Status: AC
  Administered 2022-10-07: 500 mL via INTRAVENOUS

## 2022-10-07 MED ORDER — FENTANYL CITRATE PF 50 MCG/ML IJ SOSY
25.0000 ug | PREFILLED_SYRINGE | Freq: Once | INTRAMUSCULAR | Status: AC
  Administered 2022-10-07: 25 ug via INTRAVENOUS
  Filled 2022-10-07: qty 1

## 2022-10-07 NOTE — ED Notes (Signed)
Paul Loots, MD notified re: Ca level 6.4

## 2022-10-07 NOTE — ED Notes (Signed)
Family updated as to patient's status.

## 2022-10-07 NOTE — ED Provider Notes (Signed)
[place] EMERGENCY DEPARTMENT AT [facility] Provider Note   CSN: [id number] Arrival date & time: 10/07/22  1151     History  Chief Complaint  Patient presents with   Abdominal Pain    Paul Bradshaw is a 89 y.o. male.  HPI Elderly male with multiple medical problems including prior stroke with subsequent dysarthria, right-sided weakness presents with possible abdominal pain, neck pain.  Patient is speech difficulty makes HPI somewhat difficult, and history is also obtained by nursing home notes. Family the patient had some issue with going to the bathroom today, and indicated abdominal pain, neck pain.  He was sent here for evaluation.  Level 5 caveat secondary to speech difficulty.     Home Medications Prior to Admission medications   Medication Sig Start Date End Date Taking? Authorizing Provider  cetirizine (ZYRTEC) 5 MG tablet Take 1 tablet (5 mg total) by mouth daily. 07/11/22   Junie Spencer, FNP  clopidogrel (PLAVIX) 75 MG tablet TAKE ONE TABLET DAILY WITH BREAKFAST 09/15/22   Sonny Masters, FNP  famotidine (PEPCID) 20 MG tablet TAKE ONE TABLET TWICE DAILY 05/02/22   Rakes, Doralee Albino, FNP  latanoprost (XALATAN) 0.005 % ophthalmic solution Place 1 drop into both eyes at bedtime. 07/17/22   [provider]  metoprolol tartrate (LOPRESSOR) 25 MG tablet TAKE 1/2 TABLET TWICE DAILY 07/15/22   Sonny Masters, FNP      Allergies    Amlodipine and Lisinopril    Review of Systems   Review of Systems  Unable to perform ROS: Patient nonverbal    Physical Exam Updated Vital Signs BP (!) 158/70   Pulse 63   Temp 97.8 F (36.6 C) (Oral)   Resp 15   SpO2 98%  Physical Exam Vitals and nursing note reviewed.  Constitutional:      General: He is not in acute distress.    Comments: Chronically ill-appearing elderly male with notable atrophy, right-sided weakness.  HENT:     Head: Normocephalic and atraumatic.  Eyes:     Conjunctiva/sclera: Conjunctivae  normal.  Cardiovascular:     Rate and Rhythm: Normal rate and regular rhythm.  Pulmonary:     Effort: Pulmonary effort is normal. No respiratory distress.     Breath sounds: No stridor.  Abdominal:     General: There is no distension.     Tenderness: There is abdominal tenderness.  Skin:    General: Skin is warm and dry.  Neurological:     Mental Status: He is alert.     Comments: Right-sided hemiparesis, no facial droop, speech is very difficult to understand from dysarthria.  He does move his left side spontaneously.      ED Results / Procedures / Treatments   Labs (all labs ordered are listed, but only abnormal results are displayed) Labs Reviewed  COMPREHENSIVE METABOLIC PANEL - Abnormal; Notable for the following components:      Result Value   Potassium 3.1 (*)    Chloride 113 (*)    CO2 20 (*)    Calcium 6.4 (*)    Total Protein 4.7 (*)    Albumin 2.5 (*)    All other components within normal limits  CBC WITH DIFFERENTIAL/PLATELET  LIPASE, BLOOD  LACTIC ACID, PLASMA  I-STAT CHEM 8, ED    EKG EKG Interpretation  Date/Time:  Tuesday October 07 2022 12:29:47 EDT Ventricular Rate:  72 PR Interval:  233 QRS Duration: 96 QT Interval:  399 QTC Calculation:  437 R Axis:   -73 Text Interpretation: Sinus rhythm Prolonged PR interval Left anterior fascicular block Abnormal R-wave progression, late transition Confirmed by Gerhard Munch [phone]) on 10/07/2022 12:42:45 PM  Radiology No results found.  Procedures Procedures    Medications Ordered in ED Medications  fentaNYL (SUBLIMAZE) injection 25 mcg (25 mcg Intravenous Given 10/07/22 1250)  sodium chloride 0.9 % bolus 500 mL (0 mLs Intravenous Stopped 10/07/22 1501)    ED Course/ Medical Decision Making/ A&P                             Medical Decision Making Elderly male with multiple medical issues including stroke, speech difficulty, right-sided hemiparesis presents with abdominal pain, neck pain.  Patient  is hemodynamically unremarkable, but with difficulty obtaining full history brought differential including intra-abdominal or cervical spine lesions are considered dehydration, other electrolyte abnormalities also considered.  Patient had CT, labs, was monitored.  Cardiac 75 sinus normal Pulse ox 100% room air normal   Amount and/or Complexity of Data Reviewed External Data Reviewed: notes.    Details: Nursing home report Labs: ordered. Decision-making details documented in ED Course. Radiology: ordered and independent interpretation performed. Decision-making details documented in ED Course. ECG/medicine tests: ordered and independent interpretation performed. Decision-making details documented in ED Course.  Risk Prescription drug management. Decision regarding hospitalization.   3:28 PM Patient in no distress, awake, alert.  He is now accompanied by family members.  I reviewed the labs, discussed them with them, as well as plan for discharge if CT imaging is unremarkable. Patient's initial calcium level was low, but ionized calcium is within normal limits and this may be safely followed up with his physician as an outpatient. In essence this elderly male with multiple medical problems including prior stroke, dysarthria, presents with neck and abdominal pain.  He has a nonperitoneal abdomen is afebrile, awake, alert, with reassuring labs, though with consideration of ongoing cervical spine or abdominal pathology is awaiting CT imaging tests, repeat evaluation.        Final Clinical Impression(s) / ED Diagnoses Final diagnoses:  Generalized abdominal pain  Neck pain     Gerhard Munch, MD 10/07/22 1529

## 2022-10-07 NOTE — ED Notes (Signed)
Patient transported to CT 

## 2022-10-07 NOTE — Discharge Instructions (Addendum)
Your evaluation today has been largely reassuring.  But, it is important that you monitor your condition carefully, and do not hesitate to return to the ED if you develop new, or concerning changes in your condition.  Otherwise, please follow-up with your physician for appropriate ongoing care.  In particular, one of your lab values, your calcium level was borderline abnormal.  Please have this repeated with your physician within the next week.

## 2022-10-07 NOTE — ED Triage Notes (Signed)
Pt in from home via [facility], per report the pt was sent here for eval for abd pain after he went to the [facility] where he was attempting to have a BM, pt denies n/v/d, hx of CVA with slurred speech at baseline, denies SOB & CP, EMS reports pt guarding his L flank

## 2022-10-09 ENCOUNTER — Other Ambulatory Visit: Payer: Self-pay | Admitting: Family Medicine

## 2022-10-09 DIAGNOSIS — I63512 Cerebral infarction due to unspecified occlusion or stenosis of left middle cerebral artery: Secondary | ICD-10-CM | POA: Diagnosis not present

## 2022-10-09 DIAGNOSIS — Z8673 Personal history of transient ischemic attack (TIA), and cerebral infarction without residual deficits: Secondary | ICD-10-CM

## 2022-10-10 DIAGNOSIS — I63512 Cerebral infarction due to unspecified occlusion or stenosis of left middle cerebral artery: Secondary | ICD-10-CM | POA: Diagnosis not present

## 2022-10-29 ENCOUNTER — Ambulatory Visit (INDEPENDENT_AMBULATORY_CARE_PROVIDER_SITE_OTHER): Payer: Medicare Other | Admitting: Family Medicine

## 2022-10-29 ENCOUNTER — Encounter: Payer: Self-pay | Admitting: Family Medicine

## 2022-10-29 VITALS — BP 117/65 | HR 57 | Temp 97.6°F

## 2022-10-29 DIAGNOSIS — Z8673 Personal history of transient ischemic attack (TIA), and cerebral infarction without residual deficits: Secondary | ICD-10-CM

## 2022-10-29 DIAGNOSIS — K219 Gastro-esophageal reflux disease without esophagitis: Secondary | ICD-10-CM

## 2022-10-29 DIAGNOSIS — Z87898 Personal history of other specified conditions: Secondary | ICD-10-CM

## 2022-10-29 DIAGNOSIS — H1013 Acute atopic conjunctivitis, bilateral: Secondary | ICD-10-CM | POA: Diagnosis not present

## 2022-10-29 DIAGNOSIS — E782 Mixed hyperlipidemia: Secondary | ICD-10-CM | POA: Diagnosis not present

## 2022-10-29 DIAGNOSIS — I10 Essential (primary) hypertension: Secondary | ICD-10-CM | POA: Diagnosis not present

## 2022-10-29 MED ORDER — CLOPIDOGREL BISULFATE 75 MG PO TABS: ORAL_TABLET | ORAL | 1 refills | Status: DC

## 2022-10-29 MED ORDER — OLOPATADINE HCL 0.2 % OP SOLN: 1.0000 [drp] | Freq: Every day | OPHTHALMIC | 6 refills | Status: DC

## 2022-10-29 MED ORDER — FAMOTIDINE 20 MG PO TABS: 20.0000 mg | ORAL_TABLET | Freq: Two times a day (BID) | ORAL | 1 refills | Status: DC

## 2022-10-29 NOTE — Progress Notes (Signed)
Subjective:  Patient ID: Paul MUNCEY, male    DOB: 05/06/1926, 89 y.o.   MRN: [id number]  Patient Care Team: Sonny Masters, FNP as PCP - General (Family Medicine)   Chief Complaint:  ER follow up (10/07/2022 (5 hours)/[place] [facility]- Abdominal Pain) and Medical Management of Chronic Issues   HPI: Paul Bradshaw is a 89 y.o. male presenting on 10/29/2022 for ER follow up (10/07/2022 (5 hours)/[place] [facility]- Abdominal Pain) and Medical Management of Chronic Issues  Pt presents today for ED discharge follow up. He was seen at [facility] ED 10/07/2022 for abdominal and neck pain. Work up revealed low calcium, potassium, and protein. Imaging was all unremarkable. Family states he has been doing well since ED visit.   1. History of CVA (cerebrovascular accident) Has significant dysarthria and residual right sided weakness. Family states he seems to improve weekly with his movement and speech. He has been able to ambulate with his walker at home.   2. Gastroesophageal reflux disease without esophagitis Placed on PPI therapy during ED visit. Has done well with this. Is still taking his pepcid with Plavix therapy. No melena or hematochezia reported. No hemoptysis.   3. Mixed hyperlipidemia He is not on statin therapy but eats a healthy diet at home.   4. Eye irritation Ongoing redness and irritation to bilateral eyes per family, worse with seasonal changes. No drainage.   5. Hypocalcemia Noted to be low during recent ED visit. Ionized calcium was normal.  6. History of prediabetes Last A1C 5.9. family denies polyuria, polyphagia, or polydipsia.   7. Essential hypertension On lopressor, tolerating well per family.      Relevant past medical, surgical, family, and social history reviewed and updated as indicated.  Allergies and medications reviewed and updated. Data reviewed: Chart in Epic.   Past Medical  History:  Diagnosis Date   Aortic regurgitation    Moderate   Arthritis    BPH (benign prostatic hyperplasia)    Cervical disc disease    Cervical radiculopathy    Hyperlipidemia    Hypertension    Prediabetes 02/16/2021   Staphylococcus aureus bacteremia 08/09/2012   TEE negative for vegetation or thrombus February 2014   Stroke [facility]) 2005 or 2006   3   Symptomatic carotid artery stenosis with infarction [facility]) 2005 or 2006   Status post right carotid endarterectomy    Past Surgical History:  Procedure Laterality Date   BACK SURGERY     CAROTID ENDARTERECTOMY     CATARACT EXTRACTION W/PHACO Left 02/15/2015   Procedure: CATARACT EXTRACTION PHACO AND INTRAOCULAR LENS PLACEMENT LEFT EYE CDE=30.93;  Surgeon: Gemma Payor, MD;  Location: [facility] ORS;  Service: Ophthalmology;  Laterality: Left;   CHOLECYSTECTOMY     EYE SURGERY     KIDNEY STONE SURGERY     LUMBAR LAMINECTOMY/DECOMPRESSION MICRODISCECTOMY Bilateral 01/23/2014   Procedure: LUMBAR LAMINECTOMY/DECOMPRESSION MICRODISCECTOMY 1 LEVEL L5-S1;  Surgeon: Temple Pacini, MD;  Location: [facility] NEURO ORS;  Service: Neurosurgery;  Laterality: Bilateral;  LUMBAR LAMINECTOMY/DECOMPRESSION MICRODISCECTOMY 1 LEVEL L5-S1   TEE WITHOUT CARDIOVERSION N/A 08/12/2012   Procedure: TRANSESOPHAGEAL ECHOCARDIOGRAM (TEE);  Surgeon: Wendall Stade, MD;  Location: [facility] ENDO SUITE;  Service: Cardiovascular;  Laterality: N/A;   TEE WITHOUT CARDIOVERSION N/A 06/24/2013   Procedure: TRANSESOPHAGEAL ECHOCARDIOGRAM (TEE);  Surgeon: Jonelle Sidle, MD;  Location: [facility] ENDO SUITE;  Service: Endoscopy;  Laterality: N/A;    Social History  Socioeconomic History   Marital status: Married    Spouse name: Not on file   Number of children: Not on file   Years of education: Not on file   Highest education level: Not on file  Occupational History   Not on file  Tobacco Use   Smoking status: Never   Smokeless tobacco: Never  Vaping Use   Vaping Use: Never used   Substance and Sexual Activity   Alcohol use: No   Drug use: No   Sexual activity: Not Currently  Other Topics Concern   Not on file  Social History Narrative   Not on file   Social Determinants of Health   Financial Resource Strain: Not on file  Food Insecurity: No Food Insecurity (05/10/2022)   Hunger Vital Sign    Worried About Running Out of Food in the Last Year: Never true    Ran Out of Food in the Last Year: Never true  Transportation Needs: No Transportation Needs (07/22/2022)   PRAPARE - Administrator, Civil Service (Medical): No    Lack of Transportation (Non-Medical): No  Physical Activity: Not on file  Stress: Not on file  Social Connections: Not on file  Intimate Partner Violence: Not At Risk (05/10/2022)   Humiliation, Afraid, Rape, and Kick questionnaire    Fear of Current or Ex-Partner: No    Emotionally Abused: No    Physically Abused: No    Sexually Abused: No    Outpatient Encounter Medications as of 10/29/2022  Medication Sig   cetirizine (ZYRTEC) 5 MG tablet Take 1 tablet (5 mg total) by mouth daily.   latanoprost (XALATAN) 0.005 % ophthalmic solution Place 1 drop into both eyes at bedtime.   metoprolol tartrate (LOPRESSOR) 25 MG tablet TAKE 1/2 TABLET TWICE DAILY   Olopatadine HCl 0.2 % SOLN Apply 1 drop to eye daily.   pantoprazole (PROTONIX) 20 MG tablet Take 1 tablet (20 mg total) by mouth daily.   [DISCONTINUED] clopidogrel (PLAVIX) 75 MG tablet TAKE ONE TABLET DAILY WITH BREAKFAST   [DISCONTINUED] famotidine (PEPCID) 20 MG tablet TAKE ONE TABLET TWICE DAILY   clopidogrel (PLAVIX) 75 MG tablet TAKE ONE TABLET DAILY WITH BREAKFAST   famotidine (PEPCID) 20 MG tablet Take 1 tablet (20 mg total) by mouth 2 (two) times daily.   No facility-administered encounter medications on file as of 10/29/2022.    Allergies  Allergen Reactions   Amlodipine Swelling    Swelling of lips.   Lisinopril Other (See Comments)    Elevated BP higher & causes  a burning feeling on the inside.     Review of Systems  Unable to perform ROS: Other (dysarthria)        Objective:  BP 117/65   Pulse (!) 57   Temp 97.6 F (36.4 C) (Temporal)   SpO2 97%    Wt Readings from Last 3 Encounters:  08/29/22 144 lb (65.3 kg)  07/19/22 141 lb 15.6 oz (64.4 kg)  07/11/22 142 lb (64.4 kg)    Physical Exam Vitals and nursing note reviewed.  Constitutional:      General: He is not in acute distress.    Appearance: He is ill-appearing (chronically ill, right sided weakness). He is not toxic-appearing or diaphoretic.  HENT:     Head: Normocephalic and atraumatic.     Nose: Nose normal.     Mouth/Throat:     Mouth: Mucous membranes are moist.     Pharynx: Oropharynx is clear.  Eyes:  General: Allergic shiner present.        Right eye: No discharge.        Left eye: No discharge.     Conjunctiva/sclera:     Right eye: Right conjunctiva is injected. No exudate.    Left eye: Left conjunctiva is injected. No exudate.    Pupils: Pupils are equal, round, and reactive to light.  Cardiovascular:     Rate and Rhythm: Normal rate and regular rhythm.  Pulmonary:     Effort: Pulmonary effort is normal.     Breath sounds: Normal breath sounds.  Abdominal:     General: Bowel sounds are normal. There is no distension.     Palpations: Abdomen is soft.     Tenderness: There is no abdominal tenderness.  Musculoskeletal:     Cervical back: Normal range of motion and neck supple.     Right lower leg: No edema.     Left lower leg: No edema.  Skin:    General: Skin is warm and dry.     Capillary Refill: Capillary refill takes less than 2 seconds.  Neurological:     Mental Status: He is alert. Mental status is at baseline.     Cranial Nerves: Dysarthria (significant) present.     Motor: Weakness (from prior CVA) and atrophy present.     Gait: Gait abnormal (in wheelchair).     Results for orders placed or performed during the hospital encounter of  10/07/22  Comprehensive metabolic panel  Result Value Ref Range   Sodium 138 135 - 145 mmol/L   Potassium 3.1 (L) 3.5 - 5.1 mmol/L   Chloride 113 (H) 98 - 111 mmol/L   CO2 20 (L) 22 - 32 mmol/L   Glucose, Bld 91 70 - 99 mg/dL   BUN 16 8 - 23 mg/dL   Creatinine, Ser 1.61 0.61 - 1.24 mg/dL   Calcium 6.4 (LL) 8.9 - 10.3 mg/dL   Total Protein 4.7 (L) 6.5 - 8.1 g/dL   Albumin 2.5 (L) 3.5 - 5.0 g/dL   AST 21 15 - 41 U/L   ALT 9 0 - 44 U/L   Alkaline Phosphatase 59 38 - 126 U/L   Total Bilirubin 0.5 0.3 - 1.2 mg/dL   GFR, Estimated >09 >60 mL/min   Anion gap 5 5 - 15  CBC with Differential  Result Value Ref Range   WBC 7.5 4.0 - 10.5 K/uL   RBC 4.36 4.22 - 5.81 MIL/uL   Hemoglobin 13.2 13.0 - 17.0 g/dL   HCT 45.4 09.8 - 11.9 %   MCV 95.9 80.0 - 100.0 fL   MCH 30.3 26.0 - 34.0 pg   MCHC 31.6 30.0 - 36.0 g/dL   RDW 14.7 82.9 - 56.2 %   Platelets 246 150 - 400 K/uL   nRBC 0.0 0.0 - 0.2 %   Neutrophils Relative % 60 %   Neutro Abs 4.5 1.7 - 7.7 K/uL   Lymphocytes Relative 25 %   Lymphs Abs 1.9 0.7 - 4.0 K/uL   Monocytes Relative 11 %   Monocytes Absolute 0.8 0.1 - 1.0 K/uL   Eosinophils Relative 2 %   Eosinophils Absolute 0.2 0.0 - 0.5 K/uL   Basophils Relative 1 %   Basophils Absolute 0.0 0.0 - 0.1 K/uL   Immature Granulocytes 1 %   Abs Immature Granulocytes 0.05 0.00 - 0.07 K/uL  Lipase, blood  Result Value Ref Range   Lipase 24 11 - 51 U/L  Lactic acid,  plasma  Result Value Ref Range   Lactic Acid, Venous 1.9 0.5 - 1.9 mmol/L  I-stat chem 8, ED  Result Value Ref Range   Sodium 138 135 - 145 mmol/L   Potassium 4.2 3.5 - 5.1 mmol/L   Chloride 99 98 - 111 mmol/L   BUN 17 8 - 23 mg/dL   Creatinine, Ser 1.61 0.61 - 1.24 mg/dL   Glucose, Bld 91 70 - 99 mg/dL   Calcium, Ion 0.96 0.45 - 1.40 mmol/L   TCO2 27 22 - 32 mmol/L   Hemoglobin 13.3 13.0 - 17.0 g/dL   HCT 40.9 81.1 - 91.4 %       Pertinent labs & imaging results that were available during my care of the patient  were reviewed by me and considered in my medical decision making.  Assessment & Plan:  Dijuan was seen today for er follow up and medical management of chronic issues.  Diagnoses and all orders for this visit:  Gastroesophageal reflux disease without esophagitis No red flags present. Diet discussed. Avoid fried, spicy, fatty, greasy, and acidic foods. Avoid caffeine, nicotine, and alcohol. Do not eat 2-3 hours before bedtime and stay upright for at least 1-2 hours after eating. Eat small frequent meals. Avoid NSAID's like motrin and aleve. Medications as prescribed. Report any new or worsening symptoms. Follow up as discussed or sooner if needed.   -     famotidine (PEPCID) 20 MG tablet; Take 1 tablet (20 mg total) by mouth 2 (two) times daily. -     CBC with Differential/Platelet  History of CVA (cerebrovascular accident) Improved since last visit. No abnormal bruising or bleeding with plavix.  -     clopidogrel (PLAVIX) 75 MG tablet; TAKE ONE TABLET DAILY WITH BREAKFAST -     CBC with Differential/Platelet -     Lipid panel  Mixed hyperlipidemia Continue with healthy diet choices. Will repeat labs today.  -     CMP14+EGFR -     Lipid panel  Allergic conjunctivitis of both eyes Allergic conjunctivitis. Will trial below to see if beneficial. Family aware to report persistent symptoms.  -     Olopatadine HCl 0.2 % SOLN; Apply 1 drop to eye daily.  Hypocalcemia Will repeat labs and treat if warranted.  -     CMP14+EGFR -     VITAMIN D 25 Hydroxy (Vit-D Deficiency, Fractures)  History of prediabetes Will repeat labs today. Diet encouraged.  -     CMP14+EGFR  Essential hypertension BP well controlled. Changes were not made in regimen today. Goal BP is 130/80. Pt aware to report any persistent high or low readings. DASH diet and exercise encouraged. Exercise at least 150 minutes per week and increase as tolerated. Goal BMI > 25. Stress management encouraged. Avoid nicotine and tobacco  product use. Avoid excessive alcohol and NSAID's. Avoid more than 2000 mg of sodium daily. Medications as prescribed. Follow up as scheduled.  -     CMP14+EGFR -     CBC with Differential/Platelet -     Lipid panel -     Thyroid Panel With TSH     Continue all other maintenance medications.  Follow up plan: Return in 6 months (on 05/01/2023), or if symptoms worsen or fail to improve, for chronic follow up.   Continue healthy lifestyle choices, including diet (rich in fruits, vegetables, and lean proteins, and low in salt and simple carbohydrates) and exercise (at least 30 minutes of moderate physical activity daily).  The above assessment and management plan was discussed with the patient. The patient verbalized understanding of and has agreed to the management plan. Patient is aware to call the clinic if they develop any new symptoms or if symptoms persist or worsen. Patient is aware when to return to the clinic for a follow-up visit. Patient educated on when it is appropriate to go to the emergency department.   Monia Pouch, FNP-C [facility] [phone]

## 2022-10-30 LAB — THYROID PANEL WITH TSH
Free Thyroxine Index: 2.2 (ref 1.2–4.9)
T3 Uptake Ratio: 32 % (ref 24–39)
T4, Total: 6.8 ug/dL (ref 4.5–12.0)
TSH: 3.18 u[IU]/mL (ref 0.450–4.500)

## 2022-10-30 LAB — CMP14+EGFR
ALT: 12 IU/L (ref 0–44)
AST: 23 IU/L (ref 0–40)
Albumin/Globulin Ratio: 1.9 (ref 1.2–2.2)
Albumin: 3.9 g/dL (ref 3.6–4.6)
Alkaline Phosphatase: 84 IU/L (ref 44–121)
BUN/Creatinine Ratio: 15 (ref 10–24)
BUN: 16 mg/dL (ref 10–36)
Bilirubin Total: 0.4 mg/dL (ref 0.0–1.2)
CO2: 25 mmol/L (ref 20–29)
Calcium: 9 mg/dL (ref 8.6–10.2)
Chloride: 102 mmol/L (ref 96–106)
Creatinine, Ser: 1.09 mg/dL (ref 0.76–1.27)
Globulin, Total: 2.1 g/dL (ref 1.5–4.5)
Glucose: 90 mg/dL (ref 70–99)
Potassium: 4.6 mmol/L (ref 3.5–5.2)
Sodium: 141 mmol/L (ref 134–144)
Total Protein: 6 g/dL (ref 6.0–8.5)
eGFR: 62 mL/min/{1.73_m2} (ref >59)

## 2022-10-30 LAB — CBC WITH DIFFERENTIAL/PLATELET
Basophils Absolute: 0 10*3/uL (ref 0.0–0.2)
Basos: 1 %
EOS (ABSOLUTE): 0.2 10*3/uL (ref 0.0–0.4)
Eos: 3 %
Hematocrit: 38.4 % (ref 37.5–51.0)
Hemoglobin: 12.5 g/dL — ABNORMAL LOW (ref 13.0–17.7)
Immature Grans (Abs): 0 10*3/uL (ref 0.0–0.1)
Immature Granulocytes: 1 %
Lymphocytes Absolute: 1.6 10*3/uL (ref 0.7–3.1)
Lymphs: 26 %
MCH: 30.5 pg (ref 26.6–33.0)
MCHC: 32.6 g/dL (ref 31.5–35.7)
MCV: 94 fL (ref 79–97)
Monocytes Absolute: 0.6 10*3/uL (ref 0.1–0.9)
Monocytes: 9 %
Neutrophils Absolute: 3.6 10*3/uL (ref 1.4–7.0)
Neutrophils: 60 %
Platelets: 221 10*3/uL (ref 150–450)
RBC: 4.1 x10E6/uL — ABNORMAL LOW (ref 4.14–5.80)
RDW: 12.8 % (ref 11.6–15.4)
WBC: 6 10*3/uL (ref 3.4–10.8)

## 2022-10-30 LAB — VITAMIN D 25 HYDROXY (VIT D DEFICIENCY, FRACTURES): Vit D, 25-Hydroxy: 55.4 ng/mL (ref 30.0–100.0)

## 2022-10-30 LAB — LIPID PANEL
Chol/HDL Ratio: 2.6 ratio (ref 0.0–5.0)
Cholesterol, Total: 141 mg/dL (ref 100–199)
HDL: 55 mg/dL (ref >39)
LDL Chol Calc (NIH): 70 mg/dL (ref 0–99)
Triglycerides: 83 mg/dL (ref 0–149)
VLDL Cholesterol Cal: 16 mg/dL (ref 5–40)

## 2022-11-08 DIAGNOSIS — I63512 Cerebral infarction due to unspecified occlusion or stenosis of left middle cerebral artery: Secondary | ICD-10-CM | POA: Diagnosis not present

## 2022-11-09 DIAGNOSIS — I63512 Cerebral infarction due to unspecified occlusion or stenosis of left middle cerebral artery: Secondary | ICD-10-CM | POA: Diagnosis not present

## 2022-11-18 DIAGNOSIS — K219 Gastro-esophageal reflux disease without esophagitis: Secondary | ICD-10-CM | POA: Diagnosis not present

## 2022-11-18 DIAGNOSIS — Z515 Encounter for palliative care: Secondary | ICD-10-CM | POA: Diagnosis not present

## 2022-11-18 DIAGNOSIS — Z8673 Personal history of transient ischemic attack (TIA), and cerebral infarction without residual deficits: Secondary | ICD-10-CM | POA: Diagnosis not present

## 2022-11-18 DIAGNOSIS — I69391 Dysphagia following cerebral infarction: Secondary | ICD-10-CM | POA: Diagnosis not present

## 2022-11-18 DIAGNOSIS — R052 Subacute cough: Secondary | ICD-10-CM | POA: Diagnosis not present

## 2022-11-18 DIAGNOSIS — I69351 Hemiplegia and hemiparesis following cerebral infarction affecting right dominant side: Secondary | ICD-10-CM | POA: Diagnosis not present

## 2022-11-24 ENCOUNTER — Other Ambulatory Visit: Payer: Self-pay | Admitting: Family Medicine

## 2022-11-24 DIAGNOSIS — N401 Enlarged prostate with lower urinary tract symptoms: Secondary | ICD-10-CM

## 2022-11-26 ENCOUNTER — Telehealth: Payer: Self-pay | Admitting: Family Medicine

## 2022-11-26 ENCOUNTER — Other Ambulatory Visit: Payer: Self-pay | Admitting: Family Medicine

## 2022-11-26 DIAGNOSIS — H409 Unspecified glaucoma: Secondary | ICD-10-CM

## 2022-11-26 NOTE — Telephone Encounter (Signed)
Was advised by the eye dr that PCP needed to send referral to [facility] in [place].

## 2022-11-26 NOTE — Telephone Encounter (Signed)
NA

## 2022-11-26 NOTE — Telephone Encounter (Signed)
  Incoming Patient Call  11/26/2022  What symptoms do you have? Left eye red and hurting. Son wants to know if they increase the eye drop or change it. It is getting irritated around eyelash.  How long have you been sick? One week  Have you been seen for this problem? YES  If your provider decides to give you a prescription, which pharmacy would you like for it to be sent to? [facility]  Patient informed that this information will be sent to the clinical staff for review and that they should receive a follow up call.

## 2022-11-26 NOTE — Telephone Encounter (Signed)
Needs to reach out to eye doctor or make an appointment to be evaluated.

## 2022-11-26 NOTE — Telephone Encounter (Signed)
Son aware and verbalizes understanding.  

## 2022-11-26 NOTE — Telephone Encounter (Signed)
Referral was placed, son can call to schedule an appointment.

## 2022-12-09 DIAGNOSIS — I63512 Cerebral infarction due to unspecified occlusion or stenosis of left middle cerebral artery: Secondary | ICD-10-CM | POA: Diagnosis not present

## 2022-12-10 DIAGNOSIS — I63512 Cerebral infarction due to unspecified occlusion or stenosis of left middle cerebral artery: Secondary | ICD-10-CM | POA: Diagnosis not present

## 2022-12-11 DIAGNOSIS — B351 Tinea unguium: Secondary | ICD-10-CM | POA: Diagnosis not present

## 2022-12-11 DIAGNOSIS — M79676 Pain in unspecified toe(s): Secondary | ICD-10-CM | POA: Diagnosis not present

## 2022-12-18 DIAGNOSIS — R059 Cough, unspecified: Secondary | ICD-10-CM | POA: Diagnosis not present

## 2022-12-18 DIAGNOSIS — I69351 Hemiplegia and hemiparesis following cerebral infarction affecting right dominant side: Secondary | ICD-10-CM | POA: Diagnosis not present

## 2022-12-18 DIAGNOSIS — K219 Gastro-esophageal reflux disease without esophagitis: Secondary | ICD-10-CM | POA: Diagnosis not present

## 2022-12-18 DIAGNOSIS — R062 Wheezing: Secondary | ICD-10-CM | POA: Diagnosis not present

## 2022-12-18 DIAGNOSIS — Z515 Encounter for palliative care: Secondary | ICD-10-CM | POA: Diagnosis not present

## 2022-12-18 DIAGNOSIS — Z8673 Personal history of transient ischemic attack (TIA), and cerebral infarction without residual deficits: Secondary | ICD-10-CM | POA: Diagnosis not present

## 2022-12-18 DIAGNOSIS — I69391 Dysphagia following cerebral infarction: Secondary | ICD-10-CM | POA: Diagnosis not present

## 2022-12-18 DIAGNOSIS — F32A Depression, unspecified: Secondary | ICD-10-CM | POA: Diagnosis not present

## 2022-12-22 ENCOUNTER — Other Ambulatory Visit: Payer: Self-pay | Admitting: Family Medicine

## 2023-01-07 ENCOUNTER — Other Ambulatory Visit: Payer: Self-pay | Admitting: Family

## 2023-01-07 DIAGNOSIS — J069 Acute upper respiratory infection, unspecified: Secondary | ICD-10-CM

## 2023-01-08 ENCOUNTER — Other Ambulatory Visit: Payer: Self-pay | Admitting: Family

## 2023-01-12 ENCOUNTER — Telehealth: Payer: Self-pay | Admitting: Family Medicine

## 2023-01-12 NOTE — Telephone Encounter (Signed)
Aware and verbalizes understanding.  

## 2023-01-12 NOTE — Telephone Encounter (Signed)
Calling to report that the patient is having increased anxiety and confusion along with refusing food and medications. He has started getting up in the middle of the night which is not normal for the patient. Stated that they are wanting to put the patient on anxiety medication but Clydie Braun thinks that something else may be going on such as a UTI. Patient also has a history of strokes. Please call her back to advise.

## 2023-01-14 ENCOUNTER — Ambulatory Visit (INDEPENDENT_AMBULATORY_CARE_PROVIDER_SITE_OTHER): Payer: Medicare Other

## 2023-01-14 ENCOUNTER — Encounter: Payer: Self-pay | Admitting: Family Medicine

## 2023-01-14 ENCOUNTER — Ambulatory Visit (INDEPENDENT_AMBULATORY_CARE_PROVIDER_SITE_OTHER): Payer: Medicare Other | Admitting: Family Medicine

## 2023-01-14 VITALS — BP 115/72 | HR 75 | Wt 144.0 lb

## 2023-01-14 DIAGNOSIS — R410 Disorientation, unspecified: Secondary | ICD-10-CM

## 2023-01-14 DIAGNOSIS — R062 Wheezing: Secondary | ICD-10-CM

## 2023-01-14 DIAGNOSIS — F419 Anxiety disorder, unspecified: Secondary | ICD-10-CM

## 2023-01-14 DIAGNOSIS — R300 Dysuria: Secondary | ICD-10-CM | POA: Diagnosis not present

## 2023-01-14 DIAGNOSIS — F5101 Primary insomnia: Secondary | ICD-10-CM

## 2023-01-14 DIAGNOSIS — I700 Atherosclerosis of aorta: Secondary | ICD-10-CM | POA: Diagnosis not present

## 2023-01-14 MED ORDER — HYDROXYZINE HCL 10 MG PO TABS
10.0000 mg | ORAL_TABLET | Freq: Every day | ORAL | 0 refills | Status: DC
Start: 2023-01-14 — End: 2024-04-25

## 2023-01-14 NOTE — Progress Notes (Signed)
Subjective:  Patient ID: Paul Bradshaw, male    DOB: 1925/10/31, 89 y.o.   MRN: [id number]  Patient Care Team: Sonny Masters, FNP as PCP - General (Family Medicine)   Chief Complaint:  Urinary Incontinence and Agitation (Worse in pm)   HPI: Paul Bradshaw is a 89 y.o. male presenting on 01/14/2023 for Urinary Incontinence and Agitation (Worse in pm)   Caregiver presents today with pt for increased anxiety, confusion, and insomnia. States over the last several weeks he has been staying up at night and sleeping during the day. He has been more anxious and states he is having trouble breathing at times. No fever, chills, cough, or sputum production. She states he also has issues with voiding at times. Would like to be checked for UTI.      Relevant past medical, surgical, family, and social history reviewed and updated as indicated.  Allergies and medications reviewed and updated. Data reviewed: Chart in Epic.   Past Medical History:  Diagnosis Date   Aortic regurgitation    Moderate   Arthritis    BPH (benign prostatic hyperplasia)    Cervical disc disease    Cervical radiculopathy    Hyperlipidemia    Hypertension    Prediabetes 02/16/2021   Staphylococcus aureus bacteremia 08/09/2012   TEE negative for vegetation or thrombus February 2014   Stroke [facility] 2005 or 2006   3   Symptomatic carotid artery stenosis with infarction [facility]) 2005 or 2006   Status post right carotid endarterectomy    Past Surgical History:  Procedure Laterality Date   BACK SURGERY     CAROTID ENDARTERECTOMY     CATARACT EXTRACTION W/PHACO Left 02/15/2015   Procedure: CATARACT EXTRACTION PHACO AND INTRAOCULAR LENS PLACEMENT LEFT EYE CDE=30.93;  Surgeon: Gemma Payor, MD;  Location: [facility] ORS;  Service: Ophthalmology;  Laterality: Left;   CHOLECYSTECTOMY     EYE SURGERY     KIDNEY STONE SURGERY     LUMBAR LAMINECTOMY/DECOMPRESSION MICRODISCECTOMY Bilateral 01/23/2014   Procedure: LUMBAR  LAMINECTOMY/DECOMPRESSION MICRODISCECTOMY 1 LEVEL L5-S1;  Surgeon: Temple Pacini, MD;  Location: [facility] NEURO ORS;  Service: Neurosurgery;  Laterality: Bilateral;  LUMBAR LAMINECTOMY/DECOMPRESSION MICRODISCECTOMY 1 LEVEL L5-S1   TEE WITHOUT CARDIOVERSION N/A 08/12/2012   Procedure: TRANSESOPHAGEAL ECHOCARDIOGRAM (TEE);  Surgeon: Wendall Stade, MD;  Location: [facility] ENDO SUITE;  Service: Cardiovascular;  Laterality: N/A;   TEE WITHOUT CARDIOVERSION N/A 06/24/2013   Procedure: TRANSESOPHAGEAL ECHOCARDIOGRAM (TEE);  Surgeon: Jonelle Sidle, MD;  Location: [facility] ENDO SUITE;  Service: Endoscopy;  Laterality: N/A;    Social History   Socioeconomic History   Marital status: Married    Spouse name: Not on file   Number of children: Not on file   Years of education: Not on file   Highest education level: Not on file  Occupational History   Not on file  Tobacco Use   Smoking status: Never   Smokeless tobacco: Never  Vaping Use   Vaping status: Never Used  Substance and Sexual Activity   Alcohol use: No   Drug use: No   Sexual activity: Not Currently  Other Topics Concern   Not on file  Social History Narrative   Not on file   Social Determinants of Health   Financial Resource Strain: Not on file  Food Insecurity: No Food Insecurity (05/10/2022)   Hunger Vital Sign    Worried About Running Out of Food in the Last Year: Never true    Ran  Out of Food in the Last Year: Never true  Transportation Needs: No Transportation Needs (07/22/2022)   PRAPARE - Administrator, Civil Service (Medical): No    Lack of Transportation (Non-Medical): No  Physical Activity: Not on file  Stress: Not on file  Social Connections: Unknown (11/02/2021)   Received from [facility]   Social Network    Social Network: Not on file  Intimate Partner Violence: Not At Risk (05/10/2022)   Humiliation, Afraid, Rape, and Kick questionnaire    Fear of Current or Ex-Partner: No    Emotionally Abused: No     Physically Abused: No    Sexually Abused: No    Outpatient Encounter Medications as of 01/14/2023  Medication Sig   cetirizine (ZYRTEC) 5 MG tablet TAKE 1 TABLET DAILY   clopidogrel (PLAVIX) 75 MG tablet TAKE ONE TABLET DAILY WITH BREAKFAST   famotidine (PEPCID) 20 MG tablet Take 1 tablet (20 mg total) by mouth 2 (two) times daily.   hydrOXYzine (ATARAX) 10 MG tablet Take 1 tablet (10 mg total) by mouth at bedtime.   latanoprost (XALATAN) 0.005 % ophthalmic solution Place 1 drop into both eyes at bedtime.   metoprolol tartrate (LOPRESSOR) 25 MG tablet TAKE 1/2 TABLET TWICE DAILY   Olopatadine HCl 0.2 % SOLN Apply 1 drop to eye daily.   pantoprazole (PROTONIX) 20 MG tablet TAKE ONE TABLET BY MOUTH DAILY   No facility-administered encounter medications on file as of 01/14/2023.    Allergies  Allergen Reactions   Amlodipine Swelling    Swelling of lips.   Lisinopril Other (See Comments)    Elevated BP higher & causes a burning feeling on the inside.     Review of Systems  Unable to perform ROS: Dementia (ROS per caregiver)  Constitutional:  Positive for activity change, appetite change and fatigue. Negative for chills, diaphoresis, fever and unexpected weight change.  HENT: Negative.    Eyes: Negative.   Endocrine: Negative.   Genitourinary:  Positive for dysuria.  Skin: Negative.   Allergic/Immunologic: Negative.   Hematological: Negative.   Psychiatric/Behavioral:  Positive for agitation, confusion and sleep disturbance. The patient is nervous/anxious.   All other systems reviewed and are negative.       Objective:  BP 115/72   Pulse 75   Wt 144 lb (65.3 kg)   SpO2 97%   BMI 21.90 kg/m    Wt Readings from Last 3 Encounters:  01/14/23 144 lb (65.3 kg)  08/29/22 144 lb (65.3 kg)  07/19/22 141 lb 15.6 oz (64.4 kg)    Physical Exam Vitals and nursing note reviewed.  Constitutional:      General: He is not in acute distress.    Appearance: He is ill-appearing  (chronically, right sided weakness). He is not toxic-appearing or diaphoretic.  HENT:     Head: Normocephalic and atraumatic.     Mouth/Throat:     Mouth: Mucous membranes are moist.  Eyes:     Conjunctiva/sclera: Conjunctivae normal.     Pupils: Pupils are equal, round, and reactive to light.  Cardiovascular:     Rate and Rhythm: Normal rate and regular rhythm.     Heart sounds: Normal heart sounds.  Pulmonary:     Effort: Pulmonary effort is normal. No respiratory distress.     Breath sounds: No stridor. Wheezing present. No rhonchi or rales.  Chest:     Chest wall: No tenderness.  Skin:    General: Skin is warm and dry.  Capillary Refill: Capillary refill takes less than 2 seconds.  Neurological:     Mental Status: He is alert.     Cranial Nerves: Dysarthria (prior CVA) present.     Motor: Weakness (right sided, prior CVA) present.     Gait: Gait abnormal (in wheelchair).  Psychiatric:        Attention and Perception: Attention normal.     Results for orders placed or performed in visit on 10/29/22  CMP14+EGFR  Result Value Ref Range   Glucose 90 70 - 99 mg/dL   BUN 16 10 - 36 mg/dL   Creatinine, Ser 5.63 0.76 - 1.27 mg/dL   eGFR 62 >87 FI/EPP/2.95   BUN/Creatinine Ratio 15 10 - 24   Sodium 141 134 - 144 mmol/L   Potassium 4.6 3.5 - 5.2 mmol/L   Chloride 102 96 - 106 mmol/L   CO2 25 20 - 29 mmol/L   Calcium 9.0 8.6 - 10.2 mg/dL   Total Protein 6.0 6.0 - 8.5 g/dL   Albumin 3.9 3.6 - 4.6 g/dL   Globulin, Total 2.1 1.5 - 4.5 g/dL   Albumin/Globulin Ratio 1.9 1.2 - 2.2   Bilirubin Total 0.4 0.0 - 1.2 mg/dL   Alkaline Phosphatase 84 44 - 121 IU/L   AST 23 0 - 40 IU/L   ALT 12 0 - 44 IU/L  CBC with Differential/Platelet  Result Value Ref Range   WBC 6.0 3.4 - 10.8 x10E3/uL   RBC 4.10 (L) 4.14 - 5.80 x10E6/uL   Hemoglobin 12.5 (L) 13.0 - 17.7 g/dL   Hematocrit 18.8 41.6 - 51.0 %   MCV 94 79 - 97 fL   MCH 30.5 26.6 - 33.0 pg   MCHC 32.6 31.5 - 35.7 g/dL   RDW  60.6 30.1 - 60.1 %   Platelets 221 150 - 450 x10E3/uL   Neutrophils 60 Not Estab. %   Lymphs 26 Not Estab. %   Monocytes 9 Not Estab. %   Eos 3 Not Estab. %   Basos 1 Not Estab. %   Neutrophils Absolute 3.6 1.4 - 7.0 x10E3/uL   Lymphocytes Absolute 1.6 0.7 - 3.1 x10E3/uL   Monocytes Absolute 0.6 0.1 - 0.9 x10E3/uL   EOS (ABSOLUTE) 0.2 0.0 - 0.4 x10E3/uL   Basophils Absolute 0.0 0.0 - 0.2 x10E3/uL   Immature Granulocytes 1 Not Estab. %   Immature Grans (Abs) 0.0 0.0 - 0.1 x10E3/uL  Lipid panel  Result Value Ref Range   Cholesterol, Total 141 100 - 199 mg/dL   Triglycerides 83 0 - 149 mg/dL   HDL 55 >09 mg/dL   VLDL Cholesterol Cal 16 5 - 40 mg/dL   LDL Chol Calc (NIH) 70 0 - 99 mg/dL   Chol/HDL Ratio 2.6 0.0 - 5.0 ratio  Thyroid Panel With TSH  Result Value Ref Range   TSH 3.180 0.450 - 4.500 uIU/mL   T4, Total 6.8 4.5 - 12.0 ug/dL   T3 Uptake Ratio 32 24 - 39 %   Free Thyroxine Index 2.2 1.2 - 4.9  VITAMIN D 25 Hydroxy (Vit-D Deficiency, Fractures)  Result Value Ref Range   Vit D, 25-Hydroxy 55.4 30.0 - 100.0 ng/mL     X-Ray: CXR: No acute findings. Preliminary x-ray reading by Kari Baars, FNP-C, [facility].   Pertinent labs & imaging results that were available during my care of the patient were reviewed by me and considered in my medical decision making.  Assessment & Plan:  Waylyn was seen today for urinary incontinence  and agitation.  Diagnoses and all orders for this visit:  Dysuria Unable to provide urine in office. Will obtain specimen at home and bring back for analysis. -     Urinalysis, Routine w reflex microscopic -     Urine Culture  Confusion Increased over last several days. Imaging, urinalysis, and CXR to evaluate for underlying causes. Likely age related. Further treatment if warranted.  -     DG Chest 2 View; Future -     BMP8+EGFR -     CBC with Differential/Platelet  Wheezing CXR without acute findings. Labs pending.  -     DG Chest 2 View;  Future -     BMP8+EGFR -     CBC with Differential/Platelet  Anxiousness Primary insomnia Increasing over the last several weeks. [facility] nurse requested something to help with anxiety and sleep. Will try low dose atarax. Caregiver aware of sedation precautions.  -     hydrOXYzine (ATARAX) 10 MG tablet; Take 1 tablet (10 mg total) by mouth at bedtime.     Continue all other maintenance medications.  Follow up plan: Return if symptoms worsen or fail to improve.   Continue healthy lifestyle choices, including diet (rich in fruits, vegetables, and lean proteins, and low in salt and simple carbohydrates) and exercise (at least 30 minutes of moderate physical activity daily).   The above assessment and management plan was discussed with the patient. The patient verbalized understanding of and has agreed to the management plan. Patient is aware to call the clinic if they develop any new symptoms or if symptoms persist or worsen. Patient is aware when to return to the clinic for a follow-up visit. Patient educated on when it is appropriate to go to the emergency department.   Kari Baars, FNP-C [facility] [phone]

## 2023-01-15 ENCOUNTER — Other Ambulatory Visit: Payer: Medicare Other

## 2023-01-15 DIAGNOSIS — R300 Dysuria: Secondary | ICD-10-CM | POA: Diagnosis not present

## 2023-01-15 LAB — BMP8+EGFR
BUN/Creatinine Ratio: 15 (ref 10–24)
BUN: 18 mg/dL (ref 10–36)
Chloride: 101 mmol/L (ref 96–106)
Creatinine, Ser: 1.17 mg/dL (ref 0.76–1.27)
Glucose: 164 mg/dL — ABNORMAL HIGH (ref 70–99)
Potassium: 4.2 mmol/L (ref 3.5–5.2)
Sodium: 141 mmol/L (ref 134–144)
eGFR: 57 mL/min/{1.73_m2} — ABNORMAL LOW (ref >59)

## 2023-01-15 LAB — URINALYSIS, ROUTINE W REFLEX MICROSCOPIC
Bilirubin, UA: NEGATIVE
Glucose, UA: NEGATIVE
Ketones, UA: NEGATIVE
Leukocytes,UA: NEGATIVE
Nitrite, UA: NEGATIVE
Protein,UA: NEGATIVE
RBC, UA: NEGATIVE
Specific Gravity, UA: 1.02 (ref 1.005–1.030)
Urobilinogen, Ur: 0.2 mg/dL (ref 0.2–1.0)
pH, UA: 5.5 (ref 5.0–7.5)

## 2023-01-15 LAB — CBC WITH DIFFERENTIAL/PLATELET
Basophils Absolute: 0 10*3/uL (ref 0.0–0.2)
Basos: 1 %
EOS (ABSOLUTE): 0.2 10*3/uL (ref 0.0–0.4)
Eos: 3 %
Hematocrit: 40.3 % (ref 37.5–51.0)
Hemoglobin: 12.9 g/dL — ABNORMAL LOW (ref 13.0–17.7)
Immature Grans (Abs): 0 10*3/uL (ref 0.0–0.1)
Immature Granulocytes: 1 %
Lymphocytes Absolute: 1.9 10*3/uL (ref 0.7–3.1)
Lymphs: 35 %
MCH: 30.9 pg (ref 26.6–33.0)
MCHC: 32 g/dL (ref 31.5–35.7)
MCV: 97 fL (ref 79–97)
Monocytes Absolute: 0.5 10*3/uL (ref 0.1–0.9)
Monocytes: 9 %
Neutrophils: 51 %
Platelets: 227 10*3/uL (ref 150–450)
RBC: 4.17 x10E6/uL (ref 4.14–5.80)
RDW: 12.9 % (ref 11.6–15.4)
WBC: 5.5 10*3/uL (ref 3.4–10.8)

## 2023-01-19 LAB — SPECIMEN STATUS REPORT

## 2023-01-19 LAB — HGB A1C W/O EAG: Hgb A1c MFr Bld: 5.6 % (ref 4.8–5.6)

## 2023-01-20 ENCOUNTER — Other Ambulatory Visit: Payer: Self-pay | Admitting: Family Medicine

## 2023-01-20 DIAGNOSIS — N401 Enlarged prostate with lower urinary tract symptoms: Secondary | ICD-10-CM

## 2023-01-21 DIAGNOSIS — I69351 Hemiplegia and hemiparesis following cerebral infarction affecting right dominant side: Secondary | ICD-10-CM | POA: Diagnosis not present

## 2023-01-21 DIAGNOSIS — Z8673 Personal history of transient ischemic attack (TIA), and cerebral infarction without residual deficits: Secondary | ICD-10-CM | POA: Diagnosis not present

## 2023-01-21 DIAGNOSIS — F32A Depression, unspecified: Secondary | ICD-10-CM | POA: Diagnosis not present

## 2023-01-21 DIAGNOSIS — Z515 Encounter for palliative care: Secondary | ICD-10-CM | POA: Diagnosis not present

## 2023-01-21 DIAGNOSIS — R451 Restlessness and agitation: Secondary | ICD-10-CM | POA: Diagnosis not present

## 2023-02-18 DIAGNOSIS — H401134 Primary open-angle glaucoma, bilateral, indeterminate stage: Secondary | ICD-10-CM | POA: Diagnosis not present

## 2023-02-26 ENCOUNTER — Telehealth: Payer: Self-pay | Admitting: Family Medicine

## 2023-02-26 NOTE — Telephone Encounter (Signed)
Son states that since patients stroke last year he needs help with speech and OT.  Aware this may have to wait until provider comes in next week.  Wanted me to send to covering provider to see if referral can be placed?

## 2023-02-26 NOTE — Telephone Encounter (Signed)
Pts son, Chrys, called stating that pt needs Speech Therapy and OT if possible. Says he was doing speech therapy before the therapist they had wasn't affective but whoever the pt had at [facility] that came to his home, worked wonders with him.   Please advise.

## 2023-03-03 NOTE — Telephone Encounter (Signed)
Son aware and verbalizes understanding per dpr. 

## 2023-03-05 DIAGNOSIS — Z515 Encounter for palliative care: Secondary | ICD-10-CM | POA: Diagnosis not present

## 2023-03-05 DIAGNOSIS — I69322 Dysarthria following cerebral infarction: Secondary | ICD-10-CM | POA: Diagnosis not present

## 2023-03-05 DIAGNOSIS — I69398 Other sequelae of cerebral infarction: Secondary | ICD-10-CM | POA: Diagnosis not present

## 2023-03-05 DIAGNOSIS — I69311 Memory deficit following cerebral infarction: Secondary | ICD-10-CM | POA: Diagnosis not present

## 2023-03-05 DIAGNOSIS — I69351 Hemiplegia and hemiparesis following cerebral infarction affecting right dominant side: Secondary | ICD-10-CM | POA: Diagnosis not present

## 2023-03-10 ENCOUNTER — Other Ambulatory Visit: Payer: Self-pay | Admitting: Family Medicine

## 2023-04-06 ENCOUNTER — Other Ambulatory Visit: Payer: Self-pay | Admitting: Family Medicine

## 2023-05-01 ENCOUNTER — Encounter: Payer: Self-pay | Admitting: Family Medicine

## 2023-05-01 ENCOUNTER — Telehealth (INDEPENDENT_AMBULATORY_CARE_PROVIDER_SITE_OTHER): Payer: Medicare Other | Admitting: Family Medicine

## 2023-05-01 ENCOUNTER — Ambulatory Visit: Payer: Medicare Other | Admitting: Family Medicine

## 2023-05-01 DIAGNOSIS — Z515 Encounter for palliative care: Secondary | ICD-10-CM | POA: Diagnosis not present

## 2023-05-01 NOTE — Progress Notes (Signed)
Virtual Visit via Video   I connected with patient on 05/01/23 at 1115 by a video enabled telemedicine application and verified that I am speaking with the correct person using two identifiers.  Location patient: Home Location provider: [facility] Office Persons participating in the virtual visit: Patient and Provider  I discussed the limitations of evaluation and management by telemedicine and the availability of in person appointments. The patient expressed understanding and agreed to proceed.  Subjective:   HPI:  Pt presents today for  Chief Complaint  Patient presents with   Follow-up   Discussed the use of AI scribe software for clinical note transcription with the patient, who gave verbal consent to proceed.  History of Present Illness   Paul Bradshaw, a patient under hospice care, has been reported to be generally well but with periods of confusion and sleepiness. His medications have remained unchanged, and he has not exhibited any adverse reactions or complications. Hospice care has been consistent, with a doctor visiting weekly, CNAs on Tuesdays and Thursdays, a chaplain biweekly, a social worker monthly, and physical therapy on Fridays.  Paul Bradshaw has been reported to have bathroom needs during the consultation, but no further details were provided regarding urinary or bowel habits. His medications are being managed and refilled by the hospice care team, and they are delivered from [facility]. No new symptoms or changes in his condition were reported during the consultation.       ROS - unable to prepare due to dementia   Patient Active Problem List   Diagnosis Date Noted   Sepsis (HCC) 07/19/2022   CAP (community acquired pneumonia) 07/19/2022   SVT (supraventricular tachycardia) (HCC) 05/10/2022   Failure to thrive in adult 04/30/2022   Bedridden 04/11/2022   Decreased hearing, bilateral 03/16/2022   COVID-19 03/16/2022   Prerenal azotemia  03/16/2022   Anemia 03/16/2022   Dysphagia due to recent stroke 03/12/2022   Aphasia due to recent cerebral infarction 03/12/2022   Protein-calorie malnutrition, severe 02/21/2022   Acute ischemic left middle cerebral artery (MCA) stroke (HCC) 02/17/2022   Acute ischemic stroke (HCC) 02/07/2022   Pressure injury of skin 02/07/2022   Essential hypertension 02/07/2022   Glaucoma 02/07/2022   Chronic midline low back pain with bilateral sciatica 11/19/2021   Controlled substance agreement signed 11/19/2021   Aortic atherosclerosis (HCC) 08/20/2021   Nephrolithiasis 05/06/2021   Leukocytosis 05/06/2021   History of prediabetes 02/16/2021   White coat syndrome without hypertension    Risk for falls 05/27/2019   History of CVA (cerebrovascular accident) 10/23/2016   HNP (herniated nucleus pulposus), lumbar 01/23/2014   Lumbar stenosis 01/23/2014   BPH (benign prostatic hyperplasia) 10/14/2013   Mixed hyperlipidemia 10/14/2013   Carotid artery disease (HCC) 09/22/2011    Social History   Tobacco Use   Smoking status: Never   Smokeless tobacco: Never  Substance Use Topics   Alcohol use: No    Current Outpatient Medications:    cetirizine (ZYRTEC) 5 MG tablet, TAKE 1 TABLET DAILY, Disp: 90 tablet, Rfl: 1   clopidogrel (PLAVIX) 75 MG tablet, TAKE ONE TABLET DAILY WITH BREAKFAST, Disp: 90 tablet, Rfl: 1   famotidine (PEPCID) 20 MG tablet, Take 1 tablet (20 mg total) by mouth 2 (two) times daily., Disp: 180 tablet, Rfl: 1   hydrOXYzine (ATARAX) 10 MG tablet, Take 1 tablet (10 mg total) by mouth at bedtime., Disp: 30 tablet, Rfl: 0   latanoprost (XALATAN) 0.005 % ophthalmic solution, Place 1 drop into both  eyes at bedtime., Disp: , Rfl:    metoprolol tartrate (LOPRESSOR) 25 MG tablet, TAKE 1/2 TABLET TWICE DAILY, Disp: 60 tablet, Rfl: 1   Olopatadine HCl 0.2 % SOLN, Apply 1 drop to eye daily., Disp: 2.5 mL, Rfl: 6   pantoprazole (PROTONIX) 20 MG tablet, TAKE ONE TABLET BY MOUTH DAILY,  Disp: 30 tablet, Rfl: 2   QUEtiapine (SEROQUEL) 25 MG tablet, TAKE ONE TABLET BY MOUTH ONCE DAILY, Disp: 30 tablet, Rfl: 0  Allergies  Allergen Reactions   Amlodipine Swelling    Swelling of lips.   Lisinopril Other (See Comments)    Elevated BP higher & causes a burning feeling on the inside.     Objective:   There were no vitals taken for this visit.  Patient is well-developed, well-nourished in no acute distress.  Resting comfortably at home.  Head is normocephalic, atraumatic.  No labored breathing.  Patient is at baseline  Assessment and Plan:   Paul Bradshaw was seen today for follow-up.  Diagnoses and all orders for this visit:  Hospice care patient  Assessment and Plan    Hospice Care Management Paul Bradshaw is under hospice care with a multidisciplinary team including a physician, CNAs, chaplain, social worker, and physical therapist. The family is effectively managing his care with hospice support. Benadryl is approved for use as needed. Medication refills are managed by [facility]. - Continue current hospice care schedule - Ensure medication refills are managed by the hospice team - Request hospice team to send visit notes.         Return if symptoms worsen or fail to improve.  Kari Baars, FNP-C [facility] [address] [place], [place] [address] [phone]  05/01/2023  Time spent with the patient: 15 minutes, of which >50% was spent in obtaining information about symptoms, reviewing previous labs, evaluations, and treatments, counseling about condition (please see the discussed topics above), and developing a plan to further investigate it; had a number of questions which I addressed.

## 2023-08-14 ENCOUNTER — Telehealth: Payer: Self-pay | Admitting: Family Medicine

## 2023-08-17 NOTE — Telephone Encounter (Signed)
 AS of today, death certificate not on [place], [place] to [employer] home. It has not been sent yet.

## 2023-08-22 NOTE — Telephone Encounter (Signed)
 Copied from CRM [id number]. Topic: General - Deceased Patient >> 09/07/2023  1:59 PM Clayton Bibles wrote: Name of caller: Irving Burton with [facility]   Date of death: 09-07-23   Name of funeral home: [facility]  Phone number of funeral home: [phone]  Provider that needs to sign form: Gilford Silvius, FNP  Timeline for signing: Unknown

## 2023-08-22 DEATH — deceased
